# Patient Record
Sex: Female | Born: 1940
Health system: Southern US, Community
[De-identification: ages and names within clinical notes are randomized; demographics above are authoritative.]

## PROBLEM LIST (undated history)

## (undated) DIAGNOSIS — M25471 Effusion, right ankle: Secondary | ICD-10-CM

## (undated) DIAGNOSIS — E785 Hyperlipidemia, unspecified: Secondary | ICD-10-CM

## (undated) DIAGNOSIS — R011 Cardiac murmur, unspecified: Secondary | ICD-10-CM

## (undated) DIAGNOSIS — K219 Gastro-esophageal reflux disease without esophagitis: Secondary | ICD-10-CM

## (undated) DIAGNOSIS — IMO0002 Reserved for concepts with insufficient information to code with codable children: Secondary | ICD-10-CM

## (undated) DIAGNOSIS — IMO0001 Reserved for inherently not codable concepts without codable children: Secondary | ICD-10-CM

## (undated) DIAGNOSIS — R519 Headache, unspecified: Secondary | ICD-10-CM

## (undated) DIAGNOSIS — I679 Cerebrovascular disease, unspecified: Secondary | ICD-10-CM

## (undated) DIAGNOSIS — F32A Depression, unspecified: Secondary | ICD-10-CM

## (undated) DIAGNOSIS — I739 Peripheral vascular disease, unspecified: Secondary | ICD-10-CM

## (undated) DIAGNOSIS — I340 Nonrheumatic mitral (valve) insufficiency: Secondary | ICD-10-CM

## (undated) DIAGNOSIS — J302 Other seasonal allergic rhinitis: Secondary | ICD-10-CM

## (undated) DIAGNOSIS — I10 Essential (primary) hypertension: Secondary | ICD-10-CM

## (undated) DIAGNOSIS — R55 Syncope and collapse: Secondary | ICD-10-CM

## (undated) DIAGNOSIS — M549 Dorsalgia, unspecified: Secondary | ICD-10-CM

## (undated) DIAGNOSIS — Z9889 Other specified postprocedural states: Secondary | ICD-10-CM

## (undated) DIAGNOSIS — D649 Anemia, unspecified: Secondary | ICD-10-CM

## (undated) DIAGNOSIS — I809 Phlebitis and thrombophlebitis of unspecified site: Secondary | ICD-10-CM

## (undated) DIAGNOSIS — R51 Headache: Secondary | ICD-10-CM

## (undated) DIAGNOSIS — R42 Dizziness and giddiness: Secondary | ICD-10-CM

## (undated) DIAGNOSIS — B019 Varicella without complication: Secondary | ICD-10-CM

## (undated) DIAGNOSIS — M79606 Pain in leg, unspecified: Secondary | ICD-10-CM

## (undated) DIAGNOSIS — I82409 Acute embolism and thrombosis of unspecified deep veins of unspecified lower extremity: Secondary | ICD-10-CM

## (undated) DIAGNOSIS — I2510 Atherosclerotic heart disease of native coronary artery without angina pectoris: Secondary | ICD-10-CM

## (undated) DIAGNOSIS — R112 Nausea with vomiting, unspecified: Secondary | ICD-10-CM

## (undated) DIAGNOSIS — F419 Anxiety disorder, unspecified: Secondary | ICD-10-CM

## (undated) DIAGNOSIS — T7840XA Allergy, unspecified, initial encounter: Secondary | ICD-10-CM

## (undated) DIAGNOSIS — I6529 Occlusion and stenosis of unspecified carotid artery: Secondary | ICD-10-CM

## (undated) DIAGNOSIS — F329 Major depressive disorder, single episode, unspecified: Secondary | ICD-10-CM

## (undated) DIAGNOSIS — M25472 Effusion, left ankle: Secondary | ICD-10-CM

## (undated) HISTORY — DX: Peripheral vascular disease, unspecified: I73.9

## (undated) HISTORY — DX: Dorsalgia, unspecified: M54.9

## (undated) HISTORY — DX: Syncope and collapse: R55

## (undated) HISTORY — DX: Major depressive disorder, single episode, unspecified: F32.9

## (undated) HISTORY — DX: Allergy, unspecified, initial encounter: T78.40XA

## (undated) HISTORY — DX: Pain in leg, unspecified: M79.606

## (undated) HISTORY — DX: Atherosclerotic heart disease of native coronary artery without angina pectoris: I25.10

## (undated) HISTORY — DX: Reserved for concepts with insufficient information to code with codable children: IMO0002

## (undated) HISTORY — DX: Phlebitis and thrombophlebitis of unspecified site: I80.9

## (undated) HISTORY — DX: Hyperlipidemia, unspecified: E78.5

## (undated) HISTORY — DX: Acute embolism and thrombosis of unspecified deep veins of unspecified lower extremity: I82.409

## (undated) HISTORY — PX: CHOLECYSTECTOMY: SHX55

## (undated) HISTORY — PX: UPPER GASTROINTESTINAL ENDOSCOPY: SHX188

## (undated) HISTORY — PX: CARDIAC CATHETERIZATION: SHX172

## (undated) HISTORY — PX: UPPER GI ENDOSCOPY: SHX6162

## (undated) HISTORY — DX: Varicella without complication: B01.9

## (undated) HISTORY — DX: Dizziness and giddiness: R42

## (undated) HISTORY — DX: Anemia, unspecified: D64.9

## (undated) HISTORY — PX: BREAST SURGERY: SHX581

## (undated) HISTORY — DX: Occlusion and stenosis of unspecified carotid artery: I65.29

## (undated) HISTORY — DX: Cerebrovascular disease, unspecified: I67.9

## (undated) HISTORY — PX: APPENDECTOMY: SHX54

## (undated) HISTORY — DX: Essential (primary) hypertension: I10

## (undated) HISTORY — PX: TOTAL ABDOMINAL HYSTERECTOMY: SHX209

## (undated) HISTORY — DX: Depression, unspecified: F32.A

## (undated) HISTORY — PX: SEPTOPLASTY: SUR1290

## (undated) HISTORY — DX: Cardiac murmur, unspecified: R01.1

---

## 1990-08-31 HISTORY — PX: CAROTID ENDARTERECTOMY: SUR193

## 2003-06-12 ENCOUNTER — Encounter: Payer: Self-pay | Admitting: Cardiothoracic Surgery

## 2003-06-14 ENCOUNTER — Encounter: Payer: Self-pay | Admitting: Cardiothoracic Surgery

## 2003-06-14 ENCOUNTER — Inpatient Hospital Stay (HOSPITAL_COMMUNITY): Admission: RE | Admit: 2003-06-14 | Discharge: 2003-06-20 | Payer: Self-pay | Admitting: Cardiothoracic Surgery

## 2003-06-15 ENCOUNTER — Encounter: Payer: Self-pay | Admitting: Cardiothoracic Surgery

## 2003-06-16 ENCOUNTER — Encounter: Payer: Self-pay | Admitting: Cardiothoracic Surgery

## 2003-06-17 ENCOUNTER — Encounter: Payer: Self-pay | Admitting: Cardiothoracic Surgery

## 2003-06-18 ENCOUNTER — Encounter: Payer: Self-pay | Admitting: Surgery

## 2003-06-19 ENCOUNTER — Encounter: Payer: Self-pay | Admitting: Cardiothoracic Surgery

## 2003-07-06 ENCOUNTER — Encounter: Admission: RE | Admit: 2003-07-06 | Discharge: 2003-07-06 | Payer: Self-pay | Admitting: Cardiothoracic Surgery

## 2004-08-31 HISTORY — PX: CORONARY ARTERY BYPASS GRAFT: SHX141

## 2005-01-29 ENCOUNTER — Encounter: Admission: RE | Admit: 2005-01-29 | Discharge: 2005-01-29 | Payer: Self-pay | Admitting: Vascular Surgery

## 2005-02-04 ENCOUNTER — Ambulatory Visit (HOSPITAL_COMMUNITY): Admission: RE | Admit: 2005-02-04 | Discharge: 2005-02-04 | Payer: Self-pay | Admitting: Vascular Surgery

## 2005-02-13 ENCOUNTER — Inpatient Hospital Stay (HOSPITAL_COMMUNITY): Admission: RE | Admit: 2005-02-13 | Discharge: 2005-02-15 | Payer: Self-pay | Admitting: Vascular Surgery

## 2005-02-13 ENCOUNTER — Encounter (INDEPENDENT_AMBULATORY_CARE_PROVIDER_SITE_OTHER): Payer: Self-pay | Admitting: Specialist

## 2005-02-13 HISTORY — PX: CAROTID ENDARTERECTOMY: SUR193

## 2006-07-13 ENCOUNTER — Ambulatory Visit: Payer: Self-pay | Admitting: Ophthalmology

## 2007-04-18 ENCOUNTER — Ambulatory Visit: Payer: Self-pay | Admitting: Vascular Surgery

## 2007-09-07 ENCOUNTER — Ambulatory Visit (HOSPITAL_COMMUNITY): Admission: RE | Admit: 2007-09-07 | Discharge: 2007-09-07 | Payer: Self-pay | Admitting: *Deleted

## 2008-04-27 ENCOUNTER — Ambulatory Visit: Payer: Self-pay | Admitting: Vascular Surgery

## 2008-08-10 ENCOUNTER — Ambulatory Visit (HOSPITAL_BASED_OUTPATIENT_CLINIC_OR_DEPARTMENT_OTHER): Admission: RE | Admit: 2008-08-10 | Discharge: 2008-08-10 | Payer: Self-pay | Admitting: Otolaryngology

## 2009-03-18 ENCOUNTER — Ambulatory Visit: Payer: Self-pay

## 2009-05-03 ENCOUNTER — Ambulatory Visit: Payer: Self-pay | Admitting: Vascular Surgery

## 2009-09-16 ENCOUNTER — Emergency Department: Payer: Self-pay | Admitting: Emergency Medicine

## 2009-09-18 ENCOUNTER — Encounter: Payer: Self-pay | Admitting: Cardiovascular Disease

## 2009-10-08 ENCOUNTER — Ambulatory Visit: Payer: Self-pay | Admitting: Cardiovascular Disease

## 2009-10-08 DIAGNOSIS — Z8669 Personal history of other diseases of the nervous system and sense organs: Secondary | ICD-10-CM | POA: Insufficient documentation

## 2009-10-08 DIAGNOSIS — I739 Peripheral vascular disease, unspecified: Secondary | ICD-10-CM | POA: Insufficient documentation

## 2009-10-08 DIAGNOSIS — I2510 Atherosclerotic heart disease of native coronary artery without angina pectoris: Secondary | ICD-10-CM | POA: Insufficient documentation

## 2009-10-08 DIAGNOSIS — E785 Hyperlipidemia, unspecified: Secondary | ICD-10-CM | POA: Insufficient documentation

## 2009-10-08 DIAGNOSIS — I10 Essential (primary) hypertension: Secondary | ICD-10-CM | POA: Insufficient documentation

## 2009-10-08 DIAGNOSIS — I25810 Atherosclerosis of coronary artery bypass graft(s) without angina pectoris: Secondary | ICD-10-CM | POA: Insufficient documentation

## 2009-10-08 DIAGNOSIS — Z8672 Personal history of thrombophlebitis: Secondary | ICD-10-CM | POA: Insufficient documentation

## 2009-10-09 ENCOUNTER — Encounter: Payer: Self-pay | Admitting: Cardiovascular Disease

## 2009-11-05 ENCOUNTER — Ambulatory Visit: Payer: Self-pay | Admitting: Vascular Surgery

## 2010-02-13 ENCOUNTER — Encounter: Payer: Self-pay | Admitting: Cardiovascular Disease

## 2010-03-05 ENCOUNTER — Telehealth: Payer: Self-pay | Admitting: Cardiovascular Disease

## 2010-03-10 LAB — HM MAMMOGRAPHY: HM Mammogram: NORMAL

## 2010-04-21 ENCOUNTER — Ambulatory Visit: Payer: Self-pay | Admitting: Cardiovascular Disease

## 2010-06-03 ENCOUNTER — Ambulatory Visit: Payer: Self-pay | Admitting: Vascular Surgery

## 2010-09-02 ENCOUNTER — Encounter: Payer: Self-pay | Admitting: Cardiovascular Disease

## 2010-09-30 NOTE — Assessment & Plan Note (Signed)
Summary: f85m   Visit Type:  Follow-up Primary Provider:  Ddr. Loma Sender  CC:  F/U.  "Doing well.".  History of Present Illness: [name] is a very pleasant 70 year old woman with history coronary artery disease, bypass surgery, peripheral vascular disease with history of right carotid endarterectomy followed by Dr. Arbie Cookey, who presents for routine followup. Last catheterization in January 2009 showing patent vein grafts, atretic LIMA to the LAD.   Catherine Green reports that she has been doing well. She denies any significant chest pain. She has been exercising with a trainer in an effort to lose weight. She has been cutting back on her simvastatin from 40-20 mg daily to 2 malaise. Otherwise she is feeling well. She does report having some fatigue and wonders if her weight gain may be due to her medications  She states that she sees Dr.Early every 6 months and had a carotid ultrasound at that time.   EKG shows normal sinus rhythm with rate of 54 beats per minute, no significant ST or T wave changes.  She reports that her last cholesterol was 160 on simvastatin 40 mg daily. She has recently decreased her dose to 20 mg daily.   Current Medications (verified): 1)  Hydrochlorothiazide 50 Mg Tabs (Hydrochlorothiazide) .... Take One Tablet By Mouth Daily. 2)  Aspirin 81 Mg Tbec (Aspirin) .... Take 2 Tablets By Mouth Daily 3)  Furosemide 20 Mg Tabs (Furosemide) .... As Needed 4)  Alprazolam 0.5 Mg Tabs (Alprazolam) .Marland Kitchen.. 1 Tab By Mouth Two Times A Day As Needed 5)  Carvedilol 25 Mg Tabs (Carvedilol) .... 1/2  Tablet By Mouth Twice A Day 6)  Simvastatin 40 Mg Tabs (Simvastatin) .... Take One Tablet By Mouth Daily At Bedtime 7)  Lisinopril 5 Mg Tabs (Lisinopril) .Marland Kitchen.. 1 Tab By Mouth Once Daily 8)  Butalbital-Asa-Caffeine 50-325-40 Mg Caps (Butalbital-Aspirin-Caffeine) .... As Needed  Allergies (verified): 1)  ! Lipitor 2)  ! Crestor 3)  ! Oxycodone Hcl 4)  ! Codeine  Past  History:  Past Medical History: Last updated: Nov 02, 2009 hyperlipidemia PVD- endarterectomy by Dr. Arbie Cookey CAD s/p bypass hypertension presyncope hx thrombophlebitis following childbirth  Extracranial cerebrovascular occlusive disease  Past Surgical History: Last updated: 11-02-09 endarterectomy by Dr. Arbie Cookey redo 2006 bypass - Dr. Donata Clay 2006 Septoplasty with bilateral inferior turbinate reductions Total abdominal hysterectomy Appendectomy  Family History: Last updated: 2009-11-02 Father:Deceased at 60 years old of heart disease Mother: Deceased 73 years old aneurysm on the brain  Brother: Deceased at 23 years old heart disease. Family History of Hypertension:   Social History: Last updated: 11/02/09 Retired  Married  Tobacco Use - Former.  Alcohol Use - no Regular Exercise - yes Drug Use - no  Risk Factors: Alcohol Use: 0 (11-02-09) Caffeine Use: 1 cup of coffee a day (2009-11-02) Exercise: yes (11/02/09)  Risk Factors: Smoking Status: quit (11/02/2009)  Review of Systems       The patient complains of weight gain.  The patient denies fever, weight loss, vision loss, decreased hearing, hoarseness, chest pain, syncope, dyspnea on exertion, peripheral edema, prolonged cough, abdominal pain, incontinence, muscle weakness, depression, and enlarged lymph nodes.         Fatigue,   Vital Signs:  Patient profile:   70 year old female Height:      62 inches Weight:      158 pounds BMI:     29.00 Pulse rate:   52 / minute BP sitting:   164 / 76  (left  arm) Cuff size:   regular  Vitals Entered By: Bishop Dublin, CMA (April 21, 2010 11:25 AM)  Physical Exam  General:  well-appearing woman in no apparent distress. HEENT exam is benign, oropharynx is clear. Neck is supple with no JVP. 2+ carotid bruit on the right. Heart sounds are regular with S1-S2 and no murmurs appreciated. lungs are clear to auscultation with no wheezes or rales. Abdominal exam is  benign. No significant lower extremity edema. Pulses are equal and symmetrical in her upper and lower extremities. Skin is warm and dry. Neurologic exam is nonfocal.   Impression & Recommendations:  Problem # 1:  CAD, ARTERY BYPASS GRAFT (ICD-414.04) no symptoms concerning for angina. No testing scheduled at this time. We have asked her to contact us if she has any worsening shortness of breath or chest discomfort. We will continue aggressive medical management.  Her updated medication list for this problem includes:    Aspirin 81 Mg Tbec (Aspirin) .Marland Kitchen... Take 2 tablets by mouth daily    Carvedilol 6.25 Mg Tabs (Carvedilol) .Marland Kitchen... Take 1 tablet by mouth two times a day    Lisinopril 5 Mg Tabs (Lisinopril) .Marland Kitchen... 1 tab by mouth once daily  Problem # 2:  HYPERLIPIDEMIA-MIXED (ICD-272.4) Her cholesterol continues to be elevated above goal. She has even decreased her dose from 40 to 20 mg daily. We have asked her to retry the 40 mg dose.  Her updated medication list for this problem includes:    Simvastatin 40 Mg Tabs (Simvastatin) .Marland Kitchen... Take one tablet by mouth daily at bedtime  Problem # 3:  HYPERTENSION (ICD-401.9) Blood pressure is well controlled on her current medication regimen. she does have fatigue and some bradycardia. We will decrease her Coreg to 6.25 mg b.i.d.  Her updated medication list for this problem includes:    Hydrochlorothiazide 50 Mg Tabs (Hydrochlorothiazide) .Marland Kitchen... Take one tablet by mouth daily.    Aspirin 81 Mg Tbec (Aspirin) .Marland Kitchen... Take 2 tablets by mouth daily    Furosemide 20 Mg Tabs (Furosemide) .Marland Kitchen... As needed    Carvedilol 6.25 Mg Tabs (Carvedilol) .Marland Kitchen... Take 1 tablet by mouth two times a day    Lisinopril 5 Mg Tabs (Lisinopril) .Marland Kitchen... 1 tab by mouth once daily  Problem # 4:  PVD (ICD-443.9) Carotid arterial disease followed by Dr. Arbie Cookey. Bruit audible on the right though this may be postoperative changes.  Patient Instructions: 1)  Your physician has  recommended you make the following change in your medication: START carvedilol 6.25 two times a day  STOP carvedilol 25mg  2)  Your physician wants you to follow-up in:  6 months  You will receive a reminder letter in the mail two months in advance. If you don't receive a letter, please call our office to schedule the follow-up appointment. Prescriptions: CARVEDILOL 6.25 MG TABS (CARVEDILOL) Take 1 tablet by mouth two times a day  #180 x 4   Entered by:   Benedict Needy, RN   Authorized by:   Dossie Arbour MD   Signed by:   Benedict Needy, RN on 04/21/2010   Method used:   Electronically to        [facility] [phone]* (retail)       [address] [place], [place]  [address]       Ph: [phone] or [phone]       Fax: [phone]   RxID:  [id number]  

## 2010-09-30 NOTE — Progress Notes (Signed)
Summary: Dental procedure  Phone Note Call from Patient   Reason for Call: Talk to Doctor Summary of Call: Would like to know if needs pre medication for dental work (crown)? Initial call taken by: Bishop Dublin, CMA,  March 05, 2010 1:46 PM  Follow-up for Phone Call        not needed. Only if she has a metal valve, congenital heart problem. She is ok.     Appended Document: Dental procedure LMOM no need for pre medication prior to dental work.

## 2010-09-30 NOTE — Assessment & Plan Note (Signed)
Summary: NP6/AMD   Visit Type:  new patient Primary Provider:  Ddr. Loma Sender  CC:  SOB, swelling in ankles, low blood pressure, lightheaded, and dizziness.  History of Present Illness: [name] is a very pleasant 70 year old woman with history coronary artery disease, bypass surgery, peripheral vascular disease with history of right carotid endarterectomy followed by Dr. Arbie Cookey, who presents for routine followup. Last catheterization in January 2009 showing patent vein grafts, atretic LIMA to the LAD.   Catherine Green states that she has been doing well. She is tired chronically. She went to the emergency room in January for a severe headache. Her blood pressure was very elevated at that time. They started her on lisinopril 5 mg daily. Since then she has felt more fatigued, poor energy and unable to be as active as she normally is.  She states that she sees Dr.Early every 6 months and had a carotid ultrasound at that time. She states her heart rate has also been low when she checks it at home.  Preventive Screening-Counseling & Management  Alcohol-Tobacco     Alcohol drinks/day: 0     Smoking Status: quit     Year Quit: 2004  Caffeine-Diet-Exercise     Caffeine use/day: 1 cup of coffee a day     Does Patient Exercise: yes     Type of exercise: walking      Drug Use:  no.    Current Problems (verified): 1)  Thrombophlebitis, Hx of  (ICD-V12.52) 2)  Syncope, Hx of  (ICD-V12.49) 3)  Hypertension  (ICD-401.9) 4)  Hyperlipidemia  (ICD-272.4) 5)  Pvd  (ICD-443.9) 6)  Cad, Autologous Bypass Graft  (ICD-414.02) 7)  Cad, Unspecified Site  (ICD-414.00)  Current Medications (verified): 1)  Hydrochlorothiazide 50 Mg Tabs (Hydrochlorothiazide) .... Take One Tablet By Mouth Daily. 2)  Aspirin Ec 325 Mg Tbec (Aspirin) .... Take One Tablet By Mouth Daily 3)  Furosemide 20 Mg Tabs (Furosemide) .... As Needed 4)  Alprazolam 0.5 Mg Tabs (Alprazolam) .Marland Kitchen.. 1 Tab By Mouth Two Times A  Day As Needed 5)  Carvedilol 12.5 Mg Tabs (Carvedilol) .... Take 1/2  Tablet By Mouth Twice A Day 6)  Simvastatin 40 Mg Tabs (Simvastatin) .... Take One Tablet By Mouth Daily At Bedtime 7)  Lisinopril 5 Mg Tabs (Lisinopril) .Marland Kitchen.. 1 Tab By Mouth Once Daily 8)  Butalbital-Asa-Caffeine 50-325-40 Mg Caps (Butalbital-Aspirin-Caffeine) .... As Needed  Allergies (verified): 1)  ! Lipitor 2)  ! Crestor 3)  ! Oxycodone Hcl 4)  ! Codeine  Past History:  Past Medical History: Last updated: 11-02-09 hyperlipidemia PVD- endarterectomy by Dr. Arbie Cookey CAD s/p bypass hypertension presyncope hx thrombophlebitis following childbirth  Extracranial cerebrovascular occlusive disease  Past Surgical History: Last updated: 2009/11/02 endarterectomy by Dr. Arbie Cookey redo 2006 bypass - Dr. Donata Clay 2006 Septoplasty with bilateral inferior turbinate reductions Total abdominal hysterectomy Appendectomy  Family History: Last updated: 11/02/09 Father:Deceased at 44 years old of heart disease Mother: Deceased 41 years old aneurysm on the brain  Brother: Deceased at 70 years old heart disease. Family History of Hypertension:   Social History: Last updated: 2009/11/02 Retired  Married  Tobacco Use - Former.  Alcohol Use - no Regular Exercise - yes Drug Use - no  Risk Factors: Alcohol Use: 0 (11/02/2009) Caffeine Use: 1 cup of coffee a day (Nov 02, 2009) Exercise: yes (Nov 02, 2009)  Risk Factors: Smoking Status: quit (2009/11/02)  Family History: Father:Deceased at 28 years old of heart disease Mother: Deceased 51 years old aneurysm on  the brain  Brother: Deceased at 25 years old heart disease. Family History of Hypertension:   Social History: Retired  Married  Tobacco Use - Former.  Alcohol Use - no Regular Exercise - yes Drug Use - no Alcohol drinks/day:  0 Smoking Status:  quit Caffeine use/day:  1 cup of coffee a day Does Patient Exercise:  yes Drug Use:  no  Review of  Systems  The patient denies anorexia, fever, weight loss, weight gain, vision loss, decreased hearing, hoarseness, chest pain, syncope, dyspnea on exertion, peripheral edema, prolonged cough, headaches, hemoptysis, abdominal pain, melena, hematochezia, severe indigestion/heartburn, hematuria, incontinence, genital sores, muscle weakness, suspicious skin lesions, transient blindness, difficulty walking, depression, unusual weight change, abnormal bleeding, enlarged lymph nodes, angioedema, breast masses, and testicular masses.         Tired  Physical Exam  General:  well-appearing woman in no apparent distress. H. EENT exam is benign, oropharynx is clear. Neck is supple with no JVP. 2+ carotid bruit on the right. Heart sounds are regular with S1-S2 and no murmurs appreciated. lungs are clear to auscultation with no wheezes or rales. Abdominal exam is benign. No significant lower extremity edema. Pulses are equal and symmetrical in her upper and lower extremities. Skin is warm and dry. Neurologic exam is nonfocal.    EKG  Procedure date:  10/08/2009  Findings:      EKG shows normal sinus rhythm with rate of 59 beats per minute, no significant ST or T wave changes. No significant changes from previous EKG.  Impression & Recommendations:  Problem # 1:  CAD, ARTERY BYPASS GRAFT (ICD-414.04) last catheterization in January 2009 that showed patent grafts. She does have an atretic LIMA to the LAD. No symptoms at this time of shortness of breath or chest pain. We'll continue current medications. I've asked that she decrease her aspirin from 325 mg daily 281 mg x2. Her updated medication list for this problem includes:    Aspirin 81 Mg Tbec (Aspirin) .Marland Kitchen... Take 2 tablets by mouth daily    Carvedilol 12.5 Mg Tabs (Carvedilol) .Marland Kitchen... Take 1/2  tablet by mouth twice a day    Lisinopril 5 Mg Tabs (Lisinopril) .Marland Kitchen... 1 tab by mouth once daily  Problem # 2:  HYPERLIPIDEMIA-MIXED (ICD-272.4) we'll try to  obtain her most recent lipid panel from Dr. Loma Sender. She states that she is due to have a repeat check of her cholesterol in several weeks' time. Her updated medication list for this problem includes:    Simvastatin 40 Mg Tabs (Simvastatin) .Marland Kitchen... Take one tablet by mouth daily at bedtime  Problem # 3:  HYPERTENSION (ICD-401.9) she is hypotensive today and feeling very fatigued. We have asked that she stop her lisinopril and monitor her blood pressure at home. She continues to be hypotensive, we could we could cut her HCTZ in half. Her updated medication list for this problem includes:    Hydrochlorothiazide 50 Mg Tabs (Hydrochlorothiazide) .Marland Kitchen... Take one tablet by mouth daily.    Aspirin 81 Mg Tbec (Aspirin) .Marland Kitchen... Take 2 tablets by mouth daily    Furosemide 20 Mg Tabs (Furosemide) .Marland Kitchen... As needed    Carvedilol 12.5 Mg Tabs (Carvedilol) .Marland Kitchen... Take 1/2  tablet by mouth twice a day    Lisinopril 5 Mg Tabs (Lisinopril) .Marland Kitchen... 1 tab by mouth once daily

## 2010-09-30 NOTE — Letter (Signed)
Summary: Internal Correspondence  Internal Correspondence   Imported By: [place] 10/09/2009 12:37:37  _____________________________________________________________________  External Attachment:    Type:   Image     Comment:   External Document

## 2010-10-01 ENCOUNTER — Ambulatory Visit (INDEPENDENT_AMBULATORY_CARE_PROVIDER_SITE_OTHER): Payer: PRIVATE HEALTH INSURANCE | Admitting: Cardiovascular Disease

## 2010-10-01 ENCOUNTER — Ambulatory Visit: Admit: 2010-10-01 | Payer: Self-pay | Admitting: Cardiovascular Disease

## 2010-10-01 ENCOUNTER — Encounter: Payer: Self-pay | Admitting: Cardiovascular Disease

## 2010-10-01 DIAGNOSIS — I10 Essential (primary) hypertension: Secondary | ICD-10-CM

## 2010-10-01 DIAGNOSIS — E785 Hyperlipidemia, unspecified: Secondary | ICD-10-CM

## 2010-10-01 DIAGNOSIS — I739 Peripheral vascular disease, unspecified: Secondary | ICD-10-CM

## 2010-10-01 DIAGNOSIS — I2510 Atherosclerotic heart disease of native coronary artery without angina pectoris: Secondary | ICD-10-CM

## 2010-10-08 NOTE — Assessment & Plan Note (Signed)
Summary: F/U 6 MONTHS/SAB   Vital Signs:  Patient profile:   70 year old female Height:      62 inches Weight:      159 pounds BMI:     29.19 Pulse rate:   56 / minute BP sitting:   151 / 74  (left arm) Cuff size:   regular  Vitals Entered By: Bishop Dublin, CMA (October 01, 2010 4:16 PM)  Visit Type:  Follow-up Primary Provider:  Ddr. Loma Sender  CC:  c/o gums bleeding with some nose bleedes since started on the aspirin 81mg  two daily. Denies chest pain or shortness of breath..  History of Present Illness: [name] is a very pleasant 70 year old woman with history coronary artery disease, bypass surgery, peripheral vascular disease with history of right carotid endarterectomy x2 followed by Dr. Arbie Cookey, who presents for routine followup. Last catheterization in January 2009 showing patent vein grafts, atretic LIMA to the LAD.   she reports that she is tolerating simvastatin 40 mg daily and taking it every day though her total cholesterol is greater than 200. She does have some muscle aches. Previous total cholesterol per her report was 160s. She is uncertain why it has climbed so high.  She states that she sees Dr.Early every 6 months and had a carotid ultrasound at that time.   EKG shows normal sinus rhythm with rate of 57 beats per minute with no significant ST or T wave changes  Current Medications (verified): 1)  Hydrochlorothiazide 50 Mg Tabs (Hydrochlorothiazide) .... Take One Tablet By Mouth Daily. 2)  Aspirin 81 Mg Tbec (Aspirin) .... Take 2 Tablets By Mouth Daily 3)  Furosemide 20 Mg Tabs (Furosemide) .... As Needed 4)  Carvedilol 6.25 Mg Tabs (Carvedilol) .... Take 1 Tablet By Mouth Two Times A Day 5)  Simvastatin 40 Mg Tabs (Simvastatin) .... Take One Tablet By Mouth Daily At Bedtime 6)  Tranxene-T 7.5 Mg Tabs (Clorazepate Dipotassium) .... One Tablet Once Daily  Allergies (verified): 1)  ! Lipitor 2)  ! Crestor 3)  ! Oxycodone Hcl 4)  !  Codeine  Past History:  Past Medical History: Last updated: 10/18/09 hyperlipidemia PVD- endarterectomy by Dr. Arbie Cookey CAD s/p bypass hypertension presyncope hx thrombophlebitis following childbirth  Extracranial cerebrovascular occlusive disease  Past Surgical History: Last updated: October 18, 2009 endarterectomy by Dr. Arbie Cookey redo 2006 bypass - Dr. Donata Clay 2006 Septoplasty with bilateral inferior turbinate reductions Total abdominal hysterectomy Appendectomy  Family History: Last updated: 2009-10-18 Father:Deceased at 72 years old of heart disease Mother: Deceased 61 years old aneurysm on the brain  Brother: Deceased at 63 years old heart disease. Family History of Hypertension:   Social History: Last updated: 10-18-09 Retired  Married  Tobacco Use - Former.  Alcohol Use - no Regular Exercise - yes Drug Use - no  Risk Factors: Alcohol Use: 0 (October 18, 2009) Caffeine Use: 1 cup of coffee a day (10-18-09) Exercise: yes (10/18/2009)  Risk Factors: Smoking Status: quit (10-18-2009)  Review of Systems  The patient denies fever, weight loss, weight gain, vision loss, decreased hearing, hoarseness, chest pain, syncope, dyspnea on exertion, peripheral edema, prolonged cough, abdominal pain, incontinence, muscle weakness, depression, and enlarged lymph nodes.         muscle ache that is mild, some cramping  Physical Exam  General:  well-appearing woman in no apparent distress. HEENT exam is benign, oropharynx is clear. Neck is supple with no JVP. 2+ carotid bruit on the right. Heart sounds are regular with S1-S2  and no murmurs appreciated. lungs are clear to auscultation with no wheezes or rales. Abdominal exam is benign. No significant lower extremity edema. Pulses are equal and symmetrical in her upper and lower extremities. Skin is warm and dry. Neurologic exam is nonfocal. Skin:  Intact without lesions or rashes. Psych:  Normal affect.   Impression &  Recommendations:  Problem # 1:  CAD, ARTERY BYPASS GRAFT (ICD-414.04) history of coronary artery disease, bypass. No symptoms of angina at this time. We will continue medical management.  The following medications were removed from the medication list:    Lisinopril 5 Mg Tabs (Lisinopril) .Marland Kitchen... 1 tab by mouth once daily Her updated medication list for this problem includes:    Aspirin 81 Mg Tbec (Aspirin) .Marland Kitchen... Take 2 tablets by mouth daily    Carvedilol 6.25 Mg Tabs (Carvedilol) .Marland Kitchen... Take 1 tablet by mouth two times a day  Problem # 2:  HYPERLIPIDEMIA-MIXED (ICD-272.4) We had a long discussion with her about her cholesterol. We will try to obtain the most recent lipids from Dr. Vear Clock. She reports total cholesterol greater than 200 on simvastatin 40 mg daily. If this is the case which we will confirm, we will change her to Crestor 20 mg daily titrating to 40 mg daily. She does report having muscle aches on various medications, GI upset on zetia.  Given her bypass and peripheral vascular disease, we will need to continue aggressive medical management.  Her updated medication list for this problem includes:    Crestor 40 Mg Tabs (Rosuvastatin calcium) .Marland Kitchen... Take one tablet by mouth daily.  Problem # 3:  HYPERTENSION (ICD-401.9) She is reporting adequate blood pressures at home with systolics in the 130s. I've asked her to monitor her blood pressure and contact us for systolic pressures in the 140s.  The following medications were removed from the medication list:    Lisinopril 5 Mg Tabs (Lisinopril) .Marland Kitchen... 1 tab by mouth once daily Her updated medication list for this problem includes:    Hydrochlorothiazide 50 Mg Tabs (Hydrochlorothiazide) .Marland Kitchen... Take one tablet by mouth daily.    Aspirin 81 Mg Tbec (Aspirin) .Marland Kitchen... Take 2 tablets by mouth daily    Furosemide 20 Mg Tabs (Furosemide) .Marland Kitchen... As needed    Carvedilol 6.25 Mg Tabs (Carvedilol) .Marland Kitchen... Take 1 tablet by mouth two times a  day  Problem # 4:  PVD (ICD-443.9) Severe carotid arterial disease followed in [place], recent carotid ultrasound done in [place]. Per her report, and no significant restenosis.  Other Orders: EKG w/ Interpretation (93000)  Patient Instructions: 1)  Your physician recommends that you schedule a follow-up appointment in: 6 months 2)  Your physician has recommended you make the following change in your medication: START Crestor 40mg  once daily.   Orders Added: 1)  EKG w/ Interpretation [93000]

## 2010-10-15 ENCOUNTER — Other Ambulatory Visit: Payer: Self-pay | Admitting: Dermatology

## 2010-11-03 ENCOUNTER — Telehealth: Payer: Self-pay | Admitting: Cardiovascular Disease

## 2010-11-08 ENCOUNTER — Encounter: Payer: Self-pay | Admitting: Cardiovascular Disease

## 2010-11-11 NOTE — Progress Notes (Addendum)
Summary: Crestor side effects  Phone Note Call from Patient Call back at Home Phone [phone]   Caller: Self Call For: Catherine Green Summary of Call: Pt started Crestor 3 weeks ago at 40 mg.  Pt c/o upset stomach, weakness, loss of appetite.  Pt stopped the medication on Friday and has shown some improvement. Initial call taken by: Harlon Flor,  November 03, 2010 8:03 AM  Follow-up for Phone Call        Please advise.Lanny Hurst RN  November 03, 2010 11:51 AM   Additional Follow-up for Phone Call Additional follow up Details #1::        Could give it a few days off, retry at 1/2 dose for a while and see if better. Would be a good way to  makw sure it is not secondary to GI bug     Appended Document: Crestor side effects Spoke to pt's husband, gave him Dr. Windell Hummingbird recommendation as above, he states pt is still off Crestor and she has had this same problem with Lipitor in the past and thinks it is the Crestor, due to her having the same symptoms before. Pt is taking her Simvastatin in the meantime.   Appended Document: Crestor side effects LDL in 01/2010 was 87. Ideal is <70 Could add zetia 10 mg daily to get numbers lower  Appended Document: Crestor side effects Spoke to pt, she states she has taken Zetia in the past and it causes same symptoms. Notified pt that is about all we can do as far as medication. Pt states she was not in her "normal routine" when labs were drawn and she thinks she can work on diet/exercise to bring levels down. Scheduled lipid/lft for 12/2010.

## 2010-12-02 ENCOUNTER — Other Ambulatory Visit (INDEPENDENT_AMBULATORY_CARE_PROVIDER_SITE_OTHER): Payer: PRIVATE HEALTH INSURANCE

## 2010-12-02 DIAGNOSIS — I6529 Occlusion and stenosis of unspecified carotid artery: Secondary | ICD-10-CM

## 2010-12-02 DIAGNOSIS — Z48812 Encounter for surgical aftercare following surgery on the circulatory system: Secondary | ICD-10-CM

## 2010-12-04 NOTE — Procedures (Unsigned)
CAROTID DUPLEX EXAM  INDICATION:  Follow up carotid stenosis.  HISTORY: Diabetes:  No. Cardiac:  CABG. Hypertension:  Yes. Smoking:  Previous. Previous Surgery:  Right carotid endarterectomy with Dacron patch on 04/21/1991.  Also, resection and right carotid endarterectomy re-do on 02/13/2005. CV History:  Vision disturbances. Amaurosis Fugax No, Paresthesias No, Hemiparesis No.                                      RIGHT             LEFT Brachial systolic pressure:         152               138 Brachial Doppler waveforms:         WNL               WNL Vertebral direction of flow:        Antegrade         Antegrade DUPLEX VELOCITIES (cm/sec) CCA peak systolic                   60                104 ECA peak systolic                   96, proximal occlusion              126 ICA peak systolic                   46                245 ICA end diastolic                   13                50 PLAQUE MORPHOLOGY:                  Heterogenous      Heterogenous PLAQUE AMOUNT:                      Mild              Moderate PLAQUE LOCATION:                    CCA               CCA, ICA  IMPRESSION: 1. Aneurysmal dilatation of the mid common carotid artery measuring     1.17 cm X 1.27 cm. 2. Diameter measurements proximal and distal to dilatation range from     0.71 cm to 0.78 cm. 3. Right external carotid artery proximally occluded with retrograde     flow noted distal to occluded segment. 4. Patent right internal carotid artery with history of resection and     carotid endarterectomy. 5. Left internal carotid artery stenosis estimated in the 40% to 59%     range.  This is unchanged since the study on 06/03/2010.  ___________________________________________ Larina Earthly, M.D.  SH/MEDQ  D:  12/02/2010  T:  12/02/2010  Job:  [id number]

## 2011-01-06 ENCOUNTER — Other Ambulatory Visit (INDEPENDENT_AMBULATORY_CARE_PROVIDER_SITE_OTHER): Payer: PRIVATE HEALTH INSURANCE | Admitting: *Deleted

## 2011-01-06 DIAGNOSIS — E785 Hyperlipidemia, unspecified: Secondary | ICD-10-CM

## 2011-01-07 LAB — LIPID PANEL
Cholesterol: 285 mg/dL — ABNORMAL HIGH (ref 0–200)
HDL: 51 mg/dL (ref >39)
LDL Cholesterol: 185 mg/dL — ABNORMAL HIGH (ref 0–99)
Total CHOL/HDL Ratio: 5.6 Ratio
Triglycerides: 245 mg/dL — ABNORMAL HIGH (ref <150)
VLDL: 49 mg/dL — ABNORMAL HIGH (ref 0–40)

## 2011-01-07 LAB — HEPATIC FUNCTION PANEL
ALT: 24 U/L (ref 0–35)
AST: 19 U/L (ref 0–37)
Albumin: 4.8 g/dL (ref 3.5–5.2)
Alkaline Phosphatase: 85 U/L (ref 39–117)
Bilirubin, Direct: 0.1 mg/dL (ref 0.0–0.3)
Indirect Bilirubin: 0.4 mg/dL (ref 0.0–0.9)
Total Bilirubin: 0.5 mg/dL (ref 0.3–1.2)
Total Protein: 7.4 g/dL (ref 6.0–8.3)

## 2011-01-13 ENCOUNTER — Encounter: Payer: Self-pay | Admitting: Cardiovascular Disease

## 2011-01-13 NOTE — Procedures (Signed)
CAROTID DUPLEX EXAM   INDICATION:  Follow up carotid artery disease.   HISTORY:  Diabetes:  No.  Cardiac:  CABG.  Hypertension:  Yes.  Smoking:  Previous.  Previous Surgery:  Right CEA with DPA in 1992.  Repair of right carotid  pseudoaneurysm with VPA 02/13/05 by Dr. Arbie Cookey.  CV History:  Asymptomatic.  Amaurosis Fugax No, Paresthesias No, Hemiparesis No.                                       RIGHT             LEFT  Brachial systolic pressure:         166               170  Brachial Doppler waveforms:         WNL               WNL  Vertebral direction of flow:        Antegrade         Antegrade  DUPLEX VELOCITIES (cm/sec)  CCA peak systolic                   111               M = 107, D =154  ECA peak systolic                   75 (proximal occlusion)             122  ICA peak systolic                   107               235  ICA end diastolic                   29                60  PLAQUE MORPHOLOGY:                  Homogenous        Calcific  PLAQUE AMOUNT:                      Mild              Moderate/severe  PLAQUE LOCATION:                    Bifurcation/ICA   Bifurcation/ICA   IMPRESSION:  1. Right internal carotid artery shows evidence of 20-39% stenosis.  2. Left internal carotid artery shows evidence of 60-79% stenosis.  3. Left stenosis appears focal from the bifurcation extending into the      very proximal internal carotid artery.  4. Left external carotid artery proximal occlusion with distal filling      via branches.   ___________________________________________  Larina Earthly, M.D.   AS/MEDQ  D:  11/05/2009  T:  11/05/2009  Job:  [id number]

## 2011-01-13 NOTE — Procedures (Signed)
CAROTID DUPLEX EXAM   INDICATION:  Followup evaluation of known carotid artery disease.   HISTORY:  Diabetes:  No.  Cardiac:  Coronary artery bypass graft in 2004.  Hypertension:  Yes.  Smoking:  Quit over 15 years ago.  Previous Surgery:  Right carotid endarterectomy with Dacron patch  angioplasty in 1992.  Repair of right carotid artery pseudoaneurysm with  vein patch angioplasty on 02/13/2005.  CV History:  Previous duplex on 04/18/2007 revealed a 20-39% right ICA  stenosis status post endarterectomy and a 40-59% left ICA stenosis.  Previous study also revealed an occluded right ECA which filled distally  via collaterals.  Amaurosis Fugax No, Paresthesias No, Hemiparesis No                                       RIGHT             LEFT  Brachial systolic pressure:         176               176  Brachial Doppler waveforms:         Triphasic         Triphasic  Vertebral direction of flow:        Antegrade         Antegrade  DUPLEX VELOCITIES (cm/sec)  CCA peak systolic                   95                85  ECA peak systolic                   *70               90  ICA peak systolic                   70                245  ICA end diastolic                   23                55  PLAQUE MORPHOLOGY:                  Soft              Calcified  PLAQUE AMOUNT:                      Mild to moderate  Moderate  PLAQUE LOCATION:                    Proximal ICA      Proximal ICA     IMPRESSION:  1. 20-39% right ICA stenosis status post endarterectomy.  2. *Right ECA fills distally via collaterals.  3. 40-59% left ICA stenosis (high end of range).  4. No significant change from previous study performed 04/18/2007.   ___________________________________________  Larina Earthly, M.D.   MC/MEDQ  D:  04/27/2008  T:  04/27/2008  Job:  [id number]

## 2011-01-13 NOTE — Cardiovascular Report (Signed)
NAMESORAYAH, SCHRODT NO.:  [id number]   MEDICAL RECORD NO.:  [id number]          PATIENT TYPE:  OIB   LOCATION:  2899                         FACILITY:  [facility]   PHYSICIAN:  Darlin Priestly, MD  DATE OF BIRTH:  1941-03-02   DATE OF PROCEDURE:  09/07/2007  DATE OF DISCHARGE:                            CARDIAC CATHETERIZATION   PROCEDURES:  1. Left heart catheterization.  2. Coronary angiography.  3. Left ventriculogram.  4. Saphenous vein graft angiography.  5. Left internal mammary angiography.  6. Abdominal aorta aortogram.   COMPLICATIONS:  None.   INDICATIONS:  The patient is a 70 year old female patient Dr. Loma Sender in [place] with a history of known CAD status post bypass  surgery in 2004 secondary to significant left main disease.  Bypass  consisted a LIMA to the LAD, vein graft to diagonal, vein graft to OM.  She is also noted to have right carotid aneurysm which underwent right  carotid endarterectomy at that time.  She recently underwent screening  Cardiolite scan as follow-up for her bypass surgery, which revealed  anterior wall ischemia with ST changes.  She is now brought back for  repeat catheterization to reassess her graft status.   PROCEDURE IN DETAIL:  After informed written, consent the patient was a  cardiac cath lab.  Right groin was shaved, prepped and usual sterile  fashion.  She was monitored through procedure.  A modified Seldinger  technique, number 6-French arterial sheath to right femoral artery.  A 6-  [place] diagnostic catheter was used to perform diagnostic angiography.   Left main is calcified with 8% ostial disease.   LAD is a medium-size vessel which courses the apex and gives rise to 1  diagonal branch.  The LAD fills competitively in its mid and distal  segments.  There is a LIMA which inserts into the midportion of the LAD.  The IMA is small but appears patent with competitive flow into the IMA.   The  first diagonal is a medium-size vessel which is filled via a patent  saphenous vein graft.  There is no significant disease in the body of  the graft, distal to graft insertion.   Left coronary artery also shows small ramus intermedius with no disease.   Left circumflex medium-size vessel coursing in the AV groove gives rise  to just 1 obtuse marginal branch.  The AV groove and circumflex is noted  to have a 90% stenosis in the midsegment prior to take-off of the first  OM.   There is a patent vein graft which inserts into the midportion of the  obtuse marginal.  There is no significant disease in the body of the  graft or distal graft insertion.   The right coronary is medium-size vessel which is dominant and gives off  the PDA as well as posterolateral branch.  There is mild 30% proximal  and 20% distal narrowing.  The PDA as posterolateral branch have no  significant disease.   Left ventriculogram reveals a preserved EF of 60%.   Abdominal aortogram reveals 30% left renal artery  stenosis at the  ostium.   HEMODYNAMIC RESULTS:  Systemic arterial pressure were 195/79, LV system  pressure 195/80, LVEDP of 27.   CONCLUSION:  1. Significant left main and one-vessel CAD.  2. Patent but small left internal mammary artery to the left anterior      descending.  3. Patent vein graft to diagonal with no significant disease in the      body of the graft to distal graft insertion.  4. Patent vein graft to obtuse marginal with no significant disease in      the body of the graft or distal to graft insertion.  5. Normal left ventricular systolic function.  6. Mild left renal artery stenosis.  7. Systemic hypertension.  8. Elevated left ventricular end-diastolic pressure.      Darlin Priestly, MD  Electronically Signed     RHM/MEDQ  D:  09/07/2007  T:  09/07/2007  Job:  [id number]   cc:   Loma Sender

## 2011-01-13 NOTE — Procedures (Signed)
CAROTID DUPLEX EXAM   INDICATION:  Followup evaluation of carotid artery disease.   HISTORY:  Diabetes:  No  Cardiac:  Coronary artery bypass graft in 2004  Hypertension:  Yes  Smoking:  Quit 16 years ago  Previous Surgery:  Right carotid endarterectomy with Dacron patch  angioplasty in 1992 and repair of right coronary artery pseudoaneurysm  with vein patch angioplasty of  carotid endarterectomy set on February 13, 2005.  CV History:  Patient reports periodic bilateral arm numbness, which is  attributed to certain positions of the arms.  Amaurosis Fugax No, Paresthesias No, Hemiparesis No.                                       RIGHT             LEFT  Brachial systolic pressure:         156               158  Brachial Doppler waveforms:         Triphasic         Triphasic  Vertebral direction of flow:        Antegrade         Antegrade  DUPLEX VELOCITIES (cm/sec)  CCA peak systolic                   67                97  ECA peak systolic                   *55               122  ICA peak systolic                   57                194  ICA end diastolic                   16                51  PLAQUE MORPHOLOGY:                  Calcified         Calcified  PLAQUE AMOUNT:                      Mild              Moderate  PLAQUE LOCATION:                    Proximal ICA      Proximal ICA   IMPRESSION:  20% to 39% right ICA stenosis status post endarterectomy.   A 40% to 59% left ICA stenosis.   *Right external carotid artery is occluded proximally but fills distally  via collaterals.   ___________________________________________  Larina Earthly, M.D.   MC/MEDQ  D:  04/18/2007  T:  04/19/2007  Job:  [id number]

## 2011-01-13 NOTE — Op Note (Signed)
NAMEALANEA, Catherine Green                ACCOUNT NO.:  [id number]   MEDICAL RECORD NO.:  [id number]          PATIENT TYPE:  AMB   LOCATION:  DSC                          FACILITY:  [facility]   PHYSICIAN:  Christopher E. Ezzard Standing, M.D.DATE OF BIRTH:  08-31-1941   DATE OF PROCEDURE:  08/10/2008  DATE OF DISCHARGE:                               OPERATIVE REPORT   PREOPERATIVE DIAGNOSIS:  Septal deviation to the left with nasal  obstruction.   POSTOPERATIVE DIAGNOSIS:  Septal deviation to the left with nasal  obstruction.   OPERATION PERFORMED:  Septoplasty with bilateral inferior turbinate  reductions.   SURGEON:  Kristine Garbe. Ezzard Standing, MD   ANESTHESIA:  General endotracheal.   COMPLICATIONS:  None.   BRIEF CLINICAL NOTE:  [name] is a 70 year old female who has had  chronic nasal obstruction is worse at night when she lies down.  It  alternates from side-to-side little worse on the left side.  On  examination, she has a septal deviation to the left with large  turbinates.  There are no polyps, no obstructing lesions otherwise in  the nose.  She was taken to the operating room at this time for  septoplasty and bilateral inferior turbinate reductions.   DESCRIPTION OF PROCEDURE:  After adequate endotracheal anesthesia, the  patient received 1 g Ancef IV preoperatively.  Nose was prepped with  Betadine solution.  Nose was then further prepped with cotton pledgets  soaked in Afrin and the septum was injected with Xylocaine with  epinephrine.  A hemitransfixion incision was made along the caudal edge  of the septum on the right side.  Mucoperichondrial and mucoperiosteal  flaps were elevated posteriorly.  The patient had the vomer bony portion  of the septum protruding to the right side.  Mucoperiosteal flaps were  elevated on either side of this and the deviated bony portion was  removed.  Along the floor of the nose, the cartilaginous septum bowed  more into the left airway.  A 2-mm  strip of cartilaginous septum was  removed along the maxillary crest.  This allowed the septum to return  more toward midline.  The hemitransfixion incision was closed with  interrupted 4-0 chromic sutures and the septum was basted with a 4-0  chromic suture.  The inferior turbinate reductions were performed by  elevating the mucosal membrane off the turbinate bone medially and then  the turbinate bone and some lateral turbinate mucosa was amputated.  Suction cautery was used for hemostasis and the remaining posterior  turbinate was outfractured.  Splints were secured on either side of the  septum with a 3-0 nylon suture.  Nose was then packed with Telfa soaked  in bacitracin ointment bilaterally.  This completed the procedure.  [name] was awoke from anesthesia and transferred to the recovery room  postop doing well.   DISPOSITION:  [name] was discharged home later this morning on Keflex 500  mg b.i.d. for 1 week, Tylenol and Vicodin p.r.n. pain.  She will remove  the packs at home in the morning and will follow up in my office in 1  week  for recheck.           ______________________________  Kristine Garbe. Ezzard Standing, M.D.     CEN/MEDQ  D:  08/10/2008  T:  08/10/2008  Job:  [id number]

## 2011-01-13 NOTE — Procedures (Signed)
CAROTID DUPLEX EXAM   INDICATION:  Carotid artery disease.   HISTORY:  Diabetes:  No.  Cardiac:  CABG.  Hypertension:  Yes.  Smoking:  Previous.  Previous Surgery:  Right CEA with DPA in 1992.  Repair of right carotid  pseudoaneurysm with VPA on 02/13/2005 by Dr. Arbie Cookey.  CV History:  Asymptomatic.  Amaurosis Fugax Yes No, Paresthesias Yes No, Hemiparesis Yes No                                       RIGHT             LEFT  Brachial systolic pressure:         160               162  Brachial Doppler waveforms:         Within normal limits                Within normal limits  Vertebral direction of flow:        Antegrade         Antegrade  DUPLEX VELOCITIES (cm/sec)  CCA peak systolic                   73                86  ECA peak systolic                   54-proximal occlusion               124  ICA peak systolic                   91                341  ICA end diastolic                   23                45  PLAQUE MORPHOLOGY:                  Homogeneous       Heterogeneous  PLAQUE AMOUNT:                      Moderate          Moderate  PLAQUE LOCATION:                    CCA, bifurcation  ICA   IMPRESSION:  1. Right internal carotid artery shows evidence of 1%-39% stenosis.  2. Left internal carotid artery shows evidence of 40%-59% stenosis.  3. Unable to rule out multiple channels of left internal carotid      artery flow.   ___________________________________________  Larina Earthly, M.D.   EM/MEDQ  D:  06/03/2010  T:  06/03/2010  Job:  [id number]

## 2011-01-13 NOTE — Assessment & Plan Note (Signed)
OFFICE VISIT   Catherine Green, Catherine Green  DOB:  [birth year]                                       06/03/2010  ZOXWR#:[id number]   Patient presents today for follow-up of her extracranial cerebrovascular  occlusive disease.  She is a very pleasant 70 year old white female well  known to me from prior carotid surgery.  She had initially undergone a  right carotid endarterectomy with Dr. Orland Mustard in 1992.  In 2006 I  saw her with a pseudoaneurysm of her carotid, and she underwent redo  carotid endarterectomy and vein patch angioplasty of her endarterectomy  site.  She has had no symptoms of carotid disease, particularly no  amaurosis fugax, change in ischemic attack or stroke.  We have followed  her with serial ultrasounds to rule out any change in her extracranial  cerebrovascular occlusive disease.   Her past medical history has remained quite stable.  She has no new  medical difficulties.  She does have a history of coronary artery bypass  grafting.  She smoked in the past but does not smoke currently.  She  does not have diabetes.  She is hypertensive.   PHYSICAL EXAMINATION:  A well-developed and well-nourished white  appearing stated age of 42 in no acute distress.  Blood pressure 170/69,  pulse 52, respirations 18.  HEENT is normal.  She does have soft carotid  bruits bilaterally.  Her right carotid incision is well-healed.  Musculoskeletal shows no major deformity or cyanosis.  Neurologic:  No  focal weakness, paresthesias.  Skin without ulcers or rashes.   She underwent a carotid duplex today, which I have ordered and  independently reviewed.  This reveals no evidence of recurrence in her  right carotid system.  She does have a 40% to 60% narrowing in the left  carotid system, which is stable.   I again discussed symptoms of carotid disease with patient.  She will  notify us immediately should this occur, otherwise we will see her again  in 1 year with  a repeat carotid duplex.     Catherine Green, M.D.  Electronically Signed   TFE/MEDQ  D:  06/03/2010  T:  06/04/2010  Job:  [id number]   cc:   Dr. Loma Sender, [facility]

## 2011-01-13 NOTE — Procedures (Signed)
CAROTID DUPLEX EXAM   INDICATION:  Follow-up evaluation of known carotid artery disease.   HISTORY:  Diabetes:  No.  Cardiac:  Coronary artery bypass graft in 2004.  Hypertension:  Yes.  Smoking:  Quit over 15 years.  Previous Surgery:  Right carotid endarterectomy with Dacron patch  angioplasty in 1992.  Repair of right carotid artery pseudoaneurysm with  vein patch angioplasty on 02/13/2005.  CV History:  No.  Amaurosis Fugax No, Paresthesias No, Hemiparesis No                                       RIGHT             LEFT  Brachial systolic pressure:         168               170  Brachial Doppler waveforms:         WNL               WNL  Vertebral direction of flow:        Antegrade         Antegrade  DUPLEX VELOCITIES (cm/sec)  CCA peak systolic                   113               125  ECA peak systolic                   Occluded          147  ICA peak systolic                   70                253  ICA end diastolic                   23                65  PLAQUE MORPHOLOGY:                  Heterogeneous     Calcified  PLAQUE AMOUNT:                      Mild              Moderate  PLAQUE LOCATION:                    BIF/ICA           BIF/ICA   IMPRESSION:  1. A 20% to 39% right internal carotid artery restenosis.  2. A 60% to 79% left internal carotid artery stenosis.   ___________________________________________  Larina Earthly, M.D.   AC/MEDQ  D:  05/03/2009  T:  05/03/2009  Job:  [id number]

## 2011-01-16 ENCOUNTER — Other Ambulatory Visit: Payer: Self-pay

## 2011-01-16 MED ORDER — ROSUVASTATIN CALCIUM 20 MG PO TABS: 20.0000 mg | ORAL_TABLET | Freq: Every day | ORAL | Status: DC

## 2011-01-16 NOTE — Telephone Encounter (Signed)
Catherine Green stopped taking the simvastatin 3 months prior to cholesterol check.  She will start on the crestor 20 mg one tablet everyday at bedtime.  She could not tolerate the crestor 40 mg dose.

## 2011-01-16 NOTE — Op Note (Signed)
NAME:  Catherine Green, Catherine Green NO.:  [id number]   MEDICAL RECORD NO.:  [id number]                   PATIENT TYPE:  INP   LOCATION:  2314                                 FACILITY:  [facility]   PHYSICIAN:  Kerin Perna III, M.D.           DATE OF BIRTH:  1941-08-10   DATE OF PROCEDURE:  06/14/2003  DATE OF DISCHARGE:                                 OPERATIVE REPORT   PREOPERATIVE DIAGNOSIS:  Class III progressive angina with left main  stenosis and two vessel coronary disease.   POSTOPERATIVE DIAGNOSIS:  Class III progressive angina with left main  stenosis and two vessel coronary disease.   OPERATION PERFORMED:  Coronary artery bypass grafting x 3 (left internal  mammary artery to left anterior descending, saphenous vein graft to  diagonal, saphenous vein graft to circumflex marginal).   SURGEON:  Mikey Bussing, M.D.   ASSISTANTS:  1. Salvatore Decent Dorris Fetch, M.D.  2. Rowe Clack, P.A.-C.   ANESTHESIA:  General by Dr. Laverle Hobby.   INDICATIONS:  The patient is a 70 year old female with a history of smoking,  hyperlipidemia and a strong family history of coronary disease who presented  for evaluation of exertional angina.  A cardiac catheterization performed by  Dr. Lenise Herald demonstrated significant 80% left main stenosis.  She is  referred for surgical coronary revascularization.  Prior to the surgery, I  examined the patient in the office and reviewed the results of the cardiac  catheterization with the patient and husband.  I discussed the indications  and expected benefits of coronary bypass surgery for treatment of her  coronary artery disease.  I reviewed the alternatives to surgical therapy  for treatment of her heart disease as well.  I discussed with the patient  and husband the major aspects of the proposed operation including the choice  of conduit for grafting, the use of general anesthesia and cardiopulmonary  bypass and the  expected postoperative hospital recovery.  I reviewed with  the patient the risks to her of coronary bypass surgery including risks of  MI, CVA, bleeding, blood transfusion requirement, infection and death.  She  understood these implications for the surgery and agreed to proceed with the  operation as planned under what I felt was an informed consent.   OPERATIVE FINDINGS:  The saphenous vein was harvested from the right upper  leg using the open technique.  The mammary artery was a good conduit with  excellent flow.  The coronaries were small but adequate target.  The  anterior myocardium was without evidence of scarring or fibrosis.  A unit of  blood was given during the period on bypass due to a hemoglobin of 7 grams.   DESCRIPTION OF PROCEDURE:  The patient was brought to the operating room and  placed supine on the operating table where general anesthesia was induced  under invasive hemodynamic monitoring.  The  chest, abdomen, and legs were  prepped with Betadine and draped as a sterile field.  A sternal incision was  made as the saphenous vein was harvested from the right thigh.  The left  internal mammary artery was harvested as a pedicle graft from its origin in  the subclavian vessels.  Heparin was administered, and the ACT was  documented as being therapeutic.  The sternal retractor was placed.  The  pursestrings were placed in the ascending aorta and right atrium.  The  patient was cannulated and placed on bypass, cooled to 32 degrees.  The  coronaries were identified for grafting and the mammary artery and vein  grafts  were prepared for the distal anastomoses.  Cardioplegic catheters  were placed for both antegrade aortic and retrograde coronary sinus  cardioplegia.  The aortic cross-clamp was applied and a total of 800 cc of  cold blood cardioplegia was delivered between the antegrade aortic and  retrograde coronary sinus catheters.  There was a good cardioplegic arrest   and septal temperature dropped to less than 12 degrees.  Topical iced saline  was used to augment myocardial preservation and a pericardial insulator pad  was used to protect the left phrenic nerve.   The distal coronary anastomoses were then performed.  The first distal  anastomosis was to the diagonal.  This was a 1.5 mm vessel with proximal 70%  stenosis.  A reverse saphenous vein was sewn end-to-side with a running 7-0  Prolene.  There was good flow through the graft.  The second distal  anastomosis was the circumflex marginal..  This is a 1.7 mm vessel with  proximal 80-90% stenosis.  A reverse saphenous vein was sewn end-to-side  with a running 7-0 Prolene with good flow through the graft.  Cardioplegia  was redosed.  The third distal anastomosis was to the mid LAD.  This is a  1.5 mm vessel with proximal 80-90% left main stenosis. . The left internal  mammary artery pedicle was brought through an opening created in the left  lateral pericardium.  It was brought down onto the LAD and sewn end-to-side  with a running 8-0 Prolene.  There was excellent flow through the  anastomosis with immediate rise in septal temperature after release of the  pedicle clamp on the mammary artery.  The mammary pedicle was secured to the  epicardium.  The aortic cross-clamp was removed.   The heart was cardioverted back to a regular rhythm.  A partial occlusion  clamp was placed on the ascending aorta and two proximal vein anastomosis  were performed using a 4.0 mm punch and a running 6-0 Prolene.  Partial  clamp was removed.  The vein grafts were perfused.  All three grafts were  examined and found to be hemostatic with a good flow.  Hemostasis was  generally adequate.  When the patient received 37 degrees and after  temporary pacing wires were applied she was weaned from bypass after the  ventilator was resumed.  The patient remained hemodynamically stable and Protamine was administered without  adverse reaction.  The catheters for  cannulation were removed and the pursestrings tied.  The patient remained  hemodynamically stable.  The leg incisions were irrigated and closed in a  standard fashion.  The pericardial space was irrigated with warm antibiotic  irrigation.  The superior pericardial fat was closed over the aorta and vein  grafts.  Two mediastinal chest tubes were placed and brought out through  separate incisions, and  a left pleural chest tube was placed as well.  The  sternum as closed with interrupted steel wire.  The pectoralis fascia was  closed with a running #1 Vicryl.  The subcutaneous and skin were closed with  a running Vicryl and sterile dressings were applied.  Total bypass time was  100 minutes with aortic cross clamp time of 35 minutes.                                               Mikey Bussing, M.D.    PV/MEDQ  D:  06/14/2003  T:  06/15/2003  Job:  [id number]   cc:   Darlin Priestly, M.D.  [phone] [address]  [place]  [place] [address]  Fax: [phone]   [facility] office

## 2011-01-16 NOTE — Op Note (Signed)
Catherine Green, DOANE NO.:  [id number]   MEDICAL RECORD NO.:  [id number]          PATIENT TYPE:  INP   LOCATION:  3315                         FACILITY:  [facility]   PHYSICIAN:  Larina Earthly, M.D.    DATE OF BIRTH:  10-07-1940   DATE OF PROCEDURE:  02/13/2005  DATE OF DISCHARGE:                                 OPERATIVE REPORT   PREOPERATIVE DIAGNOSIS:  Right carotid false aneurysm.   POSTOPERATIVE DIAGNOSIS:  Right carotid false aneurysm.   PROCEDURE:  Resection of right carotid false aneurysm, redo endarterectomy  and vein patch angioplasty with vein from the left thigh.   SURGEON:  Larina Earthly, M.D.   ASSISTANT:  Pecola Leisure, P.A.-C.   ANESTHESIA:  General endotracheal anesthesia.   COMPLICATIONS:  None.   DISPOSITION:  To the recovery room stable.   DRAINS:  10 flat Jackson-Pratt drain.   INDICATIONS FOR PROCEDURE:  The patient is a 70 year old white female who is  14 years status post right carotid endarterectomy by Dr. Edilia Bo.  The  patient had had progressive enlargement in the false aneurysm in the right  neck and this has now become more symptomatic and has caused difficulty with  swallowing and has become tender.  It is recommended that the patient  undergo elective repair.   PROCEDURE IN DETAIL:  The patient was taken to the operating room and placed  in the supine position where the area of the right neck was prepped and  draped in the usual sterile fashion.  An incision was made through the prior  incision and carried down to isolate the sternocleidomastoid muscle  reflected posteriorly.  The patient had a large false aneurysm in the right  neck.  The common carotid artery was encircled at the base of the neck below  the false aneurysm.  Dissection was carried onto the false aneurysm and up  onto the carotid bifurcation.  There was a dense adherence of scar tissue  around the prior placed patch and false aneurysm diffusely.  The  external  carotid was occluded chronically.  This was encircled with a vessel loop.  The internal carotid was exposed further proximally and the hypoglossal and  vagus nerves were identified and preserved.  The patient then had a separate  incision made in the left groin and the saphenous vein was harvested from  the saphenofemoral junction down to the proximal mid thigh.  The vein was  ligated proximally and distally and divided.  The vein was of excellent  caliber.  The patient was given 7000 units of intravenous heparin.  After  adequate circulation time, the common and internal carotid arteries were  occluded.  The false aneurysm was opened and a large amount of mural  thrombus was removed.  The patch was completely disrupted from the anterior  surface.  A long 10 shunt was passed up the internal carotid, allowed to  back bleed, and down the common carotid where it was secured with Rummel  tourniquets.  The old patch was removed in its entirety and there was plaque  in the  carotid artery and this was extended down proximal to the prior  endarterectomy patch and this was extended also up onto the internal  carotid.  The external carotid was endarterectomized with eversion technique  and this restored to the chronically occluded external carotid artery.  This  was occluded with a clamp. The endarterectomy plane remaining atheromatous  debris was removed.  A decision was made to use the vein as a patch  angioplasty rather than replacement.  The vein was opened longitudinally and  was sewn as a patch angioplasty with a running 6-0 Prolene suture.  Prior to  completion of the closure, the shunt was removed and the usual flush  maneuvers were undertaken.  The anastomosis was then closed.  The external  followed by the common and finally the internal carotid occlusion clamps  were removed.  Excellent flow characteristics were noted with the handheld  Doppler in the internal and external  carotid arteries.  The patient was  given 50 mg of protamine to reverse the heparin.  The wound was irrigated  with saline, hemostasis was obtained with electrocautery.  A 10 flat Al Pimple drain was placed through a separate stab incision into the base of the  neck and was placed in the depths of the wound.  The sternocleidomastoid  muscle was closed with several interrupted 3-0 Vicryl sutures  reapproximating the sternocleidomastoid over the carotid sheath, next the  platysma was closed with running 3-0 Vicryl suture, and finally the skin was  closed with 4-0 subcuticular Vicryl stitch.  The groin was closed with 2-0  Vicryl in the subcutaneous tissue and the skin was closed with 3-0  subcuticular Vicryl stitch.  The patient was awakened in the operating room  neurologically intact and was transferred to the recovery room in stable  condition.       TFE/MEDQ  D:  02/13/2005  T:  02/13/2005  Job:  [id number]   cc:   Loma Sender  [address]  [place]  [place] [address]  Fax: [phone]   Darlin Priestly, MD  [phone] [address]  [place]  [place] [address]  Fax: [phone]

## 2011-01-16 NOTE — H&P (Signed)
[name], Green NO.:  [id number]   MEDICAL RECORD NO.:  [id number]          PATIENT TYPE:  INP   LOCATION:  NA                           FACILITY:  [facility]   PHYSICIAN:  Larina Earthly, M.D.    DATE OF BIRTH:  November 15, 1940   DATE OF ADMISSION:  02/06/2005  DATE OF DISCHARGE:                                HISTORY & PHYSICAL   CHIEF COMPLAINT:  Large lump on neck.   HISTORY OF PRESENT ILLNESS:  The patient is a 70 year old white female with  a history of a previous right carotid endarterectomy done in 1992 by Hendricks Milo, MD.  She has been followed for some time by Dr. Tawanna Cooler Early due to a  pseudoaneurysm of the right carotid repair.  Recently, it has increased in  size and she has developed symptoms of tenderness as well as some hoarseness  and increasing discomfort.  A recent CT angiogram confirmed the finding of  the pseudoaneurysm at greater than 3 cm in diameter.  There was also  evidence of some mural thrombus.  The patient has occasional mild headaches,  occasional nausea, occasional vertigo and dizziness.  No history of fall or  seizures.  He describes muscle weakness in her calves, especially when going  up steps.  She has some dysphagia and occasional visual disturbances to  include a spots and wavy lines type sensation.  No frank syncope but does  have some presyncopal symptoms.  She also states she is having some  difficulty with memory loss but no actual confusion.  Her only focal  neurological signs are occasional episodes of a left arm heaviness type  sensation.  Due to the findings on CT angiogram and ultrasound, it is Dr.  Bosie Helper opinion that she would best be served with repair of this  pseudoaneurysm and she will be admitted this hospitalization for the  procedure.   PAST MEDICAL HISTORY:  1.  Coronary artery disease.  2.  Extracranial cerebrovascular occlusive disease.  3.  History of left lower extremity phlebitis.  4.  History of  hypertension.  5.  History of hyperlipidemia.   PAST SURGICAL HISTORY:  1.  Coronary artery bypass graft x 3 in October of 2004 by Dr. Kerin Perna, M.D.  2.  Previous right carotid endarterectomy.  3.  Total abdominal hysterectomy.  4.  Appendectomy.   ALLERGIES:  She has no frank allergies.  However, she does have  gastrointestinal intolerance to DEMEROL.   CURRENT MEDICATIONS:  1.  Toprol XL 50 mg daily.  2.  Hydrochlorothiazide 50 mg daily.  3.  Aspirin 325 mg daily.  4.  Xanax 0.25 mg daily.  5.  Vytorin 10/20 q.d.  6.  Zantac over-the-counter dose on a p.r.n. basis.   REVIEW OF SYSTEMS:  As per the history of present illness for pertinent  positives and negatives.  Otherwise, she denies contributory full system  review.   SOCIAL HISTORY:  She is married.  She is a former smoker.  Quit  approximately 15 years ago.  Smoked for  at least 30 years but she describe  use as approximately one pack per week.  Alcohol use none.  Occupation:  She  works as a Producer, television/film/video.   FAMILY HISTORY:  Multiple family members with history of coronary disease.   PHYSICAL EXAMINATION:  VITAL SIGNS:  Blood pressure 150/70, heart rate 60,  respirations 12.  GENERAL:  This is a 69 year old white female in no acute distress.  HEENT:  Normocephalic and atraumatic.  Pupils are equal, round and reactive  to light.  Extraocular movements intact.  Pharynx is pink and moist.  No  obvious exudates or lesions.  Sclerae is anicteric.  NECK:  Supple.  No jugular venous distension.  Carotid arteries are  palpable.  The right side shows significant enlargement with a pulsatile  enlarged mass with a loud bruit.  On the left side, there is no bruit  auscultated.  There is no lymphadenopathy.  PULMONARY:  Symmetrical on inspiration.  Unlabored and clear breath sounds.  CARDIAC:  Regular rate and rhythm.  Normal S1, S2, 2/6 systolic murmur best  heard in the aortic region.  ABDOMEN:  Soft, nontender,  nondistended.  Normal active bowel sounds.  GENITOURINARY/RECTAL:  Deferred.  EXTREMITIES:  No clubbing, cyanosis, or edema.  There is well-healed  venectomy site on the right extremity from the knee to thigh.  Temperature  of the skin is warm.  Peripheral pulses are equal and intact bilaterally.  NEUROLOGIC:  Nonfocal.  She is alert and oriented x 4.  Gait is steady.  Muscle strength is 5+ and equal throughout.  There are no asymmetrical  findings.  Her deep tendon reflexes are 2+ and equal bilaterally.   ASSESSMENT:  Symptomatic right carotid pseudoaneurysm for scheduled repair  February 06, 2005 per Larina Earthly, M.D.       Catherine Green  D:  02/04/2005  T:  02/04/2005  Job:  [id number]   cc:   Loma Sender  [address]  [place]  [place] [address]  Fax: [phone]   Darlin Priestly, MD  [phone] [address], [address]  [place]  [place] [address]  Fax: [phone]

## 2011-01-16 NOTE — Discharge Summary (Signed)
NAME:  Catherine Green, ALARID NO.:  [id number]   MEDICAL RECORD NO.:  [id number]                   PATIENT TYPE:  INP   LOCATION:  2003                                 FACILITY:  [facility]   PHYSICIAN:  Kerin Perna, M.D.               DATE OF BIRTH:   DATE OF ADMISSION:  06/14/2003  DATE OF DISCHARGE:  06/20/2003                                 DISCHARGE SUMMARY   HISTORY OF PRESENT ILLNESS:  The patient is a 70 year old female with a  known history of vascular disease. She underwent previous carotid  endarterectomy in 1992 by Dr. Hendricks Milo.  Additionally, she has known  hypertension, hyperlipidemia and a strong positive family history of  coronary artery disease having had two siblings who had bypass surgery.  She  has noted over the past several months while climbing stairs that she would  develop increasing shortness of breath and jaw pain.  There is no history of  diaphoresis and with rest the episodes resolve.  She also has a history of  several near syncope episodes in April 2003.  A Cardiolite stress test done  on April 09, 2003 was a positive EKG portion with no evidence of ischemia on  the perfusion images.  She had a normal ejection fraction.  She has a known  aneurysmal dilatation of the right carotid endarterectomy site.  She has no  previous history of myocardial infarction.  She does have a previous history  of smoking, but quit approximately one year ago.  She underwent a  cardiac  catheterization by Dr. Jenne Campus and this revealed a 30% proximal right  coronary artery narrowing, but otherwise clear vessels on the right side.  There was an ostial left main and described at the time of catheterization  dampening with engagement of the catheter.  Overall, it appeared to be at  least 80% stenosis.  The patient also had a repeat Carotid Doppler which was  reviewed by Dr. Arbie Cookey and she is not felt to require re-do carotid  endarterectomy at this  time, but it will be followed very closely.  Dr.  Tyrone Sage recommended surgical revascularization due to the coronary anatomy  and increased symptoms, but as he was not available to do the surgery the  patient was seen also in consultation by Kathlee Nations Trigt who recommended the  same and she was admitted this hospitalization for the procedure.   PROCEDURE:  On June 14, 2003, the patient was taken to the operating room  where she underwent the following procedure:  Coronary artery bypass  grafting x3.  The following grafts were placed:  1.  Left internal mammary  artery to the LAD.  2.  Saphenous vein graft to the circumflex.  3.  A  saphenous vein graft to the diagonal coronary artery.  The cross clamp time  was 39 minutes.  The pump time was 100 minutes.  The patient tolerated the  procedure well and was taken to the surgical intensive care unit in stable  condition.   POSTOPERATIVE HOSPITAL COURSE:  The patient has done well.  She maintains  stable hemodynamics in the intensive care unit.  Her stay there was  unremarkable.  She is neurologically intact.  All routine lines, monitors  and drainage devices were discontinued in a standard fashion although the  pleural tube did remain a little longer as initially had an air leak that  did resolve.  She does have a mild postoperative anemia, but laboratory  values are felt to be stable.  Most recent hemoglobin and hematocrit dated  June 19, 2003 are 10.4, 30.5 respectively.  The patient's other  laboratory values are stable.  The patient did have an elevated temperature  and urine culture has been obtained, but value is currently pending.  The  patient has been started on a course of oral Ceftin 250 mg b.i.d.  The  patient has been seen by smoking cessation counselor and recommendations  have been made.   Currently, the patient is felt to be quite stable.  Incisions are healing  well without signs of infection.  There is some moderate  drainage from the  lower leg incision, but no erythema or other signs of cellulitis.  White  blood cell count is 7.0 on June 19, 2003.  The patient is afebrile.  She  has tolerated routine advancement in activities commensurate to level of  postoperative convalescence.  She has undergone a general diuresis.  She is  maintaining normal sinus rhythm without significant cardiac dysrhythmias or  ectopy.  The patient is tolerating diet although did have some intermittent  nausea.  This has improved.  Overall, the patient is felt to be quite stable  for tentative discharge on the morning of June 20, 2003 pending morning  round reevaluation.   MEDICATIONS ON DISCHARGE:  1. Crestor 10 mg q.h.s.  2. Ecotrin 325 mg daily.  3. Avalide 150/12.5 mg daily.  4. Toprol XL 15 mg daily.  5. Ceftin 250 mg twice daily for an additional five days.  6. For pain, Ultram 50 mg one or two every six hours as needed.   INSTRUCTIONS:  The patient will receive written instructions regarding  medications, activity, diet, wound care and followup.   FOLLOW UP:  Include Dr. Jenne Campus in two weeks.  Dr. Donata Clay Friday,  July 06, 2003 at 12:45.   FINAL DIAGNOSES:  1. Severe left main coronary artery disease.  2. Mild postoperative anemia.  3. History of previous right carotid endarterectomy with vein patch     angioplasty and a current aneurysmal dilatation being followed by Gretta Began, M.D.  4. History of thrombophlebitis of the left leg following childbirth.  5. History of presyncope.  6. History of hypertension.   ALLERGIES:  DEMEROL.       Rowe Clack, P.A.-C.                    Kerin Perna, M.D.    Sherryll Burger  D:  06/19/2003  T:  06/19/2003  Job:  [id number]   cc:   Darlin Priestly, M.D.  [phone] [address], [address]  [place]  [place] [address]  Fax: [phone]   Loma Sender  [address]  [place]  [place] [address]  Fax: [phone]

## 2011-03-13 ENCOUNTER — Encounter: Payer: Self-pay | Admitting: Cardiovascular Disease

## 2011-03-16 ENCOUNTER — Ambulatory Visit: Payer: PRIVATE HEALTH INSURANCE | Admitting: Cardiovascular Disease

## 2011-03-20 ENCOUNTER — Ambulatory Visit (INDEPENDENT_AMBULATORY_CARE_PROVIDER_SITE_OTHER): Payer: PRIVATE HEALTH INSURANCE | Admitting: Cardiovascular Disease

## 2011-03-20 ENCOUNTER — Encounter: Payer: Self-pay | Admitting: Cardiovascular Disease

## 2011-03-20 DIAGNOSIS — I10 Essential (primary) hypertension: Secondary | ICD-10-CM

## 2011-03-20 DIAGNOSIS — M79602 Pain in left arm: Secondary | ICD-10-CM | POA: Insufficient documentation

## 2011-03-20 DIAGNOSIS — I25810 Atherosclerosis of coronary artery bypass graft(s) without angina pectoris: Secondary | ICD-10-CM

## 2011-03-20 DIAGNOSIS — E785 Hyperlipidemia, unspecified: Secondary | ICD-10-CM

## 2011-03-20 DIAGNOSIS — I739 Peripheral vascular disease, unspecified: Secondary | ICD-10-CM

## 2011-03-20 DIAGNOSIS — I2510 Atherosclerotic heart disease of native coronary artery without angina pectoris: Secondary | ICD-10-CM

## 2011-03-20 DIAGNOSIS — R609 Edema, unspecified: Secondary | ICD-10-CM

## 2011-03-20 DIAGNOSIS — M79609 Pain in unspecified limb: Secondary | ICD-10-CM

## 2011-03-20 MED ORDER — FUROSEMIDE 20 MG PO TABS: 20.0000 mg | ORAL_TABLET | ORAL | Status: DC | PRN

## 2011-03-20 MED ORDER — SIMVASTATIN 40 MG PO TABS: 40.0000 mg | ORAL_TABLET | Freq: Every evening | ORAL | Status: DC

## 2011-03-20 NOTE — Patient Instructions (Signed)
You are doing well. Please restart simvastatin daily Please call us if you have new issues that need to be addressed before your next appt.  We will call you for a follow up Appt. In 6 months

## 2011-03-20 NOTE — Assessment & Plan Note (Signed)
Currently with no symptoms of angina. No further workup at this time. Continue current medication regimen. 

## 2011-03-20 NOTE — Progress Notes (Signed)
Patient ID: Catherine Green, female    DOB: 07/04/1941, 70 y.o.   MRN: [id number]  HPI Comments: Catherine Green is a very pleasant 70 year old woman with history coronary artery disease, bypass surgery, peripheral vascular disease with history of right carotid endarterectomy followed by Dr. Arbie Cookey, who presents for routine followup. Last catheterization in January 2009 showing patent vein grafts, atretic LIMA to the LAD.     Catherine Green reports that she has been doing well. She is no longer taking crestor as this upset her stomach. She also reports that venlafaxine, started by Dr. Hetty Ely, does not agree with her and causes nausea. She did well previously on simvastatin and would like to restart this.  She has had three weeks of left arm, biceps and neck discomfort. She denies nay trauma or heavy lifting. "Maybe it was from doing peoples hair?" She has significant pain diffusely with lifting her arm upwards, and "holding a tea pot".    Old EKG shows normal sinus rhythm with rate of 54 beats per minute, no significant ST or T wave changes.      Outpatient Encounter Prescriptions as of 03/20/2011  Medication Sig Dispense Refill  . aspirin 81 MG tablet Take 81 mg by mouth daily.        . carvedilol (COREG) 6.25 MG tablet Take 6.25 mg by mouth 2 (two) times daily with a meal.        . furosemide (LASIX) 20 MG tablet Take 1 tablet (20 mg total) by mouth as needed.  30 tablet  4  . hydrochlorothiazide 50 MG tablet Take 50 mg by mouth daily.        Marland Kitchen venlafaxine (EFFEXOR) 37.5 MG tablet Take 1 tablet by mouth Daily. NOT TAKING          Review of Systems  Constitutional: Negative.   HENT: Negative.   Eyes: Negative.   Respiratory: Negative.   Cardiovascular: Negative.   Gastrointestinal: Positive for nausea.  Musculoskeletal: Negative.        Left Arm pain radiating to the neck  Skin: Negative.   Neurological: Negative.   Hematological: Negative.   Psychiatric/Behavioral: Negative.     All other systems reviewed and are negative.    BP 155/66  Pulse 50  Ht 5\' 2"  (1.575 m)  Wt 154 lb (69.854 kg)  BMI 28.17 kg/m2  SpO2 98%  Physical Exam  Nursing note and vitals reviewed. Constitutional: She is oriented to person, place, and time. She appears well-developed and well-nourished.  HENT:  Head: Normocephalic.  Nose: Nose normal.  Mouth/Throat: Oropharynx is clear and moist.  Eyes: Conjunctivae are normal. Pupils are equal, round, and reactive to light.  Neck: Normal range of motion. Neck supple. No JVD present.  Cardiovascular: Normal rate, regular rhythm, S1 normal, S2 normal, normal heart sounds and intact distal pulses.  Exam reveals no gallop and no friction rub.   No murmur heard. Pulmonary/Chest: Effort normal and breath sounds normal. No respiratory distress. She has no wheezes. She has no rales. She exhibits no tenderness.  Abdominal: Soft. Bowel sounds are normal. She exhibits no distension. There is no tenderness.  Musculoskeletal: Normal range of motion. She exhibits no edema and no tenderness.  Lymphadenopathy:    She has no cervical adenopathy.  Neurological: She is alert and oriented to person, place, and time. Coordination normal.  Skin: Skin is warm and dry. No rash noted. No erythema.  Psychiatric: She has a normal mood and affect. Her behavior is  normal. Judgment and thought content normal.         Assessment and Plan

## 2011-03-20 NOTE — Assessment & Plan Note (Signed)
We have strongly Encouraged her to restart a statin. She does not want to take Crestor. Will restart simvastatin 40 mg daily

## 2011-03-20 NOTE — Assessment & Plan Note (Signed)
Carotid disease followed by Dr. Arbie Cookey. We will try to obtain her most recent ultrasound for our records.

## 2011-03-20 NOTE — Assessment & Plan Note (Signed)
Left arm pain is clearly musculoskeletal. We have suggested she iced it, try NSAIDs. If no relief in several weeks, she could discuss this with Dr. Vear Clock.

## 2011-03-20 NOTE — Assessment & Plan Note (Signed)
We have asked her to monitor her blood pressure closely and contact our office if her systolic pressure continues to be greater than 140.

## 2011-04-08 ENCOUNTER — Telehealth: Payer: Self-pay

## 2011-04-08 MED ORDER — HYDROCHLOROTHIAZIDE 50 MG PO TABS: 50.0000 mg | ORAL_TABLET | Freq: Every day | ORAL | Status: DC

## 2011-04-08 MED ORDER — ROSUVASTATIN CALCIUM 20 MG PO TABS: 20.0000 mg | ORAL_TABLET | Freq: Every day | ORAL | Status: DC

## 2011-04-08 NOTE — Telephone Encounter (Signed)
Needs a refill on HCTZ.  She needs to go back on the crestor, the simvastatin is causing itching and breaking out with knots. Rx sent into [facility] for crestor 20 mg take one tablet daily.

## 2011-04-09 NOTE — Telephone Encounter (Signed)
Ok to refill both hctz and crestor

## 2011-05-08 ENCOUNTER — Encounter: Payer: Self-pay | Admitting: Vascular Surgery

## 2011-06-05 LAB — BASIC METABOLIC PANEL
BUN: 14 mg/dL (ref 6–23)
CO2: 34 mEq/L — ABNORMAL HIGH (ref 19–32)
Calcium: 9.7 mg/dL (ref 8.4–10.5)
Chloride: 97 mEq/L (ref 96–112)
Creatinine, Ser: 0.79 mg/dL (ref 0.4–1.2)
GFR calc Af Amer: >60 mL/min (ref >60)
GFR calc non Af Amer: >60 mL/min (ref >60)
Glucose, Bld: 122 mg/dL — ABNORMAL HIGH (ref 70–99)
Potassium: 3.7 mEq/L (ref 3.5–5.1)
Sodium: 140 mEq/L (ref 135–145)

## 2011-06-05 LAB — POCT HEMOGLOBIN-HEMACUE: Hemoglobin: 12 g/dL (ref 12.0–15.0)

## 2011-06-09 ENCOUNTER — Other Ambulatory Visit: Payer: PRIVATE HEALTH INSURANCE

## 2011-06-09 ENCOUNTER — Ambulatory Visit: Payer: PRIVATE HEALTH INSURANCE | Admitting: Vascular Surgery

## 2011-06-17 ENCOUNTER — Ambulatory Visit: Payer: PRIVATE HEALTH INSURANCE | Admitting: Vascular Surgery

## 2011-06-17 ENCOUNTER — Other Ambulatory Visit: Payer: PRIVATE HEALTH INSURANCE

## 2011-06-22 ENCOUNTER — Encounter: Payer: Self-pay | Admitting: Vascular Surgery

## 2011-06-23 ENCOUNTER — Ambulatory Visit (INDEPENDENT_AMBULATORY_CARE_PROVIDER_SITE_OTHER): Payer: PRIVATE HEALTH INSURANCE | Admitting: *Deleted

## 2011-06-23 ENCOUNTER — Encounter: Payer: Self-pay | Admitting: Vascular Surgery

## 2011-06-23 ENCOUNTER — Ambulatory Visit (INDEPENDENT_AMBULATORY_CARE_PROVIDER_SITE_OTHER): Payer: PRIVATE HEALTH INSURANCE | Admitting: Vascular Surgery

## 2011-06-23 VITALS — BP 165/74 | HR 57 | Resp 16 | Ht 63.0 in | Wt 156.8 lb

## 2011-06-23 DIAGNOSIS — I6529 Occlusion and stenosis of unspecified carotid artery: Secondary | ICD-10-CM

## 2011-06-23 DIAGNOSIS — Z48812 Encounter for surgical aftercare following surgery on the circulatory system: Secondary | ICD-10-CM

## 2011-06-23 NOTE — Progress Notes (Signed)
The patient presents today for followup of her extracranial cerebrovascular occlusive disease. She is status post right carotid endarterectomy in 1992 and a revision in 2006 due to a pseudoaneurysm in her patch. She does have known moderate left internal carotid artery stenosis. She remains asymptomatic. She's had no new cardiac difficulties. She is status post coronary artery bypass grafting in 2004.  Past Medical History  Diagnosis Date  . Hyperlipidemia   . Coronary artery disease   . Hypertension   . PVD (peripheral vascular disease)     endarterectomy by Dr. Arbie Cookey  . Pre-syncope   . Thrombophlebitis     following childbirth  . Cerebrovascular disease     extracranial; occlusive  . Ulcer   . Heart murmur   . Dizziness     History  Substance Use Topics  . Smoking status: Former Smoker -- 0.3 packs/day for 15 years    Types: Cigarettes    Quit date: 08/31/1996  . Smokeless tobacco: Never Used  . Alcohol Use: No    Family History  Problem Relation Age of Onset  . Aneurysm Mother     brain  . Heart disease Father   . Heart disease Brother   . Hypertension Other     Allergies  Allergen Reactions  . Atorvastatin   . Codeine   . Demerol   . Oxycodone Hcl   . Rosuvastatin     Current outpatient prescriptions:ALPRAZolam (XANAX) 0.25 MG tablet, Take 0.25 mg by mouth at bedtime as needed.  , Disp: , Rfl: ;  aspirin 81 MG tablet, Take 81 mg by mouth daily.  , Disp: , Rfl: ;  carvedilol (COREG) 6.25 MG tablet, Take 6.25 mg by mouth 2 (two) times daily with a meal.  , Disp: , Rfl: ;  furosemide (LASIX) 20 MG tablet, Take 1 tablet (20 mg total) by mouth as needed., Disp: 30 tablet, Rfl: 4 hydrochlorothiazide 50 MG tablet, Take 1 tablet (50 mg total) by mouth daily., Disp: 90 tablet, Rfl: 3;  simvastatin (ZOCOR) 40 MG tablet, Take 40 mg by mouth at bedtime.  , Disp: , Rfl:   BP 165/74  Pulse 57  Resp 16  Ht 5\' 3"  (1.6 m)  Wt 156 lb 12.8 oz (71.124 kg)  BMI 27.78  kg/m2  Body mass index is 27.78 kg/(m^2).       Review of systems: No change  Physical exam: Well-developed well-nourished white female in no acute distress. Neurologic grossly intact. Carotid arteries without bruits bilaterally. Radial pulses 2+ bilaterally. Equal breath sounds bilaterally.  Carotid duplex: Widely patent right carotid endarterectomy with some postop dilatation of her common carotid artery. Moderate 60-79% left internal carotid artery stenosis.  Impression and plan: No symptoms related to carotid disease. The patient will continue 6 month surveillance and will notify us of any neurologic deficits.

## 2011-06-25 NOTE — Procedures (Unsigned)
CAROTID DUPLEX EXAM  INDICATION:  Followup carotid disease.  HISTORY: Diabetes:  No Cardiac:  Yes Hypertension:  Yes Smoking:  Previous Previous Surgery:  Right CEA 04/21/1991; revision 02/13/2005 due to pseudoaneurysm CV History:  Previous study on 12/02/2010 revealed right mid CCA dilation measuring 1.17 cm x 1.27 cm Amaurosis Fugax No, Paresthesias No, Hemiparesis No                                      RIGHT             LEFT Brachial systolic pressure:         138               138 Brachial Doppler waveforms:         WNL               WNL Vertebral direction of flow:        Antegrade         Antegrade DUPLEX VELOCITIES (cm/sec) CCA peak systolic                   62                97 ECA peak systolic                   Occluded          114 ICA peak systolic                   58                280 ICA end diastolic                   16                78 PLAQUE MORPHOLOGY:                  Heterogeneous     Heterogeneous PLAQUE AMOUNT:                      Mild              Moderate to severe PLAQUE LOCATION:                    CCA               ICA  IMPRESSION: 1. Widely patent right carotid endarterectomy with mild hyperplasia at     the revised carotid endarterectomy site.  Previously reported     common carotid artery aneurysm appears to be normal post surgical     changes, no focal dilation was observed. 2. Occluded right external carotid artery with collateralization. 3. 60%-79% left internal carotid artery stenosis which has increased     since previous exam. 4. Bilateral vertebral arteries are within normal limits.  ___________________________________________ Larina Earthly, M.D.  LT/MEDQ  D:  06/23/2011  T:  06/23/2011  Job:  [id number]

## 2011-07-20 ENCOUNTER — Telehealth: Payer: Self-pay

## 2011-07-20 MED ORDER — CARVEDILOL 6.25 MG PO TABS: 6.2500 mg | ORAL_TABLET | Freq: Two times a day (BID) | ORAL | Status: DC

## 2011-07-20 NOTE — Telephone Encounter (Signed)
Refill sent for carvedilol 6.25 mg take one tablet two times a day.

## 2011-09-01 HISTORY — PX: CATARACT EXTRACTION, BILATERAL: SHX1313

## 2011-10-02 HISTORY — PX: EYE SURGERY: SHX253

## 2011-10-22 ENCOUNTER — Telehealth: Payer: Self-pay | Admitting: Cardiovascular Disease

## 2011-10-22 NOTE — Telephone Encounter (Signed)
LOV,12 faxed to [facility] @ [phone] 10/22/11/KM

## 2011-10-28 ENCOUNTER — Encounter: Payer: Self-pay | Admitting: Cardiovascular Disease

## 2011-10-28 ENCOUNTER — Ambulatory Visit (INDEPENDENT_AMBULATORY_CARE_PROVIDER_SITE_OTHER): Payer: PRIVATE HEALTH INSURANCE | Admitting: Cardiovascular Disease

## 2011-10-28 DIAGNOSIS — I25810 Atherosclerosis of coronary artery bypass graft(s) without angina pectoris: Secondary | ICD-10-CM

## 2011-10-28 DIAGNOSIS — E785 Hyperlipidemia, unspecified: Secondary | ICD-10-CM

## 2011-10-28 DIAGNOSIS — I10 Essential (primary) hypertension: Secondary | ICD-10-CM

## 2011-10-28 DIAGNOSIS — I2510 Atherosclerotic heart disease of native coronary artery without angina pectoris: Secondary | ICD-10-CM

## 2011-10-28 DIAGNOSIS — I739 Peripheral vascular disease, unspecified: Secondary | ICD-10-CM

## 2011-10-28 NOTE — Assessment & Plan Note (Signed)
Currently with no symptoms of angina. No further workup at this time. Continue current medication regimen. 

## 2011-10-28 NOTE — Patient Instructions (Addendum)
You are doing well. No medication changes were made.  Please call us if you have new issues that need to be addressed before your next appt.  Your physician wants you to follow-up in: 6 months.  You will receive a reminder letter in the mail two months in advance. If you don't receive a letter, please call our office to schedule the follow-up appointment.  [facility]: [phone]

## 2011-10-28 NOTE — Assessment & Plan Note (Signed)
Goal LDL less than 70. Continue statin. Zetia could be added if she is not at goal .

## 2011-10-28 NOTE — Assessment & Plan Note (Signed)
Blood pressure is well controlled on today's visit. No changes made to the medications. 

## 2011-10-28 NOTE — Assessment & Plan Note (Signed)
Moderate to severe left carotid arterial disease. Continue aggressive cholesterol management She has followup with Dr. Arbie Cookey.

## 2011-10-28 NOTE — Progress Notes (Signed)
Patient ID: DAVONDA AUSLEY, female    DOB: Aug 07, 1941, 71 y.o.   MRN: [id number]  HPI Comments: Mrs Wilba Mutz is a very pleasant 71 year old woman with history coronary artery disease, bypass surgery, peripheral vascular disease with history of right carotid endarterectomy followed by Dr. Arbie Cookey, who presents for routine followup. Last catheterization in January 2009 showing patent vein grafts, atretic LIMA to the LAD.       Ms. Wanek reports that she has been doing well. She reports that she is tolerating simvastatin with only occasional leg discomfort. Carotid ultrasound shows 50-70% disease on the left. She reports she has been working out at [employer] and her weight is slowly trending downward. She had an episode of shingles last year which has now resolved. She denies any significant chest pain or shortness of breath with exertion    Old EKG shows normal sinus rhythm with rate of 54 beats per minute, no significant ST or T wave changes.        Outpatient Encounter Prescriptions as of 10/28/2011  Medication Sig Dispense Refill  . aspirin 81 MG tablet Take 162 mg by mouth daily.       . carvedilol (COREG) 6.25 MG tablet Take 1 tablet (6.25 mg total) by mouth 2 (two) times daily with a meal.  180 tablet  4  . furosemide (LASIX) 20 MG tablet Take 1 tablet (20 mg total) by mouth as needed.  30 tablet  4  . hydrochlorothiazide 50 MG tablet Take 1 tablet (50 mg total) by mouth daily.  90 tablet  3  . simvastatin (ZOCOR) 40 MG tablet Take 40 mg by mouth at bedtime.        Marland Kitchen VIGAMOX 0.5 % ophthalmic solution       . ALPRAZolam (XANAX) 0.25 MG tablet Take 0.25 mg by mouth at bedtime as needed.            Review of Systems  Constitutional: Negative.   HENT: Negative.   Eyes: Negative.   Respiratory: Negative.   Cardiovascular: Negative.   Gastrointestinal: Negative.   Musculoskeletal: Negative.   Skin: Negative.   Neurological: Negative.   Hematological: Negative.     Psychiatric/Behavioral: Negative.   All other systems reviewed and are negative.    BP 130/68  Pulse 55  Ht 5\' 3"  (1.6 m)  Wt 154 lb (69.854 kg)  BMI 27.28 kg/m2  Physical Exam  Nursing note and vitals reviewed. Constitutional: She is oriented to person, place, and time. She appears well-developed and well-nourished.  HENT:  Head: Normocephalic.  Nose: Nose normal.  Mouth/Throat: Oropharynx is clear and moist.  Eyes: Conjunctivae are normal. Pupils are equal, round, and reactive to light.  Neck: Normal range of motion. Neck supple. No JVD present.  Cardiovascular: Normal rate, regular rhythm, S1 normal, S2 normal, normal heart sounds and intact distal pulses.  Exam reveals no gallop and no friction rub.   No murmur heard. Pulmonary/Chest: Effort normal and breath sounds normal. No respiratory distress. She has no wheezes. She has no rales. She exhibits no tenderness.  Abdominal: Soft. Bowel sounds are normal. She exhibits no distension. There is no tenderness.  Musculoskeletal: Normal range of motion. She exhibits no edema and no tenderness.  Lymphadenopathy:    She has no cervical adenopathy.  Neurological: She is alert and oriented to person, place, and time. Coordination normal.  Skin: Skin is warm and dry. No rash noted. No erythema.  Psychiatric: She has a normal mood and affect.  Her behavior is normal. Judgment and thought content normal.         Assessment and Plan

## 2011-12-15 ENCOUNTER — Other Ambulatory Visit: Payer: Self-pay | Admitting: *Deleted

## 2011-12-15 DIAGNOSIS — Z48812 Encounter for surgical aftercare following surgery on the circulatory system: Secondary | ICD-10-CM

## 2011-12-15 DIAGNOSIS — I6529 Occlusion and stenosis of unspecified carotid artery: Secondary | ICD-10-CM

## 2011-12-21 ENCOUNTER — Encounter: Payer: Self-pay | Admitting: Neurosurgery

## 2011-12-22 ENCOUNTER — Encounter: Payer: Self-pay | Admitting: Neurosurgery

## 2011-12-22 ENCOUNTER — Ambulatory Visit (INDEPENDENT_AMBULATORY_CARE_PROVIDER_SITE_OTHER): Payer: PRIVATE HEALTH INSURANCE | Admitting: Neurosurgery

## 2011-12-22 ENCOUNTER — Other Ambulatory Visit (INDEPENDENT_AMBULATORY_CARE_PROVIDER_SITE_OTHER): Payer: PRIVATE HEALTH INSURANCE | Admitting: *Deleted

## 2011-12-22 VITALS — BP 132/59 | HR 62 | Resp 16 | Ht 63.0 in | Wt 151.7 lb

## 2011-12-22 DIAGNOSIS — Z48812 Encounter for surgical aftercare following surgery on the circulatory system: Secondary | ICD-10-CM

## 2011-12-22 DIAGNOSIS — I6529 Occlusion and stenosis of unspecified carotid artery: Secondary | ICD-10-CM

## 2011-12-22 DIAGNOSIS — I779 Disorder of arteries and arterioles, unspecified: Secondary | ICD-10-CM | POA: Insufficient documentation

## 2011-12-22 NOTE — Progress Notes (Signed)
[facility] HISTORY AND PHYSICAL   CC: Six-month carotid duplex followup Referring Physician: Early  History of Present Illness: This is a 71 year old female patient of Dr. Arbie Cookey, seen for six-month carotid duplex. The patient reports no signs or symptoms of CVA, TIA, diplopia, dysphagia, word finding difficulty or amaurosis fugax. Patient does have a remote history of a right CEA in August of 92 with a revision in June of 2006.  Past Medical History  Diagnosis Date  . Hyperlipidemia   . Coronary artery disease   . Hypertension   . PVD (peripheral vascular disease)     endarterectomy by Dr. Arbie Cookey  . Pre-syncope   . Thrombophlebitis     following childbirth  . Cerebrovascular disease     extracranial; occlusive  . Ulcer   . Heart murmur   . Dizziness     ROS: [x]  Positive   [ ]  Denies    General: [ ]  Weight loss, [ ]  Fever, [ ]  chills Neurologic: [ ]  Dizziness, [ ]  Blackouts, [ ]  Seizure [ ]  Stroke, [ ]  "Mini stroke", [ ]  Slurred speech, [ ]  Temporary blindness; [ ]  weakness in arms or legs, [ ]  Hoarseness Cardiac: [ ]  Chest pain/pressure, [ ]  Shortness of breath at rest [ ]  Shortness of breath with exertion, [ ]  Atrial fibrillation or irregular heartbeat Vascular: [ ]  Pain in legs with walking, [ ]  Pain in legs at rest, [ ]  Pain in legs at night,  [ ]  Non-healing ulcer, [ ]  Blood clot in vein/DVT,   Pulmonary: [ ]  Home oxygen, [ ]  Productive cough, [ ]  Coughing up blood, [ ]  Asthma,  [ ]  Wheezing Musculoskeletal:  [ ]  Arthritis, [ ]  Low back pain, [ ]  Joint pain Hematologic: [ ]  Easy Bruising, [ ]  Anemia; [ ]  Hepatitis Gastrointestinal: [ ]  Blood in stool, [ ]  Gastroesophageal Reflux/heartburn, [ ]  Trouble swallowing Urinary: [ ]  chronic Kidney disease, [ ]  on HD - [ ]  MWF or [ ]  TTHS, [ ]  Burning with urination, [ ]  Difficulty urinating Skin: [ ]  Rashes, [ ]  Wounds Psychological: [ ]  Anxiety, [ ]  Depression   Social History History    Substance Use Topics  . Smoking status: Former Smoker -- 0.3 packs/day for 15 years    Types: Cigarettes    Quit date: 08/31/1996  . Smokeless tobacco: Never Used  . Alcohol Use: No    Family History Family History  Problem Relation Age of Onset  . Aneurysm Mother     brain  . Heart disease Father   . Heart disease Brother   . Hypertension Other     Allergies  Allergen Reactions  . Atorvastatin   . Codeine   . Demerol   . Oxycodone Hcl   . Rosuvastatin     Current Outpatient Prescriptions  Medication Sig Dispense Refill  . ALPRAZolam (XANAX) 0.25 MG tablet Take 0.25 mg by mouth at bedtime as needed.        Marland Kitchen aspirin 81 MG tablet Take 162 mg by mouth daily.       . carvedilol (COREG) 6.25 MG tablet Take 1 tablet (6.25 mg total) by mouth 2 (two) times daily with a meal.  180 tablet  4  . furosemide (LASIX) 20 MG tablet Take 1 tablet (20 mg total) by mouth as needed.  30 tablet  4  . hydrochlorothiazide 50 MG tablet Take 1 tablet (50 mg total) by mouth daily.  90 tablet  3  . simvastatin (ZOCOR) 40 MG tablet Take 40 mg by mouth at bedtime.        Marland Kitchen VIGAMOX 0.5 % ophthalmic solution         Physical Examination  Filed Vitals:   12/22/11 1349  BP: 132/59  Pulse: 62  Resp: 16    Body mass index is 26.87 kg/(m^2).  General:  WDWN in NAD Gait: Normal HEENT: WNL Eyes: Pupils equal Pulmonary: normal non-labored breathing , without Rales, rhonchi,  wheezing Cardiac: RRR, without  Murmurs, rubs or gallops; Abdomen: soft, NT, no masses Skin: no rashes, ulcers noted  Vascular Exam Pulses: Patient has 2+ radial pulses bilaterally Carotid bruits: Carotid bruits are noted bilaterally to auscultation Extremities without ischemic changes, no Gangrene , no cellulitis; no open wounds;  Musculoskeletal: no muscle wasting or atrophy   Neurologic: A&O X 3; Appropriate Affect ; SENSATION: normal; MOTOR FUNCTION:  moving all extremities equally. Speech is  fluent/normal  Non-Invasive Vascular Imaging CAROTID DUPLEX 12/22/2011  Right ICA Occluded Left ICA 60 - 79 % stenosis   ASSESSMENT/PLAN: Assessment as above, we discussed signs and symptoms of CVA, the patient noticed good to the emergency room should any this arise. We will see her back in 6 months for repeat carotid duplex and in my clinic for evaluation. Her questions were encouraged and answered.  Lauree Chandler ANP   Clinic MD: Early

## 2011-12-24 NOTE — Progress Notes (Signed)
Addended by: Sharee Pimple on: 12/24/2011 02:48 PM   Modules accepted: Orders

## 2011-12-31 NOTE — Procedures (Unsigned)
CAROTID DUPLEX EXAM  INDICATION:  Followup carotid artery disease.  HISTORY: Diabetes:  No Cardiac:  Yes Hypertension:  yes Smoking:  Previous Previous Surgery:  Right carotid endarterectomy 04/21/1991, revision performed 02/13/2005 due to pseudoaneurysm. CV History:  Currently asymptomatic Amaurosis Fugax No, Paresthesias No, Hemiparesis No                                      RIGHT             LEFT Brachial systolic pressure:         135               137 Brachial Doppler waveforms:         Normal            Normal Vertebral direction of flow:        Antegrade         Antegrade DUPLEX VELOCITIES (cm/sec) CCA peak systolic                   59                84 ECA peak systolic                   Occluded          214 ICA peak systolic                   73                270 ICA end diastolic                   22                65 PLAQUE MORPHOLOGY:                  Heterogenous      Mixed PLAQUE AMOUNT:                      Moderate          Moderate/severe PLAQUE LOCATION:                    CCA               ICA  IMPRESSION:  Widely patent right carotid endarterectomy site with moderate hyperplasia noted at the revised CEA site. Occluded right ECA with collateralization. Left ICA velocity suggests 60% to 79% stenosis. Bilateral vertebrals are antegrade. Stable in comparison to the last exam.  ___________________________________________ Larina Earthly, M.D.  EM/MEDQ  D:  12/23/2011  T:  12/23/2011  Job:  [id number]

## 2012-03-10 ENCOUNTER — Ambulatory Visit (INDEPENDENT_AMBULATORY_CARE_PROVIDER_SITE_OTHER): Payer: PRIVATE HEALTH INSURANCE | Admitting: Internal Medicine

## 2012-03-10 ENCOUNTER — Encounter: Payer: Self-pay | Admitting: Internal Medicine

## 2012-03-10 ENCOUNTER — Telehealth: Payer: Self-pay | Admitting: *Deleted

## 2012-03-10 VITALS — BP 120/70 | HR 61 | Temp 98.7°F | Ht 62.25 in | Wt 155.2 lb

## 2012-03-10 DIAGNOSIS — F419 Anxiety disorder, unspecified: Secondary | ICD-10-CM

## 2012-03-10 DIAGNOSIS — F411 Generalized anxiety disorder: Secondary | ICD-10-CM

## 2012-03-10 DIAGNOSIS — I10 Essential (primary) hypertension: Secondary | ICD-10-CM

## 2012-03-10 DIAGNOSIS — L659 Nonscarring hair loss, unspecified: Secondary | ICD-10-CM

## 2012-03-10 DIAGNOSIS — F32A Depression, unspecified: Secondary | ICD-10-CM | POA: Insufficient documentation

## 2012-03-10 DIAGNOSIS — E876 Hypokalemia: Secondary | ICD-10-CM

## 2012-03-10 LAB — HM COLONOSCOPY

## 2012-03-10 LAB — CBC WITH DIFFERENTIAL/PLATELET
Basophils Absolute: 0 10*3/uL (ref 0.0–0.1)
Basophils Relative: 0.4 % (ref 0.0–3.0)
Eosinophils Absolute: 0.1 10*3/uL (ref 0.0–0.7)
Eosinophils Relative: 0.9 % (ref 0.0–5.0)
HCT: 34.2 % — ABNORMAL LOW (ref 36.0–46.0)
Hemoglobin: 11.5 g/dL — ABNORMAL LOW (ref 12.0–15.0)
Lymphocytes Relative: 25.2 % (ref 12.0–46.0)
Lymphs Abs: 1.8 10*3/uL (ref 0.7–4.0)
MCHC: 33.5 g/dL (ref 30.0–36.0)
MCV: 85.8 fl (ref 78.0–100.0)
Monocytes Absolute: 0.5 10*3/uL (ref 0.1–1.0)
Monocytes Relative: 6.5 % (ref 3.0–12.0)
Neutro Abs: 4.8 10*3/uL (ref 1.4–7.7)
Neutrophils Relative %: 67 % (ref 43.0–77.0)
Platelets: 315 10*3/uL (ref 150.0–400.0)
RBC: 3.98 Mil/uL (ref 3.87–5.11)
RDW: 14.7 % — ABNORMAL HIGH (ref 11.5–14.6)
WBC: 7.1 10*3/uL (ref 4.5–10.5)

## 2012-03-10 LAB — COMPREHENSIVE METABOLIC PANEL
ALT: 21 U/L (ref 0–35)
AST: 25 U/L (ref 0–37)
Albumin: 4.3 g/dL (ref 3.5–5.2)
Alkaline Phosphatase: 71 U/L (ref 39–117)
BUN: 22 mg/dL (ref 6–23)
CO2: 34 mEq/L — ABNORMAL HIGH (ref 19–32)
Calcium: 9.4 mg/dL (ref 8.4–10.5)
Chloride: 92 mEq/L — ABNORMAL LOW (ref 96–112)
Creatinine, Ser: 0.9 mg/dL (ref 0.4–1.2)
GFR: 62.99 mL/min (ref >60.00)
Glucose, Bld: 121 mg/dL — ABNORMAL HIGH (ref 70–99)
Potassium: 2.5 mEq/L — CL (ref 3.5–5.1)
Sodium: 136 mEq/L (ref 135–145)
Total Bilirubin: 0.6 mg/dL (ref 0.3–1.2)
Total Protein: 7.4 g/dL (ref 6.0–8.3)

## 2012-03-10 LAB — TSH: TSH: 0.84 u[IU]/mL (ref 0.35–5.50)

## 2012-03-10 LAB — HM PAP SMEAR

## 2012-03-10 MED ORDER — POTASSIUM CHLORIDE CRYS ER 20 MEQ PO TBCR: 20.0000 meq | EXTENDED_RELEASE_TABLET | Freq: Every day | ORAL | Status: DC

## 2012-03-10 MED ORDER — CITALOPRAM HYDROBROMIDE 10 MG PO TABS: 10.0000 mg | ORAL_TABLET | Freq: Every day | ORAL | Status: DC

## 2012-03-10 NOTE — Telephone Encounter (Signed)
Lab called with critical potassium of 2.5, verbal given to Dr. Dan Humphreys. She instructed that patient take klorcon x 4 days and recheck the potassium on Monday. Dr. Dan Humphreys sent rx to pharmacy. I tried calling patient to notify her, but was unable to reach her. I left a voicemail letting her know and also advised her to call us once she receives the message. If i do not hear from her I will call her again tomorrow.

## 2012-03-10 NOTE — Assessment & Plan Note (Signed)
BP well controlled today. Will continue current medications. Will check renal function with labs today.

## 2012-03-10 NOTE — Assessment & Plan Note (Signed)
Likely related to stress. However, will check CBC, TSH, CMP with labs. Follow up 1 month.

## 2012-03-10 NOTE — Assessment & Plan Note (Signed)
Recent increase in anxiety secondary to caring for ill family member. Offered support today. Will start Celexa 10mg  daily. Follow up 1 month.

## 2012-03-10 NOTE — Progress Notes (Signed)
Subjective:    Patient ID: Catherine Green, female    DOB: 1941/03/15, 71 y.o.   MRN: [id number]  HPI 71 year old female with history of coronary artery disease status post CABG, hyperlipidemia, hypertension presents to establish care. Her primary concern today is increasing anxiety. 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 She reports increased anxiety level and difficulty sleeping. She had tried taking Xanax for this but notes that she becomes agitated and more angry with the use of Xanax. She reports feeling exhausted because of her responsibilities with her family. She notes that she has had some hair loss over the last few months and questions if this may be secondary to stress.  In regards to her history of coronary artery disease and hypertension she reports full compliance with her medications. She denies any chest pain, shortness of breath.  Outpatient Encounter Prescriptions as of 03/10/2012  Medication Sig Dispense Refill  . aspirin 81 MG tablet Take 162 mg by mouth daily.       . carvedilol (COREG) 6.25 MG tablet Take 1 tablet (6.25 mg total) by mouth 2 (two) times daily with a meal.  180 tablet  4  . furosemide (LASIX) 20 MG tablet Take 1 tablet (20 mg total) by mouth as needed.  30 tablet  4  . hydrochlorothiazide 50 MG tablet Take 1 tablet (50 mg total) by mouth daily.  90 tablet  3  . simvastatin (ZOCOR) 40 MG tablet Take 40 mg by mouth at bedtime.        . citalopram (CELEXA) 10 MG tablet Take 1 tablet (10 mg total) by mouth daily.  30 tablet  3  . potassium chloride SA (K-DUR,KLOR-CON) 20 MEQ tablet Take 1 tablet (20 mEq total) by mouth daily.  4 tablet  0  . DISCONTD: ALPRAZolam (XANAX) 0.25 MG tablet Take 0.25 mg by mouth at bedtime as needed.        Marland Kitchen DISCONTD: VIGAMOX 0.5 % ophthalmic solution        BP 120/70  Pulse 61  Temp 98.7 F (37.1 C) (Oral)  Ht 5' 2.25" (1.581 m)  Wt 155 lb 4 oz (70.421 kg)  BMI 28.17  kg/m2  SpO2 97%  Review of Systems  Constitutional: Positive for fatigue. Negative for fever, chills, appetite change and unexpected weight change.  HENT: Negative for ear pain, congestion, sore throat, trouble swallowing, neck pain, voice change and sinus pressure.   Eyes: Negative for visual disturbance.  Respiratory: Negative for cough, shortness of breath, wheezing and stridor.   Cardiovascular: Negative for chest pain, palpitations and leg swelling.  Gastrointestinal: Negative for nausea, vomiting, abdominal pain, diarrhea, constipation, blood in stool, abdominal distention and anal bleeding.  Genitourinary: Negative for dysuria and flank pain.  Musculoskeletal: Negative for myalgias, arthralgias and gait problem.  Skin: Negative for color change and rash.  Neurological: Negative for dizziness and headaches.  Hematological: Negative for adenopathy. Does not bruise/bleed easily.  Psychiatric/Behavioral: Positive for disturbed wake/sleep cycle. Negative for suicidal ideas and dysphoric mood. The patient is nervous/anxious.        Objective:   Physical Exam  Constitutional: She is oriented to person, place, and time. She appears well-developed and well-nourished. No distress.  HENT:  Head: Normocephalic and atraumatic.  Right Ear: External ear normal.  Left Ear: External ear normal.  Nose: Nose normal.  Mouth/Throat: Oropharynx is clear and moist. No oropharyngeal exudate.  Eyes: Conjunctivae are normal. Pupils  are equal, round, and reactive to light. Right eye exhibits no discharge. Left eye exhibits no discharge. No scleral icterus.  Neck: Normal range of motion. Neck supple. No tracheal deviation present. No thyromegaly present.  Cardiovascular: Normal rate, regular rhythm, normal heart sounds and intact distal pulses.  Exam reveals no gallop and no friction rub.   No murmur heard. Pulmonary/Chest: Effort normal and breath sounds normal. No respiratory distress. She has no  wheezes. She has no rales. She exhibits no tenderness.  Abdominal: Soft. Bowel sounds are normal. She exhibits no distension and no mass. There is no tenderness. There is no guarding.  Musculoskeletal: Normal range of motion. She exhibits no edema and no tenderness.  Lymphadenopathy:    She has no cervical adenopathy.  Neurological: She is alert and oriented to person, place, and time. No cranial nerve deficit. She exhibits normal muscle tone. Coordination normal.  Skin: Skin is warm and dry. No rash noted. She is not diaphoretic. No erythema. No pallor.  Psychiatric: She has a normal mood and affect. Her behavior is normal. Judgment and thought content normal.          Assessment & Plan:

## 2012-03-11 LAB — FERRITIN: Ferritin: 80.9 ng/mL (ref 10.0–291.0)

## 2012-03-11 NOTE — Telephone Encounter (Signed)
Tried calling patient again, but still got no answer. Left another message asking for her to call back.

## 2012-03-11 NOTE — Telephone Encounter (Signed)
Patient notified. She will be in on Monday for the stool kit an potassium.

## 2012-03-14 ENCOUNTER — Other Ambulatory Visit (INDEPENDENT_AMBULATORY_CARE_PROVIDER_SITE_OTHER): Payer: Medicare Other | Admitting: *Deleted

## 2012-03-14 DIAGNOSIS — I10 Essential (primary) hypertension: Secondary | ICD-10-CM

## 2012-03-14 LAB — COMPREHENSIVE METABOLIC PANEL
ALT: 19 U/L (ref 0–35)
AST: 21 U/L (ref 0–37)
Albumin: 4.1 g/dL (ref 3.5–5.2)
Alkaline Phosphatase: 70 U/L (ref 39–117)
BUN: 17 mg/dL (ref 6–23)
CO2: 33 mEq/L — ABNORMAL HIGH (ref 19–32)
Calcium: 9.4 mg/dL (ref 8.4–10.5)
Chloride: 93 mEq/L — ABNORMAL LOW (ref 96–112)
Creatinine, Ser: 1 mg/dL (ref 0.4–1.2)
GFR: 55.36 mL/min — ABNORMAL LOW (ref >60.00)
Glucose, Bld: 107 mg/dL — ABNORMAL HIGH (ref 70–99)
Potassium: 3 mEq/L — ABNORMAL LOW (ref 3.5–5.1)
Sodium: 137 mEq/L (ref 135–145)
Total Bilirubin: 0.3 mg/dL (ref 0.3–1.2)
Total Protein: 7.2 g/dL (ref 6.0–8.3)

## 2012-03-15 ENCOUNTER — Other Ambulatory Visit: Payer: PRIVATE HEALTH INSURANCE

## 2012-03-21 ENCOUNTER — Other Ambulatory Visit: Payer: Self-pay | Admitting: Internal Medicine

## 2012-03-21 DIAGNOSIS — Z1211 Encounter for screening for malignant neoplasm of colon: Secondary | ICD-10-CM

## 2012-03-25 ENCOUNTER — Other Ambulatory Visit: Payer: Medicare Other

## 2012-03-25 DIAGNOSIS — Z1211 Encounter for screening for malignant neoplasm of colon: Secondary | ICD-10-CM

## 2012-03-25 LAB — FECAL OCCULT BLOOD, IMMUNOCHEMICAL: Fecal Occult Bld: NEGATIVE

## 2012-03-28 ENCOUNTER — Other Ambulatory Visit: Payer: Self-pay | Admitting: *Deleted

## 2012-03-28 MED ORDER — HYDROCHLOROTHIAZIDE 50 MG PO TABS: 50.0000 mg | ORAL_TABLET | Freq: Every day | ORAL | Status: DC

## 2012-03-28 NOTE — Telephone Encounter (Signed)
Refilled Hydrochlorothiazide. 

## 2012-04-11 ENCOUNTER — Encounter: Payer: Self-pay | Admitting: Internal Medicine

## 2012-04-11 ENCOUNTER — Ambulatory Visit (INDEPENDENT_AMBULATORY_CARE_PROVIDER_SITE_OTHER): Payer: Medicare Other | Admitting: Internal Medicine

## 2012-04-11 VITALS — BP 130/70 | HR 57 | Temp 98.5°F | Ht 62.25 in | Wt 154.0 lb

## 2012-04-11 DIAGNOSIS — E785 Hyperlipidemia, unspecified: Secondary | ICD-10-CM

## 2012-04-11 DIAGNOSIS — F419 Anxiety disorder, unspecified: Secondary | ICD-10-CM

## 2012-04-11 DIAGNOSIS — R42 Dizziness and giddiness: Secondary | ICD-10-CM

## 2012-04-11 DIAGNOSIS — F411 Generalized anxiety disorder: Secondary | ICD-10-CM

## 2012-04-11 DIAGNOSIS — Z79899 Other long term (current) drug therapy: Secondary | ICD-10-CM

## 2012-04-11 LAB — COMPREHENSIVE METABOLIC PANEL
ALT: 22 U/L (ref 0–35)
AST: 23 U/L (ref 0–37)
Albumin: 4.4 g/dL (ref 3.5–5.2)
Alkaline Phosphatase: 73 U/L (ref 39–117)
BUN: 15 mg/dL (ref 6–23)
CO2: 32 mEq/L (ref 19–32)
Calcium: 9.7 mg/dL (ref 8.4–10.5)
Chloride: 92 mEq/L — ABNORMAL LOW (ref 96–112)
Creatinine, Ser: 0.8 mg/dL (ref 0.4–1.2)
GFR: 77.14 mL/min (ref >60.00)
Glucose, Bld: 99 mg/dL (ref 70–99)
Potassium: 3.3 mEq/L — ABNORMAL LOW (ref 3.5–5.1)
Sodium: 135 mEq/L (ref 135–145)
Total Bilirubin: 0.6 mg/dL (ref 0.3–1.2)
Total Protein: 7.6 g/dL (ref 6.0–8.3)

## 2012-04-11 LAB — CBC WITH DIFFERENTIAL/PLATELET
Basophils Absolute: 0 10*3/uL (ref 0.0–0.1)
Basophils Relative: 0.5 % (ref 0.0–3.0)
Eosinophils Absolute: 0 10*3/uL (ref 0.0–0.7)
Eosinophils Relative: 0.7 % (ref 0.0–5.0)
HCT: 37.5 % (ref 36.0–46.0)
Hemoglobin: 12.4 g/dL (ref 12.0–15.0)
Lymphocytes Relative: 26.2 % (ref 12.0–46.0)
Lymphs Abs: 1.6 10*3/uL (ref 0.7–4.0)
MCHC: 33.2 g/dL (ref 30.0–36.0)
MCV: 85.9 fl (ref 78.0–100.0)
Monocytes Absolute: 0.4 10*3/uL (ref 0.1–1.0)
Monocytes Relative: 7.3 % (ref 3.0–12.0)
Neutro Abs: 4 10*3/uL (ref 1.4–7.7)
Neutrophils Relative %: 65.3 % (ref 43.0–77.0)
Platelets: 307 10*3/uL (ref 150.0–400.0)
RBC: 4.36 Mil/uL (ref 3.87–5.11)
RDW: 14.5 % (ref 11.5–14.6)
WBC: 6.2 10*3/uL (ref 4.5–10.5)

## 2012-04-11 LAB — LDL CHOLESTEROL, DIRECT: Direct LDL: 139.1 mg/dL

## 2012-04-11 LAB — LIPID PANEL
Cholesterol: 225 mg/dL — ABNORMAL HIGH (ref 0–200)
HDL: 65.1 mg/dL (ref >39.00)
Total CHOL/HDL Ratio: 3
Triglycerides: 214 mg/dL — ABNORMAL HIGH (ref 0.0–149.0)
VLDL: 42.8 mg/dL — ABNORMAL HIGH (ref 0.0–40.0)

## 2012-04-11 LAB — VITAMIN B12: Vitamin B-12: 658 pg/mL (ref 211–911)

## 2012-04-11 MED ORDER — BUPROPION HCL ER (XL) 150 MG PO TB24: 150.0000 mg | ORAL_TABLET | Freq: Every day | ORAL | Status: DC

## 2012-04-11 MED ORDER — ROSUVASTATIN CALCIUM 10 MG PO TABS: 10.0000 mg | ORAL_TABLET | Freq: Every day | ORAL | Status: DC

## 2012-04-11 NOTE — Assessment & Plan Note (Signed)
Patient stopped her simvastatin. Discussed need for use of statin medication given her history of coronary artery disease. Will try changing to Crestor 10 mg daily. Will recheck lipids today. We'll plan to also recheck lipids and LFTs again in one month. Goal LDL less than 70.

## 2012-04-11 NOTE — Progress Notes (Signed)
Subjective:    Patient ID: Catherine Green, female    DOB: 09-14-1940, 71 y.o.   MRN: [id number]  HPI 71 year old female with history of coronary artery disease, hypertension, hyperlipidemia, and depression presents for followup. At her last visit, we started Celexa to help with depression. She reports feeling fatigued on this medication. She does not feel that has made an improvement in her symptoms of depressed mood or anxiety. She reports continued stressors at home dealing with family issues and caring for family members. She is not interested in counseling. She reports feeling as if she would like to stay in bed most of the time. She denies suicidal ideation.  In regards to history of hyperlipidemia, she reports that she stopped taking her simvastatin a few weeks ago. She felt that this medication was leading to hair loss. She feels that her symptoms of hair loss have improved with stopping the medicine. She reports she's been trying to improve her diet to help control her cholesterol.  She is also concerned today about some intermittent dizziness. She reports that when standing in the morning she sometimes feels lightheaded. She denies any chest pain, diaphoresis, shortness of breath. She denies any focal neurologic symptoms. She questions if this symptom may be related to anxiety.  Outpatient Encounter Prescriptions as of 04/11/2012  Medication Sig Dispense Refill  . aspirin 81 MG tablet Take 162 mg by mouth daily.       . carvedilol (COREG) 6.25 MG tablet Take 1 tablet (6.25 mg total) by mouth 2 (two) times daily with a meal.  180 tablet  4  . hydrochlorothiazide (HYDRODIURIL) 50 MG tablet Take 1 tablet (50 mg total) by mouth daily.  90 tablet  3  . potassium chloride SA (K-DUR,KLOR-CON) 20 MEQ tablet Take 1 tablet (20 mEq total) by mouth daily.  4 tablet  0  . DISCONTD: citalopram (CELEXA) 10 MG tablet Take 1 tablet (10 mg total) by mouth daily.  30 tablet  3  . DISCONTD: furosemide (LASIX)  20 MG tablet Take 1 tablet (20 mg total) by mouth as needed.  30 tablet  4  . DISCONTD: simvastatin (ZOCOR) 40 MG tablet Take 40 mg by mouth at bedtime.        Marland Kitchen buPROPion (WELLBUTRIN XL) 150 MG 24 hr tablet Take 1 tablet (150 mg total) by mouth daily.  30 tablet  3  . rosuvastatin (CRESTOR) 10 MG tablet Take 1 tablet (10 mg total) by mouth daily.  90 tablet  3   Review of Systems  Constitutional: Negative for fever, chills, appetite change, fatigue and unexpected weight change.  HENT: Negative for ear pain, congestion, sore throat, trouble swallowing, neck pain, voice change and sinus pressure.   Eyes: Negative for visual disturbance.  Respiratory: Negative for cough, shortness of breath, wheezing and stridor.   Cardiovascular: Negative for chest pain, palpitations and leg swelling.  Gastrointestinal: Negative for nausea, vomiting, abdominal pain, diarrhea, constipation, blood in stool, abdominal distention and anal bleeding.  Genitourinary: Negative for dysuria and flank pain.  Musculoskeletal: Negative for myalgias, arthralgias and gait problem.  Skin: Negative for color change and rash.  Neurological: Positive for dizziness and light-headedness. Negative for tremors, syncope, speech difficulty, weakness, numbness and headaches.  Hematological: Negative for adenopathy. Does not bruise/bleed easily.  Psychiatric/Behavioral: Positive for dysphoric mood. Negative for suicidal ideas and disturbed wake/sleep cycle. The patient is nervous/anxious.       BP 130/70  Pulse 57  Temp 98.5 F (36.9 C) (Oral)  Ht 5' 2.25" (1.581 m)  Wt 154 lb (69.854 kg)  BMI 27.94 kg/m2  SpO2 97%  Objective:   Physical Exam  Constitutional: She is oriented to person, place, and time. She appears well-developed and well-nourished. No distress.  HENT:  Head: Normocephalic and atraumatic.  Right Ear: External ear normal.  Left Ear: External ear normal.  Nose: Nose normal.  Mouth/Throat: Oropharynx is clear  and moist. No oropharyngeal exudate.  Eyes: Conjunctivae are normal. Pupils are equal, round, and reactive to light. Right eye exhibits no discharge. Left eye exhibits no discharge. No scleral icterus.  Neck: Normal range of motion. Neck supple. No tracheal deviation present. No thyromegaly present.  Cardiovascular: Normal rate, regular rhythm, normal heart sounds and intact distal pulses.  Exam reveals no gallop and no friction rub.   No murmur heard. Pulmonary/Chest: Effort normal and breath sounds normal. No respiratory distress. She has no wheezes. She has no rales. She exhibits no tenderness.  Musculoskeletal: Normal range of motion. She exhibits no edema and no tenderness.  Lymphadenopathy:    She has no cervical adenopathy.  Neurological: She is alert and oriented to person, place, and time. No cranial nerve deficit. She exhibits normal muscle tone. Coordination normal.  Skin: Skin is warm and dry. No rash noted. She is not diaphoretic. No erythema. No pallor.  Psychiatric: Her speech is normal and behavior is normal. Judgment and thought content normal. Her mood appears anxious. Cognition and memory are normal. She exhibits a depressed mood.          Assessment & Plan:

## 2012-04-11 NOTE — Assessment & Plan Note (Signed)
Suspect this may be related to anxiety. However, given that she was hypokalemic on previous labs, will recheck electrolytes today. We'll also check B12. TSH was normal last month.

## 2012-04-11 NOTE — Assessment & Plan Note (Signed)
Symptoms poorly controlled on Celexa. Will try changing to Wellbutrin. Encourage patient to consider counseling, however she declines at this time. Followup one month.

## 2012-04-22 ENCOUNTER — Other Ambulatory Visit: Payer: Medicare Other

## 2012-04-25 ENCOUNTER — Telehealth: Payer: Self-pay | Admitting: Internal Medicine

## 2012-04-25 NOTE — Telephone Encounter (Signed)
Patient calling, started on Crestor and Welbutrin.  After taking for several days she noticed jerking movements 'like my body couldn't function'.  She stopped taking both medications and the symptoms have stopped.  It left her weak all over.  Today is the first day that she feels more normal.   States that she took Crestor previously and couldn't take it then but agreed to try it again.  She has an appointment in am , Tuesday 8/27 for lab work.   Denies any symptoms today but had dizziness while on the medication.  She has lost 4# in the past week.  Has no appetite.  She is drinking fluids and urinating.

## 2012-04-26 ENCOUNTER — Other Ambulatory Visit (INDEPENDENT_AMBULATORY_CARE_PROVIDER_SITE_OTHER): Payer: Medicare Other

## 2012-04-26 DIAGNOSIS — I10 Essential (primary) hypertension: Secondary | ICD-10-CM

## 2012-04-26 LAB — BASIC METABOLIC PANEL
BUN: 14 mg/dL (ref 6–23)
CO2: 30 mEq/L (ref 19–32)
Calcium: 9.9 mg/dL (ref 8.4–10.5)
Chloride: 98 mEq/L (ref 96–112)
Creatinine, Ser: 0.9 mg/dL (ref 0.4–1.2)
GFR: 65.39 mL/min (ref >60.00)
Glucose, Bld: 97 mg/dL (ref 70–99)
Potassium: 4.2 mEq/L (ref 3.5–5.1)
Sodium: 138 mEq/L (ref 135–145)

## 2012-05-04 ENCOUNTER — Encounter: Payer: Self-pay | Admitting: Cardiovascular Disease

## 2012-05-04 ENCOUNTER — Ambulatory Visit (INDEPENDENT_AMBULATORY_CARE_PROVIDER_SITE_OTHER): Payer: Medicare Other | Admitting: Cardiovascular Disease

## 2012-05-04 VITALS — BP 132/68 | HR 55 | Ht 62.0 in | Wt 156.0 lb

## 2012-05-04 DIAGNOSIS — I25810 Atherosclerosis of coronary artery bypass graft(s) without angina pectoris: Secondary | ICD-10-CM

## 2012-05-04 DIAGNOSIS — I6529 Occlusion and stenosis of unspecified carotid artery: Secondary | ICD-10-CM

## 2012-05-04 DIAGNOSIS — E785 Hyperlipidemia, unspecified: Secondary | ICD-10-CM | POA: Insufficient documentation

## 2012-05-04 NOTE — Patient Instructions (Addendum)
You are doing well. Please try crestor 5 mg every other day Wait for several weeks, then try 5 mg every day  Please call us if you have new issues that need to be addressed before your next appt.  Your physician wants you to follow-up in: 6 months.  You will receive a reminder letter in the mail two months in advance. If you don't receive a letter, please call our office to schedule the follow-up appointment.

## 2012-05-04 NOTE — Progress Notes (Signed)
Patient ID: [name], female    DOB: 1941-03-28, 71 y.o.   MRN: [id number]  HPI Comments: [name] is a very pleasant 71 year old woman with history coronary artery disease, bypass surgery, peripheral vascular disease with history of right carotid endarterectomy followed by Dr. Arbie Cookey, who presents for routine followup. Last catheterization in January 2009 showing patent vein grafts, atretic LIMA to the LAD.     Carotid ultrasound shows 50-70% disease on the left. She has been unable to tolerate Crestor because it causes stomach problems. Lipitor causes leg problems. Simvastatin causes her hair to fall out in sores on her head. She has tried Vytorin in the past, and when other statins are mentioned, she has tried these as well. She is not particularly interested in fenofibrate, WelChol. She has had problems with zetia.   She denies any significant shortness of breath or chest pain. No other complaints.    EKG shows normal sinus rhythm with rate of 55 beats per minute, no significant ST or T wave changes.        Outpatient Encounter Prescriptions as of 05/04/2012  Medication Sig Dispense Refill  . aspirin 81 MG tablet Take 162 mg by mouth daily.       . carvedilol (COREG) 6.25 MG tablet Take 1 tablet (6.25 mg total) by mouth 2 (two) times daily with a meal.  180 tablet  4  . hydrochlorothiazide (HYDRODIURIL) 25 MG tablet Take 25 mg by mouth daily.      . Multiple Vitamin (MULTIVITAMIN) tablet Take 1 tablet by mouth daily.      . Omega-3 Fatty Acids (FISH OIL) 1000 MG CAPS Take 1,000 mg by mouth daily.      . Pediatric Multiple Vit-C-FA (FLINSTONES GUMMIES OMEGA-3 DHA) CHEW Chew by mouth daily.      Marland Kitchen thiamine (VITAMIN B-1) 100 MG tablet Take 100 mg by mouth daily.      . rosuvastatin (CRESTOR) 5 MG tablet Take 1 tablet (5 mg total) by mouth every other day.  28 tablet    . DISCONTD: buPROPion (WELLBUTRIN XL) 150 MG 24 hr tablet Take 1 tablet (150 mg total) by mouth daily.  30  tablet  3  . DISCONTD: hydrochlorothiazide (HYDRODIURIL) 50 MG tablet Take 1 tablet (50 mg total) by mouth daily.  90 tablet  3  . DISCONTD: potassium chloride SA (K-DUR,KLOR-CON) 20 MEQ tablet Take 1 tablet (20 mEq total) by mouth daily.  4 tablet  0  . DISCONTD: rosuvastatin (CRESTOR) 10 MG tablet Take 1 tablet (10 mg total) by mouth daily.  90 tablet  3      Review of Systems  Constitutional: Negative.   HENT: Negative.   Eyes: Negative.   Respiratory: Negative.   Cardiovascular: Negative.   Gastrointestinal: Negative.   Musculoskeletal: Negative.   Skin: Negative.   Neurological: Negative.   Hematological: Negative.   Psychiatric/Behavioral: Negative.   All other systems reviewed and are negative.    BP 132/68  Pulse 55  Ht 5\' 2"  (1.575 m)  Wt 156 lb (70.761 kg)  BMI 28.53 kg/m2  Physical Exam  Nursing note and vitals reviewed. Constitutional: She is oriented to person, place, and time. She appears well-developed and well-nourished.  HENT:  Head: Normocephalic.  Nose: Nose normal.  Mouth/Throat: Oropharynx is clear and moist.  Eyes: Conjunctivae are normal. Pupils are equal, round, and reactive to light.  Neck: Normal range of motion. Neck supple. No JVD present.  Cardiovascular: Normal rate, regular rhythm,  S1 normal, S2 normal, normal heart sounds and intact distal pulses.  Exam reveals no gallop and no friction rub.   No murmur heard. Pulmonary/Chest: Effort normal and breath sounds normal. No respiratory distress. She has no wheezes. She has no rales. She exhibits no tenderness.  Abdominal: Soft. Bowel sounds are normal. She exhibits no distension. There is no tenderness.  Musculoskeletal: Normal range of motion. She exhibits no edema and no tenderness.  Lymphadenopathy:    She has no cervical adenopathy.  Neurological: She is alert and oriented to person, place, and time. Coordination normal.  Skin: Skin is warm and dry. No rash noted. No erythema.    Psychiatric: She has a normal mood and affect. Her behavior is normal. Judgment and thought content normal.         Assessment and Plan

## 2012-05-04 NOTE — Assessment & Plan Note (Signed)
She is followed in [place]. We have stressed to her the importance of close followup with ultrasound later this year. We have also stressed the importance of better lipid control.

## 2012-05-04 NOTE — Assessment & Plan Note (Signed)
She is willing to try Crestor 5 mg every other day, possibly titrating to 5 mg daily if tolerated.

## 2012-05-04 NOTE — Assessment & Plan Note (Signed)
Currently with no symptoms of angina. No further workup at this time. Continue current medication regimen. 

## 2012-05-27 ENCOUNTER — Ambulatory Visit (INDEPENDENT_AMBULATORY_CARE_PROVIDER_SITE_OTHER): Payer: Medicare Other | Admitting: Internal Medicine

## 2012-05-27 ENCOUNTER — Ambulatory Visit (INDEPENDENT_AMBULATORY_CARE_PROVIDER_SITE_OTHER)
Admission: RE | Admit: 2012-05-27 | Discharge: 2012-05-27 | Disposition: A | Discharge status: Home / Self Care | Payer: Medicare Other | Source: Ambulatory Visit | Attending: Internal Medicine | Admitting: Internal Medicine

## 2012-05-27 ENCOUNTER — Encounter: Payer: Self-pay | Admitting: Internal Medicine

## 2012-05-27 VITALS — BP 130/60 | HR 60 | Temp 98.6°F | Wt 157.0 lb

## 2012-05-27 DIAGNOSIS — Z23 Encounter for immunization: Secondary | ICD-10-CM

## 2012-05-27 DIAGNOSIS — Z1211 Encounter for screening for malignant neoplasm of colon: Secondary | ICD-10-CM | POA: Insufficient documentation

## 2012-05-27 DIAGNOSIS — M545 Low back pain, unspecified: Secondary | ICD-10-CM

## 2012-05-27 DIAGNOSIS — M79604 Pain in right leg: Secondary | ICD-10-CM

## 2012-05-27 DIAGNOSIS — R42 Dizziness and giddiness: Secondary | ICD-10-CM

## 2012-05-27 NOTE — Assessment & Plan Note (Signed)
Patient is concerned about low back pain radiating to both legs. Symptoms are consistent with sciatic pain. Plain film shows degenerative changes throughout the lumbar spine. Would prefer to get MRI of lumbar spine for further evaluation and set up orthopedic referral. Continue tylenol prn pain.

## 2012-05-27 NOTE — Assessment & Plan Note (Signed)
Will set up screening colonoscopy. 

## 2012-05-27 NOTE — Progress Notes (Signed)
Subjective:    Patient ID: Catherine Green, female    DOB: Apr 23, 1941, 71 y.o.   MRN: [id number]  HPI 71 year old female with history of coronary artery disease, hypertension, hyperlipidemia presents for followup. At her last visit, she was concerned about episodes of dizziness. She reports that symptoms of dizziness have completely resolved. Her primary concern today is lower back pain which radiates down the back of her legs. This pain is described as aching that comes and goes. It is alleviated partially with use of Tylenol. She reports that she was evaluated by a nurse from her insurance company who recommended that she perform exercises and when she lay flat on her abdomen and then lift her legs in the air. She reports that this exercise seems to exacerbate her pain. She denies any injury to her back. She denies any loss of control of bowel or bladder. She denies weakness in her legs.  She also notes that she talked to the nurse from her insurance company about setting up screening colonoscopy. At her last visit, we had discussed this and she has declined. However, she feels that she would like to proceed with screening colonoscopy at this point. She would also like to be evaluated by GI physician for upper endoscopy given episodes of intermittent abdominal distention and gas.  Outpatient Encounter Prescriptions as of 05/27/2012  Medication Sig Dispense Refill  . aspirin 81 MG tablet Take 162 mg by mouth daily.       . carvedilol (COREG) 6.25 MG tablet Take 1 tablet (6.25 mg total) by mouth 2 (two) times daily with a meal.  180 tablet  4  . hydrochlorothiazide (HYDRODIURIL) 25 MG tablet Take 25 mg by mouth daily.      . Multiple Vitamin (MULTIVITAMIN) tablet Take 1 tablet by mouth daily.      . Omega-3 Fatty Acids (FISH OIL) 1000 MG CAPS Take 1,000 mg by mouth daily.      . Pediatric Multiple Vit-C-FA (FLINSTONES GUMMIES OMEGA-3 DHA) Green Green by mouth daily.      . rosuvastatin (CRESTOR) 5 MG  tablet Take 1 tablet (5 mg total) by mouth every other day.  28 tablet    . thiamine (VITAMIN B-1) 100 MG tablet Take 100 mg by mouth daily.       BP 130/60  Pulse 60  Temp 98.6 F (37 C) (Oral)  Wt 157 lb (71.215 kg)  Review of Systems  Constitutional: Negative for fever, chills, appetite change, fatigue and unexpected weight change.  HENT: Negative for ear pain, congestion, sore throat, trouble swallowing, neck pain, voice change and sinus pressure.   Eyes: Negative for visual disturbance.  Respiratory: Negative for cough, shortness of breath, wheezing and stridor.   Cardiovascular: Negative for chest pain, palpitations and leg swelling.  Gastrointestinal: Positive for abdominal distention. Negative for nausea, vomiting, abdominal pain, diarrhea, constipation, blood in stool and anal bleeding.  Genitourinary: Negative for dysuria and flank pain.  Musculoskeletal: Positive for myalgias, back pain and arthralgias. Negative for gait problem.  Skin: Negative for color change and rash.  Neurological: Negative for dizziness and headaches.  Hematological: Negative for adenopathy. Does not bruise/bleed easily.  Psychiatric/Behavioral: Negative for suicidal ideas, disturbed wake/sleep cycle and dysphoric mood. The patient is not nervous/anxious.        Objective:   Physical Exam  Constitutional: She is oriented to person, place, and time. She appears well-developed and well-nourished. No distress.  HENT:  Head: Normocephalic and atraumatic.  Right Ear: External  ear normal.  Left Ear: External ear normal.  Nose: Nose normal.  Mouth/Throat: Oropharynx is clear and moist. No oropharyngeal exudate.  Eyes: Conjunctivae normal are normal. Pupils are equal, round, and reactive to light. Right eye exhibits no discharge. Left eye exhibits no discharge. No scleral icterus.  Neck: Normal range of motion. Neck supple. No tracheal deviation present. No thyromegaly present.  Cardiovascular: Normal  rate, regular rhythm, normal heart sounds and intact distal pulses.  Exam reveals no gallop and no friction rub.   No murmur heard. Pulmonary/Chest: Effort normal and breath sounds normal. No respiratory distress. She has no wheezes. She has no rales. She exhibits no tenderness.  Abdominal: Soft. Bowel sounds are normal. She exhibits no distension. There is no tenderness.  Musculoskeletal: Normal range of motion. She exhibits no edema and no tenderness.       Lumbar back: She exhibits pain and spasm. She exhibits normal range of motion and no tenderness.  Lymphadenopathy:    She has no cervical adenopathy.  Neurological: She is alert and oriented to person, place, and time. No cranial nerve deficit. She exhibits normal muscle tone. Coordination normal.  Skin: Skin is warm and dry. No rash noted. She is not diaphoretic. No erythema. No pallor.  Psychiatric: She has a normal mood and affect. Her behavior is normal. Judgment and thought content normal.          Assessment & Plan:

## 2012-05-27 NOTE — Assessment & Plan Note (Signed)
Dizziness has improved without intervention. Will continue to monitor.

## 2012-05-30 ENCOUNTER — Ambulatory Visit (INDEPENDENT_AMBULATORY_CARE_PROVIDER_SITE_OTHER): Payer: Medicare Other | Admitting: Internal Medicine

## 2012-05-30 DIAGNOSIS — E538 Deficiency of other specified B group vitamins: Secondary | ICD-10-CM

## 2012-05-30 MED ORDER — CYANOCOBALAMIN 1000 MCG/ML IJ SOLN
1000.0000 ug | Freq: Once | INTRAMUSCULAR | Status: AC
  Administered 2012-05-30: 1000 ug via INTRAMUSCULAR

## 2012-05-30 NOTE — Progress Notes (Signed)
  Subjective:    Patient ID: Catherine Green, female    DOB: 1941/01/13, 71 y.o.   MRN: [id number]  HPI Nurse visit only    Review of Systems     Objective:   Physical Exam        Assessment & Plan:

## 2012-06-13 ENCOUNTER — Encounter: Payer: Self-pay | Admitting: Neurosurgery

## 2012-06-14 ENCOUNTER — Ambulatory Visit (INDEPENDENT_AMBULATORY_CARE_PROVIDER_SITE_OTHER): Payer: Medicare Other | Admitting: Neurosurgery

## 2012-06-14 ENCOUNTER — Other Ambulatory Visit (INDEPENDENT_AMBULATORY_CARE_PROVIDER_SITE_OTHER): Payer: Medicare Other | Admitting: *Deleted

## 2012-06-14 ENCOUNTER — Encounter: Payer: Self-pay | Admitting: Neurosurgery

## 2012-06-14 VITALS — BP 132/60 | HR 58 | Resp 16 | Ht 62.25 in | Wt 156.0 lb

## 2012-06-14 DIAGNOSIS — Z48812 Encounter for surgical aftercare following surgery on the circulatory system: Secondary | ICD-10-CM

## 2012-06-14 DIAGNOSIS — I6529 Occlusion and stenosis of unspecified carotid artery: Secondary | ICD-10-CM

## 2012-06-14 NOTE — Addendum Note (Signed)
Addended by: Sharee Pimple on: 06/14/2012 10:55 AM   Modules accepted: Orders

## 2012-06-14 NOTE — Progress Notes (Signed)
[facility] Carotid Office Note  CC: Six-month carotid surveillance Referring Physician: Early  History of Present Illness: 71 year old female patient of Dr. Arbie Cookey status post right CEA in 1992 with a revision in 06. The patient denies signs or symptoms of CVA, TIA, amaurosis fugax or any neural deficit. The patient does state she had a recent cataract surgery and has had some resultant headaches.  Past Medical History  Diagnosis Date  . Hyperlipidemia   . Coronary artery disease   . Hypertension   . PVD (peripheral vascular disease)     endarterectomy by Dr. Arbie Cookey  . Pre-syncope   . Thrombophlebitis     following childbirth  . Cerebrovascular disease     extracranial; occlusive  . Ulcer   . Heart murmur   . Dizziness   . Chicken pox   . Depression   . Fainting spell   . Allergy     hay fever  . Heart murmur     ROS: [x]  Positive   [ ]  Denies    General: [ ]  Weight loss, [ ]  Fever, [ ]  chills Neurologic: [ ]  Dizziness, [ ]  Blackouts, [ ]  Seizure [ ]  Stroke, [ ]  "Mini stroke", [ ]  Slurred speech, [ ]  Temporary blindness; [ ]  weakness in arms or legs, [ ]  Hoarseness Cardiac: [ ]  Chest pain/pressure, [ ]  Shortness of breath at rest [ ]  Shortness of breath with exertion, [ ]  Atrial fibrillation or irregular heartbeat Vascular: [ ]  Pain in legs with walking, [ ]  Pain in legs at rest, [ ]  Pain in legs at night,  [ ]  Non-healing ulcer, [ ]  Blood clot in vein/DVT,   Pulmonary: [ ]  Home oxygen, [ ]  Productive cough, [ ]  Coughing up blood, [ ]  Asthma,  [ ]  Wheezing Musculoskeletal:  [ ]  Arthritis, [ ]  Low back pain, [ ]  Joint pain Hematologic: [ ]  Easy Bruising, [ ]  Anemia; [ ]  Hepatitis Gastrointestinal: [ ]  Blood in stool, [ ]  Gastroesophageal Reflux/heartburn, [ ]  Trouble swallowing Urinary: [ ]  chronic Kidney disease, [ ]  on HD - [ ]  MWF or [ ]  TTHS, [ ]  Burning with urination, [ ]  Difficulty urinating Skin: [ ]  Rashes, [ ]  Wounds Psychological: [  ] Anxiety, [ ]  Depression   Social History History  Substance Use Topics  . Smoking status: Former Smoker -- 0.3 packs/day for 15 years    Types: Cigarettes    Quit date: 08/31/1996  . Smokeless tobacco: Never Used  . Alcohol Use: No    Family History Family History  Problem Relation Age of Onset  . Aneurysm Mother     brain  . Heart disease Father   . Cirrhosis Father   . Heart disease Brother   . Hypertension Other   . Cancer Sister     lung    Allergies  Allergen Reactions  . Atorvastatin   . Codeine   . Demerol   . Oxycodone Hcl   . Rosuvastatin     HIGHER DOSE    Current Outpatient Prescriptions  Medication Sig Dispense Refill  . aspirin 81 MG tablet Take 162 mg by mouth daily.       . Biotin ([facility] BIOTIN HIGH POTENCY) 1000 MCG tablet Take 1,000 mcg by mouth 3 (three) times daily.      . carvedilol (COREG) 6.25 MG tablet Take 1 tablet (6.25 mg total) by mouth 2 (two) times daily with a meal.  180 tablet  4  .  hydrochlorothiazide (HYDRODIURIL) 25 MG tablet Take 25 mg by mouth daily.      . Multiple Vitamin (MULTIVITAMIN) tablet Take 1 tablet by mouth daily.      . Pediatric Multiple Vit-C-FA (FLINSTONES GUMMIES OMEGA-3 DHA) CHEW Chew by mouth daily.      . rosuvastatin (CRESTOR) 5 MG tablet Take 1 tablet (5 mg total) by mouth every other day.  28 tablet    . thiamine (VITAMIN B-1) 100 MG tablet Take 100 mg by mouth daily.      . Omega-3 Fatty Acids (FISH OIL) 1000 MG CAPS Take 1,000 mg by mouth daily.        Physical Examination  Filed Vitals:   06/14/12 1003  BP: 132/60  Pulse: 58  Resp:     Body mass index is 28.30 kg/(m^2).  General:  WDWN in NAD Gait: Normal HEENT: WNL Eyes: Pupils equal Pulmonary: normal non-labored breathing , without Rales, rhonchi,  wheezing Cardiac: RRR, without  Murmurs, rubs or gallops; Abdomen: soft, NT, no masses Skin: no rashes, ulcers noted  Vascular Exam Pulses: 3+ radial pulses bilaterally Carotid bruits:  Carotid pulses to auscultation no bruits detected Extremities without ischemic changes, no Gangrene , no cellulitis; no open wounds;  Musculoskeletal: no muscle wasting or atrophy   Neurologic: A&O X 3; Appropriate Affect ; SENSATION: normal; MOTOR FUNCTION:  moving all extremities equally. Speech is fluent/normal  Non-Invasive Vascular Imaging CAROTID DUPLEX 06/14/2012  Right ICA 20 - 39 % stenosis Left ICA 60 - 79 % stenosis   ASSESSMENT/PLAN: Asymptomatic patient status post right CEA with advanced left ICA stenosis. The patient knows the signs and symptoms of CVA and knows to report to the nearest emergency department should that occur. The patient will followup in 6 months with repeat carotid duplex. The patient's questions were encouraged and answered, she is in agreement with this plan.  Lauree Chandler ANP   Clinic MD: Early

## 2012-06-15 ENCOUNTER — Telehealth: Payer: Self-pay | Admitting: Cardiovascular Disease

## 2012-06-15 MED ORDER — ROSUVASTATIN CALCIUM 10 MG PO TABS: 10.0000 mg | ORAL_TABLET | Freq: Every day | ORAL | Status: DC

## 2012-06-15 NOTE — Telephone Encounter (Signed)
Pt states the samples of Crestor seemed to work well and would like for Dr. Mariah Milling to call in a prescription to [facility] on [address].

## 2012-06-15 NOTE — Telephone Encounter (Signed)
refill 

## 2012-06-15 NOTE — Telephone Encounter (Signed)
The patient states the crestor 5 mg is working well and needs to increase to crestor 10 mg per Dr. Windell Hummingbird instructions at last office visit. A new Rx sent for crestor 10 mg take one tablet daily.

## 2012-08-29 ENCOUNTER — Ambulatory Visit: Payer: Medicare Other | Admitting: Internal Medicine

## 2012-08-30 ENCOUNTER — Encounter: Payer: Self-pay | Admitting: Internal Medicine

## 2012-08-30 ENCOUNTER — Ambulatory Visit (INDEPENDENT_AMBULATORY_CARE_PROVIDER_SITE_OTHER): Payer: Medicare Other | Admitting: Internal Medicine

## 2012-08-30 VITALS — BP 126/82 | HR 54 | Temp 98.0°F | Resp 16 | Wt 158.5 lb

## 2012-08-30 DIAGNOSIS — M545 Low back pain, unspecified: Secondary | ICD-10-CM

## 2012-08-30 DIAGNOSIS — F411 Generalized anxiety disorder: Secondary | ICD-10-CM

## 2012-08-30 DIAGNOSIS — F419 Anxiety disorder, unspecified: Secondary | ICD-10-CM

## 2012-08-30 DIAGNOSIS — M79604 Pain in right leg: Secondary | ICD-10-CM

## 2012-08-30 DIAGNOSIS — R197 Diarrhea, unspecified: Secondary | ICD-10-CM | POA: Insufficient documentation

## 2012-08-30 MED ORDER — ALPRAZOLAM 0.5 MG PO TABS: 0.5000 mg | ORAL_TABLET | Freq: Every evening | ORAL | Status: DC | PRN

## 2012-08-30 NOTE — Assessment & Plan Note (Signed)
Pain has completely resolved without intervention. Plain film showed no acute process. Will continue to monitor.

## 2012-08-30 NOTE — Assessment & Plan Note (Signed)
Symptoms markedly improved with intermittent use of Alprazolam 0.5mg  daily prn. Will continue prn. Follow up 3 months and prn.

## 2012-08-30 NOTE — Assessment & Plan Note (Signed)
Recent symptoms 3 days of watery diarrhea after increased consumption of nuts. Symptoms seem to be resolving. Will continue to monitor. Pt will avoid nuts given h/o divericultitis in the past. She will call if any recurrent diarrhea, blood in stool or abdominal pain.

## 2012-08-30 NOTE — Progress Notes (Signed)
Subjective:    Patient ID: Catherine Green, female    DOB: [birth year], 71 y.o.   MRN: [id number]  HPI 71YO female with h/o of CAD, PVD, HTN, HL presents for follow up. At her last visit, she complained of severe back pain. X-ray of the lumbar spine was normal. She reports that back pain has since completely resolved. She is concerned today because over the last 3 days she has had some watery, nonbloody diarrhea. She also has had some intermittent diffuse abdominal cramping. She attributes this to increased consumption of nuts over the holidays. She typically has diarrhea when she eats nuts. She denies any persistent abdominal pain, blood in her stool, nausea or vomiting. She denies fever or chills.  In regards to anxiety, she reports symptoms have been very well-controlled with use of alprazolam. She denies any side effects from this medication.   Outpatient Encounter Prescriptions as of 08/30/2012  Medication Sig Dispense Refill  . aspirin 81 MG tablet Take 162 mg by mouth daily.       . Biotin ([facility] BIOTIN HIGH POTENCY) 1000 MCG tablet Take 1,000 mcg by mouth 3 (three) times daily.      . carvedilol (COREG) 6.25 MG tablet Take 1 tablet (6.25 mg total) by mouth 2 (two) times daily with a meal.  180 tablet  4  . hydrochlorothiazide (HYDRODIURIL) 25 MG tablet Take 25 mg by mouth daily.      . Multiple Vitamin (MULTIVITAMIN) tablet Take 1 tablet by mouth daily.      . Omega-3 Fatty Acids (FISH OIL) 1000 MG CAPS Take 1,000 mg by mouth daily.      . Pediatric Multiple Vit-C-FA (FLINSTONES GUMMIES OMEGA-3 DHA) Green Green by mouth daily.      . rosuvastatin (CRESTOR) 10 MG tablet Take 1 tablet (10 mg total) by mouth daily.  90 tablet  3  . thiamine (VITAMIN B-1) 100 MG tablet Take 100 mg by mouth daily.      Marland Kitchen ALPRAZolam (XANAX) 0.5 MG tablet Take 1 tablet (0.5 mg total) by mouth at bedtime as needed for sleep or anxiety.  30 tablet  4   BP 126/82  Pulse 54  Temp 98 F (36.7 C) (Oral)  Resp 16  Wt  158 lb 8 oz (71.895 kg)  SpO2 99%  Review of Systems  Constitutional: Negative for fever, chills, appetite change, fatigue and unexpected weight change.  HENT: Negative for ear pain, congestion, sore throat, trouble swallowing, neck pain, voice change and sinus pressure.   Eyes: Negative for visual disturbance.  Respiratory: Negative for cough, shortness of breath, wheezing and stridor.   Cardiovascular: Negative for chest pain, palpitations and leg swelling.  Gastrointestinal: Positive for diarrhea. Negative for nausea, vomiting, abdominal pain, constipation, blood in stool, abdominal distention and anal bleeding.  Genitourinary: Negative for dysuria and flank pain.  Musculoskeletal: Negative for myalgias, arthralgias and gait problem.  Skin: Negative for color change and rash.  Neurological: Negative for dizziness and headaches.  Hematological: Negative for adenopathy. Does not bruise/bleed easily.  Psychiatric/Behavioral: Negative for suicidal ideas, sleep disturbance and dysphoric mood. The patient is not nervous/anxious.        Objective:   Physical Exam  Constitutional: She is oriented to person, place, and time. She appears well-developed and well-nourished. No distress.  HENT:  Head: Normocephalic and atraumatic.  Right Ear: External ear normal.  Left Ear: External ear normal.  Nose: Nose normal.  Mouth/Throat: Oropharynx is clear and moist. No oropharyngeal exudate.  Eyes: Conjunctivae normal are normal. Pupils are equal, round, and reactive to light. Right eye exhibits no discharge. Left eye exhibits no discharge. No scleral icterus.  Neck: Normal range of motion. Neck supple. No tracheal deviation present. No thyromegaly present.  Cardiovascular: Normal rate, regular rhythm, normal heart sounds and intact distal pulses.  Exam reveals no gallop and no friction rub.   No murmur heard. Pulmonary/Chest: Effort normal and breath sounds normal. No respiratory distress. She has no  wheezes. She has no rales. She exhibits no tenderness.  Abdominal: Soft. Bowel sounds are normal. She exhibits no distension and no mass. There is tenderness (slight right mid abdomen). There is no rebound and no guarding.  Musculoskeletal: Normal range of motion. She exhibits no edema and no tenderness.  Lymphadenopathy:    She has no cervical adenopathy.  Neurological: She is alert and oriented to person, place, and time. No cranial nerve deficit. She exhibits normal muscle tone. Coordination normal.  Skin: Skin is warm and dry. No rash noted. She is not diaphoretic. No erythema. No pallor.  Psychiatric: She has a normal mood and affect. Her behavior is normal. Judgment and thought content normal.          Assessment & Plan:

## 2012-10-29 ENCOUNTER — Other Ambulatory Visit: Payer: Self-pay | Admitting: Cardiovascular Disease

## 2012-10-31 ENCOUNTER — Other Ambulatory Visit: Payer: Self-pay | Admitting: *Deleted

## 2012-10-31 MED ORDER — CARVEDILOL 6.25 MG PO TABS: 6.2500 mg | ORAL_TABLET | Freq: Two times a day (BID) | ORAL | Status: DC

## 2012-10-31 NOTE — Telephone Encounter (Signed)
[facility]

## 2012-11-07 ENCOUNTER — Ambulatory Visit (INDEPENDENT_AMBULATORY_CARE_PROVIDER_SITE_OTHER): Payer: Medicare Other | Admitting: Cardiovascular Disease

## 2012-11-07 ENCOUNTER — Encounter: Payer: Self-pay | Admitting: Cardiovascular Disease

## 2012-11-07 VITALS — BP 142/72 | HR 59 | Ht 62.0 in | Wt 155.5 lb

## 2012-11-07 DIAGNOSIS — I739 Peripheral vascular disease, unspecified: Secondary | ICD-10-CM

## 2012-11-07 DIAGNOSIS — R0602 Shortness of breath: Secondary | ICD-10-CM

## 2012-11-07 DIAGNOSIS — I10 Essential (primary) hypertension: Secondary | ICD-10-CM

## 2012-11-07 DIAGNOSIS — I25810 Atherosclerosis of coronary artery bypass graft(s) without angina pectoris: Secondary | ICD-10-CM

## 2012-11-07 DIAGNOSIS — E785 Hyperlipidemia, unspecified: Secondary | ICD-10-CM

## 2012-11-07 NOTE — Assessment & Plan Note (Signed)
She has followup carotid ultrasound in April in [place].

## 2012-11-07 NOTE — Assessment & Plan Note (Signed)
Currently with no symptoms of angina. No further workup at this time. Continue current medication regimen. 

## 2012-11-07 NOTE — Patient Instructions (Addendum)
You are doing well. No medication changes were made.  Keep trying to take crestor --If you have trouble tolerating crestor, call the office  Please call us if you have new issues that need to be addressed before your next appt.  Your physician wants you to follow-up in: 6 months.  You will receive a reminder letter in the mail two months in advance. If you don't receive a letter, please call our office to schedule the follow-up appointment.

## 2012-11-07 NOTE — Progress Notes (Signed)
Patient ID: Catherine Green, female    DOB: 01/05/1941, 72 y.o.   MRN: [id number]  HPI Comments: Catherine Green is a very pleasant 72 year old woman with history coronary artery disease, bypass surgery, peripheral vascular disease with history of right carotid endarterectomy followed by Dr. Arbie Cookey, who presents for routine followup. Last catheterization in January 2009 showing patent vein grafts, atretic LIMA to the LAD.    Carotid ultrasound shows 50-70% disease on the left.  She continues to have a to go time tolerating Crestor 5 mg daily. In the past, it caused stomach problems. She continues to have stomach problems and now with hair loss. She previously had hair loss on simvastatin, as well as sores on her head. Lipitor caused leg problems/myalgias.  She is not particularly interested in fenofibrate, WelChol. Too many pills. He She has had problems with zetia.   She denies any significant shortness of breath or chest pain. No other complaints.    EKG shows normal sinus rhythm with rate of 59 beats per minute, no significant ST or T wave changes.        Outpatient Encounter Prescriptions as of 11/07/2012  Medication Sig Dispense Refill  . ALPRAZolam (XANAX) 0.5 MG tablet Take 1 tablet (0.5 mg total) by mouth at bedtime as needed for sleep or anxiety.  30 tablet  4  . aspirin 81 MG tablet Take 162 mg by mouth daily.       . Biotin ([facility] BIOTIN HIGH POTENCY) 1000 MCG tablet Take 10,000 mcg by mouth daily.       . carvedilol (COREG) 6.25 MG tablet Take 1 tablet (6.25 mg total) by mouth 2 (two) times daily with a meal.  120 tablet  3  . hydrochlorothiazide (HYDRODIURIL) 25 MG tablet Take 25 mg by mouth daily.      . Multiple Vitamin (MULTIVITAMIN) tablet Take 1 tablet by mouth daily.      . Pediatric Multiple Vit-C-FA (FLINSTONES GUMMIES OMEGA-3 DHA) CHEW Chew by mouth daily.      . rosuvastatin (CRESTOR) 10 MG tablet Take 5 mg by mouth daily.      . [DISCONTINUED] rosuvastatin (CRESTOR) 10  MG tablet Take 1 tablet (10 mg total) by mouth daily.  90 tablet  3  . [DISCONTINUED] Omega-3 Fatty Acids (FISH OIL) 1000 MG CAPS Take 1,000 mg by mouth daily.      . [DISCONTINUED] thiamine (VITAMIN B-1) 100 MG tablet Take 100 mg by mouth daily.       No facility-administered encounter medications on file as of 11/07/2012.      Review of Systems  Constitutional: Negative.   HENT: Negative.   Eyes: Negative.   Respiratory: Negative.   Cardiovascular: Negative.   Gastrointestinal: Negative.   Musculoskeletal: Negative.   Skin: Negative.   Neurological: Negative.   Psychiatric/Behavioral: Negative.   All other systems reviewed and are negative.    BP 142/72  Pulse 59  Ht 5\' 2"  (1.575 m)  Wt 155 lb 8 oz (70.534 kg)  BMI 28.43 kg/m2  Physical Exam  Nursing note and vitals reviewed. Constitutional: She is oriented to person, place, and time. She appears well-developed and well-nourished.  HENT:  Head: Normocephalic.  Nose: Nose normal.  Mouth/Throat: Oropharynx is clear and moist.  Eyes: Conjunctivae are normal. Pupils are equal, round, and reactive to light.  Neck: Normal range of motion. Neck supple. No JVD present.  Cardiovascular: Normal rate, regular rhythm, S1 normal, S2 normal, normal heart sounds and intact distal  pulses.  Exam reveals no gallop and no friction rub.   No murmur heard. Pulmonary/Chest: Effort normal and breath sounds normal. No respiratory distress. She has no wheezes. She has no rales. She exhibits no tenderness.  Abdominal: Soft. Bowel sounds are normal. She exhibits no distension. There is no tenderness.  Musculoskeletal: Normal range of motion. She exhibits no edema and no tenderness.  Lymphadenopathy:    She has no cervical adenopathy.  Neurological: She is alert and oriented to person, place, and time. Coordination normal.  Skin: Skin is warm and dry. No rash noted. No erythema.  Psychiatric: She has a normal mood and affect. Her behavior is  normal. Judgment and thought content normal.    Assessment and Plan

## 2012-11-07 NOTE — Assessment & Plan Note (Signed)
Blood pressure is well controlled on today's visit. No changes made to the medications. 

## 2012-11-07 NOTE — Assessment & Plan Note (Signed)
She'll continue to take Crestor 5 mg daily if tolerated. We've asked her to call our office for worsening side effects. Other option includes an alternate statin, livalo, or other options such as WelChol. She's not particularly interested in taking numerous pills at a time.

## 2012-12-13 ENCOUNTER — Other Ambulatory Visit (INDEPENDENT_AMBULATORY_CARE_PROVIDER_SITE_OTHER): Payer: Medicare Other | Admitting: Vascular Surgery

## 2012-12-13 ENCOUNTER — Ambulatory Visit: Payer: Medicare Other | Admitting: Neurosurgery

## 2012-12-13 ENCOUNTER — Other Ambulatory Visit: Payer: Self-pay

## 2012-12-13 DIAGNOSIS — I6529 Occlusion and stenosis of unspecified carotid artery: Secondary | ICD-10-CM

## 2012-12-13 DIAGNOSIS — Z48812 Encounter for surgical aftercare following surgery on the circulatory system: Secondary | ICD-10-CM

## 2012-12-14 ENCOUNTER — Encounter: Payer: Self-pay | Admitting: Vascular Surgery

## 2013-03-13 ENCOUNTER — Other Ambulatory Visit: Payer: Self-pay | Admitting: Adult Health

## 2013-03-13 ENCOUNTER — Ambulatory Visit (INDEPENDENT_AMBULATORY_CARE_PROVIDER_SITE_OTHER): Payer: Medicare Other | Admitting: Adult Health

## 2013-03-13 ENCOUNTER — Encounter: Payer: Self-pay | Admitting: Adult Health

## 2013-03-13 ENCOUNTER — Ambulatory Visit: Payer: Self-pay | Admitting: Adult Health

## 2013-03-13 VITALS — BP 148/70 | HR 63 | Temp 98.1°F | Resp 12 | Wt 156.5 lb

## 2013-03-13 DIAGNOSIS — R1011 Right upper quadrant pain: Secondary | ICD-10-CM

## 2013-03-13 DIAGNOSIS — G8929 Other chronic pain: Secondary | ICD-10-CM

## 2013-03-13 NOTE — Assessment & Plan Note (Signed)
?  Gallbladder. Ordered CT of abdomen however patient needs to be premedicated 24 hours prior. Will have ultrasound of abdomen today.

## 2013-03-13 NOTE — Progress Notes (Signed)
  Subjective:    Patient ID: Catherine Green, female    DOB: 11-17-1940, 72 y.o.   MRN: [id number]  HPI  Patient is a pleasant 72 y/o female who presents to clinic with severe left upper and lower quadrant pain that radiates to her back and some to the epigastric area. She reports the pain began this past Thursday. Pain was so severe that she thought she was going to have to go to the ED except that she did not want to wait there for hours. She slept and felt the pain improve. She has not eaten much since Thursday. This morning she had some dry toast. She is not certain if she had any fever. She reports having bm.   Current Outpatient Prescriptions on File Prior to Visit  Medication Sig Dispense Refill  . aspirin 81 MG tablet Take 162 mg by mouth daily.       . carvedilol (COREG) 6.25 MG tablet Take 1 tablet (6.25 mg total) by mouth 2 (two) times daily with a meal.  120 tablet  3  . hydrochlorothiazide (HYDRODIURIL) 25 MG tablet Take 25 mg by mouth daily.      . Multiple Vitamin (MULTIVITAMIN) tablet Take 1 tablet by mouth daily.      . Pediatric Multiple Vit-C-FA (FLINSTONES GUMMIES OMEGA-3 DHA) Green Green by mouth daily.      . rosuvastatin (CRESTOR) 10 MG tablet Take 5 mg by mouth daily.      Marland Kitchen ALPRAZolam (XANAX) 0.5 MG tablet Take 1 tablet (0.5 mg total) by mouth at bedtime as needed for sleep or anxiety.  30 tablet  4   No current facility-administered medications on file prior to visit.     Review of Systems  Constitutional: Positive for appetite change and fatigue.  Respiratory: Negative.   Cardiovascular: Negative.   Gastrointestinal: Positive for nausea, abdominal pain and diarrhea.   BP 148/70  Pulse 63  Temp(Src) 98.1 F (36.7 C) (Oral)  Resp 12  Wt 156 lb 8 oz (70.988 kg)  BMI 28.62 kg/m2  SpO2 96%    Objective:   Physical Exam  Constitutional: She is oriented to person, place, and time.  Patient appears very uncomfortable. Movements are very guarded.   Cardiovascular: Normal rate, regular rhythm and normal heart sounds.   Pulmonary/Chest: Effort normal and breath sounds normal.  Abdominal: Bowel sounds are normal. There is tenderness.  Abdomen is extremely painful with light palpation. Did not attempt deep palpation.  Neurological: She is alert and oriented to person, place, and time.  Skin: Skin is warm and dry.  Psychiatric: She has a normal mood and affect. Her behavior is normal. Judgment and thought content normal.       Assessment & Plan:

## 2013-03-16 ENCOUNTER — Telehealth: Payer: Self-pay | Admitting: *Deleted

## 2013-03-16 ENCOUNTER — Ambulatory Visit: Payer: Self-pay | Admitting: Adult Health

## 2013-03-16 NOTE — Telephone Encounter (Signed)
CT abdomen/pelvis showed no acute problems. Called and advised patient of results. Pt states she is improving daily, has had no nausea or vomiting today. Pain is improved, not there all the time. Pt is tolerating bland foods. Advised to notify office with persistence of symptoms or worsening of symptoms.

## 2013-03-23 ENCOUNTER — Encounter: Payer: Self-pay | Admitting: Adult Health

## 2013-03-24 ENCOUNTER — Encounter: Payer: Self-pay | Admitting: Adult Health

## 2013-03-30 ENCOUNTER — Telehealth: Payer: Self-pay | Admitting: *Deleted

## 2013-03-30 NOTE — Telephone Encounter (Signed)
Spoke with Catherine Green states after she takes Coreg 6.25mg  she gets really weak and then she gets out of breath with ADL's.  Catherine Green states her BP has been running in the 150/60-70,  And HR in the 70's.  Catherine Green states she just wants Dr Mariah Milling to take her off Coreg and switch her to something else. Please advise.

## 2013-03-30 NOTE — Telephone Encounter (Signed)
When taking bp medicine (Coreg 6.25mg  x2 day)  she feels sob and weak, please advise

## 2013-04-03 MED ORDER — CARVEDILOL 6.25 MG PO TABS: ORAL_TABLET | ORAL | Status: DC

## 2013-04-03 NOTE — Telephone Encounter (Signed)
Called patient with MD recommendations. She will cut back Coreg to one half of a 6.25 mg tab 2 times per day.

## 2013-04-03 NOTE — Telephone Encounter (Signed)
Would not recommend stopping Coreg Heart rate and blood pressure look fine If she needs to, we could cut the dose in half twice a day Data  on a beta blocker is very strong, less heart attack and cardiac complications if she is on a  beta blocker

## 2013-04-05 ENCOUNTER — Encounter: Payer: Self-pay | Admitting: Adult Health

## 2013-04-05 ENCOUNTER — Ambulatory Visit (INDEPENDENT_AMBULATORY_CARE_PROVIDER_SITE_OTHER): Payer: Medicare Other | Admitting: Adult Health

## 2013-04-05 VITALS — BP 138/62 | HR 58 | Temp 97.9°F | Resp 12 | Wt 158.0 lb

## 2013-04-05 DIAGNOSIS — L509 Urticaria, unspecified: Secondary | ICD-10-CM

## 2013-04-05 MED ORDER — CLOBETASOL PROPIONATE 0.05 % EX FOAM: Freq: Two times a day (BID) | CUTANEOUS | Status: DC

## 2013-04-05 NOTE — Patient Instructions (Addendum)
  Start clobetasol foam twice a day to your scalp.  Try Neutrogena Shampoo  Avoid any harsh chemicals or shampoos.

## 2013-04-05 NOTE — Assessment & Plan Note (Addendum)
Extremely itchy scalp. She has not changed any products. Start clobetasol foam. Offered referral to dermatology but patient declined.

## 2013-04-05 NOTE — Progress Notes (Signed)
  Subjective:    Patient ID: Catherine Green, female    DOB: 03/24/1941, 72 y.o.   MRN: [id number]  HPI  Patient is a pleasant 72 year old female who presents to clinic with complaint of having an itchy scalp. She denies using any new products. She has not started any new medication prescribed or over-the-counter. She routinely colors her hair with chemical dyes however she reports not changing anything she has done in the past. She has not been using any new shampoos or conditioners. She has not changed any of her hair products. Her hair is extremely dry as well as her scalp. She denies any flaking of her scalp. She has no other area that is irritated.   Current Outpatient Prescriptions on File Prior to Visit  Medication Sig Dispense Refill  . ALPRAZolam (XANAX) 0.5 MG tablet Take 1 tablet (0.5 mg total) by mouth at bedtime as needed for sleep or anxiety.  30 tablet  4  . aspirin 81 MG tablet Take 162 mg by mouth daily.       . carvedilol (COREG) 6.25 MG tablet One half tab 2 times per day      . cyanocobalamin 500 MCG tablet Take 500 mcg by mouth daily.      . hydrochlorothiazide (HYDRODIURIL) 25 MG tablet Take 25 mg by mouth daily.      . Multiple Vitamin (MULTIVITAMIN) tablet Take 1 tablet by mouth daily.      . Pediatric Multiple Vit-C-FA (FLINSTONES GUMMIES OMEGA-3 DHA) Green Green by mouth daily.      . rosuvastatin (CRESTOR) 10 MG tablet Take 5 mg by mouth daily.       No current facility-administered medications on file prior to visit.     Review of Systems  Skin: Negative for pallor and rash.       Irritated and itchy scalp    BP 138/62  Pulse 58  Temp(Src) 97.9 F (36.6 C) (Oral)  Resp 12  Wt 158 lb (71.668 kg)  BMI 28.89 kg/m2  SpO2 97%    Objective:   Physical Exam  Constitutional: She is oriented to person, place, and time. She appears well-developed and well-nourished.  Cardiovascular: Normal rate and regular rhythm.   Pulmonary/Chest: Effort normal. No respiratory  distress.  Neurological: She is alert and oriented to person, place, and time.  Skin:  Scalp is dry, erythematous without any flaking noted. There are no lesions. No areas of alopecia.  Psychiatric: She has a normal mood and affect. Her behavior is normal. Judgment and thought content normal.          Assessment & Plan:

## 2013-04-07 ENCOUNTER — Telehealth: Payer: Self-pay | Admitting: Internal Medicine

## 2013-04-07 NOTE — Telephone Encounter (Signed)
Have you seen anything about a prior authorization on this patient for the foam Raquel gave her for her scalp?

## 2013-04-07 NOTE — Telephone Encounter (Signed)
I have not seen this? Have you seen this Becky?

## 2013-04-11 ENCOUNTER — Other Ambulatory Visit: Payer: Self-pay | Admitting: Internal Medicine

## 2013-04-11 NOTE — Telephone Encounter (Signed)
I have approved refill, however she needs a follow up visit as well.

## 2013-04-25 ENCOUNTER — Other Ambulatory Visit: Payer: Self-pay | Admitting: Cardiovascular Disease

## 2013-05-08 ENCOUNTER — Encounter: Payer: Self-pay | Admitting: Cardiovascular Disease

## 2013-05-08 ENCOUNTER — Ambulatory Visit (INDEPENDENT_AMBULATORY_CARE_PROVIDER_SITE_OTHER): Payer: Medicare Other | Admitting: Cardiovascular Disease

## 2013-05-08 VITALS — BP 138/70 | HR 62 | Ht 62.0 in | Wt 158.5 lb

## 2013-05-08 DIAGNOSIS — I10 Essential (primary) hypertension: Secondary | ICD-10-CM

## 2013-05-08 DIAGNOSIS — E785 Hyperlipidemia, unspecified: Secondary | ICD-10-CM

## 2013-05-08 DIAGNOSIS — E876 Hypokalemia: Secondary | ICD-10-CM

## 2013-05-08 DIAGNOSIS — I739 Peripheral vascular disease, unspecified: Secondary | ICD-10-CM

## 2013-05-08 DIAGNOSIS — R0789 Other chest pain: Secondary | ICD-10-CM

## 2013-05-08 DIAGNOSIS — I25810 Atherosclerosis of coronary artery bypass graft(s) without angina pectoris: Secondary | ICD-10-CM

## 2013-05-08 NOTE — Assessment & Plan Note (Signed)
No recent lipid panel on her low-dose Crestor. We have placed an order for lipids.

## 2013-05-08 NOTE — Assessment & Plan Note (Signed)
Blood pressure is well controlled on today's visit. No changes made to the medications. 

## 2013-05-08 NOTE — Assessment & Plan Note (Signed)
Currently with no symptoms of angina. No further workup at this time. Continue current medication regimen. 

## 2013-05-08 NOTE — Patient Instructions (Addendum)
You are doing well. No medication changes were made.  We will check a potassium level today  Please call us if you have new issues that need to be addressed before your next appt.  Your physician wants you to follow-up in: 6 months.  You will receive a reminder letter in the mail two months in advance. If you don't receive a letter, please call our office to schedule the follow-up appointment.

## 2013-05-08 NOTE — Assessment & Plan Note (Signed)
At least moderate  carotid arterial disease, encouraged her to take as much Crestor as tolerated.

## 2013-05-08 NOTE — Progress Notes (Signed)
Patient ID: Catherine Green, female    DOB: [birth year], 72 y.o.   MRN: [id number]  HPI Comments: [name] is a very pleasant 72 year old woman with history coronary artery disease, bypass surgery, peripheral vascular disease with history of right carotid endarterectomy followed by Dr. Arbie Cookey, who presents for routine followup. Last catheterization in January 2009 showing patent vein grafts, atretic LIMA to the LAD.    Carotid ultrasound shows 50-70% disease on the left.  Overall she is doing well. She is tolerating Crestor 5 mg daily.   She continues to have stomach problems and hair loss. She previously had "hair loss" on simvastatin, as well as sores on her head. Lipitor caused leg problems/myalgias.  She is not particularly interested in fenofibrate, WelChol. Too many pills. He She has had problems with zetia.  She feels today that the carvedilol is making her hair fall out. She has cut the pill in half. Stressful. Recently, helping to take care of her sister who has medical issues  She denies any significant shortness of breath or chest pain. No other complaints.    EKG shows normal sinus rhythm with rate of 62 beats per minute, nonspecific ST abnormality in leads 2, 3, aVF        Outpatient Encounter Prescriptions as of 05/08/2013  Medication Sig Dispense Refill  . ALPRAZolam (XANAX) 0.5 MG tablet TAKE 1 TABLET BY MOUTH AT BEDTIME AS NEEDED FOR SLEEP OR ANXIETY  30 tablet  1  . aspirin 81 MG tablet Take 162 mg by mouth daily.       . carvedilol (COREG) 6.25 MG tablet One half tab 2 times per day      . cyanocobalamin 500 MCG tablet Take 500 mcg by mouth daily.      . hydrochlorothiazide (HYDRODIURIL) 50 MG tablet TAKE 1 TABLET (50 MG TOTAL) BY MOUTH DAILY.  90 tablet  3  . Multiple Vitamin (MULTIVITAMIN) tablet Take 1 tablet by mouth daily.      . rosuvastatin (CRESTOR) 10 MG tablet Take 5 mg by mouth daily.      . [DISCONTINUED] clobetasol (OLUX) 0.05 % topical foam Apply  topically 2 (two) times daily.  50 g  0  . [DISCONTINUED] hydrochlorothiazide (HYDRODIURIL) 25 MG tablet Take 25 mg by mouth daily.      . [DISCONTINUED] Pediatric Multiple Vit-C-FA (FLINSTONES GUMMIES OMEGA-3 DHA) CHEW Chew by mouth daily.        Review of Systems  Constitutional: Negative.   HENT: Negative.        Hair falling out  Eyes: Negative.   Respiratory: Negative.   Cardiovascular: Negative.   Gastrointestinal: Negative.   Musculoskeletal: Negative.   Skin: Negative.   Neurological: Negative.   Psychiatric/Behavioral: Negative.   All other systems reviewed and are negative.    BP 138/70  Pulse 62  Ht 5\' 2"  (1.575 m)  Wt 158 lb 8 oz (71.895 kg)  BMI 28.98 kg/m2  Physical Exam  Nursing note and vitals reviewed. Constitutional: She is oriented to person, place, and time. She appears well-developed and well-nourished.  HENT:  Head: Normocephalic.  Nose: Nose normal.  Mouth/Throat: Oropharynx is clear and moist.  Eyes: Conjunctivae are normal. Pupils are equal, round, and reactive to light.  Neck: Normal range of motion. Neck supple. No JVD present.  Cardiovascular: Normal rate, regular rhythm, S1 normal, S2 normal, normal heart sounds and intact distal pulses.  Exam reveals no gallop and no friction rub.   No murmur heard.  Pulmonary/Chest: Effort normal and breath sounds normal. No respiratory distress. She has no wheezes. She has no rales. She exhibits no tenderness.  Abdominal: Soft. Bowel sounds are normal. She exhibits no distension. There is no tenderness.  Musculoskeletal: Normal range of motion. She exhibits no edema and no tenderness.  Lymphadenopathy:    She has no cervical adenopathy.  Neurological: She is alert and oriented to person, place, and time. Coordination normal.  Skin: Skin is warm and dry. No rash noted. No erythema.  Psychiatric: She has a normal mood and affect. Her behavior is normal. Judgment and thought content normal.    Assessment and  Plan

## 2013-05-09 LAB — BASIC METABOLIC PANEL
BUN/Creatinine Ratio: 16 (ref 11–26)
BUN: 14 mg/dL (ref 8–27)
CO2: 30 mmol/L — ABNORMAL HIGH (ref 18–29)
Calcium: 10.1 mg/dL (ref 8.6–10.2)
Chloride: 91 mmol/L — ABNORMAL LOW (ref 97–108)
Creatinine, Ser: 0.86 mg/dL (ref 0.57–1.00)
GFR calc Af Amer: 78 mL/min/{1.73_m2} (ref >59)
GFR calc non Af Amer: 67 mL/min/{1.73_m2} (ref >59)
Glucose: 110 mg/dL — ABNORMAL HIGH (ref 65–99)
Potassium: 3.4 mmol/L — ABNORMAL LOW (ref 3.5–5.2)
Sodium: 138 mmol/L (ref 134–144)

## 2013-05-19 ENCOUNTER — Telehealth: Payer: Self-pay

## 2013-05-19 DIAGNOSIS — E876 Hypokalemia: Secondary | ICD-10-CM

## 2013-05-19 MED ORDER — POTASSIUM CHLORIDE ER 10 MEQ PO TBCR: 10.0000 meq | EXTENDED_RELEASE_TABLET | ORAL | Status: DC

## 2013-05-19 NOTE — Telephone Encounter (Signed)
Message copied by Marilynne Halsted on Fri May 19, 2013  4:32 PM ------      Message from: Antonieta Iba      Created: Thu May 18, 2013 10:32 PM       Potassium low, likely from HCTZ      Would start potassium 10 meq every other day, increase potassium intake , bananas daily/citrus ------

## 2013-05-19 NOTE — Telephone Encounter (Signed)
Spoke w/ pt.  She is aware of results. Verified pharmacy as [facility] [address] before sending rx for potassium 10 meq.

## 2013-06-20 ENCOUNTER — Other Ambulatory Visit: Payer: Medicare Other

## 2013-06-20 ENCOUNTER — Ambulatory Visit: Payer: Medicare Other | Admitting: Vascular Surgery

## 2013-06-21 ENCOUNTER — Other Ambulatory Visit: Payer: Self-pay | Admitting: *Deleted

## 2013-06-21 ENCOUNTER — Other Ambulatory Visit: Payer: Self-pay | Admitting: Cardiovascular Disease

## 2013-06-21 MED ORDER — CARVEDILOL 6.25 MG PO TABS: 6.2500 mg | ORAL_TABLET | Freq: Two times a day (BID) | ORAL | Status: DC

## 2013-06-21 NOTE — Telephone Encounter (Signed)
Requested Prescriptions   Signed Prescriptions Disp Refills  . carvedilol (COREG) 6.25 MG tablet 180 tablet 3    Sig: Take 1 tablet (6.25 mg total) by mouth 2 (two) times daily with a meal.    Authorizing Provider: GOLLAN, TIMOTHY J    Ordering User: LOPEZ, MARINA C    

## 2013-06-26 ENCOUNTER — Encounter: Payer: Self-pay | Admitting: Family

## 2013-06-27 ENCOUNTER — Ambulatory Visit (HOSPITAL_COMMUNITY)
Admission: RE | Admit: 2013-06-27 | Discharge: 2013-06-27 | Disposition: A | Discharge status: Home / Self Care | Payer: Medicare Other | Source: Ambulatory Visit | Attending: Family | Admitting: Family

## 2013-06-27 ENCOUNTER — Encounter: Payer: Self-pay | Admitting: Family

## 2013-06-27 ENCOUNTER — Ambulatory Visit (INDEPENDENT_AMBULATORY_CARE_PROVIDER_SITE_OTHER): Payer: Medicare Other | Admitting: Family

## 2013-06-27 DIAGNOSIS — I6529 Occlusion and stenosis of unspecified carotid artery: Secondary | ICD-10-CM | POA: Insufficient documentation

## 2013-06-27 DIAGNOSIS — Z48812 Encounter for surgical aftercare following surgery on the circulatory system: Secondary | ICD-10-CM | POA: Insufficient documentation

## 2013-06-27 NOTE — Progress Notes (Signed)
Established Carotid Patient  History of Present Illness  Catherine Green is a 72 y.o. female patient of Dr. Arbie Cookey status post right CEA in 1992 with a revision in 06. States her cholesterol is high despite taking Crestor, not having diabetes that she is aware, being physically active, not smoking. Exercises 30 minutes daily. Had 3 vessel CABG in 2004.  Patient report history of TIA or stroke symptom in 1991 as manifested by garbled speech and amaurosis fugax or monocular blindness as manifested by pages of print appearing blank.  The patient  denies facial drooping.  Pt. denies hemiplegia.     Patient denies New Medical or Surgical History.  Pt Diabetic: No Pt smoker: former smoker, quit early 1990's  Pt meds include: Statin : Yes Betablocker: Yes ASA: Yes Other anticoagulants/antiplatelets: no   Past Medical History  Diagnosis Date  . Hyperlipidemia   . Coronary artery disease   . Hypertension   . PVD (peripheral vascular disease)     endarterectomy by Dr. Arbie Cookey  . Pre-syncope   . Thrombophlebitis     following childbirth  . Cerebrovascular disease     extracranial; occlusive  . Ulcer   . Heart murmur   . Dizziness   . Chicken pox   . Depression   . Fainting spell   . Allergy     hay fever  . Heart murmur     Social History History  Substance Use Topics  . Smoking status: Former Smoker -- 0.30 packs/day for 15 years    Types: Cigarettes    Quit date: 08/31/1996  . Smokeless tobacco: Never Used  . Alcohol Use: No    Family History Family History  Problem Relation Age of Onset  . Aneurysm Mother     brain  . Heart disease Father   . Cirrhosis Father   . Heart disease Brother   . Hypertension Other   . Cancer Sister     lung    Surgical History Past Surgical History  Procedure Laterality Date  . Endarterectomy  2006    Dr. Arbie Cookey  . Septoplasty      with bilateral inferior turbinate reductions  . Total abdominal hysterectomy    . Appendectomy     . Carotid endarterectomy  1992  . Eye surgery  Feb. 2013    Cataract Left eye  . Coronary artery bypass graft  2006    x3 Dr. Donata Clay  . Breast surgery      saline implants  . Cataract extraction, bilateral  2013    Allergies  Allergen Reactions  . Atorvastatin   . Codeine   . Demerol   . Oxycodone Hcl   . Rosuvastatin     HIGHER DOSE    Current Outpatient Prescriptions  Medication Sig Dispense Refill  . aspirin 81 MG tablet Take 162 mg by mouth daily.       . carvedilol (COREG) 6.25 MG tablet TAKE 1 TABLET BY MOUTH TWICE A DAY WITH FOOD  120 tablet  3  . hydrochlorothiazide (HYDRODIURIL) 50 MG tablet TAKE 1 TABLET (50 MG TOTAL) BY MOUTH DAILY.  90 tablet  3  . Multiple Vitamin (MULTIVITAMIN) tablet Take 1 tablet by mouth daily.      . potassium chloride (K-DUR) 10 MEQ tablet Take 1 tablet (10 mEq total) by mouth every other day.  30 tablet  3  . rosuvastatin (CRESTOR) 10 MG tablet Take 5 mg by mouth daily.      Marland Kitchen ALPRAZolam (  XANAX) 0.5 MG tablet TAKE 1 TABLET BY MOUTH AT BEDTIME AS NEEDED FOR SLEEP OR ANXIETY  30 tablet  1  . cyanocobalamin 500 MCG tablet Take 500 mcg by mouth daily.       No current facility-administered medications for this visit.    Review of Systems : [x]  Positive   [ ]  Denies  General:[ ]  Weight loss,  [ ]  Weight gain, [ ]  Loss of appetite, [ ]  Fever, [ ]  chills  Neurologic: [ ]  Dizziness, [ ]  Blackouts, [ ]  Headaches, [ ]  Seizure [ ]  Stroke, [ ]  "Mini stroke", [ ]  Slurred speech, [ ]  Temporary blindness;  [ ] weakness,  Ear/Nose/Throat: [ ]  Change in hearing, [ ]  Nose bleeds, [ ]  Hoarseness  Vascular:[ ]  Pain in legs with walking, [ ]  Pain in feet while lying flat , [ ]   Non-healing ulcer, [ ]  Blood clot in vein,    Pulmonary: [ ]  Home oxygen, [ ]   Productive cough, [ ]  Bronchitis, [ ]  Coughing up blood,  [ ]  Asthma, [ ]  Wheezing  Musculoskeletal:  [ ]  Arthritis, [ ]  Joint pain, [ ]  low back pain  Cardiac: [ ]  Chest pain, [ ]  Shortness of  breath when lying flat, [ ]  Shortness of breath with exertion, [ ]  Palpitations, [ ]  Heart murmur, [ ]   Atrial fibrillation  Hematologic:[ ]  Easy Bruising, [ ]  Anemia; [ ]  Hepatitis  Psychiatric: [ ]   Depression, [ ]  Anxiety   Gastrointestinal: [ ]  Black stool, [ ]  Blood in stool, [ ]  Peptic ulcer disease,  [ ]  Gastroesophageal Reflux, [ ]  Trouble swallowing, [ ]  Diarrhea, [ ]  Constipation  Urinary: [ ]  chronic Kidney disease, [ ]  on HD, [ ]  Burning with urination, [ ]  Frequent urination, [ ]  Difficulty urinating;   Skin: [ ]  Rashes, [ ]  Wounds    Physical Examination  Filed Vitals:   06/27/13 1441  BP: 150/60  Pulse: 62  Resp:    Filed Weights   06/27/13 1435  Weight: 161 lb (73.029 kg)   Body mass index is 29.44 kg/(m^2).   General: WDWN female in NAD GAIT: normal Eyes: PERRLA Pulmonary:  CTAB, Negative  Rales, Negative rhonchi, & Negative wheezing.  Cardiac: regular Rhythm ,  positive Murmur.  VASCULAR EXAM Carotid Bruits Left Right   Transmitted cardiac mumur Transmitted cardiac murmur    Aorta is not palpable. Radial pulses are 2+ palpable and equal.                                                                                                                            LE Pulses LEFT RIGHT       POPLITEAL  not palpable   not palpable       POSTERIOR TIBIAL   palpable    palpable        DORSALIS PEDIS      ANTERIOR TIBIAL  palpable   palpable  Gastrointestinal: soft, nontender, BS WNL, no r/g,  negative masses.  Musculoskeletal: Negative muscle atrophy/wasting. M/S 5/5 throughout, Extremities without ischemic changes.  Neurologic: A&O X 3; Appropriate Affect ; SENSATION ;normal;  Speech is normal CN 2-12 intact, Pain and light touch intact in extremities, Motor exam as listed above.   Non-Invasive Vascular Imaging CAROTID DUPLEX 06/27/2013   Right ICA: (CEA site) with evidence of moderate hyperplasia in the mid endarterectomy site. Left ICA:  60 - 79 % stenosis.  These findings are Unchanged from previous exam.  Assessment: Catherine Green is a 72 y.o. female, status post right CEA in 1992 with a revision in 2006 who is asymptomatic, has evidence of moderate hyperplasia in the mid endarterectomy site, and 60-79% stenosis in the left ICA. The  ICA stenosis is  Unchanged from previous exam. Her risk factors are under control or being addressed. She states her cholesterol remains elevated despite medication adjustments, regular exercise, not smoking, not diabetic, thinks her blood pressure has been good. She does have a pervasive family history of stroke and CAD.   Plan: Follow-up in 6 months with Carotid Duplex scan.   I discussed in depth with the patient the nature of atherosclerosis, and emphasized the importance of maximal medical management including strict control of blood pressure, blood glucose, and lipid levels, obtaining regular exercise, and continued cessation of smoking.  The patient is aware that without maximal medical management the underlying atherosclerotic disease process will progress, limiting the benefit of any interventions. The patient was given information about stroke prevention and what symptoms should prompt the patient to seek immediate medical care. Thank you for allowing [place] to participate in this patient's care.  Charisse March, RN, MSN, FNP-C [facility] Office: [phone]  Clinic Physician: Early  06/27/2013 3:03 PM

## 2013-06-27 NOTE — Patient Instructions (Addendum)
Stroke Prevention Some medical conditions and behaviors are associated with an increased chance of having a stroke. You may prevent a stroke by making healthy choices and managing medical conditions. Reduce your risk of having a stroke by:  Staying physically active. Get at least 30 minutes of activity on most or all days.  Not smoking. It may also be helpful to avoid exposure to secondhand smoke.  Limiting alcohol use. Moderate alcohol use is considered to be:  No more than 2 drinks per day for men.  No more than 1 drink per day for nonpregnant women.  Eating healthy foods.  Include 5 or more servings of fruits and vegetables a day.  Certain diets may be prescribed to address high blood pressure, high cholesterol, diabetes, or obesity.  Managing your cholesterol levels.  A low-saturated fat, low-trans fat, low-cholesterol, and high-fiber diet may control cholesterol levels.  Take any prescribed medicines to control cholesterol as directed by your caregiver.  Managing your diabetes.  A controlled-carbohydrate, controlled-sugar diet is recommended to manage diabetes.  Take any prescribed medicines to control diabetes as directed by your caregiver.  Controlling your high blood pressure (hypertension).  A low-salt (sodium), low-saturated fat, low-trans fat, and low-cholesterol diet is recommended to manage high blood pressure.  Take any prescribed medicines to control hypertension as directed by your caregiver.  Maintaining a healthy weight.  A reduced-calorie, low-sodium, low-saturated fat, low-trans fat, low-cholesterol diet is recommended to manage weight.  Stopping drug abuse.  Avoiding birth control pills.  Talk to your caregiver about the risks of taking birth control pills if you are over 19 years old, smoke, get migraines, or have ever had a blood clot.  Getting evaluated for sleep disorders (sleep apnea).  Talk to your caregiver about getting a sleep evaluation  if you snore a lot or have excessive sleepiness.  Taking medicines as directed by your caregiver.  For some people, aspirin or blood thinners (anticoagulants) are helpful in reducing the risk of forming abnormal blood clots that can lead to stroke. If you have the irregular heart rhythm of atrial fibrillation, you should be on a blood thinner unless there is a good reason you cannot take them.  Understand all your medicine instructions. SEEK IMMEDIATE MEDICAL CARE IF:   You have sudden weakness or numbness of the face, arm, or leg, especially on one side of the body.  You have sudden confusion.  You have trouble speaking (aphasia) or understanding.  You have sudden trouble seeing in one or both eyes.  You have sudden trouble walking.  You have dizziness.  You have a loss of balance or coordination.  You have a sudden, severe headache with no known cause.  You have new chest pain or an irregular heartbeat. Any of these symptoms may represent a serious problem that is an emergency. Do not wait to see if the symptoms will go away. Get medical help right away. Call your local emergency services (911 in [place] Do not drive yourself to the hospital. Document Released: 09/24/2004 Document Revised: 11/09/2011 Document Reviewed: 04/06/2011 [facility] Patient Information 2014 [place], [place].   Cardiac Diet This diet can help prevent heart disease and stroke. Many factors influence your heart health, including eating and exercise habits. Coronary risk rises a lot with abnormal blood fat (lipid) levels. Cardiac meal planning includes limiting unhealthy fats, increasing healthy fats, and making other small dietary changes. General guidelines are as follows:  Adjust calorie intake to reach and maintain desirable body weight.  Limit  total fat intake to less than 30% of total calories. Saturated fat should be less than 7% of calories.  Saturated fats are found in animal products and in some  vegetable products. Saturated vegetable fats are found in coconut oil, cocoa butter, palm oil, and palm kernel oil. Read labels carefully to avoid these products as much as possible. Use butter in moderation. Choose tub margarines and oils that have 2 grams of fat or less. Good cooking oils are canola and olive oils.  Practice low-fat cooking techniques. Do not fry food. Instead, broil, bake, boil, steam, grill, roast on a rack, stir-fry, or microwave it. Other fat reducing suggestions include:  Remove the skin from poultry.  Remove all visible fat from meats.  Skim the fat off stews, soups, and gravies before serving them.  Steam vegetables in water or broth instead of sauting them in fat.  Avoid foods with trans fat (or hydrogenated oils), such as commercially fried foods and commercially baked goods. Commercial shortening and deep-frying fats will contain trans fat.  Increase intake of fruits, vegetables, whole grains, and legumes to replace foods high in fat.  Increase consumption of nuts, legumes, and seeds to at least 4 servings weekly. One serving of a legume equals  cup, and 1 serving of nuts or seeds equals  cup.  Choose whole grains more often. Have 3 servings per day (a serving is 1 ounce [oz]).  Eat 4 to 5 servings of vegetables per day. A serving of vegetables is 1 cup of raw leafy vegetables;  cup of raw or cooked cut-up vegetables;  cup of vegetable juice.  Eat 4 to 5 servings of fruit per day. A serving of fruit is 1 medium whole fruit;  cup of dried fruit;  cup of fresh, frozen, or canned fruit;  cup of 100% fruit juice.  Increase your intake of dietary fiber to 20 to 30 grams per day. Insoluble fiber may help lower your risk of heart disease and may help curb your appetite.  Soluble fiber binds cholesterol to be removed from the blood. Foods high in soluble fiber are dried beans, citrus fruits, oats, apples, bananas, broccoli, Brussels sprouts, and eggplant.  Try  to include foods fortified with plant sterols or stanols, such as yogurt, breads, juices, or margarines. Choose several fortified foods to achieve a daily intake of 2 to 3 grams of plant sterols or stanols.  Foods with omega-3 fats can help reduce your risk of heart disease. Aim to have a 3.5 oz portion of fatty fish twice per week, such as salmon, mackerel, albacore tuna, sardines, lake trout, or herring. If you wish to take a fish oil supplement, choose one that contains 1 gram of both DHA and EPA.  Limit processed meats to 2 servings (3 oz portion) weekly.  Limit the sodium in your diet to 1500 milligrams (mg) per day. If you have high blood pressure, talk to a registered dietitian about a DASH (Dietary Approaches to Stop Hypertension) eating plan.  Limit sweets and beverages with added sugar, such as soda, to no more than 5 servings per week. One serving is:   1 tablespoon sugar.  1 tablespoon jelly or jam.   cup sorbet.  1 cup lemonade.   cup regular soda. CHOOSING FOODS Starches  Allowed: Breads: All kinds (wheat, rye, raisin, white, oatmeal, [place], [place], and English muffin bread). Low-fat rolls: English muffins, frankfurter and hamburger buns, bagels, pita bread, tortillas (not fried). Pancakes, waffles, biscuits, and muffins made with  recommended oil.  Avoid: Products made with saturated or trans fats, oils, or whole milk products. Butter rolls, cheese breads, croissants. Commercial doughnuts, muffins, sweet rolls, biscuits, waffles, pancakes, store-bought mixes. Crackers  Allowed: Low-fat crackers and snacks: Animal, graham, rye, saltine (with recommended oil, no lard), oyster, and matzo crackers. Bread sticks, melba toast, rusks, flatbread, pretzels, and light popcorn.  Avoid: High-fat crackers: cheese crackers, butter crackers, and those made with coconut, palm oil, or trans fat (hydrogenated oils). Buttered popcorn. Cereals  Allowed: Hot or cold whole-grain  cereals.  Avoid: Cereals containing coconut, hydrogenated vegetable fat, or animal fat. Potatoes / Pasta / Rice  Allowed: All kinds of potatoes, rice, and pasta (such as macaroni, spaghetti, and noodles).  Avoid: Pasta or rice prepared with cream sauce or high-fat cheese. Chow mein noodles, [place] fries. Vegetables  Allowed: All vegetables and vegetable juices.  Avoid: Fried vegetables. Vegetables in cream, butter, or high-fat cheese sauces. Limit coconut. Fruit in cream or custard. Protein  Allowed: Limit your intake of meat, seafood, and poultry to no more than 6 oz (cooked weight) per day. All lean, well-trimmed beef, veal, pork, and lamb. All chicken and [place] without skin. All fish and shellfish. Wild game: wild duck, rabbit, pheasant, and venison. Egg whites or low-cholesterol egg substitutes may be used as desired. Meatless dishes: recipes with dried beans, peas, lentils, and tofu (soybean curd). Seeds and nuts: all seeds and most nuts.  Avoid: Prime grade and other heavily marbled and fatty meats, such as short ribs, spare ribs, rib eye roast or steak, frankfurters, sausage, bacon, and high-fat luncheon meats, mutton. Caviar. Commercially fried fish. Domestic duck, goose, venison sausage. Organ meats: liver, gizzard, heart, chitterlings, brains, kidney, sweetbreads. Dairy  Allowed: Low-fat cheeses: nonfat or low-fat cottage cheese (1% or 2% fat), cheeses made with part skim milk, such as mozzarella, farmers, string, or ricotta. (Cheeses should be labeled no more than 2 to 6 grams fat per oz.). Skim (or 1%) milk: liquid, powdered, or evaporated. Buttermilk made with low-fat milk. Drinks made with skim or low-fat milk or cocoa. Chocolate milk or cocoa made with skim or low-fat (1%) milk. Nonfat or low-fat yogurt.  Avoid: Whole milk cheeses, including colby, cheddar, [place], [address], [place], [place], [place], [address], [place], and blue. Creamed cottage cheese, cream cheese. Whole  milk and whole milk products, including buttermilk or yogurt made from whole milk, drinks made from whole milk. Condensed milk, evaporated whole milk, and 2% milk. Soups and Combination Foods  Allowed: Low-fat low-sodium soups: broth, dehydrated soups, homemade broth, soups with the fat removed, homemade cream soups made with skim or low-fat milk. Low-fat spaghetti, lasagna, chili, and Spanish rice if low-fat ingredients and low-fat cooking techniques are used.  Avoid: Cream soups made with whole milk, cream, or high-fat cheese. All other soups. Desserts and Sweets  Allowed: Sherbet, fruit ices, gelatins, meringues, and angel food cake. Homemade desserts with recommended fats, oils, and milk products. Jam, jelly, honey, marmalade, sugars, and syrups. Pure sugar candy, such as gum drops, hard candy, jelly beans, marshmallows, mints, and small amounts of dark chocolate.  Avoid: Commercially prepared cakes, pies, cookies, frosting, pudding, or mixes for these products. Desserts containing whole milk products, chocolate, coconut, lard, palm oil, or palm kernel oil. Ice cream or ice cream drinks. Candy that contains chocolate, coconut, butter, hydrogenated fat, or unknown ingredients. Buttered syrups. Fats and Oils  Allowed: Vegetable oils: safflower, sunflower, corn, soybean, cottonseed, sesame, canola, olive, or peanut. Non-hydrogenated margarines. Salad dressing or mayonnaise:  homemade or commercial, made with a recommended oil. Low or nonfat salad dressing or mayonnaise.  Limit added fats and oils to 6 to 8 tsp per day (includes fats used in cooking, baking, salads, and spreads on bread). Remember to count the "hidden fats" in foods.  Avoid: Solid fats and shortenings: butter, lard, salt pork, bacon drippings. Gravy containing meat fat, shortening, or suet. Cocoa butter, coconut. Coconut oil, palm oil, palm kernel oil, or hydrogenated oils: these ingredients are often used in bakery products,  nondairy creamers, whipped toppings, candy, and commercially fried foods. Read labels carefully. Salad dressings made of unknown oils, sour cream, or cheese, such as blue cheese and Roquefort. Cream, all kinds: half-and-half, light, heavy, or whipping. Sour cream or cream cheese (even if "light" or low-fat). Nondairy cream substitutes: coffee creamers and sour cream substitutes made with palm, palm kernel, hydrogenated oils, or coconut oil. Beverages  Allowed: Coffee (regular or decaffeinated), tea. Diet carbonated beverages, mineral water. Alcohol: Check with your caregiver. Moderation is recommended.  Avoid: Whole milk, regular sodas, and juice drinks with added sugar. Condiments  Allowed: All seasonings and condiments. Cocoa powder. "Cream" sauces made with recommended ingredients.  Avoid: Carob powder made with hydrogenated fats. SAMPLE MENU Breakfast   cup orange juice   cup oatmeal  1 slice toast  1 tsp margarine  1 cup skim milk Lunch  [place] sandwich with 2 oz [place], 2 slices bread  Lettuce and tomato slices  Fresh fruit  Carrot sticks  Coffee or tea Snack  Fresh fruit or low-fat crackers Dinner  3 oz lean ground beef  1 baked potato  1 tsp margarine   cup asparagus  Lettuce salad  1 tbs non-creamy dressing   cup peach slices  1 cup skim milk Document Released: 05/26/2008 Document Revised: 02/16/2012 Document Reviewed: 11/10/2011 [employer] Patient Information 2014 [place], [place].

## 2013-06-28 NOTE — Addendum Note (Signed)
Addended by: Lorin Mercy K on: 06/28/2013 09:00 AM   Modules accepted: Orders

## 2013-08-30 ENCOUNTER — Telehealth: Payer: Self-pay | Admitting: Internal Medicine

## 2013-08-30 NOTE — Telephone Encounter (Signed)
Pt states she was in office this morning with her sister, Michaelle Copas, for an appt.  States she is now feeling bad with cough and stopped up nose, is getting what her sister has.  Pt is asking for Tamiflu.  States she is going out of town, will be gone for 1.5 months.

## 2013-08-30 NOTE — Telephone Encounter (Signed)
Please advise 

## 2013-08-30 NOTE — Telephone Encounter (Signed)
No. She needs to be seen if she thinks she has the flu.

## 2013-09-01 NOTE — Telephone Encounter (Signed)
Left detailed message on patient voicemail stating she would need to be seen. Office closed at 2:00 on Wednesday, the reason she did not receive a return call until today. If any other questions or concerns then she could call the office back.

## 2013-09-06 NOTE — Telephone Encounter (Signed)
Patient never returned call  

## 2013-10-14 ENCOUNTER — Other Ambulatory Visit: Payer: Self-pay | Admitting: Cardiovascular Disease

## 2013-11-06 ENCOUNTER — Ambulatory Visit (INDEPENDENT_AMBULATORY_CARE_PROVIDER_SITE_OTHER): Payer: Commercial Managed Care - HMO | Admitting: Cardiovascular Disease

## 2013-11-06 ENCOUNTER — Encounter: Payer: Self-pay | Admitting: Cardiovascular Disease

## 2013-11-06 ENCOUNTER — Other Ambulatory Visit: Payer: Self-pay | Admitting: Internal Medicine

## 2013-11-06 VITALS — BP 136/80 | HR 60 | Ht 62.0 in | Wt 161.2 lb

## 2013-11-06 DIAGNOSIS — R42 Dizziness and giddiness: Secondary | ICD-10-CM

## 2013-11-06 DIAGNOSIS — I25810 Atherosclerosis of coronary artery bypass graft(s) without angina pectoris: Secondary | ICD-10-CM

## 2013-11-06 DIAGNOSIS — I739 Peripheral vascular disease, unspecified: Secondary | ICD-10-CM

## 2013-11-06 DIAGNOSIS — E785 Hyperlipidemia, unspecified: Secondary | ICD-10-CM

## 2013-11-06 DIAGNOSIS — I10 Essential (primary) hypertension: Secondary | ICD-10-CM

## 2013-11-06 NOTE — Assessment & Plan Note (Signed)
She has dizziness and fatigue when taking Coreg and HCTZ. We have suggested she take 3.125 mg Coreg at nighttime, HCTZ 25 mg, monitor her blood pressure

## 2013-11-06 NOTE — Telephone Encounter (Signed)
Okay to refill? 

## 2013-11-06 NOTE — Assessment & Plan Note (Signed)
Currently with no symptoms of angina. No further workup at this time. Continue current medication regimen. 

## 2013-11-06 NOTE — Assessment & Plan Note (Signed)
We will check her lipid panel today's visit

## 2013-11-06 NOTE — Assessment & Plan Note (Signed)
Encouraged her to stay on her Crestor 5 mg daily. Carotid disease followed in [facility]

## 2013-11-06 NOTE — Progress Notes (Signed)
Patient ID: Catherine Green, female    DOB: 10-11-1940, 73 y.o.   MRN: [id number]  HPI Comments: [name] is a very pleasant 73 year old woman with history coronary artery disease, bypass surgery, peripheral vascular disease with history of right carotid endarterectomy followed by [name], who presents for routine followup. Last catheterization in January 2009 showing patent vein grafts, atretic LIMA to the LAD.    Carotid ultrasound shows 50-70% disease on the left.  Overall she is doing well. She is tolerating Crestor 5 mg daily.  She previously had "hair loss" on simvastatin, as well as sores on her head. Lipitor caused leg problems/myalgias.  She is not particularly interested in fenofibrate, WelChol. Too many pills. He She has had problems with zetia.   Today she reports that she has stopped her "nerve" pill. Significant stressors as husband is sick. She does report having fatigue, dizziness after she takes carvedilol in the morning. She has been taking one half pill twice a day, one half HCTZ daily. Also reports having some abdominal pressure, difficulty swallowing. Otherwise she is active, uses the treadmill regularly with no symptoms She denies any significant shortness of breath or chest pain. No other complaints. She's not been taking her potassium    EKG shows normal sinus rhythm with rate of 60 beats per minute, nonspecific ST abnormality        Outpatient Encounter Prescriptions as of 11/06/2013  Medication Sig  . ALPRAZolam (XANAX) 0.5 MG tablet TAKE 1 TABLET BY MOUTH AT BEDTIME AS NEEDED FOR SLEEP OR ANXIETY  . aspirin 81 MG tablet Take 162 mg by mouth daily.   . carvedilol (COREG) 6.25 MG tablet TAKE 1 TABLET BY MOUTH TWICE A DAY WITH FOOD  . CRESTOR 10 MG tablet TAKE 1 TABLET BY MOUTH EVERY DAY  . cyanocobalamin 500 MCG tablet Take 500 mcg by mouth daily.  . hydrochlorothiazide (HYDRODIURIL) 50 MG tablet TAKE 1 TABLET (50 MG TOTAL) BY MOUTH DAILY.  . Multiple  Vitamin (MULTIVITAMIN) tablet Take 1 tablet by mouth daily.  . rosuvastatin (CRESTOR) 10 MG tablet Take 5 mg by mouth daily.    Review of Systems  Constitutional: Positive for fatigue.  HENT: Negative.   Eyes: Negative.   Respiratory: Negative.   Cardiovascular: Negative.   Gastrointestinal: Negative.   Endocrine: Negative.   Musculoskeletal: Negative.   Skin: Negative.   Allergic/Immunologic: Negative.   Neurological: Positive for dizziness.  Hematological: Negative.   Psychiatric/Behavioral: Negative.   All other systems reviewed and are negative.    BP 136/80  Pulse 60  Ht 5\' 2"  (1.575 m)  Wt 161 lb 4 oz (73.143 kg)  BMI 29.49 kg/m2  Physical Exam  Nursing note and vitals reviewed. Constitutional: She is oriented to person, place, and time. She appears well-developed and well-nourished.  HENT:  Head: Normocephalic.  Nose: Nose normal.  Mouth/Throat: Oropharynx is clear and moist.  Eyes: Conjunctivae are normal. Pupils are equal, round, and reactive to light.  Neck: Normal range of motion. Neck supple. No JVD present.  Cardiovascular: Normal rate, regular rhythm, S1 normal, S2 normal, normal heart sounds and intact distal pulses.  Exam reveals no gallop and no friction rub.   No murmur heard. Pulmonary/Chest: Effort normal and breath sounds normal. No respiratory distress. She has no wheezes. She has no rales. She exhibits no tenderness.  Abdominal: Soft. Bowel sounds are normal. She exhibits no distension. There is no tenderness.  Musculoskeletal: Normal range of motion. She exhibits no edema  and no tenderness.  Lymphadenopathy:    She has no cervical adenopathy.  Neurological: She is alert and oriented to person, place, and time. Coordination normal.  Skin: Skin is warm and dry. No rash noted. No erythema.  Psychiatric: She has a normal mood and affect. Her behavior is normal. Judgment and thought content normal.    Assessment and Plan       He

## 2013-11-06 NOTE — Patient Instructions (Addendum)
Please take coreg 1/2 pill at night Take HCTZ 1/2 pill daily Take potassium daily  Please call us if you have new issues that need to be addressed before your next appt.  Your physician wants you to follow-up in: 6 months.  You will receive a reminder letter in the mail two months in advance. If you don't receive a letter, please call our office to schedule the follow-up appointment.

## 2013-11-07 ENCOUNTER — Telehealth: Payer: Self-pay | Admitting: Internal Medicine

## 2013-11-07 ENCOUNTER — Ambulatory Visit (INDEPENDENT_AMBULATORY_CARE_PROVIDER_SITE_OTHER): Payer: Commercial Managed Care - HMO | Admitting: *Deleted

## 2013-11-07 DIAGNOSIS — E785 Hyperlipidemia, unspecified: Secondary | ICD-10-CM

## 2013-11-07 DIAGNOSIS — I25810 Atherosclerosis of coronary artery bypass graft(s) without angina pectoris: Secondary | ICD-10-CM

## 2013-11-07 NOTE — Telephone Encounter (Signed)
Relevant patient education assigned to patient using Emmi. ° °

## 2013-11-20 ENCOUNTER — Telehealth: Payer: Self-pay

## 2013-11-20 NOTE — Telephone Encounter (Signed)
Pt called and states that she is swelling significantly. States she has taken 2 fluid pill, and dropped 6 pounds. States "I couldn't half breathe." (this was on Friday.) Pt made an appt for this Thursday morning. Please call.

## 2013-11-20 NOTE — Telephone Encounter (Signed)
Spoke w/ pt.  She states that on Fri, she weighed 156lbs. On Sat, she  felt "fullness" up under her breast, had difficulty breathing and weighed 162. She slept w/ her head elevated, took 3 lasix  (she has leftover lasix 20mg  at home),and her wt went back to 156 on Sun. Reports that her stomach has gone back down, she can breathe better, but continues to feel weak.  Reports that her usual diet consists of egg whites for breakfast, green beans and grilled chicken for lunch, and some type of vegetable for dinner.  States that she avoids salt. Reports that her HCTZ was cut in half at her last ov on 11/06/13 and this does not seem to be working for her.  States that the previous regimen did not work for her either, and she would like to know what other options Dr. Rockey Situ would like to try, as she cannot stand being this weak and having difficulty breathing when her fluid increases.  Please advise.  Thank you.

## 2013-11-21 NOTE — Telephone Encounter (Signed)
Could try lasix prn 20 mg with potassium Need to watch fluids intake if weights are correct  If sx persist, need an echocardiogram. None on file looking at heart function, valves

## 2013-11-22 ENCOUNTER — Other Ambulatory Visit: Payer: Self-pay

## 2013-11-22 MED ORDER — FUROSEMIDE 20 MG PO TABS: 20.0000 mg | ORAL_TABLET | Freq: Every day | ORAL | Status: DC | PRN

## 2013-11-22 NOTE — Telephone Encounter (Signed)
Spoke w/ pt.  She reports that she has been taking lasix 20mg  once daily and being very careful w/ her fluid intake.  Advised her to continue to do this and keep a record to bring w/ her to her appt tomorrow. She is agreeable w/ this and will discuss echo at that time.

## 2013-11-23 ENCOUNTER — Ambulatory Visit (INDEPENDENT_AMBULATORY_CARE_PROVIDER_SITE_OTHER): Payer: Medicare PPO | Admitting: Cardiovascular Disease

## 2013-11-23 ENCOUNTER — Encounter: Payer: Self-pay | Admitting: Cardiovascular Disease

## 2013-11-23 VITALS — BP 142/78 | HR 70 | Ht 62.0 in | Wt 160.0 lb

## 2013-11-23 DIAGNOSIS — R0602 Shortness of breath: Secondary | ICD-10-CM

## 2013-11-23 DIAGNOSIS — I509 Heart failure, unspecified: Secondary | ICD-10-CM

## 2013-11-23 DIAGNOSIS — E785 Hyperlipidemia, unspecified: Secondary | ICD-10-CM

## 2013-11-23 DIAGNOSIS — I10 Essential (primary) hypertension: Secondary | ICD-10-CM

## 2013-11-23 DIAGNOSIS — I5032 Chronic diastolic (congestive) heart failure: Secondary | ICD-10-CM | POA: Insufficient documentation

## 2013-11-23 DIAGNOSIS — R002 Palpitations: Secondary | ICD-10-CM | POA: Insufficient documentation

## 2013-11-23 DIAGNOSIS — R0789 Other chest pain: Secondary | ICD-10-CM

## 2013-11-23 DIAGNOSIS — I5033 Acute on chronic diastolic (congestive) heart failure: Secondary | ICD-10-CM

## 2013-11-23 DIAGNOSIS — I739 Peripheral vascular disease, unspecified: Secondary | ICD-10-CM

## 2013-11-23 DIAGNOSIS — I25810 Atherosclerosis of coronary artery bypass graft(s) without angina pectoris: Secondary | ICD-10-CM

## 2013-11-23 NOTE — Progress Notes (Signed)
Patient ID: Catherine Green, female    DOB: March 09, 1941, 73 y.o.   MRN: [id number]  HPI Comments: [name] is a very pleasant 73 year old woman with history coronary artery disease, bypass surgery, peripheral vascular disease with history of right carotid endarterectomy followed by [name], who presents for routine followup. Last catheterization in January 2009 showing patent vein grafts, atretic LIMA to the LAD.    Carotid ultrasound shows 50-70% disease on the left.  We received a phone call with reports of shortness of breath and weight gain. Prescribed Lasix to take as needed. She has tried this recently with mild improvement of her symptoms and weight loss. She reports drinking significant amount of fluids daily as she was told to drink 6-8 glasses of extra water. She denies significant leg edema but does report having abdominal fullness, chest fullness, shortness of breath, particularly when supine. She does report having significant stressors as her husband is sick. Carvedilol was decreased secondary to fatigue, dizziness after carvedilol in the morning. She was taking one half pill twice a day, now reports she is taking one half pill once a day in the evening. She has been having more palpitations in the evenings.   On previous visits, she had "hair loss" on simvastatin, as well as sores on her head. Lipitor caused leg problems/myalgias.  She is not particularly interested in fenofibrate, WelChol. Too many pills. He She has had problems with zetia.  Tolerating low-dose Crestor  Today she reports that she has stopped her "nerve" pill. Significant stressors as husband is sick. She was previously using the treadmill on a regular basis    EKG shows normal sinus rhythm with rate of 70 beats per minute, nonspecific ST abnormality        Outpatient Encounter Prescriptions as of 11/23/2013  Medication Sig  . ALPRAZolam (XANAX) 0.5 MG tablet TAKE 1 TABLET BY MOUTH AT BEDTIME AS NEEDED  FOR SLEEP OR ANXIETY  . aspirin 81 MG tablet Take 162 mg by mouth daily.   . carvedilol (COREG) 6.25 MG tablet TAKE 1 TABLET BY MOUTH TWICE A DAY WITH FOOD  . cyanocobalamin 500 MCG tablet Take 500 mcg by mouth daily.  . furosemide (LASIX) 20 MG tablet Take 1 tablet (20 mg total) by mouth daily as needed for fluid.  . hydrochlorothiazide (HYDRODIURIL) 50 MG tablet TAKE 1 TABLET (50 MG TOTAL) BY MOUTH DAILY.  . Multiple Vitamin (MULTIVITAMIN) tablet Take 1 tablet by mouth daily.  . rosuvastatin (CRESTOR) 10 MG tablet Take 5 mg by mouth daily.   Review of Systems  Constitutional: Positive for fatigue.  HENT: Negative.   Eyes: Negative.   Respiratory: Positive for shortness of breath.   Cardiovascular: Positive for palpitations.  Gastrointestinal: Negative.   Endocrine: Negative.   Musculoskeletal: Negative.   Skin: Negative.   Allergic/Immunologic: Negative.   Neurological: Negative.   Hematological: Negative.   Psychiatric/Behavioral: Negative.   All other systems reviewed and are negative.    BP 142/78  Pulse 70  Ht 5\' 2"  (1.575 m)  Wt 160 lb (72.576 kg)  BMI 29.26 kg/m2  Physical Exam  Nursing note and vitals reviewed. Constitutional: She is oriented to person, place, and time. She appears well-developed and well-nourished.  HENT:  Head: Normocephalic.  Nose: Nose normal.  Mouth/Throat: Oropharynx is clear and moist.  Eyes: Conjunctivae are normal. Pupils are equal, round, and reactive to light.  Neck: Normal range of motion. Neck supple. No JVD present.  Cardiovascular: Normal rate,  regular rhythm, S1 normal, S2 normal, normal heart sounds and intact distal pulses.  Exam reveals no gallop and no friction rub.   No murmur heard. Pulmonary/Chest: Effort normal and breath sounds normal. No respiratory distress. She has no wheezes. She has no rales. She exhibits no tenderness.  Abdominal: Soft. Bowel sounds are normal. She exhibits no distension. There is no tenderness.   Musculoskeletal: Normal range of motion. She exhibits no edema and no tenderness.  Lymphadenopathy:    She has no cervical adenopathy.  Neurological: She is alert and oriented to person, place, and time. Coordination normal.  Skin: Skin is warm and dry. No rash noted. No erythema.  Psychiatric: She has a normal mood and affect. Her behavior is normal. Judgment and thought content normal.    Assessment and Plan

## 2013-11-23 NOTE — Assessment & Plan Note (Signed)
Currently with no symptoms of angina. No further workup at this time. Continue current medication regimen. If we cannot improve her shortness of breath with aggressive diuresis, we may need to consider ischemia workup.

## 2013-11-23 NOTE — Assessment & Plan Note (Signed)
We have recommended that she take Lasix daily for several days then as needed for weight gain and shortness of breath

## 2013-11-23 NOTE — Assessment & Plan Note (Signed)
Significant peripheral vascular disease. Stressed to her the importance of aggressive cholesterol control

## 2013-11-23 NOTE — Assessment & Plan Note (Signed)
She has palpitations in the evenings. Unable to exclude arrhythmia even atrial fibrillation. We will try higher dose Coreg 6.25 mg in the evening. She has had relative intolerance of beta blockers secondary to fatigue. If she continues to have palpitations, we have suggested she have a 48-hour Holter

## 2013-11-23 NOTE — Assessment & Plan Note (Signed)
Significant statin intolerance. We'll continue as much Crestor as tolerated

## 2013-11-23 NOTE — Assessment & Plan Note (Signed)
Currently with no symptoms of angina. No further workup at this time. Continue current medication regimen. 

## 2013-11-23 NOTE — Patient Instructions (Signed)
You are doing well.  Please continue to take lasix as needed with potassium  Please increase the coreg up to 6 mg at night  Please call us if you have new issues that need to be addressed before your next appt.  Your physician wants you to follow-up in: 3 months.  You will receive a reminder letter in the mail two months in advance. If you don't receive a letter, please call our office to schedule the follow-up appointment.

## 2013-12-05 ENCOUNTER — Telehealth: Payer: Self-pay | Admitting: Internal Medicine

## 2013-12-05 NOTE — Telephone Encounter (Signed)
Pt came in office to give referral information.  Copy of [employer] card scanned.  Referrals to:  Pt brought copy with doctors names and addresses.  Copy made and placed in Amber's box.  Please call pt when this is going through.  She will call and schedule appt herself.

## 2013-12-08 NOTE — Telephone Encounter (Signed)
Pt approved to see Allyson Sabal with 4 visits exp 03/13/14. Auth # [id number] Pt approved to see [place] with 4 visits exp 03/15/14. Auth # [id number] Pt approved to see [facility] with 4 visits exp 05/24/14. Auth # [id number]

## 2013-12-08 NOTE — Telephone Encounter (Signed)
Referral to [facility] for Catherine Green and Catherine Green underway

## 2013-12-19 ENCOUNTER — Telehealth: Payer: Self-pay | Admitting: Internal Medicine

## 2013-12-19 NOTE — Telephone Encounter (Signed)
Pt left vm needing appt.  Returned pt call; states she is having dizziness.  Advised to appt available with Dr. Gilford Rile; offered R. [place].  Pt refused.  Next available with Dr. Gilford Rile 5/11, pt accepts.  States she will take zyrtec.  Pt states when she bends over sometimes she feels like she has to hold on to something.  Pt refused to be transferred to triage.

## 2013-12-19 NOTE — Telephone Encounter (Signed)
Noted  

## 2013-12-19 NOTE — Telephone Encounter (Signed)
Pt aware; appt accepted.

## 2013-12-19 NOTE — Telephone Encounter (Signed)
We can add at 2pm on Monday. 60min slot

## 2013-12-19 NOTE — Telephone Encounter (Signed)
Please read below and advise.

## 2013-12-25 ENCOUNTER — Ambulatory Visit (INDEPENDENT_AMBULATORY_CARE_PROVIDER_SITE_OTHER): Payer: Medicare PPO | Admitting: Internal Medicine

## 2013-12-25 ENCOUNTER — Encounter: Payer: Self-pay | Admitting: Family

## 2013-12-25 ENCOUNTER — Encounter: Payer: Self-pay | Admitting: Internal Medicine

## 2013-12-25 VITALS — BP 128/70 | HR 60 | Temp 98.2°F | Resp 14 | Wt 161.0 lb

## 2013-12-25 DIAGNOSIS — H6690 Otitis media, unspecified, unspecified ear: Secondary | ICD-10-CM

## 2013-12-25 DIAGNOSIS — H6691 Otitis media, unspecified, right ear: Secondary | ICD-10-CM

## 2013-12-25 DIAGNOSIS — Z9289 Personal history of other medical treatment: Secondary | ICD-10-CM

## 2013-12-25 DIAGNOSIS — R1314 Dysphagia, pharyngoesophageal phase: Secondary | ICD-10-CM

## 2013-12-25 DIAGNOSIS — Z9189 Other specified personal risk factors, not elsewhere classified: Secondary | ICD-10-CM

## 2013-12-25 DIAGNOSIS — R42 Dizziness and giddiness: Secondary | ICD-10-CM

## 2013-12-25 DIAGNOSIS — L989 Disorder of the skin and subcutaneous tissue, unspecified: Secondary | ICD-10-CM

## 2013-12-25 DIAGNOSIS — R1319 Other dysphagia: Secondary | ICD-10-CM | POA: Insufficient documentation

## 2013-12-25 LAB — CBC WITH DIFFERENTIAL/PLATELET
Basophils Absolute: 0 10*3/uL (ref 0.0–0.1)
Basophils Relative: 0.3 % (ref 0.0–3.0)
Eosinophils Absolute: 0.1 10*3/uL (ref 0.0–0.7)
Eosinophils Relative: 0.8 % (ref 0.0–5.0)
HCT: 38.4 % (ref 36.0–46.0)
Hemoglobin: 12.7 g/dL (ref 12.0–15.0)
Lymphocytes Relative: 28.6 % (ref 12.0–46.0)
Lymphs Abs: 2.3 10*3/uL (ref 0.7–4.0)
MCHC: 33 g/dL (ref 30.0–36.0)
MCV: 83.9 fl (ref 78.0–100.0)
Monocytes Absolute: 0.7 10*3/uL (ref 0.1–1.0)
Monocytes Relative: 8.4 % (ref 3.0–12.0)
Neutro Abs: 4.9 10*3/uL (ref 1.4–7.7)
Neutrophils Relative %: 61.9 % (ref 43.0–77.0)
Platelets: 365 10*3/uL (ref 150.0–400.0)
RBC: 4.58 Mil/uL (ref 3.87–5.11)
RDW: 14.3 % (ref 11.5–14.6)
WBC: 7.9 10*3/uL (ref 4.5–10.5)

## 2013-12-25 LAB — COMPREHENSIVE METABOLIC PANEL
ALT: 17 U/L (ref 0–35)
AST: 20 U/L (ref 0–37)
Albumin: 4.3 g/dL (ref 3.5–5.2)
Alkaline Phosphatase: 80 U/L (ref 39–117)
BUN: 16 mg/dL (ref 6–23)
CO2: 37 mEq/L — ABNORMAL HIGH (ref 19–32)
Calcium: 9.9 mg/dL (ref 8.4–10.5)
Chloride: 92 mEq/L — ABNORMAL LOW (ref 96–112)
Creatinine, Ser: 0.8 mg/dL (ref 0.4–1.2)
GFR: 79.11 mL/min (ref >60.00)
Glucose, Bld: 86 mg/dL (ref 70–99)
Potassium: 3.6 mEq/L (ref 3.5–5.1)
Sodium: 139 mEq/L (ref 135–145)
Total Bilirubin: 0.5 mg/dL (ref 0.3–1.2)
Total Protein: 6.9 g/dL (ref 6.0–8.3)

## 2013-12-25 LAB — VITAMIN B12: Vitamin B-12: 542 pg/mL (ref 211–911)

## 2013-12-25 LAB — TSH: TSH: 2 u[IU]/mL (ref 0.35–5.50)

## 2013-12-25 MED ORDER — AMOXICILLIN-POT CLAVULANATE 875-125 MG PO TABS: 1.0000 | ORAL_TABLET | Freq: Two times a day (BID) | ORAL | Status: DC

## 2013-12-25 NOTE — Progress Notes (Signed)
Pre visit review using our clinic review tool, if applicable. No additional management support is needed unless otherwise documented below in the visit note. 

## 2013-12-25 NOTE — Assessment & Plan Note (Signed)
Right otitis media noted on exam. Will treat with Augmentin. Plan follow up with her ENT in 3-4 weeks and here as needed. She will call if symptoms are not improving.

## 2013-12-25 NOTE — Assessment & Plan Note (Signed)
Chronic dysphagia with solid foods. Will set up GI evaluation for upper endoscopy.

## 2013-12-25 NOTE — Progress Notes (Signed)
Subjective:    Patient ID: Catherine Green, female    DOB: Feb 17, 1941, 73 y.o.   MRN: [id number]  HPI 73YO female presents for acute visit.  Dizziness - Ongoing for a few weeks. Described as lightheadedness. Was instructed by her cardiologist to try Zyrtec. Tried but only minimal improvement. No fever, headache. Occasionally hears a "whooshing" in her right ear. Mild nasal congestion noted. No ear pain. No change in hearing. No other focal symptoms. No chest pain, palpitations.  Dysphagia - Several months of food becoming "caught" in her mid chest after eating. No nausea, vomiting. Has never been evaluated in the past. No chest pain.   Review of Systems  Constitutional: Negative for fever, chills, appetite change, fatigue and unexpected weight change.  HENT: Positive for congestion, postnasal drip, rhinorrhea, sneezing and trouble swallowing. Negative for ear pain, sinus pressure, sore throat and voice change.   Eyes: Negative for visual disturbance.  Respiratory: Negative for cough, shortness of breath, wheezing and stridor.   Cardiovascular: Negative for chest pain, palpitations and leg swelling.  Gastrointestinal: Negative for nausea, vomiting, abdominal pain, diarrhea, constipation, blood in stool, abdominal distention and anal bleeding.  Genitourinary: Negative for dysuria and flank pain.  Musculoskeletal: Negative for arthralgias, gait problem, myalgias and neck pain.  Skin: Negative for color change and rash.  Neurological: Positive for dizziness and light-headedness. Negative for tremors, syncope, speech difficulty, weakness, numbness and headaches.  Hematological: Negative for adenopathy. Does not bruise/bleed easily.  Psychiatric/Behavioral: Negative for suicidal ideas, sleep disturbance and dysphoric mood. The patient is not nervous/anxious.        Objective:    BP 128/70  Pulse 60  Temp(Src) 98.2 F (36.8 C) (Oral)  Resp 14  Wt 161 lb (73.029 kg)  SpO2 96% Physical  Exam  Constitutional: She is oriented to person, place, and time. She appears well-developed and well-nourished. No distress.  HENT:  Head: Normocephalic and atraumatic.  Right Ear: External ear and ear canal normal. Tympanic membrane is erythematous and bulging. A middle ear effusion is present.  Left Ear: Tympanic membrane, external ear and ear canal normal.  Nose: Nose normal.  Mouth/Throat: Oropharynx is clear and moist. No oropharyngeal exudate.  Eyes: Conjunctivae are normal. Pupils are equal, round, and reactive to light. Right eye exhibits no discharge. Left eye exhibits no discharge. No scleral icterus.  Neck: Normal range of motion. Neck supple. No tracheal deviation present. No thyromegaly present.  Cardiovascular: Normal rate, regular rhythm, normal heart sounds and intact distal pulses.  Exam reveals no gallop and no friction rub.   No murmur heard. Pulmonary/Chest: Effort normal and breath sounds normal. No accessory muscle usage. Not tachypneic. No respiratory distress. She has no decreased breath sounds. She has no wheezes. She has no rhonchi. She has no rales. She exhibits no tenderness.  Musculoskeletal: Normal range of motion. She exhibits no edema and no tenderness.  Lymphadenopathy:    She has no cervical adenopathy.  Neurological: She is alert and oriented to person, place, and time. No cranial nerve deficit. She exhibits normal muscle tone. Coordination normal.  Skin: Skin is warm and dry. No rash noted. She is not diaphoretic. No erythema. No pallor.  Psychiatric: She has a normal mood and affect. Her behavior is normal. Judgment and thought content normal.          Assessment & Plan:   Problem List Items Addressed This Visit   Dizziness - Primary     Recent symptoms of dizziness most likely  related to right otitis media. Labs today including electrolytes, CBC, TSH, B12 normal. Follow up carotid doppler pending for later this month. Will treat otitis with  Augmentin and follow up in 2 weeks or sooner as needed.    Relevant Orders      Comprehensive metabolic panel (Completed)      CBC with Differential (Completed)      TSH (Completed)      B12 (Completed)      Ambulatory referral to ENT   Dysphagia, pharyngoesophageal phase     Chronic dysphagia with solid foods. Will set up GI evaluation for upper endoscopy.    Relevant Orders      Ambulatory referral to Gastroenterology   Right otitis media     Right otitis media noted on exam. Will treat with Augmentin. Plan follow up with her ENT in 3-4 weeks and here as needed. She will call if symptoms are not improving.    Relevant Medications      AMOXICILLIN-POT CLAVULANATE 875-125 MG PO TABS    Other Visit Diagnoses   Skin lesion        Relevant Orders       Ambulatory referral to Dermatology    H/O complete eye exam        Relevant Orders       Ambulatory referral to Ophthalmology        Return in about 3 weeks (around 01/15/2014).

## 2013-12-25 NOTE — Assessment & Plan Note (Signed)
Recent symptoms of dizziness most likely related to right otitis media. Labs today including electrolytes, CBC, TSH, B12 normal. Follow up carotid doppler pending for later this month. Will treat otitis with Augmentin and follow up in 2 weeks or sooner as needed.

## 2013-12-25 NOTE — Patient Instructions (Signed)
Start Augmentin twice daily to help clear the infection in the right ear.  Labs today.  Follow up 2-3 weeks.

## 2013-12-26 ENCOUNTER — Ambulatory Visit (HOSPITAL_COMMUNITY)
Admission: RE | Admit: 2013-12-26 | Discharge: 2013-12-26 | Disposition: A | Discharge status: Home / Self Care | Payer: Medicare HMO | Source: Ambulatory Visit | Attending: Family | Admitting: Family

## 2013-12-26 ENCOUNTER — Ambulatory Visit (INDEPENDENT_AMBULATORY_CARE_PROVIDER_SITE_OTHER): Payer: Commercial Managed Care - HMO | Admitting: Family

## 2013-12-26 ENCOUNTER — Encounter: Payer: Self-pay | Admitting: Family

## 2013-12-26 VITALS — BP 147/64 | HR 54 | Resp 14 | Ht 62.0 in | Wt 161.0 lb

## 2013-12-26 DIAGNOSIS — I6529 Occlusion and stenosis of unspecified carotid artery: Secondary | ICD-10-CM

## 2013-12-26 DIAGNOSIS — Z48812 Encounter for surgical aftercare following surgery on the circulatory system: Secondary | ICD-10-CM

## 2013-12-26 NOTE — Patient Instructions (Signed)

## 2013-12-26 NOTE — Progress Notes (Signed)
Established Carotid Patient   History of Present Illness  Catherine Green is a 73 y.o. female patient of [name] status post right CEA in 1992 with a revision in 06.  States her cholesterol is high despite taking Crestor, not having diabetes that she is aware, being physically active, not smoking.  Exercises 30 minutes daily.  Had 3 vessel CABG in 2004.  Patient reports history of TIA or stroke symptom in 1991 as manifested by garbled speech and amaurosis fugax or monocular blindness as manifested by pages of print appearing blank. The patient denies facial drooping.  Pt. denies hemiplegia.   She is currently under treatment for a right ear infection. She is frustrated that she cannot lose weight.   Pt Diabetic: No  Pt smoker: former smoker, quit early 1990's  Pt meds include:  Statin : Yes  Betablocker: Yes  ASA: Yes  Other anticoagulants/antiplatelets: no  Past Medical History  Diagnosis Date  . Hyperlipidemia   . Coronary artery disease   . Hypertension   . PVD (peripheral vascular disease)     endarterectomy by [name]  . Pre-syncope   . Thrombophlebitis     following childbirth  . Cerebrovascular disease     extracranial; occlusive  . Ulcer   . Heart murmur   . Dizziness   . Chicken pox   . Depression   . Fainting spell   . Allergy     hay fever  . Heart murmur   . Anemia   . DVT (deep venous thrombosis)     Social History History  Substance Use Topics  . Smoking status: Former Smoker -- 0.30 packs/day for 15 years    Types: Cigarettes    Quit date: 08/31/1996  . Smokeless tobacco: Never Used  . Alcohol Use: No    Family History Family History  Problem Relation Age of Onset  . Aneurysm Mother     brain  . Heart disease Mother     Aneyursm   . Hyperlipidemia Mother   . Hypertension Mother   . Heart disease Father   . Cirrhosis Father   . Heart attack Father   . Heart disease Brother     Before age 21  . Aneurysm Brother   . Hypertension  Other   . Cancer Sister     lung  . Heart disease Sister     Aneurysm  . Varicose Veins Sister     Surgical History Past Surgical History  Procedure Laterality Date  . Endarterectomy  2006    [name]  . Septoplasty      with bilateral inferior turbinate reductions  . Total abdominal hysterectomy    . Appendectomy    . Carotid endarterectomy  1992  . Eye surgery  Feb. 2013    Cataract Left eye  . Coronary artery bypass graft  2006    x3 Dr. Prescott Gum  . Breast surgery      saline implants  . Cataract extraction, bilateral  2013    Allergies  Allergen Reactions  . Atorvastatin Nausea And Vomiting  . Codeine Nausea And Vomiting  . Demerol Nausea And Vomiting  . Oxycodone Hcl Other (See Comments)    Hallucinations   . Rosuvastatin Other (See Comments)    HIGHER DOSE-Causes Leg cramps    Current Outpatient Prescriptions  Medication Sig Dispense Refill  . ALPRAZolam (XANAX) 0.5 MG tablet TAKE 1 TABLET BY MOUTH AT BEDTIME AS NEEDED FOR SLEEP OR ANXIETY  30  tablet  1  . amoxicillin-clavulanate (AUGMENTIN) 875-125 MG per tablet Take 1 tablet by mouth 2 (two) times daily.  20 tablet  0  . aspirin 81 MG tablet Take 162 mg by mouth daily.       . carvedilol (COREG) 6.25 MG tablet TAKE 1 TABLET BY MOUTH TWICE A DAY WITH FOOD  120 tablet  3  . cyanocobalamin 500 MCG tablet Take 500 mcg by mouth daily.      . furosemide (LASIX) 20 MG tablet Take 1 tablet (20 mg total) by mouth daily as needed for fluid.  90 tablet  3  . hydrochlorothiazide (HYDRODIURIL) 50 MG tablet TAKE 1 TABLET (50 MG TOTAL) BY MOUTH DAILY.  90 tablet  3  . KLOR-CON M10 10 MEQ tablet daily.      . rosuvastatin (CRESTOR) 10 MG tablet Take 5 mg by mouth daily.       No current facility-administered medications for this visit.    Review of Systems : See HPI for pertinent positives and negatives.  Physical Examination  Filed Vitals:   12/26/13 1121  BP: 147/64  Pulse: 54  Resp: 14   Filed Weights    12/26/13 1121  Weight: 161 lb (73.029 kg)   Body mass index is 29.44 kg/(m^2).   General: WDWN female in NAD  GAIT: normal  Eyes: PERRLA  Pulmonary: CTAB, Negative Rales, Negative rhonchi, & Negative wheezing.  Cardiac: regular Rhythm , no detected Murmur.  VASCULAR EXAM  Carotid Bruits  Left  Right    positive  positive  Aorta is not palpable.  Radial pulses are 2+ palpable and equal.  LE Pulses  LEFT  RIGHT   POPLITEAL  not palpable  not palpable   POSTERIOR TIBIAL  palpable  palpable   DORSALIS PEDIS  ANTERIOR TIBIAL  palpable  palpable   Gastrointestinal: soft, nontender, BS WNL, no r/g, negative masses.  Musculoskeletal: Negative muscle atrophy/wasting. M/S 5/5 throughout, Extremities without ischemic changes.  Neurologic: A&O X 3; Appropriate Affect ; SENSATION ;normal;  Speech is normal  CN 2-12 intact, Pain and light touch intact in extremities, Motor exam as listed above.     Non-Invasive Vascular Imaging CAROTID DUPLEX 12/26/2013   CEREBROVASCULAR DUPLEX EVALUATION    INDICATION: Carotid artery disease     PREVIOUS INTERVENTION(S): Right carotid endarterectomy 04/21/1991, revised on 02/13/2005 due to pseudoaneurysm.    DUPLEX EXAM:     RIGHT  LEFT  Peak Systolic Velocities (cm/s) End Diastolic Velocities (cm/s) Plaque LOCATION Peak Systolic Velocities (cm/s) End Diastolic Velocities (cm/s) Plaque  77 17  CCA PROXIMAL 87 14   65 14  CCA MID 88 16 HT  84 18  CCA DISTAL 80 17   63  Occluded proximally 9  ECA 105 6 HT  58 11  ICA PROXIMAL 364 72 HT  84 18  ICA MID 111 18   89 18  ICA DISTAL 109 24      ICA / CCA Ratio (PSV) 4.13  Antegrade  Vertebral Flow Antegrade   161 Brachial Systolic Pressure (mmHg) 096  Triphasic  Brachial Artery Waveforms Triphasic     Plaque Morphology:  HM = Homogeneous, HT = Heterogeneous, CP = Calcific Plaque, SP = Smooth Plaque, IP = Irregular Plaque     ADDITIONAL FINDINGS:     IMPRESSION: Patent right carotid endarterectomy  site with evidence of moderate hyperplasia present in the mid endarterectomy site. Known right external carotid artery proximal occlusion.  Left internal carotid artery velocities  suggest a 60-79% stenosis (high end of range).    Compared to the previous exam:  No significant change in comparison to the last exam on 06/27/2013.      Assessment: ALYSIAH SUPPA is a 73 y.o. female who presents with asymptomatic patent right carotid endarterectomy site with evidence of moderate hyperplasia present in the mid endarterectomy site. Known right external carotid artery proximal occlusion.  Left internal carotid artery velocities suggest a 60-79% stenosis (high end of range). No significant change in comparison to the last exam on 06/27/2013.  Plan: Follow-up in 6 months with Carotid Duplex scan.   I discussed in depth with the patient the nature of atherosclerosis, and emphasized the importance of maximal medical management including strict control of blood pressure, blood glucose, and lipid levels, obtaining regular exercise, and continued cessation of smoking.  The patient is aware that without maximal medical management the underlying atherosclerotic disease process will progress, limiting the benefit of any interventions. The patient was given information about stroke prevention and what symptoms should prompt the patient to seek immediate medical care. Thank you for allowing [place] to participate in this patient's care.  Clemon Chambers, RN, MSN, FNP-C [facility] Office: [phone]  Clinic Physician: Early  12/26/2013 11:42 AM

## 2014-01-08 ENCOUNTER — Ambulatory Visit: Payer: Medicare PPO | Admitting: Internal Medicine

## 2014-01-16 ENCOUNTER — Ambulatory Visit: Payer: Medicare PPO | Admitting: Internal Medicine

## 2014-01-16 ENCOUNTER — Telehealth: Payer: Self-pay | Admitting: Internal Medicine

## 2014-01-16 NOTE — Telephone Encounter (Signed)
Received fax from [facility] back Auth# [id number] Treating Provider Loistine Simas # of Visits 4 Start Date 01/11/14 End Date 04/11/14 CPT 99499 DX 787.24

## 2014-01-16 NOTE — Telephone Encounter (Signed)
Received fax from [place] back Auth# [id number] Treating Provider Dr. Luberta Mutter  # of Visits 4 Start Date 05/15/14 End Date 08/13/14 CPT 99499 DX V43.1, 367.20

## 2014-01-16 NOTE — Telephone Encounter (Signed)
Received fax from [place] back Auth# [id number] Treating Provider # of Visits 4  Start Date 12/28/13 End Date 03/28/14 CPT 99499 DX 780.4

## 2014-01-24 ENCOUNTER — Other Ambulatory Visit: Payer: Self-pay | Admitting: Internal Medicine

## 2014-01-24 ENCOUNTER — Ambulatory Visit: Payer: Self-pay | Admitting: Gastroenterology

## 2014-01-24 NOTE — Telephone Encounter (Signed)
Ok refill? 

## 2014-01-29 ENCOUNTER — Encounter: Payer: Self-pay | Admitting: Internal Medicine

## 2014-02-05 ENCOUNTER — Encounter: Payer: Self-pay | Admitting: Internal Medicine

## 2014-02-05 ENCOUNTER — Ambulatory Visit (INDEPENDENT_AMBULATORY_CARE_PROVIDER_SITE_OTHER): Payer: Commercial Managed Care - HMO | Admitting: Internal Medicine

## 2014-02-05 VITALS — BP 160/62 | HR 54 | Temp 98.2°F | Ht 62.0 in | Wt 159.2 lb

## 2014-02-05 DIAGNOSIS — R1011 Right upper quadrant pain: Secondary | ICD-10-CM

## 2014-02-05 DIAGNOSIS — R1314 Dysphagia, pharyngoesophageal phase: Secondary | ICD-10-CM

## 2014-02-05 LAB — POCT URINALYSIS DIPSTICK
Bilirubin, UA: NEGATIVE
Glucose, UA: NEGATIVE
Ketones, UA: NEGATIVE
Nitrite, UA: NEGATIVE
Protein, UA: NEGATIVE
Spec Grav, UA: 1.02
Urobilinogen, UA: 0.2
pH, UA: 7

## 2014-02-05 LAB — LIPASE: Lipase: 13 U/L (ref 11.0–59.0)

## 2014-02-05 LAB — COMPREHENSIVE METABOLIC PANEL
ALT: 19 U/L (ref 0–35)
AST: 23 U/L (ref 0–37)
Albumin: 4.2 g/dL (ref 3.5–5.2)
Alkaline Phosphatase: 75 U/L (ref 39–117)
BUN: 18 mg/dL (ref 6–23)
CO2: 33 mEq/L — ABNORMAL HIGH (ref 19–32)
Calcium: 9.9 mg/dL (ref 8.4–10.5)
Chloride: 90 mEq/L — ABNORMAL LOW (ref 96–112)
Creatinine, Ser: 0.9 mg/dL (ref 0.4–1.2)
GFR: 68.58 mL/min (ref >60.00)
Glucose, Bld: 103 mg/dL — ABNORMAL HIGH (ref 70–99)
Potassium: 3.2 mEq/L — ABNORMAL LOW (ref 3.5–5.1)
Sodium: 136 mEq/L (ref 135–145)
Total Bilirubin: 0.5 mg/dL (ref 0.2–1.2)
Total Protein: 6.8 g/dL (ref 6.0–8.3)

## 2014-02-05 LAB — CBC WITH DIFFERENTIAL/PLATELET
Basophils Absolute: 0 10*3/uL (ref 0.0–0.1)
Basophils Relative: 0.5 % (ref 0.0–3.0)
Eosinophils Absolute: 0 10*3/uL (ref 0.0–0.7)
Eosinophils Relative: 0.6 % (ref 0.0–5.0)
HCT: 37.8 % (ref 36.0–46.0)
Hemoglobin: 12.5 g/dL (ref 12.0–15.0)
Lymphocytes Relative: 25.2 % (ref 12.0–46.0)
Lymphs Abs: 1.9 10*3/uL (ref 0.7–4.0)
MCHC: 33 g/dL (ref 30.0–36.0)
MCV: 83.6 fl (ref 78.0–100.0)
Monocytes Absolute: 0.5 10*3/uL (ref 0.1–1.0)
Monocytes Relative: 6.2 % (ref 3.0–12.0)
Neutro Abs: 5 10*3/uL (ref 1.4–7.7)
Neutrophils Relative %: 67.5 % (ref 43.0–77.0)
Platelets: 340 10*3/uL (ref 150.0–400.0)
RBC: 4.53 Mil/uL (ref 3.87–5.11)
RDW: 14.2 % (ref 11.5–15.5)
WBC: 7.5 10*3/uL (ref 4.0–10.5)

## 2014-02-05 NOTE — Patient Instructions (Signed)
We will set up ultrasound of the gallbladder today.  Follow up 1 week.

## 2014-02-05 NOTE — Progress Notes (Signed)
Pre visit review using our clinic review tool, if applicable. No additional management support is needed unless otherwise documented below in the visit note. 

## 2014-02-05 NOTE — Assessment & Plan Note (Signed)
Symptoms most consistent with esophageal narrowing. Encouraged her to follow through with EGD as scheduled.

## 2014-02-05 NOTE — Progress Notes (Signed)
Subjective:    Patient ID: Catherine Green, female    DOB: [birth year], 73 y.o.   MRN: [id number]  HPI 73YO female presents for acute visit.  Dysphagia - Having trouble with food becoming caught in her esophagus/mid chest. Bread is worst. Makes her vomit after eating. Scheduled to have upper endoscopy later this week.  Right upper abdominal pain - Also having intermittent right upper abdominal pain. Ongoing for months, but much worse over the last week. Occurs daily, described as severe squeezing pain. Feels nauseous when pain occurs. Improved with rest, no intervention. Lasts a few moments. Feels weak afterward. No fever, chills. No diarrhea, blood in stool, blank stool.  Review of Systems  Constitutional: Negative for fever, chills, appetite change, fatigue and unexpected weight change.  HENT: Positive for trouble swallowing. Negative for congestion, sore throat and voice change.   Eyes: Negative for visual disturbance.  Respiratory: Negative for cough and shortness of breath.   Cardiovascular: Negative for chest pain, palpitations and leg swelling.  Gastrointestinal: Positive for nausea, vomiting and abdominal pain. Negative for diarrhea, constipation, blood in stool, abdominal distention and anal bleeding.  Genitourinary: Negative for dysuria and flank pain.  Musculoskeletal: Negative for arthralgias, gait problem, myalgias and neck pain.  Skin: Negative for color change and rash.  Neurological: Negative for dizziness and headaches.  Hematological: Negative for adenopathy. Does not bruise/bleed easily.       Objective:    BP 160/62  Pulse 54  Temp(Src) 98.2 F (36.8 C) (Oral)  Ht 5\' 2"  (1.575 m)  Wt 159 lb 4 oz (72.235 kg)  BMI 29.12 kg/m2  SpO2 97% Physical Exam  Constitutional: She is oriented to person, place, and time. She appears well-developed and well-nourished. No distress.  HENT:  Head: Normocephalic and atraumatic.  Right Ear: External ear normal.  Left Ear:  External ear normal.  Nose: Nose normal.  Mouth/Throat: Oropharynx is clear and moist. No oropharyngeal exudate.  Eyes: Conjunctivae are normal. Pupils are equal, round, and reactive to light. Right eye exhibits no discharge. Left eye exhibits no discharge. No scleral icterus.  Neck: Normal range of motion. Neck supple. No tracheal deviation present. No thyromegaly present.  Cardiovascular: Normal rate, regular rhythm, normal heart sounds and intact distal pulses.  Exam reveals no gallop and no friction rub.   No murmur heard. Pulmonary/Chest: Effort normal and breath sounds normal. No accessory muscle usage. Not tachypneic. No respiratory distress. She has no decreased breath sounds. She has no wheezes. She has no rhonchi. She has no rales. She exhibits no tenderness.  Abdominal: Soft. Bowel sounds are normal. She exhibits no distension and no mass. There is tenderness (right upper abdomen). There is no rebound and no guarding.  Musculoskeletal: Normal range of motion. She exhibits no edema and no tenderness.  Lymphadenopathy:    She has no cervical adenopathy.  Neurological: She is alert and oriented to person, place, and time. No cranial nerve deficit. She exhibits normal muscle tone. Coordination normal.  Skin: Skin is warm and dry. No rash noted. She is not diaphoretic. No erythema. No pallor.  Psychiatric: She has a normal mood and affect. Her behavior is normal. Judgment and thought content normal.          Assessment & Plan:   Problem List Items Addressed This Visit     Unprioritized   Abdominal pain, right upper quadrant - Primary     Severe RUQ abdominal pain concerning for acute cholecystitis. Will check CBC and CMP  with labs. Will get stat RUQ [place] for evaluation.    Relevant Orders      US Abdomen Limited RUQ      CBC w/Diff      Comprehensive metabolic panel      Lipase      POCT Urinalysis Dipstick (Completed)      Urine culture   Dysphagia, pharyngoesophageal phase       Symptoms most consistent with esophageal narrowing. Encouraged her to follow through with EGD as scheduled.    Relevant Orders      Urine culture       Return in about 1 week (around 02/12/2014).

## 2014-02-05 NOTE — Assessment & Plan Note (Signed)
Severe RUQ abdominal pain concerning for acute cholecystitis. Will check CBC and CMP with labs. Will get stat RUQ [place] for evaluation.

## 2014-02-06 ENCOUNTER — Ambulatory Visit: Payer: Self-pay | Admitting: Internal Medicine

## 2014-02-06 ENCOUNTER — Telehealth: Payer: Self-pay | Admitting: Internal Medicine

## 2014-02-06 LAB — URINE CULTURE: Colony Count: >=100000

## 2014-02-06 NOTE — Telephone Encounter (Signed)
Ultrasound of the abdomen showed polyps in the gallbladder. No signs of inflammation were seen. I would recommend that we get a HIDA scan for further evaluation of function of the gallbladder. Please let me know if pt wants to proceed with this.

## 2014-02-07 NOTE — Telephone Encounter (Signed)
Pt stated that she is already scheduled for this test

## 2014-02-09 ENCOUNTER — Telehealth: Payer: Self-pay | Admitting: *Deleted

## 2014-02-09 ENCOUNTER — Ambulatory Visit: Payer: Self-pay | Admitting: Gastroenterology

## 2014-02-09 NOTE — Telephone Encounter (Signed)
I only ordered one ultrasound on her?

## 2014-02-09 NOTE — Telephone Encounter (Signed)
Rhonda from [facility] called stating that an ultrasound was completed on the 02/06/14 and pt also has another [place] ordered for 02/13/14, they are requesting for verification of what this 2nd [place] was sent

## 2014-02-13 ENCOUNTER — Ambulatory Visit: Payer: Self-pay | Admitting: Internal Medicine

## 2014-02-14 ENCOUNTER — Ambulatory Visit (INDEPENDENT_AMBULATORY_CARE_PROVIDER_SITE_OTHER): Payer: Commercial Managed Care - HMO | Admitting: Adult Health

## 2014-02-14 ENCOUNTER — Encounter: Payer: Self-pay | Admitting: Adult Health

## 2014-02-14 VITALS — BP 140/78 | HR 53 | Temp 98.0°F | Resp 14 | Ht 62.0 in | Wt 159.0 lb

## 2014-02-14 DIAGNOSIS — R1011 Right upper quadrant pain: Secondary | ICD-10-CM

## 2014-02-14 DIAGNOSIS — K824 Cholesterolosis of gallbladder: Secondary | ICD-10-CM

## 2014-02-14 LAB — PATHOLOGY REPORT

## 2014-02-14 NOTE — Progress Notes (Signed)
Pre visit review using our clinic review tool, if applicable. No additional management support is needed unless otherwise documented below in the visit note. 

## 2014-02-14 NOTE — Progress Notes (Signed)
Patient ID: Catherine Green, female   DOB: 31-Aug-1941, 73 y.o.   MRN: [id number]   Subjective:    Patient ID: Catherine Green, female    DOB: 1941/02/01, 73 y.o.   MRN: [id number]  HPI  Patient is a pleasant 73 year old female who presents to clinic with ongoing right upper quadrant pain. Patient was seen in clinic on 02/05/2014 and is status post ultrasound of right upper quadrant which revealed polyps in the gallbladder. Patient reports that her symptoms are intermittent but severe when they occur. Her symptoms became so severe this past Sunday she felt that she may need to go to the emergency room. Reported pain was 10/10.  She waited and symptoms subsided so she did not go to the emergency room. She reports that her symptoms are worsened with meals. Pain radiates to her back. She is unable to lie on her right side. When she lies on her left side she becomes significantly nauseated. Denies fever, chills, diarrhea or bloody stools.    Past Medical History  Diagnosis Date  . Hyperlipidemia   . Coronary artery disease   . Hypertension   . PVD (peripheral vascular disease)     endarterectomy by [name]  . Pre-syncope   . Thrombophlebitis     following childbirth  . Cerebrovascular disease     extracranial; occlusive  . Ulcer   . Heart murmur   . Dizziness   . Chicken pox   . Depression   . Fainting spell   . Allergy     hay fever  . Heart murmur   . Anemia   . DVT (deep venous thrombosis)     Current Outpatient Prescriptions on File Prior to Visit  Medication Sig Dispense Refill  . ALPRAZolam (XANAX) 0.5 MG tablet TAKE 1 TABLET BY MOUTH AT BEDTIME AS NEEDED FOR SLEEP OR ANXIETY  30 tablet  1  . aspirin 81 MG tablet Take 162 mg by mouth daily.       . carvedilol (COREG) 6.25 MG tablet TAKE 1 TABLET BY MOUTH TWICE A DAY WITH FOOD  120 tablet  3  . furosemide (LASIX) 20 MG tablet Take 1 tablet (20 mg total) by mouth daily as needed for fluid.  90 tablet  3  . hydrochlorothiazide  (HYDRODIURIL) 50 MG tablet TAKE 1 TABLET (50 MG TOTAL) BY MOUTH DAILY.  90 tablet  3  . KLOR-CON M10 10 MEQ tablet daily.      . rosuvastatin (CRESTOR) 10 MG tablet Take 5 mg by mouth daily.       No current facility-administered medications on file prior to visit.     Review of Systems  Constitutional: Positive for appetite change and fatigue.  Respiratory: Negative.   Cardiovascular: Negative.   Gastrointestinal: Positive for nausea and abdominal pain. Negative for diarrhea, constipation and blood in stool.  Musculoskeletal: Negative.   Neurological: Positive for weakness.  Psychiatric/Behavioral: Negative.   All other systems reviewed and are negative.      Objective:  BP 140/78  Pulse 53  Temp(Src) 98 F (36.7 C) (Oral)  Resp 14  Ht 5\' 2"  (1.575 m)  Wt 159 lb (72.122 kg)  BMI 29.07 kg/m2  SpO2 99%   Physical Exam  Constitutional: She is oriented to person, place, and time.  Patient appears extremely uncomfortable during office visit. She is guarding and holding her right side  Cardiovascular: Normal rate and regular rhythm.   Pulmonary/Chest: Effort normal. No respiratory distress.  Abdominal: There  is tenderness. There is guarding.  Musculoskeletal: Normal range of motion. She exhibits no edema.  Neurological: She is alert and oriented to person, place, and time.  Skin: Skin is warm and dry.  Psychiatric: She has a normal mood and affect. Her behavior is normal. Judgment and thought content normal.   CBC Latest Ref Rng 02/05/2014 12/25/2013 04/11/2012  WBC 4.0 - 10.5 K/uL 7.5 7.9 6.2  Hemoglobin 12.0 - 15.0 g/dL 12.5 12.7 12.4  Hematocrit 36.0 - 46.0 % 37.8 38.4 37.5  Platelets 150.0 - 400.0 K/uL 340.0 365.0 307.0     Chemistry      Component Value Date/Time   NA 136 02/05/2014 0951   NA 138 05/08/2013 0936   K 3.2* 02/05/2014 0951   CL 90* 02/05/2014 0951   CO2 33* 02/05/2014 0951   BUN 18 02/05/2014 0951   BUN 14 05/08/2013 0936   CREATININE 0.9 02/05/2014 0951        Component Value Date/Time   CALCIUM 9.9 02/05/2014 0951   ALKPHOS 75 02/05/2014 0951   AST 23 02/05/2014 0951   ALT 19 02/05/2014 0951   BILITOT 0.5 02/05/2014 0951          Assessment & Plan:   1. Polyp of gallbladder Symptoms of RUQ pain have been ongoing for greater than one year. Given recent finding of polyps within the gallbladder and the patient reports that she can no longer continue in this way, I am referring her for evaluation by general surgery. She is requesting that Dr. Tamala Julian in [place]. We will certainly try to get her in to be seen by him.   - Ambulatory referral to General Surgery  2. RUQ abdominal pain Worse with meals. Patient is unable to lie on her right side or her left side. Significant pain related 10/10. Nauseated. Patient appears very uncomfortable during the visit. - Ambulatory referral to General Surgery

## 2014-02-23 ENCOUNTER — Ambulatory Visit: Payer: Medicare PPO | Admitting: Cardiovascular Disease

## 2014-02-23 ENCOUNTER — Encounter: Payer: Self-pay | Admitting: Internal Medicine

## 2014-03-07 ENCOUNTER — Encounter: Payer: Self-pay | Admitting: Cardiovascular Disease

## 2014-03-07 ENCOUNTER — Ambulatory Visit (INDEPENDENT_AMBULATORY_CARE_PROVIDER_SITE_OTHER): Payer: Commercial Managed Care - HMO | Admitting: Cardiovascular Disease

## 2014-03-07 VITALS — BP 140/72 | HR 61 | Ht 62.0 in | Wt 160.0 lb

## 2014-03-07 DIAGNOSIS — Z01810 Encounter for preprocedural cardiovascular examination: Secondary | ICD-10-CM | POA: Insufficient documentation

## 2014-03-07 DIAGNOSIS — I5033 Acute on chronic diastolic (congestive) heart failure: Secondary | ICD-10-CM

## 2014-03-07 DIAGNOSIS — I6529 Occlusion and stenosis of unspecified carotid artery: Secondary | ICD-10-CM

## 2014-03-07 DIAGNOSIS — I10 Essential (primary) hypertension: Secondary | ICD-10-CM

## 2014-03-07 DIAGNOSIS — E785 Hyperlipidemia, unspecified: Secondary | ICD-10-CM

## 2014-03-07 DIAGNOSIS — I509 Heart failure, unspecified: Secondary | ICD-10-CM

## 2014-03-07 DIAGNOSIS — I25810 Atherosclerosis of coronary artery bypass graft(s) without angina pectoris: Secondary | ICD-10-CM

## 2014-03-07 NOTE — Progress Notes (Signed)
Patient ID: CHOLE DRIVER, female    DOB: [birth year], 73 y.o.   MRN: [id number]  HPI Comments: Catherine Green is a very pleasant 73 year old woman with history coronary artery disease, bypass surgery, peripheral vascular disease with history of right carotid endarterectomy followed by [name], who presents for routine followup. Last catheterization in January 2009 showing patent vein grafts, atretic LIMA to the LAD.    Carotid ultrasound shows 50-70% disease on the left.  Previous symptoms of shortness of breath and weight gain. Improved with Lasix to take as needed. She reports drinking significant amount of fluids daily as she was told to drink 6-8 glasses of extra water.   She denies significant leg edema, no significant chest pain or shortness of breath with exertion. Overall she feels well apart from abdominal pain after she eats. She reports having polyps in her gallbladder. She is scheduled to have gallbladder surgery next week with Dr. Tamala Julian. Anesthesia preop tomorrow  In the past, Carvedilol was decreased secondary to fatigue, dizziness.  She was taking one half pill twice a day, now reports she is taking one half pill once a day in the evening.   On previous visits, she had "hair loss" on simvastatin, as well as sores on her head. Lipitor caused leg problems/myalgias.  She is not particularly interested in fenofibrate, WelChol. Too many pills.  She has had problems with zetia.  Tolerating low-dose Crestor  Previously stopped her "nerve" pill. Significant stressors as husband is sick. She was previously using the treadmill on a regular basis    EKG shows normal sinus rhythm with rate of 61 beats per minute, nonspecific ST abnormality, no significant change from prior EKGs        Outpatient Encounter Prescriptions as of 03/07/2014  Medication Sig  . ALPRAZolam (XANAX) 0.5 MG tablet TAKE 1 TABLET BY MOUTH AT BEDTIME AS NEEDED FOR SLEEP OR ANXIETY  . aspirin 81 MG tablet Take  162 mg by mouth daily.   . carvedilol (COREG) 6.25 MG tablet TAKE 1 TABLET BY MOUTH TWICE A DAY WITH FOOD  . furosemide (LASIX) 20 MG tablet Take 1 tablet (20 mg total) by mouth daily as needed for fluid.  . hydrochlorothiazide (HYDRODIURIL) 50 MG tablet TAKE 1 TABLET (50 MG TOTAL) BY MOUTH DAILY.  Marland Kitchen KLOR-CON M10 10 MEQ tablet Take 10 mEq by mouth.   . pantoprazole (PROTONIX) 20 MG tablet Take 20 mg by mouth daily.  . rosuvastatin (CRESTOR) 10 MG tablet Take 10 mg by mouth daily.     Review of Systems  Constitutional: Positive for fatigue.  HENT: Negative.   Eyes: Negative.   Respiratory: Positive for shortness of breath.   Cardiovascular: Positive for palpitations.  Gastrointestinal: Negative.   Endocrine: Negative.   Musculoskeletal: Negative.   Skin: Negative.   Allergic/Immunologic: Negative.   Neurological: Negative.   Hematological: Negative.   Psychiatric/Behavioral: Negative.   All other systems reviewed and are negative.   BP 140/72  Pulse 61  Ht 5\' 2"  (1.575 m)  Wt 160 lb (72.576 kg)  BMI 29.26 kg/m2  Physical Exam  Nursing note and vitals reviewed. Constitutional: She is oriented to person, place, and time. She appears well-developed and well-nourished.  HENT:  Head: Normocephalic.  Nose: Nose normal.  Mouth/Throat: Oropharynx is clear and moist.  Eyes: Conjunctivae are normal. Pupils are equal, round, and reactive to light.  Neck: Normal range of motion. Neck supple. No JVD present.  Cardiovascular: Normal rate, regular rhythm, S1  normal, S2 normal, normal heart sounds and intact distal pulses.  Exam reveals no gallop and no friction rub.   No murmur heard. Pulmonary/Chest: Effort normal and breath sounds normal. No respiratory distress. She has no wheezes. She has no rales. She exhibits no tenderness.  Abdominal: Soft. Bowel sounds are normal. She exhibits no distension. There is no tenderness.  Musculoskeletal: Normal range of motion. She exhibits no edema  and no tenderness.  Lymphadenopathy:    She has no cervical adenopathy.  Neurological: She is alert and oriented to person, place, and time. Coordination normal.  Skin: Skin is warm and dry. No rash noted. No erythema.  Psychiatric: She has a normal mood and affect. Her behavior is normal. Judgment and thought content normal.    Assessment and Plan

## 2014-03-07 NOTE — Assessment & Plan Note (Signed)
Followed in [place]. She has 60-70% left carotid arterial disease in 2014

## 2014-03-07 NOTE — Assessment & Plan Note (Addendum)
She is scheduled for gallbladder surgery next week, anesthesia preop tomorrow. EKG is unchanged, no symptoms concerning for angina. We have recommended she stay on her aspirin 81 mg daily given underlying carotid disease, coronary disease. Would recommend minimal IV fluids during her surgical period and recovery given history of diastolic CHF in the past.

## 2014-03-07 NOTE — Assessment & Plan Note (Signed)
No signs of heart failure. He is relatively euvolemic. She takes HCTZ daily, Lasix only as needed.

## 2014-03-07 NOTE — Patient Instructions (Signed)
You are doing well. No medication changes were made.  Please call us if you have new issues that need to be addressed before your next appt.  Your physician wants you to follow-up in: 6 months.  You will receive a reminder letter in the mail two months in advance. If you don't receive a letter, please call our office to schedule the follow-up appointment.   

## 2014-03-07 NOTE — Assessment & Plan Note (Signed)
Currently with no symptoms of angina. No further workup at this time. Continue current medication regimen. 

## 2014-03-07 NOTE — Assessment & Plan Note (Signed)
Blood pressure is well controlled on today's visit. No changes made to the medications. 

## 2014-03-07 NOTE — Assessment & Plan Note (Signed)
She is tolerating Crestor 10 mg daily. Difficult to advance her statin given previous myalgias

## 2014-03-08 ENCOUNTER — Ambulatory Visit: Payer: Self-pay | Admitting: Surgery

## 2014-03-15 ENCOUNTER — Ambulatory Visit: Payer: Self-pay | Admitting: Surgery

## 2014-03-19 LAB — PATHOLOGY REPORT

## 2014-03-27 ENCOUNTER — Ambulatory Visit: Payer: Self-pay | Admitting: Family Medicine

## 2014-04-02 HISTORY — PX: CHOLECYSTECTOMY, LAPAROSCOPIC: SHX56

## 2014-04-09 ENCOUNTER — Encounter: Payer: Self-pay | Admitting: Adult Health

## 2014-04-19 ENCOUNTER — Other Ambulatory Visit: Payer: Self-pay | Admitting: Cardiovascular Disease

## 2014-04-30 ENCOUNTER — Telehealth: Payer: Self-pay | Admitting: *Deleted

## 2014-04-30 NOTE — Telephone Encounter (Signed)
I don't see what her indication for an abx is.

## 2014-04-30 NOTE — Telephone Encounter (Signed)
Patient called and needs an antibiotic called in for dental work on 05/22/14 @ 9:45. Please call her when it has been called in. Thanks

## 2014-05-01 NOTE — Telephone Encounter (Signed)
Left message for pt to call back  °

## 2014-05-01 NOTE — Telephone Encounter (Signed)
Spoke w/ pt.  Advised her of Chris's recommendation.  She is agreeable and will call back w/ any questions or concerns.

## 2014-05-23 ENCOUNTER — Telehealth: Payer: Self-pay

## 2014-05-23 NOTE — Telephone Encounter (Signed)
Pt states she is having palpitations, states not sure if it is panic attacks or not, states "not much of anything as far as pain" States she had an episode this morning when she went to get her nails done, and has "zigzag things in her eyes", a little SOB

## 2014-05-23 NOTE — Telephone Encounter (Signed)
Spoke w/ pt.  She reports that she was getting her nails done at the salon and when she stood up to pick our her nail color, she experienced "zigzags in my eyes and a bright light" that lasted only a second or two.  She reports that episodes of palpitations are increasing frequency and they are starting to scare her, as they occur mostly at night.  Pt states that she is unsure if sx are panic attacks, as her husband is very sick and she is is sole caregiver.  Pt sched to see Dr. Rockey Situ Friday at 8:15, but she would like to know if there is anything that can be done in the meantime.  Please advise.  Thank you.

## 2014-05-23 NOTE — Telephone Encounter (Signed)
Uncertain what that disease would be May be unrelated to any cardiac issue Certainly a Holter monitor could be done if there is concern of arrhythmia This would not be back until next week even if she wore this starting on Thursday We can probably discuss this week

## 2014-05-24 NOTE — Telephone Encounter (Signed)
Spoke w/ pt.  Advised her of Dr. Donivan Scull recommendation.  Pt reports that she is very weak and has increasing SOB.  She is not happy w/ her current PCP and is sched to see Dr. Damita Dunnings next month to establish care.  Pt will keep her appt w/ Dr. Rockey Situ tomorrow to discuss sx.

## 2014-05-25 ENCOUNTER — Ambulatory Visit (INDEPENDENT_AMBULATORY_CARE_PROVIDER_SITE_OTHER): Payer: Commercial Managed Care - HMO | Admitting: Cardiovascular Disease

## 2014-05-25 ENCOUNTER — Encounter: Payer: Self-pay | Admitting: Cardiovascular Disease

## 2014-05-25 VITALS — BP 154/75 | HR 60 | Ht 62.0 in | Wt 160.0 lb

## 2014-05-25 DIAGNOSIS — I25810 Atherosclerosis of coronary artery bypass graft(s) without angina pectoris: Secondary | ICD-10-CM

## 2014-05-25 DIAGNOSIS — F419 Anxiety disorder, unspecified: Secondary | ICD-10-CM

## 2014-05-25 DIAGNOSIS — I509 Heart failure, unspecified: Secondary | ICD-10-CM

## 2014-05-25 DIAGNOSIS — I6529 Occlusion and stenosis of unspecified carotid artery: Secondary | ICD-10-CM

## 2014-05-25 DIAGNOSIS — E785 Hyperlipidemia, unspecified: Secondary | ICD-10-CM

## 2014-05-25 DIAGNOSIS — I10 Essential (primary) hypertension: Secondary | ICD-10-CM

## 2014-05-25 DIAGNOSIS — I5033 Acute on chronic diastolic (congestive) heart failure: Secondary | ICD-10-CM

## 2014-05-25 DIAGNOSIS — R079 Chest pain, unspecified: Secondary | ICD-10-CM

## 2014-05-25 DIAGNOSIS — F411 Generalized anxiety disorder: Secondary | ICD-10-CM

## 2014-05-25 DIAGNOSIS — R002 Palpitations: Secondary | ICD-10-CM

## 2014-05-25 NOTE — Assessment & Plan Note (Signed)
No further workup at this time. Continue current medication regimen. Recent symptoms of abdominal pain chest pain concerning for recent postsurgical recovery, unable to exclude anxiety. If symptoms persist, we would order a stress test

## 2014-05-25 NOTE — Assessment & Plan Note (Signed)
She appears relatively euvolemic on today's visit. Encouraged her to continue Lasix as needed for leg swelling

## 2014-05-25 NOTE — Assessment & Plan Note (Signed)
Atypical type symptoms today with abdominal pain, chest pain, shortness of breath, anxiety. Recommended she consider taking her Xanax as needed

## 2014-05-25 NOTE — Assessment & Plan Note (Signed)
Blood pressure running high. She has not taken her medications yet. Encouraged her to watch her blood pressure at home

## 2014-05-25 NOTE — Assessment & Plan Note (Signed)
Encouraged her to stay on her Crestor every other day as tolerated

## 2014-05-25 NOTE — Patient Instructions (Signed)
You are doing well. No medication changes were made.  Please call if you have worsening chest pain  Please call us if you have new issues that need to be addressed before your next appt.  Your physician wants you to follow-up in: 6 months.  You will receive a reminder letter in the mail two months in advance. If you don't receive a letter, please call our office to schedule the follow-up appointment.   

## 2014-05-25 NOTE — Progress Notes (Signed)
Patient ID: Catherine Green, female    DOB: 1941-07-19, 73 y.o.   MRN: [id number]  HPI Comments: Catherine Green is a very pleasant 73 year old woman with history coronary artery disease, bypass surgery, peripheral vascular disease with history of right carotid endarterectomy followed by [name], who presents for routine followup. Last catheterization in January 2009 showing patent vein grafts, atretic LIMA to the LAD.    Carotid ultrasound shows 50-70% disease on the left. She has had problems with anxiety in the past She presents for routine followup  Previous symptoms of shortness of breath and weight gain. Improved with Lasix to take as needed. She reports drinking significant amount of fluids daily   In followup today, she reports having palpitations, but sensation in her abdomen radiating up to her chest. She is concerned about a cardiac issue. She had her gallbladder taken out 03/13/2014. Also reports that she's not sleeping well. She feels tired in the daytime. Also has shortness of breath when she walks on the treadmill. Some question of urinary tract infection that she was told was a contaminant  She denies significant leg edema, no significant chest pain or shortness of breath with exertion.  On previous visits, she had "hair loss" on simvastatin, as well as sores on her head. Lipitor caused leg problems/myalgias.  She is not particularly interested in fenofibrate, WelChol. Too many pills.  She has had problems with zetia.  Tolerating low-dose Crestor  Previously stopped her "nerve" pill. Significant stressors as husband is sick. She was previously using the treadmill on a regular basis    EKG shows normal sinus rhythm with rate of 60 beats per minute, nonspecific ST abnormality, no significant change from prior EKGs        Outpatient Encounter Prescriptions as of 05/25/2014  Medication Sig  . ALPRAZolam (XANAX) 0.5 MG tablet TAKE 1 TABLET BY MOUTH AT BEDTIME AS NEEDED FOR  SLEEP OR ANXIETY  . aspirin 81 MG tablet Take 162 mg by mouth daily.   . carvedilol (COREG) 6.25 MG tablet TAKE 1 TABLET BY MOUTH TWICE A DAY WITH FOOD  . furosemide (LASIX) 20 MG tablet Take 1 tablet (20 mg total) by mouth daily as needed for fluid.  . hydrochlorothiazide (HYDRODIURIL) 50 MG tablet TAKE 1 TABLET (50 MG TOTAL) BY MOUTH DAILY.  Marland Kitchen KLOR-CON M10 10 MEQ tablet Take 10 mEq by mouth every other day.   . pantoprazole (PROTONIX) 20 MG tablet Take 20 mg by mouth daily.  . rosuvastatin (CRESTOR) 10 MG tablet Take 10 mg by mouth daily.     Review of Systems  Constitutional: Positive for fatigue.  HENT: Negative.   Eyes: Negative.   Respiratory: Positive for shortness of breath.   Cardiovascular: Positive for palpitations.  Gastrointestinal: Negative.   Endocrine: Negative.   Musculoskeletal: Negative.   Skin: Negative.   Allergic/Immunologic: Negative.   Neurological: Negative.   Hematological: Negative.   Psychiatric/Behavioral: Negative.   All other systems reviewed and are negative.   BP 154/75  Pulse 60  Ht 5\' 2"  (1.575 m)  Wt 160 lb (72.576 kg)  BMI 29.26 kg/m2  Physical Exam  Nursing note and vitals reviewed. Constitutional: She is oriented to person, place, and time. She appears well-developed and well-nourished.  HENT:  Head: Normocephalic.  Nose: Nose normal.  Mouth/Throat: Oropharynx is clear and moist.  Eyes: Conjunctivae are normal. Pupils are equal, round, and reactive to light.  Neck: Normal range of motion. Neck supple. No JVD present.  Cardiovascular: Normal rate, regular rhythm, S1 normal, S2 normal, normal heart sounds and intact distal pulses.  Exam reveals no gallop and no friction rub.   No murmur heard. Pulmonary/Chest: Effort normal and breath sounds normal. No respiratory distress. She has no wheezes. She has no rales. She exhibits no tenderness.  Abdominal: Soft. Bowel sounds are normal. She exhibits no distension. There is no tenderness.   Musculoskeletal: Normal range of motion. She exhibits no edema and no tenderness.  Lymphadenopathy:    She has no cervical adenopathy.  Neurological: She is alert and oriented to person, place, and time. Coordination normal.  Skin: Skin is warm and dry. No rash noted. No erythema.  Psychiatric: She has a normal mood and affect. Her behavior is normal. Judgment and thought content normal.    Assessment and Plan

## 2014-05-25 NOTE — Assessment & Plan Note (Signed)
Long history of palpitations and anxiety. No changes to her medications today

## 2014-05-25 NOTE — Assessment & Plan Note (Signed)
Followed in [place]. She has 60-70% left carotid arterial disease We'll continue aggressive cholesterol management

## 2014-06-14 ENCOUNTER — Ambulatory Visit (INDEPENDENT_AMBULATORY_CARE_PROVIDER_SITE_OTHER): Payer: Commercial Managed Care - HMO | Admitting: Family Medicine

## 2014-06-14 ENCOUNTER — Encounter: Payer: Self-pay | Admitting: Family Medicine

## 2014-06-14 VITALS — BP 124/72 | HR 58 | Temp 97.9°F | Wt 159.5 lb

## 2014-06-14 DIAGNOSIS — F419 Anxiety disorder, unspecified: Secondary | ICD-10-CM

## 2014-06-14 DIAGNOSIS — Z23 Encounter for immunization: Secondary | ICD-10-CM

## 2014-06-14 MED ORDER — TRAZODONE HCL 50 MG PO TABS: 25.0000 mg | ORAL_TABLET | Freq: Every day | ORAL | Status: DC

## 2014-06-14 NOTE — Patient Instructions (Addendum)
Stop the xanax and protonix for now.  Try taking trazodone at night, a half to a whole pill and see if that helps with anxiety and sleep.  Update me in about a week, sooner if needed.  Take care.  Glad to see you.

## 2014-06-14 NOTE — Progress Notes (Signed)
Pre visit review using our clinic review tool, if applicable. No additional management support is needed unless otherwise documented below in the visit note.  Transfer patient. Prev records reviewed. Anxiety and palpitations, insomnia.  Had seen cards, thought sx to be related to anxiety.  Xanax 0.25mg  makes her drowsy the next day.  Stressor with her husband's illness, constant worry about him.  Prev had been on clorazepate per Dr. Hardin Negus, w/o relief.  No SI/HI.    H/o GI upset, she hasn't done well on protonix, she didn't know if taking the protonix was making it worse.  Had seen Dr. Jaquita Folds with GI.  Unclear if anxiety was contributing to GI sx.  No vomiting blood, no blood in stool.    Meds, vitals, and allergies reviewed.   ROS: See HPI.  Otherwise, noncontributory.  GEN: nad, alert and oriented, anxious appearing HEENT: mucous membranes moist NECK: supple w/o LA, bruit noted B CV: rrr.  Murmur noted.  PULM: ctab, no inc wob ABD: soft, +bs EXT: no edema SKIN: no acute rash

## 2014-06-15 NOTE — Addendum Note (Signed)
Addended by: Lurlean Nanny on: 06/15/2014 02:37 PM   Modules accepted: Orders

## 2014-06-15 NOTE — Assessment & Plan Note (Signed)
Didn't tolerate xanax. Allergy list clarified in general, re: IV dye and shellfish.   D/w pt about options.  Would start trazodone for sleep at night, will update me as needed. It may help her anxiety.  Okay for outpatient f/u.  >25 minutes spent in face to face time with patient, >50% spent in counselling or coordination of care.

## 2014-06-22 ENCOUNTER — Other Ambulatory Visit: Payer: Self-pay | Admitting: *Deleted

## 2014-06-22 DIAGNOSIS — Z48812 Encounter for surgical aftercare following surgery on the circulatory system: Secondary | ICD-10-CM

## 2014-06-22 DIAGNOSIS — I6523 Occlusion and stenosis of bilateral carotid arteries: Secondary | ICD-10-CM

## 2014-06-29 ENCOUNTER — Encounter: Payer: Self-pay | Admitting: Family Medicine

## 2014-06-29 ENCOUNTER — Ambulatory Visit (INDEPENDENT_AMBULATORY_CARE_PROVIDER_SITE_OTHER): Payer: Commercial Managed Care - HMO | Admitting: Family Medicine

## 2014-06-29 VITALS — BP 122/62 | HR 67 | Temp 98.0°F | Wt 159.5 lb

## 2014-06-29 DIAGNOSIS — R1013 Epigastric pain: Secondary | ICD-10-CM

## 2014-06-29 MED ORDER — SUCRALFATE 1 GM/10ML PO SUSP: 1.0000 g | Freq: Three times a day (TID) | ORAL | Status: DC

## 2014-06-29 MED ORDER — RANITIDINE HCL 150 MG PO TABS: 150.0000 mg | ORAL_TABLET | Freq: Two times a day (BID) | ORAL | Status: DC | PRN

## 2014-06-29 NOTE — Progress Notes (Signed)
Pre visit review using our clinic review tool, if applicable. No additional management support is needed unless otherwise documented below in the visit note.  Trazodone helps with sleep.  She is less fatigued off the xanax.  She has more energy overall now.  Her anxiety is better now.    GI upset.  GB removed 03/2014.  Still with sig nausea.  She stopped the PPI and didn't improve, so that medicine wasn't causing the issue- it didn't fix it either.  No vomiting.  No blood in stool.  No abd pain usually, mild when it happens, upper abd, near the epigastric area. No ibuprofen.  Only 162mg  aspirin a day.  She prev had some relief in the distant past with zantac.    Meds, vitals, and allergies reviewed.   ROS: See HPI.  Otherwise, noncontributory.  GEN: nad, alert and oriented HEENT: mucous membranes moist NECK: supple w/o LA CV: rrr.   PULM: ctab, no inc wob ABD: soft, +bs, epigastrum mildly ttp but no rebound EXT: no edema SKIN: no acute rash

## 2014-06-29 NOTE — Patient Instructions (Signed)
Try zantac 150mg  1-2 times a day.  If not better, then try carafate.   If still not better, then notify me. Take care. Glad to see you.

## 2014-07-01 DIAGNOSIS — R1013 Epigastric pain: Secondary | ICD-10-CM | POA: Insufficient documentation

## 2014-07-01 NOTE — Assessment & Plan Note (Signed)
This may all be gastric irritation.  She'll tyy zantac 150mg  1-2 times a day. If not better, then try carafate.  If still not better, then she'll notify me.  Okay for outpatient f/u.

## 2014-07-02 ENCOUNTER — Encounter: Payer: Self-pay | Admitting: Family

## 2014-07-03 ENCOUNTER — Other Ambulatory Visit: Payer: Self-pay

## 2014-07-03 ENCOUNTER — Other Ambulatory Visit: Payer: Self-pay | Admitting: Vascular Surgery

## 2014-07-03 ENCOUNTER — Ambulatory Visit (INDEPENDENT_AMBULATORY_CARE_PROVIDER_SITE_OTHER): Payer: Commercial Managed Care - HMO | Admitting: Family

## 2014-07-03 ENCOUNTER — Encounter: Payer: Self-pay | Admitting: Family

## 2014-07-03 ENCOUNTER — Ambulatory Visit (HOSPITAL_COMMUNITY)
Admission: RE | Admit: 2014-07-03 | Discharge: 2014-07-03 | Disposition: A | Discharge status: Home / Self Care | Payer: Commercial Managed Care - HMO | Source: Ambulatory Visit | Attending: Family | Admitting: Family

## 2014-07-03 VITALS — BP 144/61 | HR 50 | Resp 14 | Ht 62.0 in | Wt 159.0 lb

## 2014-07-03 DIAGNOSIS — Z48812 Encounter for surgical aftercare following surgery on the circulatory system: Secondary | ICD-10-CM

## 2014-07-03 DIAGNOSIS — I6523 Occlusion and stenosis of bilateral carotid arteries: Secondary | ICD-10-CM | POA: Diagnosis present

## 2014-07-03 DIAGNOSIS — I6529 Occlusion and stenosis of unspecified carotid artery: Secondary | ICD-10-CM | POA: Insufficient documentation

## 2014-07-03 DIAGNOSIS — I6521 Occlusion and stenosis of right carotid artery: Secondary | ICD-10-CM

## 2014-07-03 NOTE — Progress Notes (Signed)
Established Carotid Patient   History of Present Illness  Catherine Green is a 73 y.o. female patient of [name] who is status post right CEA in 1992 with a revision in 2006.  Her cholesterol is high despite taking Crestor, does not have diabetes that she is aware, is physically active, does not smoke.  Exercises 30 minutes daily.  Had 3 vessel CABG in 2004.  Patient reports history of TIA or stroke symptom in 1991 as manifested by garbled speech and amaurosis fugax or monocular blindness as manifested by pages of print appearing blank. The patient denies facial drooping.  Pt. denies hemiplegia. She denies any further TIA or stroke symptoms since 1991. The patient denies tingling, numbness, or weakness in either hand or arm.  In the last 6 months she had her GB removed and benign polyps removed in her esophagus. She is frustrated that she cannot lose weight.   Pt Diabetic: No  Pt smoker: former smoker, quit early 1990's   Pt meds include:  Statin : Yes  Betablocker: Yes  ASA: Yes  Other anticoagulants/antiplatelets: no   Past Medical History  Diagnosis Date  . Hyperlipidemia   . Coronary artery disease   . Hypertension   . PVD (peripheral vascular disease)     endarterectomy by [name]  . Pre-syncope   . Thrombophlebitis     following childbirth  . Cerebrovascular disease     extracranial; occlusive  . Ulcer   . Dizziness   . Chicken pox   . Depression   . Fainting spell   . Allergy     hay fever  . Anemia   . DVT (deep venous thrombosis)   . Heart murmur     Social History History  Substance Use Topics  . Smoking status: Former Smoker -- 0.30 packs/day for 15 years    Types: Cigarettes    Quit date: 08/31/1996  . Smokeless tobacco: Never Used  . Alcohol Use: No    Family History Family History  Problem Relation Age of Onset  . Aneurysm Mother     brain  . Heart disease Mother     Aneyursm   . Hyperlipidemia Mother   . Hypertension  Mother   . Heart disease Father   . Cirrhosis Father   . Heart attack Father   . Heart disease Brother     Before age 32  . Aneurysm Brother   . Hypertension Other   . Cancer Sister     lung  . Heart disease Sister     Aneurysm  . Varicose Veins Sister   . Breast cancer Neg Hx   . Colon cancer Neg Hx     Surgical History Past Surgical History  Procedure Laterality Date  . Endarterectomy  2006    [name]  . Septoplasty      with bilateral inferior turbinate reductions  . Appendectomy    . Carotid endarterectomy  1992  . Eye surgery  Feb. 2013    Cataract Left eye  . Breast surgery      saline implants  . Cataract extraction, bilateral  2013  . Upper gi endoscopy    . Cholecystectomy, laparoscopic  04/02/14    Dr. Rochel Brome  . Coronary artery bypass graft  2006    x3 Dr. Prescott Gum  . Total abdominal hysterectomy      Allergies  Allergen Reactions  . Atorvastatin Nausea And Vomiting  . Codeine Nausea And Vomiting  . Demerol  Nausea And Vomiting  . Oxycodone Hcl Other (See Comments)    Hallucinations   . Rosuvastatin Other (See Comments)    HIGHER DOSE-Causes Leg cramps  . Ivp Dye [Iodinated Diagnostic Agents]     Hives, itching  . Shellfish Allergy     Hives, itching  . Xanax [Alprazolam]     Sedation, even at low dose    Current Outpatient Prescriptions  Medication Sig Dispense Refill  . aspirin 81 MG tablet Take 162 mg by mouth daily.     . carvedilol (COREG) 6.25 MG tablet TAKE 1 TABLET BY MOUTH TWICE A DAY WITH FOOD 120 tablet 3  . furosemide (LASIX) 20 MG tablet Take 1 tablet (20 mg total) by mouth daily as needed for fluid. 90 tablet 3  . hydrochlorothiazide (HYDRODIURIL) 50 MG tablet TAKE 1 TABLET (50 MG TOTAL) BY MOUTH DAILY. 90 tablet 3  . KLOR-CON M10 10 MEQ tablet Take 10 mEq by mouth every other day.     . ranitidine (ZANTAC) 150 MG tablet Take 1 tablet (150 mg total) by mouth 2 (two) times daily as needed for heartburn (or abdominal pain).     . rosuvastatin (CRESTOR) 10 MG tablet Take 10 mg by mouth daily.     . sucralfate (CARAFATE) 1 GM/10ML suspension Take 10 mLs (1 g total) by mouth 4 (four) times daily -  with meals and at bedtime. 420 mL 1  . traZODone (DESYREL) 50 MG tablet Take 0.5-1 tablets (25-50 mg total) by mouth at bedtime. 30 tablet 1   No current facility-administered medications for this visit.    Review of Systems : See HPI for pertinent positives and negatives.  Physical Examination  Filed Vitals:   07/03/14 1413 07/03/14 1416  BP: 154/61 144/61  Pulse: 49 50  Resp:  14  Height:  5\' 2"  (1.575 m)  Weight:  159 lb (72.122 kg)  SpO2:  99%   Body mass index is 29.07 kg/(m^2).  General: WDWN female in NAD  GAIT: normal  Eyes: PERRLA  Pulmonary: CTAB, Negative Rales, Negative rhonchi, & Negative wheezing.  Cardiac: regular Rhythm , no detected murmur.   VASCULAR EXAM  Carotid Bruits  Left  Right    positive  positive  Aorta is not palpable.  Radial pulses are 2+ palpable and equal.   LE Pulses  LEFT  RIGHT   POPLITEAL  not palpable  not palpable   POSTERIOR TIBIAL  palpable  palpable   DORSALIS PEDIS  ANTERIOR TIBIAL  palpable  palpable    Gastrointestinal: soft, nontender, BS WNL, no r/g, no palpated masses.  Musculoskeletal: Negative muscle atrophy/wasting. M/S 5/5 throughout, Extremities without ischemic changes.  Neurologic: A&O X 3; Appropriate Affect ; SENSATION ;normal;  Speech is normal  CN 2-12 intact, Pain and light touch intact in extremities, Motor exam as listed above.    Non-Invasive Vascular Imaging CAROTID DUPLEX 07/03/2014   CEREBROVASCULAR DUPLEX EVALUATION    INDICATION: Carotid artery stenosis    PREVIOUS INTERVENTION(S): Right carotid endarterectomy 04/21/1991; revised 02/13/2005 due to pseudoaneurysm.    DUPLEX EXAM:     RIGHT  LEFT  Peak Systolic Velocities (cm/s) End Diastolic Velocities (cm/s) Plaque LOCATION Peak Systolic  Velocities (cm/s) End Diastolic Velocities (cm/s) Plaque  118 18  CCA PROXIMAL 105 15   97 21 HM CCA MID 106 19   66 14 HM CCA DISTAL 98 17 HT  NV NV NV ECA 130 11 HT  76 15 HM ICA PROXIMAL 518 122 HT  109 28  ICA MID 132 29   130 31  ICA DISTAL 123 33     Carotid endarterectomy /revision ICA / CCA Ratio (PSV) 4.9  Antegrade Vertebral Flow Antegrade  668 Brachial Systolic Pressure (mmHg) 159  Triphasic Brachial Artery Waveforms Triphasic    Plaque Morphology:  HM = Homogeneous, HT = Heterogeneous, CP = Calcific Plaque, SP = Smooth Plaque, IP = Irregular Plaque     ADDITIONAL FINDINGS:     IMPRESSION: Right internal carotid artery is patent with history of carotid endarterectomy, hyperplasia versus soft plaque present at the proximal and mid/distal endarterectomized segment suggestive of stenosis less than 40%. Left internal carotid artery stenosis present in the 80%-99% range.    Compared to the previous exam:  Stable on the right and increase on the left since previous study on 12/26/2013.      Assessment: Catherine Green is a 73 y.o. female who presents with asymptomatic minimal right ICA stenosis s/p right CEA and 80-99% left ICA stenosis. Stable on the right and increase on the left since previous study on 12/26/2013. She has not had any stroke nor TIA symptoms since 1991.  Plan: [name] spoke with and examined patient. She will be scheduled for left CEA.  I discussed in depth with the patient the nature of atherosclerosis, and emphasized the importance of maximal medical management including strict control of blood pressure, blood glucose, and lipid levels, obtaining regular exercise, and continued cessation of smoking.  The patient is aware that without maximal medical management the underlying atherosclerotic disease process will progress, limiting the benefit of any interventions. The patient was given information about stroke prevention and what symptoms should prompt  the patient to seek immediate medical care. Thank you for allowing [place] to participate in this patient's care.  Clemon Chambers, RN, MSN, FNP-C [facility] Office: [phone]  Clinic Physician: Early  07/03/2014 1:58 PM

## 2014-07-03 NOTE — Patient Instructions (Addendum)
Stroke Prevention Some medical conditions and behaviors are associated with an increased chance of having a stroke. You may prevent a stroke by making healthy choices and managing medical conditions. HOW CAN I REDUCE MY RISK OF HAVING A STROKE?   Stay physically active. Get at least 30 minutes of activity on most or all days.  Do not smoke. It may also be helpful to avoid exposure to secondhand smoke.  Limit alcohol use. Moderate alcohol use is considered to be:  No more than 2 drinks per day for men.  No more than 1 drink per day for nonpregnant women.  Eat healthy foods. This involves:  Eating 5 or more servings of fruits and vegetables a day.  Making dietary changes that address high blood pressure (hypertension), high cholesterol, diabetes, or obesity.  Manage your cholesterol levels.  Making food choices that are high in fiber and low in saturated fat, trans fat, and cholesterol may control cholesterol levels.  Take any prescribed medicines to control cholesterol as directed by your health care provider.  Manage your diabetes.  Controlling your carbohydrate and sugar intake is recommended to manage diabetes.  Take any prescribed medicines to control diabetes as directed by your health care provider.  Control your hypertension.  Making food choices that are low in salt (sodium), saturated fat, trans fat, and cholesterol is recommended to manage hypertension.  Take any prescribed medicines to control hypertension as directed by your health care provider.  Maintain a healthy weight.  Reducing calorie intake and making food choices that are low in sodium, saturated fat, trans fat, and cholesterol are recommended to manage weight.  Stop drug abuse.  Avoid taking birth control pills.  Talk to your health care provider about the risks of taking birth control pills if you are over 35 years old, smoke, get migraines, or have ever had a blood clot.  Get evaluated for sleep  disorders (sleep apnea).  Talk to your health care provider about getting a sleep evaluation if you snore a lot or have excessive sleepiness.  Take medicines only as directed by your health care provider.  For some people, aspirin or blood thinners (anticoagulants) are helpful in reducing the risk of forming abnormal blood clots that can lead to stroke. If you have the irregular heart rhythm of atrial fibrillation, you should be on a blood thinner unless there is a good reason you cannot take them.  Understand all your medicine instructions.  Make sure that other conditions (such as anemia or atherosclerosis) are addressed. SEEK IMMEDIATE MEDICAL CARE IF:   You have sudden weakness or numbness of the face, arm, or leg, especially on one side of the body.  Your face or eyelid droops to one side.  You have sudden confusion.  You have trouble speaking (aphasia) or understanding.  You have sudden trouble seeing in one or both eyes.  You have sudden trouble walking.  You have dizziness.  You have a loss of balance or coordination.  You have a sudden, severe headache with no known cause.  You have new chest pain or an irregular heartbeat. Any of these symptoms may represent a serious problem that is an emergency. Do not wait to see if the symptoms will go away. Get medical help at once. Call your local emergency services (911 in [place] Do not drive yourself to the hospital. Document Released: 09/24/2004 Document Revised: 01/01/2014 Document Reviewed: 02/17/2013 [employer] Patient Information 2015 [employer]. This information is not intended to replace advice given   to you by your health care provider. Make sure you discuss any questions you have with your health care provider.   Carotid Endarterectomy  A carotid endarterectomy is a surgery to remove a blockage in the carotid arteries. The carotid arteries are the large blood vessels on both sides of the neck that supply blood to the  brain. Carotid artery disease, also called carotid artery stenosis, is the narrowing or blockage of one or both carotid arteries. Carotid artery disease is usually caused by atherosclerosis, which is a buildup of fat and plaque in the arteries. Some plaque buildup normally occurs with aging. The plaque may partially or totally block blood flow or cause a clot to form in the carotid arteries.  LET [facility] CARE PROVIDER KNOW ABOUT:  Any allergies you have.   All medicines you are taking, including vitamins, herbs, eye drops, creams, and over-the-counter medicines.   Use of steroids (by mouth or creams).   Previous problems you or members of your family have had with the use of anesthetics.   Any blood disorders you have.   Previous surgeries you have had.   Medical conditions you have, including diabetes and kidney problems.   Possibility of pregnancy, if this applies.  RISKS AND COMPLICATIONS Generally, carotid endarterectomy is a safe procedure. However, problems can occur and include:  Bleeding.   Infection.   Transient ischemic attacks (TIAs). A TIA results from poor blood flow to the brain, but there is no permanent loss of brain function.   Stroke.   Heart attack (myocardial infarction).   High blood pressure (hypertension).   Injury to nerves near the carotid arteries.  BEFORE THE PROCEDURE   Ask your health care provider about changing or stopping your regular medicines. This is especially important if you are taking diabetes medicines or blood thinners.  Do not eat or drink anything after midnight on the night before the procedure or as directed by your health care provider. Ask your health care provider if it is okay to take any needed medicines with a sip of water.   Do not smoke for as long as possible before the surgery. Smoking will increase the chance of a healing problem after surgery.   You may need to have blood tests, a test to check  heart rhythm (electrocardiography), or a test to check blood flow (angiography) done before the surgery.  PROCEDURE   You will be given a medicine that makes you fall asleep (general anesthetic).  After you receive the anesthetic, the surgeon will make a small cut (incision) in your neck to expose the carotid artery.  A tube may be inserted into the carotid artery above and below the blockage. This tube will temporarily allow blood to flow around the blockage during the surgery.  An incision will be made in the carotid artery at the location of the blockage, and the blockage will then be removed. In some cases, a section of the carotid artery may be removed and a graft patch used to repair the artery.  The carotid artery will then be closed with stitches (sutures).  If a tube was inserted into the artery to allow blood flow around the blockage during surgery, the tube will be removed. With the tube removal, blood flow to the brain will be restored through the carotid artery.  The incision in the neck will then be closed with sutures. AFTER THE PROCEDURE  You may have some pain or an ache in your neck for a  couple weeks. This is normal.  Document Released: 04/19/2013 Document Revised: 01/01/2014 Document Reviewed: 04/19/2013 [facility] Patient Information 2015 [place], [place]. This information is not intended to replace advice given to you by your health care provider. Make sure you discuss any questions you have with your health care provider.   Carotid Endarterectomy, Care After Refer to this sheet in the next few weeks. These instructions provide you with information on caring for yourself after your procedure. Your health care provider may also give you more specific instructions. Your treatment has been planned according to current medical practices, but problems sometimes occur. Call your health care provider if you have any problems or questions after your procedure.  WHAT TO EXPECT  AFTER THE PROCEDURE You may have some pain or an ache in your neck for up to 2 weeks. This is normal. Recovery time varies depending on your age, condition, general health, and other factors. You will likely be able to return to a normal lifestyle within a few weeks.  HOME CARE INSTRUCTIONS   Take showers if your health care provider approves. Do not take baths, swim, or use a hot tub until your health care provider approves.   Take medicines only as directed by your health care provider. If a blood thinner (anticoagulant) is prescribed after surgery, take this medicine exactly as directed.  Change bandages (dressings) as directed by your health care provider.   Avoid heavy lifting or strenuous activity until your health care provider says it is okay. Resume your normal activities as directed.   Stop smoking if you smoke. This is a risk factor for poor wound healing.   Stop taking the pill (oral contraceptives) unless your health care provider recommends otherwise.   Maintain good control of your blood pressure.   Exercise regularly or as instructed by your health care provider.   Eat a heart-healthy diet. Talk to your health care provider about how to lower blood lipids (cholesterol and triglycerides).   Keep all follow-up visits as directed by your health care provider. Make an appointment for the removal of stitches (sutures) or staples. SEEK MEDICAL CARE IF:   You have increased bleeding from the incision site.   You notice redness, swelling, or increasing pain at the incision site.   You notice swelling in your neck or have difficulty breathing or talking.   You notice a bad smell or pus coming from the incision site or dressing.   You have a fever.  You develop a rash.   You develop any reaction or side effects to medicine given.  SEEK IMMEDIATE MEDICAL CARE IF:   Your initial symptoms are getting worse rather than better.   You develop any abnormal  bruising or bleeding.   You have difficulty breathing.   You develop chest pain, shortness of breath, or pain or swelling in your legs.   You have a return of symptoms or problems that caused you to have this surgery.   You develop a temporary loss of vision.   You develop temporary numbness on one side.   You develop a temporary inability to speak (aphasia).   You develop temporary weakness.  MAKE SURE YOU:   Understand these instructions.  Will watch your condition.  Will get help right away if you are not doing well or get worse. Document Released: 03/06/2005 Document Revised: 01/01/2014 Document Reviewed: 01/18/2013 [facility] Patient Information 2015 [place], [place]. This information is not intended to replace advice given to you by your health care  provider. Make sure you discuss any questions you have with your health care provider.

## 2014-07-13 ENCOUNTER — Encounter (HOSPITAL_COMMUNITY): Payer: Self-pay

## 2014-07-13 ENCOUNTER — Encounter (HOSPITAL_COMMUNITY)
Admission: RE | Admit: 2014-07-13 | Discharge: 2014-07-13 | Disposition: A | Discharge status: Home / Self Care | Payer: Commercial Managed Care - HMO | Source: Ambulatory Visit | Attending: Vascular Surgery | Admitting: Vascular Surgery

## 2014-07-13 DIAGNOSIS — Z90710 Acquired absence of both cervix and uterus: Secondary | ICD-10-CM | POA: Insufficient documentation

## 2014-07-13 DIAGNOSIS — R55 Syncope and collapse: Secondary | ICD-10-CM | POA: Insufficient documentation

## 2014-07-13 DIAGNOSIS — Z87891 Personal history of nicotine dependence: Secondary | ICD-10-CM | POA: Diagnosis not present

## 2014-07-13 DIAGNOSIS — I6522 Occlusion and stenosis of left carotid artery: Secondary | ICD-10-CM | POA: Insufficient documentation

## 2014-07-13 DIAGNOSIS — F419 Anxiety disorder, unspecified: Secondary | ICD-10-CM | POA: Diagnosis not present

## 2014-07-13 DIAGNOSIS — Z9049 Acquired absence of other specified parts of digestive tract: Secondary | ICD-10-CM | POA: Insufficient documentation

## 2014-07-13 DIAGNOSIS — I2510 Atherosclerotic heart disease of native coronary artery without angina pectoris: Secondary | ICD-10-CM | POA: Diagnosis not present

## 2014-07-13 DIAGNOSIS — R011 Cardiac murmur, unspecified: Secondary | ICD-10-CM | POA: Insufficient documentation

## 2014-07-13 DIAGNOSIS — Z8673 Personal history of transient ischemic attack (TIA), and cerebral infarction without residual deficits: Secondary | ICD-10-CM | POA: Insufficient documentation

## 2014-07-13 DIAGNOSIS — Z86718 Personal history of other venous thrombosis and embolism: Secondary | ICD-10-CM | POA: Insufficient documentation

## 2014-07-13 DIAGNOSIS — I701 Atherosclerosis of renal artery: Secondary | ICD-10-CM | POA: Diagnosis not present

## 2014-07-13 DIAGNOSIS — R002 Palpitations: Secondary | ICD-10-CM | POA: Diagnosis not present

## 2014-07-13 DIAGNOSIS — I10 Essential (primary) hypertension: Secondary | ICD-10-CM | POA: Insufficient documentation

## 2014-07-13 DIAGNOSIS — Z951 Presence of aortocoronary bypass graft: Secondary | ICD-10-CM | POA: Insufficient documentation

## 2014-07-13 DIAGNOSIS — E785 Hyperlipidemia, unspecified: Secondary | ICD-10-CM | POA: Diagnosis not present

## 2014-07-13 DIAGNOSIS — F329 Major depressive disorder, single episode, unspecified: Secondary | ICD-10-CM | POA: Diagnosis not present

## 2014-07-13 DIAGNOSIS — I739 Peripheral vascular disease, unspecified: Secondary | ICD-10-CM | POA: Insufficient documentation

## 2014-07-13 DIAGNOSIS — Z01818 Encounter for other preprocedural examination: Secondary | ICD-10-CM | POA: Diagnosis present

## 2014-07-13 DIAGNOSIS — K219 Gastro-esophageal reflux disease without esophagitis: Secondary | ICD-10-CM | POA: Insufficient documentation

## 2014-07-13 HISTORY — DX: Anxiety disorder, unspecified: F41.9

## 2014-07-13 HISTORY — DX: Other seasonal allergic rhinitis: J30.2

## 2014-07-13 HISTORY — DX: Effusion, right ankle: M25.471

## 2014-07-13 HISTORY — DX: Nausea with vomiting, unspecified: R11.2

## 2014-07-13 HISTORY — DX: Headache, unspecified: R51.9

## 2014-07-13 HISTORY — DX: Other specified postprocedural states: Z98.890

## 2014-07-13 HISTORY — DX: Headache: R51

## 2014-07-13 HISTORY — DX: Effusion, right ankle: M25.472

## 2014-07-13 HISTORY — DX: Gastro-esophageal reflux disease without esophagitis: K21.9

## 2014-07-13 HISTORY — DX: Reserved for inherently not codable concepts without codable children: IMO0001

## 2014-07-13 LAB — TYPE AND SCREEN
ABO/RH(D): A POS
Antibody Screen: NEGATIVE

## 2014-07-13 LAB — CBC
HCT: 39.1 % (ref 36.0–46.0)
Hemoglobin: 13 g/dL (ref 12.0–15.0)
MCH: 28 pg (ref 26.0–34.0)
MCHC: 33.2 g/dL (ref 30.0–36.0)
MCV: 84.3 fL (ref 78.0–100.0)
Platelets: 330 10*3/uL (ref 150–400)
RBC: 4.64 MIL/uL (ref 3.87–5.11)
RDW: 13.5 % (ref 11.5–15.5)
WBC: 6.4 10*3/uL (ref 4.0–10.5)

## 2014-07-13 LAB — URINE MICROSCOPIC-ADD ON

## 2014-07-13 LAB — COMPREHENSIVE METABOLIC PANEL
ALT: 19 U/L (ref 0–35)
AST: 21 U/L (ref 0–37)
Albumin: 4.1 g/dL (ref 3.5–5.2)
Alkaline Phosphatase: 88 U/L (ref 39–117)
Anion gap: 15 (ref 5–15)
BUN: 20 mg/dL (ref 6–23)
CO2: 31 mEq/L (ref 19–32)
Calcium: 10 mg/dL (ref 8.4–10.5)
Chloride: 93 mEq/L — ABNORMAL LOW (ref 96–112)
Creatinine, Ser: 0.83 mg/dL (ref 0.50–1.10)
GFR calc Af Amer: 79 mL/min — ABNORMAL LOW (ref >90)
GFR calc non Af Amer: 68 mL/min — ABNORMAL LOW (ref >90)
Glucose, Bld: 102 mg/dL — ABNORMAL HIGH (ref 70–99)
Potassium: 3.1 mEq/L — ABNORMAL LOW (ref 3.7–5.3)
Sodium: 139 mEq/L (ref 137–147)
Total Bilirubin: 0.4 mg/dL (ref 0.3–1.2)
Total Protein: 7.5 g/dL (ref 6.0–8.3)

## 2014-07-13 LAB — URINALYSIS, ROUTINE W REFLEX MICROSCOPIC
Bilirubin Urine: NEGATIVE
Glucose, UA: NEGATIVE mg/dL
Hgb urine dipstick: NEGATIVE
Ketones, ur: NEGATIVE mg/dL
Nitrite: NEGATIVE
Protein, ur: NEGATIVE mg/dL
Specific Gravity, Urine: 1.02 (ref 1.005–1.030)
Urobilinogen, UA: 1 mg/dL (ref 0.0–1.0)
pH: 7 (ref 5.0–8.0)

## 2014-07-13 LAB — APTT: aPTT: 33 seconds (ref 24–37)

## 2014-07-13 LAB — PROTIME-INR
INR: 1.02 (ref 0.00–1.49)
Prothrombin Time: 13.5 seconds (ref 11.6–15.2)

## 2014-07-13 LAB — SURGICAL PCR SCREEN
MRSA, PCR: NEGATIVE
Staphylococcus aureus: NEGATIVE

## 2014-07-13 LAB — ABO/RH: ABO/RH(D): A POS

## 2014-07-13 NOTE — Progress Notes (Signed)
PCP is Elsie Stain and Cardiologist is Ida Rogue. Patient denied having any chest pain but stated she had been having "palpitations" since July of this year and stated that Dr. Rockey Situ was aware and was not concerned about it. Patient stated "they are no different than what they have been." Patient also informed Nurse that she has dizzy spells at times and stated the last spell she had was yesterday while she was out shopping. Patient stated "I can tell when it happens...all of a sudden I get very dizzy and nauseated and then after I rest it goes away, I don't know where it comes from...Marland KitchenMarland KitchenI haven't been diagnosed yet." Patient denied having any dizziness at this time, and denied having any abdominal pain but stated " I just have some nausea at times." Patient informed Nurse that she has severe N/V after surgery but stated back in 1991 or 1992 at [facility] was the only time she woke up feeling great without any N/V.

## 2014-07-13 NOTE — Pre-Procedure Instructions (Signed)
EVOLETT SOMARRIBA  07/13/2014   Your procedure is scheduled on:  Friday July 20, 2014 at 9:45 AM.  Report to [facility] Admitting at 7:45 AM.  Call this number if you have problems the morning of surgery: [phone]   Call this number if you have any questions prior to surgery: [phone]   Remember:   Do not eat food or drink liquids after midnight.   Take these medicines the morning of surgery with A SIP OF WATER: Carvedilol (Coreg), Cetirizine (Zyrtec) if needed, Ranitidine (Zantac) if needed, and Protonix   Do not wear jewelry, make-up or nail polish.  Do not wear lotions, powders, or perfumes.   Do not shave 48 hours prior to surgery.   Do not bring valuables to the hospital.  [facility] is not responsible for any belongings or valuables.               Contacts, dentures or bridgework may not be worn into surgery.  Leave suitcase in the car. After surgery it may be brought to your room.  For patients admitted to the hospital, discharge time is determined by your treatment team.               Patients discharged the day of surgery will not be allowed to drive home.  Name and phone number of your driver: Family/Friend  Special Instructions: Shower using CHG soap the night before and the morning of your surgery   Please read over the following fact sheets that you were given: Pain Booklet, Coughing and Deep Breathing, Blood Transfusion Information, MRSA Information and Surgical Site Infection Prevention

## 2014-07-13 NOTE — Progress Notes (Signed)
Anesthesia Chart Review:  Patient is a 73 year old female scheduled for left CEA on 07/20/14 by [name].  History includes former smoker, CAD s/p CABG X 3 '06, murmur (not specified), palpitations, HTN, HLD, CVA/TIA '91, GERD, DVT, anxiety, depression, history of presyncope/syncope (no recent), PVD with history of right CEA '92 with revision '06 due to pseudoaneurysm, hysterectomy, septoplasty with turbinate reduction, breast augmentation (saline), cholecystectomy 03/13/2014 [facility]). PCP is listed as Dr. Elsie Stain.    Cardiologist is Dr. Ida Rogue, last visit 05/25/14.  She reported palpitations and abdominal sensation radiating to her chest.  By notes he felt it could be related to postsurgical recovery or anxiety. By notes, he said not further work-up at this time, but could consider stress test if symptoms persisted. Today she told her PAT RN that her pain has resolved. She continues to have intermittent nausea and palpitations that were present even prior to her cholecystectomy.    EKG on 05/25/14: NSR with diffuse non-specific ST abnormality. Dr. Rockey Situ felt it was no significantly changed from prior EKGs.  Cardiac cath on 09/07/07: 1. Significant left main and one-vessel CAD. 2. Patent but small left internal mammary artery to the left anterior descending. 3. Patent vein graft to diagonal with no significant disease in the body of the graft to distal graft insertion. 4. Patent vein graft to obtuse marginal with no significant disease in the body of the graft or distal to graft insertion. 5. Normal left ventricular systolic function. EF 60%. 6. Mild left renal artery stenosis. 7. Systemic hypertension. 8. Elevated left ventricular end-diastolic pressure.  Carotid duplex on 07/03/14: 80-99% left ICA stenosis.  Right ICA hyperplasia versus soft plaque at the proximal and mid-distal endarterectomized segment suggestive of < 40% stenosis.  Preoperative labs noted.  K 3.1.  Cr 0.83. CBC,  PT/PTT WNL.    Above discussed with anesthesiologist Dr. Marcie Bal.  Patient reported pain had improved at her PAT visit and had no new CV symptoms since her last visit with Dr. Rockey Situ in 05/2014. She tolerated cholecystectomy 4 months ago.  If no new or progressive CV/CHF symptoms then it is anticipated that she can proceed as planned.  [facility]/Anesthesiology Phone [phone] 07/13/2014 3:43 PM

## 2014-07-19 MED ORDER — DEXTROSE 5 % IV SOLN
1.5000 g | INTRAVENOUS | Status: AC
  Administered 2014-07-20: 1.5 g via INTRAVENOUS
  Filled 2014-07-19: qty 1.5

## 2014-07-19 MED ORDER — SODIUM CHLORIDE 0.9 % IV SOLN: INTRAVENOUS | Status: DC

## 2014-07-20 ENCOUNTER — Telehealth: Payer: Self-pay | Admitting: Vascular Surgery

## 2014-07-20 ENCOUNTER — Inpatient Hospital Stay (HOSPITAL_COMMUNITY): Payer: Commercial Managed Care - HMO | Admitting: Certified Registered Nurse Anesthetist

## 2014-07-20 ENCOUNTER — Encounter (HOSPITAL_COMMUNITY): Payer: Self-pay | Admitting: *Deleted

## 2014-07-20 ENCOUNTER — Encounter (HOSPITAL_COMMUNITY)
Admission: RE | Disposition: A | Discharge status: Home / Self Care | Payer: Self-pay | Source: Ambulatory Visit | Attending: Vascular Surgery

## 2014-07-20 ENCOUNTER — Inpatient Hospital Stay (HOSPITAL_COMMUNITY)
Admission: RE | Admit: 2014-07-20 | Discharge: 2014-07-21 | DRG: 039 | Disposition: A | Discharge status: Home / Self Care | Payer: Commercial Managed Care - HMO | Source: Ambulatory Visit | Attending: Vascular Surgery | Admitting: Vascular Surgery

## 2014-07-20 ENCOUNTER — Inpatient Hospital Stay (HOSPITAL_COMMUNITY): Payer: Commercial Managed Care - HMO | Admitting: Vascular Surgery

## 2014-07-20 DIAGNOSIS — I10 Essential (primary) hypertension: Secondary | ICD-10-CM | POA: Diagnosis present

## 2014-07-20 DIAGNOSIS — Z9049 Acquired absence of other specified parts of digestive tract: Secondary | ICD-10-CM | POA: Diagnosis present

## 2014-07-20 DIAGNOSIS — D649 Anemia, unspecified: Secondary | ICD-10-CM | POA: Diagnosis present

## 2014-07-20 DIAGNOSIS — Z87891 Personal history of nicotine dependence: Secondary | ICD-10-CM

## 2014-07-20 DIAGNOSIS — Z79899 Other long term (current) drug therapy: Secondary | ICD-10-CM

## 2014-07-20 DIAGNOSIS — Z8249 Family history of ischemic heart disease and other diseases of the circulatory system: Secondary | ICD-10-CM

## 2014-07-20 DIAGNOSIS — I6523 Occlusion and stenosis of bilateral carotid arteries: Principal | ICD-10-CM | POA: Diagnosis present

## 2014-07-20 DIAGNOSIS — E785 Hyperlipidemia, unspecified: Secondary | ICD-10-CM | POA: Diagnosis present

## 2014-07-20 DIAGNOSIS — Z8673 Personal history of transient ischemic attack (TIA), and cerebral infarction without residual deficits: Secondary | ICD-10-CM

## 2014-07-20 DIAGNOSIS — I2510 Atherosclerotic heart disease of native coronary artery without angina pectoris: Secondary | ICD-10-CM | POA: Diagnosis present

## 2014-07-20 DIAGNOSIS — Z885 Allergy status to narcotic agent status: Secondary | ICD-10-CM | POA: Diagnosis not present

## 2014-07-20 DIAGNOSIS — Z888 Allergy status to other drugs, medicaments and biological substances status: Secondary | ICD-10-CM

## 2014-07-20 DIAGNOSIS — F329 Major depressive disorder, single episode, unspecified: Secondary | ICD-10-CM | POA: Diagnosis present

## 2014-07-20 DIAGNOSIS — Z90710 Acquired absence of both cervix and uterus: Secondary | ICD-10-CM | POA: Diagnosis not present

## 2014-07-20 DIAGNOSIS — Z91013 Allergy to seafood: Secondary | ICD-10-CM

## 2014-07-20 DIAGNOSIS — Z86718 Personal history of other venous thrombosis and embolism: Secondary | ICD-10-CM | POA: Diagnosis not present

## 2014-07-20 DIAGNOSIS — Z9889 Other specified postprocedural states: Secondary | ICD-10-CM

## 2014-07-20 DIAGNOSIS — Z86010 Personal history of colonic polyps: Secondary | ICD-10-CM

## 2014-07-20 DIAGNOSIS — I6529 Occlusion and stenosis of unspecified carotid artery: Secondary | ICD-10-CM | POA: Diagnosis present

## 2014-07-20 DIAGNOSIS — I739 Peripheral vascular disease, unspecified: Secondary | ICD-10-CM | POA: Diagnosis present

## 2014-07-20 DIAGNOSIS — Z951 Presence of aortocoronary bypass graft: Secondary | ICD-10-CM

## 2014-07-20 DIAGNOSIS — Z91041 Radiographic dye allergy status: Secondary | ICD-10-CM | POA: Diagnosis not present

## 2014-07-20 DIAGNOSIS — Z7982 Long term (current) use of aspirin: Secondary | ICD-10-CM | POA: Diagnosis not present

## 2014-07-20 DIAGNOSIS — I6522 Occlusion and stenosis of left carotid artery: Secondary | ICD-10-CM | POA: Diagnosis present

## 2014-07-20 HISTORY — PX: ENDARTERECTOMY: SHX5162

## 2014-07-20 LAB — CBC
HCT: 32.2 % — ABNORMAL LOW (ref 36.0–46.0)
Hemoglobin: 10.5 g/dL — ABNORMAL LOW (ref 12.0–15.0)
MCH: 27.3 pg (ref 26.0–34.0)
MCHC: 32.6 g/dL (ref 30.0–36.0)
MCV: 83.9 fL (ref 78.0–100.0)
Platelets: 254 10*3/uL (ref 150–400)
RBC: 3.84 MIL/uL — ABNORMAL LOW (ref 3.87–5.11)
RDW: 13.6 % (ref 11.5–15.5)
WBC: 7 10*3/uL (ref 4.0–10.5)

## 2014-07-20 LAB — CREATININE, SERUM
Creatinine, Ser: 0.76 mg/dL (ref 0.50–1.10)
GFR calc Af Amer: >90 mL/min (ref >90)
GFR calc non Af Amer: 81 mL/min — ABNORMAL LOW (ref >90)

## 2014-07-20 SURGERY — ENDARTERECTOMY, CAROTID
Anesthesia: General | Site: Neck | Laterality: Left

## 2014-07-20 MED ORDER — DEXTROSE 5 % IV SOLN
1.5000 g | Freq: Two times a day (BID) | INTRAVENOUS | Status: DC
  Administered 2014-07-20: 1.5 g via INTRAVENOUS
  Filled 2014-07-20 (×2): qty 1.5

## 2014-07-20 MED ORDER — GLYCOPYRROLATE 0.2 MG/ML IJ SOLN
INTRAMUSCULAR | Status: AC
  Filled 2014-07-20: qty 1

## 2014-07-20 MED ORDER — FAMOTIDINE 20 MG PO TABS
20.0000 mg | ORAL_TABLET | Freq: Every day | ORAL | Status: DC
Start: 2014-07-20 — End: 2014-07-21
  Administered 2014-07-20: 20 mg via ORAL
  Filled 2014-07-20 (×2): qty 1

## 2014-07-20 MED ORDER — MAGNESIUM SULFATE 2 GM/50ML IV SOLN: 2.0000 g | Freq: Every day | INTRAVENOUS | Status: DC | PRN

## 2014-07-20 MED ORDER — FUROSEMIDE 20 MG PO TABS
20.0000 mg | ORAL_TABLET | Freq: Every day | ORAL | Status: DC | PRN
  Filled 2014-07-20: qty 1

## 2014-07-20 MED ORDER — PHENYLEPHRINE HCL 10 MG/ML IJ SOLN
INTRAMUSCULAR | Status: DC | PRN
  Administered 2014-07-20: 40 ug via INTRAVENOUS

## 2014-07-20 MED ORDER — DOCUSATE SODIUM 100 MG PO CAPS
100.0000 mg | ORAL_CAPSULE | Freq: Every day | ORAL | Status: DC
  Administered 2014-07-21: 100 mg via ORAL
  Filled 2014-07-20: qty 1

## 2014-07-20 MED ORDER — ASPIRIN 81 MG PO TABS: 162.0000 mg | ORAL_TABLET | Freq: Every day | ORAL | Status: DC

## 2014-07-20 MED ORDER — PROMETHAZINE HCL 25 MG/ML IJ SOLN
INTRAMUSCULAR | Status: AC
  Administered 2014-07-20: 6.25 mg
  Filled 2014-07-20: qty 1

## 2014-07-20 MED ORDER — PROTAMINE SULFATE 10 MG/ML IV SOLN
INTRAVENOUS | Status: AC
  Filled 2014-07-20: qty 5

## 2014-07-20 MED ORDER — SODIUM CHLORIDE 0.9 % IV SOLN
INTRAVENOUS | Status: DC
  Administered 2014-07-20: 20:00:00 via INTRAVENOUS

## 2014-07-20 MED ORDER — HYDRALAZINE HCL 20 MG/ML IJ SOLN: 5.0000 mg | INTRAMUSCULAR | Status: DC | PRN

## 2014-07-20 MED ORDER — HEPARIN SODIUM (PORCINE) 1000 UNIT/ML IJ SOLN
INTRAMUSCULAR | Status: DC | PRN
  Administered 2014-07-20: 7000 [IU] via INTRAVENOUS

## 2014-07-20 MED ORDER — ARTIFICIAL TEARS OP OINT
TOPICAL_OINTMENT | OPHTHALMIC | Status: DC | PRN
  Administered 2014-07-20: 1 via OPHTHALMIC

## 2014-07-20 MED ORDER — ROCURONIUM BROMIDE 100 MG/10ML IV SOLN
INTRAVENOUS | Status: DC | PRN
  Administered 2014-07-20: 30 mg via INTRAVENOUS
  Administered 2014-07-20: 10 mg via INTRAVENOUS

## 2014-07-20 MED ORDER — FENTANYL CITRATE 0.05 MG/ML IJ SOLN
INTRAMUSCULAR | Status: AC
  Filled 2014-07-20: qty 2

## 2014-07-20 MED ORDER — POTASSIUM CHLORIDE CRYS ER 20 MEQ PO TBCR
20.0000 meq | EXTENDED_RELEASE_TABLET | Freq: Every day | ORAL | Status: AC | PRN
  Administered 2014-07-21: 40 meq via ORAL
  Filled 2014-07-20: qty 2

## 2014-07-20 MED ORDER — PROPOFOL INFUSION 10 MG/ML OPTIME
INTRAVENOUS | Status: DC | PRN
  Administered 2014-07-20: 10 ug/kg/min via INTRAVENOUS

## 2014-07-20 MED ORDER — METOCLOPRAMIDE HCL 5 MG/ML IJ SOLN
INTRAMUSCULAR | Status: DC | PRN
  Administered 2014-07-20: 10 mg via INTRAVENOUS

## 2014-07-20 MED ORDER — PHENOL 1.4 % MT LIQD: 1.0000 | OROMUCOSAL | Status: DC | PRN

## 2014-07-20 MED ORDER — SENNOSIDES-DOCUSATE SODIUM 8.6-50 MG PO TABS
1.0000 | ORAL_TABLET | Freq: Every evening | ORAL | Status: DC | PRN
  Filled 2014-07-20: qty 1

## 2014-07-20 MED ORDER — MORPHINE SULFATE 2 MG/ML IJ SOLN
2.0000 mg | INTRAMUSCULAR | Status: DC | PRN
  Administered 2014-07-20 (×2): 2 mg via INTRAVENOUS
  Filled 2014-07-20 (×2): qty 1

## 2014-07-20 MED ORDER — PROMETHAZINE HCL 25 MG/ML IJ SOLN: 6.2500 mg | INTRAMUSCULAR | Status: DC | PRN

## 2014-07-20 MED ORDER — CHLORHEXIDINE GLUCONATE 4 % EX LIQD
60.0000 mL | Freq: Once | CUTANEOUS | Status: DC
  Filled 2014-07-20: qty 60

## 2014-07-20 MED ORDER — LACTATED RINGERS IV SOLN
INTRAVENOUS | Status: DC
  Administered 2014-07-20: 08:00:00 via INTRAVENOUS

## 2014-07-20 MED ORDER — SODIUM CHLORIDE 0.9 % IV SOLN: 500.0000 mL | Freq: Once | INTRAVENOUS | Status: AC | PRN

## 2014-07-20 MED ORDER — ARTIFICIAL TEARS OP OINT
TOPICAL_OINTMENT | OPHTHALMIC | Status: AC
  Filled 2014-07-20: qty 3.5

## 2014-07-20 MED ORDER — PHENYLEPHRINE 40 MCG/ML (10ML) SYRINGE FOR IV PUSH (FOR BLOOD PRESSURE SUPPORT)
PREFILLED_SYRINGE | INTRAVENOUS | Status: AC
  Filled 2014-07-20: qty 10

## 2014-07-20 MED ORDER — NITROGLYCERIN 0.2 MG/ML ON CALL CATH LAB
INTRAVENOUS | Status: DC | PRN
  Administered 2014-07-20: 20 ug via INTRAVENOUS
  Administered 2014-07-20: 80 ug via INTRAVENOUS
  Administered 2014-07-20: 20 ug via INTRAVENOUS

## 2014-07-20 MED ORDER — PROTAMINE SULFATE 10 MG/ML IV SOLN
INTRAVENOUS | Status: DC | PRN
  Administered 2014-07-20: 50 mg via INTRAVENOUS

## 2014-07-20 MED ORDER — TRAZODONE 25 MG HALF TABLET
25.0000 mg | ORAL_TABLET | Freq: Every day | ORAL | Status: DC
  Administered 2014-07-20: 25 mg via ORAL
  Filled 2014-07-20 (×2): qty 1

## 2014-07-20 MED ORDER — FENTANYL CITRATE 0.05 MG/ML IJ SOLN
INTRAMUSCULAR | Status: DC | PRN
  Administered 2014-07-20: 25 ug via INTRAVENOUS
  Administered 2014-07-20 (×2): 50 ug via INTRAVENOUS
  Administered 2014-07-20 (×3): 25 ug via INTRAVENOUS
  Administered 2014-07-20: 100 ug via INTRAVENOUS

## 2014-07-20 MED ORDER — CARVEDILOL 6.25 MG PO TABS
6.2500 mg | ORAL_TABLET | Freq: Two times a day (BID) | ORAL | Status: DC
  Administered 2014-07-21: 6.25 mg via ORAL
  Filled 2014-07-20 (×3): qty 1

## 2014-07-20 MED ORDER — POTASSIUM CHLORIDE CRYS ER 10 MEQ PO TBCR
10.0000 meq | EXTENDED_RELEASE_TABLET | ORAL | Status: DC
  Filled 2014-07-20: qty 1

## 2014-07-20 MED ORDER — 0.9 % SODIUM CHLORIDE (POUR BTL) OPTIME
TOPICAL | Status: DC | PRN
  Administered 2014-07-20: 1000 mL

## 2014-07-20 MED ORDER — FENTANYL CITRATE 0.05 MG/ML IJ SOLN
INTRAMUSCULAR | Status: AC
  Filled 2014-07-20: qty 5

## 2014-07-20 MED ORDER — SODIUM CHLORIDE 0.9 % IJ SOLN
INTRAMUSCULAR | Status: AC
  Filled 2014-07-20: qty 10

## 2014-07-20 MED ORDER — GLYCOPYRROLATE 0.2 MG/ML IJ SOLN
INTRAMUSCULAR | Status: DC | PRN
  Administered 2014-07-20: 0.2 mg via INTRAVENOUS
  Administered 2014-07-20: 0.6 mg via INTRAVENOUS

## 2014-07-20 MED ORDER — HEPARIN SODIUM (PORCINE) 5000 UNIT/ML IJ SOLN
INTRAMUSCULAR | Status: DC | PRN
  Administered 2014-07-20: 11:00:00

## 2014-07-20 MED ORDER — TRAZODONE 25 MG HALF TABLET
25.0000 mg | ORAL_TABLET | Freq: Every evening | ORAL | Status: DC | PRN
Start: 2014-07-20 — End: 2014-07-21
  Filled 2014-07-20: qty 1

## 2014-07-20 MED ORDER — PROPOFOL 10 MG/ML IV BOLUS
INTRAVENOUS | Status: DC | PRN
  Administered 2014-07-20: 120 mg via INTRAVENOUS

## 2014-07-20 MED ORDER — LACTATED RINGERS IV SOLN
INTRAVENOUS | Status: DC | PRN
  Administered 2014-07-20 (×2): via INTRAVENOUS

## 2014-07-20 MED ORDER — SODIUM CHLORIDE 0.9 % IV SOLN: INTRAVENOUS | Status: DC | PRN

## 2014-07-20 MED ORDER — EPHEDRINE SULFATE 50 MG/ML IJ SOLN
INTRAMUSCULAR | Status: DC | PRN
  Administered 2014-07-20: 2.5 mg via INTRAVENOUS

## 2014-07-20 MED ORDER — ACETAMINOPHEN 325 MG PO TABS: 325.0000 mg | ORAL_TABLET | ORAL | Status: DC | PRN

## 2014-07-20 MED ORDER — HYDROCHLOROTHIAZIDE 50 MG PO TABS
50.0000 mg | ORAL_TABLET | Freq: Every day | ORAL | Status: DC
  Administered 2014-07-20: 50 mg via ORAL
  Filled 2014-07-20 (×2): qty 1

## 2014-07-20 MED ORDER — GUAIFENESIN-DM 100-10 MG/5ML PO SYRP: 15.0000 mL | ORAL_SOLUTION | ORAL | Status: DC | PRN

## 2014-07-20 MED ORDER — SODIUM CHLORIDE 0.9 % IV SOLN
10.0000 mg | INTRAVENOUS | Status: DC | PRN
  Administered 2014-07-20: 10 ug/min via INTRAVENOUS

## 2014-07-20 MED ORDER — LABETALOL HCL 5 MG/ML IV SOLN: 10.0000 mg | INTRAVENOUS | Status: DC | PRN

## 2014-07-20 MED ORDER — METOPROLOL TARTRATE 1 MG/ML IV SOLN: 2.0000 mg | INTRAVENOUS | Status: DC | PRN

## 2014-07-20 MED ORDER — EPHEDRINE SULFATE 50 MG/ML IJ SOLN
INTRAMUSCULAR | Status: AC
  Filled 2014-07-20: qty 1

## 2014-07-20 MED ORDER — ONDANSETRON HCL 4 MG/2ML IJ SOLN
INTRAMUSCULAR | Status: DC | PRN
  Administered 2014-07-20: 4 mg via INTRAVENOUS

## 2014-07-20 MED ORDER — METOCLOPRAMIDE HCL 5 MG/ML IJ SOLN
INTRAMUSCULAR | Status: AC
  Filled 2014-07-20: qty 2

## 2014-07-20 MED ORDER — TRAZODONE 25 MG HALF TABLET
25.0000 mg | ORAL_TABLET | Freq: Every day | ORAL | Status: DC
  Filled 2014-07-20: qty 2

## 2014-07-20 MED ORDER — LIDOCAINE HCL (CARDIAC) 20 MG/ML IV SOLN
INTRAVENOUS | Status: DC | PRN
  Administered 2014-07-20: 60 mg via INTRAVENOUS

## 2014-07-20 MED ORDER — TRAZODONE 25 MG HALF TABLET
25.0000 mg | ORAL_TABLET | Freq: Every day | ORAL | Status: DC
  Filled 2014-07-20: qty 1

## 2014-07-20 MED ORDER — ONDANSETRON HCL 4 MG/2ML IJ SOLN
4.0000 mg | Freq: Four times a day (QID) | INTRAMUSCULAR | Status: DC | PRN
  Administered 2014-07-20 – 2014-07-21 (×2): 4 mg via INTRAVENOUS
  Filled 2014-07-20 (×2): qty 2

## 2014-07-20 MED ORDER — GLYCOPYRROLATE 0.2 MG/ML IJ SOLN
INTRAMUSCULAR | Status: AC
  Filled 2014-07-20: qty 3

## 2014-07-20 MED ORDER — OXYCODONE-ACETAMINOPHEN 5-325 MG PO TABS
1.0000 | ORAL_TABLET | ORAL | Status: DC | PRN
  Administered 2014-07-20: 1 via ORAL
  Filled 2014-07-20: qty 1

## 2014-07-20 MED ORDER — LORATADINE 10 MG PO TABS
10.0000 mg | ORAL_TABLET | Freq: Every day | ORAL | Status: DC
  Administered 2014-07-20: 10 mg via ORAL
  Filled 2014-07-20 (×2): qty 1

## 2014-07-20 MED ORDER — PROPOFOL 10 MG/ML IV BOLUS
INTRAVENOUS | Status: AC
  Filled 2014-07-20: qty 20

## 2014-07-20 MED ORDER — ENOXAPARIN SODIUM 30 MG/0.3ML ~~LOC~~ SOLN
30.0000 mg | SUBCUTANEOUS | Status: DC
  Administered 2014-07-21: 30 mg via SUBCUTANEOUS
  Filled 2014-07-20 (×2): qty 0.3

## 2014-07-20 MED ORDER — NEOSTIGMINE METHYLSULFATE 10 MG/10ML IV SOLN
INTRAVENOUS | Status: DC | PRN
  Administered 2014-07-20: 4 mg via INTRAVENOUS

## 2014-07-20 MED ORDER — ACETAMINOPHEN 650 MG RE SUPP: 325.0000 mg | RECTAL | Status: DC | PRN

## 2014-07-20 MED ORDER — ASPIRIN 81 MG PO CHEW: 162.0000 mg | CHEWABLE_TABLET | Freq: Every day | ORAL | Status: DC

## 2014-07-20 MED ORDER — FENTANYL CITRATE 0.05 MG/ML IJ SOLN
25.0000 ug | INTRAMUSCULAR | Status: DC | PRN
  Administered 2014-07-20: 25 ug via INTRAVENOUS
  Administered 2014-07-20: 50 ug via INTRAVENOUS
  Administered 2014-07-20: 25 ug via INTRAVENOUS

## 2014-07-20 MED ORDER — ALUM & MAG HYDROXIDE-SIMETH 200-200-20 MG/5ML PO SUSP: 15.0000 mL | ORAL | Status: DC | PRN

## 2014-07-20 MED ORDER — ROSUVASTATIN CALCIUM 10 MG PO TABS
10.0000 mg | ORAL_TABLET | Freq: Every day | ORAL | Status: DC
  Administered 2014-07-20: 10 mg via ORAL
  Filled 2014-07-20 (×2): qty 1

## 2014-07-20 MED ORDER — PANTOPRAZOLE SODIUM 40 MG PO TBEC
40.0000 mg | DELAYED_RELEASE_TABLET | Freq: Every day | ORAL | Status: DC
  Administered 2014-07-21: 40 mg via ORAL
  Filled 2014-07-20: qty 1

## 2014-07-20 SURGICAL SUPPLY — 52 items
BENZOIN TINCTURE PRP APPL 2/3 (GAUZE/BANDAGES/DRESSINGS) ×2 IMPLANT
BLADE SURG 10 STRL SS (BLADE) ×2 IMPLANT
CANISTER SUCTION 2500CC (MISCELLANEOUS) ×2 IMPLANT
CANNULA VESSEL W/WING WO/VALVE (CANNULA) ×2 IMPLANT
CATH ROBINSON RED A/P 18FR (CATHETERS) ×2 IMPLANT
CLIP LIGATING EXTRA MED SLVR (CLIP) ×2 IMPLANT
CLIP LIGATING EXTRA SM BLUE (MISCELLANEOUS) ×2 IMPLANT
CLOSURE STERI-STRIP 1/4X4 (GAUZE/BANDAGES/DRESSINGS) ×2 IMPLANT
CRADLE DONUT ADULT HEAD (MISCELLANEOUS) ×2 IMPLANT
DECANTER SPIKE VIAL GLASS SM (MISCELLANEOUS) IMPLANT
DRAIN HEMOVAC 1/8 X 5 (WOUND CARE) IMPLANT
DRSG COVADERM 4X6 (GAUZE/BANDAGES/DRESSINGS) ×2 IMPLANT
DRSG COVADERM 4X8 (GAUZE/BANDAGES/DRESSINGS) IMPLANT
ELECT REM PT RETURN 9FT ADLT (ELECTROSURGICAL) ×2
ELECTRODE REM PT RTRN 9FT ADLT (ELECTROSURGICAL) ×1 IMPLANT
EVACUATOR SILICONE 100CC (DRAIN) IMPLANT
GAUZE SPONGE 4X4 12PLY STRL (GAUZE/BANDAGES/DRESSINGS) ×2 IMPLANT
GEL ULTRASOUND 20GR AQUASONIC (MISCELLANEOUS) IMPLANT
GLOVE BIO SURGEON STRL SZ 6.5 (GLOVE) ×2 IMPLANT
GLOVE BIOGEL PI IND STRL 6.5 (GLOVE) ×2 IMPLANT
GLOVE BIOGEL PI IND STRL 7.5 (GLOVE) ×1 IMPLANT
GLOVE BIOGEL PI INDICATOR 6.5 (GLOVE) ×2
GLOVE BIOGEL PI INDICATOR 7.5 (GLOVE) ×1
GLOVE SS BIOGEL STRL SZ 7 (GLOVE) ×1 IMPLANT
GLOVE SS BIOGEL STRL SZ 7.5 (GLOVE) ×1 IMPLANT
GLOVE SUPERSENSE BIOGEL SZ 7 (GLOVE) ×1
GLOVE SUPERSENSE BIOGEL SZ 7.5 (GLOVE) ×1
GOWN STRL REUS W/ TWL LRG LVL3 (GOWN DISPOSABLE) ×3 IMPLANT
GOWN STRL REUS W/TWL LRG LVL3 (GOWN DISPOSABLE) ×3
HEMASHIELD FINESSE CARDIO (Vascular Products) IMPLANT
KIT BASIN OR (CUSTOM PROCEDURE TRAY) ×2 IMPLANT
KIT ROOM TURNOVER OR (KITS) ×2 IMPLANT
NEEDLE 22X1 1/2 (OR ONLY) (NEEDLE) IMPLANT
NS IRRIG 1000ML POUR BTL (IV SOLUTION) ×4 IMPLANT
PACK CAROTID (CUSTOM PROCEDURE TRAY) ×2 IMPLANT
PAD ARMBOARD 7.5X6 YLW CONV (MISCELLANEOUS) ×4 IMPLANT
PATCH HEMASHIELD 8X75 (Vascular Products) ×2 IMPLANT
PATCH HEMASHIELD FINESS CARDIO (Vascular Products) IMPLANT
PROBE PENCIL 8 MHZ STRL DISP (MISCELLANEOUS) ×2 IMPLANT
SHUNT CAROTID BYPASS 10 (VASCULAR PRODUCTS) ×2 IMPLANT
SHUNT CAROTID BYPASS 12FRX15.5 (VASCULAR PRODUCTS) IMPLANT
SPONGE INTESTINAL PEANUT (DISPOSABLE) ×2 IMPLANT
STRIP CLOSURE SKIN 1/2X4 (GAUZE/BANDAGES/DRESSINGS) ×2 IMPLANT
SUT ETHILON 3 0 PS 1 (SUTURE) IMPLANT
SUT PROLENE 6 0 CC (SUTURE) ×2 IMPLANT
SUT SILK 3 0 (SUTURE)
SUT SILK 3-0 18XBRD TIE 12 (SUTURE) IMPLANT
SUT VIC AB 3-0 SH 27 (SUTURE) ×2
SUT VIC AB 3-0 SH 27X BRD (SUTURE) ×2 IMPLANT
SUT VICRYL 4-0 PS2 18IN ABS (SUTURE) ×2 IMPLANT
SYR CONTROL 10ML LL (SYRINGE) IMPLANT
WATER STERILE IRR 1000ML POUR (IV SOLUTION) ×2 IMPLANT

## 2014-07-20 NOTE — H&P (View-Only) (Signed)
Established Carotid Patient   History of Present Illness  Catherine Green is a 73 y.o. female patient of [name] who is status post right CEA in 1992 with a revision in 2006.  Her cholesterol is high despite taking Crestor, does not have diabetes that she is aware, is physically active, does not smoke.  Exercises 30 minutes daily.  Had 3 vessel CABG in 2004.  Patient reports history of TIA or stroke symptom in 1991 as manifested by garbled speech and amaurosis fugax or monocular blindness as manifested by pages of print appearing blank. The patient denies facial drooping.  Pt. denies hemiplegia. She denies any further TIA or stroke symptoms since 1991. The patient denies tingling, numbness, or weakness in either hand or arm.  In the last 6 months she had her GB removed and benign polyps removed in her esophagus. She is frustrated that she cannot lose weight.   Pt Diabetic: No  Pt smoker: former smoker, quit early 1990's   Pt meds include:  Statin : Yes  Betablocker: Yes  ASA: Yes  Other anticoagulants/antiplatelets: no   Past Medical History  Diagnosis Date  . Hyperlipidemia   . Coronary artery disease   . Hypertension   . PVD (peripheral vascular disease)     endarterectomy by [name]  . Pre-syncope   . Thrombophlebitis     following childbirth  . Cerebrovascular disease     extracranial; occlusive  . Ulcer   . Dizziness   . Chicken pox   . Depression   . Fainting spell   . Allergy     hay fever  . Anemia   . DVT (deep venous thrombosis)   . Heart murmur     Social History History  Substance Use Topics  . Smoking status: Former Smoker -- 0.30 packs/day for 15 years    Types: Cigarettes    Quit date: 08/31/1996  . Smokeless tobacco: Never Used  . Alcohol Use: No    Family History Family History  Problem Relation Age of Onset  . Aneurysm Mother     brain  . Heart disease Mother     Aneyursm   . Hyperlipidemia Mother   . Hypertension  Mother   . Heart disease Father   . Cirrhosis Father   . Heart attack Father   . Heart disease Brother     Before age 69  . Aneurysm Brother   . Hypertension Other   . Cancer Sister     lung  . Heart disease Sister     Aneurysm  . Varicose Veins Sister   . Breast cancer Neg Hx   . Colon cancer Neg Hx     Surgical History Past Surgical History  Procedure Laterality Date  . Endarterectomy  2006    [name]  . Septoplasty      with bilateral inferior turbinate reductions  . Appendectomy    . Carotid endarterectomy  1992  . Eye surgery  Feb. 2013    Cataract Left eye  . Breast surgery      saline implants  . Cataract extraction, bilateral  2013  . Upper gi endoscopy    . Cholecystectomy, laparoscopic  04/02/14    Dr. Rochel Brome  . Coronary artery bypass graft  2006    x3 Dr. Prescott Gum  . Total abdominal hysterectomy      Allergies  Allergen Reactions  . Atorvastatin Nausea And Vomiting  . Codeine Nausea And Vomiting  . Demerol  Nausea And Vomiting  . Oxycodone Hcl Other (See Comments)    Hallucinations   . Rosuvastatin Other (See Comments)    HIGHER DOSE-Causes Leg cramps  . Ivp Dye [Iodinated Diagnostic Agents]     Hives, itching  . Shellfish Allergy     Hives, itching  . Xanax [Alprazolam]     Sedation, even at low dose    Current Outpatient Prescriptions  Medication Sig Dispense Refill  . aspirin 81 MG tablet Take 162 mg by mouth daily.     . carvedilol (COREG) 6.25 MG tablet TAKE 1 TABLET BY MOUTH TWICE A DAY WITH FOOD 120 tablet 3  . furosemide (LASIX) 20 MG tablet Take 1 tablet (20 mg total) by mouth daily as needed for fluid. 90 tablet 3  . hydrochlorothiazide (HYDRODIURIL) 50 MG tablet TAKE 1 TABLET (50 MG TOTAL) BY MOUTH DAILY. 90 tablet 3  . KLOR-CON M10 10 MEQ tablet Take 10 mEq by mouth every other day.     . ranitidine (ZANTAC) 150 MG tablet Take 1 tablet (150 mg total) by mouth 2 (two) times daily as needed for heartburn (or abdominal pain).     . rosuvastatin (CRESTOR) 10 MG tablet Take 10 mg by mouth daily.     . sucralfate (CARAFATE) 1 GM/10ML suspension Take 10 mLs (1 g total) by mouth 4 (four) times daily -  with meals and at bedtime. 420 mL 1  . traZODone (DESYREL) 50 MG tablet Take 0.5-1 tablets (25-50 mg total) by mouth at bedtime. 30 tablet 1   No current facility-administered medications for this visit.    Review of Systems : See HPI for pertinent positives and negatives.  Physical Examination  Filed Vitals:   07/03/14 1413 07/03/14 1416  BP: 154/61 144/61  Pulse: 49 50  Resp:  14  Height:  5\' 2"  (1.575 m)  Weight:  159 lb (72.122 kg)  SpO2:  99%   Body mass index is 29.07 kg/(m^2).  General: WDWN female in NAD  GAIT: normal  Eyes: PERRLA  Pulmonary: CTAB, Negative Rales, Negative rhonchi, & Negative wheezing.  Cardiac: regular Rhythm , no detected murmur.   VASCULAR EXAM  Carotid Bruits  Left  Right    positive  positive  Aorta is not palpable.  Radial pulses are 2+ palpable and equal.   LE Pulses  LEFT  RIGHT   POPLITEAL  not palpable  not palpable   POSTERIOR TIBIAL  palpable  palpable   DORSALIS PEDIS  ANTERIOR TIBIAL  palpable  palpable    Gastrointestinal: soft, nontender, BS WNL, no r/g, no palpated masses.  Musculoskeletal: Negative muscle atrophy/wasting. M/S 5/5 throughout, Extremities without ischemic changes.  Neurologic: A&O X 3; Appropriate Affect ; SENSATION ;normal;  Speech is normal  CN 2-12 intact, Pain and light touch intact in extremities, Motor exam as listed above.    Non-Invasive Vascular Imaging CAROTID DUPLEX 07/03/2014   CEREBROVASCULAR DUPLEX EVALUATION    INDICATION: Carotid artery stenosis    PREVIOUS INTERVENTION(S): Right carotid endarterectomy 04/21/1991; revised 02/13/2005 due to pseudoaneurysm.    DUPLEX EXAM:     RIGHT  LEFT  Peak Systolic Velocities (cm/s) End Diastolic Velocities (cm/s) Plaque LOCATION Peak Systolic  Velocities (cm/s) End Diastolic Velocities (cm/s) Plaque  118 18  CCA PROXIMAL 105 15   97 21 HM CCA MID 106 19   66 14 HM CCA DISTAL 98 17 HT  NV NV NV ECA 130 11 HT  76 15 HM ICA PROXIMAL 518 122 HT  109 28  ICA MID 132 29   130 31  ICA DISTAL 123 33     Carotid endarterectomy /revision ICA / CCA Ratio (PSV) 4.9  Antegrade Vertebral Flow Antegrade  381 Brachial Systolic Pressure (mmHg) 017  Triphasic Brachial Artery Waveforms Triphasic    Plaque Morphology:  HM = Homogeneous, HT = Heterogeneous, CP = Calcific Plaque, SP = Smooth Plaque, IP = Irregular Plaque     ADDITIONAL FINDINGS:     IMPRESSION: Right internal carotid artery is patent with history of carotid endarterectomy, hyperplasia versus soft plaque present at the proximal and mid/distal endarterectomized segment suggestive of stenosis less than 40%. Left internal carotid artery stenosis present in the 80%-99% range.    Compared to the previous exam:  Stable on the right and increase on the left since previous study on 12/26/2013.      Assessment: Catherine Green is a 73 y.o. female who presents with asymptomatic minimal right ICA stenosis s/p right CEA and 80-99% left ICA stenosis. Stable on the right and increase on the left since previous study on 12/26/2013. She has not had any stroke nor TIA symptoms since 1991.  Plan: [name] spoke with and examined patient. She will be scheduled for left CEA.  I discussed in depth with the patient the nature of atherosclerosis, and emphasized the importance of maximal medical management including strict control of blood pressure, blood glucose, and lipid levels, obtaining regular exercise, and continued cessation of smoking.  The patient is aware that without maximal medical management the underlying atherosclerotic disease process will progress, limiting the benefit of any interventions. The patient was given information about stroke prevention and what symptoms should prompt  the patient to seek immediate medical care. Thank you for allowing [place] to participate in this patient's care.  Clemon Chambers, RN, MSN, FNP-C [facility] Office: [phone]  Clinic Physician: Early  07/03/2014 1:58 PM

## 2014-07-20 NOTE — Interval H&P Note (Signed)
History and Physical Interval Note:  07/20/2014 9:09 AM  Catherine Green  has presented today for surgery, with the diagnosis of Left Internal Carotid Artery Stenosis 165.22  The various methods of treatment have been discussed with the patient and family. After consideration of risks, benefits and other options for treatment, the patient has consented to  Procedure(s): ENDARTERECTOMY CAROTID-LEFT (Left) as a surgical intervention .  The patient's history has been reviewed, patient examined, no change in status, stable for surgery.  I have reviewed the patient's chart and labs.  Questions were answered to the patient's satisfaction.     [name]

## 2014-07-20 NOTE — Anesthesia Preprocedure Evaluation (Addendum)
Anesthesia Evaluation  Patient identified by MRN, date of birth, ID band Patient awake    Reviewed: Allergy & Precautions, H&P , NPO status , Patient's Chart, lab work & pertinent test results  History of Anesthesia Complications (+) PONV  Airway Mallampati: III  TM Distance: >3 FB Neck ROM: Full    Dental  (+) Teeth Intact, Dental Advisory Given   Pulmonary former smoker,          Cardiovascular hypertension, + CAD, + CABG and + Peripheral Vascular Disease     Neuro/Psych Anxiety Depression negative neurological ROS     GI/Hepatic Neg liver ROS, GERD-  ,  Endo/Other  negative endocrine ROS  Renal/GU negative Renal ROS     Musculoskeletal negative musculoskeletal ROS (+)   Abdominal   Peds  Hematology negative hematology ROS (+)   Anesthesia Other Findings   Reproductive/Obstetrics                            Anesthesia Physical Anesthesia Plan  ASA: III  Anesthesia Plan: General   Post-op Pain Management:    Induction: Intravenous  Airway Management Planned: Oral ETT  Additional Equipment: Arterial line  Intra-op Plan:   Post-operative Plan: Extubation in OR  Informed Consent: I have reviewed the patients History and Physical, chart, labs and discussed the procedure including the risks, benefits and alternatives for the proposed anesthesia with the patient or authorized representative who has indicated his/her understanding and acceptance.   Dental advisory given  Plan Discussed with: CRNA and Surgeon  Anesthesia Plan Comments:         Anesthesia Quick Evaluation

## 2014-07-20 NOTE — Op Note (Signed)
     Patient name: Catherine Green MRN: [id number] DOB: 01-20-1941 Sex: female  07/20/2014 Pre-operative Diagnosis: Asymptomatic left carotid stenosis Post-operative diagnosis:  Same Surgeon:  Rosetta Posner, M.D. Assistants:Collins Procedure:    left carotid Endarterectomy with Dacron patch angioplasty Anesthesia:  General Blood Loss:  See anesthesia record Specimens:  Carotid Plaque to pathology  Indications for surgery:  asymptomatic  Procedure in detail:  The patient was taken to the operating and placed in the supine position. The neck was prepped and draped in the usual sterile fashion. An incision was made anterior to the sternocleidomastoid muscle and continued with electrocautery through the platysma muscle. The muscle was retracted posteriorly and the carotid sheath was opened. The facial vein was ligated with 2-0 silk ties and divided. The common carotid artery was encircled with an umbilical tape and Rummel tourniquet. Dissection was continued onto the carotid bifurcation. The superior thyroid artery was controlled with a 2-0 silk Potts tie. The external carotid organ was encircled with a vessel loop and the internal carotid was encircled with umbilical tape and Rummel tourniquet. The hypoglossal and vagus nerves were identified and preserved.  The patient was given systemic heparinization. After adequate circulation time, the internal,external and common carotid arteries were occluded. The common carotid was opened with an 11 blade and the arteriotomy was continued with Potts scissors onto the internal carotid artery. A 10 shunt was passed up the internal carotid artery, allowed to back bleed, and then passed down the common carotid artery. The shunt was secured with Rummel tourniquet. The endarterectomy was begun on the common carotid artery  plaque was divided proximally with Potts scissors. The endarterectomy was continued onto the carotid bifurcation. The external carotid was  endarterectomized by eversion technique and the internal carotid artery was endarterectomized in an open fashion. Remaining debris was removed from the endarterectomy plane. A Dacron patch was brought to the field and sewn as a patch angioplasty. Prior to completing the anastomosis, the shunt was removed and the usual flushing maneuvers were undertaken. The anastomosis was then completed and flow was restored first to the external and then the internal carotid artery. Excellent flow characteristics were noted with hand-held Doppler in the internal and external carotid arteries.  The patient was given protamine to reverse the heparin. Hemostasis was obtained with electrocautery. The wounds were irrigated with saline. The wound was closed by first reapproximating the sternocleidomastoid muscle over the carotid artery with interrupted 3-0 Vicryl sutures. Next, the platysma was closed with a running 3-0 Vicryl suture. The skin was closed with a 4-0 subcuticular Vicryl suture. Benzoin and Steri-Strips were applied to the incision. A sterile dressing was placed over the incision. All sponge and needle counts were correct. The patient was awakened in the operating room, neurologically intact. They were transferred to the PACU in stable condition.  Carotid stenosis at surgery:>80%  Disposition:  To PACU in stable condition,neurologically intact  Relevant Operative Details:  Focal stenosis  Rosetta Posner, M.D. [facility] [place] Office: [phone] Pager:  [phone]

## 2014-07-20 NOTE — Progress Notes (Signed)
       Patient is comfortable no tongue deviation, smile is symmetrical.  S/P left Carotid endarterectomy

## 2014-07-20 NOTE — Progress Notes (Signed)
Report  To Irish Elders RN as primary caregiver.

## 2014-07-20 NOTE — Telephone Encounter (Signed)
-----   Message from Mena Goes, RN sent at 07/20/2014  3:32 PM EST ----- Regarding: Schedule   ----- Message -----    From: Ulyses Amor, PA-C    Sent: 07/20/2014   2:13 PM      To: [facility] Charge Pool  Carotid endart. [name] f/u in 2 weeks. thx

## 2014-07-20 NOTE — Transfer of Care (Signed)
Immediate Anesthesia Transfer of Care Note  Patient: Catherine Green  Procedure(s) Performed: Procedure(s): ENDARTERECTOMY CAROTID-LEFT (Left)  Patient Location: PACU  Anesthesia Type:General  Level of Consciousness: awake, alert , oriented and patient cooperative  Airway & Oxygen Therapy: Patient Spontanous Breathing and Patient connected to face mask oxygen  Post-op Assessment: Report given to PACU RN, Post -op Vital signs reviewed and stable, Patient moving all extremities X 4 and Patient able to stick tongue midline  Post vital signs: Reviewed and stable  Complications: No apparent anesthesia complications

## 2014-07-20 NOTE — Anesthesia Postprocedure Evaluation (Signed)
  Anesthesia Post-op Note  Patient: Catherine Green  Procedure(s) Performed: Procedure(s): ENDARTERECTOMY CAROTID-LEFT (Left)  Patient Location: PACU  Anesthesia Type:General  Level of Consciousness: awake, alert  and oriented  Airway and Oxygen Therapy: Patient Spontanous Breathing  Post-op Pain: none  Post-op Assessment: Post-op Vital signs reviewed  Post-op Vital Signs: Reviewed  Last Vitals:  Filed Vitals:   07/20/14 1530  BP: 126/38  Pulse:   Temp:   Resp: 15    Complications: No apparent anesthesia complications

## 2014-07-21 LAB — CBC
HCT: 33 % — ABNORMAL LOW (ref 36.0–46.0)
Hemoglobin: 10.6 g/dL — ABNORMAL LOW (ref 12.0–15.0)
MCH: 26.9 pg (ref 26.0–34.0)
MCHC: 32.1 g/dL (ref 30.0–36.0)
MCV: 83.8 fL (ref 78.0–100.0)
Platelets: 270 10*3/uL (ref 150–400)
RBC: 3.94 MIL/uL (ref 3.87–5.11)
RDW: 13.7 % (ref 11.5–15.5)
WBC: 6.4 10*3/uL (ref 4.0–10.5)

## 2014-07-21 LAB — BASIC METABOLIC PANEL
Anion gap: 14 (ref 5–15)
BUN: 9 mg/dL (ref 6–23)
CO2: 29 mEq/L (ref 19–32)
Calcium: 8.5 mg/dL (ref 8.4–10.5)
Chloride: 98 mEq/L (ref 96–112)
Creatinine, Ser: 0.8 mg/dL (ref 0.50–1.10)
GFR calc Af Amer: 82 mL/min — ABNORMAL LOW (ref >90)
GFR calc non Af Amer: 71 mL/min — ABNORMAL LOW (ref >90)
Glucose, Bld: 107 mg/dL — ABNORMAL HIGH (ref 70–99)
Potassium: 3.3 mEq/L — ABNORMAL LOW (ref 3.7–5.3)
Sodium: 141 mEq/L (ref 137–147)

## 2014-07-21 MED ORDER — OXYCODONE-ACETAMINOPHEN 5-325 MG PO TABS: 1.0000 | ORAL_TABLET | ORAL | Status: DC | PRN

## 2014-07-21 NOTE — Discharge Summary (Signed)
[facility] Discharge Summary  Catherine Green [birth year] 73 y.o. female  [id number]  Admission Date: 07/20/2014  Discharge Date: 07/21/2014  Physician: Curt Jews, MD  Admission Diagnosis: Left Internal Carotid Artery Stenosis 165.22   HPI:   This is a 73 y.o. female of [name] who is status post right CEA in 1992 with a revision in 2006. Her cholesterol is high despite taking Crestor, does not have diabetes that she is aware, is physically active, does not smoke. Exercises 30 minutes daily. Had 3 vessel CABG in 2004.   Patient reports history of TIA or stroke symptom in 1991 as manifested by garbled speech and amaurosis fugax or monocular blindness as manifested by pages of print appearing blank. The patient denies facial drooping. Pt. denies hemiplegia. She denies any further TIA or stroke symptoms since 1991.The patient denies tingling, numbness, or weakness in either hand or arm.  In the last 6 months she had her GB removed and benign polyps removed in her esophagus. She is frustrated that she cannot lose weight.    Hospital Course:  The patient was admitted to the hospital and taken to the operating room on 07/20/2014 and underwent left carotid endarterectomy.  The patient tolerated the procedure well and was transported to the PACU in stable condition.  By POD 1, the patient's neuro status was intact. Her incision was healing well without hematoma. She was ambulating and voiding with difficulty. She was able to increase her intake of solids without difficulty. She was discharged home on POD 1 in good condition.     Recent Labs  07/21/14 0450  NA 141  K 3.3*  CL 98  CO2 29  GLUCOSE 107*  BUN 9  CALCIUM 8.5    Recent Labs  07/20/14 2145 07/21/14 0450  WBC 7.0 6.4  HGB 10.5* 10.6*  HCT 32.2* 33.0*  PLT 254 270   No results for input(s): INR in the last 72 hours.  Discharge Instructions:   The patient is discharged to home with  extensive instructions on wound care and progressive ambulation.  They are instructed not to drive or perform any heavy lifting until returning to see the physician in his office.  Discharge Instructions    Call MD for:  redness, tenderness, or signs of infection (pain, swelling, bleeding, redness, odor or green/yellow discharge around incision site)    Complete by:  As directed      Call MD for:  severe or increased pain, loss or decreased feeling  in affected limb(s)    Complete by:  As directed      Call MD for:  temperature >100.5    Complete by:  As directed      Discharge instructions    Complete by:  As directed   You may shower in 24 hours from surgery.     Driving Restrictions    Complete by:  As directed   No driving for 2 weeks     Increase activity slowly    Complete by:  As directed   Walk with assistance use walker or cane as needed     Lifting restrictions    Complete by:  As directed   No lifting for 6 weeks     Resume previous diet    Complete by:  As directed            Discharge Diagnosis:  Left Internal Carotid Artery Stenosis 165.22  Secondary Diagnosis: Patient Active Problem List   Diagnosis  Date Noted  . Carotid stenosis 07/03/2014  . Aftercare following surgery of the circulatory system 07/03/2014  . Abdominal pain, epigastric 07/01/2014  . Preoperative cardiovascular examination 03/07/2014  . Abdominal pain, right upper quadrant 02/05/2014  . Aftercare following surgery of the circulatory system, [place] 12/26/2013  . Dysphagia, pharyngoesophageal phase 12/25/2013  . Acute on chronic diastolic CHF (congestive heart failure) 11/23/2013  . Palpitations 11/23/2013  . Urticaria 04/05/2013  . Screening for colon cancer 05/27/2012  . Hyperlipidemia 05/04/2012  . Anxiety 03/10/2012  . Occlusion and stenosis of carotid artery without mention of cerebral infarction 12/22/2011  . Other and unspecified hyperlipidemia 10/08/2009  . HYPERTENSION 10/08/2009  .  CAD, ARTERY BYPASS GRAFT 10/08/2009  . PVD 10/08/2009  . SYNCOPE, HX OF 10/08/2009  . THROMBOPHLEBITIS, HX OF 10/08/2009   Past Medical History  Diagnosis Date  . Hyperlipidemia   . Coronary artery disease   . Hypertension   . PVD (peripheral vascular disease)     endarterectomy by [name]  . Pre-syncope   . Thrombophlebitis     following childbirth  . Cerebrovascular disease     extracranial; occlusive  . Ulcer   . Dizziness   . Chicken pox   . Depression   . Fainting spell   . Allergy     hay fever  . Anemia   . DVT (deep venous thrombosis)   . Heart murmur   . PONV (postoperative nausea and vomiting)     severe nausea and vomiting  . Shortness of breath dyspnea     wth ambulation at times  . Dizziness   . Anxiety   . GERD (gastroesophageal reflux disease)   . Swelling of both ankles     and abdomen; takes Lasix when needed  . Seasonal allergies   . Headache       Medication List    TAKE these medications        aspirin 81 MG tablet  Take 162 mg by mouth daily.     carvedilol 6.25 MG tablet  Commonly known as:  COREG  TAKE 1 TABLET BY MOUTH TWICE A DAY WITH FOOD     cetirizine 10 MG tablet  Commonly known as:  ZYRTEC  Take 10 mg by mouth daily as needed for allergies.     furosemide 20 MG tablet  Commonly known as:  LASIX  Take 1 tablet (20 mg total) by mouth daily as needed for fluid.     hydrochlorothiazide 50 MG tablet  Commonly known as:  HYDRODIURIL  TAKE 1 TABLET (50 MG TOTAL) BY MOUTH DAILY.     KLOR-CON M10 10 MEQ tablet  Generic drug:  potassium chloride  Take 10 mEq by mouth every other day.     oxyCODONE-acetaminophen 5-325 MG per tablet  Commonly known as:  PERCOCET/ROXICET  Take 1-2 tablets by mouth every 4 (four) hours as needed for moderate pain.     pantoprazole 20 MG tablet  Commonly known as:  PROTONIX  Take 20 mg by mouth daily as needed.     ranitidine 150 MG tablet  Commonly known as:  ZANTAC  Take 1 tablet (150 mg  total) by mouth 2 (two) times daily as needed for heartburn (or abdominal pain).     rosuvastatin 10 MG tablet  Commonly known as:  CRESTOR  Take 10 mg by mouth daily.     sucralfate 1 GM/10ML suspension  Commonly known as:  CARAFATE  Take 10 mLs (1 g total) by  mouth 4 (four) times daily -  with meals and at bedtime.     traZODone 50 MG tablet  Commonly known as:  DESYREL  Take 0.5-1 tablets (25-50 mg total) by mouth at bedtime.       Percocet #15 No Refill  Disposition: Home  Patient's condition: is Good  Follow up: 1. Dr.  Donnetta Hutching in 2 weeks.   Virgina Jock, PA-C [facility] [phone]  --- For [facility] use --- Instructions: Press F2 to tab through selections.  Delete question if not applicable.   Modified Rankin score at D/C (0-6): 0  IV medication needed for:  1. Hypertension: No 2. Hypotension: No  Post-op Complications: No  1. Post-op CVA or TIA: No  2. CN injury: No  3. Myocardial infarction: No  4.  CHF: No  5.  Dysrhythmia (new): No  6. Wound infection: No  7. Reperfusion symptoms: No  8. Return to OR: No   Discharge medications: Statin use:  Yes If No: [ ]  For Medical reasons, [ ]  Non-compliant, [ ]  Not-indicated ASA use:  Yes  If No: [ ]  For Medical reasons, [ ]  Non-compliant, [ ]  Not-indicated Beta blocker use:  Yes If No: [ ]  For Medical reasons, [ ]  Non-compliant, [ ]  Not-indicated ACE-Inhibitor use:  No If No: [ ]  For Medical reasons, [ ]  Non-compliant, [x ] Not-indicated P2Y12 Antagonist use: No, [ ]  Plavix, [ ]  Plasugrel, [ ]  Ticlopinine, [ ]  Ticagrelor, [ ]  Other, [ ]  No for medical reason, [ ]  Non-compliant, [x ] Not-indicated Anti-coagulant use:  No, [ ]  Warfarin, [ ]  Rivaroxaban, [ ]  Dabigatran, [ ]  Other, [ ]  No for medical reason, [ ]  Non-compliant, [x]  Not-indicated

## 2014-07-21 NOTE — Plan of Care (Signed)
Problem: Phase I Progression Outcomes Goal: Vascular site scale level 0 - I Vascular Site Scale Level 0: No bruising/bleeding/hematoma Level I (Mild): Bruising/Ecchymosis, minimal bleeding/ooozing, palpable hematoma < 3 cm Level II (Moderate): Bleeding not affecting hemodynamic parameters, pseudoaneurysm, palpable hematoma > 3 cm Level III (Severe) Bleeding which affects hemodynamic parameters or retroperitoneal hemorrhage  Outcome: Progressing

## 2014-07-21 NOTE — Progress Notes (Signed)
Discharge instructions given. No questions or concerns at this time. IV d/c'd. Tip intact. Pt tolerated well.  

## 2014-07-21 NOTE — Progress Notes (Addendum)
  [facility] Progress Note  07/21/2014 7:49 AM 1 Day Post-Op  Subjective:  Doing well this morning. Swallowing fine. Ready to go home.     Filed Vitals:   07/21/14 0429  BP: 133/36  Pulse: 57  Temp: 98.1 F (36.7 C)  Resp: 10     Physical Exam: Neuro:  No smile asymmetry, no tongue deviation. 5/5 grip strength bilaterally. 5/5 lower extremities b/l Incision:  Left neck incision clean dry and intact. No hematoma.   CBC    Component Value Date/Time   WBC 6.4 07/21/2014 0450   RBC 3.94 07/21/2014 0450   HGB 10.6* 07/21/2014 0450   HCT 33.0* 07/21/2014 0450   PLT 270 07/21/2014 0450   MCV 83.8 07/21/2014 0450   MCH 26.9 07/21/2014 0450   MCHC 32.1 07/21/2014 0450   RDW 13.7 07/21/2014 0450   LYMPHSABS 1.9 02/05/2014 0951   MONOABS 0.5 02/05/2014 0951   EOSABS 0.0 02/05/2014 0951   BASOSABS 0.0 02/05/2014 0951    BMET    Component Value Date/Time   NA 141 07/21/2014 0450   NA 138 05/08/2013 0936   K 3.3* 07/21/2014 0450   CL 98 07/21/2014 0450   CO2 29 07/21/2014 0450   GLUCOSE 107* 07/21/2014 0450   GLUCOSE 110* 05/08/2013 0936   BUN 9 07/21/2014 0450   BUN 14 05/08/2013 0936   CREATININE 0.80 07/21/2014 0450   CALCIUM 8.5 07/21/2014 0450   GFRNONAA 71* 07/21/2014 0450   GFRAA 82* 07/21/2014 0450     Intake/Output Summary (Last 24 hours) at 07/21/14 0749 Last data filed at 07/21/14 0500  Gross per 24 hour  Intake   2920 ml  Output   1025 ml  Net   1895 ml      Assessment/Plan:  This is a 73 y.o. female who is s/p left CEA 1 Day Post-Op  -Patient is doing well this am. -Neuro exam is intact -Has ambulated -Has voided -Is on ASA and statin.  -Will discharge home. F/u in two weeks with [name].    Virgina Jock, PA-C [facility] Office: [phone] Pager: [phone] 07/21/2014 7:49 AM  Agree with above.  Neuro intact. D/C today.  Deitra Mayo, MD, [place] [phone] 07/21/2014

## 2014-07-24 ENCOUNTER — Encounter (HOSPITAL_COMMUNITY): Payer: Self-pay | Admitting: Vascular Surgery

## 2014-08-01 ENCOUNTER — Encounter (HOSPITAL_COMMUNITY): Payer: Self-pay | Admitting: Cardiology

## 2014-08-06 ENCOUNTER — Encounter: Payer: Self-pay | Admitting: Vascular Surgery

## 2014-08-07 ENCOUNTER — Ambulatory Visit (INDEPENDENT_AMBULATORY_CARE_PROVIDER_SITE_OTHER): Payer: Commercial Managed Care - HMO | Admitting: Vascular Surgery

## 2014-08-07 ENCOUNTER — Encounter: Payer: Self-pay | Admitting: Vascular Surgery

## 2014-08-07 VITALS — BP 110/82 | HR 64 | Resp 16 | Ht 62.0 in | Wt 153.0 lb

## 2014-08-07 DIAGNOSIS — I6523 Occlusion and stenosis of bilateral carotid arteries: Secondary | ICD-10-CM

## 2014-08-07 NOTE — Addendum Note (Signed)
Addended by: Mena Goes on: 08/07/2014 02:38 PM   Modules accepted: Orders

## 2014-08-07 NOTE — Progress Notes (Signed)
Here today for follow-up of her left carotid endarterectomy for severe asymptomatic disease on 07/20/2014. Her wound is healing nicely and she has had no focal neurologic deficits. Since surgery she has had persistent nausea and vomiting and dizziness as well. She does have a history of persistent nausea for several weeks following anesthesia and this appears to be the case currently.  On physical exam her blood pressure stable 155/78 heart rate is 55-60 She has no focal neurologic deficits. Left neck incision is well-healed and she has no bruits bilaterally  Impression and plan stable following surgery. She does have dizziness and some postoperative nausea and vomiting. I do not feel this is related to her endarterectomy specifically. Her vertebral artery flow was normal antegrade bilaterally with normal triphasic brachial flow preop. She was reassured with this. If she has persistent difficulty she will let us know. I suspect that this will resolve as it has in the past spontaneously. This with P carotid duplex

## 2014-08-08 ENCOUNTER — Other Ambulatory Visit: Payer: Self-pay | Admitting: Family Medicine

## 2014-08-08 NOTE — Telephone Encounter (Signed)
Received refill request electronically from pharmacy. Last refill 06/14/14 #30/1. Last office visit 06/29/14. Is it okay to refill medication?

## 2014-08-09 NOTE — Telephone Encounter (Signed)
Sent. Thanks.   

## 2014-09-03 ENCOUNTER — Ambulatory Visit (INDEPENDENT_AMBULATORY_CARE_PROVIDER_SITE_OTHER): Payer: Commercial Managed Care - HMO | Admitting: Family Medicine

## 2014-09-03 ENCOUNTER — Encounter: Payer: Self-pay | Admitting: Family Medicine

## 2014-09-03 VITALS — BP 146/64 | HR 61 | Temp 98.3°F | Wt 159.5 lb

## 2014-09-03 DIAGNOSIS — R42 Dizziness and giddiness: Secondary | ICD-10-CM

## 2014-09-03 DIAGNOSIS — H8110 Benign paroxysmal vertigo, unspecified ear: Secondary | ICD-10-CM

## 2014-09-03 LAB — BASIC METABOLIC PANEL WITH GFR
BUN: 15 mg/dL (ref 6–23)
CO2: 33 meq/L — ABNORMAL HIGH (ref 19–32)
Calcium: 9.4 mg/dL (ref 8.4–10.5)
Chloride: 95 meq/L — ABNORMAL LOW (ref 96–112)
Creatinine, Ser: 0.7 mg/dL (ref 0.4–1.2)
GFR: 84.05 mL/min
Glucose, Bld: 95 mg/dL (ref 70–99)
Potassium: 3.1 meq/L — ABNORMAL LOW (ref 3.5–5.1)
Sodium: 137 meq/L (ref 135–145)

## 2014-09-03 LAB — CBC WITH DIFFERENTIAL/PLATELET
Basophils Absolute: 0 10*3/uL (ref 0.0–0.1)
Basophils Relative: 0.5 % (ref 0.0–3.0)
Eosinophils Absolute: 0 10*3/uL (ref 0.0–0.7)
Eosinophils Relative: 0.7 % (ref 0.0–5.0)
HCT: 36.8 % (ref 36.0–46.0)
Hemoglobin: 11.8 g/dL — ABNORMAL LOW (ref 12.0–15.0)
Lymphocytes Relative: 32.9 % (ref 12.0–46.0)
Lymphs Abs: 2 10*3/uL (ref 0.7–4.0)
MCHC: 32 g/dL (ref 30.0–36.0)
MCV: 83.9 fl (ref 78.0–100.0)
Monocytes Absolute: 0.6 10*3/uL (ref 0.1–1.0)
Monocytes Relative: 9.1 % (ref 3.0–12.0)
Neutro Abs: 3.5 10*3/uL (ref 1.4–7.7)
Neutrophils Relative %: 56.8 % (ref 43.0–77.0)
Platelets: 338 10*3/uL (ref 150.0–400.0)
RBC: 4.38 Mil/uL (ref 3.87–5.11)
RDW: 15.3 % (ref 11.5–15.5)
WBC: 6.2 10*3/uL (ref 4.0–10.5)

## 2014-09-03 MED ORDER — MECLIZINE HCL 25 MG PO TABS: 12.5000 mg | ORAL_TABLET | Freq: Three times a day (TID) | ORAL | Status: DC | PRN

## 2014-09-03 MED ORDER — HYDROCHLOROTHIAZIDE 50 MG PO TABS: 25.0000 mg | ORAL_TABLET | Freq: Every day | ORAL | Status: DC

## 2014-09-03 NOTE — Progress Notes (Signed)
Pre visit review using our clinic review tool, if applicable. No additional management support is needed unless otherwise documented below in the visit note.  Sx got worse after her surgery.  Still with nausea and vertigo with position change, worse after surgery.  Sx worse with head turning.  She can get lightheaded with standing, but that is a different sensation.    She had similar sx after her last surgery, but she didn't recall the timeline.  "I'm not me now, everything bothers me."  No SI/HI.  No vomiting usually, occ some vomiting when the vertigo sx are worst.   She is fatigued.   No focal neuro changes.  Her neck is healed but still numb from her L CEA.   Meds, vitals, and allergies reviewed.   ROS: See HPI.  Otherwise, noncontributory.  GEN: nad, alert and oriented HEENT: mucous membranes moist, tm wnl, nasal and OP exam wnl NECK: supple w/o LA, healed scar noted.  CV: rrr. PULM: ctab, no inc wob ABD: soft, +bs, not ttp EXT: no edema SKIN: no acute rash Vertigo sx with head turning (worse with quick movements) CN 2-12 wnl S/S wnl for the ext 4

## 2014-09-03 NOTE — Patient Instructions (Signed)
Go to the lab on the way out.  We'll contact you with your lab report.  Cut the HCTZ back to 25mg  a day (1/2 tab).  If still lightheaded on standing, then cut it out totally for a few days. Use meclizine for the vertigo if needed along with the bedside exercise.  Update me in a few days.   Take care.  Glad to see you.

## 2014-09-04 DIAGNOSIS — R42 Dizziness and giddiness: Secondary | ICD-10-CM | POA: Insufficient documentation

## 2014-09-04 DIAGNOSIS — H8110 Benign paroxysmal vertigo, unspecified ear: Secondary | ICD-10-CM | POA: Insufficient documentation

## 2014-09-04 NOTE — Assessment & Plan Note (Addendum)
Likely with sx reproduced with head position changes.  D/w pt.  Use home exercises and prn meclizine, should resolve.   She'll update me as needed.  The abd sx are like from the BPV, not from a true abdominal primary source.  >25 minutes spent in face to face time with patient, >50% spent in counselling or coordination of care.

## 2014-09-04 NOTE — Assessment & Plan Note (Signed)
Likely a separate issue, will cut her HCTZ back in the meantime and she'll update me as needed.

## 2014-09-10 ENCOUNTER — Telehealth: Payer: Self-pay

## 2014-09-10 MED ORDER — CARVEDILOL 6.25 MG PO TABS: 3.1250 mg | ORAL_TABLET | Freq: Two times a day (BID) | ORAL | Status: DC

## 2014-09-10 NOTE — Telephone Encounter (Signed)
Patient advised.

## 2014-09-10 NOTE — Telephone Encounter (Signed)
Pt was seen 09/03/2014; pt still having vertigo and nausea which is worse upon movement. If pt is completely still she does not have dizziness. Pt has stopped HCTZ and is taking meclizine which seems to help slightly.  BP averaging 126/50. Today BP is 123/49. [facility] request cb.

## 2014-09-10 NOTE — Telephone Encounter (Signed)
Her BP is still low, so I would cut the carvedilol back to 3.125mg  BID.  That would be a half tab twice a day, since she has 6.25mg  tabs.   See if that helps.  Update [place] as needed. Thanks.

## 2014-10-27 ENCOUNTER — Other Ambulatory Visit: Payer: Self-pay | Admitting: Cardiovascular Disease

## 2014-11-05 ENCOUNTER — Telehealth: Payer: Self-pay | Admitting: Family Medicine

## 2014-11-05 ENCOUNTER — Ambulatory Visit (INDEPENDENT_AMBULATORY_CARE_PROVIDER_SITE_OTHER): Payer: Commercial Managed Care - HMO | Admitting: Family Medicine

## 2014-11-05 ENCOUNTER — Encounter: Payer: Self-pay | Admitting: Family Medicine

## 2014-11-05 VITALS — BP 178/72 | HR 60 | Temp 98.4°F | Wt 157.5 lb

## 2014-11-05 DIAGNOSIS — M545 Low back pain, unspecified: Secondary | ICD-10-CM

## 2014-11-05 MED ORDER — CYCLOBENZAPRINE HCL 10 MG PO TABS: 5.0000 mg | ORAL_TABLET | Freq: Three times a day (TID) | ORAL | Status: DC | PRN

## 2014-11-05 NOTE — Progress Notes (Signed)
Pre visit review using our clinic review tool, if applicable. No additional management support is needed unless otherwise documented below in the visit note.  Back pain.  Started in the R hip area, now across the lower back.  "Electrical shock" in the back with occ tingling below the knees, on each side.  No thigh pain.  Started about 2-3 weeks ago, no trauma, getting gradually worse in the meantime.  She has more pain if she moves quickly.  She can still walk.  No B/B sx.  No FCNAVD.  No rash.  No sx like this prev.  Taking tylenol for pain, w/o much relief.  She doesn't have typical radicular sx.  Lower back tender in a band B just able the waist line.  No dysuria.    Meds, vitals, and allergies reviewed.   ROS: See HPI.  Otherwise, noncontributory.  nad ncat Uncomfortable Neck supple, no LA rrr ctab abd soft, not ttp Back with B lower L spine paraspinal muscle tenderness w/o rash or bruising, no midline back pain SLR neg, no pain with back ext but sig pain with flexion at the waist.  S/S wnl BLE ext

## 2014-11-05 NOTE — Assessment & Plan Note (Signed)
She doesn't have true radicular sx.  No B/B sx.  No weakness.   Likely with diffuse muscle spasm in the lower back, reasonable to try flexeril prn and heat and gently stretchy.  D/w pt.  She agrees.  She'll update me in a few days.   She can recheck BP out of clinic, up today likely from pain.   Routine cautions on meds.  She agrees.

## 2014-11-05 NOTE — Telephone Encounter (Signed)
Please call pt.  I didn't mention this specifically at the appointment but I want her to check her BP out of clinic and notify me if it stays up.  I think it was up from pain today, and she agreed with that.  Thanks.

## 2014-11-05 NOTE — Telephone Encounter (Signed)
Patient advised and states that if it stays up, she will call.

## 2014-11-05 NOTE — Patient Instructions (Signed)
Use flexeril for your back and keep taking tylenol as needed.  Use a heating pad intermittently, but don't go to sleep on it.  Gently stretch your back over the next few days. Update me in a few days.  Take care.  Glad to see you.

## 2014-11-06 ENCOUNTER — Encounter: Payer: Self-pay | Admitting: Cardiovascular Disease

## 2014-11-06 ENCOUNTER — Ambulatory Visit (INDEPENDENT_AMBULATORY_CARE_PROVIDER_SITE_OTHER): Payer: Commercial Managed Care - HMO | Admitting: Cardiovascular Disease

## 2014-11-06 VITALS — BP 158/72 | HR 62 | Ht 62.0 in | Wt 156.5 lb

## 2014-11-06 DIAGNOSIS — I10 Essential (primary) hypertension: Secondary | ICD-10-CM

## 2014-11-06 DIAGNOSIS — I25810 Atherosclerosis of coronary artery bypass graft(s) without angina pectoris: Secondary | ICD-10-CM

## 2014-11-06 DIAGNOSIS — I6523 Occlusion and stenosis of bilateral carotid arteries: Secondary | ICD-10-CM

## 2014-11-06 DIAGNOSIS — E785 Hyperlipidemia, unspecified: Secondary | ICD-10-CM

## 2014-11-06 NOTE — Assessment & Plan Note (Signed)
Currently with no symptoms of angina. No further workup at this time. Continue current medication regimen. Stress to her the importance of better cholesterol control. Labs drawn today

## 2014-11-06 NOTE — Progress Notes (Signed)
Patient ID: Catherine Green, female    DOB: [birth year], 74 y.o.   MRN: [id number]  HPI Comments: [name] is a very pleasant 74 year old woman with history coronary artery disease, bypass surgery, peripheral vascular disease with history of right carotid endarterectomy, as well as recent left CEA followed by [name], who presents for routine followup of her coronary artery disease. Last catheterization in January 2009 showing patent vein grafts, atretic LIMA to the LAD.     problems with anxiety in the past  In follow-up today, CEA on the left has healed well. She is tolerating Crestor 10 mg daily. No recent lipid panel available She denies any significant shortness of breath or chest pain. She does not take her blood pressure medications in the morning, typically has elevated blood pressure in the office. Reports is well controlled at home systolic pressures in the 130 range. Otherwise has no complaints. Denies any significant leg edema, takes Lasix when necessary  EKG on today's visit shows normal sinus rhythm with rate 62 bpm with no significant ST or T-wave changes  Other past medical history Previous symptoms of shortness of breath and weight gain. Improved with Lasix to take as needed.  She had her gallbladder taken out 03/13/2014.  she had "hair loss" on simvastatin, as well as sores on her head. Lipitor caused leg problems/myalgias.  She is not particularly interested in fenofibrate, WelChol. Too many pills.  She has had problems with zetia.   Previously stopped her "nerve" pill. Significant stressors as husband is sick. She was previously using the treadmill on a regular basis       Allergies  Allergen Reactions  . Atorvastatin Nausea And Vomiting  . Codeine Nausea And Vomiting  . Demerol Nausea And Vomiting  . Oxycodone Hcl Other (See Comments)    Hallucinations   . Rosuvastatin Nausea And Vomiting and Other (See Comments)    HIGHER DOSE-Causes Leg cramps  .  Protonix [Pantoprazole Sodium] Nausea Only  . Ivp Dye [Iodinated Diagnostic Agents]     Hives, itching  . Shellfish Allergy     Hives, itching  . Trazodone And Nefazodone Other (See Comments)    Sedation   . Xanax [Alprazolam]     Sedation, even at low dose    Outpatient Encounter Prescriptions as of 11/06/2014  Medication Sig  . aspirin 81 MG tablet Take 162 mg by mouth daily.   . carvedilol (COREG) 6.25 MG tablet TAKE 1 TABLET BY MOUTH TWICE A DAY WITH FOOD  . CRESTOR 10 MG tablet TAKE 1 TABLET EVERY DAY  . cyclobenzaprine (FLEXERIL) 10 MG tablet Take 0.5-1 tablets (5-10 mg total) by mouth 3 (three) times daily as needed for muscle spasms (sedation caution).  . furosemide (LASIX) 20 MG tablet Take 1 tablet (20 mg total) by mouth daily as needed for fluid.  . hydrochlorothiazide (HYDRODIURIL) 50 MG tablet Take 0.5 tablets (25 mg total) by mouth daily.  Marland Kitchen KLOR-CON M10 10 MEQ tablet TAKE 1 TABLET BY MOUTH EVERY OTHER DAY.  . meclizine (ANTIVERT) 25 MG tablet Take 0.5-1 tablets (12.5-25 mg total) by mouth 3 (three) times daily as needed for dizziness.  . ranitidine (ZANTAC) 150 MG tablet Take 1 tablet (150 mg total) by mouth 2 (two) times daily as needed for heartburn (or abdominal pain).    Past Medical History  Diagnosis Date  . Hyperlipidemia   . Coronary artery disease   . Hypertension   . PVD (peripheral vascular disease)  endarterectomy by [name]  . Pre-syncope   . Thrombophlebitis     following childbirth  . Cerebrovascular disease     extracranial; occlusive  . Ulcer   . Dizziness   . Chicken pox   . Depression   . Fainting spell   . Allergy     hay fever  . Anemia   . DVT (deep venous thrombosis)   . Heart murmur   . PONV (postoperative nausea and vomiting)     severe nausea and vomiting  . Shortness of breath dyspnea     wth ambulation at times  . Dizziness   . Anxiety   . GERD (gastroesophageal reflux disease)   . Swelling of both ankles     and  abdomen; takes Lasix when needed  . Seasonal allergies   . Headache   . Carotid artery occlusion     Past Surgical History  Procedure Laterality Date  . Endarterectomy  2006    [name]  . Septoplasty      with bilateral inferior turbinate reductions  . Appendectomy    . Breast surgery      saline implants  . Cataract extraction, bilateral  2013  . Upper gi endoscopy    . Cholecystectomy, laparoscopic  04/02/14    Dr. Rochel Brome  . Coronary artery bypass graft  2006    x3 Dr. Prescott Gum  . Total abdominal hysterectomy    . Cholecystectomy  Oct. 15, 2015    Gall Bladder  . Carotid endarterectomy  1992  . Carotid endarterectomy Right February 13, 2005    Re-do Right CE  . Eye surgery Bilateral Feb. 2013    Cataract Left eye  . Cardiac catheterization    . Upper gastrointestinal endoscopy      had polyps removed from esophagus  . Endarterectomy Left 07/20/2014    Procedure: ENDARTERECTOMY CAROTID-LEFT;  Surgeon: Rosetta Posner, MD;  Location: [facility];  Service: Vascular;  Laterality: Left;    Social History  reports that she quit smoking about 18 years ago. Her smoking use included Cigarettes. She has a 4.5 pack-year smoking history. She has never used smokeless tobacco. She reports that she does not drink alcohol or use illicit drugs.  Family History family history includes Aneurysm in her brother and mother; Birth defects in her brother; Cancer in her sister; Cirrhosis in her father; Deep vein thrombosis in her brother; Heart attack in her father; Heart disease in her brother, father, mother, and sister; Hyperlipidemia in her brother, father, mother, and sister; Hypertension in her brother, father, mother, other, and sister; Peripheral vascular disease in her daughter; Varicose Veins in her sister. There is no history of Breast cancer or Colon cancer.   Review of Systems  Constitutional: Positive for fatigue.  Respiratory: Negative.   Cardiovascular: Negative.    Gastrointestinal: Negative.   Musculoskeletal: Negative.   Skin: Negative.   Neurological: Negative.   Hematological: Negative.   Psychiatric/Behavioral: Negative.   All other systems reviewed and are negative.   BP 158/72 mmHg  Pulse 62  Ht 5\' 2"  (1.575 m)  Wt 156 lb 8 oz (70.988 kg)  BMI 28.62 kg/m2  Physical Exam  Constitutional: She is oriented to person, place, and time. She appears well-developed and well-nourished.  HENT:  Head: Normocephalic.  Nose: Nose normal.  Mouth/Throat: Oropharynx is clear and moist.  Eyes: Conjunctivae are normal. Pupils are equal, round, and reactive to light.  Neck: Normal range of motion. Neck supple.  No JVD present.  Cardiovascular: Normal rate, regular rhythm, S1 normal, S2 normal and intact distal pulses.  Exam reveals no gallop and no friction rub.   Murmur heard.  Systolic murmur is present with a grade of 2/6  Pulmonary/Chest: Effort normal and breath sounds normal. No respiratory distress. She has no wheezes. She has no rales. She exhibits no tenderness.  Abdominal: Soft. Bowel sounds are normal. She exhibits no distension. There is no tenderness.  Musculoskeletal: Normal range of motion. She exhibits no edema or tenderness.  Lymphadenopathy:    She has no cervical adenopathy.  Neurological: She is alert and oriented to person, place, and time. Coordination normal.  Skin: Skin is warm and dry. No rash noted. No erythema.  Psychiatric: She has a normal mood and affect. Her behavior is normal. Judgment and thought content normal.    Assessment and Plan  Nursing note and vitals reviewed.

## 2014-11-06 NOTE — Assessment & Plan Note (Signed)
We will draw her cholesterol today. Cholesterol goals discussed with her. Likely need additional medications. She has been reluctant in the past to take more Crestor or zetia.

## 2014-11-06 NOTE — Patient Instructions (Signed)
You are doing well. No medication changes were made.  We will check lipids today, fasting If it runs high, we may need to increase the crestor and maybe add zetia   Please call us if you have new issues that need to be addressed before your next appt.  Your physician wants you to follow-up in: 6 months.  You will receive a reminder letter in the mail two months in advance. If you don't receive a letter, please call our office to schedule the follow-up appointment.

## 2014-11-06 NOTE — Assessment & Plan Note (Signed)
She reports blood pressure well controlled at home. Typically always elevated in the office. She does not take her blood pressure medications in the morning, typically later in the day. Based on her report, and her insistence that blood pressure is well controlled at home, we have not made any changes. Recommended she closely monitor her blood pressure and call us for systolic pressures running more than 140

## 2014-11-06 NOTE — Assessment & Plan Note (Addendum)
Recent carotid endarterectomy on the left. Poor controlled cholesterol secondary to medication intolerance Followed in [facility]

## 2014-11-07 LAB — HEPATIC FUNCTION PANEL
ALT: 19 IU/L (ref 0–32)
AST: 23 IU/L (ref 0–40)
Albumin: 4.6 g/dL (ref 3.5–4.8)
Alkaline Phosphatase: 87 IU/L (ref 39–117)
Bilirubin Total: 0.5 mg/dL (ref 0.0–1.2)
Bilirubin, Direct: 0.15 mg/dL (ref 0.00–0.40)
Total Protein: 6.9 g/dL (ref 6.0–8.5)

## 2014-11-07 LAB — LIPID PANEL
Chol/HDL Ratio: 3.4 ratio units (ref 0.0–4.4)
Cholesterol, Total: 212 mg/dL — ABNORMAL HIGH (ref 100–199)
HDL: 63 mg/dL (ref >39)
LDL Calculated: 115 mg/dL — ABNORMAL HIGH (ref 0–99)
Triglycerides: 170 mg/dL — ABNORMAL HIGH (ref 0–149)
VLDL Cholesterol Cal: 34 mg/dL (ref 5–40)

## 2014-11-12 ENCOUNTER — Other Ambulatory Visit: Payer: Self-pay | Admitting: Cardiovascular Disease

## 2014-12-01 ENCOUNTER — Other Ambulatory Visit: Payer: Self-pay | Admitting: Family Medicine

## 2014-12-03 NOTE — Telephone Encounter (Signed)
Please verify this with patient, ie if she can tolerate and if she needed a refill.  Thanks.

## 2014-12-03 NOTE — Telephone Encounter (Signed)
Electronic refill request.  Medication not currently on meds list, added for refill purposes from previous history.  See contraindication notice.  Please advise.

## 2014-12-04 NOTE — Telephone Encounter (Signed)
Left message on patient's voicemail to return call

## 2014-12-06 NOTE — Telephone Encounter (Signed)
Left detailed message on voicemail.  

## 2014-12-10 ENCOUNTER — Ambulatory Visit (INDEPENDENT_AMBULATORY_CARE_PROVIDER_SITE_OTHER): Payer: Commercial Managed Care - HMO | Admitting: Family Medicine

## 2014-12-10 ENCOUNTER — Encounter: Payer: Self-pay | Admitting: Family Medicine

## 2014-12-10 VITALS — BP 142/60 | HR 60 | Temp 98.3°F | Wt 161.5 lb

## 2014-12-10 DIAGNOSIS — J069 Acute upper respiratory infection, unspecified: Secondary | ICD-10-CM

## 2014-12-10 MED ORDER — BENZONATATE 200 MG PO CAPS: 200.0000 mg | ORAL_CAPSULE | Freq: Three times a day (TID) | ORAL | Status: DC | PRN

## 2014-12-10 NOTE — Telephone Encounter (Signed)
Patient does not want this refill.

## 2014-12-10 NOTE — Patient Instructions (Signed)
Since your lungs are clear, stop the antibiotics for now, take tessalon for the cough.  Try to get some rest and drink plenty of fluids.  Update me in a few days if needed.

## 2014-12-10 NOTE — Progress Notes (Signed)
Pre visit review using our clinic review tool, if applicable. No additional management support is needed unless otherwise documented below in the visit note.  Sick for about 10 days.  Burning in her throat.  Then cough with sputum.  Cough kept her from sleeping.  Got sick when on a cruise to [place].  No fevers known but had sweats and chills.  Muscles aching, chest sore from coughing.  Had some leftover augmentin, started it about 3 days ago.  She is some better in the meantime, unclear if from medicine or if she was going to improve anyway.  No vomiting, no diarrhea.  Feels somewhat weak.  Some wheeze noted.  She can't tolerate SABA.    Meds, vitals, and allergies reviewed.   ROS: See HPI.  Otherwise, noncontributory.  GEN: nad, alert and oriented HEENT: mucous membranes moist, tm w/o erythema, nasal exam w/o erythema, clear discharge noted,  OP with cobblestoning, sinuses not ttp NECK: supple w/o LA CV: rrr.   PULM: ctab, no inc wob EXT: no edema SKIN: no acute rash

## 2014-12-11 ENCOUNTER — Telehealth: Payer: Self-pay | Admitting: *Deleted

## 2014-12-11 DIAGNOSIS — J069 Acute upper respiratory infection, unspecified: Secondary | ICD-10-CM | POA: Insufficient documentation

## 2014-12-11 MED ORDER — AMOXICILLIN-POT CLAVULANATE 875-125 MG PO TABS: 1.0000 | ORAL_TABLET | Freq: Two times a day (BID) | ORAL | Status: DC

## 2014-12-11 NOTE — Assessment & Plan Note (Signed)
Likely viral, husband sick with similar.  ctab and sinuses not ttp.  Nontoxic.  Use tessalon for cough, update me in a few days.  Stop abx, since likely viral. She agrees.  F/u prn.

## 2014-12-11 NOTE — Addendum Note (Signed)
Addended by: Tonia Ghent on: 12/11/2014 02:01 PM   Modules accepted: Orders

## 2014-12-11 NOTE — Telephone Encounter (Signed)
Patient says she thinks she is going to need the antibiotic.  She feels she is doing worse today than yesterday.

## 2014-12-11 NOTE — Telephone Encounter (Signed)
Tessalon not helping.  Stopped ABX yesterday but went back on it this morning.  Throat is getting sore again and having sweats and chills.

## 2014-12-11 NOTE — Telephone Encounter (Signed)
Patient advised.

## 2014-12-11 NOTE — Telephone Encounter (Signed)
Sent augmentin, the only other think I can think of to try for the cough is OTC robitussin DM.   Thanks.

## 2014-12-11 NOTE — Telephone Encounter (Signed)
Please get any details here- is the cough worse, did the tessalon help, more sputum?  Let me know and I'll work on it.  Thanks.

## 2014-12-21 ENCOUNTER — Ambulatory Visit (INDEPENDENT_AMBULATORY_CARE_PROVIDER_SITE_OTHER): Payer: Commercial Managed Care - HMO | Admitting: Internal Medicine

## 2014-12-21 ENCOUNTER — Encounter: Payer: Self-pay | Admitting: Internal Medicine

## 2014-12-21 VITALS — BP 144/82 | HR 67 | Temp 98.3°F | Resp 20 | Wt 161.0 lb

## 2014-12-21 DIAGNOSIS — J019 Acute sinusitis, unspecified: Secondary | ICD-10-CM | POA: Insufficient documentation

## 2014-12-21 DIAGNOSIS — J0100 Acute maxillary sinusitis, unspecified: Secondary | ICD-10-CM | POA: Diagnosis not present

## 2014-12-21 MED ORDER — CEFUROXIME AXETIL 250 MG PO TABS: 250.0000 mg | ORAL_TABLET | Freq: Two times a day (BID) | ORAL | Status: DC

## 2014-12-21 MED ORDER — BENZONATATE 200 MG PO CAPS: 200.0000 mg | ORAL_CAPSULE | Freq: Three times a day (TID) | ORAL | Status: DC | PRN

## 2014-12-21 NOTE — Progress Notes (Signed)
Pre visit review using our clinic review tool, if applicable. No additional management support is needed unless otherwise documented below in the visit note. 

## 2014-12-21 NOTE — Assessment & Plan Note (Signed)
Sick for a month now No clear lower respiratory symptoms ---other than SOB Will treat with ceftin which she tolerated Benzonatate also helped

## 2014-12-21 NOTE — Progress Notes (Signed)
Subjective:    Patient ID: Catherine Green, female    DOB: 02-Jun-1941, 74 y.o.   MRN: [id number]  HPI Here for ongoing respiratory symptoms  Has gotten worse from this illness Started about 4 weeks ago Got sick on cruise Nephew had antibiotic that seemed to help--but she only had a few to take (ceftin)  Then worsened again Has to sit in chair to sleep  Got diarrhea and pain in stomach from the augmentin Benzonatate helped but she ran out-- sick with robitussin also Taking mucinex--doesn't seem to be helping  Bad nasal drainage-- pale green/yellow Cough day and night--- now clear but was green  Has some chills --hasn't taken temp Some night sweats also DOE--like just taking shower  Current Outpatient Prescriptions on File Prior to Visit  Medication Sig Dispense Refill  . aspirin 81 MG tablet Take 162 mg by mouth daily.     . benzonatate (TESSALON) 200 MG capsule Take 1 capsule (200 mg total) by mouth 3 (three) times daily as needed.    . carvedilol (COREG) 6.25 MG tablet TAKE 1 TABLET BY MOUTH TWICE A DAY WITH FOOD 180 tablet 3  . CRESTOR 10 MG tablet TAKE 1 TABLET EVERY DAY 90 tablet 3  . cyclobenzaprine (FLEXERIL) 10 MG tablet Take 0.5-1 tablets (5-10 mg total) by mouth 3 (three) times daily as needed for muscle spasms (sedation caution). 30 tablet 1  . furosemide (LASIX) 20 MG tablet TAKE 1 TABLET (20 MG TOTAL) BY MOUTH DAILY AS NEEDED FOR FLUID. 90 tablet 3  . hydrochlorothiazide (HYDRODIURIL) 50 MG tablet Take 0.5 tablets (25 mg total) by mouth daily.    Marland Kitchen KLOR-CON M10 10 MEQ tablet TAKE 1 TABLET BY MOUTH EVERY OTHER DAY. 90 tablet 3  . meclizine (ANTIVERT) 25 MG tablet Take 0.5-1 tablets (12.5-25 mg total) by mouth 3 (three) times daily as needed for dizziness.    . ranitidine (ZANTAC) 150 MG tablet Take 1 tablet (150 mg total) by mouth 2 (two) times daily as needed for heartburn (or abdominal pain).     No current facility-administered medications on file prior to visit.     Allergies  Allergen Reactions  . Atorvastatin Nausea And Vomiting  . Codeine Nausea And Vomiting  . Demerol Nausea And Vomiting  . Oxycodone Hcl Other (See Comments)    Hallucinations   . Rosuvastatin Nausea And Vomiting and Other (See Comments)    HIGHER DOSE-Causes Leg cramps  . Protonix [Pantoprazole Sodium] Nausea Only  . Augmentin [Amoxicillin-Pot Clavulanate] Nausea And Vomiting  . Ivp Dye [Iodinated Diagnostic Agents]     Hives, itching  . Shellfish Allergy     Hives, itching  . Trazodone And Nefazodone Other (See Comments)    Sedation   . Xanax [Alprazolam]     Sedation, even at low dose    Past Medical History  Diagnosis Date  . Hyperlipidemia   . Coronary artery disease   . Hypertension   . PVD (peripheral vascular disease)     endarterectomy by [name]  . Pre-syncope   . Thrombophlebitis     following childbirth  . Cerebrovascular disease     extracranial; occlusive  . Ulcer   . Dizziness   . Chicken pox   . Depression   . Fainting spell   . Allergy     hay fever  . Anemia   . DVT (deep venous thrombosis)   . Heart murmur   . PONV (postoperative nausea and vomiting)  severe nausea and vomiting  . Shortness of breath dyspnea     wth ambulation at times  . Dizziness   . Anxiety   . GERD (gastroesophageal reflux disease)   . Swelling of both ankles     and abdomen; takes Lasix when needed  . Seasonal allergies   . Headache   . Carotid artery occlusion     Past Surgical History  Procedure Laterality Date  . Endarterectomy  2006    [name]  . Septoplasty      with bilateral inferior turbinate reductions  . Appendectomy    . Breast surgery      saline implants  . Cataract extraction, bilateral  2013  . Upper gi endoscopy    . Cholecystectomy, laparoscopic  04/02/14    Dr. Rochel Brome  . Coronary artery bypass graft  2006    x3 Dr. Prescott Gum  . Total abdominal hysterectomy    . Cholecystectomy  Oct. 15, 2015    Gall Bladder  .  Carotid endarterectomy  1992  . Carotid endarterectomy Right February 13, 2005    Re-do Right CE  . Eye surgery Bilateral Feb. 2013    Cataract Left eye  . Cardiac catheterization    . Upper gastrointestinal endoscopy      had polyps removed from esophagus  . Endarterectomy Left 07/20/2014    Procedure: ENDARTERECTOMY CAROTID-LEFT;  Surgeon: Rosetta Posner, MD;  Location: [facility] OR;  Service: Vascular;  Laterality: Left;    Family History  Problem Relation Age of Onset  . Aneurysm Mother     brain  . Heart disease Mother     Aneyursm   . Hyperlipidemia Mother   . Hypertension Mother   . Heart disease Father   . Cirrhosis Father   . Heart attack Father   . Hyperlipidemia Father   . Hypertension Father   . Heart disease Brother     Before age 14  . Aneurysm Brother   . Deep vein thrombosis Brother   . Birth defects Brother   . Hyperlipidemia Brother   . Hypertension Brother   . Hypertension Other   . Cancer Sister     lung  . Heart disease Sister     Aneurysm  . Hyperlipidemia Sister   . Hypertension Sister   . Varicose Veins Sister   . Breast cancer Neg Hx   . Colon cancer Neg Hx   . Peripheral vascular disease Daughter     History   Social History  . Marital Status: Married    Spouse Name: N/A  . Number of Children: N/A  . Years of Education: N/A   Occupational History  . Not on file.   Social History Main Topics  . Smoking status: Former Smoker -- 0.30 packs/day for 15 years    Types: Cigarettes    Quit date: 08/31/1996  . Smokeless tobacco: Never Used  . Alcohol Use: No  . Drug Use: No  . Sexual Activity: Not on file   Other Topics Concern  . Not on file   Social History Narrative   Lives with husband, has 1 daughter. No pets.   Work - retired Emergency planning/management officer   Diet - healthy   Exercise - treadmill         Review of Systems No rash Vomiting and diarrhea better since stopping augmentin Appetite is not good    Objective:   Physical Exam    Constitutional: No distress.  Looks mildly uncomfortable  HENT:  Mouth/Throat: Oropharynx is clear and moist. No oropharyngeal exudate.  No sinus tenderness Left TM normal, cerumen on right Moderate nasal inflammation  Neck:  Tender from past surgery  Pulmonary/Chest: Effort normal and breath sounds normal. No respiratory distress. She has no wheezes. She has no rales.  Lymphadenopathy:    She has no cervical adenopathy.          Assessment & Plan:

## 2014-12-22 NOTE — Op Note (Signed)
PATIENT NAME:  Catherine Green, Catherine Green MR#:  [id number] DATE OF BIRTH:  07-Jul-1941  DATE OF PROCEDURE:  03/15/2014  PREOPERATIVE DIAGNOSIS: Chronic cholecystitis.   POSTOPERATIVE DIAGNOSIS:  Chronic cholecystitis.  PROCEDURE: Laparoscopic cholecystectomy, cholangiogram.   SURGEON: Rochel Brome, M.D.   ANESTHESIA: General.   INDICATIONS: This 74 year old female has a history of right upper quadrant pains, fatty food intolerance, ultrasound findings of gallbladder polyps. A diagnosis of chronic cholecystitis was made and laparoscopic cholecystectomy was recommended for definitive treatment.   DESCRIPTION OF PROCEDURE: The patient was placed on the operating table in the supine position under general anesthesia. The abdomen was prepared with ChloraPrep and draped in a sterile manner.   A short incision was made in the inferior aspect of the umbilicus and carried down to the deep fascia which was grasped with laryngeal hook and elevated. A Veress needle was inserted, aspirated and irrigated with a saline solution. Next, the peritoneal cavity was inflated with carbon dioxide. The Veress needle was removed. The 10 mm cannula was inserted. The 10 mm, 0 degrees laparoscope was inserted to view the peritoneal cavity. There was an appearance of fatty liver. There was an adhesion adjacent to the cecum which appeared to be a band type adhesion, which appeared that it could potentially lead to a small bowel obstruction. Another incision was made in the epigastrium, slightly to the right of the midline to introduce an 11 mm cannula.   Next, the scissors and cautery were used to divide the band adjacent to the cecum. There was no bleeding. Next, another incision was made in the lateral aspect of the right upper quadrant to insert a 5 mm cannula and another slightly more inferior and lateral for a total of 4 ports sites.   The gallbladder was retracted towards the right shoulder. There were a number of adhesions, which  were taken down with hook and cautery and blunt dissection and sharp dissection. The gallbladder neck was retracted inferiorly and laterally. The dissection was carried out to expose the cystic duct and also isolated the cystic duct and cystic artery free from surrounding structures. The gallbladder neck was mobilized. A critical view of safety was demonstrated. An Endo Clip was placed across the cystic artery proximally and distally and divided for better exposure of the cystic duct. An Endo Clip was placed across the cystic duct adjacent to the neck of the gallbladder.   An incision was made in the cystic duct to introduce a Reddick catheter. Half-strength Conray-60 dye was injected as the cholangiogram was done with fluoroscopy demonstrating the biliary tree and flow of dye into the duodenum. No retained stones were seen. The Reddick catheter was removed. The cystic duct was doubly ligated with Endo clips and divided. The gallbladder was dissected free from the liver with hook and cautery and blunt dissection and following this hemostasis was intact.   The gallbladder was delivered up through the infraumbilical incision, opened and suctioned, removed and did have some palpable thickening at the end of the gallbladder fundus. This was submitted in formalin for routine pathology. The right upper quadrant was further inspected. Hemostasis appeared to be intact. The cannulas were removed. There was minimal bleeding from the port sites. A number of subcutaneous bleeding points were cauterized. The wounds were closed with interrupted 5-0 chromic subcuticular suture, benzoin, and Steri-Strips. Dressings were applied with paper tape. The patient tolerated surgery satisfactorily and was then prepared for transfer to the recovery room.    ____________________________ Lenna Sciara. Rochel Brome,  MD jws:ds D: 03/15/2014 15:06:30 ET T: 03/15/2014 15:26:15 ET JOB#: [id number]  cc: Loreli Dollar, MD, <Dictator> Loreli Dollar  MD ELECTRONICALLY SIGNED 03/18/2014 20:57

## 2015-01-01 ENCOUNTER — Ambulatory Visit: Payer: Commercial Managed Care - HMO | Admitting: Family

## 2015-01-01 ENCOUNTER — Other Ambulatory Visit (HOSPITAL_COMMUNITY): Payer: Commercial Managed Care - HMO

## 2015-01-16 ENCOUNTER — Encounter: Payer: Self-pay | Admitting: Family Medicine

## 2015-01-16 ENCOUNTER — Ambulatory Visit (INDEPENDENT_AMBULATORY_CARE_PROVIDER_SITE_OTHER): Payer: Commercial Managed Care - HMO | Admitting: Family Medicine

## 2015-01-16 ENCOUNTER — Ambulatory Visit (INDEPENDENT_AMBULATORY_CARE_PROVIDER_SITE_OTHER)
Admission: RE | Admit: 2015-01-16 | Discharge: 2015-01-16 | Disposition: A | Discharge status: Home / Self Care | Payer: Commercial Managed Care - HMO | Source: Ambulatory Visit | Attending: Family Medicine | Admitting: Family Medicine

## 2015-01-16 VITALS — BP 150/86 | HR 68 | Temp 97.9°F | Wt 159.0 lb

## 2015-01-16 DIAGNOSIS — R0602 Shortness of breath: Secondary | ICD-10-CM

## 2015-01-16 DIAGNOSIS — R059 Cough, unspecified: Secondary | ICD-10-CM

## 2015-01-16 DIAGNOSIS — R05 Cough: Secondary | ICD-10-CM

## 2015-01-16 LAB — COMPREHENSIVE METABOLIC PANEL
ALT: 19 U/L (ref 0–35)
AST: 25 U/L (ref 0–37)
Albumin: 4.7 g/dL (ref 3.5–5.2)
Alkaline Phosphatase: 81 U/L (ref 39–117)
BUN: 19 mg/dL (ref 6–23)
CO2: 30 mEq/L (ref 19–32)
Calcium: 10.3 mg/dL (ref 8.4–10.5)
Chloride: 101 mEq/L (ref 96–112)
Creatinine, Ser: 0.82 mg/dL (ref 0.40–1.20)
GFR: 72.26 mL/min (ref >60.00)
Glucose, Bld: 100 mg/dL — ABNORMAL HIGH (ref 70–99)
Potassium: 4.4 mEq/L (ref 3.5–5.1)
Sodium: 137 mEq/L (ref 135–145)
Total Bilirubin: 0.5 mg/dL (ref 0.2–1.2)
Total Protein: 7.6 g/dL (ref 6.0–8.3)

## 2015-01-16 LAB — CBC WITH DIFFERENTIAL/PLATELET
Basophils Absolute: 0 10*3/uL (ref 0.0–0.1)
Basophils Relative: 0.3 % (ref 0.0–3.0)
Eosinophils Absolute: 0.1 10*3/uL (ref 0.0–0.7)
Eosinophils Relative: 1.3 % (ref 0.0–5.0)
HCT: 37.9 % (ref 36.0–46.0)
Hemoglobin: 12.5 g/dL (ref 12.0–15.0)
Lymphocytes Relative: 26 % (ref 12.0–46.0)
Lymphs Abs: 2 10*3/uL (ref 0.7–4.0)
MCHC: 33 g/dL (ref 30.0–36.0)
MCV: 81.1 fl (ref 78.0–100.0)
Monocytes Absolute: 0.5 10*3/uL (ref 0.1–1.0)
Monocytes Relative: 6.8 % (ref 3.0–12.0)
Neutro Abs: 5 10*3/uL (ref 1.4–7.7)
Neutrophils Relative %: 65.6 % (ref 43.0–77.0)
Platelets: 351 10*3/uL (ref 150.0–400.0)
RBC: 4.67 Mil/uL (ref 3.87–5.11)
RDW: 15.2 % (ref 11.5–15.5)
WBC: 7.7 10*3/uL (ref 4.0–10.5)

## 2015-01-16 LAB — BRAIN NATRIURETIC PEPTIDE: Pro B Natriuretic peptide (BNP): 143 pg/mL — ABNORMAL HIGH (ref 0.0–100.0)

## 2015-01-16 MED ORDER — DOXYCYCLINE HYCLATE 100 MG PO TABS: 100.0000 mg | ORAL_TABLET | Freq: Two times a day (BID) | ORAL | Status: DC

## 2015-01-16 NOTE — Patient Instructions (Addendum)
Take an extra dose of lasix when you get home.   Start taking doxycyline today.   Take care.  Update me tomorrow.  We'll contact you with your lab report.

## 2015-01-16 NOTE — Progress Notes (Signed)
Pre visit review using our clinic review tool, if applicable. No additional management support is needed unless otherwise documented below in the visit note.  She has been repeatedly sick for the last few weeks.  She was seen by Dr. Silvio Pate in 11/2014.  She last felt well before her cruise in 10/2014.  She has been sick daily since getting back from the cruise.  Most recently- in the last week- with more stomach bloating, more SOB at night if laying flat.  She can't lay flat.  No fevers.  No sputum.  Dry cough.  Voice is altered.  No ST.  Some maxillary pain recently but not today.  No ear pain.  She has had some chills.  She had more ankle swelling in the last few weeks, still on lasix prn, with HCTZ taken daily.    She has had trouble laying flat for the lat 3-4 days.  No CP.  When she takes lasix, her breathing improves.  She last took lasix on Monday and slept better Monday night.  Still sleeping on 2 pillows the last few nights.  She has been eating more salt recently.   Meds, vitals, and allergies reviewed.   ROS: See HPI.  Otherwise, noncontributory.  GEN: nad, alert and oriented HEENT: mucous membranes moist, tm w/o erythema, nasal exam w/o erythema, clear discharge noted,  OP with cobblestoning NECK: supple w/o LA CV: rrr.   PULM: ctab except for scant rhonchi B, no inc wob, no wheeze, no focal dec in BS EXT: no edema SKIN: no acute rash

## 2015-01-17 ENCOUNTER — Telehealth: Payer: Self-pay

## 2015-01-17 DIAGNOSIS — R05 Cough: Secondary | ICD-10-CM | POA: Insufficient documentation

## 2015-01-17 DIAGNOSIS — R059 Cough, unspecified: Secondary | ICD-10-CM | POA: Insufficient documentation

## 2015-01-17 NOTE — Telephone Encounter (Signed)
Patient advised. Appointment scheduled.  

## 2015-01-17 NOTE — Telephone Encounter (Signed)
Pt is feeling better, SOB is not as bad,only coughed x 1 today and left ankle is not swollen; slight swelling in rt ankle. Pt notified about lab results as instructed and pt voiced understanding.pt has appt on 01/21/15 at 1pm at Dr Donivan Scull office for echo.

## 2015-01-17 NOTE — Telephone Encounter (Signed)
See result note.  Same message.   Great. I would continue with lasix once a day for now. If she is lightheaded, then skip a day. I want to see her after the echo is resulted, a few days after 01/21/15. I would still finish the abx. Thanks.

## 2015-01-17 NOTE — Assessment & Plan Note (Signed)
Likely multifactorial.  BNP slightly elevated.  Will continue lasix for now.  Check echo, given pulmonary effusions.   Given the scattered rhonchi (faint) I would still use the abx even in the absence of PNA (I questioned early CAP on the RLL, but overread is noted and appreciated). >25 minutes spent in face to face time with patient, >50% spent in counselling or coordination of care.  Still okay for outpatient f/u.

## 2015-01-18 ENCOUNTER — Telehealth (HOSPITAL_COMMUNITY): Payer: Self-pay | Admitting: Cardiovascular Disease

## 2015-01-21 ENCOUNTER — Other Ambulatory Visit: Payer: Self-pay

## 2015-01-21 ENCOUNTER — Other Ambulatory Visit (HOSPITAL_COMMUNITY): Payer: Commercial Managed Care - HMO

## 2015-01-21 ENCOUNTER — Ambulatory Visit (INDEPENDENT_AMBULATORY_CARE_PROVIDER_SITE_OTHER): Payer: Commercial Managed Care - HMO

## 2015-01-21 DIAGNOSIS — R0602 Shortness of breath: Secondary | ICD-10-CM | POA: Diagnosis not present

## 2015-01-24 ENCOUNTER — Ambulatory Visit (INDEPENDENT_AMBULATORY_CARE_PROVIDER_SITE_OTHER): Payer: Commercial Managed Care - HMO | Admitting: Family Medicine

## 2015-01-24 ENCOUNTER — Encounter: Payer: Self-pay | Admitting: Family Medicine

## 2015-01-24 ENCOUNTER — Ambulatory Visit (INDEPENDENT_AMBULATORY_CARE_PROVIDER_SITE_OTHER)
Admission: RE | Admit: 2015-01-24 | Discharge: 2015-01-24 | Disposition: A | Discharge status: Home / Self Care | Payer: Commercial Managed Care - HMO | Source: Ambulatory Visit | Attending: Family Medicine | Admitting: Family Medicine

## 2015-01-24 VITALS — BP 140/70 | HR 67 | Temp 98.3°F | Wt 151.0 lb

## 2015-01-24 DIAGNOSIS — J90 Pleural effusion, not elsewhere classified: Secondary | ICD-10-CM | POA: Diagnosis not present

## 2015-01-24 DIAGNOSIS — R0602 Shortness of breath: Secondary | ICD-10-CM | POA: Diagnosis not present

## 2015-01-24 DIAGNOSIS — R05 Cough: Secondary | ICD-10-CM | POA: Diagnosis not present

## 2015-01-24 DIAGNOSIS — R059 Cough, unspecified: Secondary | ICD-10-CM

## 2015-01-24 NOTE — Assessment & Plan Note (Signed)
Ctab.  Pleural effusion resolved on cxr, reviewed images.  Echo reviewed with patient at [place],  normal.  Await BNP and BMET.  Hold hctz and lasix for now, will address when labs resulted.   She may just have had effusion from the illness, now resolved, and only needed transient lasix use.  May only need to restart HCTZ at 1/2 tab a day in the future, can address with labs that are pending.  >25 minutes spent in face to face time with patient, >50% spent in counselling or coordination of care.

## 2015-01-24 NOTE — Progress Notes (Signed)
Pre visit review using our clinic review tool, if applicable. No additional management support is needed unless otherwise documented below in the visit note.  F/u for cough/pleural effusions.  Weight is down.  No orthopnea now, can lay flat.  Prev on lasix.  She isn't lightheaded on standing, but can get some sx after walking a distance.  No HCTZ today; took 1 lasix today.   No CP.  Her breathing is better now.  No BLE edema now; resolved.  About to finish abx, so removed from med list.  Overall she feels better.     Meds, vitals, and allergies reviewed.   ROS: See HPI.  Otherwise, noncontributory.  GEN: nad, alert and oriented HEENT: mucous membranes moist NECK: supple w/o LA CV: rrr.  no murmur PULM: ctab, no inc wob ABD: soft, +bs EXT: no edema SKIN: no acute rash

## 2015-01-24 NOTE — Patient Instructions (Signed)
Skip the HCTZ and lasix for now.  Go to the lab on the way out.  We'll contact you with your lab report. We'll be in touch about what to do with your meds.

## 2015-01-25 ENCOUNTER — Other Ambulatory Visit: Payer: Self-pay | Admitting: Family Medicine

## 2015-01-25 LAB — BASIC METABOLIC PANEL
BUN: 31 mg/dL — ABNORMAL HIGH (ref 6–23)
CO2: 37 mEq/L — ABNORMAL HIGH (ref 19–32)
Calcium: 9.9 mg/dL (ref 8.4–10.5)
Chloride: 87 mEq/L — ABNORMAL LOW (ref 96–112)
Creatinine, Ser: 1.07 mg/dL (ref 0.40–1.20)
GFR: 53.15 mL/min — ABNORMAL LOW (ref >60.00)
Glucose, Bld: 115 mg/dL — ABNORMAL HIGH (ref 70–99)
Potassium: 3.1 mEq/L — ABNORMAL LOW (ref 3.5–5.1)
Sodium: 135 mEq/L (ref 135–145)

## 2015-01-25 LAB — BRAIN NATRIURETIC PEPTIDE: Brain Natriuretic Peptide: 21.2 pg/mL (ref 0.0–100.0)

## 2015-01-29 ENCOUNTER — Ambulatory Visit: Payer: Commercial Managed Care - HMO | Admitting: Family Medicine

## 2015-02-04 ENCOUNTER — Telehealth: Payer: Self-pay

## 2015-02-04 DIAGNOSIS — I10 Essential (primary) hypertension: Secondary | ICD-10-CM

## 2015-02-04 MED ORDER — FUROSEMIDE 20 MG PO TABS: ORAL_TABLET | ORAL | Status: DC

## 2015-02-04 MED ORDER — POTASSIUM CHLORIDE CRYS ER 20 MEQ PO TBCR: 20.0000 meq | EXTENDED_RELEASE_TABLET | Freq: Every day | ORAL | Status: DC

## 2015-02-04 NOTE — Telephone Encounter (Signed)
Patient advised.  Lab appt scheduled.  

## 2015-02-04 NOTE — Telephone Encounter (Signed)
Pt's feet and ankles started to swell 01/30/15, also feeling swelling under breast in upper abd;slight SOB; no BP or wheezing. Pt cannot lay down; has been sleeping in recliner. Dr Damita Dunnings stopped HCTZ and Lasix on 01/25/15 per lab result note. In last week pt has gained from 151 lbs to 158 lbs. Over weekend pt restarted taking HCTZ 50 mg one tab daily without any relief from the swelling. Pt has not taken BP today but on 02/03/15 BP was 150/60-70.[facility]. Pt has HCTZ and lasix at home just wants instruction of how to take med.Please advise.

## 2015-02-04 NOTE — Telephone Encounter (Signed)
Let's stop hydrochlorothiazide and start lasix 20mg  daily along with potassium 50mEq daily (both sent to pharmacy). Why can't she lay down - does it worsen dyspnea? Call us with update 3 days after taking lasix 20mg  daily, come in for f/u lab work on [employer]

## 2015-02-06 ENCOUNTER — Other Ambulatory Visit (INDEPENDENT_AMBULATORY_CARE_PROVIDER_SITE_OTHER): Payer: Commercial Managed Care - HMO

## 2015-02-06 DIAGNOSIS — I10 Essential (primary) hypertension: Secondary | ICD-10-CM | POA: Diagnosis not present

## 2015-02-06 LAB — RENAL FUNCTION PANEL
Albumin: 4 g/dL (ref 3.5–5.2)
BUN: 22 mg/dL (ref 6–23)
CO2: 29 mEq/L (ref 19–32)
Calcium: 9.2 mg/dL (ref 8.4–10.5)
Chloride: 100 mEq/L (ref 96–112)
Creatinine, Ser: 0.76 mg/dL (ref 0.40–1.20)
GFR: 78.87 mL/min (ref >60.00)
Glucose, Bld: 88 mg/dL (ref 70–99)
Phosphorus: 3.3 mg/dL (ref 2.3–4.6)
Potassium: 3.9 mEq/L (ref 3.5–5.1)
Sodium: 136 mEq/L (ref 135–145)

## 2015-02-06 NOTE — Telephone Encounter (Addendum)
Still awaiting lab results from today - we will call her when they arrive. I recommend office evaluation either this week with myself or next week with PCP. In interim, continue lasix 20mg  daily.

## 2015-02-06 NOTE — Telephone Encounter (Signed)
Note.  Prev echo okay, had responded to lasix prev.  Thanks.

## 2015-02-06 NOTE — Telephone Encounter (Signed)
Patient notified. Appt scheduled with you on Friday so she wouldn't have to go through the weekend.

## 2015-02-06 NOTE — Telephone Encounter (Signed)
Pt came to office for labs and reported pt started Lasix 02/04/15 and has taken for 3 days;pt is voiding a lot; todays weight is 156.SOB is slightly better. Pt said she is not sleeping; pt goes to bed at 10 PM and then wakes up every 2 -3 hours for 30 - 60 mins each time; then pt is sleepy all day. The pt sleeps in recliner because she sleeps better if head at 90 degree angle; if tries to sleep on 2 pillows in bed still has increased dyspnea. Pt request cb.

## 2015-02-08 ENCOUNTER — Ambulatory Visit (INDEPENDENT_AMBULATORY_CARE_PROVIDER_SITE_OTHER): Payer: Commercial Managed Care - HMO | Admitting: Family Medicine

## 2015-02-08 ENCOUNTER — Encounter: Payer: Self-pay | Admitting: Vascular Surgery

## 2015-02-08 ENCOUNTER — Encounter: Payer: Self-pay | Admitting: Family Medicine

## 2015-02-08 VITALS — BP 144/82 | HR 58 | Temp 97.9°F | Wt 158.0 lb

## 2015-02-08 DIAGNOSIS — R0600 Dyspnea, unspecified: Secondary | ICD-10-CM | POA: Diagnosis not present

## 2015-02-08 DIAGNOSIS — M79605 Pain in left leg: Secondary | ICD-10-CM | POA: Diagnosis not present

## 2015-02-08 DIAGNOSIS — M79604 Pain in right leg: Secondary | ICD-10-CM

## 2015-02-08 DIAGNOSIS — R0602 Shortness of breath: Secondary | ICD-10-CM | POA: Insufficient documentation

## 2015-02-08 NOTE — Assessment & Plan Note (Signed)
Not consistent with DVT. CVI discomfort vs more likely statin induced myalgias given temporal relation of pain.   Check CPK today if we cannot add to labs from Wednesday rec decrease crestor to 5mg  daily and add CoQ10 60-100mg  daily OTC. Suggested she may discuss Praluent novel injectable antihyperlipidemic with Dr Rockey Situ. Pt agrees with plan.

## 2015-02-08 NOTE — Progress Notes (Signed)
BP 144/82 mmHg  Pulse 58  Temp(Src) 97.9 F (36.6 C) (Oral)  Wt 158 lb (71.668 kg)  SpO2 96%   CC: dyspnea with leg soreness Subjective:    Patient ID: Catherine Green, female    DOB: 1941-04-14, 74 y.o.   MRN: [id number]  HPI: Catherine Green is a 74 y.o. female presenting on 02/08/2015 for Follow-up   See recent OV and phone notes for details. Pt is 74 yo patient of Dr Josefine Class with CAD s/p CABG, PVD s/p bilateral CEAs who has had increased dyspnea with chest congestion over last 1.5 months. Evaluated by PCP twice with echocardiogram showing mild diastolic dysfunction and normal valve function. Xray showed L pleural effusion, rpt showed resolution.  BNP normal.   This started after cruise to [place] (3/24-4/8). Cruise left from [place] - they flew there. She did have URI during cruise.   Notes persistent leg swelling and ache from knees down. No redness of warmth of legs. Also with persistent dyspnea   Has been compliant with lasix 20mg  daily but notes insignificant UOP. Sleeps on 2 pillows at night time or in recliner. Endorse orthopnea. Endorses occasional night time palpitations. Occasional nausea.   Denies chest pain or tightness, vomiting, fevers, or coughing/congestion.   Denies h/o asthma, COPD. No current allergy sxs.  Minimal snoring. No daytime sleepiness. No PNDyspnea. No witnessed apneic episodes. She is on crestor for prolonged time - recently increased crestor from 5->10mg  in the last month.   Relevant past medical, surgical, family and social history reviewed and updated as indicated. Interim medical history since our last visit reviewed. Allergies and medications reviewed and updated. Current Outpatient Prescriptions on File Prior to Visit  Medication Sig  . aspirin 81 MG tablet Take 162 mg by mouth daily.   . carvedilol (COREG) 6.25 MG tablet TAKE 1 TABLET BY MOUTH TWICE A DAY WITH FOOD  . furosemide (LASIX) 20 MG tablet TAKE 1 TABLET (20 MG TOTAL) BY MOUTH DAILY  .  meclizine (ANTIVERT) 25 MG tablet Take 0.5-1 tablets (12.5-25 mg total) by mouth 3 (three) times daily as needed for dizziness.  . potassium chloride SA (K-DUR,KLOR-CON) 20 MEQ tablet Take 1 tablet (20 mEq total) by mouth daily. With lasix  . ranitidine (ZANTAC) 150 MG tablet Take 1 tablet (150 mg total) by mouth 2 (two) times daily as needed for heartburn (or abdominal pain).   No current facility-administered medications on file prior to visit.    Review of Systems Per HPI unless specifically indicated above     Objective:    BP 144/82 mmHg  Pulse 58  Temp(Src) 97.9 F (36.6 C) (Oral)  Wt 158 lb (71.668 kg)  SpO2 96%  Wt Readings from Last 3 Encounters:  02/08/15 158 lb (71.668 kg)  01/24/15 151 lb (68.493 kg)  01/16/15 159 lb (72.122 kg)    Physical Exam  Constitutional: She appears well-developed and well-nourished. No distress.  HENT:  Mouth/Throat: Oropharynx is clear and moist. No oropharyngeal exudate.  Cardiovascular: Normal rate and intact distal pulses.   Murmur (3/6 SEM best at LUSB - in setting of normal echo) heard. Pulmonary/Chest: Effort normal and breath sounds normal. No respiratory distress. She has no wheezes. She has no rales.  Musculoskeletal: She exhibits edema (mild nonpitting edema bilateral LE).  L calf circ 38cm R calf circ 38cm 1+ DP bilaterally  Skin: Skin is warm and dry. No rash noted.  Psychiatric: She has a normal mood and affect.  Nursing note  and vitals reviewed.  Results for orders placed or performed in visit on 02/06/15  Renal function panel  Result Value Ref Range   Sodium 136 135 - 145 mEq/L   Potassium 3.9 3.5 - 5.1 mEq/L   Chloride 100 96 - 112 mEq/L   CO2 29 19 - 32 mEq/L   Calcium 9.2 8.4 - 10.5 mg/dL   Albumin 4.0 3.5 - 5.2 g/dL   BUN 22 6 - 23 mg/dL   Creatinine, Ser 0.76 0.40 - 1.20 mg/dL   Glucose, Bld 88 70 - 99 mg/dL   Phosphorus 3.3 2.3 - 4.6 mg/dL   GFR 78.87 >60.00 mL/min   Lab Results  Component Value Date    CHOL 212* 11/06/2014   HDL 63 11/06/2014   LDLCALC 115* 11/06/2014   LDLDIRECT 139.1 04/11/2012   TRIG 170* 11/06/2014   CHOLHDL 3.4 11/06/2014      Assessment & Plan:   Problem List Items Addressed This Visit    Dyspnea    Good O2 sats today, no respiratory distress on exam. Anticipate residual dyspnea from recent URI.  Continues improving daily. Continue lasix 20mg  daily.      Leg pain, bilateral - Primary    Not consistent with DVT. CVI discomfort vs more likely statin induced myalgias given temporal relation of pain.   Check CPK today if we cannot add to labs from Wednesday rec decrease crestor to 5mg  daily and add CoQ10 60-100mg  daily OTC. Suggested she may discuss Praluent novel injectable antihyperlipidemic with Dr Rockey Situ. Pt agrees with plan.      Relevant Orders   CK       Follow up plan: Return if symptoms worsen or fail to improve.

## 2015-02-08 NOTE — Patient Instructions (Addendum)
Leg pain may be from crestor given recent increase in dose. Start co Q 10 60 to 100mg  (whichever you find) over the counter daily to see if we get relief from leg pain and decrease crestor back to 5mg .  Let's keep an eye on shortness of breath but should continue to improve. Continue lasix 20mg  daily. Let's check CK today.  May need to consider with Dr. Rockey Situ new injectable medicine for cholesterol praluent.  Give [place] an update next week with how you're feeling.

## 2015-02-08 NOTE — Assessment & Plan Note (Signed)
Good O2 sats today, no respiratory distress on exam. Anticipate residual dyspnea from recent URI.  Continues improving daily. Continue lasix 20mg  daily.

## 2015-02-08 NOTE — Progress Notes (Signed)
Pre visit review using our clinic review tool, if applicable. No additional management support is needed unless otherwise documented below in the visit note. 

## 2015-02-09 LAB — CK: Total CK: 357 U/L — ABNORMAL HIGH (ref 7–177)

## 2015-02-12 ENCOUNTER — Ambulatory Visit (INDEPENDENT_AMBULATORY_CARE_PROVIDER_SITE_OTHER): Payer: Commercial Managed Care - HMO | Admitting: Vascular Surgery

## 2015-02-12 ENCOUNTER — Ambulatory Visit (HOSPITAL_COMMUNITY)
Admission: RE | Admit: 2015-02-12 | Discharge: 2015-02-12 | Disposition: A | Discharge status: Home / Self Care | Payer: Commercial Managed Care - HMO | Source: Ambulatory Visit | Attending: Vascular Surgery | Admitting: Vascular Surgery

## 2015-02-12 ENCOUNTER — Encounter: Payer: Self-pay | Admitting: Vascular Surgery

## 2015-02-12 VITALS — BP 185/56 | HR 56 | Ht 62.0 in | Wt 157.3 lb

## 2015-02-12 DIAGNOSIS — I6523 Occlusion and stenosis of bilateral carotid arteries: Secondary | ICD-10-CM | POA: Diagnosis not present

## 2015-02-12 DIAGNOSIS — Z48812 Encounter for surgical aftercare following surgery on the circulatory system: Secondary | ICD-10-CM

## 2015-02-12 NOTE — Progress Notes (Signed)
HISTORY AND PHYSICAL     CC:  F/u carotid ultrasound Referring Provider:  Tonia Ghent, MD  HPI: This is a 74 y.o. female who is s/p right CEA in 1992 with revision in 2006 due to pseudoaneurysm and left CEA on 07/20/14.  She states that she has been doing well and denies any amaurosis fugax, hemiparesis or difficulty with speech.  She does states that she has had trouble with her legs cramping after walking short distances.  This improves with rest.  She states they are weaning her off the crestor as it could be related to this.     She is on a beta blocker for HTN.  She is on a daily aspirin.  Past Medical History  Diagnosis Date  . Hyperlipidemia   . Coronary artery disease   . Hypertension   . PVD (peripheral vascular disease)     endarterectomy by [name]  . Pre-syncope   . Thrombophlebitis     following childbirth  . Cerebrovascular disease     extracranial; occlusive  . Ulcer   . Dizziness   . Chicken pox   . Depression   . Fainting spell   . Allergy     hay fever  . Anemia   . DVT (deep venous thrombosis)   . Heart murmur   . PONV (postoperative nausea and vomiting)     severe nausea and vomiting  . Shortness of breath dyspnea     wth ambulation at times  . Dizziness   . Anxiety   . GERD (gastroesophageal reflux disease)   . Swelling of both ankles     and abdomen; takes Lasix when needed  . Seasonal allergies   . Headache   . Carotid artery occlusion     Past Surgical History  Procedure Laterality Date  . Endarterectomy  2006    [name]  . Septoplasty      with bilateral inferior turbinate reductions  . Appendectomy    . Breast surgery      saline implants  . Cataract extraction, bilateral  2013  . Upper gi endoscopy    . Cholecystectomy, laparoscopic  04/02/14    Dr. Rochel Brome  . Coronary artery bypass graft  2006    x3 Dr. Prescott Gum  . Total abdominal hysterectomy    . Cholecystectomy  Oct. 15, 2015    Gall Bladder  . Carotid  endarterectomy  1992  . Carotid endarterectomy Right February 13, 2005    Re-do Right CE  . Eye surgery Bilateral Feb. 2013    Cataract Left eye  . Cardiac catheterization    . Upper gastrointestinal endoscopy      had polyps removed from esophagus  . Endarterectomy Left 07/20/2014    Procedure: ENDARTERECTOMY CAROTID-LEFT;  Surgeon: Rosetta Posner, MD;  Location: [facility];  Service: Vascular;  Laterality: Left;    Allergies  Allergen Reactions  . Atorvastatin Nausea And Vomiting  . Codeine Nausea And Vomiting  . Demerol Nausea And Vomiting  . Oxycodone Hcl Other (See Comments)    Hallucinations   . Rosuvastatin Nausea And Vomiting and Other (See Comments)    HIGHER DOSE-Causes Leg cramps  . Protonix [Pantoprazole Sodium] Nausea Only  . Augmentin [Amoxicillin-Pot Clavulanate] Nausea And Vomiting  . Ivp Dye [Iodinated Diagnostic Agents]     Hives, itching  . Shellfish Allergy     Hives, itching  . Trazodone And Nefazodone Other (See Comments)    Sedation   .  Xanax [Alprazolam]     Sedation, even at low dose    Current Outpatient Prescriptions  Medication Sig Dispense Refill  . aspirin 81 MG tablet Take 162 mg by mouth daily.     . carvedilol (COREG) 6.25 MG tablet TAKE 1 TABLET BY MOUTH TWICE A DAY WITH FOOD 180 tablet 3  . furosemide (LASIX) 20 MG tablet TAKE 1 TABLET (20 MG TOTAL) BY MOUTH DAILY 30 tablet 3  . meclizine (ANTIVERT) 25 MG tablet Take 0.5-1 tablets (12.5-25 mg total) by mouth 3 (three) times daily as needed for dizziness.    . potassium chloride SA (K-DUR,KLOR-CON) 20 MEQ tablet Take 1 tablet (20 mEq total) by mouth daily. With lasix 30 tablet 3  . ranitidine (ZANTAC) 150 MG tablet Take 1 tablet (150 mg total) by mouth 2 (two) times daily as needed for heartburn (or abdominal pain).    . rosuvastatin (CRESTOR) 10 MG tablet Take 0.5 tablets (5 mg total) by mouth daily.     No current facility-administered medications for this visit.    Family History  Problem  Relation Age of Onset  . Aneurysm Mother     brain  . Heart disease Mother     Aneyursm   . Hyperlipidemia Mother   . Hypertension Mother   . Varicose Veins Mother   . Bleeding Disorder Mother   . Heart disease Father   . Cirrhosis Father   . Heart attack Father   . Hyperlipidemia Father   . Hypertension Father   . Heart disease Brother     Before age 74  . Aneurysm Brother   . Deep vein thrombosis Brother   . Birth defects Brother   . Hyperlipidemia Brother   . Hypertension Brother   . Hypertension Other   . Cancer Sister     lung  . Heart disease Sister     Aneurysm  . Hyperlipidemia Sister   . Hypertension Sister   . Varicose Veins Sister   . Breast cancer Neg Hx   . Colon cancer Neg Hx   . Peripheral vascular disease Daughter   . Hyperlipidemia Daughter   . Hypertension Daughter   . Bleeding Disorder Sister     History   Social History  . Marital Status: Married    Spouse Name: N/A  . Number of Children: N/A  . Years of Education: N/A   Occupational History  . Not on file.   Social History Main Topics  . Smoking status: Former Smoker -- 0.30 packs/day for 15 years    Types: Cigarettes    Quit date: 08/31/1996  . Smokeless tobacco: Never Used  . Alcohol Use: No  . Drug Use: No  . Sexual Activity: Not on file   Other Topics Concern  . Not on file   Social History Narrative   Lives with husband, has 1 daughter. No pets.   Work - retired Emergency planning/management officer   Diet - healthy   Exercise - treadmill           ROS: [x]  Positive   [ ]  Negative   [ ]  All sytems reviewed and are negative  Cardiovascular: []  chest pain/pressure []  palpitations []  SOB lying flat []  DOE [x]  pain in legs while walking []  pain in feet when lying flat [x]  hx of DVT [x]  hx of phlebitis [x]  swelling in legs []  varicose veins  Pulmonary: []  productive cough []  asthma []  wheezing  Neurologic: [x]  weakness in []  arms [x]  legs []  numbness  in []  arms []   legs [] difficulty speaking or slurred speech []  temporary loss of vision in one eye []  dizziness  Hematologic: []  bleeding problems []  problems with blood clotting easily  GI []  vomiting blood []  blood in stool  GU: []  burning with urination []  blood in urine  Psychiatric: []  hx of major depression  Integumentary: []  rashes []  ulcers  Constitutional: []  fever []  chills   PHYSICAL EXAMINATION:  Filed Vitals:   02/12/15 1553  BP: 185/56  Pulse:    Body mass index is 28.76 kg/(m^2).  General:  WDWN in NAD Gait: Normal HENT: WNL, normocephalic Pulmonary: normal non-labored breathing , without Rales, rhonchi,  wheezing Cardiac: RRR, without  Murmurs, rubs or gallops; with right carotid bruit Skin: without rashes, without ulcers  Vascular Exam/Pulses: 1-2+ palpable DP pulses bilaterally  Extremities: without ischemic changes, without Gangrene , without cellulitis; without open wounds;  Musculoskeletal: no muscle wasting or atrophy  Neurologic: A&O X 3; Appropriate Affect ; SENSATION: normal; MOTOR FUNCTION:  moving all extremities equally. Speech is fluent/normal   Non-Invasive Vascular Imaging:   Carotid duplex 02/12/15: -patent bilateral carotid endarterectomy sites with doppler velocities suggestive of 1-49% -right proximal ICA stenosis 1-49% -left ICA stenosis 50-69% -greater than 50% left ECA stenosis  Pt meds includes: Statin:  Yes.   Beta Blocker:  Yes.   Aspirin:  Yes.   ACEI:  No. ARB:  No. Other Antiplatelet/Anticoagulant:  No.    ASSESSMENT/PLAN:: 74 y.o. female s/p right CEA in '92 with revision on 02/13/05 due to pseudoaneurysm and left CEA on 07/20/14.   -pt is doing well without any neurological events -she does have 50-69% stenosis of the left ICA 7 months after surgery, which could be due to hypertrophic scaring  -will repeat carotid duplex in 6 months -regarding her legs, she is having symptoms of claudication bilaterally.  She does  have 1-2+ palpable DP pulses bilaterally.  We will obtain ABI's in 6 months when she returns for her carotid duplex.  If her legs worsen, we will get the ABI's sooner.  PCP is weaning her Crestor as her leg pain may be due to that.   -she will contact us sooner if she has any problems   Leontine Locket, PA-C [facility] [phone]  Clinic MD:  Pt seen and examined in conjunction with [name]  I have examined the patient, reviewed and agree with above. Patient has no neurologic deficits. Bilateral neck incisions are well-healed. Does have some elevated velocities in the left carotid most likely related to intimal hyperplasia. Will recheck this in 6 months. Does have some lower from claudication type symptoms. She does have palpable dorsalis pedis pulses so this is unlikely related. She will see if this improves off statin. Will check a ankle arm index in 6 months time of her carotid follow-up. Will see this and check sooner if she has persistent symptoms  Curt Jews, MD 02/12/2015 4:51 PM

## 2015-02-13 NOTE — Addendum Note (Signed)
Addended by: Dorthula Rue L on: 02/13/2015 11:40 AM   Modules accepted: Orders

## 2015-03-06 ENCOUNTER — Telehealth: Payer: Self-pay | Admitting: *Deleted

## 2015-03-06 NOTE — Telephone Encounter (Signed)
Lm on pts vm regarding scheduling mammogram

## 2015-03-27 ENCOUNTER — Ambulatory Visit (INDEPENDENT_AMBULATORY_CARE_PROVIDER_SITE_OTHER): Payer: Commercial Managed Care - HMO | Admitting: Family Medicine

## 2015-03-27 ENCOUNTER — Encounter: Payer: Self-pay | Admitting: Family Medicine

## 2015-03-27 VITALS — BP 160/74 | HR 60 | Temp 97.9°F | Wt 159.2 lb

## 2015-03-27 DIAGNOSIS — R252 Cramp and spasm: Secondary | ICD-10-CM | POA: Diagnosis not present

## 2015-03-27 DIAGNOSIS — M79605 Pain in left leg: Secondary | ICD-10-CM | POA: Diagnosis not present

## 2015-03-27 DIAGNOSIS — M79604 Pain in right leg: Secondary | ICD-10-CM

## 2015-03-27 LAB — BASIC METABOLIC PANEL
BUN: 17 mg/dL (ref 6–23)
CO2: 35 mEq/L — ABNORMAL HIGH (ref 19–32)
Calcium: 9.8 mg/dL (ref 8.4–10.5)
Chloride: 97 mEq/L (ref 96–112)
Creatinine, Ser: 0.84 mg/dL (ref 0.40–1.20)
GFR: 70.25 mL/min (ref >60.00)
Glucose, Bld: 107 mg/dL — ABNORMAL HIGH (ref 70–99)
Potassium: 3.7 mEq/L (ref 3.5–5.1)
Sodium: 139 mEq/L (ref 135–145)

## 2015-03-27 NOTE — Patient Instructions (Addendum)
Stay off the statin for now and update me in about 1 week.   Go to the lab on the way out.  We'll contact you with your lab report. If you still have troubles, we'll likely need to get you over to [name] for a recheck of your legs vessels.   We may need to get you to ask Dr. Rockey Situ about praluent.  That may be an alternative to statins.

## 2015-03-27 NOTE — Progress Notes (Signed)
Pre visit review using our clinic review tool, if applicable. No additional management support is needed unless otherwise documented below in the visit note.  Leg pain.  Going on for extended period.  Bilateral from the knees distally, anterior and posterior portions of the legs.  It can come on quickly.  Off statin for about 10 days.  Not tingling or burning, but "it felt likely something clawing the inside."  She feels awful globally when it happens.  No CP, not SOB.  The episodes tend to happen when up walking around.  Doesn't happen at rest.  She had some BLE edema yesterday, resolved now (she had her feet up this AM).    She didn't have this trouble with leg pain until she was on statins, per her report.   She has fewer episodes since she stopped her statin.   She is on co-q 10, 100mg  a day.    Compression stockings didn't help prev.    Meds, vitals, and allergies reviewed.   ROS: See HPI.  Otherwise, noncontributory.  nad ncat Mmm rrr ctab abd soft Trace to 1+ BLE edema 1+ B DP pulses.   Calf not ttp B

## 2015-03-28 NOTE — Assessment & Plan Note (Signed)
This may all be statin induced.  She is some better recently off statin.  Will stay off the statin for now and update me in about 1 week.  If still with troubles, we'll likely need to get her over to [name] for a recheck of her BLE vessels.  We may need to get input from Dr. Rockey Situ about praluent. That may be an alternative to statins.  D/w pt.  She agrees.  Recheck BMET to recheck cr/K.  She agrees.

## 2015-04-01 ENCOUNTER — Telehealth: Payer: Self-pay

## 2015-04-01 NOTE — Telephone Encounter (Signed)
Yes, thanks.  I would cut the coreg in half and take 1/2 tab twice a day. Please update [place] in a few days.  Thanks.

## 2015-04-01 NOTE — Telephone Encounter (Signed)
If she hasn't historically had trouble with the coreg, then that is likely incidental.  She can skip her next dose of coreg if needed.  If she feels likely she has a GI illness causing the vomiting, then zofran may help.  Let me know if she wants it sent in.  I don't think that stopping statin would have caused these sx.  If she wants eval, then please add her on at 2:30.  Thanks.

## 2015-04-01 NOTE — Telephone Encounter (Signed)
Patient advised.

## 2015-04-01 NOTE — Telephone Encounter (Signed)
Patient has historically had problems with the Coreg and over the weekend, she actually became very sick and vomited.  Patient actually has started cutting her Coreg in half (last night and this morning) and she seems to be tolerating that well.  Her BP has been in the 92'F diastolic on a whole tablet.  The 2:30 slot was taken simultaneously as I tried to block it but patient says she would like to try this regimen of one half tablet for a few days and monitor her BP.  Is that okay?

## 2015-04-01 NOTE — Telephone Encounter (Signed)
Pt has been off statin; pt continues with leg cramps until 03/31/15. No leg cramps today but if walks has dull ache in lower legs. Pt gets very nauseated and vomits after taking Coreg. Pt not feeling well at all today. Pt request cb. [facility].

## 2015-04-08 MED ORDER — CARVEDILOL 3.125 MG PO TABS: ORAL_TABLET | ORAL | Status: DC

## 2015-04-08 NOTE — Telephone Encounter (Signed)
I would try cutting the coreg back more.  She'll need to get the new rx for 3.125mg  tabs and cut those in half.  Please update me in a few days.  Thanks.  rx sent.

## 2015-04-08 NOTE — Telephone Encounter (Signed)
-----   Message from Josetta Huddle, [place] sent at 04/08/2015  1:29 PM EDT ----- Pain is still there a little but not much.  Feet are still swelling.  BP:  125/47, P 66; 137/56 P 67.  Tolerating half dose but BP's seem to be running low diastolic. ----- Message -----    From: Josetta Huddle, CMA    Sent: 04/04/2015      To: Josetta Huddle, CMA  Check on patient from previous phone note.

## 2015-04-08 NOTE — Telephone Encounter (Signed)
Patient advised.

## 2015-04-09 ENCOUNTER — Telehealth: Payer: Self-pay | Admitting: Family Medicine

## 2015-04-09 ENCOUNTER — Encounter: Payer: Self-pay | Admitting: Family Medicine

## 2015-04-09 NOTE — Telephone Encounter (Signed)
Mammogram f/u call: pt refused but said she may think about it in the future, chart updated

## 2015-04-09 NOTE — Telephone Encounter (Signed)
Left 2nd voicemail requesting pt to call back regarding scheduling mammogram

## 2015-04-09 NOTE — Telephone Encounter (Signed)
Patient said she received a message this morning asking her to call Dr.[name]'s office.

## 2015-04-10 ENCOUNTER — Telehealth: Payer: Self-pay | Admitting: Family Medicine

## 2015-04-10 NOTE — Telephone Encounter (Signed)
Patient Name: Catherine Green DOB: 12-24-1940 Initial Comment Caller states her grandmother, has a big round red place- line coming from it. Her foot is swelled. Leg pain. Nurse Assessment Nurse: Ronnald Ramp, RN, Miranda Date/Time (Eastern Time): 04/10/2015 1:43:28 PM Confirm and document reason for call. If symptomatic, describe symptoms. ---Spoke with pt, states she has been having swelling in her left lower leg and foot. Today has redness in her left calf. Has the patient traveled out of the country within the last 30 days? ---Not Applicable Does the patient require triage? ---Yes Related visit to physician within the last 2 weeks? ---No Does the PT have any chronic conditions? (i.e. diabetes, asthma, etc.) ---Yes List chronic conditions. ---HTN, Edema, GERD Guidelines Guideline Title Affirmed Question Affirmed Notes Leg Swelling and Edema Entire foot is cool or blue in comparison to other side Final Disposition User Go to ED Now Ronnald Ramp, RN, Miranda Comments Pt repeatedly ask for an appt with Dr. Damita Dunnings. Explained that he does not have any appts for this afternoon. She was unsure of the symptoms and asked that I speak to her granddaughter. Spoke to the pt's granddaughter, she states the pt's left leg is very red, her foot is very swollen, there is a red lining running up her leg, and her foot looks slightly blue. She does not feel the pt will go to the ED. Spoke to the pt again and told her about the concern that her symptoms are concerning for a blood clot and she needs to be seen in the ER now. I told her that if this is a blood clot, she is putting her life at risk by not getting immediate treatment. Told pt that if she started having any trouble breathing or chest pain, she needs to call 911. Referrals GO TO FACILITY UNDECIDED Disagree/Comply: Disagree Disagree/Comply Reason: Disagree with instructions

## 2015-04-10 NOTE — Telephone Encounter (Signed)
Patient notified and verbalized understanding. She said she didn't want to go and sit in the ER all night. I advised that if she had a clot, then she needed to be in the ER and that she may not have to wait long. She said she would go.

## 2015-04-10 NOTE — Telephone Encounter (Signed)
To ER ASAP.

## 2015-04-10 NOTE — Telephone Encounter (Signed)
[facility] called due to a refusal to go to the ED,  Pt is having swollen legs, red streaks running through them and her feet are looking blue.  Pt requested a callback if possible.  [facility] has sent over a report, but wanted to let us know also.

## 2015-04-10 NOTE — Telephone Encounter (Signed)
Spoke with patient and she agreed to go to ER.

## 2015-04-11 ENCOUNTER — Emergency Department: Payer: Commercial Managed Care - HMO

## 2015-04-11 ENCOUNTER — Telehealth: Payer: Self-pay

## 2015-04-11 ENCOUNTER — Encounter: Payer: Self-pay | Admitting: Emergency Medicine

## 2015-04-11 ENCOUNTER — Emergency Department
Admission: EM | Admit: 2015-04-11 | Discharge: 2015-04-11 | Disposition: A | Discharge status: Home / Self Care | Payer: Commercial Managed Care - HMO | Attending: Emergency Medicine | Admitting: Emergency Medicine

## 2015-04-11 DIAGNOSIS — Z87891 Personal history of nicotine dependence: Secondary | ICD-10-CM | POA: Diagnosis not present

## 2015-04-11 DIAGNOSIS — I739 Peripheral vascular disease, unspecified: Secondary | ICD-10-CM | POA: Diagnosis not present

## 2015-04-11 DIAGNOSIS — Z7982 Long term (current) use of aspirin: Secondary | ICD-10-CM | POA: Insufficient documentation

## 2015-04-11 DIAGNOSIS — M79605 Pain in left leg: Secondary | ICD-10-CM | POA: Diagnosis present

## 2015-04-11 DIAGNOSIS — Z79899 Other long term (current) drug therapy: Secondary | ICD-10-CM | POA: Diagnosis not present

## 2015-04-11 DIAGNOSIS — I10 Essential (primary) hypertension: Secondary | ICD-10-CM | POA: Diagnosis not present

## 2015-04-11 DIAGNOSIS — I70219 Atherosclerosis of native arteries of extremities with intermittent claudication, unspecified extremity: Secondary | ICD-10-CM

## 2015-04-11 LAB — BASIC METABOLIC PANEL
Anion gap: 8 (ref 5–15)
BUN: 15 mg/dL (ref 6–20)
CO2: 27 mmol/L (ref 22–32)
Calcium: 9 mg/dL (ref 8.9–10.3)
Chloride: 104 mmol/L (ref 101–111)
Creatinine, Ser: 0.91 mg/dL (ref 0.44–1.00)
GFR calc Af Amer: >60 mL/min (ref >60)
GFR calc non Af Amer: >60 mL/min (ref >60)
Glucose, Bld: 115 mg/dL — ABNORMAL HIGH (ref 65–99)
Potassium: 3.8 mmol/L (ref 3.5–5.1)
Sodium: 139 mmol/L (ref 135–145)

## 2015-04-11 LAB — CBC
HCT: 36.2 % (ref 35.0–47.0)
Hemoglobin: 11.8 g/dL — ABNORMAL LOW (ref 12.0–16.0)
MCH: 27.3 pg (ref 26.0–34.0)
MCHC: 32.6 g/dL (ref 32.0–36.0)
MCV: 83.9 fL (ref 80.0–100.0)
Platelets: 322 10*3/uL (ref 150–440)
RBC: 4.31 MIL/uL (ref 3.80–5.20)
RDW: 15.5 % — ABNORMAL HIGH (ref 11.5–14.5)
WBC: 5.5 10*3/uL (ref 3.6–11.0)

## 2015-04-11 LAB — FIBRIN DERIVATIVES D-DIMER (ARMC ONLY): Fibrin derivatives D-dimer (ARMC): 1296 — ABNORMAL HIGH (ref 0–499)

## 2015-04-11 MED ORDER — TRAMADOL HCL 50 MG PO TABS: 50.0000 mg | ORAL_TABLET | Freq: Four times a day (QID) | ORAL | Status: DC | PRN

## 2015-04-11 NOTE — ED Provider Notes (Signed)
[facility] Emergency Department Provider Note  Time seen: 2:28 PM  I have reviewed the triage vital signs and the nursing notes.   HISTORY  Chief Complaint Leg Pain    HPI Catherine Green is a 74 y.o. female with a past medical history of hyperlipidemia, coronary artery disease, hypertension, thrombophlebitis, depression, DVT, anxiety, carotid endarterectomies, presents the emergency department left lower leg pain. According to the patient for the past 1-2 months she has had left leg pain, worse with ambulation, improves with rest. She noted some swelling over the last few weeks, she called her primary care doctor yesterday who recommended she come to the emergency department for evaluation. Patient states her leg pain is moderate when walking, gone at rest. Denies any chest pain or shortness of breath. Describes the pain as a pulling sensation in her calf and behind her knee.     Past Medical History  Diagnosis Date  . Hyperlipidemia   . Coronary artery disease   . Hypertension   . PVD (peripheral vascular disease)     endarterectomy by [name]  . Pre-syncope   . Thrombophlebitis     following childbirth  . Cerebrovascular disease     extracranial; occlusive  . Ulcer   . Dizziness   . Chicken pox   . Depression   . Fainting spell   . Allergy     hay fever  . Anemia   . DVT (deep venous thrombosis)   . Heart murmur   . PONV (postoperative nausea and vomiting)     severe nausea and vomiting  . Shortness of breath dyspnea     wth ambulation at times  . Dizziness   . Anxiety   . GERD (gastroesophageal reflux disease)   . Swelling of both ankles     and abdomen; takes Lasix when needed  . Seasonal allergies   . Headache   . Carotid artery occlusion     Patient Active Problem List   Diagnosis Date Noted  . Leg pain, bilateral 02/08/2015  . Dyspnea 02/08/2015  . Low back pain 11/05/2014  . Benign paroxysmal positional vertigo 09/04/2014   . Lightheaded 09/04/2014  . Carotid stenosis 07/03/2014  . Preoperative cardiovascular examination 03/07/2014  . Dysphagia, pharyngoesophageal phase 12/25/2013  . Acute on chronic diastolic CHF (congestive heart failure) 11/23/2013  . Palpitations 11/23/2013  . Urticaria 04/05/2013  . Screening for colon cancer 05/27/2012  . Hyperlipidemia 05/04/2012  . Anxiety 03/10/2012  . Occlusion and stenosis of carotid artery without mention of cerebral infarction 12/22/2011  . Other and unspecified hyperlipidemia 10/08/2009  . Essential hypertension 10/08/2009  . CAD, ARTERY BYPASS GRAFT 10/08/2009  . PVD 10/08/2009  . SYNCOPE, HX OF 10/08/2009  . THROMBOPHLEBITIS, HX OF 10/08/2009    Past Surgical History  Procedure Laterality Date  . Endarterectomy  2006    [name]  . Septoplasty      with bilateral inferior turbinate reductions  . Appendectomy    . Breast surgery      saline implants  . Cataract extraction, bilateral  2013  . Upper gi endoscopy    . Cholecystectomy, laparoscopic  04/02/14    Dr. Rochel Brome  . Coronary artery bypass graft  2006    x3 Dr. Prescott Gum  . Total abdominal hysterectomy    . Cholecystectomy  Oct. 15, 2015    Gall Bladder  . Carotid endarterectomy  1992  . Carotid endarterectomy Right February 13, 2005  Re-do Right CE  . Eye surgery Bilateral Feb. 2013    Cataract Left eye  . Cardiac catheterization    . Upper gastrointestinal endoscopy      had polyps removed from esophagus  . Endarterectomy Left 07/20/2014    Procedure: ENDARTERECTOMY CAROTID-LEFT;  Surgeon: Rosetta Posner, MD;  Location: [facility];  Service: Vascular;  Laterality: Left;    Current Outpatient Rx  Name  Route  Sig  Dispense  Refill  . aspirin 81 MG tablet   Oral   Take 162 mg by mouth daily.          . carvedilol (COREG) 3.125 MG tablet      Take 1/2 tab by mouth twice daily   90 tablet   3   . Coenzyme Q10 (CO Q 10) 100 MG CAPS   Oral   Take by mouth.         .  furosemide (LASIX) 20 MG tablet      TAKE 1 TABLET (20 MG TOTAL) BY MOUTH DAILY   30 tablet   3   . potassium chloride SA (K-DUR,KLOR-CON) 20 MEQ tablet   Oral   Take 1 tablet (20 mEq total) by mouth daily. With lasix Patient taking differently: Take 20 mEq by mouth daily. With lasix   30 tablet   3   . ranitidine (ZANTAC) 150 MG tablet   Oral   Take 1 tablet (150 mg total) by mouth 2 (two) times daily as needed for heartburn (or abdominal pain).           Allergies Atorvastatin; Codeine; Demerol; Oxycodone hcl; Rosuvastatin; Protonix; Augmentin; Ivp dye; Shellfish allergy; Trazodone and nefazodone; and Xanax  Family History  Problem Relation Age of Onset  . Aneurysm Mother     brain  . Heart disease Mother     Aneyursm   . Hyperlipidemia Mother   . Hypertension Mother   . Varicose Veins Mother   . Bleeding Disorder Mother   . Heart disease Father   . Cirrhosis Father   . Heart attack Father   . Hyperlipidemia Father   . Hypertension Father   . Heart disease Brother     Before age 15  . Aneurysm Brother   . Deep vein thrombosis Brother   . Birth defects Brother   . Hyperlipidemia Brother   . Hypertension Brother   . Hypertension Other   . Cancer Sister     lung  . Heart disease Sister     Aneurysm  . Hyperlipidemia Sister   . Hypertension Sister   . Varicose Veins Sister   . Breast cancer Neg Hx   . Colon cancer Neg Hx   . Peripheral vascular disease Daughter   . Hyperlipidemia Daughter   . Hypertension Daughter   . Bleeding Disorder Sister     Social History Social History  Substance Use Topics  . Smoking status: Former Smoker -- 0.30 packs/day for 15 years    Types: Cigarettes    Quit date: 08/31/1996  . Smokeless tobacco: Never Used  . Alcohol Use: No    Review of Systems Constitutional: Negative for fever Cardiovascular: Negative for chest pain. Respiratory: Negative for shortness of breath. Gastrointestinal: Negative for abdominal  pain Neurological: Negative for headache 10-point ROS otherwise negative.  ____________________________________________   PHYSICAL EXAM:  VITAL SIGNS: ED Triage Vitals  Enc Vitals Group     BP 04/11/15 1042 185/68 mmHg     Pulse Rate 04/11/15 1042 70  Resp 04/11/15 1042 20     Temp 04/11/15 1042 98.2 F (36.8 C)     Temp Source 04/11/15 1042 Oral     SpO2 04/11/15 1042 97 %     Weight 04/11/15 1042 159 lb (72.122 kg)     Height 04/11/15 1042 5\' 2"  (1.575 m)     Head Cir --      Peak Flow --      Pain Score 04/11/15 1034 6     Pain Loc --      Pain Edu? --      Excl. in [place]? --     Constitutional: Alert and oriented. Well appearing and in no distress. Eyes: Normal exam ENT   Head: Normocephalic and atraumatic Cardiovascular: Normal rate, regular rhythm.  Respiratory: Normal respiratory effort without tachypnea nor retractions. Breath sounds are clear  Gastrointestinal: Soft and nontender. No distention.   Musculoskeletal: Nontender with normal range of motion in all extremities. No lower extremity tenderness on exam. No edema noted bilaterally. Neurologic:  Normal speech and language. No gross focal neurologic deficits  Skin:  Skin is warm, dry and intact.  Psychiatric: Mood and affect are normal. Speech and behavior are normal.  ____________________________________________   RADIOLOGY  Ultrasound of the left leg shows no DVT.  ____________________________________________   INITIAL IMPRESSION / ASSESSMENT AND PLAN / ED COURSE  Pertinent labs & imaging results that were available during my care of the patient were reviewed by me and considered in my medical decision making (see chart for details).  Patient presents with left leg pain, worse with ambulation, improves with rest. Symptoms are most suggestive of claudication. Patient's ultrasound is normal, no DVT. Patient's labs are within normal limits besides an elevated d-dimer. I discussed with the patient  likely claudication. She sees Dr. early, a vascular surgeon who is our he performed her endarterectomies, she states she will call him today for follow-up this coming week. We will discharge the patient home with Ultram as needed. I discussed with the patient to limit exertional activity, but to remain mildly active.  ____________________________________________   FINAL CLINICAL IMPRESSION(S) / ED DIAGNOSES  Claudication Left leg pain   Harvest Dark, MD 04/11/15 1432

## 2015-04-11 NOTE — ED Notes (Signed)
Patient to ED with c/o left leg pain and swelling although reports swelling is not as significant today. Patient reports symptoms for about a month, saw PMD yesterday and was told to come here.

## 2015-04-11 NOTE — Telephone Encounter (Signed)
Phone call from pt.  Reported she was seen in the ER today for pain in her left leg.  Reported she has had swelling in her bilat. feet, up to her calves.  Stated today, she noticed redness in the left calf area, and had "toothache-type pain" from her left knee down to her foot.  Reported she has had pain in both legs in the past, but since she stopped the Crestor, the right leg has gotten better.  Also reported the swelling in the right LE is improved.  Stated she went to the ER today due to the swelling, pain, and redness in the left leg.  Denied any open sores.  Stated she sometimes has pain in her left leg at rest;  reported the pain is approx. "one hand-width above the knee, on the inside of the left leg, and up into the thigh."  Also reported she has left leg pain with walking from her car to the building elevator, and if she pauses to rest, it eases-up. Stated the ER told her she didn't have a blood clot, but to make an appt. with [name] ASAP.  Appt. Offered for 04/30/15 @ 9:30 AM for ABI's, and @ 11:45 AM with [name]; and advised will place on a cancellation list.  Pt. Agreed with plan.

## 2015-04-11 NOTE — Discharge Instructions (Signed)
Intermittent Claudication Blockage of leg arteries results from poor circulation of blood in the leg arteries. This produces an aching, tired, and sometimes burning pain in the legs that is brought on by exercise and made better by rest. Claudication refers to the limping that happens from leg cramps. It is also referred to as Vaso-occlusive disease of the legs, arterial insufficiency of the legs, recurrent leg pain, recurrent leg cramping and calf pain with exercise.  CAUSES  This condition is due to narrowing or blockage of the arteries (muscular vessels which carry blood away from the heart and around the body). Blockage of arteries can occur anywhere in the body. If they occur in the heart, a person may experience angina (chest pain) or even a heart attack. If they occur in the neck or the brain, a person may have a stroke. Intermittent claudication is when the blockage occurs in the legs, most commonly in the calf or the foot.  Atherosclerosis, or blockage of arteries, can occur for many reasons. Some of these are smoking, diabetes, and high cholesterol. SYMPTOMS  Intermittent claudication may occur in both legs, and it often continues to get worse over time. However, some people complain only of weakness in the legs when walking, or a feeling of "tiredness" in the buttocks. Impotence (not able to have an erection) is an occasional complaint in men. Pain while resting is uncommon.  WHAT TO EXPECT AT YOUR HEALTH CARE PROVIDER'S OFFICE: Your medical history will be asked for and a physical examination will be performed. Medical history questions documenting claudication in detail may include:   Time pattern  Do you have leg cramps at night (nocturnal cramps)?  How often does leg pain with cramping occur?  Is it getting worse?  What is the quality of the pain?  Is the pain sharp?  Is there an aching pain with the cramps?  Aggravating factors  Is it worse after you exercise?  Is it  worse after you are standing for a while?  Do you smoke? How much?  Do you drink alcohol? How much?  Are you diabetic? How well is your blood sugar controlled?  Other  What other symptoms are also present?  Has there been impotence (men)?  Is there pain in the back?  Is there a darkening of the skin of the legs, feet or toes?  Is there weakness or paralysis of the legs? The physical examination may include evaluation of the femoral pulse (in the groin) and the other areas where the pulse can be felt in the legs. DIAGNOSIS  Diagnostic tests that may be performed include:  Blood pressure measured in arms and legs for comparison.  Doppler ultrasonography on the legs and the heart.  Duplex Doppler/ultrasound exam of extremity to visualize arterial blood flow.  ECG- to evaluate the activity of your heart.  Aortography- to visualize blockages in your arteries. TREATMENT Surgical treatment may be suggested if claudication interferes with the patient's activities or work, and if the diseased arteries do not seem to be improving after treatment. Be aware that this condition can worsen over time and you should carefully monitor your condition. HOME CARE INSTRUCTIONS  Talk to your caregiver about the cause of your leg cramping and about what to do at home to relieve it.  A healthy diet is important to lessen the likeliness of atherosclerosis.  A program of daily walking for short periods, and stopping for pain or cramping, may help improve function.  It is important to   stop smoking.  Avoid putting hot or cold items on legs.  Avoid tight shoes. SEEK MEDICAL CARE IF: There are many other causes of leg pain such as arthritis or low blood potassium. However, some causes of leg pain may be life threatening such as a blood clot in the legs. Seek medical attention if you have:  Leg pain that does not go away.  Legs that may be red, hot or swollen.  Ulcers or sores appear on your  ankle or foot.  Any chest pain or shortness of breath accompanying leg pain.  Diabetes.  You are pregnant. SEEK IMMEDIATE MEDICAL CARE IF:   Your leg pain becomes severe or will not go away.  Your foot turns blue or a dark color.  Your leg becomes red, hot or swollen or you develop a fever over 102F.  Any chest pain or shortness of breath accompanying leg pain. MAKE SURE YOU:   Understand these instructions.  Will watch your condition.  Will get help right away if you are not doing well or get worse. Document Released: 06/19/2004 Document Revised: 11/09/2011 Document Reviewed: 11/23/2013 ExitCare Patient Information 2015 [employer]. This information is not intended to replace advice given to you by your health care provider. Make sure you discuss any questions you have with your health care provider.  

## 2015-04-12 ENCOUNTER — Other Ambulatory Visit: Payer: Self-pay | Admitting: Cardiovascular Disease

## 2015-04-29 ENCOUNTER — Encounter: Payer: Self-pay | Admitting: Vascular Surgery

## 2015-04-30 ENCOUNTER — Encounter: Payer: Self-pay | Admitting: Vascular Surgery

## 2015-04-30 ENCOUNTER — Ambulatory Visit (INDEPENDENT_AMBULATORY_CARE_PROVIDER_SITE_OTHER): Payer: Commercial Managed Care - HMO | Admitting: Vascular Surgery

## 2015-04-30 ENCOUNTER — Ambulatory Visit (HOSPITAL_COMMUNITY)
Admission: RE | Admit: 2015-04-30 | Discharge: 2015-04-30 | Disposition: A | Discharge status: Home / Self Care | Payer: Commercial Managed Care - HMO | Source: Ambulatory Visit | Attending: Vascular Surgery | Admitting: Vascular Surgery

## 2015-04-30 VITALS — BP 185/78 | HR 63 | Temp 97.3°F | Resp 18 | Ht 63.5 in | Wt 155.2 lb

## 2015-04-30 DIAGNOSIS — I70219 Atherosclerosis of native arteries of extremities with intermittent claudication, unspecified extremity: Secondary | ICD-10-CM | POA: Diagnosis not present

## 2015-04-30 DIAGNOSIS — M25569 Pain in unspecified knee: Secondary | ICD-10-CM | POA: Diagnosis not present

## 2015-04-30 NOTE — Progress Notes (Signed)
Patient resents today for discussion of her lower extremity symptoms. She is well-known to me from prior carotid surgery. Most recently she underwent left carotid endarterectomy November 2015. On my last visit with her in June of this year. Her endarterectomy sites look quite good. At that time she did report some discomfort in her legs with walking. On physical exam she had normal pedal pulses. I have recommended that we would check her ankle arm indices with her next carotid duplex evaluation in December. At that time it was not limiting to her. She is now had the significant worsening in her leg symptoms. She reports that this can occur with minimal walking. She reports having a difficult time walking from her car into a shopping center. This does described as diffuse aching throughout both legs. She also reports significant tenderness over medial thighs with the palpation. No tissue loss. She does report occasional difficulty with low back pain and on one occasion reports that this made it difficult for her to walk and feel her legs.  Past Medical History  Diagnosis Date  . Hyperlipidemia   . Coronary artery disease   . Hypertension   . PVD (peripheral vascular disease)     endarterectomy by [name]  . Pre-syncope   . Thrombophlebitis     following childbirth  . Cerebrovascular disease     extracranial; occlusive  . Ulcer   . Dizziness   . Chicken pox   . Depression   . Fainting spell   . Allergy     hay fever  . Anemia   . DVT (deep venous thrombosis)   . Heart murmur   . PONV (postoperative nausea and vomiting)     severe nausea and vomiting  . Shortness of breath dyspnea     wth ambulation at times  . Dizziness   . Anxiety   . GERD (gastroesophageal reflux disease)   . Swelling of both ankles     and abdomen; takes Lasix when needed  . Seasonal allergies   . Headache   . Carotid artery occlusion     Social History  Substance Use Topics  . Smoking status: Former Smoker  -- 0.30 packs/day for 15 years    Types: Cigarettes    Quit date: 08/31/1996  . Smokeless tobacco: Never Used  . Alcohol Use: No    Family History  Problem Relation Age of Onset  . Aneurysm Mother     brain  . Heart disease Mother     Aneyursm   . Hyperlipidemia Mother   . Hypertension Mother   . Varicose Veins Mother   . Bleeding Disorder Mother   . Heart disease Father   . Cirrhosis Father   . Heart attack Father   . Hyperlipidemia Father   . Hypertension Father   . Heart disease Brother     Before age 9  . Aneurysm Brother   . Deep vein thrombosis Brother   . Birth defects Brother   . Hyperlipidemia Brother   . Hypertension Brother   . Hypertension Other   . Cancer Sister     lung  . Heart disease Sister     Aneurysm  . Hyperlipidemia Sister   . Hypertension Sister   . Varicose Veins Sister   . Breast cancer Neg Hx   . Colon cancer Neg Hx   . Peripheral vascular disease Daughter   . Hyperlipidemia Daughter   . Hypertension Daughter   . Bleeding Disorder Sister  Allergies  Allergen Reactions  . Atorvastatin Nausea And Vomiting  . Codeine Nausea And Vomiting  . Demerol Nausea And Vomiting  . Oxycodone Hcl Other (See Comments)    Hallucinations   . Rosuvastatin Nausea And Vomiting and Other (See Comments)    HIGHER DOSE-Causes Leg cramps  . Protonix [Pantoprazole Sodium] Nausea Only  . Augmentin [Amoxicillin-Pot Clavulanate] Nausea And Vomiting  . Ivp Dye [Iodinated Diagnostic Agents]     Hives, itching  . Shellfish Allergy     Hives, itching  . Trazodone And Nefazodone Other (See Comments)    Sedation   . Xanax [Alprazolam]     Sedation, even at low dose     Current outpatient prescriptions:  .  aspirin 81 MG tablet, Take 162 mg by mouth daily. , Disp: , Rfl:  .  carvedilol (COREG) 3.125 MG tablet, Take 1/2 tab by mouth twice daily, Disp: 90 tablet, Rfl: 3 .  Coenzyme Q10 (CO Q 10) 100 MG CAPS, Take by mouth., Disp: , Rfl:  .  furosemide  (LASIX) 20 MG tablet, TAKE 1 TABLET (20 MG TOTAL) BY MOUTH DAILY, Disp: 30 tablet, Rfl: 3 .  potassium chloride SA (K-DUR,KLOR-CON) 20 MEQ tablet, Take 1 tablet (20 mEq total) by mouth daily. With lasix (Patient taking differently: Take 20 mEq by mouth daily. With lasix), Disp: 30 tablet, Rfl: 3 .  ranitidine (ZANTAC) 150 MG tablet, Take 1 tablet (150 mg total) by mouth 2 (two) times daily as needed for heartburn (or abdominal pain)., Disp: , Rfl:  .  traMADol (ULTRAM) 50 MG tablet, Take 1 tablet (50 mg total) by mouth every 6 (six) hours as needed., Disp: 20 tablet, Rfl: 0 .  hydrochlorothiazide (HYDRODIURIL) 50 MG tablet, TAKE 1 TABLET (50 MG TOTAL) BY MOUTH DAILY. (Patient not taking: Reported on 04/30/2015), Disp: 90 tablet, Rfl: 3  Filed Vitals:   04/30/15 1003 04/30/15 1006  BP: 178/77 185/78  Pulse: 63   Temp: 97.3 F (36.3 C)   TempSrc: Oral   Resp: 18   Height: 5' 3.5" (1.613 m)   Weight: 155 lb 3.2 oz (70.398 kg)   SpO2: 98%     Body mass index is 27.06 kg/(m^2).       On physical exam she is a well-developed well-nourished female no acute distress Grossly intact neurologically Pulse status 2+ radial and 2+ dorsalis pedis pulses bilaterally  She underwent noninvasive studies in our office today. This reveals normal ankle arm index bilaterally at rest and triphasic and biphasic waveforms bilaterally.  Impression and plan: Bilateral lower extremity leg discomfort of unclear etiology. I discussed this at length with the patient. Explained that the her symptoms do not correspond with arterial insufficiency. She has a normal physical exam and normal noninvasive studies. I explained that this could miss very mild atherosclerotic disease but her symptoms certainly do not correspond with this. I explained that this could be related to possible degenerative disc disease or other neurologic issue. She will follow-up with Dr. Damita Dunnings for further evaluation. We will see her as scheduled  in December 2016 for repeat carotid duplex.

## 2015-05-02 ENCOUNTER — Ambulatory Visit (INDEPENDENT_AMBULATORY_CARE_PROVIDER_SITE_OTHER): Payer: Commercial Managed Care - HMO | Admitting: Family Medicine

## 2015-05-02 ENCOUNTER — Encounter: Payer: Self-pay | Admitting: Family Medicine

## 2015-05-02 ENCOUNTER — Ambulatory Visit (INDEPENDENT_AMBULATORY_CARE_PROVIDER_SITE_OTHER)
Admission: RE | Admit: 2015-05-02 | Discharge: 2015-05-02 | Disposition: A | Discharge status: Home / Self Care | Payer: Commercial Managed Care - HMO | Source: Ambulatory Visit | Attending: Family Medicine | Admitting: Family Medicine

## 2015-05-02 VITALS — BP 106/58 | HR 64 | Wt 155.4 lb

## 2015-05-02 DIAGNOSIS — M545 Low back pain: Secondary | ICD-10-CM

## 2015-05-02 NOTE — Assessment & Plan Note (Signed)
I question if she has spinal stenosis.  Check plain films and then will likely need CT vs MRI L spine.  This is likely not vascular or from statin.  Ddx dw pt, esp spinal stenosis. She agrees with plan. >25 minutes spent in face to face time with patient, >50% spent in counselling or coordination of care.

## 2015-05-02 NOTE — Patient Instructions (Signed)
Go to the lab on the way out.  We'll contact you with your xray report. We may need to some extra pictures set up.  I need to see your regular xray first.

## 2015-05-02 NOTE — Progress Notes (Signed)
Pre visit review using our clinic review tool, if applicable. No additional management support is needed unless otherwise documented below in the visit note.  B leg aches.  Off statin, still with worsening leg aches.  Now with calf and medial thigh aches B.  Also with lower midline back pain.  Vascular studies w/o cause seen, ie likely not claudication.   Back pain is better when she sits down.  Worse standing, when she gets up.   No trauma.  Tramadol didn't help, she couldn't tolerate it.   D/w pt about ddx.  Vascular notes d/w pt at [facility], esp re: lower leg vasc testing w/o sig stenosis.   Meds, vitals, and allergies reviewed.   ROS: See HPI.  Otherwise, noncontributory.  nad ncat rrr ctab abd soft, not ttp Back with lower midline tenderness w/o redness or bruising BLE without edema.  S/S wnl BLE Pain with ROM, ie B hip flexion, but no weakness.

## 2015-05-03 ENCOUNTER — Other Ambulatory Visit: Payer: Self-pay | Admitting: Family Medicine

## 2015-05-03 ENCOUNTER — Encounter: Payer: Self-pay | Admitting: Family Medicine

## 2015-05-03 DIAGNOSIS — M545 Low back pain: Secondary | ICD-10-CM

## 2015-05-07 ENCOUNTER — Ambulatory Visit: Payer: Commercial Managed Care - HMO | Admitting: Vascular Surgery

## 2015-05-07 ENCOUNTER — Encounter (HOSPITAL_COMMUNITY): Payer: Commercial Managed Care - HMO

## 2015-05-10 ENCOUNTER — Other Ambulatory Visit: Payer: Self-pay | Admitting: Family Medicine

## 2015-05-10 DIAGNOSIS — F419 Anxiety disorder, unspecified: Secondary | ICD-10-CM

## 2015-05-10 MED ORDER — SERTRALINE HCL 25 MG PO TABS: 25.0000 mg | ORAL_TABLET | Freq: Every day | ORAL | Status: DC

## 2015-05-10 NOTE — Assessment & Plan Note (Addendum)
Called and talked to patient.  "My nerves are shot."  No SI/HI but anxious/frustrated/tearful with ongoing back pain.  She has mult family members that had improved on zoloft.  She wanted to try.  This is reasonable.  Routine cautions given and she'll start 25mg  a day, can update me as needed.  She agrees.  She thanked me for the call.

## 2015-05-15 ENCOUNTER — Ambulatory Visit: Payer: Commercial Managed Care - HMO

## 2015-05-17 ENCOUNTER — Ambulatory Visit (INDEPENDENT_AMBULATORY_CARE_PROVIDER_SITE_OTHER): Payer: Commercial Managed Care - HMO | Admitting: Cardiovascular Disease

## 2015-05-17 ENCOUNTER — Encounter: Payer: Self-pay | Admitting: Cardiovascular Disease

## 2015-05-17 VITALS — BP 150/60 | HR 64 | Ht 62.0 in | Wt 155.0 lb

## 2015-05-17 DIAGNOSIS — I25810 Atherosclerosis of coronary artery bypass graft(s) without angina pectoris: Secondary | ICD-10-CM | POA: Diagnosis not present

## 2015-05-17 DIAGNOSIS — I6523 Occlusion and stenosis of bilateral carotid arteries: Secondary | ICD-10-CM

## 2015-05-17 DIAGNOSIS — R030 Elevated blood-pressure reading, without diagnosis of hypertension: Secondary | ICD-10-CM

## 2015-05-17 DIAGNOSIS — E785 Hyperlipidemia, unspecified: Secondary | ICD-10-CM

## 2015-05-17 DIAGNOSIS — R0602 Shortness of breath: Secondary | ICD-10-CM | POA: Diagnosis not present

## 2015-05-17 DIAGNOSIS — I10 Essential (primary) hypertension: Secondary | ICD-10-CM

## 2015-05-17 DIAGNOSIS — R0989 Other specified symptoms and signs involving the circulatory and respiratory systems: Secondary | ICD-10-CM | POA: Insufficient documentation

## 2015-05-17 DIAGNOSIS — E7849 Other hyperlipidemia: Secondary | ICD-10-CM | POA: Insufficient documentation

## 2015-05-17 NOTE — Assessment & Plan Note (Signed)
She reports labile blood pressure. Very elevated on today's visit. She is reluctant to start additional medications as she reports it will be very low at home. Recommended she check her blood pressure daily, right is numbers down and call our office in the next few days. Last office note with primary care, blood pressure was very low. Wonder if she stopped the HCTZ. She felt this was making her dehydrated

## 2015-05-17 NOTE — Assessment & Plan Note (Signed)
Currently unable to tolerate Crestor or other statins. Significant leg discomfort. Given her PAD, CAD, she is high risk of recurrent disease. We will check lipids with her not on Crestor next week. Based on these numbers, she may be a candidate for praluent or repatha

## 2015-05-17 NOTE — Assessment & Plan Note (Signed)
Covered by vascular surgery in [facility]

## 2015-05-17 NOTE — Patient Instructions (Signed)
Blood pressure is elevated No medication changes were made.  Please monitor BP closely  Call the office with numbers  Come in next week for liver and lipids  Please call us if you have new issues that need to be addressed before your next appt.  Your physician wants you to follow-up in: 6 months.  You will receive a reminder letter in the mail two months in advance. If you don't receive a letter, please call our office to schedule the follow-up appointment.

## 2015-05-17 NOTE — Progress Notes (Signed)
Patient ID: Catherine Green, female    DOB: May 03, 1941, 74 y.o.   MRN: [id number]  HPI Comments: [name] is a very pleasant 74 year-old woman with history coronary artery disease, bypass surgery, peripheral vascular disease with history of right carotid endarterectomy, as well as  left CEA followed by [name], who presents for routine followup of her coronary artery disease. Last catheterization in January 2009 showing patent vein grafts, atretic LIMA to the LAD.     problems with anxiety in the past  In follow-up today, she is no longer taking Crestor as it made her legs weak, leg pain She has significant back discomfort, scheduled for MRI to rule out stenosis  After her last clinic visit, went on a cruise and developed pneumonia, required ABX Reports having labile blood pressure. One minute is low, next minute high. Elevated on today's visit even on recheck in bilateral was systolic pressure 952W Note from primary care with low blood pressure. She reports this was confirmed She is not checking it on a regular basis at home, forgot her numbers  EKG on today's visit shows normal sinus rhythm with rate 64 bpm, nonspecific ST abnormality anterolateral leads  Other past medical history Previous symptoms of shortness of breath and weight gain. Improved with Lasix to take as needed.  She had her gallbladder taken out 03/13/2014.  she had "hair loss" on simvastatin, as well as sores on her head. Lipitor caused leg problems/myalgias.  She is not particularly interested in fenofibrate, WelChol. Too many pills.  She has had problems with zetia.   Previously stopped her "nerve" pill. Significant stressors as husband is sick. She was previously using the treadmill on a regular basis       Allergies  Allergen Reactions  . Atorvastatin Nausea And Vomiting  . Codeine Nausea And Vomiting  . Demerol Nausea And Vomiting  . Oxycodone Hcl Other (See Comments)    Hallucinations   . Protonix  [Pantoprazole Sodium] Nausea Only  . Augmentin [Amoxicillin-Pot Clavulanate] Nausea And Vomiting  . Ivp Dye [Iodinated Diagnostic Agents]     Hives, itching  . Shellfish Allergy     Hives, itching  . Tramadol Other (See Comments)    Sedation   . Trazodone And Nefazodone Other (See Comments)    Sedation   . Xanax [Alprazolam]     Sedation, even at low dose    Outpatient Encounter Prescriptions as of 05/17/2015  Medication Sig  . aspirin 81 MG tablet Take 162 mg by mouth daily.   . carvedilol (COREG) 3.125 MG tablet Take 1/2 tab by mouth twice daily  . Coenzyme Q10 (CO Q 10) 100 MG CAPS Take by mouth.  . furosemide (LASIX) 20 MG tablet TAKE 1 TABLET (20 MG TOTAL) BY MOUTH DAILY  . hydrochlorothiazide (HYDRODIURIL) 50 MG tablet TAKE 1 TABLET (50 MG TOTAL) BY MOUTH DAILY.  Marland Kitchen potassium chloride (KLOR-CON) 20 MEQ packet Take 20 mEq by mouth daily.  . ranitidine (ZANTAC) 150 MG tablet Take 1 tablet (150 mg total) by mouth 2 (two) times daily as needed for heartburn (or abdominal pain).  Marland Kitchen sertraline (ZOLOFT) 25 MG tablet Take 1 tablet (25 mg total) by mouth daily.  . [DISCONTINUED] potassium chloride SA (K-DUR,KLOR-CON) 20 MEQ tablet Take 1 tablet (20 mEq total) by mouth daily. With lasix (Patient not taking: Reported on 05/17/2015)   No facility-administered encounter medications on file as of 05/17/2015.    Past Medical History  Diagnosis Date  .  Hyperlipidemia   . Coronary artery disease   . Hypertension   . PVD (peripheral vascular disease)     endarterectomy by [name]  . Pre-syncope   . Thrombophlebitis     following childbirth  . Cerebrovascular disease     extracranial; occlusive  . Ulcer   . Dizziness   . Chicken pox   . Depression   . Fainting spell   . Allergy     hay fever  . Anemia   . DVT (deep venous thrombosis)   . Heart murmur   . PONV (postoperative nausea and vomiting)     severe nausea and vomiting  . Shortness of breath dyspnea     wth ambulation at  times  . Dizziness   . Anxiety   . GERD (gastroesophageal reflux disease)   . Swelling of both ankles     and abdomen; takes Lasix when needed  . Seasonal allergies   . Headache   . Carotid artery occlusion     Past Surgical History  Procedure Laterality Date  . Septoplasty      with bilateral inferior turbinate reductions  . Appendectomy    . Breast surgery      saline implants  . Cataract extraction, bilateral  2013  . Upper gi endoscopy    . Cholecystectomy, laparoscopic  04/02/14    Dr. Rochel Brome  . Coronary artery bypass graft  2006    x3 Dr. Prescott Gum  . Total abdominal hysterectomy    . Carotid endarterectomy  1992  . Carotid endarterectomy Right February 13, 2005    Re-do Right CE  . Eye surgery Bilateral Feb. 2013    Cataract Left eye  . Cardiac catheterization    . Upper gastrointestinal endoscopy      had polyps removed from esophagus  . Endarterectomy Left 07/20/2014    Procedure: ENDARTERECTOMY CAROTID-LEFT;  Surgeon: Rosetta Posner, MD;  Location: [facility];  Service: Vascular;  Laterality: Left;    Social History  reports that she quit smoking about 18 years ago. Her smoking use included Cigarettes. She has a 4.5 pack-year smoking history. She has never used smokeless tobacco. She reports that she does not drink alcohol or use illicit drugs.  Family History family history includes Aneurysm in her brother and mother; Birth defects in her brother; Bleeding Disorder in her mother and sister; Cancer in her sister; Cirrhosis in her father; Deep vein thrombosis in her brother; Heart attack in her father; Heart disease in her brother, father, mother, and sister; Hyperlipidemia in her brother, daughter, father, mother, and sister; Hypertension in her brother, daughter, father, mother, other, and sister; Peripheral vascular disease in her daughter; Varicose Veins in her mother and sister. There is no history of Breast cancer or Colon cancer.   Review of Systems   Constitutional: Negative.   Respiratory: Negative.   Cardiovascular: Negative.   Gastrointestinal: Negative.   Musculoskeletal: Negative.   Skin: Negative.   Neurological: Negative.   Hematological: Negative.   Psychiatric/Behavioral: Negative.   All other systems reviewed and are negative.   BP 150/60 mmHg  Pulse 64  Ht 5\' 2"  (1.575 m)  Wt 155 lb (70.308 kg)  BMI 28.34 kg/m2  Physical Exam  Constitutional: She is oriented to person, place, and time. She appears well-developed and well-nourished.  HENT:  Head: Normocephalic.  Nose: Nose normal.  Mouth/Throat: Oropharynx is clear and moist.  Eyes: Conjunctivae are normal. Pupils are equal, round, and reactive to light.  Neck: Normal range of motion. Neck supple. No JVD present. Carotid bruit is present.  Cardiovascular: Normal rate, regular rhythm, S1 normal, S2 normal and intact distal pulses.  Exam reveals no gallop and no friction rub.   Murmur heard.  Systolic murmur is present with a grade of 2/6  Pulmonary/Chest: Effort normal and breath sounds normal. No respiratory distress. She has no wheezes. She has no rales. She exhibits no tenderness.  Abdominal: Soft. Bowel sounds are normal. She exhibits no distension. There is no tenderness.  Musculoskeletal: Normal range of motion. She exhibits no edema or tenderness.  Lymphadenopathy:    She has no cervical adenopathy.  Neurological: She is alert and oriented to person, place, and time. Coordination normal.  Skin: Skin is warm and dry. No rash noted. No erythema.  Psychiatric: She has a normal mood and affect. Her behavior is normal. Judgment and thought content normal.    Assessment and Plan  Nursing note and vitals reviewed.

## 2015-05-17 NOTE — Assessment & Plan Note (Signed)
Currently with no symptoms of angina. No further workup at this time. Continue current medication regimen. 

## 2015-05-22 ENCOUNTER — Other Ambulatory Visit (INDEPENDENT_AMBULATORY_CARE_PROVIDER_SITE_OTHER): Payer: Commercial Managed Care - HMO

## 2015-05-22 DIAGNOSIS — I10 Essential (primary) hypertension: Secondary | ICD-10-CM

## 2015-05-22 DIAGNOSIS — R0602 Shortness of breath: Secondary | ICD-10-CM

## 2015-05-22 DIAGNOSIS — I25810 Atherosclerosis of coronary artery bypass graft(s) without angina pectoris: Secondary | ICD-10-CM | POA: Diagnosis not present

## 2015-05-23 ENCOUNTER — Telehealth: Payer: Self-pay | Admitting: Family Medicine

## 2015-05-23 DIAGNOSIS — M545 Low back pain: Secondary | ICD-10-CM

## 2015-05-23 LAB — HEPATIC FUNCTION PANEL
ALT: 17 IU/L (ref 0–32)
AST: 19 IU/L (ref 0–40)
Albumin: 4.4 g/dL (ref 3.5–4.8)
Alkaline Phosphatase: 97 IU/L (ref 39–117)
Bilirubin Total: 0.2 mg/dL (ref 0.0–1.2)
Bilirubin, Direct: 0.09 mg/dL (ref 0.00–0.40)
Total Protein: 6.7 g/dL (ref 6.0–8.5)

## 2015-05-23 LAB — LIPID PANEL
Chol/HDL Ratio: 5.7 ratio units — ABNORMAL HIGH (ref 0.0–4.4)
Cholesterol, Total: 261 mg/dL — ABNORMAL HIGH (ref 100–199)
HDL: 46 mg/dL (ref >39)
Triglycerides: 426 mg/dL — ABNORMAL HIGH (ref 0–149)

## 2015-05-23 NOTE — Telephone Encounter (Signed)
She has degenerative changes in the lower back, in the discs and some of the joints.  Some of these changes could be affecting her and causing her symptoms.  Needs to see neurosurgery.  I put in the referral.  We were going to call her about this today anyway.  She doesn't need to come in, unless she wants to talk about it in specific.   I'm glad to see her, but the main issue is getting her set up with neurosurgery.  They may be able to help her (potentially w/o surgery).  Thanks.

## 2015-05-23 NOTE — Telephone Encounter (Signed)
Pt called wanting to see Dr. Damita Dunnings today to go over results on her MRI she had the day before. She has the disk and was told that the radiology office would be sending over a note to Dr. Damita Dunnings. There are no available appointments today for Dr. Damita Dunnings, so we got her on the soonest day which is Tuesday 05/28/15. She asked for me to send a message to Dr. Wynelle Beckmann and his medical assistant to see if there she needs to come in sooner. The best number to reach her is [phone].

## 2015-05-23 NOTE — Telephone Encounter (Signed)
Patient notified

## 2015-05-28 ENCOUNTER — Ambulatory Visit: Payer: Commercial Managed Care - HMO | Admitting: Family Medicine

## 2015-05-31 ENCOUNTER — Other Ambulatory Visit: Payer: Self-pay

## 2015-05-31 MED ORDER — ALIROCUMAB 75 MG/ML ~~LOC~~ SOPN: 1.0000 "pen " | PEN_INJECTOR | SUBCUTANEOUS | Status: DC

## 2015-06-03 ENCOUNTER — Encounter: Payer: Self-pay | Admitting: Family Medicine

## 2015-06-07 ENCOUNTER — Ambulatory Visit (INDEPENDENT_AMBULATORY_CARE_PROVIDER_SITE_OTHER): Payer: Commercial Managed Care - HMO

## 2015-06-07 DIAGNOSIS — Z23 Encounter for immunization: Secondary | ICD-10-CM

## 2015-06-11 ENCOUNTER — Telehealth: Payer: Self-pay | Admitting: *Deleted

## 2015-06-11 MED ORDER — ALIROCUMAB 75 MG/ML ~~LOC~~ SOPN: 1.0000 "pen " | PEN_INJECTOR | SUBCUTANEOUS | Status: DC

## 2015-06-11 NOTE — Telephone Encounter (Signed)
I've sent her rx to a specialty pharmacy that helps w/ getting ins approval.  Advised pt of this and that I'm happy to provide her w/ samples in the meantime.  She reports that her niece is in nursing school and she will have her call to set up a time to come in for training so that she may administer shots to pt.

## 2015-06-11 NOTE — Telephone Encounter (Signed)
Pt c/o medication issue:  1. Name of Medication: shot for Praluent.  2. How are you currently taking this medication (dosage and times per day)?   3. Are you having a reaction (difficulty breathing--STAT)?   4. What is your medication issue?  Insurance is they won't pay for it. And Dr Rockey Situ told her it could cost 600 a shot.  She is worried about not getting it then.

## 2015-06-13 ENCOUNTER — Telehealth: Payer: Self-pay | Admitting: *Deleted

## 2015-06-13 NOTE — Telephone Encounter (Signed)
Pt has been approved for Praluent from 06/12/15-06/11/17.

## 2015-06-20 ENCOUNTER — Telehealth: Payer: Self-pay

## 2015-06-20 NOTE — Telephone Encounter (Signed)
Pt states the cholesterol shot made her fell like she had the flu, states she was vomiting and diarrhea. Please call.

## 2015-06-20 NOTE — Telephone Encounter (Signed)
S/w pt who states she took praluent Saturday. Reports vomiting and diarrhea Sunday. States she has not been out of her house therefore, she is sure she has not picked up a stomach bug. Reports symptoms have improved but diarrhea is not totally resolved. Reports decreased appetite; drinking lots of Gatorade. States she will not take praluent again. Will take crestor and deal w/leg cramps Would like MD approval. Forward to Carl.

## 2015-06-22 NOTE — Telephone Encounter (Signed)
I believe she was on crestor 10 mg daily in the past

## 2015-06-24 ENCOUNTER — Telehealth: Payer: Self-pay | Admitting: *Deleted

## 2015-06-24 MED ORDER — ROSUVASTATIN CALCIUM 10 MG PO TABS: 10.0000 mg | ORAL_TABLET | Freq: Every day | ORAL | Status: DC

## 2015-06-24 NOTE — Addendum Note (Signed)
Addended by: Dede Query R on: 06/24/2015 09:17 AM   Modules accepted: Orders

## 2015-06-24 NOTE — Telephone Encounter (Signed)
Spoke w/ [facility].  Advised that pt requests to d/c Praluent, as she reports that it caused nausea & vomiting.

## 2015-06-24 NOTE — Telephone Encounter (Signed)
[place] with [facility] [phone]) called regarding rx Praluent 75mg  tabs. Patient complained to pharmacist that it makes her really sick. Please call Pharmacist and advise if they should cancel order.

## 2015-06-24 NOTE — Telephone Encounter (Signed)
Spoke w/ pt.  She reports that the pharmacy already gave her a refill, she would like more sent it.  She is going today to speak to neurosurgeon about her back.  She will call back w/ any further questions or concerns.

## 2015-06-29 ENCOUNTER — Other Ambulatory Visit: Payer: Self-pay | Admitting: Family Medicine

## 2015-06-29 NOTE — Telephone Encounter (Signed)
Sent. Thanks.   

## 2015-06-29 NOTE — Telephone Encounter (Signed)
To PCP

## 2015-08-14 ENCOUNTER — Other Ambulatory Visit: Payer: Self-pay | Admitting: *Deleted

## 2015-08-14 ENCOUNTER — Encounter: Payer: Self-pay | Admitting: Vascular Surgery

## 2015-08-14 DIAGNOSIS — I70213 Atherosclerosis of native arteries of extremities with intermittent claudication, bilateral legs: Secondary | ICD-10-CM

## 2015-08-20 ENCOUNTER — Ambulatory Visit (INDEPENDENT_AMBULATORY_CARE_PROVIDER_SITE_OTHER): Payer: Commercial Managed Care - HMO | Admitting: Family Medicine

## 2015-08-20 ENCOUNTER — Encounter: Payer: Self-pay | Admitting: Family Medicine

## 2015-08-20 ENCOUNTER — Ambulatory Visit (HOSPITAL_COMMUNITY)
Admission: RE | Admit: 2015-08-20 | Discharge: 2015-08-20 | Disposition: A | Discharge status: Home / Self Care | Payer: Commercial Managed Care - HMO | Source: Ambulatory Visit | Attending: Vascular Surgery | Admitting: Vascular Surgery

## 2015-08-20 ENCOUNTER — Ambulatory Visit (INDEPENDENT_AMBULATORY_CARE_PROVIDER_SITE_OTHER): Payer: Commercial Managed Care - HMO | Admitting: Vascular Surgery

## 2015-08-20 ENCOUNTER — Encounter: Payer: Self-pay | Admitting: Vascular Surgery

## 2015-08-20 VITALS — BP 162/52 | HR 75 | Temp 98.3°F | Wt 157.0 lb

## 2015-08-20 VITALS — BP 172/47 | HR 56 | Temp 98.0°F | Resp 14 | Ht 62.0 in | Wt 156.0 lb

## 2015-08-20 DIAGNOSIS — I10 Essential (primary) hypertension: Secondary | ICD-10-CM | POA: Insufficient documentation

## 2015-08-20 DIAGNOSIS — Z48812 Encounter for surgical aftercare following surgery on the circulatory system: Secondary | ICD-10-CM | POA: Insufficient documentation

## 2015-08-20 DIAGNOSIS — I6523 Occlusion and stenosis of bilateral carotid arteries: Secondary | ICD-10-CM | POA: Diagnosis not present

## 2015-08-20 DIAGNOSIS — I6502 Occlusion and stenosis of left vertebral artery: Secondary | ICD-10-CM | POA: Insufficient documentation

## 2015-08-20 DIAGNOSIS — E785 Hyperlipidemia, unspecified: Secondary | ICD-10-CM | POA: Insufficient documentation

## 2015-08-20 DIAGNOSIS — M4806 Spinal stenosis, lumbar region: Secondary | ICD-10-CM

## 2015-08-20 DIAGNOSIS — I708 Atherosclerosis of other arteries: Secondary | ICD-10-CM | POA: Insufficient documentation

## 2015-08-20 DIAGNOSIS — M48061 Spinal stenosis, lumbar region without neurogenic claudication: Secondary | ICD-10-CM

## 2015-08-20 DIAGNOSIS — I70213 Atherosclerosis of native arteries of extremities with intermittent claudication, bilateral legs: Secondary | ICD-10-CM

## 2015-08-20 MED ORDER — CARVEDILOL 3.125 MG PO TABS: ORAL_TABLET | ORAL | Status: DC

## 2015-08-20 MED ORDER — GABAPENTIN 100 MG PO CAPS: ORAL_CAPSULE | ORAL | Status: DC

## 2015-08-20 MED ORDER — ONDANSETRON HCL 4 MG PO TABS: 4.0000 mg | ORAL_TABLET | Freq: Three times a day (TID) | ORAL | Status: DC | PRN

## 2015-08-20 NOTE — Progress Notes (Signed)
Filed Vitals:   08/20/15 1010 08/20/15 1014 08/20/15 1020 08/20/15 1021  BP: 185/60 196/59 176/55 172/47  Pulse: 58 56 56 56  Temp:  98 F (36.7 C)    TempSrc:  Oral    Resp:  14    Height:  5\' 2"  (1.575 m)    Weight:  156 lb (70.761 kg)    SpO2:  100%

## 2015-08-20 NOTE — Progress Notes (Signed)
Pre visit review using our clinic review tool, if applicable. No additional management support is needed unless otherwise documented below in the visit note.  Pain from likely spinal stenosis.  She has no pain sitting but has pain with even a few steps.  From her back down to the legs B.  She has been injected B with some relief but she still has sig pain.  She was asking about gabapentin use.  D/w pt about options.   Meds, vitals, and allergies reviewed.   ROS: See HPI.  Otherwise, noncontributory.  nad ncat Mmm rrr ctab abd soft, not ttp Normal ROM at the hips when sitting down Back not ttp in the midline Sensation wnl BLE Pain in lower back and legs with standing and taking a few steps.

## 2015-08-20 NOTE — Progress Notes (Signed)
[facility] [place]  Patient name: Catherine Green MRN: [id number] DOB: March 04, 1941 Sex: female  REASON FOR VISIT:  Follow-up of diffuse peripheral vascular occlusive disease.  HPI: Catherine Green is a 74 y.o. female  Here today for follow-up of her carotid endarterectomies. She initially underwent a right carotid endarterectomy in 1992 and had a redo in 2006. She underwent left carotid endarterectomy for severe disease on November 2015.  He denies any neurologic deficits. She has been diagnosed with severe spinal stenosis. On last visit with her she was having leg pain that did not appear to be classic for lower from claudication and abnormal lower extremity studies.  Past Medical History  Diagnosis Date  . Hyperlipidemia   . Coronary artery disease   . Hypertension   . PVD (peripheral vascular disease) ([place])     endarterectomy by [name]  . Pre-syncope   . Thrombophlebitis     following childbirth  . Cerebrovascular disease     extracranial; occlusive  . Ulcer   . Dizziness   . Chicken pox   . Depression   . Fainting spell   . Allergy     hay fever  . Anemia   . DVT (deep venous thrombosis) ([place])   . Heart murmur   . PONV (postoperative nausea and vomiting)     severe nausea and vomiting  . Shortness of breath dyspnea     wth ambulation at times  . Dizziness   . Anxiety   . GERD (gastroesophageal reflux disease)   . Swelling of both ankles     and abdomen; takes Lasix when needed  . Seasonal allergies   . Headache   . Carotid artery occlusion     Family History  Problem Relation Age of Onset  . Aneurysm Mother     brain  . Heart disease Mother     Aneyursm   . Hyperlipidemia Mother   . Hypertension Mother   . Varicose Veins Mother   . Bleeding Disorder Mother   . Heart disease Father   . Cirrhosis Father   . Heart attack Father   . Hyperlipidemia Father   . Hypertension Father   . Heart disease Brother     Before age 35  .  Aneurysm Brother   . Deep vein thrombosis Brother   . Birth defects Brother   . Hyperlipidemia Brother   . Hypertension Brother   . Hypertension Other   . Cancer Sister     lung  . Heart disease Sister     Aneurysm  . Hyperlipidemia Sister   . Hypertension Sister   . Varicose Veins Sister   . Breast cancer Neg Hx   . Colon cancer Neg Hx   . Peripheral vascular disease Daughter   . Hyperlipidemia Daughter   . Hypertension Daughter   . Bleeding Disorder Sister     SOCIAL HISTORY: Social History  Substance Use Topics  . Smoking status: Former Smoker -- 0.30 packs/day for 15 years    Types: Cigarettes    Quit date: 08/31/1996  . Smokeless tobacco: Never Used  . Alcohol Use: No    Allergies  Allergen Reactions  . Atorvastatin Nausea And Vomiting  . Codeine Nausea And Vomiting  . Demerol Nausea And Vomiting  . Oxycodone Hcl Other (See Comments)    Hallucinations   . Protonix [Pantoprazole Sodium] Nausea Only  . Augmentin [Amoxicillin-Pot Clavulanate] Nausea And Vomiting  . Ivp Dye [Iodinated Diagnostic Agents]  Hives, itching  . Shellfish Allergy     Hives, itching  . Tramadol Other (See Comments)    Sedation   . Trazodone And Nefazodone Other (See Comments)    Sedation   . Xanax [Alprazolam]     Sedation, even at low dose    Current Outpatient Prescriptions  Medication Sig Dispense Refill  . aspirin 81 MG tablet Take 162 mg by mouth daily.     . carvedilol (COREG) 3.125 MG tablet Take 1/2 tab by mouth twice daily 90 tablet 3  . Coenzyme Q10 (CO Q 10) 100 MG CAPS Take by mouth.    . furosemide (LASIX) 20 MG tablet TAKE 1 TABLET (20 MG TOTAL) BY MOUTH DAILY 30 tablet 3  . hydrochlorothiazide (HYDRODIURIL) 50 MG tablet TAKE 1 TABLET (50 MG TOTAL) BY MOUTH DAILY. 90 tablet 3  . KLOR-CON M20 20 MEQ tablet TAKE 1 TABLET BY MOUTH EVERY DAY WITH LASIX 30 tablet 3  . potassium chloride (KLOR-CON) 20 MEQ packet Take 20 mEq by mouth daily.    . ranitidine (ZANTAC) 150  MG tablet Take 1 tablet (150 mg total) by mouth 2 (two) times daily as needed for heartburn (or abdominal pain).    . rosuvastatin (CRESTOR) 10 MG tablet Take 1 tablet (10 mg total) by mouth daily. 90 tablet 3  . sertraline (ZOLOFT) 25 MG tablet Take 1 tablet (25 mg total) by mouth daily. 30 tablet 5   No current facility-administered medications for this visit.    REVIEW OF SYSTEMS:  [X]  denotes positive finding, [ ]  denotes negative finding Cardiac  Comments:  Chest pain or chest pressure:    Shortness of breath upon exertion:    Short of breath when lying flat:    Irregular heart rhythm:        Vascular    Pain in calf, thigh, or hip brought on by ambulation: x   Pain in feet at night that wakes you up from your sleep:     Blood clot in your veins:    Leg swelling:         Pulmonary    Oxygen at home:    Productive cough:     Wheezing:         Neurologic    Sudden weakness in arms or legs:  x   Sudden numbness in arms or legs:     Sudden onset of difficulty speaking or slurred speech:    Temporary loss of vision in one eye:     Problems with dizziness:         Gastrointestinal    Blood in stool:     Vomited blood:         Genitourinary    Burning when urinating:     Blood in urine:        Psychiatric    Major depression:         Hematologic    Bleeding problems:    Problems with blood clotting too easily:        Skin    Rashes or ulcers:        Constitutional    Fever or chills:      PHYSICAL EXAM: Filed Vitals:   08/20/15 1010 08/20/15 1014 08/20/15 1020 08/20/15 1021  BP: 185/60 196/59 176/55 172/47  Pulse: 58 56 56 56  Temp:  98 F (36.7 C)    TempSrc:  Oral    Resp:  14    Height:  5\' 2"  (  1.575 m)    Weight:  156 lb (70.761 kg)    SpO2:  100%      GENERAL: The patient is a well-nourished female, in no acute distress. The vital signs are documented above. CARDIAC: There is a regular rate and rhythm.  VASCULAR:  Carotid incision is  well-healed bilaterally. She does have bilateral carotid bruits PULMONARY: There is good air exchange bilaterally without wheezing or rales. ABDOMEN: Soft and non-tender with normal pitched bowel sounds.  MUSCULOSKELETAL: There are no major deformities or cyanosis. NEUROLOGIC: No focal weakness or paresthesias are detected. SKIN: There are no ulcers or rashes noted. PSYCHIATRIC: The patient has a normal affect.  DATA:   carotid duplex today reveals widely patent endarterectomy site on the right. She does have moderate stenosis in the mid left internal carotid artery with systolic velocities at [id number] cm/s and end-diastolic elevated at 52 cm/s putting her in the 60-79% stenosis range  MEDICAL ISSUES:  stable overall. Discussed this finding with patient. I explained this is up ordered any appreciable increased risk for stroke. Recommend continued observation currently. We will see her again in 6 months for continued follow-up. Again reviewed symptoms of carotid disease with her and she'll notify us should this occur. If she has a significant deficit she will call 911 and present immediately to [facility] hospital  No Follow-up on file.   Curt Jews [facility] [place]: [phone]

## 2015-08-20 NOTE — Patient Instructions (Signed)
Start gabapentin.  Start with 1 tab at night. Gradually increase to 1 tab 3 times a day if needed.  Update me next week.  Take zofran as needed for nausea.  Take care.  Glad to see you.

## 2015-08-21 DIAGNOSIS — M48061 Spinal stenosis, lumbar region without neurogenic claudication: Secondary | ICD-10-CM | POA: Insufficient documentation

## 2015-08-21 NOTE — Addendum Note (Signed)
Addended by: Dorthula Rue L on: 08/21/2015 09:48 AM   Modules accepted: Orders

## 2015-08-21 NOTE — Assessment & Plan Note (Signed)
Reasonable to try gabapentin 100mg  qhs initially, slowly titrate up to 100mg  tid as tolerated.  She has had some nausea, likely from the pain.  Okay to use zofran prn.  She agrees with plan.  She'll update me.  Continue with f/u for possible injections and we may need to up her gabapentin dose more as tolerated.  Still okay for outpatient f/u.

## 2015-09-03 DIAGNOSIS — H26493 Other secondary cataract, bilateral: Secondary | ICD-10-CM | POA: Diagnosis not present

## 2015-09-04 ENCOUNTER — Telehealth: Payer: Self-pay

## 2015-09-04 MED ORDER — GABAPENTIN 300 MG PO CAPS: 300.0000 mg | ORAL_CAPSULE | Freq: Three times a day (TID) | ORAL | Status: DC | PRN

## 2015-09-04 NOTE — Telephone Encounter (Signed)
Patient notified as instructed by telephone and verbalized understanding. Patient stated that she is having so much problems with her legs that she is going to call and schedule a follow-up with the doctor that [facility] about getting injections. Patient stated that she would like a new script sent in for the 300 mg tablets so that she will not have to take so many pills a day. Pharmacy [facility].

## 2015-09-04 NOTE — Telephone Encounter (Signed)
Pt returned your call. Please call back. Thank you!

## 2015-09-04 NOTE — Telephone Encounter (Signed)
Keep going up on the medicine.  She is still on a really low dose.   Would gradually work up to 300mg  TID (9 pills in a day).  The effect may last longer with the higher dose.  We can send in new rx with the 300mg  tabs if needed, instead of the 100mg  tabs she has now. Thanks.

## 2015-09-04 NOTE — Telephone Encounter (Signed)
Pt was seen 08/20/15; pt was started to gabapentin 100 mg taking one daily and now pt taking gabapentin 100 mg 5 x a day; pt has relief after taking med for 30 mins to one hour only. Pt has back pain that goes down both legs; pain level is 9. Pt having problems walking as well due to pain and weakness. Pt wants to know what to try next.[place] request cb.

## 2015-09-04 NOTE — Telephone Encounter (Signed)
Sent.  Change to 300mg  tabs, take 1 twice a day.  After a few days, can increase up to 1 tab 3 times a day.   I agree with her calling about the injections.   Thanks.

## 2015-09-04 NOTE — Telephone Encounter (Signed)
Patient notified as instructed by telephone and verbalized understanding. 

## 2015-09-04 NOTE — Telephone Encounter (Signed)
Left message on answering machine at home number and cell to call back. 

## 2015-09-05 ENCOUNTER — Other Ambulatory Visit: Payer: Self-pay

## 2015-09-05 MED ORDER — CARVEDILOL 3.125 MG PO TABS: ORAL_TABLET | ORAL | Status: DC

## 2015-09-05 NOTE — Telephone Encounter (Signed)
90 day supply

## 2015-09-26 ENCOUNTER — Telehealth: Payer: Self-pay

## 2015-09-26 NOTE — Telephone Encounter (Signed)
Pt does not need refills now but wanted to verify that [facility] [address] is the local pharmacy on file. Verified; that is all that is needed now.

## 2015-10-01 ENCOUNTER — Encounter: Payer: Self-pay | Admitting: Family Medicine

## 2015-10-01 ENCOUNTER — Ambulatory Visit (INDEPENDENT_AMBULATORY_CARE_PROVIDER_SITE_OTHER): Payer: Medicare HMO | Admitting: Family Medicine

## 2015-10-01 VITALS — BP 132/62 | HR 68 | Temp 98.5°F | Wt 161.5 lb

## 2015-10-01 DIAGNOSIS — M4806 Spinal stenosis, lumbar region: Secondary | ICD-10-CM | POA: Diagnosis not present

## 2015-10-01 DIAGNOSIS — M48061 Spinal stenosis, lumbar region without neurogenic claudication: Secondary | ICD-10-CM

## 2015-10-01 MED ORDER — ONDANSETRON HCL 4 MG PO TABS: 4.0000 mg | ORAL_TABLET | Freq: Three times a day (TID) | ORAL | Status: DC | PRN

## 2015-10-01 MED ORDER — GABAPENTIN 300 MG PO CAPS: 300.0000 mg | ORAL_CAPSULE | Freq: Three times a day (TID) | ORAL | Status: DC | PRN

## 2015-10-01 NOTE — Progress Notes (Signed)
Pre visit review using our clinic review tool, if applicable. No additional management support is needed unless otherwise documented below in the visit note.  Back pain.  Lumbar stenosis.  Pain will radiate down the legs and "my legs go weak."  She has been injected x3 prev.  The injections helped some, more with the last one than the first two shots.  She isn't able to get another shot for a few months.  More pain standing.  She does better sitting.  She has to sleep in the recliner on bad nights.  She can't go to the grocery store w/o troubles.  She can't ride distance in a car comfortably. Gabapentin has helped some.  She isn't drowsy or having vertigo with the med  Meds, vitals, and allergies reviewed.   ROS: See HPI.  Otherwise, noncontributory.  nad while sitting in chair.  Pain on standing/walking.  rrr ctab abd soft Back w/o midline pain S/S wnl BLE- she doesn't have true weakness but I think the pain (when present) can make her wince and have the effect of relative weakness, but not truly flaccid muscles in her legs.

## 2015-10-01 NOTE — Patient Instructions (Signed)
Gabapentin dose change- 300mg  per tab.   Gradually work up to 6 tabs a day if tolerated and if needed for pain.  2+1+1 for a few days.   Then 2+2+1 for a few days.  Then 2+2+2 for a few days.  Update me in about a week, sooner if needed.   Take care.  Glad to see you.

## 2015-10-02 NOTE — Assessment & Plan Note (Signed)
Gradually up her gabapentin, up to 600mg  tid if needed and tolerated. She'll get her next injection when possible.  Still okay for outpatient f/u.

## 2015-10-11 ENCOUNTER — Telehealth: Payer: Self-pay | Admitting: Family Medicine

## 2015-10-11 DIAGNOSIS — M4800 Spinal stenosis, site unspecified: Secondary | ICD-10-CM

## 2015-10-11 NOTE — Telephone Encounter (Signed)
If not sedated, she can try to go up on the gabapentin slowly, gradually working up to 3 tabs TID.  Thanks.

## 2015-10-11 NOTE — Telephone Encounter (Signed)
Spoke with patient.  She is having one of her "worse days ever" with the back pain.  She is nauseous from the pain.  She has fallen 3 different times in the past two weeks due to the weakness in her legs.  Pt declined appointment today, says she cannot get here due to pain.  She would like to be referred to a different back specialist .  Pt states that the Injections Dr. Orpah Melter was giving her were not really helping.    Best number to call is [phone]

## 2015-10-11 NOTE — Telephone Encounter (Signed)
Patient's husband (DPR) notified as instructed by telephone and verbalized understanding. Was advised that patient is sleeping and he was told to take the message.

## 2015-10-11 NOTE — Telephone Encounter (Signed)
Referral done.  How much gabapentin is she taking? Let me know.  Thanks.

## 2015-10-11 NOTE — Telephone Encounter (Signed)
Advised patient by telephone that referral has been done; Patient stated that she is taking 2 Gabapentin by mouth 3 times a day.

## 2015-10-21 ENCOUNTER — Telehealth: Payer: Self-pay | Admitting: Family Medicine

## 2015-10-21 ENCOUNTER — Ambulatory Visit: Payer: Medicare HMO | Admitting: Family Medicine

## 2015-10-21 MED ORDER — GABAPENTIN 300 MG PO CAPS: 300.0000 mg | ORAL_CAPSULE | Freq: Three times a day (TID) | ORAL | Status: DC | PRN

## 2015-10-21 NOTE — Telephone Encounter (Signed)
Pt called pharmacy to get her Gabapentin.  The pharmacy said they will not refill her medicine and she needs a new rx for this.  Pt would like a call back.

## 2015-10-21 NOTE — Telephone Encounter (Signed)
Pt was to cb with update;pt is taking gabapentin 300 mg taking 3 tabs tid and back pain is much better; pt has appt to see Dr Trenton Gammon on 10/30/15. Pt wanted to verify that pt should continue the Gabapentin until see Dr Trenton Gammon. The shots Dr Brien Few gave pt did not help at all. Pt request cb.

## 2015-10-21 NOTE — Telephone Encounter (Signed)
Would continue TID dosing as is.  Thanks.  Let me know if she needs a refill.

## 2015-10-21 NOTE — Telephone Encounter (Signed)
Sent. Thanks.   

## 2015-10-21 NOTE — Telephone Encounter (Signed)
Patient advised. Patient states she needs a refill of Gabapentin.

## 2015-10-22 ENCOUNTER — Encounter: Payer: Self-pay | Admitting: Family Medicine

## 2015-10-30 DIAGNOSIS — M4806 Spinal stenosis, lumbar region: Secondary | ICD-10-CM | POA: Diagnosis not present

## 2015-10-31 ENCOUNTER — Telehealth: Payer: Self-pay

## 2015-10-31 NOTE — Telephone Encounter (Signed)
Catherine Green left v/m (DPR signed) pt received handicap placard last fall and placard has expired; pt still receiving shots in her back and pt is still on a walker; should pt contact office doing shots in her back for handicap placard or will Dr Damita Dunnings fill out form for handicap placard.Please advise.

## 2015-10-31 NOTE — Telephone Encounter (Signed)
Form signed.  She'll need to fill out the rest.   I signed it as permanent, since I don't know how long it will take her to get better.   Hope she feels better soon.  Thanks.

## 2015-10-31 NOTE — Telephone Encounter (Signed)
Husband advised.  Form left at front desk for pickup.

## 2015-11-12 DIAGNOSIS — M47816 Spondylosis without myelopathy or radiculopathy, lumbar region: Secondary | ICD-10-CM | POA: Diagnosis not present

## 2015-11-27 ENCOUNTER — Other Ambulatory Visit: Payer: Self-pay | Admitting: Family Medicine

## 2015-11-28 ENCOUNTER — Encounter: Payer: Self-pay | Admitting: Family Medicine

## 2015-11-28 ENCOUNTER — Ambulatory Visit (INDEPENDENT_AMBULATORY_CARE_PROVIDER_SITE_OTHER): Payer: Medicare HMO | Admitting: Family Medicine

## 2015-11-28 ENCOUNTER — Telehealth: Payer: Self-pay | Admitting: Family Medicine

## 2015-11-28 VITALS — BP 126/80 | HR 73 | Temp 98.4°F | Wt 169.0 lb

## 2015-11-28 DIAGNOSIS — M4806 Spinal stenosis, lumbar region: Secondary | ICD-10-CM

## 2015-11-28 DIAGNOSIS — M48061 Spinal stenosis, lumbar region without neurogenic claudication: Secondary | ICD-10-CM

## 2015-11-28 MED ORDER — ALPRAZOLAM 0.25 MG PO TABS: 0.2500 mg | ORAL_TABLET | Freq: Every day | ORAL | Status: DC | PRN

## 2015-11-28 MED ORDER — GABAPENTIN 300 MG PO CAPS: 300.0000 mg | ORAL_CAPSULE | Freq: Every evening | ORAL | Status: DC | PRN

## 2015-11-28 NOTE — Progress Notes (Signed)
Pre visit review using our clinic review tool, if applicable. No additional management support is needed unless otherwise documented below in the visit note.  She couldn't tolerate zoloft, has started back on xanax in the meantime with some relief.  No ADE on med.    Back pain.  She was taking up to 900mg  of gabapentin tid for pain, but w/o relief from pain.  She weaned off in the meantime except for taking it at night, unclear if it helps some at night.    She has seen Dr. Annette Stable in the meantime.  Prev MRI done in 05/2015.  She has had mult injections in the meantime.    The question is if she would benefit from another MRI, ie if that would change her course of treatment.    She admits to being frustrated about the pain and lifestyle limitations.  Pain is worse than in the fall.  No pain sitting but worse as soon as she stands.    Meds, vitals, and allergies reviewed.   ROS: See HPI.  Otherwise, noncontributory.  nad ncat rrr ctab abd soft Sensation intact in the legs Back not ttp, no pain sitting but pain as soon as she stands.

## 2015-11-28 NOTE — Patient Instructions (Signed)
Let me talk to Dr. Annette Stable in the meantime and we'll try to make plans.  I'll see if another MRI would be useful.  Take care.  Glad to see you.

## 2015-11-28 NOTE — Telephone Encounter (Signed)
Medication phoned to pharmacy.  

## 2015-11-28 NOTE — Telephone Encounter (Signed)
Please call in the xanax.  Thanks.  

## 2015-11-29 ENCOUNTER — Telehealth: Payer: Self-pay | Admitting: Family Medicine

## 2015-11-29 NOTE — Telephone Encounter (Signed)
Dr. Annette Stable will not be back in the office until Tuesday.  Dr. Marchelle Folks CMA says that Ms. Kivett does not have a FU appt scheduled.  She was referred to Dr. Brien Few for pain management and injections.  Dr. Marchelle Folks CMA says she will ask him on Tuesday and get back with [place] with the answer.

## 2015-11-29 NOTE — Assessment & Plan Note (Addendum)
Her back pain is worse overall, in spite of injections.  I'll check with Dr. Annette Stable to see if anything other than repeat MRI would be useful and we'll go from there.  Patient agrees.

## 2015-11-29 NOTE — Telephone Encounter (Signed)
Noted. Thanks.

## 2015-11-29 NOTE — Telephone Encounter (Signed)
Will you please call Dr. Marchelle Folks office to pass this message along? Her back pain is worse.  Is there anything other than repeat L spine MRI would be useful to him? I can order the MRI.   Thanks.

## 2015-12-04 ENCOUNTER — Telehealth: Payer: Self-pay | Admitting: Family Medicine

## 2015-12-04 DIAGNOSIS — M48061 Spinal stenosis, lumbar region without neurogenic claudication: Secondary | ICD-10-CM

## 2015-12-04 NOTE — Telephone Encounter (Signed)
I talked to Dr. Annette Stable, reasonable to repeat MRI L spine.  I put in the order.  Thanks.

## 2015-12-05 NOTE — Telephone Encounter (Signed)
Husband advised. 

## 2015-12-06 DIAGNOSIS — M47816 Spondylosis without myelopathy or radiculopathy, lumbar region: Secondary | ICD-10-CM | POA: Diagnosis not present

## 2015-12-06 DIAGNOSIS — M4806 Spinal stenosis, lumbar region: Secondary | ICD-10-CM | POA: Diagnosis not present

## 2015-12-16 ENCOUNTER — Telehealth: Payer: Self-pay | Admitting: Family Medicine

## 2015-12-16 NOTE — Telephone Encounter (Signed)
Patient advised.  Copy faxed to Dr. Annette Stable with Neurosurgery.

## 2015-12-16 NOTE — Telephone Encounter (Signed)
Spouse called to check on mri results Please advise

## 2015-12-20 DIAGNOSIS — I10 Essential (primary) hypertension: Secondary | ICD-10-CM | POA: Diagnosis not present

## 2015-12-20 DIAGNOSIS — Z6831 Body mass index (BMI) 31.0-31.9, adult: Secondary | ICD-10-CM | POA: Diagnosis not present

## 2015-12-20 DIAGNOSIS — M47816 Spondylosis without myelopathy or radiculopathy, lumbar region: Secondary | ICD-10-CM | POA: Diagnosis not present

## 2015-12-23 ENCOUNTER — Encounter: Payer: Self-pay | Admitting: Family Medicine

## 2015-12-24 ENCOUNTER — Other Ambulatory Visit: Payer: Self-pay | Admitting: Neurosurgery

## 2015-12-24 DIAGNOSIS — G8929 Other chronic pain: Secondary | ICD-10-CM

## 2015-12-24 DIAGNOSIS — M545 Low back pain, unspecified: Secondary | ICD-10-CM

## 2015-12-25 ENCOUNTER — Ambulatory Visit
Admission: RE | Admit: 2015-12-25 | Discharge: 2015-12-25 | Disposition: A | Discharge status: Home / Self Care | Payer: Medicare HMO | Source: Ambulatory Visit | Attending: Neurosurgery | Admitting: Neurosurgery

## 2015-12-25 ENCOUNTER — Other Ambulatory Visit: Payer: Self-pay | Admitting: Family Medicine

## 2015-12-25 DIAGNOSIS — G8929 Other chronic pain: Secondary | ICD-10-CM

## 2015-12-25 DIAGNOSIS — M545 Low back pain, unspecified: Secondary | ICD-10-CM

## 2015-12-25 DIAGNOSIS — M5126 Other intervertebral disc displacement, lumbar region: Secondary | ICD-10-CM | POA: Diagnosis not present

## 2015-12-25 MED ORDER — IOPAMIDOL (ISOVUE-M 200) INJECTION 41%
15.0000 mL | Freq: Once | INTRAMUSCULAR | Status: AC
  Administered 2015-12-25: 15 mL via INTRATHECAL

## 2015-12-25 NOTE — Progress Notes (Addendum)
Pt states she took 2 benadryl this A.M.to prevent itching with contrast.  No valium given, pt sleepy from the benadryl

## 2015-12-25 NOTE — Telephone Encounter (Signed)
Received refill electronically Medication is no longer on medication list. Is it okay to refill?

## 2015-12-25 NOTE — Discharge Instructions (Signed)

## 2015-12-25 NOTE — Telephone Encounter (Signed)
Patient's husband (DPR) notified as instructed by telephone and verbalized understanding. Mr. Boze stated that the pharmacy called about the refill and he told them to go ahead and refill it because he was not aware that it was stopped. Mr. Abdulrahman stated that he or his wife will contact the pharmacy and let them know that she is no longer on this medication.

## 2015-12-25 NOTE — Telephone Encounter (Signed)
Agreed, this had been stopped.  Thanks.  I denied it.  Notify pt as FYI.  I think this was an auto refill request.

## 2015-12-30 ENCOUNTER — Encounter: Payer: Self-pay | Admitting: Cardiovascular Disease

## 2015-12-30 ENCOUNTER — Ambulatory Visit (INDEPENDENT_AMBULATORY_CARE_PROVIDER_SITE_OTHER): Payer: Medicare HMO | Admitting: Cardiovascular Disease

## 2015-12-30 VITALS — BP 140/64 | HR 65 | Ht 62.0 in | Wt 172.0 lb

## 2015-12-30 DIAGNOSIS — E785 Hyperlipidemia, unspecified: Secondary | ICD-10-CM

## 2015-12-30 DIAGNOSIS — M4806 Spinal stenosis, lumbar region: Secondary | ICD-10-CM

## 2015-12-30 DIAGNOSIS — I25810 Atherosclerosis of coronary artery bypass graft(s) without angina pectoris: Secondary | ICD-10-CM | POA: Diagnosis not present

## 2015-12-30 DIAGNOSIS — I10 Essential (primary) hypertension: Secondary | ICD-10-CM

## 2015-12-30 DIAGNOSIS — I6523 Occlusion and stenosis of bilateral carotid arteries: Secondary | ICD-10-CM | POA: Diagnosis not present

## 2015-12-30 DIAGNOSIS — R030 Elevated blood-pressure reading, without diagnosis of hypertension: Secondary | ICD-10-CM

## 2015-12-30 DIAGNOSIS — M48061 Spinal stenosis, lumbar region without neurogenic claudication: Secondary | ICD-10-CM

## 2015-12-30 DIAGNOSIS — R0989 Other specified symptoms and signs involving the circulatory and respiratory systems: Secondary | ICD-10-CM

## 2015-12-30 DIAGNOSIS — I5033 Acute on chronic diastolic (congestive) heart failure: Secondary | ICD-10-CM

## 2015-12-30 HISTORY — PX: SPINE SURGERY: SHX786

## 2015-12-30 MED ORDER — ROSUVASTATIN CALCIUM 20 MG PO TABS: 20.0000 mg | ORAL_TABLET | Freq: Every day | ORAL | Status: DC

## 2015-12-30 MED ORDER — EZETIMIBE 10 MG PO TABS: 10.0000 mg | ORAL_TABLET | Freq: Every day | ORAL | Status: DC

## 2015-12-30 NOTE — Assessment & Plan Note (Addendum)
60-79% stenosis on recent ultrasound, bruit on exam.  We have pulled up the ultrasound for her review today. She is Followed in [place] We have stressed the importance of aggressive cholesterol control

## 2015-12-30 NOTE — Assessment & Plan Note (Signed)
Currently with no symptoms of angina. No further workup at this time. Continue current medication regimen. 

## 2015-12-30 NOTE — Assessment & Plan Note (Signed)
Appears relatively euvolemic on today's visit. No changes to her medications. She is on HCTZ and Lasix

## 2015-12-30 NOTE — Progress Notes (Signed)
Patient ID: Catherine Green, female    DOB: 10-01-1940, 75 y.o.   MRN: [id number]  HPI Comments: Catherine Green is a very pleasant 75 year-old woman with history coronary artery disease, bypass surgery, peripheral vascular disease with history of right carotid endarterectomy, as well as  left CEA followed by [name], who presents for routine followup of her coronary artery disease. Last catheterization in January 2009 showing patent vein grafts, atretic LIMA to the LAD.     problems with anxiety in the past  In follow-up today, she reports that she is tolerating Crestor 10 mg daily (previously stopped the Crestor as she reported a previously made her legs weak). After stopping the medication, cholesterol went up to 260 She is interested in increasing the dose up to 20 mg daily No recent lipid panel available on the medication  She is concerned about recent weight gain, had cortisone shots which she attributes to the weight gain. On last clinic visit was 155 pounds, now 170 pounds. She has been less active secondary to her back pain, arthritis  Recent MRI of her back reviewed with her showing DJD, foraminal narrowing She has close follow-up with neurosurgery, [name].  Currently has severe symptoms, unable to stand for very long, relatively inactive secondary to chronic pain She is interested in surgery if it makes her feel better  Reports blood pressure has been relatively stable  EKG on today's visit shows normal sinus rhythm with rate 65 bpm, nonspecific ST abnormality  Other past medical history Previously went on a cruise and developed pneumonia, required ABX Reports having labile blood pressure.   Previous symptoms of shortness of breath and weight gain. Improved with Lasix to take as needed.  She had her gallbladder taken out 03/13/2014.  she had "hair loss" on simvastatin, as well as sores on her head. Lipitor caused leg problems/myalgias.  She is not particularly interested  in fenofibrate, WelChol. Too many pills.  She has had problems with zetia.   Previously stopped her "nerve" pill. Significant stressors as husband is sick. She was previously using the treadmill on a regular basis       Allergies  Allergen Reactions  . Lipitor [Atorvastatin] Nausea And Vomiting  . Oxycodone Hcl Other (See Comments)    Hallucinations   . Protonix [Pantoprazole Sodium] Nausea Only  . Tramadol Other (See Comments)    Sedation   . Trazodone And Nefazodone Other (See Comments)    Sedation   . Zoloft [Sertraline] Other (See Comments)    Excessive sweating  . Augmentin [Amoxicillin-Pot Clavulanate] Nausea And Vomiting  . Codeine Nausea And Vomiting  . Demerol Nausea And Vomiting  . Ivp Dye [Iodinated Diagnostic Agents] Hives and Itching       . Shellfish Allergy Hives and Itching    Outpatient Encounter Prescriptions as of 12/30/2015  Medication Sig  . ALPRAZolam (XANAX) 0.25 MG tablet Take 1 tablet (0.25 mg total) by mouth daily as needed for anxiety.  Marland Kitchen aspirin 81 MG tablet Take 162 mg by mouth daily.   . carvedilol (COREG) 3.125 MG tablet Take 1 tab by mouth twice daily  . Coenzyme Q10 (CO Q 10) 100 MG CAPS Take by mouth.  . furosemide (LASIX) 20 MG tablet TAKE 1 TABLET (20 MG TOTAL) BY MOUTH DAILY  . gabapentin (NEURONTIN) 300 MG capsule Take 1-2 capsules (300-600 mg total) by mouth at bedtime as needed.  . hydrochlorothiazide (HYDRODIURIL) 50 MG tablet TAKE 1 TABLET (50 MG TOTAL)  BY MOUTH DAILY.  Marland Kitchen KLOR-CON M20 20 MEQ tablet TAKE 1 TABLET BY MOUTH EVERY DAY WITH LASIX  . ondansetron (ZOFRAN) 4 MG tablet Take 1 tablet (4 mg total) by mouth every 8 (eight) hours as needed for nausea or vomiting.  . ranitidine (ZANTAC) 150 MG tablet Take 1 tablet (150 mg total) by mouth 2 (two) times daily as needed for heartburn (or abdominal pain).  . rosuvastatin (CRESTOR) 10 MG tablet Take 1 tablet (10 mg total) by mouth daily.   No facility-administered encounter medications  on file as of 12/30/2015.    Past Medical History  Diagnosis Date  . Hyperlipidemia   . Coronary artery disease   . Hypertension   . PVD (peripheral vascular disease) ([place])     endarterectomy by [name]  . Pre-syncope   . Thrombophlebitis     following childbirth  . Cerebrovascular disease     extracranial; occlusive  . Ulcer   . Dizziness   . Chicken pox   . Depression   . Fainting spell   . Allergy     hay fever  . Anemia   . DVT (deep venous thrombosis) ([place])   . Heart murmur   . PONV (postoperative nausea and vomiting)     severe nausea and vomiting  . Shortness of breath dyspnea     wth ambulation at times  . Dizziness   . Anxiety   . GERD (gastroesophageal reflux disease)   . Swelling of both ankles     and abdomen; takes Lasix when needed  . Seasonal allergies   . Headache   . Carotid artery occlusion     Past Surgical History  Procedure Laterality Date  . Septoplasty      with bilateral inferior turbinate reductions  . Appendectomy    . Breast surgery      saline implants  . Cataract extraction, bilateral  2013  . Upper gi endoscopy    . Cholecystectomy, laparoscopic  04/02/14    Dr. Rochel Brome  . Coronary artery bypass graft  2006    x3 Dr. Prescott Gum  . Total abdominal hysterectomy    . Carotid endarterectomy  1992  . Carotid endarterectomy Right February 13, 2005    Re-do Right CE  . Eye surgery Bilateral Feb. 2013    Cataract Left eye  . Cardiac catheterization    . Upper gastrointestinal endoscopy      had polyps removed from esophagus  . Endarterectomy Left 07/20/2014    Procedure: ENDARTERECTOMY CAROTID-LEFT;  Surgeon: Rosetta Posner, MD;  Location: [facility];  Service: Vascular;  Laterality: Left;    Social History  reports that she quit smoking about 19 years ago. Her smoking use included Cigarettes. She has a 4.5 pack-year smoking history. She has never used smokeless tobacco. She reports that she does not drink alcohol or use illicit  drugs.  Family History family history includes Aneurysm in her brother and mother; Birth defects in her brother; Bleeding Disorder in her mother and sister; Cancer in her sister; Cirrhosis in her father; Deep vein thrombosis in her brother; Heart attack in her father; Heart disease in her brother, father, mother, and sister; Hyperlipidemia in her brother, daughter, father, mother, and sister; Hypertension in her brother, daughter, father, mother, other, and sister; Peripheral vascular disease in her daughter; Varicose Veins in her mother and sister. There is no history of Breast cancer or Colon cancer.   Review of Systems  Constitutional: Negative.  Respiratory: Negative.   Cardiovascular: Negative.   Gastrointestinal: Negative.   Musculoskeletal: Positive for back pain and arthralgias.  Neurological: Negative.   Hematological: Negative.   Psychiatric/Behavioral: Negative.   All other systems reviewed and are negative.   BP 140/64 mmHg  Ht 5\' 2"  (1.575 m)  Wt 172 lb (78.019 kg)  BMI 31.45 kg/m2  Physical Exam  Constitutional: She is oriented to person, place, and time. She appears well-developed and well-nourished.  HENT:  Head: Normocephalic.  Nose: Nose normal.  Mouth/Throat: Oropharynx is clear and moist.  Eyes: Conjunctivae are normal. Pupils are equal, round, and reactive to light.  Neck: Normal range of motion. Neck supple. No JVD present. Carotid bruit is present.  Cardiovascular: Normal rate, regular rhythm, S1 normal, S2 normal and intact distal pulses.  Exam reveals no gallop and no friction rub.   Murmur heard.  Systolic murmur is present with a grade of 2/6  Pulmonary/Chest: Effort normal and breath sounds normal. No respiratory distress. She has no wheezes. She has no rales. She exhibits no tenderness.  Abdominal: Soft. Bowel sounds are normal. She exhibits no distension. There is no tenderness.  Musculoskeletal: Normal range of motion. She exhibits no edema or  tenderness.  Lymphadenopathy:    She has no cervical adenopathy.  Neurological: She is alert and oriented to person, place, and time. Coordination normal.  Skin: Skin is warm and dry. No rash noted. No erythema.  Psychiatric: She has a normal mood and affect. Her behavior is normal. Judgment and thought content normal.    Assessment and Plan  Nursing note and vitals reviewed.

## 2015-12-30 NOTE — Assessment & Plan Note (Signed)
Long discussion concerning her cholesterol. Recommend she increase Crestor up to 20 mrem daily, also would start zetia 10 mg daily Currently is unable to afford praluent or repatha Goal LDL less than 70

## 2015-12-30 NOTE — Assessment & Plan Note (Signed)
Severe back pain, recent documentation of foraminal narrowing on MRI She has follow-up with neurosurgery, limiting her lifestyle currently

## 2015-12-30 NOTE — Patient Instructions (Signed)
You are doing well.  Please increase the crestor up to 20 mg daily After 2 to 3 weeks Start zetia one a day  If crestor is expensive, Take coupon and script to [facility] in [place], do not run through insurance   Please call us if you have new issues that need to be addressed before your next appt.  Your physician wants you to follow-up in: 6 months.  You will receive a reminder letter in the mail two months in advance. If you don't receive a letter, please call our office to schedule the follow-up appointment.

## 2015-12-30 NOTE — Assessment & Plan Note (Signed)
Reports blood pressure has been less labile. No changes made to her medications   Total encounter time more than 25 minutes  Greater than 50% was spent in counseling and coordination of care with the patient

## 2016-01-02 DIAGNOSIS — M47816 Spondylosis without myelopathy or radiculopathy, lumbar region: Secondary | ICD-10-CM | POA: Diagnosis not present

## 2016-01-07 DIAGNOSIS — Z01812 Encounter for preprocedural laboratory examination: Secondary | ICD-10-CM | POA: Diagnosis not present

## 2016-01-14 DIAGNOSIS — M5126 Other intervertebral disc displacement, lumbar region: Secondary | ICD-10-CM | POA: Diagnosis not present

## 2016-01-14 DIAGNOSIS — M4806 Spinal stenosis, lumbar region: Secondary | ICD-10-CM | POA: Diagnosis not present

## 2016-01-14 HISTORY — PX: BACK SURGERY: SHX140

## 2016-02-08 ENCOUNTER — Other Ambulatory Visit: Payer: Self-pay | Admitting: Family Medicine

## 2016-02-10 ENCOUNTER — Encounter: Payer: Self-pay | Admitting: Vascular Surgery

## 2016-02-10 NOTE — Telephone Encounter (Signed)
Electronic refill request.  Patient requests 90 day supply.  Last Filled:   11/28/15, ? Quantity.  Please advise.   Last office visit:   11/28/15.  Please advise.

## 2016-02-10 NOTE — Telephone Encounter (Signed)
Sent. Thanks.   

## 2016-02-18 ENCOUNTER — Ambulatory Visit (INDEPENDENT_AMBULATORY_CARE_PROVIDER_SITE_OTHER): Payer: Medicare HMO | Admitting: Vascular Surgery

## 2016-02-18 ENCOUNTER — Encounter: Payer: Self-pay | Admitting: Vascular Surgery

## 2016-02-18 ENCOUNTER — Ambulatory Visit (HOSPITAL_COMMUNITY)
Admission: RE | Admit: 2016-02-18 | Discharge: 2016-02-18 | Disposition: A | Discharge status: Home / Self Care | Payer: Medicare HMO | Source: Ambulatory Visit | Attending: Vascular Surgery | Admitting: Vascular Surgery

## 2016-02-18 VITALS — BP 154/62 | HR 78 | Temp 97.7°F | Resp 20 | Ht 63.0 in | Wt 165.3 lb

## 2016-02-18 DIAGNOSIS — K219 Gastro-esophageal reflux disease without esophagitis: Secondary | ICD-10-CM | POA: Diagnosis not present

## 2016-02-18 DIAGNOSIS — I6523 Occlusion and stenosis of bilateral carotid arteries: Secondary | ICD-10-CM

## 2016-02-18 DIAGNOSIS — F329 Major depressive disorder, single episode, unspecified: Secondary | ICD-10-CM | POA: Insufficient documentation

## 2016-02-18 DIAGNOSIS — I10 Essential (primary) hypertension: Secondary | ICD-10-CM | POA: Diagnosis not present

## 2016-02-18 DIAGNOSIS — R69 Illness, unspecified: Secondary | ICD-10-CM | POA: Diagnosis not present

## 2016-02-18 DIAGNOSIS — F419 Anxiety disorder, unspecified: Secondary | ICD-10-CM | POA: Diagnosis not present

## 2016-02-18 DIAGNOSIS — I2510 Atherosclerotic heart disease of native coronary artery without angina pectoris: Secondary | ICD-10-CM | POA: Insufficient documentation

## 2016-02-18 DIAGNOSIS — E785 Hyperlipidemia, unspecified: Secondary | ICD-10-CM | POA: Insufficient documentation

## 2016-02-18 DIAGNOSIS — Z48812 Encounter for surgical aftercare following surgery on the circulatory system: Secondary | ICD-10-CM

## 2016-02-18 NOTE — Progress Notes (Signed)
Filed Vitals:   02/18/16 1238 02/18/16 1246 02/18/16 1253  BP: 154/71 167/67 154/62  Pulse: 78    Temp: 97.7 F (36.5 C)    TempSrc: Oral    Resp: 20    Height: 5\' 3"  (1.6 m)    Weight: 165 lb 4.8 oz (74.98 kg)

## 2016-02-18 NOTE — Progress Notes (Signed)
[facility] [place]  Patient name: Catherine Green MRN: [id number] DOB: [birth year] Sex: female  REASON FOR VISIT: Follow-up carotid disease   HPI: Catherine Green is a 75 y.o. female here today for follow-up of asymptomatic carotid disease. She is well-known to me from prior right endarterectomy in 1992 and a redo in 2006. She also underwent left endarterectomy in 2015. She is upset today. Apparently her brother is doing poorly and has been placed in comfort care at [facility]. She has undergone recent back surgery for degenerative disc disease and was recovering from this. She has had no carotid symptoms.  Past Medical History  Diagnosis Date  . Hyperlipidemia   . Coronary artery disease   . Hypertension   . PVD (peripheral vascular disease) ([place])     endarterectomy by [name]  . Pre-syncope   . Thrombophlebitis     following childbirth  . Cerebrovascular disease     extracranial; occlusive  . Ulcer   . Dizziness   . Chicken pox   . Depression   . Fainting spell   . Allergy     hay fever  . Anemia   . DVT (deep venous thrombosis) ([place])   . Heart murmur   . PONV (postoperative nausea and vomiting)     severe nausea and vomiting  . Shortness of breath dyspnea     wth ambulation at times  . Dizziness   . Anxiety   . GERD (gastroesophageal reflux disease)   . Swelling of both ankles     and abdomen; takes Lasix when needed  . Seasonal allergies   . Headache   . Carotid artery occlusion     Family History  Problem Relation Age of Onset  . Aneurysm Mother     brain  . Heart disease Mother     Aneyursm   . Hyperlipidemia Mother   . Hypertension Mother   . Varicose Veins Mother   . Bleeding Disorder Mother   . Heart disease Father   . Cirrhosis Father   . Heart attack Father   . Hyperlipidemia Father   . Hypertension Father   . Heart disease Brother     Before age 20  . Aneurysm Brother   . Deep  vein thrombosis Brother   . Birth defects Brother   . Hyperlipidemia Brother   . Hypertension Brother   . Hypertension Other   . Cancer Sister     lung  . Heart disease Sister     Aneurysm  . Hyperlipidemia Sister   . Hypertension Sister   . Varicose Veins Sister   . Breast cancer Neg Hx   . Colon cancer Neg Hx   . Peripheral vascular disease Daughter   . Hyperlipidemia Daughter   . Hypertension Daughter   . Bleeding Disorder Sister     SOCIAL HISTORY: Social History  Substance Use Topics  . Smoking status: Former Smoker -- 0.30 packs/day for 15 years    Types: Cigarettes    Quit date: 08/31/1996  . Smokeless tobacco: Never Used  . Alcohol Use: No    Allergies  Allergen Reactions  . Lipitor [Atorvastatin] Nausea And Vomiting  . Oxycodone Hcl Other (See Comments)    Hallucinations   . Protonix [Pantoprazole Sodium] Nausea Only  . Tramadol Other (See Comments)    Sedation   . Trazodone And Nefazodone Other (See Comments)    Sedation   . Zoloft [Sertraline] Other (See Comments)  Excessive sweating  . Augmentin [Amoxicillin-Pot Clavulanate] Nausea And Vomiting  . Codeine Nausea And Vomiting  . Demerol Nausea And Vomiting  . Ivp Dye [Iodinated Diagnostic Agents] Hives and Itching       . Shellfish Allergy Hives and Itching    Current Outpatient Prescriptions  Medication Sig Dispense Refill  . ALPRAZolam (XANAX) 0.25 MG tablet Take 1 tablet (0.25 mg total) by mouth daily as needed for anxiety. 30 tablet 2  . aspirin 81 MG tablet Take 162 mg by mouth daily.     . carvedilol (COREG) 3.125 MG tablet Take 1 tab by mouth twice daily 180 tablet 3  . Coenzyme Q10 (CO Q 10) 100 MG CAPS Take by mouth.    . ezetimibe (ZETIA) 10 MG tablet Take 1 tablet (10 mg total) by mouth daily. 30 tablet 11  . furosemide (LASIX) 20 MG tablet TAKE 1 TABLET (20 MG TOTAL) BY MOUTH DAILY (Patient taking differently: Take 20 mg by mouth daily as needed. TAKE 1 TABLET (20 MG TOTAL) BY MOUTH  DAILY) 30 tablet 3  . hydrochlorothiazide (HYDRODIURIL) 50 MG tablet TAKE 1 TABLET (50 MG TOTAL) BY MOUTH DAILY. (Patient taking differently: TAKE 1/2 TABLET (50 MG TOTAL) BY MOUTH DAILY.) 90 tablet 3  . KLOR-CON M20 20 MEQ tablet TAKE 1 TABLET BY MOUTH EVERY DAY WITH LASIX 30 tablet 3  . ondansetron (ZOFRAN) 4 MG tablet Take 1 tablet (4 mg total) by mouth every 8 (eight) hours as needed for nausea or vomiting. 20 tablet 1  . ranitidine (ZANTAC) 150 MG tablet Take 1 tablet (150 mg total) by mouth 2 (two) times daily as needed for heartburn (or abdominal pain).    . rosuvastatin (CRESTOR) 20 MG tablet Take 1 tablet (20 mg total) by mouth daily. 90 tablet 11  . gabapentin (NEURONTIN) 300 MG capsule Take 1-2 capsules (300-600 mg total) by mouth at bedtime as needed. (Patient not taking: Reported on 02/18/2016)    . gabapentin (NEURONTIN) 300 MG capsule TAKE 1 TO 2 CAPSULES(300 TO 600 MG) BY MOUTH THREE TIMES DAILY AS NEEDED (Patient not taking: Reported on 02/18/2016) 540 capsule 1   No current facility-administered medications for this visit.    REVIEW OF SYSTEMS:  [X]  denotes positive finding, [ ]  denotes negative finding Cardiac  Comments:  Chest pain or chest pressure:    Shortness of breath upon exertion:    Short of breath when lying flat:    Irregular heart rhythm:        Vascular    Pain in calf, thigh, or hip brought on by ambulation:    Pain in feet at night that wakes you up from your sleep:     Blood clot in your veins:    Leg swelling:         Pulmonary    Oxygen at home:    Productive cough:     Wheezing:         Neurologic    Sudden weakness in arms or legs:     Sudden numbness in arms or legs:     Sudden onset of difficulty speaking or slurred speech:    Temporary loss of vision in one eye:     Problems with dizziness:         Gastrointestinal    Blood in stool:     Vomited blood:         Genitourinary    Burning when urinating:     Blood in urine:  Psychiatric    Major depression:         Hematologic    Bleeding problems:    Problems with blood clotting too easily:        Skin    Rashes or ulcers:        Constitutional    Fever or chills:      PHYSICAL EXAM: Filed Vitals:   02/18/16 1238 02/18/16 1246 02/18/16 1253  BP: 154/71 167/67 154/62  Pulse: 78    Temp: 97.7 F (36.5 C)    TempSrc: Oral    Resp: 20    Height: 5\' 3"  (1.6 m)    Weight: 165 lb 4.8 oz (74.98 kg)      GENERAL: The patient is a well-nourished female, in no acute distress. The vital signs are documented above. CARDIAC: There is a regular rate and rhythm.  VASCULAR: 2+ radial pulses bilaterally. Soft left carotid bruit and no bruit on the right PULMONARY: There is good air exchange bilaterally without wheezing or rales. MUSCULOSKELETAL: There are no major deformities or cyanosis. NEUROLOGIC: No focal weakness or paresthesias are detected. SKIN: There are no ulcers or rashes noted. PSYCHIATRIC: The patient has a normal affect.  DATA:  Carotid duplex today reveals a lower degree of predicted stenosis and was last seen. Her left internal carotid artery stenosis is predicted in the 40-59% whereas her last data interpretation was in the 60-79% range. Right endarterectomy site shows no evidence of stenosis.  MEDICAL ISSUES: Stable overall. She will continue her usual activities and we will see her again in 6 months for repeat carotid duplex. She'll present to the emergency should she develop any neurologic deficits.    Rosetta Posner, MD FACS [facility] Tel [phone] Pager [phone]

## 2016-02-19 DIAGNOSIS — M545 Low back pain: Secondary | ICD-10-CM | POA: Diagnosis not present

## 2016-02-25 DIAGNOSIS — M545 Low back pain: Secondary | ICD-10-CM | POA: Diagnosis not present

## 2016-02-28 ENCOUNTER — Encounter: Payer: Self-pay | Admitting: Cardiology

## 2016-02-28 DIAGNOSIS — M545 Low back pain: Secondary | ICD-10-CM | POA: Diagnosis not present

## 2016-03-02 DIAGNOSIS — M545 Low back pain: Secondary | ICD-10-CM | POA: Diagnosis not present

## 2016-03-06 DIAGNOSIS — M545 Low back pain: Secondary | ICD-10-CM | POA: Diagnosis not present

## 2016-03-14 ENCOUNTER — Telehealth: Payer: Self-pay

## 2016-03-14 NOTE — Telephone Encounter (Signed)
Patient is on the list for [employer] 2017 and may be a good candidate for an AWV in 2017. Please let me know if/when appt is scheduled.   

## 2016-03-16 ENCOUNTER — Encounter: Payer: Self-pay | Admitting: Family Medicine

## 2016-03-16 ENCOUNTER — Ambulatory Visit (INDEPENDENT_AMBULATORY_CARE_PROVIDER_SITE_OTHER): Payer: Medicare HMO | Admitting: Family Medicine

## 2016-03-16 VITALS — BP 164/72 | HR 70 | Temp 98.4°F | Wt 165.5 lb

## 2016-03-16 DIAGNOSIS — L989 Disorder of the skin and subcutaneous tissue, unspecified: Secondary | ICD-10-CM

## 2016-03-16 DIAGNOSIS — M545 Low back pain: Secondary | ICD-10-CM | POA: Diagnosis not present

## 2016-03-16 MED ORDER — HYDROCHLOROTHIAZIDE 50 MG PO TABS: ORAL_TABLET | ORAL | Status: DC

## 2016-03-16 NOTE — Patient Instructions (Signed)
Use a bandaid when it blisters and it should heal over.  Take care.  Glad to see you.

## 2016-03-16 NOTE — Assessment & Plan Note (Signed)
Likely AK and BCC, d/w pt.  Reasonable to treat both with liq N2.  Will not have path review, but should treat the lesions.  We can readdress if not fully healed over later.  Routine cautions given, she agrees to proceed.  Each frozen/thawed x3 at OV with N2 w/o complication.  See AVS

## 2016-03-16 NOTE — Progress Notes (Signed)
Pre visit review using our clinic review tool, if applicable. No additional management support is needed unless otherwise documented below in the visit note.  She still has some pain after her surgery, similar to prev but not as severe.    Skin lesions.  61mm and 35mm lesion on L shin.  Itchy.  Irritated.  Won't heal.  Present for months.  Asking about options.   Meds, vitals, and allergies reviewed.   ROS: Per HPI unless specifically indicated in ROS section   nad Symmetric gait L shin with 19mm (looks to have rolled border as with a small BCC) and 60mm (irritaed, flaky, looks typical for small AK) lesion.

## 2016-03-18 DIAGNOSIS — M545 Low back pain: Secondary | ICD-10-CM | POA: Diagnosis not present

## 2016-03-23 DIAGNOSIS — M545 Low back pain: Secondary | ICD-10-CM | POA: Diagnosis not present

## 2016-03-25 DIAGNOSIS — M545 Low back pain: Secondary | ICD-10-CM | POA: Diagnosis not present

## 2016-03-31 ENCOUNTER — Other Ambulatory Visit: Payer: Self-pay | Admitting: *Deleted

## 2016-03-31 DIAGNOSIS — I6523 Occlusion and stenosis of bilateral carotid arteries: Secondary | ICD-10-CM

## 2016-04-01 ENCOUNTER — Other Ambulatory Visit: Payer: Self-pay | Admitting: Family Medicine

## 2016-04-01 NOTE — Telephone Encounter (Signed)
Please call in.  Thanks.   

## 2016-04-01 NOTE — Telephone Encounter (Signed)
Rx called to pharmacy as instructed. 

## 2016-05-05 ENCOUNTER — Ambulatory Visit (INDEPENDENT_AMBULATORY_CARE_PROVIDER_SITE_OTHER): Payer: Medicare HMO | Admitting: Family Medicine

## 2016-05-05 ENCOUNTER — Encounter: Payer: Self-pay | Admitting: Family Medicine

## 2016-05-05 VITALS — BP 144/70 | HR 63 | Temp 97.8°F | Wt 166.5 lb

## 2016-05-05 DIAGNOSIS — R300 Dysuria: Secondary | ICD-10-CM | POA: Diagnosis not present

## 2016-05-05 LAB — POC URINALSYSI DIPSTICK (AUTOMATED)
Bilirubin, UA: NEGATIVE
Glucose, UA: NEGATIVE
Ketones, UA: NEGATIVE
Leukocytes, UA: NEGATIVE
Nitrite, UA: NEGATIVE
Protein, UA: NEGATIVE
Spec Grav, UA: >=1.03
Urobilinogen, UA: 0.2
pH, UA: 6

## 2016-05-05 MED ORDER — SULFAMETHOXAZOLE-TRIMETHOPRIM 800-160 MG PO TABS: 1.0000 | ORAL_TABLET | Freq: Two times a day (BID) | ORAL | 0 refills | Status: DC

## 2016-05-05 NOTE — Progress Notes (Signed)
L shin lesions resolved w/o residual issues.  D/w pt.    Still with back pain.  Worse with getting up and walking.  Better with sitting down, worst with stepping up a step.  L ear pain.  Started about 1 week ago.  No fevers but had some chills.    Urinary complaint.  Pain with urination.  Started about 1 week ago, not since that initial day.  Now with frequency with less UOP per void.  Urgency.  Some lower abd pain.   Meds, vitals, and allergies reviewed.   ROS: Per HPI unless specifically indicated in ROS section   GEN: nad, alert and oriented HEENT: mucous membranes moist, left ear canal with wax present. Removed with irrigation and curette. Recheck normal. No complications. Her ear felt back to baseline after that. NECK: supple CV: rrr.  PULM: ctab, no inc wob ABD: soft, +bs, suprapubic area tender EXT: no edema SKIN: no acute rash BACK: no CVA pain

## 2016-05-05 NOTE — Progress Notes (Signed)
Pre visit review using our clinic review tool, if applicable. No additional management support is needed unless otherwise documented below in the visit note. 

## 2016-05-05 NOTE — Patient Instructions (Signed)
Drink plenty of water and start the antibiotics today.  We'll contact you with your lab report.  Take care.   

## 2016-05-06 ENCOUNTER — Encounter: Payer: Self-pay | Admitting: Family Medicine

## 2016-05-06 DIAGNOSIS — R300 Dysuria: Secondary | ICD-10-CM | POA: Insufficient documentation

## 2016-05-06 LAB — URINE CULTURE: Organism ID, Bacteria: 10000

## 2016-05-06 NOTE — Assessment & Plan Note (Signed)
Check urine culture, start Septra, drink plenty of fluids. Likely cystitis. Nontoxic. Discussed with patient. Follow-up when necessary. Left earwax removed incidentally.

## 2016-05-11 DIAGNOSIS — Z6829 Body mass index (BMI) 29.0-29.9, adult: Secondary | ICD-10-CM | POA: Diagnosis not present

## 2016-05-11 DIAGNOSIS — M47816 Spondylosis without myelopathy or radiculopathy, lumbar region: Secondary | ICD-10-CM | POA: Diagnosis not present

## 2016-05-19 ENCOUNTER — Other Ambulatory Visit: Payer: Self-pay | Admitting: Neurosurgery

## 2016-05-19 DIAGNOSIS — M47896 Other spondylosis, lumbar region: Secondary | ICD-10-CM

## 2016-05-24 ENCOUNTER — Ambulatory Visit
Admission: RE | Admit: 2016-05-24 | Discharge: 2016-05-24 | Disposition: A | Discharge status: Home / Self Care | Payer: Medicare HMO | Source: Ambulatory Visit | Attending: Neurosurgery | Admitting: Neurosurgery

## 2016-05-24 DIAGNOSIS — M5126 Other intervertebral disc displacement, lumbar region: Secondary | ICD-10-CM | POA: Diagnosis not present

## 2016-05-24 DIAGNOSIS — M47896 Other spondylosis, lumbar region: Secondary | ICD-10-CM

## 2016-05-24 MED ORDER — GADOBENATE DIMEGLUMINE 529 MG/ML IV SOLN
15.0000 mL | Freq: Once | INTRAVENOUS | Status: AC | PRN
  Administered 2016-05-24: 15 mL via INTRAVENOUS

## 2016-06-03 DIAGNOSIS — M47816 Spondylosis without myelopathy or radiculopathy, lumbar region: Secondary | ICD-10-CM | POA: Diagnosis not present

## 2016-06-03 DIAGNOSIS — Z6830 Body mass index (BMI) 30.0-30.9, adult: Secondary | ICD-10-CM | POA: Diagnosis not present

## 2016-06-05 ENCOUNTER — Encounter: Payer: Self-pay | Admitting: Family Medicine

## 2016-06-05 ENCOUNTER — Ambulatory Visit (INDEPENDENT_AMBULATORY_CARE_PROVIDER_SITE_OTHER): Payer: Medicare HMO | Admitting: Family Medicine

## 2016-06-05 DIAGNOSIS — J0100 Acute maxillary sinusitis, unspecified: Secondary | ICD-10-CM | POA: Diagnosis not present

## 2016-06-05 MED ORDER — DOXYCYCLINE HYCLATE 100 MG PO TABS: 100.0000 mg | ORAL_TABLET | Freq: Two times a day (BID) | ORAL | 0 refills | Status: DC

## 2016-06-05 NOTE — Progress Notes (Signed)
Pre visit review using our clinic review tool, if applicable. No additional management support is needed unless otherwise documented below in the visit note. 

## 2016-06-05 NOTE — Progress Notes (Signed)
Sx started about 1 week ago.  Initially with sneezing, rhinorrhea, coughing all night, initially with fever and chills and then sweats.  Coughing is still annoying for patient.  No sputum.  Facial pain, ear pain.  ST.  No vomiting, no diarrhea.  No sick contacts.    Her brother in law recently died.  I offered condolences.  Her back pain is clearly better after surgery in May for spinal stenosis.  Meds, vitals, and allergies reviewed.   ROS: Per HPI unless specifically indicated in ROS section   GEN: nad, alert and oriented HEENT: mucous membranes moist, tm w/o erythema, nasal exam w/o erythema, clear discharge noted,  OP with cobblestoning, max sinuses ttp B NECK: supple w/o LA CV: rrr.   PULM: ctab, no inc wob EXT: no edema

## 2016-06-05 NOTE — Patient Instructions (Addendum)
Start doxycycline and update me as needed.  Rest and fluids.  Take care.  Glad to see you.

## 2016-06-06 NOTE — Assessment & Plan Note (Signed)
Start doxycycline and update me as needed.  Rest and fluids.  Nontoxic.  She agrees.

## 2016-06-11 DIAGNOSIS — H6123 Impacted cerumen, bilateral: Secondary | ICD-10-CM | POA: Diagnosis not present

## 2016-06-11 DIAGNOSIS — H6993 Unspecified Eustachian tube disorder, bilateral: Secondary | ICD-10-CM | POA: Diagnosis not present

## 2016-06-11 DIAGNOSIS — J3089 Other allergic rhinitis: Secondary | ICD-10-CM | POA: Diagnosis not present

## 2016-06-23 DIAGNOSIS — R69 Illness, unspecified: Secondary | ICD-10-CM | POA: Diagnosis not present

## 2016-06-24 DIAGNOSIS — R69 Illness, unspecified: Secondary | ICD-10-CM | POA: Diagnosis not present

## 2016-06-24 DIAGNOSIS — J310 Chronic rhinitis: Secondary | ICD-10-CM | POA: Diagnosis not present

## 2016-06-29 ENCOUNTER — Ambulatory Visit (INDEPENDENT_AMBULATORY_CARE_PROVIDER_SITE_OTHER): Payer: Medicare HMO | Admitting: Cardiovascular Disease

## 2016-06-29 ENCOUNTER — Encounter: Payer: Self-pay | Admitting: Cardiovascular Disease

## 2016-06-29 VITALS — BP 150/70 | HR 70 | Ht 63.0 in | Wt 161.4 lb

## 2016-06-29 DIAGNOSIS — I6523 Occlusion and stenosis of bilateral carotid arteries: Secondary | ICD-10-CM | POA: Diagnosis not present

## 2016-06-29 DIAGNOSIS — E7849 Other hyperlipidemia: Secondary | ICD-10-CM

## 2016-06-29 DIAGNOSIS — I10 Essential (primary) hypertension: Secondary | ICD-10-CM

## 2016-06-29 DIAGNOSIS — E785 Hyperlipidemia, unspecified: Secondary | ICD-10-CM | POA: Diagnosis not present

## 2016-06-29 DIAGNOSIS — E784 Other hyperlipidemia: Secondary | ICD-10-CM | POA: Diagnosis not present

## 2016-06-29 DIAGNOSIS — I25810 Atherosclerosis of coronary artery bypass graft(s) without angina pectoris: Secondary | ICD-10-CM

## 2016-06-29 MED ORDER — EZETIMIBE 10 MG PO TABS: 10.0000 mg | ORAL_TABLET | Freq: Every day | ORAL | 3 refills | Status: DC

## 2016-06-29 MED ORDER — ROSUVASTATIN CALCIUM 20 MG PO TABS: 20.0000 mg | ORAL_TABLET | Freq: Every day | ORAL | 3 refills | Status: DC

## 2016-06-29 NOTE — Progress Notes (Signed)
Cardiology Office Note  Date:  06/29/2016   ID:  Green, Catherine [birth year], MRN [id number]  PCP:  Elsie Stain, MD   Chief Complaint  Patient presents with  . Carotid stenosis, bilateral  . Hypertension    HPI:  Catherine Green is a very pleasant 75 year-old woman with history coronary artery disease, bypass surgery, peripheral vascular disease with history of right carotid endarterectomy, as well as  left CEA followed by [name], who presents for routine followup of her coronary artery disease.  Last catheterization in January 2009 showing patent vein grafts, atretic LIMA to the LAD.    problems with anxiety in the past  In follow-up, she reports having Back surgery in May 2017, Taking a while to get over it Did PT No regular exercise program  She is tolerating crestor 20 daily with zetia Reports blood pressure stable but mildly elevated on today's visit. She did not take her medications this morning  Scheduled Dec 2017 u/s cariotid in [place]  Previous MRI of her back reviewed with her showing DJD, foraminal narrowing Seen by [name].   EKG on today's visit shows normal sinus rhythm with rate 69 bpm, nonspecific ST abnormality, rare APC  Other past medical history Previously went on a cruise and developed pneumonia, required ABX Reports having labile blood pressure.   Previous symptoms of shortness of breath and weight gain. Improved with Lasix to take as needed.  She had her gallbladder taken out 03/13/2014.  she had "hair loss" on simvastatin, as well as sores on her head. Lipitor caused leg problems/myalgias.   Previously stopped her "nerve" pill. Significant stressors as husband is sick. She was previously using the treadmill on a regular basis       PMH:   has a past medical history of Allergy; Anemia; Anxiety; Carotid artery occlusion; Cerebrovascular disease; Chicken pox; Coronary artery disease; Depression; Dizziness; Dizziness; DVT (deep venous  thrombosis) ([place]); Fainting spell; GERD (gastroesophageal reflux disease); Headache; Heart murmur; Hyperlipidemia; Hypertension; PONV (postoperative nausea and vomiting); Pre-syncope; PVD (peripheral vascular disease) ([place]); Seasonal allergies; Shortness of breath dyspnea; Swelling of both ankles; Thrombophlebitis; and Ulcer ([place]).  PSH:    Past Surgical History:  Procedure Laterality Date  . APPENDECTOMY    . BACK SURGERY  01/14/16  . BREAST SURGERY     saline implants  . CARDIAC CATHETERIZATION    . CAROTID ENDARTERECTOMY  1992  . CAROTID ENDARTERECTOMY Right February 13, 2005   Re-do Right CE  . CATARACT EXTRACTION, BILATERAL  2013  . CHOLECYSTECTOMY, LAPAROSCOPIC  04/02/14   Dr. Rochel Brome  . CORONARY ARTERY BYPASS GRAFT  2006   x3 Dr. Prescott Gum  . ENDARTERECTOMY Left 07/20/2014   Procedure: ENDARTERECTOMY CAROTID-LEFT;  Surgeon: Rosetta Posner, MD;  Location: [facility];  Service: Vascular;  Laterality: Left;  . EYE SURGERY Bilateral Feb. 2013   Cataract Left eye  . SEPTOPLASTY     with bilateral inferior turbinate reductions  . SPINE SURGERY  12/2015  . TOTAL ABDOMINAL HYSTERECTOMY    . UPPER GASTROINTESTINAL ENDOSCOPY     had polyps removed from esophagus  . UPPER GI ENDOSCOPY      Current Outpatient Prescriptions  Medication Sig Dispense Refill  . ALPRAZolam (XANAX) 0.25 MG tablet TAKE 1 TABLET BY MOUTH EVERY DAY AS NEEDED FOR ANXIETY 30 tablet 2  . aspirin 81 MG tablet Take 162 mg by mouth daily.     . carvedilol (COREG) 3.125 MG tablet Take 1  tab by mouth twice daily 180 tablet 3  . ezetimibe (ZETIA) 10 MG tablet Take 1 tablet (10 mg total) by mouth daily. 90 tablet 3  . furosemide (LASIX) 20 MG tablet TAKE 1 TABLET (20 MG TOTAL) BY MOUTH DAILY (Patient taking differently: Take 20 mg by mouth daily as needed. TAKE 1 TABLET (20 MG TOTAL) BY MOUTH DAILY) 30 tablet 3  . hydrochlorothiazide (HYDRODIURIL) 50 MG tablet TAKE 1/2 TABLET (50 MG TOTAL) BY MOUTH DAILY.    Marland Kitchen KLOR-CON M20 20  MEQ tablet TAKE 1 TABLET BY MOUTH EVERY DAY WITH LASIX 30 tablet 3  . ranitidine (ZANTAC) 150 MG tablet Take 1 tablet (150 mg total) by mouth 2 (two) times daily as needed for heartburn (or abdominal pain).    . rosuvastatin (CRESTOR) 20 MG tablet Take 1 tablet (20 mg total) by mouth daily. 90 tablet 3   No current facility-administered medications for this visit.      Allergies:   Lipitor [atorvastatin]; Oxycodone hcl; Protonix [pantoprazole sodium]; Hydrocodone; Metoprolol; Penicillin g; Tramadol; Trazodone and nefazodone; Zoloft [sertraline]; Augmentin [amoxicillin-pot clavulanate]; Codeine; Demerol; Ivp dye [iodinated diagnostic agents]; and Shellfish allergy   Social History:  The patient  reports that she quit smoking about 19 years ago. Her smoking use included Cigarettes. She has a 4.50 pack-year smoking history. She has never used smokeless tobacco. She reports that she does not drink alcohol or use drugs.   Family History:   family history includes Aneurysm in her brother and mother; Birth defects in her brother; Bleeding Disorder in her mother and sister; Cancer in her sister; Cirrhosis in her father; Deep vein thrombosis in her brother; Heart attack in her father; Heart disease in her brother, father, mother, and sister; Hyperlipidemia in her brother, daughter, father, mother, and sister; Hypertension in her brother, daughter, father, mother, other, and sister; Peripheral vascular disease in her daughter; Varicose Veins in her mother and sister.    Review of Systems: Review of Systems  Constitutional: Negative.   Respiratory: Negative.   Cardiovascular: Negative.   Gastrointestinal: Negative.   Musculoskeletal: Positive for back pain.  Neurological: Negative.   Psychiatric/Behavioral: Negative.   All other systems reviewed and are negative.    PHYSICAL EXAM: VS:  BP (!) 150/70   Pulse 70   Ht 5\' 3"  (1.6 m)   Wt 161 lb 6.4 oz (73.2 kg)   SpO2 98%   BMI 28.59 kg/m  , BMI  Body mass index is 28.59 kg/m. GEN: Well nourished, well developed, in no acute distress  HEENT: normal  Neck: no JVD, carotid bruits, or masses Cardiac: RRR; no murmurs, rubs, or gallops,no edema  Respiratory:  clear to auscultation bilaterally, normal work of breathing GI: soft, nontender, nondistended, + BS MS: no deformity or atrophy  Skin: warm and dry, no rash Neuro:  Strength and sensation are intact Psych: euthymic mood, full affect    Recent Labs: No results found for requested labs within last 8760 hours.    Lipid Panel Lab Results  Component Value Date   CHOL 261 (H) 05/22/2015   HDL 46 05/22/2015   LDLCALC Comment 05/22/2015   TRIG 426 (H) 05/22/2015      Wt Readings from Last 3 Encounters:  06/29/16 161 lb 6.4 oz (73.2 kg)  06/05/16 162 lb 12 oz (73.8 kg)  05/05/16 166 lb 8 oz (75.5 kg)       ASSESSMENT AND PLAN:  Carotid stenosis, bilateral - Plan: EKG 12-Lead, Hepatic function panel, Lipid  Profile She has follow-up in [place], ultrasound in December We have stressed the importance of aggressive cholesterol management  Essential hypertension - Plan: EKG 12-Lead, Hepatic function panel, Lipid Profile Recommended she monitor blood pressure at home. She did not take her medications today  Hyperlipidemia, unspecified hyperlipidemia type - Plan: Hepatic function panel, Lipid Profile We have ordered a lipid panel, LFTs Long discussion concerning other pharmacies that might provide cheaper medications Websites reviewed for assistance. Both her Crestor and Zetia are very expensive despite being generic.  Familial hyperlipidemia  Bilateral carotid artery stenosis  Atherosclerosis of coronary artery bypass graft of native heart without angina pectoris Currently with no anginal symptoms No further testing at this time   Total encounter time more than 25 minutes  Greater than 50% was spent in counseling and coordination of care with the  patient   Disposition:   F/U  6 months   Orders Placed This Encounter  Procedures  . Hepatic function panel  . Lipid Profile  . EKG 12-Lead     Signed, Esmond Plants, M.D., Ph.D. 06/29/2016  [place], [place]

## 2016-06-29 NOTE — Patient Instructions (Signed)
Medication Instructions:   No medication changes made  Check www.goodrx.com for medication proces  Labwork:  We will check Lipids and Liver today  Testing/Procedures:  No further testing at this time   Follow-Up: It was a pleasure seeing you in the office today. Please call us if you have new issues that need to be addressed before your next appt.  [phone]  Your physician wants you to follow-up in: 6 months.  You will receive a reminder letter in the mail two months in advance. If you don't receive a letter, please call our office to schedule the follow-up appointment.  If you need a refill on your cardiac medications before your next appointment, please call your pharmacy.

## 2016-06-30 LAB — LIPID PANEL
Chol/HDL Ratio: 2.4 ratio units (ref 0.0–4.4)
Cholesterol, Total: 136 mg/dL (ref 100–199)
HDL: 57 mg/dL (ref >39)
LDL Calculated: 55 mg/dL (ref 0–99)
Triglycerides: 120 mg/dL (ref 0–149)
VLDL Cholesterol Cal: 24 mg/dL (ref 5–40)

## 2016-06-30 LAB — HEPATIC FUNCTION PANEL
ALT: 23 IU/L (ref 0–32)
AST: 26 IU/L (ref 0–40)
Albumin: 4.6 g/dL (ref 3.5–4.8)
Alkaline Phosphatase: 83 IU/L (ref 39–117)
Bilirubin Total: 0.5 mg/dL (ref 0.0–1.2)
Bilirubin, Direct: 0.17 mg/dL (ref 0.00–0.40)
Total Protein: 7 g/dL (ref 6.0–8.5)

## 2016-07-03 ENCOUNTER — Telehealth: Payer: Self-pay | Admitting: Cardiovascular Disease

## 2016-07-03 NOTE — Telephone Encounter (Signed)
Patient wants lab results

## 2016-07-03 NOTE — Telephone Encounter (Signed)
Please see result note 

## 2016-07-08 DIAGNOSIS — H26493 Other secondary cataract, bilateral: Secondary | ICD-10-CM | POA: Diagnosis not present

## 2016-07-16 ENCOUNTER — Other Ambulatory Visit: Payer: Self-pay

## 2016-07-16 MED ORDER — ALPRAZOLAM 0.25 MG PO TABS: ORAL_TABLET | ORAL | 2 refills | Status: DC

## 2016-07-16 NOTE — Telephone Encounter (Signed)
Please call in.  Thanks.   

## 2016-07-16 NOTE — Telephone Encounter (Signed)
Last filled 04-01-16 #30 Last OV Acute 06-05-16 No Future OV

## 2016-07-17 NOTE — Telephone Encounter (Signed)
Medication phoned to pharmacy.  

## 2016-07-30 DIAGNOSIS — H26491 Other secondary cataract, right eye: Secondary | ICD-10-CM | POA: Diagnosis not present

## 2016-08-06 DIAGNOSIS — H26492 Other secondary cataract, left eye: Secondary | ICD-10-CM | POA: Diagnosis not present

## 2016-08-10 ENCOUNTER — Other Ambulatory Visit: Payer: Self-pay | Admitting: Cardiovascular Disease

## 2016-08-11 ENCOUNTER — Encounter: Payer: Self-pay | Admitting: Vascular Surgery

## 2016-08-18 ENCOUNTER — Ambulatory Visit (HOSPITAL_COMMUNITY)
Admission: RE | Admit: 2016-08-18 | Discharge: 2016-08-18 | Disposition: A | Discharge status: Home / Self Care | Payer: Medicare HMO | Source: Ambulatory Visit | Attending: Vascular Surgery | Admitting: Vascular Surgery

## 2016-08-18 ENCOUNTER — Ambulatory Visit (INDEPENDENT_AMBULATORY_CARE_PROVIDER_SITE_OTHER): Payer: Medicare HMO | Admitting: Vascular Surgery

## 2016-08-18 VITALS — BP 189/81 | HR 66 | Temp 97.8°F | Resp 18 | Ht 63.0 in | Wt 160.0 lb

## 2016-08-18 DIAGNOSIS — I6523 Occlusion and stenosis of bilateral carotid arteries: Secondary | ICD-10-CM | POA: Diagnosis not present

## 2016-08-18 LAB — VAS US CAROTID
LEFT ECA DIAS: -15 cm/s
Left CCA dist dias: 26 cm/s
Left CCA dist sys: 129 cm/s
Left CCA prox dias: 21 cm/s
Left CCA prox sys: 122 cm/s
Left ICA dist dias: -27 cm/s
Left ICA dist sys: -115 cm/s
Left ICA prox dias: -41 cm/s
Left ICA prox sys: -221 cm/s
RIGHT CCA MID DIAS: 24 cm/s
Right CCA prox dias: 18 cm/s
Right CCA prox sys: 84 cm/s
Right cca dist sys: -100 cm/s

## 2016-08-18 NOTE — Addendum Note (Signed)
Addended by: Lianne Cure A on: 08/18/2016 04:15 PM   Modules accepted: Orders

## 2016-08-18 NOTE — Progress Notes (Signed)
[facility]  Patient name: Catherine Green MRN: [id number] DOB: 1941/01/20 Sex: female  REASON FOR VISIT: Follow-up carotid disease  HPI: TIAJAH Green is a 75 y.o. female who presents for continued follow-up of asymptomatic carotid artery disease. She previously underwent right carotid endarterectomy in 1992 with redo in 2006 by [name]. She also previously underwent left carotid endarterectomy in 2015. She denies any episode of amaurosis fugax, sudden onset weakness or numbness of the extremities and expressive aphasia.  She recently underwent back surgery in May and laser eye surgery 2 weeks ago. She takes aspirin daily and a statin. Nonsmoker.  She is extremely nervous at today's office visit and states that her blood pressure secondary to this.  Past Medical History:  Diagnosis Date  . Allergy    hay fever  . Anemia   . Anxiety   . Carotid artery occlusion   . Cerebrovascular disease    extracranial; occlusive  . Chicken pox   . Coronary artery disease   . Depression   . Dizziness   . Dizziness   . DVT (deep venous thrombosis) ([place])   . Fainting spell   . GERD (gastroesophageal reflux disease)   . Headache   . Heart murmur   . Hyperlipidemia   . Hypertension   . PONV (postoperative nausea and vomiting)    severe nausea and vomiting  . Pre-syncope   . PVD (peripheral vascular disease) ([place])    endarterectomy by [name]  . Seasonal allergies   . Shortness of breath dyspnea    wth ambulation at times  . Swelling of both ankles    and abdomen; takes Lasix when needed  . Thrombophlebitis    following childbirth  . Ulcer ([place])     Family History  Problem Relation Age of Onset  . Aneurysm Mother     brain  . Heart disease Mother     Aneyursm   . Hyperlipidemia Mother   . Hypertension Mother   . Varicose Veins Mother   . Bleeding Disorder Mother   . Heart disease Father   . Cirrhosis Father   . Heart attack Father   .  Hyperlipidemia Father   . Hypertension Father   . Heart disease Brother     Before age 84  . Aneurysm Brother   . Deep vein thrombosis Brother   . Birth defects Brother   . Hyperlipidemia Brother   . Hypertension Brother   . Hypertension Other   . Cancer Sister     lung  . Heart disease Sister     Aneurysm  . Hyperlipidemia Sister   . Hypertension Sister   . Varicose Veins Sister   . Peripheral vascular disease Daughter   . Hyperlipidemia Daughter   . Hypertension Daughter   . Bleeding Disorder Sister   . Breast cancer Neg Hx   . Colon cancer Neg Hx     SOCIAL HISTORY: Social History  Substance Use Topics  . Smoking status: Former Smoker    Packs/day: 0.30    Years: 15.00    Types: Cigarettes    Quit date: 08/31/1996  . Smokeless tobacco: Never Used  . Alcohol use No    Allergies  Allergen Reactions  . Lipitor [Atorvastatin] Nausea And Vomiting  . Oxycodone Hcl Other (See Comments)    Hallucinations   . Protonix [Pantoprazole Sodium] Nausea Only  . Hydrocodone Other (See Comments)    hallucinate  . Metoprolol Other (See  Comments)    Slowed body down, per pt   . Penicillin G Nausea Only and Other (See Comments)    N&V  . Tramadol Other (See Comments)    Sedation   . Trazodone And Nefazodone Other (See Comments)    Sedation   . Zoloft [Sertraline] Other (See Comments)    Excessive sweating  . Augmentin [Amoxicillin-Pot Clavulanate] Nausea And Vomiting  . Codeine Nausea And Vomiting and Other (See Comments)  . Demerol Nausea And Vomiting  . Ivp Dye [Iodinated Diagnostic Agents] Hives and Itching       . Shellfish Allergy Hives and Itching    Current Outpatient Prescriptions  Medication Sig Dispense Refill  . ALPRAZolam (XANAX) 0.25 MG tablet TAKE 1 TABLET BY MOUTH EVERY DAY AS NEEDED FOR ANXIETY 30 tablet 2  . aspirin 81 MG tablet Take 162 mg by mouth daily.     . carvedilol (COREG) 3.125 MG tablet TAKE 1 TABLET BY MOUTH TWICE DAILY 180 tablet 1  .  ezetimibe (ZETIA) 10 MG tablet Take 1 tablet (10 mg total) by mouth daily. 90 tablet 3  . furosemide (LASIX) 20 MG tablet TAKE 1 TABLET (20 MG TOTAL) BY MOUTH DAILY (Patient taking differently: Take 20 mg by mouth daily as needed. TAKE 1 TABLET (20 MG TOTAL) BY MOUTH DAILY) 30 tablet 3  . hydrochlorothiazide (HYDRODIURIL) 50 MG tablet TAKE 1/2 TABLET (50 MG TOTAL) BY MOUTH DAILY.    Marland Kitchen KLOR-CON M20 20 MEQ tablet TAKE 1 TABLET BY MOUTH EVERY DAY WITH LASIX 30 tablet 3  . ranitidine (ZANTAC) 150 MG tablet Take 1 tablet (150 mg total) by mouth 2 (two) times daily as needed for heartburn (or abdominal pain).    . rosuvastatin (CRESTOR) 20 MG tablet Take 1 tablet (20 mg total) by mouth daily. 90 tablet 3   No current facility-administered medications for this visit.     REVIEW OF SYSTEMS:  [X]  denotes positive finding, [ ]  denotes negative finding Cardiac  Comments:  Chest pain or chest pressure:    Shortness of breath upon exertion:    Short of breath when lying flat:    Irregular heart rhythm:        Vascular    Pain in calf, thigh, or hip brought on by ambulation:    Pain in feet at night that wakes you up from your sleep:     Blood clot in your veins:    Leg swelling:         Pulmonary    Oxygen at home:    Productive cough:     Wheezing:         Neurologic    Sudden weakness in arms or legs:     Sudden numbness in arms or legs:     Sudden onset of difficulty speaking or slurred speech:    Temporary loss of vision in one eye:     Problems with dizziness:         Gastrointestinal    Blood in stool:     Vomited blood:         Genitourinary    Burning when urinating:     Blood in urine:        Psychiatric    Major depression:         Hematologic    Bleeding problems:    Problems with blood clotting too easily:        Skin    Rashes or ulcers:  Constitutional    Fever or chills:      PHYSICAL EXAM: Vitals:   08/18/16 1442 08/18/16 1443  BP: (!) 178/76 (!)  189/81  Pulse: 66   Resp: 18   Temp: 97.8 F (36.6 C)   TempSrc: Oral   SpO2: 95%   Weight: 160 lb (72.6 kg)   Height: 5\' 3"  (1.6 m)     GENERAL: The patient is a well-nourished female, in no acute distress. The vital signs are documented above. CARDIAC: There is a regular rate and rhythm. Bilateral carotid bruits. VASCULAR: 2+ right radial pulse. Nonpalpable left radial pulse. PULMONARY: There is good air exchange bilaterally without wheezing or rales. MUSCULOSKELETAL: There are no major deformities or cyanosis. NEUROLOGIC: No focal weakness or paresthesias are detected. SKIN: There are no ulcers or rashes noted. PSYCHIATRIC: The patient has a normal affect.  DATA:  Carotid duplex 08/18/2016  Right carotid endarterectomy site is patent. There is 40-59% left proximal internal carotid artery stenosis. Vertebral arteries are patent with antegrade flow bilaterally.  MEDICAL ISSUES: Asymptomatic bilateral carotid artery stenosis Status post right carotid endarterectomy 2015, status post left carotid endarterectomy 1922 with revision in 2006  The patient denies any TIA or stroke symptoms. Her carotid duplex results have been stable since her last exam 6 months ago. She is extremely nervous in the office today and states that her blood pressure is elevated secondary to this. She states she is nervous because of having had redo carotid surgery in the past. She is on maximal medical management with aspirin and a statin. She is a former smoker. She'll follow up in 6 months with repeat carotid duplex. She knows to call and/or seek emergency assistance if she develops any new neurologic symptoms.   Virgina Jock, PA-C [facility] [place]

## 2016-08-25 ENCOUNTER — Ambulatory Visit: Payer: Medicare HMO | Admitting: Vascular Surgery

## 2016-08-25 ENCOUNTER — Encounter (HOSPITAL_COMMUNITY): Payer: Medicare HMO

## 2016-09-02 DIAGNOSIS — Z6829 Body mass index (BMI) 29.0-29.9, adult: Secondary | ICD-10-CM | POA: Diagnosis not present

## 2016-09-02 DIAGNOSIS — M47816 Spondylosis without myelopathy or radiculopathy, lumbar region: Secondary | ICD-10-CM | POA: Diagnosis not present

## 2016-09-21 ENCOUNTER — Encounter: Payer: Self-pay | Admitting: Family Medicine

## 2016-09-21 ENCOUNTER — Ambulatory Visit (INDEPENDENT_AMBULATORY_CARE_PROVIDER_SITE_OTHER): Payer: Medicare HMO | Admitting: Family Medicine

## 2016-09-21 DIAGNOSIS — R05 Cough: Secondary | ICD-10-CM | POA: Diagnosis not present

## 2016-09-21 DIAGNOSIS — R059 Cough, unspecified: Secondary | ICD-10-CM

## 2016-09-21 MED ORDER — ALBUTEROL SULFATE HFA 108 (90 BASE) MCG/ACT IN AERS: 2.0000 | INHALATION_SPRAY | Freq: Four times a day (QID) | RESPIRATORY_TRACT | 0 refills | Status: DC | PRN

## 2016-09-21 MED ORDER — PREDNISONE 20 MG PO TABS: 20.0000 mg | ORAL_TABLET | Freq: Every day | ORAL | 0 refills | Status: DC

## 2016-09-21 MED ORDER — CLARITHROMYCIN 500 MG PO TABS: 500.0000 mg | ORAL_TABLET | Freq: Two times a day (BID) | ORAL | 0 refills | Status: DC

## 2016-09-21 MED ORDER — ONDANSETRON HCL 4 MG PO TABS: 4.0000 mg | ORAL_TABLET | Freq: Three times a day (TID) | ORAL | 0 refills | Status: DC | PRN

## 2016-09-21 MED ORDER — ALPRAZOLAM 0.25 MG PO TABS
ORAL_TABLET | ORAL | 2 refills | Status: DC
Start: 2016-09-21 — End: 2017-05-24

## 2016-09-21 NOTE — Progress Notes (Signed)
Cough.  Started about 1.5 weeks ago.  Still coughing.  No sputum usually, but when she gets some up it is thick.  Fevers noted a few days ago, not since.  Dry heaving.  No diarrhea.  Rhinorrhea.  Some ear pain.  No facial pain.  Most of her sx are in her chest.  Cough continues to get worse today.  She took zofran prev for nausea.    She is off all her pain meds.  Needed refill on xanax.    Husband was recently sick.    Meds, vitals, and allergies reviewed.   ROS: Per HPI unless specifically indicated in ROS section   GEN: nad, alert and oriented HEENT: mucous membranes moist, tm w/o erythema, nasal exam w/o erythema, clear discharge noted,  OP with cobblestoning NECK: supple w/o LA CV: rrr.   PULM: ctab on inhalation but B diffuse coarse B on exhalation, no inc wob EXT: no edema SKIN: no acute rash

## 2016-09-21 NOTE — Patient Instructions (Signed)
Start prednisone, use the inhaler, start biaxin and use zofran if needed for nausea.   Rest and fluids.  Update me as needed.  Take care.  Glad to see you.

## 2016-09-21 NOTE — Progress Notes (Signed)
Pre visit review using our clinic review tool, if applicable. No additional management support is needed unless otherwise documented below in the visit note. 

## 2016-09-21 NOTE — Assessment & Plan Note (Signed)
Likely bronchitis, no focal dec in BS. D/w pt   Okay for outpatient f/u.  Start prednisone, use SABA prn, start biaxin and use zofran if needed for nausea.   Rest and fluids.  Update me as needed.  She agrees.

## 2016-10-23 DIAGNOSIS — J310 Chronic rhinitis: Secondary | ICD-10-CM | POA: Diagnosis not present

## 2016-10-23 DIAGNOSIS — H903 Sensorineural hearing loss, bilateral: Secondary | ICD-10-CM | POA: Diagnosis not present

## 2016-12-23 DIAGNOSIS — R69 Illness, unspecified: Secondary | ICD-10-CM | POA: Diagnosis not present

## 2016-12-26 NOTE — Progress Notes (Signed)
Cardiology Office Note  Date:  12/28/2016   ID:  Catherine Green, DOB Jan 17, 1941, MRN [id number]  PCP:  Elsie Stain, MD   Chief Complaint  Patient presents with  . other    77mo f/u. Pt  Reviewed meds with pt verbally.    HPI:  Catherine Green is a very pleasant 76 year-old woman with history  coronary artery disease,  bypass surgery,  peripheral vascular disease   right CEA, left CEA followed by [name], catheterization January 2009 showing patent vein grafts, atretic LIMA to the LAD. Hx of anxiety Back surgery in May 2017,  who presents for routine followup of her coronary artery disease.    Back surgery in May 2017, No regular exercise program  tolerating crestor 20 daily with zetia Lipid panel at goal November 2017 Medication compliant   Dec 2017 u/s cariotid in [facility] Stable disease  Previous MRI of her back reviewed with her showing DJD, foraminal narrowing Seen by [name].   Lab work reviewed with her in detail On today's visit Total cholesterol 136, LDL 55  EKG personally reviewed by myself on todays visit shows normal sinus rhythm with rate 56 bpm, nonspecific T abnormality  Other past medical history Previously went on a cruise and developed pneumonia, required ABX Reports having labile blood pressure.   Previous symptoms of shortness of breath and weight gain. Improved with Lasix to take as needed.  She had her gallbladder taken out 03/13/2014.  she had "hair loss" on simvastatin, as well as sores on her head. Lipitor caused leg problems/myalgias.   Previously stopped her "nerve" pill. Significant stressors as husband is sick. She was previously using the treadmill on a regular basis       PMH:   has a past medical history of Allergy; Anemia; Anxiety; Carotid artery occlusion; Cerebrovascular disease; Chicken pox; Coronary artery disease; Depression; Dizziness; Dizziness; DVT (deep venous thrombosis) ([place]); Fainting spell; GERD  (gastroesophageal reflux disease); Headache; Heart murmur; Hyperlipidemia; Hypertension; PONV (postoperative nausea and vomiting); Pre-syncope; PVD (peripheral vascular disease) ([place]); Seasonal allergies; Shortness of breath dyspnea; Swelling of both ankles; Thrombophlebitis; and Ulcer.  PSH:    Past Surgical History:  Procedure Laterality Date  . APPENDECTOMY    . BACK SURGERY  01/14/16  . BREAST SURGERY     saline implants  . CARDIAC CATHETERIZATION    . CAROTID ENDARTERECTOMY  1992  . CAROTID ENDARTERECTOMY Right February 13, 2005   Re-do Right CE  . CATARACT EXTRACTION, BILATERAL  2013  . CHOLECYSTECTOMY, LAPAROSCOPIC  04/02/14   Dr. Rochel Brome  . CORONARY ARTERY BYPASS GRAFT  2006   x3 Dr. Prescott Gum  . ENDARTERECTOMY Left 07/20/2014   Procedure: ENDARTERECTOMY CAROTID-LEFT;  Surgeon: Rosetta Posner, MD;  Location: [facility];  Service: Vascular;  Laterality: Left;  . EYE SURGERY Bilateral Feb. 2013   Cataract Left eye  . SEPTOPLASTY     with bilateral inferior turbinate reductions  . SPINE SURGERY  12/2015  . TOTAL ABDOMINAL HYSTERECTOMY    . UPPER GASTROINTESTINAL ENDOSCOPY     had polyps removed from esophagus  . UPPER GI ENDOSCOPY      Current Outpatient Prescriptions  Medication Sig Dispense Refill  . ALPRAZolam (XANAX) 0.25 MG tablet TAKE 1 TABLET BY MOUTH EVERY DAY AS NEEDED FOR ANXIETY 30 tablet 2  . aspirin 81 MG tablet Take 162 mg by mouth daily.     . carvedilol (COREG) 3.125 MG tablet TAKE 1 TABLET BY MOUTH TWICE  DAILY 180 tablet 1  . clarithromycin (BIAXIN) 500 MG tablet Take 1 tablet (500 mg total) by mouth 2 (two) times daily. 14 tablet 0  . Coenzyme Q10 (COQ10 PO) Take by mouth daily.    . cyanocobalamin (V-R VITAMIN B-12) 500 MCG tablet Take 500 mcg by mouth as needed.    . ezetimibe (ZETIA) 10 MG tablet Take 1 tablet (10 mg total) by mouth daily. 90 tablet 3  . guaiFENesin (MUCINEX) 600 MG 12 hr tablet Take 600 mg by mouth daily.    . hydrochlorothiazide  (HYDRODIURIL) 50 MG tablet TAKE 1/2 TABLET (50 MG TOTAL) BY MOUTH DAILY.    Marland Kitchen KLOR-CON M20 20 MEQ tablet TAKE 1 TABLET BY MOUTH EVERY DAY WITH LASIX 30 tablet 3  . ondansetron (ZOFRAN) 4 MG tablet Take 1 tablet (4 mg total) by mouth every 8 (eight) hours as needed for nausea or vomiting. 20 tablet 0  . predniSONE (DELTASONE) 20 MG tablet Take 1 tablet (20 mg total) by mouth daily with breakfast. 5 tablet 0  . ranitidine (ZANTAC) 150 MG tablet Take 1 tablet (150 mg total) by mouth 2 (two) times daily as needed for heartburn (or abdominal pain).    . rosuvastatin (CRESTOR) 20 MG tablet Take 1 tablet (20 mg total) by mouth daily. 90 tablet 3   No current facility-administered medications for this visit.      Allergies:   Lipitor [atorvastatin]; Oxycodone hcl; Protonix [pantoprazole sodium]; Doxycycline; Hydrocodone; Metoprolol; Penicillin g; Tramadol; Trazodone and nefazodone; Zoloft [sertraline]; Augmentin [amoxicillin-pot clavulanate]; Codeine; Demerol; Ivp dye [iodinated diagnostic agents]; and Shellfish allergy   Social History:  The patient  reports that she quit smoking about 20 years ago. Her smoking use included Cigarettes. She has a 4.50 pack-year smoking history. She has never used smokeless tobacco. She reports that she does not drink alcohol or use drugs.   Family History:   family history includes Aneurysm in her brother and mother; Birth defects in her brother; Bleeding Disorder in her mother and sister; Cancer in her sister; Cirrhosis in her father; Deep vein thrombosis in her brother; Heart attack in her father; Heart disease in her brother, father, mother, and sister; Hyperlipidemia in her brother, daughter, father, mother, and sister; Hypertension in her brother, daughter, father, mother, other, and sister; Peripheral vascular disease in her daughter; Varicose Veins in her mother and sister.    Review of Systems: Review of Systems  Constitutional: Negative.   Respiratory:  Negative.   Cardiovascular: Negative.   Gastrointestinal: Negative.   Musculoskeletal: Positive for back pain.  Neurological: Negative.   Psychiatric/Behavioral: Negative.   All other systems reviewed and are negative.    PHYSICAL EXAM: VS:  BP 138/60 (BP Location: Left Arm, Patient Position: Sitting, Cuff Size: Normal)   Pulse (!) 56   Ht 5\' 2"  (1.575 m)   Wt 154 lb 8 oz (70.1 kg)   BMI 28.26 kg/m  , BMI Body mass index is 28.26 kg/m. GEN: Well nourished, well developed, in no acute distress  HEENT: normal  Neck: no JVD, carotid bruits, or masses Cardiac: RRR; no murmurs, rubs, or gallops,no edema  Respiratory:  clear to auscultation bilaterally, normal work of breathing GI: soft, nontender, nondistended, + BS MS: no deformity or atrophy  Skin: warm and dry, no rash Neuro:  Strength and sensation are intact Psych: euthymic mood, full affect    Recent Labs: 06/29/2016: ALT 23    Lipid Panel Lab Results  Component Value Date   CHOL  136 06/29/2016   HDL 57 06/29/2016   LDLCALC 55 06/29/2016   TRIG 120 06/29/2016      Wt Readings from Last 3 Encounters:  12/28/16 154 lb 8 oz (70.1 kg)  09/21/16 158 lb 8 oz (71.9 kg)  08/18/16 160 lb (72.6 kg)       ASSESSMENT AND PLAN:   Carotid stenosis, bilateral -  Stable disease bilaterally , results discussed with her in detail continue aggressive cholesterol management  Essential hypertension - Plan: EKG 12-Lead, Hepatic function panel, Lipid Profile Blood pressure is well controlled on today's visit. No changes made to the medications.  Hyperlipidemia, unspecified hyperlipidemia type -  Discussed the benefits of her low numbers Cholesterol is at goal on the current lipid regimen. No changes to the medications were made.  Atherosclerosis of coronary artery bypass graft of native heart without angina pectoris Currently with no anginal symptoms. Active with no complaints No further testing at this time   Total  encounter time more than 25 minutes  Greater than 50% was spent in counseling and coordination of care with the patient   Disposition:   F/U  6 months   Orders Placed This Encounter  Procedures  . EKG 12-Lead     Signed, Esmond Plants, M.D., Ph.D. 12/28/2016  [place], [place]

## 2016-12-28 ENCOUNTER — Ambulatory Visit (INDEPENDENT_AMBULATORY_CARE_PROVIDER_SITE_OTHER): Payer: Medicare HMO | Admitting: Cardiovascular Disease

## 2016-12-28 ENCOUNTER — Encounter: Payer: Self-pay | Admitting: Cardiovascular Disease

## 2016-12-28 VITALS — BP 138/60 | HR 56 | Ht 62.0 in | Wt 154.5 lb

## 2016-12-28 DIAGNOSIS — I25708 Atherosclerosis of coronary artery bypass graft(s), unspecified, with other forms of angina pectoris: Secondary | ICD-10-CM | POA: Diagnosis not present

## 2016-12-28 DIAGNOSIS — E782 Mixed hyperlipidemia: Secondary | ICD-10-CM

## 2016-12-28 DIAGNOSIS — I6523 Occlusion and stenosis of bilateral carotid arteries: Secondary | ICD-10-CM

## 2016-12-28 DIAGNOSIS — I10 Essential (primary) hypertension: Secondary | ICD-10-CM | POA: Diagnosis not present

## 2016-12-28 DIAGNOSIS — R0989 Other specified symptoms and signs involving the circulatory and respiratory systems: Secondary | ICD-10-CM | POA: Diagnosis not present

## 2016-12-28 NOTE — Patient Instructions (Addendum)
Medication Instructions:   No medication changes  Cetirazine/zyrtec for allergy  Labwork:  No new labs needed  Testing/Procedures:  No further testing at this time   I recommend watching educational videos on topics of interest to you at:       www.goemmi.com  Enter code: HEARTCARE    Follow-Up: It was a pleasure seeing you in the office today. Please call us if you have new issues that need to be addressed before your next appt.  [phone]  Your physician wants you to follow-up in: 6 months.  You will receive a reminder letter in the mail two months in advance. If you don't receive a letter, please call our office to schedule the follow-up appointment.  If you need a refill on your cardiac medications before your next appointment, please call your pharmacy.

## 2017-01-07 ENCOUNTER — Telehealth: Payer: Self-pay | Admitting: Radiology

## 2017-01-07 ENCOUNTER — Other Ambulatory Visit: Payer: Self-pay | Admitting: Family Medicine

## 2017-01-07 ENCOUNTER — Ambulatory Visit (INDEPENDENT_AMBULATORY_CARE_PROVIDER_SITE_OTHER): Payer: Medicare HMO

## 2017-01-07 VITALS — BP 132/70 | HR 62 | Temp 97.9°F | Ht 62.0 in | Wt 153.5 lb

## 2017-01-07 DIAGNOSIS — Z0000 Encounter for general adult medical examination without abnormal findings: Secondary | ICD-10-CM | POA: Diagnosis not present

## 2017-01-07 DIAGNOSIS — I10 Essential (primary) hypertension: Secondary | ICD-10-CM

## 2017-01-07 DIAGNOSIS — Z23 Encounter for immunization: Secondary | ICD-10-CM | POA: Diagnosis not present

## 2017-01-07 LAB — CBC WITH DIFFERENTIAL/PLATELET
Basophils Absolute: 0 10*3/uL (ref 0.0–0.1)
Basophils Relative: 0.4 % (ref 0.0–3.0)
Eosinophils Absolute: 0.1 10*3/uL (ref 0.0–0.7)
Eosinophils Relative: 0.8 % (ref 0.0–5.0)
HCT: 36.2 % (ref 36.0–46.0)
Hemoglobin: 12.4 g/dL (ref 12.0–15.0)
Lymphocytes Relative: 29.4 % (ref 12.0–46.0)
Lymphs Abs: 1.8 10*3/uL (ref 0.7–4.0)
MCHC: 34.2 g/dL (ref 30.0–36.0)
MCV: 80.3 fl (ref 78.0–100.0)
Monocytes Absolute: 0.4 10*3/uL (ref 0.1–1.0)
Monocytes Relative: 6.8 % (ref 3.0–12.0)
Neutro Abs: 3.9 10*3/uL (ref 1.4–7.7)
Neutrophils Relative %: 62.6 % (ref 43.0–77.0)
Platelets: 324 10*3/uL (ref 150.0–400.0)
RBC: 4.5 Mil/uL (ref 3.87–5.11)
RDW: 14.6 % (ref 11.5–15.5)
WBC: 6.2 10*3/uL (ref 4.0–10.5)

## 2017-01-07 LAB — COMPREHENSIVE METABOLIC PANEL
ALT: 16 U/L (ref 0–35)
AST: 20 U/L (ref 0–37)
Albumin: 4.3 g/dL (ref 3.5–5.2)
Alkaline Phosphatase: 78 U/L (ref 39–117)
BUN: 17 mg/dL (ref 6–23)
CO2: 37 mEq/L — ABNORMAL HIGH (ref 19–32)
Calcium: 9.8 mg/dL (ref 8.4–10.5)
Chloride: 92 mEq/L — ABNORMAL LOW (ref 96–112)
Creatinine, Ser: 0.9 mg/dL (ref 0.40–1.20)
GFR: 64.56 mL/min (ref >60.00)
Glucose, Bld: 112 mg/dL — ABNORMAL HIGH (ref 70–99)
Potassium: 2.8 mEq/L — CL (ref 3.5–5.1)
Sodium: 137 mEq/L (ref 135–145)
Total Bilirubin: 0.5 mg/dL (ref 0.2–1.2)
Total Protein: 6.8 g/dL (ref 6.0–8.3)

## 2017-01-07 LAB — LIPID PANEL
Cholesterol: 127 mg/dL (ref 0–200)
HDL: 54.7 mg/dL (ref >39.00)
LDL Cholesterol: 48 mg/dL (ref 0–99)
NonHDL: 72.22
Total CHOL/HDL Ratio: 2
Triglycerides: 119 mg/dL (ref 0.0–149.0)
VLDL: 23.8 mg/dL (ref 0.0–40.0)

## 2017-01-07 NOTE — Telephone Encounter (Signed)
Patient notified

## 2017-01-07 NOTE — Telephone Encounter (Signed)
Agreed, thanks

## 2017-01-07 NOTE — Progress Notes (Signed)
PCP notes:   Health maintenance:  PCV13 - administered  Abnormal screenings:   Hearing - failed Depression score: 15  Patient concerns:   Pt reports feeling sleepy all the time.   Nurse concerns:  None  Next PCP appt:   01/14/17 @ 1415

## 2017-01-07 NOTE — Progress Notes (Signed)
Subjective:   Catherine Green is a 76 y.o. female who presents for an Initial Medicare Annual Wellness Visit.  Review of Systems    N/A  Cardiac Risk Factors include: advanced age (>42men, >60 women);dyslipidemia;hypertension     Objective:    Today's Vitals   01/07/17 1050  BP: 132/70  Pulse: 62  Temp: 97.9 F (36.6 C)  TempSrc: Oral  SpO2: 94%  Weight: 153 lb 8 oz (69.6 kg)  Height: 5\' 2"  (1.575 m)  PainSc: 0-No pain   Body mass index is 28.08 kg/m.   Current Medications (verified) Outpatient Encounter Prescriptions as of 01/07/2017  Medication Sig  . ALPRAZolam (XANAX) 0.25 MG tablet TAKE 1 TABLET BY MOUTH EVERY DAY AS NEEDED FOR ANXIETY  . aspirin 81 MG tablet Take 162 mg by mouth daily.   . carvedilol (COREG) 3.125 MG tablet TAKE 1 TABLET BY MOUTH TWICE DAILY  . Coenzyme Q10 (COQ10 PO) Take by mouth daily.  . cyanocobalamin (V-R VITAMIN B-12) 500 MCG tablet Take 500 mcg by mouth as needed.  . ezetimibe (ZETIA) 10 MG tablet Take 1 tablet (10 mg total) by mouth daily.  . hydrochlorothiazide (HYDRODIURIL) 50 MG tablet TAKE 1/2 TABLET (50 MG TOTAL) BY MOUTH DAILY.  Marland Kitchen KLOR-CON M20 20 MEQ tablet TAKE 1 TABLET BY MOUTH EVERY DAY WITH LASIX  . ranitidine (ZANTAC) 150 MG tablet Take 1 tablet (150 mg total) by mouth 2 (two) times daily as needed for heartburn (or abdominal pain).  . rosuvastatin (CRESTOR) 20 MG tablet Take 1 tablet (20 mg total) by mouth daily.  Marland Kitchen guaiFENesin (MUCINEX) 600 MG 12 hr tablet Take 600 mg by mouth daily.  . [DISCONTINUED] clarithromycin (BIAXIN) 500 MG tablet Take 1 tablet (500 mg total) by mouth 2 (two) times daily.  . [DISCONTINUED] ondansetron (ZOFRAN) 4 MG tablet Take 1 tablet (4 mg total) by mouth every 8 (eight) hours as needed for nausea or vomiting.  . [DISCONTINUED] predniSONE (DELTASONE) 20 MG tablet Take 1 tablet (20 mg total) by mouth daily with breakfast.   No facility-administered encounter medications on file as of 01/07/2017.      Allergies (verified) Lipitor [atorvastatin]; Oxycodone hcl; Protonix [pantoprazole sodium]; Doxycycline; Hydrocodone; Metoprolol; Penicillin g; Tramadol; Trazodone and nefazodone; Zoloft [sertraline]; Augmentin [amoxicillin-pot clavulanate]; Codeine; Demerol; Ivp dye [iodinated diagnostic agents]; and Shellfish allergy   History: Past Medical History:  Diagnosis Date  . Allergy    hay fever  . Anemia   . Anxiety   . Carotid artery occlusion   . Cerebrovascular disease    extracranial; occlusive  . Chicken pox   . Coronary artery disease   . Depression   . Dizziness   . Dizziness   . DVT (deep venous thrombosis) ([place])   . Fainting spell   . GERD (gastroesophageal reflux disease)   . Headache   . Heart murmur   . Hyperlipidemia   . Hypertension   . PONV (postoperative nausea and vomiting)    severe nausea and vomiting  . Pre-syncope   . PVD (peripheral vascular disease) ([place])    endarterectomy by [name]  . Seasonal allergies   . Shortness of breath dyspnea    wth ambulation at times  . Swelling of both ankles    and abdomen; takes Lasix when needed  . Thrombophlebitis    following childbirth  . Ulcer    Past Surgical History:  Procedure Laterality Date  . APPENDECTOMY    . BACK SURGERY  01/14/16  .  BREAST SURGERY     saline implants  . CARDIAC CATHETERIZATION    . CAROTID ENDARTERECTOMY  1992  . CAROTID ENDARTERECTOMY Right February 13, 2005   Re-do Right CE  . CATARACT EXTRACTION, BILATERAL  2013  . CHOLECYSTECTOMY, LAPAROSCOPIC  04/02/14   Dr. Rochel Brome  . CORONARY ARTERY BYPASS GRAFT  2006   x3 Dr. Prescott Gum  . ENDARTERECTOMY Left 07/20/2014   Procedure: ENDARTERECTOMY CAROTID-LEFT;  Surgeon: Rosetta Posner, MD;  Location: [place];  Service: Vascular;  Laterality: Left;  . EYE SURGERY Bilateral Feb. 2013   Cataract Left eye  . SEPTOPLASTY     with bilateral inferior turbinate reductions  . SPINE SURGERY  12/2015  . TOTAL ABDOMINAL HYSTERECTOMY    .  UPPER GASTROINTESTINAL ENDOSCOPY     had polyps removed from esophagus  . UPPER GI ENDOSCOPY     Family History  Problem Relation Age of Onset  . Aneurysm Mother        brain  . Heart disease Mother        Aneyursm   . Hyperlipidemia Mother   . Hypertension Mother   . Varicose Veins Mother   . Bleeding Disorder Mother   . Heart disease Father   . Cirrhosis Father   . Heart attack Father   . Hyperlipidemia Father   . Hypertension Father   . Heart disease Brother        Before age 1  . Aneurysm Brother   . Deep vein thrombosis Brother   . Birth defects Brother   . Hyperlipidemia Brother   . Hypertension Brother   . Hypertension Other   . Cancer Sister        lung  . Heart disease Sister        Aneurysm  . Hyperlipidemia Sister   . Hypertension Sister   . Varicose Veins Sister   . Peripheral vascular disease Daughter   . Hyperlipidemia Daughter   . Hypertension Daughter   . Bleeding Disorder Sister   . Breast cancer Neg Hx   . Colon cancer Neg Hx    Social History   Occupational History  . Not on file.   Social History Main Topics  . Smoking status: Former Smoker    Packs/day: 0.30    Years: 15.00    Types: Cigarettes    Quit date: 08/31/1996  . Smokeless tobacco: Never Used  . Alcohol use No  . Drug use: No  . Sexual activity: Not on file    Tobacco Counseling Counseling given: No   Activities of Daily Living In your present state of health, do you have any difficulty performing the following activities: 01/07/2017  Hearing? Y  Vision? N  Difficulty concentrating or making decisions? N  Walking or climbing stairs? N  Dressing or bathing? N  Doing errands, shopping? N  Preparing Food and eating ? N  Using the Toilet? N  In the past six months, have you accidently leaked urine? N  Do you have problems with loss of bowel control? N  Managing your Medications? N  Managing your Finances? N  Housekeeping or managing your Housekeeping? N  Some recent  data might be hidden    Immunizations and Health Maintenance Immunization History  Administered Date(s) Administered  . Influenza Split 05/27/2012  . Influenza Whole 06/20/2013  . Influenza, Seasonal, Injecte, Preservative Fre 06/24/2016  . Influenza,inj,Quad PF,36+ Mos 06/14/2014, 06/07/2015  . Pneumococcal Conjugate-13 01/07/2017  . Pneumococcal Polysaccharide-23 03/11/2011  . Tdap  05/27/2012  . Zoster 03/11/2011   Health Maintenance Due  Topic Date Due  . DEXA SCAN  03/12/2005  . MAMMOGRAM  03/10/2012    Patient Care Team: Tonia Ghent, MD as PCP - General (Family Medicine) Minna Merritts, MD (Cardiology) Earnie Larsson, MD as Consulting Physician (Neurosurgery)     Assessment:   This is a routine wellness visit.   Hearing/Vision screen  Hearing Screening   125Hz  250Hz  500Hz  1000Hz  2000Hz  3000Hz  4000Hz  6000Hz  8000Hz   Right ear:   0 0 0  0    Left ear:   0 0 0  0    Vision Screening Comments: Last vision exam in Dec 2017  Dietary issues and exercise activities discussed: Current Exercise Habits: The patient does not participate in regular exercise at present, Exercise limited by: orthopedic condition(s)  Goals    . Increase water intake          Starting 01/07/2017, I will continue to drink at least 6-8 glasses of water daily.       Depression Screen PHQ 2/9 Scores 01/07/2017  PHQ - 2 Score 6  PHQ- 9 Score 15    Fall Risk Fall Risk  01/07/2017  Falls in the past year? No    Cognitive Function: MMSE - Mini Mental State Exam 01/07/2017  Orientation to time 5  Orientation to Place 5  Registration 3  Attention/ Calculation 0  Recall 3  Language- name 2 objects 0  Language- repeat 1  Language- follow 3 step command 3  Language- read & follow direction 0  Write a sentence 0  Copy design 0  Total score 20     PLEASE NOTE: A Mini-Cog screen was completed. Maximum score is 20. A value of 0 denotes this part of Folstein MMSE was not completed or the  patient failed this part of the Mini-Cog screening.   Mini-Cog Screening Orientation to Time - Max 5 pts Orientation to Place - Max 5 pts Registration - Max 3 pts Recall - Max 3 pts Language Repeat - Max 1 pts Language Follow 3 Step Command - Max 3 pts'    Screening Tests Health Maintenance  Topic Date Due  . DEXA SCAN  03/12/2005  . MAMMOGRAM  03/10/2012  . INFLUENZA VACCINE  03/31/2017  . TETANUS/TDAP  05/27/2022  . PNA vac Low Risk Adult  Completed      Plan:     I have personally reviewed and addressed the Medicare Annual Wellness questionnaire and have noted the following in the patient's chart:  A. Medical and social history B. Use of alcohol, tobacco or illicit drugs  C. Current medications and supplements D. Functional ability and status E.  Nutritional status F.  Physical activity G. Advance directives H. List of other physicians I.  Hospitalizations, surgeries, and ER visits in previous 12 months J.  [place] to include hearing, vision, cognitive, depression L. Referrals and appointments - none  In addition, I have reviewed and discussed with patient certain preventive protocols, quality metrics, and best practice recommendations. A written personalized care plan for preventive services as well as general preventive health recommendations were provided to patient.  See attached scanned questionnaire for additional information.   Signed,   Lindell Noe, MHA, BS, LPN Health Coach

## 2017-01-07 NOTE — Patient Instructions (Signed)
Catherine Green , Thank you for taking time to come for your Medicare Wellness Visit. I appreciate your ongoing commitment to your health goals. Please review the following plan we discussed and let me know if I can assist you in the future.   These are the goals we discussed: Goals    . Increase water intake          Starting 01/07/2017, I will continue to drink at least 6-8 glasses of water daily.        This is a list of the screening recommended for you and due dates:  Health Maintenance  Topic Date Due  . Mammogram  10/02/2020*  . DEXA scan (bone density measurement)  01/08/2027*  . Flu Shot  03/31/2017  . Tetanus Vaccine  05/27/2022  . Pneumonia vaccines  Completed  *Topic was postponed. The date shown is not the original due date.   Preventive Care for Adults  A healthy lifestyle and preventive care can promote health and wellness. Preventive health guidelines for adults include the following key practices.  . A routine yearly physical is a good way to check with your health care provider about your health and preventive screening. It is a chance to share any concerns and updates on your health and to receive a thorough exam.  . Visit your dentist for a routine exam and preventive care every 6 months. Brush your teeth twice a day and floss once a day. Good oral hygiene prevents tooth decay and gum disease.  . The frequency of eye exams is based on your age, health, family medical history, use  of contact lenses, and other factors. Follow your health care provider's ecommendations for frequency of eye exams.  . Eat a healthy diet. Foods like vegetables, fruits, whole grains, low-fat dairy products, and lean protein foods contain the nutrients you need without too many calories. Decrease your intake of foods high in solid fats, added sugars, and salt. Eat the right amount of calories for you. Get information about a proper diet from your health care provider, if necessary.  .  Regular physical exercise is one of the most important things you can do for your health. Most adults should get at least 150 minutes of moderate-intensity exercise (any activity that increases your heart rate and causes you to sweat) each week. In addition, most adults need muscle-strengthening exercises on 2 or more days a week.  Silver Sneakers may be a benefit available to you. To determine eligibility, you may visit the website: www.silversneakers.com or contact program at (603)474-9183 Mon-Fri between 8AM-8PM.   . Maintain a healthy weight. The body mass index (BMI) is a screening tool to identify possible weight problems. It provides an estimate of body fat based on height and weight. Your health care provider can find your BMI and can help you achieve or maintain a healthy weight.   For adults 20 years and older: ? A BMI below 18.5 is considered underweight. ? A BMI of 18.5 to 24.9 is normal. ? A BMI of 25 to 29.9 is considered overweight. ? A BMI of 30 and above is considered obese.   . Maintain normal blood lipids and cholesterol levels by exercising and minimizing your intake of saturated fat. Eat a balanced diet with plenty of fruit and vegetables. Blood tests for lipids and cholesterol should begin at age 9 and be repeated every 5 years. If your lipid or cholesterol levels are high, you are over 50, or you are at high  risk for heart disease, you may need your cholesterol levels checked more frequently. Ongoing high lipid and cholesterol levels should be treated with medicines if diet and exercise are not working.  . If you smoke, find out from your health care provider how to quit. If you do not use tobacco, please do not start.  . If you choose to drink alcohol, please do not consume more than 2 drinks per day. One drink is considered to be 12 ounces (355 mL) of beer, 5 ounces (148 mL) of wine, or 1.5 ounces (44 mL) of liquor.  . If you are 7-63 years old, ask your health care  provider if you should take aspirin to prevent strokes.  . Use sunscreen. Apply sunscreen liberally and repeatedly throughout the day. You should seek shade when your shadow is shorter than you. Protect yourself by wearing long sleeves, pants, a wide-brimmed hat, and sunglasses year round, whenever you are outdoors.  . Once a month, do a whole body skin exam, using a mirror to look at the skin on your back. Tell your health care provider of new moles, moles that have irregular borders, moles that are larger than a pencil eraser, or moles that have changed in shape or color.

## 2017-01-07 NOTE — Telephone Encounter (Signed)
[facility] called critical results, K+ 2.8. Results given to Dr Danise Mina

## 2017-01-07 NOTE — Telephone Encounter (Signed)
Dr Damita Dunnings pt Plz call patient - potassium returned very low at 2.8 (goal is >3.5) Recommend increase Kdur to 35mEq twice daily until she sees Dr Damita Dunnings next week, I have sent higher dose to pharmacy. She should also try to increase fruits and vegetables in diet over the next week.

## 2017-01-07 NOTE — Progress Notes (Signed)
Pre visit review using our clinic review tool, if applicable. No additional management support is needed unless otherwise documented below in the visit note. 

## 2017-01-08 MED ORDER — POTASSIUM CHLORIDE CRYS ER 20 MEQ PO TBCR: EXTENDED_RELEASE_TABLET | ORAL | 0 refills | Status: DC

## 2017-01-08 NOTE — Addendum Note (Signed)
Addended by: Helene Shoe on: 01/08/2017 11:25 AM   Modules accepted: Orders

## 2017-01-08 NOTE — Telephone Encounter (Addendum)
kdur did not go to pharmacy per pts husband; called [facility] [address] spoke with Annie Main and gave KDur 20 meq taking one tab bid until sees Dr Damita Dunnings on 01/14/17; qty # 30 x 0. Mr Kersting will pick up at pharmacy.

## 2017-01-14 ENCOUNTER — Ambulatory Visit (INDEPENDENT_AMBULATORY_CARE_PROVIDER_SITE_OTHER): Payer: Medicare HMO | Admitting: Family Medicine

## 2017-01-14 ENCOUNTER — Encounter: Payer: Self-pay | Admitting: Family Medicine

## 2017-01-14 VITALS — BP 146/60 | HR 57 | Temp 97.6°F | Ht 62.0 in | Wt 154.0 lb

## 2017-01-14 DIAGNOSIS — M79605 Pain in left leg: Secondary | ICD-10-CM

## 2017-01-14 DIAGNOSIS — I10 Essential (primary) hypertension: Secondary | ICD-10-CM

## 2017-01-14 DIAGNOSIS — M48061 Spinal stenosis, lumbar region without neurogenic claudication: Secondary | ICD-10-CM | POA: Diagnosis not present

## 2017-01-14 DIAGNOSIS — L989 Disorder of the skin and subcutaneous tissue, unspecified: Secondary | ICD-10-CM | POA: Diagnosis not present

## 2017-01-14 DIAGNOSIS — E876 Hypokalemia: Secondary | ICD-10-CM

## 2017-01-14 DIAGNOSIS — E782 Mixed hyperlipidemia: Secondary | ICD-10-CM | POA: Diagnosis not present

## 2017-01-14 DIAGNOSIS — I6523 Occlusion and stenosis of bilateral carotid arteries: Secondary | ICD-10-CM | POA: Diagnosis not present

## 2017-01-14 DIAGNOSIS — M79604 Pain in right leg: Secondary | ICD-10-CM | POA: Diagnosis not present

## 2017-01-14 DIAGNOSIS — R5383 Other fatigue: Secondary | ICD-10-CM

## 2017-01-14 LAB — BASIC METABOLIC PANEL
BUN: 18 mg/dL (ref 6–23)
CO2: 33 mEq/L — ABNORMAL HIGH (ref 19–32)
Calcium: 9.7 mg/dL (ref 8.4–10.5)
Chloride: 98 mEq/L (ref 96–112)
Creatinine, Ser: 0.81 mg/dL (ref 0.40–1.20)
GFR: 72.9 mL/min (ref >60.00)
Glucose, Bld: 98 mg/dL (ref 70–99)
Potassium: 3.9 mEq/L (ref 3.5–5.1)
Sodium: 137 mEq/L (ref 135–145)

## 2017-01-14 LAB — TSH: TSH: 1.11 u[IU]/mL (ref 0.35–4.50)

## 2017-01-14 NOTE — Patient Instructions (Signed)
Go to the lab on the way out.  We'll contact you with your lab report. Stop HCTZ for now.  Update me if you have more swelling.   We'll update you about your potassium dose.  I wouldn't change your other meds for now.  Take care.  Glad to see you.

## 2017-01-14 NOTE — Progress Notes (Signed)
Hearing - failed, declined hearing aids at this point.   Depression score: 15. Mood d/w pt.  "I really am down a lot."  Likely exacerbated by pain- she thinks this is likely contributing.  She is more withdrawn.  Less talkative.  Pt reports feeling sleepy frequently.  No SI/HI.  She is having more  Trouble concentrating in general.    Her back pain continues in spite of surgery last year.  No pain sitting but pain with getting up and moving.  She has pain within a few steps after getting up and weight bearing.    Elevated Cholesterol: Using medications without problems: yes Muscle aches: not from statin.   Diet compliance: encouraged.   Exercise:limited   Hypertension:    Using medication without problems or lightheadedness: occ lightheaded Chest pain with exertion: no Edema: minimal at the end of the day, resolved by next AM.   Short of breath:no Other issues: Low K, due for repeat labs.  She feels some better in general with the higher dose of K.    PMH and SH reviewed  ROS: Per HPI unless specifically indicated in ROS section   Meds, vitals, and allergies reviewed.   GEN: nad, alert and oriented HEENT: mucous membranes moist NECK: supple w/o LA, R carotid bruit noted.  CV: rrr.  no murmur PULM: ctab, no inc wob ABD: soft, +bs EXT: no edema SKIN: no acute rash Mult lipomas noted on the arms.   She has some scaly lesions noted on the R leg, small (<1cm each) but tx/derm referral was deferred until the K issues were resolved, d/w pt.  She agrees.

## 2017-01-15 DIAGNOSIS — R5383 Other fatigue: Secondary | ICD-10-CM | POA: Insufficient documentation

## 2017-01-15 MED ORDER — POTASSIUM CHLORIDE CRYS ER 20 MEQ PO TBCR: 20.0000 meq | EXTENDED_RELEASE_TABLET | Freq: Every day | ORAL | Status: DC

## 2017-01-15 NOTE — Assessment & Plan Note (Signed)
Her diastolic is not high at all and she has had significant hypokalemia recently. Both could be related to hydrochlorothiazide. Stop chlorothiazide for now and she will update me if her swelling is worse. Recheck potassium today. See notes on labs.

## 2017-01-15 NOTE — Assessment & Plan Note (Signed)
Lipids are reasonable. Continue statin for now.

## 2017-01-15 NOTE — Addendum Note (Signed)
Addended by: Ellamae Sia on: 01/15/2017 09:14 AM   Modules accepted: Orders

## 2017-01-15 NOTE — Assessment & Plan Note (Signed)
Unclear how much this is exacerbated by pain. Unclear if she has true depression or depression-like symptoms to to sleep disruption, fatigue, pain, etc. differential discussed with patient. She understood. Check TSH today. At this point still okay for outpatient follow-up. >25 minutes spent in face to face time with patient, >50% spent in counselling or coordination of care.

## 2017-01-15 NOTE — Assessment & Plan Note (Signed)
See above

## 2017-01-15 NOTE — Assessment & Plan Note (Signed)
This appears to be coming from her back, similar to previous symptoms. She has symptoms typical for spinal stenosis. We may end up needing to get input from Dr. Annette Stable again but in the meantime we need to address her potassium and blood pressure. She agrees.

## 2017-01-15 NOTE — Assessment & Plan Note (Signed)
No ulcerated lesions concerning for skin cancer. Defer dermatology eval for now, until other issues that are more pressing are address. She agrees.

## 2017-01-15 NOTE — Assessment & Plan Note (Signed)
Continue statin. She has had regular carotid follow-up imaging.

## 2017-01-18 ENCOUNTER — Telehealth: Payer: Self-pay

## 2017-01-18 MED ORDER — POTASSIUM CHLORIDE CRYS ER 20 MEQ PO TBCR: 40.0000 meq | EXTENDED_RELEASE_TABLET | Freq: Every day | ORAL | Status: DC

## 2017-01-18 MED ORDER — HYDROCHLOROTHIAZIDE 50 MG PO TABS: ORAL_TABLET | ORAL | Status: DC

## 2017-01-18 NOTE — Telephone Encounter (Signed)
Patient advised.

## 2017-01-18 NOTE — Telephone Encounter (Signed)
If she has to take HCTZ, then take 2 of the potassium pills daily also.  Med list update.   Would still get f/u labs when possible.  Thanks.

## 2017-01-18 NOTE — Telephone Encounter (Signed)
Pt left v/m; swelling restarted after stopping fluid pill; pt restarted fluid pill. Pt request cb with further instructions.

## 2017-01-19 ENCOUNTER — Other Ambulatory Visit: Payer: Self-pay | Admitting: Family Medicine

## 2017-01-19 NOTE — Progress Notes (Signed)
I reviewed health advisor's note, was available for consultation, and agree with documentation and plan.   Signed,  Luverta Korte T. Kyoko Elsea, MD  

## 2017-01-20 NOTE — Telephone Encounter (Signed)
Sent.  Thanks.   For charting purposes- if she has to take HCTZ, then take 2 of the potassium pills daily also.

## 2017-01-20 NOTE — Telephone Encounter (Signed)
Left message on answering machine at home number to call back. 

## 2017-01-20 NOTE — Telephone Encounter (Signed)
Left detailed message (DPR)  on home answering machine regarding medication and how to take it per Dr. Damita Dunnings.

## 2017-01-26 ENCOUNTER — Other Ambulatory Visit (INDEPENDENT_AMBULATORY_CARE_PROVIDER_SITE_OTHER): Payer: Medicare HMO

## 2017-01-26 DIAGNOSIS — E876 Hypokalemia: Secondary | ICD-10-CM | POA: Diagnosis not present

## 2017-01-26 LAB — BASIC METABOLIC PANEL
BUN: 13 mg/dL (ref 6–23)
CO2: 32 mEq/L (ref 19–32)
Calcium: 10 mg/dL (ref 8.4–10.5)
Chloride: 99 mEq/L (ref 96–112)
Creatinine, Ser: 0.81 mg/dL (ref 0.40–1.20)
GFR: 72.9 mL/min (ref >60.00)
Glucose, Bld: 118 mg/dL — ABNORMAL HIGH (ref 70–99)
Potassium: 4.4 mEq/L (ref 3.5–5.1)
Sodium: 139 mEq/L (ref 135–145)

## 2017-01-29 DIAGNOSIS — M47816 Spondylosis without myelopathy or radiculopathy, lumbar region: Secondary | ICD-10-CM | POA: Diagnosis not present

## 2017-02-02 ENCOUNTER — Other Ambulatory Visit: Payer: Self-pay | Admitting: Neurosurgery

## 2017-02-02 DIAGNOSIS — M47816 Spondylosis without myelopathy or radiculopathy, lumbar region: Secondary | ICD-10-CM

## 2017-02-08 ENCOUNTER — Encounter: Payer: Self-pay | Admitting: Family

## 2017-02-11 ENCOUNTER — Other Ambulatory Visit: Payer: Self-pay | Admitting: Cardiovascular Disease

## 2017-02-12 ENCOUNTER — Ambulatory Visit
Admission: RE | Admit: 2017-02-12 | Discharge: 2017-02-12 | Disposition: A | Discharge status: Home / Self Care | Payer: Medicare HMO | Source: Ambulatory Visit | Attending: Neurosurgery | Admitting: Neurosurgery

## 2017-02-12 DIAGNOSIS — M48061 Spinal stenosis, lumbar region without neurogenic claudication: Secondary | ICD-10-CM | POA: Diagnosis not present

## 2017-02-12 DIAGNOSIS — M47816 Spondylosis without myelopathy or radiculopathy, lumbar region: Secondary | ICD-10-CM

## 2017-02-12 MED ORDER — GADOBENATE DIMEGLUMINE 529 MG/ML IV SOLN
14.0000 mL | Freq: Once | INTRAVENOUS | Status: AC | PRN
  Administered 2017-02-12: 14 mL via INTRAVENOUS

## 2017-02-17 ENCOUNTER — Ambulatory Visit (INDEPENDENT_AMBULATORY_CARE_PROVIDER_SITE_OTHER): Payer: Medicare HMO | Admitting: Family

## 2017-02-17 ENCOUNTER — Encounter: Payer: Self-pay | Admitting: Family

## 2017-02-17 ENCOUNTER — Ambulatory Visit (HOSPITAL_COMMUNITY)
Admission: RE | Admit: 2017-02-17 | Discharge: 2017-02-17 | Disposition: A | Discharge status: Home / Self Care | Payer: Medicare HMO | Source: Ambulatory Visit | Attending: Vascular Surgery | Admitting: Vascular Surgery

## 2017-02-17 VITALS — BP 164/76 | HR 60 | Temp 97.7°F | Resp 16 | Ht 62.0 in | Wt 157.6 lb

## 2017-02-17 DIAGNOSIS — Z9889 Other specified postprocedural states: Secondary | ICD-10-CM | POA: Diagnosis not present

## 2017-02-17 DIAGNOSIS — I6523 Occlusion and stenosis of bilateral carotid arteries: Secondary | ICD-10-CM | POA: Diagnosis not present

## 2017-02-17 LAB — VAS US CAROTID
LEFT ECA DIAS: -4 cm/s
LEFT VERTEBRAL DIAS: -11 cm/s
Left CCA dist dias: 19 cm/s
Left CCA dist sys: 126 cm/s
Left CCA prox dias: 19 cm/s
Left CCA prox sys: 109 cm/s
Left ICA dist dias: -31 cm/s
Left ICA dist sys: -125 cm/s
Left ICA prox dias: -43 cm/s
Left ICA prox sys: -217 cm/s
RIGHT CCA MID DIAS: 21 cm/s
RIGHT VERTEBRAL DIAS: 12 cm/s
Right CCA prox dias: 18 cm/s
Right CCA prox sys: 99 cm/s
Right cca dist sys: -105 cm/s

## 2017-02-17 NOTE — Progress Notes (Signed)
Chief Complaint: Follow up Extracranial Carotid Artery Stenosis   History of Present Illness  Catherine Green is a 76 y.o. female who is s/p right endarterectomy in 1992 and a redo in 2006. She also underwent left endarterectomy in 2015 by [name].   She has undergone back surgery for degenerative disc disease; states she has more back problems, states she will be seeing [name].   She denies any known history of stroke or TIA. Specifically she denies a history of amaurosis fugax or monocular blindness, unilateral facial drooping, hemiplegia, or receptive or expressive aphasia.    She reports significant recent fatigue, states she has discussed this with her PCP.   [name] last evaluated pt on 02-18-16. At that time carotid duplex demonstrated left internal carotid artery stenosis in the 40-59% whereas her last data interpretation was in the 60-79% range. Right endarterectomy site showed no evidence of stenosis. [name] advised her to follow up in 6 months for repeat carotid duplex.  Pt states he blood pressure at home "stays low, but shoots up in a doctor's office".   Pt Diabetic: no Pt smoker: former smoker, quit about 1998, started in her 20's  Pt meds include: Statin : yes ASA: yes Other anticoagulants/antiplatelets: no   Past Medical History:  Diagnosis Date  . Allergy    hay fever  . Anemia   . Anxiety   . Carotid artery occlusion   . Cerebrovascular disease    extracranial; occlusive  . Chicken pox   . Coronary artery disease   . Depression   . Dizziness   . Dizziness   . DVT (deep venous thrombosis) ([place])   . Fainting spell   . GERD (gastroesophageal reflux disease)   . Headache   . Heart murmur   . Hyperlipidemia   . Hypertension   . PONV (postoperative nausea and vomiting)    severe nausea and vomiting  . Pre-syncope   . PVD (peripheral vascular disease) ([place])    endarterectomy by [name]  . Seasonal allergies   . Shortness of breath dyspnea     wth ambulation at times  . Swelling of both ankles    and abdomen; takes Lasix when needed  . Thrombophlebitis    following childbirth  . Ulcer     Social History Social History  Substance Use Topics  . Smoking status: Former Smoker    Packs/day: 0.30    Years: 15.00    Types: Cigarettes    Quit date: 08/31/1996  . Smokeless tobacco: Never Used  . Alcohol use No    Family History Family History  Problem Relation Age of Onset  . Aneurysm Mother        brain  . Heart disease Mother        Aneyursm   . Hyperlipidemia Mother   . Hypertension Mother   . Varicose Veins Mother   . Bleeding Disorder Mother   . Heart disease Father   . Cirrhosis Father   . Heart attack Father   . Hyperlipidemia Father   . Hypertension Father   . Heart disease Brother        Before age 17  . Aneurysm Brother   . Deep vein thrombosis Brother   . Birth defects Brother   . Hyperlipidemia Brother   . Hypertension Brother   . Hypertension Other   . Cancer Sister        lung  . Heart disease Sister  Aneurysm  . Hyperlipidemia Sister   . Hypertension Sister   . Varicose Veins Sister   . Peripheral vascular disease Daughter   . Hyperlipidemia Daughter   . Hypertension Daughter   . Bleeding Disorder Sister   . Breast cancer Neg Hx   . Colon cancer Neg Hx     Surgical History Past Surgical History:  Procedure Laterality Date  . APPENDECTOMY    . BACK SURGERY  01/14/16  . BREAST SURGERY     saline implants  . CARDIAC CATHETERIZATION    . CAROTID ENDARTERECTOMY  1992  . CAROTID ENDARTERECTOMY Right February 13, 2005   Re-do Right CE  . CATARACT EXTRACTION, BILATERAL  2013  . CHOLECYSTECTOMY, LAPAROSCOPIC  04/02/14   Dr. Rochel Brome  . CORONARY ARTERY BYPASS GRAFT  2006   x3 Dr. Prescott Gum  . ENDARTERECTOMY Left 07/20/2014   Procedure: ENDARTERECTOMY CAROTID-LEFT;  Surgeon: Rosetta Posner, MD;  Location: [facility];  Service: Vascular;  Laterality: Left;  . EYE SURGERY Bilateral Feb.  2013   Cataract Left eye  . SEPTOPLASTY     with bilateral inferior turbinate reductions  . SPINE SURGERY  12/2015  . TOTAL ABDOMINAL HYSTERECTOMY    . UPPER GASTROINTESTINAL ENDOSCOPY     had polyps removed from esophagus  . UPPER GI ENDOSCOPY      Allergies  Allergen Reactions  . Lipitor [Atorvastatin] Nausea And Vomiting  . Oxycodone Hcl Other (See Comments)    Hallucinations   . Protonix [Pantoprazole Sodium] Nausea Only  . Doxycycline Other (See Comments)    weakness  . Hydrocodone Other (See Comments)    hallucinate  . Metoprolol Other (See Comments)    Slowed body down, per pt   . Penicillin G Nausea Only and Other (See Comments)    N&V  . Tramadol Other (See Comments)    Sedation   . Trazodone And Nefazodone Other (See Comments)    Sedation   . Zoloft [Sertraline] Other (See Comments)    Excessive sweating  . Augmentin [Amoxicillin-Pot Clavulanate] Nausea And Vomiting  . Codeine Nausea And Vomiting and Other (See Comments)  . Demerol Nausea And Vomiting  . Ivp Dye [Iodinated Diagnostic Agents] Hives and Itching       . Shellfish Allergy Hives and Itching    Current Outpatient Prescriptions  Medication Sig Dispense Refill  . ALPRAZolam (XANAX) 0.25 MG tablet TAKE 1 TABLET BY MOUTH EVERY DAY AS NEEDED FOR ANXIETY 30 tablet 2  . aspirin 81 MG tablet Take 162 mg by mouth daily.     . carvedilol (COREG) 3.125 MG tablet TAKE 1 TABLET BY MOUTH TWICE A DAY 180 tablet 3  . Coenzyme Q10 (COQ10 PO) Take by mouth daily.    . cyanocobalamin (V-R VITAMIN B-12) 500 MCG tablet Take 500 mcg by mouth as needed.    . ezetimibe (ZETIA) 10 MG tablet Take 1 tablet (10 mg total) by mouth daily. 90 tablet 3  . guaiFENesin (MUCINEX) 600 MG 12 hr tablet Take 600 mg by mouth daily.    . hydrochlorothiazide (HYDRODIURIL) 50 MG tablet TAKE 1/2 TABLET (50 MG TOTAL) BY MOUTH DAILY.    Marland Kitchen potassium chloride SA (KLOR-CON M20) 20 MEQ tablet Take 2 tablets (40 mEq total) by mouth daily. When  taking hydrochlorothiazide 60 tablet 5  . ranitidine (ZANTAC) 150 MG tablet Take 1 tablet (150 mg total) by mouth 2 (two) times daily as needed for heartburn (or abdominal pain).    . rosuvastatin (CRESTOR)  20 MG tablet Take 1 tablet (20 mg total) by mouth daily. 90 tablet 3   No current facility-administered medications for this visit.     Review of Systems : See HPI for pertinent positives and negatives.  Physical Examination  Vitals:   02/17/17 1406 02/17/17 1410  BP: (!) 162/70 (!) 164/76  Pulse: 60   Resp: 16   Temp: 97.7 F (36.5 C)   TempSrc: Oral   SpO2: 98%   Weight: 157 lb 9.6 oz (71.5 kg)   Height: 5\' 2"  (1.575 m)    Body mass index is 28.83 kg/m.  General: WDWN female in NAD GAIT: normal Eyes: PERRLA Pulmonary:  Respirations are non-labored, good air movement, CTAB,  No rales,  honchi, or wheezing.  Cardiac: regular rhythm, +murmur.  VASCULAR EXAM Carotid Bruits Right Left   Positive Positive     Abdominal aortic pulse is not palpable. Radial pulses are 2+ palpable and equal.                                                                                                                            LE Pulses Right Left       POPLITEAL  not palpable   not palpable       POSTERIOR TIBIAL  1+ palpable   2+ palpable        DORSALIS PEDIS      ANTERIOR TIBIAL 2+ palpable  2+ palpable     Gastrointestinal: soft, nontender, BS WNL, no r/g, no palpable masses.  Musculoskeletal: No muscle atrophy/wasting. M/S 5/5 throughout, extremities without ischemic changes.  Neurologic:  A&O X 3; appropriate affect, sensation is normal; speech is normal, CN 2-12 intact, pain and light touch intact in extremities, motor exam as listed above.    Assessment: HAIVYN ORAVEC is a 76 y.o. female who is s/p right endarterectomy in 1992 and a redo in 2006. She also underwent left endarterectomy in 2015.  She has no history of stroke or TIA.   Her atherosclerotic risk  factors include 27 years smoking history, quit in 1998. Fortunately she does not have DM. Her walking is limited by exacerbation of lumbar spine issues and pain.   DATA Carotid Duplex (02/17/17): Right ICA: 1-39% stenosis (CEA site) Left ICA: 40-59% stenosis (CEA site) No significant stenosis of the left ECA or bilateral CCA. Known occlusion of the right ECA origin with reconstitution via collaterals.  Bilateral vertebral artery flow is antegrade.  Bilateral subclavian artery waveforms are normal.  No significant change compared to the last exam on 08-18-16.   She had normal ABI's in 2016 with bi and triphasic waveforms.   Plan: Follow-up in 1 year with Carotid Duplex scan.   I discussed in depth with the patient the nature of atherosclerosis, and emphasized the importance of maximal medical management including strict control of blood pressure, blood glucose, and lipid levels, obtaining regular exercise, and continued cessation of smoking.  The patient is aware  that without maximal medical management the underlying atherosclerotic disease process will progress, limiting the benefit of any interventions. The patient was given information about stroke prevention and what symptoms should prompt the patient to seek immediate medical care. Thank you for allowing [place] to participate in this patient's care.  Clemon Chambers, RN, MSN, FNP-C [facility] Office: [phone]  Clinic Physician: Donzetta Matters  02/17/17 2:19 PM

## 2017-02-17 NOTE — Patient Instructions (Signed)
Stroke Prevention Some medical conditions and behaviors are associated with an increased chance of having a stroke. You may prevent a stroke by making healthy choices and managing medical conditions. How can I reduce my risk of having a stroke?  Stay physically active. Get at least 30 minutes of activity on most or all days.  Do not smoke. It may also be helpful to avoid exposure to secondhand smoke.  Limit alcohol use. Moderate alcohol use is considered to be: ? No more than 2 drinks per day for men. ? No more than 1 drink per day for nonpregnant women.  Eat healthy foods. This involves: ? Eating 5 or more servings of fruits and vegetables a day. ? Making dietary changes that address high blood pressure (hypertension), high cholesterol, diabetes, or obesity.  Manage your cholesterol levels. ? Making food choices that are high in fiber and low in saturated fat, trans fat, and cholesterol may control cholesterol levels. ? Take any prescribed medicines to control cholesterol as directed by your health care provider.  Manage your diabetes. ? Controlling your carbohydrate and sugar intake is recommended to manage diabetes. ? Take any prescribed medicines to control diabetes as directed by your health care provider.  Control your hypertension. ? Making food choices that are low in salt (sodium), saturated fat, trans fat, and cholesterol is recommended to manage hypertension. ? Ask your health care provider if you need treatment to lower your blood pressure. Take any prescribed medicines to control hypertension as directed by your health care provider. ? If you are 18-39 years of age, have your blood pressure checked every 3-5 years. If you are 40 years of age or older, have your blood pressure checked every year.  Maintain a healthy weight. ? Reducing calorie intake and making food choices that are low in sodium, saturated fat, trans fat, and cholesterol are recommended to manage  weight.  Stop drug abuse.  Avoid taking birth control pills. ? Talk to your health care provider about the risks of taking birth control pills if you are over 35 years old, smoke, get migraines, or have ever had a blood clot.  Get evaluated for sleep disorders (sleep apnea). ? Talk to your health care provider about getting a sleep evaluation if you snore a lot or have excessive sleepiness.  Take medicines only as directed by your health care provider. ? For some people, aspirin or blood thinners (anticoagulants) are helpful in reducing the risk of forming abnormal blood clots that can lead to stroke. If you have the irregular heart rhythm of atrial fibrillation, you should be on a blood thinner unless there is a good reason you cannot take them. ? Understand all your medicine instructions.  Make sure that other conditions (such as anemia or atherosclerosis) are addressed. Get help right away if:  You have sudden weakness or numbness of the face, arm, or leg, especially on one side of the body.  Your face or eyelid droops to one side.  You have sudden confusion.  You have trouble speaking (aphasia) or understanding.  You have sudden trouble seeing in one or both eyes.  You have sudden trouble walking.  You have dizziness.  You have a loss of balance or coordination.  You have a sudden, severe headache with no known cause.  You have new chest pain or an irregular heartbeat. Any of these symptoms may represent a serious problem that is an emergency. Do not wait to see if the symptoms will go away.   Get medical help at once. Call your local emergency services (911 in [place] Do not drive yourself to the hospital. This information is not intended to replace advice given to you by your health care provider. Make sure you discuss any questions you have with your health care provider. Document Released: 09/24/2004 Document Revised: 01/23/2016 Document Reviewed: 02/17/2013 Elsevier  Interactive Patient Education  2017 Elsevier Inc.     Preventing Cerebrovascular Disease Arteries are blood vessels that carry blood that contains oxygen from the heart to all parts of the body. Cerebrovascular disease affects arteries that supply the brain. Any condition that blocks or disrupts blood flow to the brain can cause cerebrovascular disease. Brain cells that lose blood supply start to die within minutes (stroke). Stroke is the main danger of cerebrovascular disease. Atherosclerosis and high blood pressure are common causes of cerebrovascular disease. Atherosclerosis is narrowing and hardening of an artery that results when fat, cholesterol, calcium, or other substances (plaque) build up inside an artery. Plaque reduces blood flow through the artery. High blood pressure increases the risk of bleeding inside the brain. Making diet and lifestyle changes to prevent atherosclerosis and high blood pressure lowers your risk of cerebrovascular disease. What nutrition changes can be made?  Eat more fruits, vegetables, and whole grains.  Reduce how much saturated fat you eat. To do this, eat less red meat and fewer full-fat dairy products.  Eat healthy proteins instead of red meat. Healthy proteins include: ? Fish. Eat fish that contains heart-healthy omega-3 fatty acids, twice a week. Examples include salmon, albacore tuna, mackerel, and herring. ? Chicken. ? Nuts. ? Low-fat or nonfat yogurt.  Avoid processed meats, like bacon and lunchmeat.  Avoid foods that contain: ? A lot of sugar, such as sweets and drinks with added sugar. ? A lot of salt (sodium). Avoid adding extra salt to your food, as told by your health care provider. ? Trans fats, such as margarine and baked goods. Trans fats may be listed as "partially hydrogenated oils" on food labels.  Check food labels to see how much sodium, sugar, and trans fats are in foods.  Use vegetable oils that contain low amounts of  saturated fat, such as olive oil or canola oil. What lifestyle changes can be made?  Drink alcohol in moderation. This means no more than 1 drink a day for nonpregnant women and 2 drinks a day for men. One drink equals 12 oz of beer, 5 oz of wine, or 1 oz of hard liquor.  If you are overweight, ask your health care provider to recommend a weight-loss plan for you. Losing 5-10 lb (2.2-4.5 kg) can reduce your risk of diabetes, atherosclerosis, and high blood pressure.  Exercise for 30?60 minutes on most days, or as much as told by your health care provider. ? Do moderate-intensity exercise, such as brisk walking, bicycling, and water aerobics. Ask your health care provider which activities are safe for you.  Do not use any products that contain nicotine or tobacco, such as cigarettes and e-cigarettes. If you need help quitting, ask your health care provider. Why are these changes important? Making these changes lowers your risk of many diseases that can cause cerebrovascular disease and stroke. Stroke is a leading cause of death and disability. Making these changes also improves your overall health and quality of life. What can I do to lower my risk? The following factors make you more likely to develop cerebrovascular disease:  Being overweight.  Smoking.  Being physically inactive.    Eating a high-fat diet.  Having certain health conditions, such as: ? Diabetes. ? High blood pressure. ? Heart disease. ? Atherosclerosis. ? High cholesterol. ? Sickle cell disease.  Talk with your health care provider about your risk for cerebrovascular disease. Work with your health care provider to control diseases that you have that may contribute to cerebrovascular disease. Your health care provider may prescribe medicines to help prevent major causes of cerebrovascular disease. Where to find more information: Learn more about preventing cerebrovascular disease from:  National Heart, Lung, and  Blood Institute: www.nhlbi.nih.gov/health/health-topics/topics/stroke  Centers for Disease Control and Prevention: cdc.gov/stroke/about.htm  Summary  Cerebrovascular disease can lead to a stroke.  Atherosclerosis and high blood pressure are major causes of cerebrovascular disease.  Making diet and lifestyle changes can reduce your risk of cerebrovascular disease.  Work with your health care provider to get your risk factors under control to reduce your risk of cerebrovascular disease. This information is not intended to replace advice given to you by your health care provider. Make sure you discuss any questions you have with your health care provider. Document Released: 09/01/2015 Document Revised: 03/06/2016 Document Reviewed: 09/01/2015 Elsevier Interactive Patient Education  2018 Elsevier Inc.  

## 2017-02-18 DIAGNOSIS — Z6828 Body mass index (BMI) 28.0-28.9, adult: Secondary | ICD-10-CM | POA: Diagnosis not present

## 2017-02-18 DIAGNOSIS — M47816 Spondylosis without myelopathy or radiculopathy, lumbar region: Secondary | ICD-10-CM | POA: Diagnosis not present

## 2017-02-18 DIAGNOSIS — I10 Essential (primary) hypertension: Secondary | ICD-10-CM | POA: Diagnosis not present

## 2017-02-18 NOTE — Addendum Note (Signed)
Addended by: Lianne Cure A on: 02/18/2017 12:25 PM   Modules accepted: Orders

## 2017-04-12 DIAGNOSIS — M47816 Spondylosis without myelopathy or radiculopathy, lumbar region: Secondary | ICD-10-CM | POA: Diagnosis not present

## 2017-04-24 ENCOUNTER — Other Ambulatory Visit: Payer: Self-pay | Admitting: Cardiovascular Disease

## 2017-04-29 DIAGNOSIS — M47816 Spondylosis without myelopathy or radiculopathy, lumbar region: Secondary | ICD-10-CM | POA: Diagnosis not present

## 2017-05-13 DIAGNOSIS — M47816 Spondylosis without myelopathy or radiculopathy, lumbar region: Secondary | ICD-10-CM | POA: Diagnosis not present

## 2017-05-24 ENCOUNTER — Ambulatory Visit (INDEPENDENT_AMBULATORY_CARE_PROVIDER_SITE_OTHER): Payer: Medicare HMO | Admitting: Family Medicine

## 2017-05-24 ENCOUNTER — Encounter: Payer: Self-pay | Admitting: Family Medicine

## 2017-05-24 VITALS — BP 142/68 | HR 57 | Temp 98.2°F | Wt 155.0 lb

## 2017-05-24 DIAGNOSIS — L749 Eccrine sweat disorder, unspecified: Secondary | ICD-10-CM

## 2017-05-24 DIAGNOSIS — Z23 Encounter for immunization: Secondary | ICD-10-CM | POA: Diagnosis not present

## 2017-05-24 DIAGNOSIS — G4700 Insomnia, unspecified: Secondary | ICD-10-CM | POA: Diagnosis not present

## 2017-05-24 LAB — COMPREHENSIVE METABOLIC PANEL
ALT: 114 U/L — ABNORMAL HIGH (ref 0–35)
AST: 40 U/L — ABNORMAL HIGH (ref 0–37)
Albumin: 3.7 g/dL (ref 3.5–5.2)
Alkaline Phosphatase: 48 U/L (ref 39–117)
BUN: 24 mg/dL — ABNORMAL HIGH (ref 6–23)
CO2: 30 mEq/L (ref 19–32)
Calcium: 9.3 mg/dL (ref 8.4–10.5)
Chloride: 107 mEq/L (ref 96–112)
Creatinine, Ser: 0.82 mg/dL (ref 0.40–1.20)
GFR: 71.81 mL/min (ref >60.00)
Glucose, Bld: 96 mg/dL (ref 70–99)
Potassium: 5.1 mEq/L (ref 3.5–5.1)
Sodium: 143 mEq/L (ref 135–145)
Total Bilirubin: 0.6 mg/dL (ref 0.2–1.2)
Total Protein: 5.8 g/dL — ABNORMAL LOW (ref 6.0–8.3)

## 2017-05-24 LAB — CBC WITH DIFFERENTIAL/PLATELET
Basophils Absolute: 0 10*3/uL (ref 0.0–0.1)
Basophils Relative: 0.1 % (ref 0.0–3.0)
Eosinophils Absolute: 0 10*3/uL (ref 0.0–0.7)
Eosinophils Relative: 0.2 % (ref 0.0–5.0)
HCT: 38.1 % (ref 36.0–46.0)
Hemoglobin: 12.3 g/dL (ref 12.0–15.0)
Lymphocytes Relative: 25.5 % (ref 12.0–46.0)
Lymphs Abs: 2.2 10*3/uL (ref 0.7–4.0)
MCHC: 32.3 g/dL (ref 30.0–36.0)
MCV: 84.8 fl (ref 78.0–100.0)
Monocytes Absolute: 0.7 10*3/uL (ref 0.1–1.0)
Monocytes Relative: 8.5 % (ref 3.0–12.0)
Neutro Abs: 5.7 10*3/uL (ref 1.4–7.7)
Neutrophils Relative %: 65.7 % (ref 43.0–77.0)
Platelets: 285 10*3/uL (ref 150.0–400.0)
RBC: 4.49 Mil/uL (ref 3.87–5.11)
RDW: 15.6 % — ABNORMAL HIGH (ref 11.5–15.5)
WBC: 8.7 10*3/uL (ref 4.0–10.5)

## 2017-05-24 LAB — TSH: TSH: 0.5 u[IU]/mL (ref 0.35–4.50)

## 2017-05-24 MED ORDER — ALPRAZOLAM 0.25 MG PO TABS: ORAL_TABLET | ORAL | Status: DC

## 2017-05-24 NOTE — Progress Notes (Signed)
Her back/leg pain is clearly better after prev ablation.  She has f/u pending.    She had urinary frequency on HCTZ and had to stop the medicine.  Urinary sx better off med, per patient report.   Frequent sweats.  Since the prev ablation in ~mid August for her back (and around the time she stopped HCTZ with Kdur) she has been having sweats during the day, "all over and my clothes get wet."  Not limited to the scalp.  She doesn't have night sweats.  Sweats can happen at rest.    No fevers, no chills.  She has some nausea and heartburn and is needing to take protonix BID along with zantac in the meantime, with relief and resolution of sx.  No vomiting.    No swelling.  No CP.  Not SOB but deconditioned from prev back pain and lack of activity.    She has insomnia.  Failed tx with mult meds.  D/w pt about trying 1/2 dose of xanax 0.25mg  tabs,ie 0.125mg  qhs.   Meds, vitals, and allergies reviewed.   ROS: Per HPI unless specifically indicated in ROS section   GEN: nad, alert and oriented HEENT: mucous membranes moist NECK: supple w/o LA, no tmg CV: rrr.  PULM: ctab, no inc wob ABD: soft, +bs EXT: no edema SKIN: no acute rash

## 2017-05-24 NOTE — Patient Instructions (Signed)
Try trying 1/2 dose of xanax at night.  See if you can tolerate that and still sleep well.  Go to the lab on the way out.  We'll contact you with your lab report. Take care.  Glad to see you.

## 2017-05-25 ENCOUNTER — Other Ambulatory Visit: Payer: Self-pay | Admitting: Family Medicine

## 2017-05-25 NOTE — Telephone Encounter (Signed)
Last ov 05/24/2017. Change made to dosing. pls advise

## 2017-05-26 NOTE — Telephone Encounter (Signed)
Please call in.  Thanks.   

## 2017-05-26 NOTE — Telephone Encounter (Signed)
Rx called in to requested pharmacy 

## 2017-05-28 ENCOUNTER — Other Ambulatory Visit: Payer: Self-pay | Admitting: Family Medicine

## 2017-05-28 DIAGNOSIS — L749 Eccrine sweat disorder, unspecified: Secondary | ICD-10-CM | POA: Insufficient documentation

## 2017-05-28 DIAGNOSIS — R7989 Other specified abnormal findings of blood chemistry: Secondary | ICD-10-CM

## 2017-05-28 DIAGNOSIS — G4700 Insomnia, unspecified: Secondary | ICD-10-CM | POA: Insufficient documentation

## 2017-05-28 DIAGNOSIS — R945 Abnormal results of liver function studies: Principal | ICD-10-CM

## 2017-05-28 NOTE — Assessment & Plan Note (Signed)
Failed tx with mult meds.  D/w pt about trying 1/2 dose of xanax 0.25mg  tabs,ie 0.125mg  qhs. She can update [place] as needed.

## 2017-05-28 NOTE — Assessment & Plan Note (Signed)
Since the prev ablation in ~mid August for her back (and around the time she stopped HCTZ with Kdur) she has been having sweats during the day.  No night sweats. I don't suspect this is related to the ablation or any ominous process but could be related to stopping HCTZ in the mist of summertime heat.  No CP.  Okay for outpatient f/u.  Would check routine labs today.  See notes on labs.  >25 minutes spent in face to face time with patient, >50% spent in counselling or coordination of care.

## 2017-05-31 ENCOUNTER — Telehealth: Payer: Self-pay | Admitting: *Deleted

## 2017-05-31 ENCOUNTER — Other Ambulatory Visit (INDEPENDENT_AMBULATORY_CARE_PROVIDER_SITE_OTHER): Payer: Medicare HMO

## 2017-05-31 DIAGNOSIS — R945 Abnormal results of liver function studies: Secondary | ICD-10-CM | POA: Diagnosis not present

## 2017-05-31 DIAGNOSIS — R7989 Other specified abnormal findings of blood chemistry: Secondary | ICD-10-CM

## 2017-05-31 LAB — HEPATIC FUNCTION PANEL
ALT: 79 U/L — ABNORMAL HIGH (ref 0–35)
AST: 29 U/L (ref 0–37)
Albumin: 3.7 g/dL (ref 3.5–5.2)
Alkaline Phosphatase: 46 U/L (ref 39–117)
Bilirubin, Direct: 0.1 mg/dL (ref 0.0–0.3)
Total Bilirubin: 0.6 mg/dL (ref 0.2–1.2)
Total Protein: 5.9 g/dL — ABNORMAL LOW (ref 6.0–8.3)

## 2017-05-31 NOTE — Telephone Encounter (Signed)
Patient advised.

## 2017-05-31 NOTE — Telephone Encounter (Signed)
Await the labs, would stop crestor in the meantime.  Thanks.

## 2017-05-31 NOTE — Telephone Encounter (Signed)
Patient was instructed to stop Zetia on the last lab report and has been off of it for a few days now and really doesn't feel much different.  Patient says she wonders if the problem is the Crestor and the Zetia and is thinking about going off the Crestor too.  Patient says Crestor made her sick when she first started it but it was at a higher dose.  So they stopped it, then restarted at a lower dose and then added the Zetia so she doesn't know what is causing the problem.  Please advise.

## 2017-06-01 ENCOUNTER — Other Ambulatory Visit: Payer: Self-pay | Admitting: Family Medicine

## 2017-06-01 DIAGNOSIS — R7989 Other specified abnormal findings of blood chemistry: Secondary | ICD-10-CM

## 2017-06-01 DIAGNOSIS — R945 Abnormal results of liver function studies: Principal | ICD-10-CM

## 2017-06-02 ENCOUNTER — Telehealth: Payer: Self-pay | Admitting: *Deleted

## 2017-06-02 NOTE — Telephone Encounter (Signed)
Praluent prior authorization received through [email or web address] for this patient. Patient does not currently have this prescribed and last prescribed was in 2016. Archived prior authorization request at this time. Patient does have upcoming appointment with Dr. Rockey Situ the end of the month.

## 2017-06-02 NOTE — Telephone Encounter (Signed)
-----   Message from Leeroy Bock, [facility] sent at 06/02/2017  7:30 AM EDT ----- Regarding: RE: Covermymeds I don't know, it doesn't look like we have ever been involved in treating pt over here and I don't even see Praluent on her med list. Doesn't look as though she started Praluent. Would f/u with Dr Rockey Situ about this.  Thanks, Megan  ----- Message ----- From: Valora Corporal, RN Sent: 06/01/2017   5:23 PM To: Erskine Emery, RPH, Leeroy Bock, RPH Subject: Covermymeds                                    I was reviewing covermymeds and they have a request for Catherine Green for [employer]. Do I need to complete this? Key is [employer], Lesleigh Noe.

## 2017-06-08 ENCOUNTER — Other Ambulatory Visit (INDEPENDENT_AMBULATORY_CARE_PROVIDER_SITE_OTHER): Payer: Medicare HMO

## 2017-06-08 DIAGNOSIS — R945 Abnormal results of liver function studies: Secondary | ICD-10-CM | POA: Diagnosis not present

## 2017-06-08 DIAGNOSIS — R7989 Other specified abnormal findings of blood chemistry: Secondary | ICD-10-CM

## 2017-06-08 LAB — HEPATIC FUNCTION PANEL
ALT: 30 U/L (ref 0–35)
AST: 17 U/L (ref 0–37)
Albumin: 3.7 g/dL (ref 3.5–5.2)
Alkaline Phosphatase: 48 U/L (ref 39–117)
Bilirubin, Direct: 0.1 mg/dL (ref 0.0–0.3)
Total Bilirubin: 0.5 mg/dL (ref 0.2–1.2)
Total Protein: 5.9 g/dL — ABNORMAL LOW (ref 6.0–8.3)

## 2017-06-11 ENCOUNTER — Other Ambulatory Visit: Payer: Self-pay | Admitting: Family Medicine

## 2017-06-11 DIAGNOSIS — R7989 Other specified abnormal findings of blood chemistry: Secondary | ICD-10-CM

## 2017-06-11 DIAGNOSIS — R945 Abnormal results of liver function studies: Principal | ICD-10-CM

## 2017-06-23 ENCOUNTER — Telehealth: Payer: Self-pay | Admitting: Family Medicine

## 2017-06-23 NOTE — Telephone Encounter (Signed)
Glad she is doing better.  At this point I would try restarting the crestor and recheck LFTs in 2 weeks. Labs ordered.  If she starts to feel bad at all then stop the medicine and let us know.  I would only do one thing at a time, meaning restart the Crestor, so we will no exactly what caused this. Thanks.

## 2017-06-23 NOTE — Telephone Encounter (Signed)
Patient notified as instructed by telephone and verbalized understanding. Lab appointment scheduled.

## 2017-06-23 NOTE — Telephone Encounter (Signed)
Copied from [facility][id number]. Topic: General - Other >> Jun 22, 2017  9:20 AM Catherine Green wrote: Reason for CRM:  Patient is calling to tell DR. Godric Lavell or his nurse know that she is doing ok since her blood work. The dr wanted to know if she was doing better. She is doing great!

## 2017-06-27 NOTE — Progress Notes (Signed)
Cardiology Office Note  Date:  06/28/2017   ID:  Catherine Green, DOB August 08, 1941, MRN [id number]  PCP:  Tonia Ghent, MD   Chief Complaint  Patient presents with  . other    6 month follow up. Meds reviewed by the pt. verbally. "doing well."     HPI:  Catherine Green is a very pleasant 76 year-old woman with history  coronary artery disease,  bypass surgery,  peripheral vascular disease   right CEA, left CEA followed by [name], catheterization January 2009 showing patent vein grafts, atretic LIMA to the LAD. Hx of anxiety Back surgery in May 2017,  who presents for routine followup of her coronary artery disease.   Off many meds for elevated LFTS,  Numbers are now trending down and follow-up lab work restarting meds Restarted crestor 20 mg,  having cramps in her legs Has not restarted zetia Reports that previously she had to work up to [employer] 20 slowly to avoid cramping  Off HCTZ for polyuria BP low today Stopped her potassium  Chronic issue "all day" with nausea/diarrhea,  After eating, anorexia, abdominal pain   Back surgery in May 2017, No regular exercise program  40 to 50% on the left <39% on the right Followed in [facility]  EKG personally reviewed by myself on todays visit shows normal sinus rhythm with rate 60 bpm, no significant ST or T wave changes  past medical history reviewed Previous MRI of her back reviewed with her showing DJD, foraminal narrowing Seen by [name].   Previously went on a cruise and developed pneumonia, required ABX Reports having labile blood pressure.   Previous symptoms of shortness of breath and weight gain. Improved with Lasix to take as needed.  She had her gallbladder taken out 03/13/2014.  she had "hair loss" on simvastatin, as well as sores on her head. Lipitor caused leg problems/myalgias.   Previously stopped her "nerve" pill. Significant stressors as husband is sick. She was previously using the  treadmill on a regular basis   PMH:   has a past medical history of Allergy; Anemia; Anxiety; Carotid artery occlusion; Cerebrovascular disease; Chicken pox; Coronary artery disease; Depression; Dizziness; Dizziness; DVT (deep venous thrombosis) ([place]); Fainting spell; GERD (gastroesophageal reflux disease); Headache; Heart murmur; Hyperlipidemia; Hypertension; PONV (postoperative nausea and vomiting); Pre-syncope; PVD (peripheral vascular disease) ([place]); Seasonal allergies; Shortness of breath dyspnea; Swelling of both ankles; Thrombophlebitis; and Ulcer.  PSH:    Past Surgical History:  Procedure Laterality Date  . APPENDECTOMY    . BACK SURGERY  01/14/16  . BREAST SURGERY     saline implants  . CARDIAC CATHETERIZATION    . CAROTID ENDARTERECTOMY  1992  . CAROTID ENDARTERECTOMY Right February 13, 2005   Re-do Right CE  . CATARACT EXTRACTION, BILATERAL  2013  . CHOLECYSTECTOMY, LAPAROSCOPIC  04/02/14   Dr. Rochel Brome  . CORONARY ARTERY BYPASS GRAFT  2006   x3 Dr. Prescott Gum  . ENDARTERECTOMY Left 07/20/2014   Procedure: ENDARTERECTOMY CAROTID-LEFT;  Surgeon: Rosetta Posner, MD;  Location: [place];  Service: Vascular;  Laterality: Left;  . EYE SURGERY Bilateral Feb. 2013   Cataract Left eye  . SEPTOPLASTY     with bilateral inferior turbinate reductions  . SPINE SURGERY  12/2015  . TOTAL ABDOMINAL HYSTERECTOMY    . UPPER GASTROINTESTINAL ENDOSCOPY     had polyps removed from esophagus  . UPPER GI ENDOSCOPY      Current Outpatient Prescriptions  Medication Sig Dispense  Refill  . ALPRAZolam (XANAX) 0.25 MG tablet Take 0.5 tablets (0.125 mg total) by mouth at bedtime as needed for anxiety. 15 tablet 2  . aspirin 81 MG tablet Take 162 mg by mouth daily.     . carvedilol (COREG) 3.125 MG tablet TAKE 1 TABLET BY MOUTH TWICE A DAY 180 tablet 3  . Coenzyme Q10 (COQ10 PO) Take by mouth daily.    Marland Kitchen ezetimibe (ZETIA) 10 MG tablet TAKE ONE TABLET BY MOUTH EVERY DAY 90 tablet 3  . guaiFENesin  (MUCINEX) 600 MG 12 hr tablet Take 600 mg by mouth daily.    Marland Kitchen loratadine (CLARITIN) 10 MG tablet Take 10 mg by mouth daily as needed for allergies.    . pantoprazole (PROTONIX) 20 MG tablet Take 20 mg by mouth 2 (two) times daily.    . ranitidine (ZANTAC) 150 MG tablet Take 1 tablet (150 mg total) by mouth 2 (two) times daily as needed for heartburn (or abdominal pain).    . rosuvastatin (CRESTOR) 20 MG tablet Take 1 tablet (20 mg total) by mouth daily. 90 tablet 3   No current facility-administered medications for this visit.      Allergies:   Lipitor [atorvastatin]; Oxycodone hcl; Protonix [pantoprazole sodium]; Doxycycline; Hydrochlorothiazide; Hydrocodone; Metoprolol; Penicillin g; Tramadol; Trazodone and nefazodone; Zoloft [sertraline]; Augmentin [amoxicillin-pot clavulanate]; Codeine; Demerol; Ivp dye [iodinated diagnostic agents]; and Shellfish allergy   Social History:  The patient  reports that she quit smoking about 20 years ago. Her smoking use included Cigarettes. She has a 4.50 pack-year smoking history. She has never used smokeless tobacco. She reports that she does not drink alcohol or use drugs.   Family History:   family history includes Aneurysm in her brother and mother; Birth defects in her brother; Bleeding Disorder in her mother and sister; Cancer in her sister; Cirrhosis in her father; Deep vein thrombosis in her brother; Heart attack in her father; Heart disease in her brother, father, mother, and sister; Hyperlipidemia in her brother, daughter, father, mother, and sister; Hypertension in her brother, daughter, father, mother, other, and sister; Peripheral vascular disease in her daughter; Varicose Veins in her mother and sister.    Review of Systems: Review of Systems  Constitutional: Negative.   Respiratory: Negative.   Cardiovascular: Negative.   Gastrointestinal: Positive for abdominal pain, constipation, diarrhea and nausea.  Musculoskeletal: Positive for back  pain.  Neurological: Negative.   Psychiatric/Behavioral: Negative.   All other systems reviewed and are negative.    PHYSICAL EXAM: VS:  BP (!) 110/50 (BP Location: Left Arm, Patient Position: Sitting, Cuff Size: Normal)   Pulse (!) 59   Ht 5' 2.5" (1.588 m)   Wt 156 lb 4 oz (70.9 kg)   BMI 28.12 kg/m  , BMI Body mass index is 28.12 kg/m. GEN: Well nourished, well developed, in no acute distress  HEENT: normal  Neck: no JVD, + 2 on the right carotid bruits, or masses Cardiac: RRR; no murmurs, rubs, or gallops,no edema  Respiratory:  clear to auscultation bilaterally, normal work of breathing GI: soft, nontender, nondistended, + BS MS: no deformity or atrophy  Skin: warm and dry, no rash Neuro:  Strength and sensation are intact Psych: euthymic mood, full affect    Recent Labs: 05/24/2017: BUN 24; Creatinine, Ser 0.82; Hemoglobin 12.3; Platelets 285.0; Potassium 5.1; Sodium 143; TSH 0.50 06/08/2017: ALT 30    Lipid Panel Lab Results  Component Value Date   CHOL 127 01/07/2017   HDL 54.70 01/07/2017  LDLCALC 48 01/07/2017   TRIG 119.0 01/07/2017      Wt Readings from Last 3 Encounters:  06/28/17 156 lb 4 oz (70.9 kg)  05/24/17 155 lb (70.3 kg)  02/17/17 157 lb 9.6 oz (71.5 kg)       ASSESSMENT AND PLAN:   Carotid stenosis, bilateral -  Stable disease bilaterally , results discussed with her in detail continue aggressive cholesterol management Managed in [place]  Essential hypertension - Plan: EKG 12-Lead, Hepatic function panel, Lipid Profile No changes made to the medications.  Blood pressure borderline low She is off her HCTZ  Hyperlipidemia, unspecified hyperlipidemia type -  Cramping on Crestor 20, she will try 10 mg for 1 month before increasing back to 20 mg Suggested she restart the Zetia  Atherosclerosis of coronary artery bypass graft of native heart without angina pectoris Currently with no anginal symptoms. Active with no complaints No  further testing at this time. Stable  Abdominal pain Concerning for mesenteric ischemia She has severe PAD, high risk Significant GI symptoms We have ordered mesenteric duplex   Total encounter time more than 25 minutes  Greater than 50% was spent in counseling and coordination of care with the patient   Disposition:   F/U  12 months   No orders of the defined types were placed in this encounter.    Signed, Esmond Plants, M.D., Ph.D. 06/28/2017  [place], [place]

## 2017-06-28 ENCOUNTER — Ambulatory Visit: Payer: Medicare HMO | Admitting: Cardiovascular Disease

## 2017-06-28 ENCOUNTER — Encounter: Payer: Self-pay | Admitting: Cardiovascular Disease

## 2017-06-28 VITALS — BP 110/50 | HR 59 | Ht 62.5 in | Wt 156.2 lb

## 2017-06-28 DIAGNOSIS — R630 Anorexia: Secondary | ICD-10-CM

## 2017-06-28 DIAGNOSIS — I6523 Occlusion and stenosis of bilateral carotid arteries: Secondary | ICD-10-CM | POA: Diagnosis not present

## 2017-06-28 DIAGNOSIS — R109 Unspecified abdominal pain: Secondary | ICD-10-CM

## 2017-06-28 DIAGNOSIS — I739 Peripheral vascular disease, unspecified: Secondary | ICD-10-CM

## 2017-06-28 DIAGNOSIS — I10 Essential (primary) hypertension: Secondary | ICD-10-CM | POA: Diagnosis not present

## 2017-06-28 DIAGNOSIS — I25708 Atherosclerosis of coronary artery bypass graft(s), unspecified, with other forms of angina pectoris: Secondary | ICD-10-CM

## 2017-06-28 DIAGNOSIS — I5033 Acute on chronic diastolic (congestive) heart failure: Secondary | ICD-10-CM

## 2017-06-28 NOTE — Patient Instructions (Addendum)
Medication Instructions:   No medication changes made  Restart zetia Try 1/2 dose crestor for one month before full dose  Labwork:  No new labs needed  Testing/Procedures:  We will order an arterial u/s of the ABD aorta, SMA, celiac artery   Follow-Up: It was a pleasure seeing you in the office today. Please call us if you have new issues that need to be addressed before your next appt.  [phone]  Your physician wants you to follow-up in: 12 months.  You will receive a reminder letter in the mail two months in advance. If you don't receive a letter, please call our office to schedule the follow-up appointment.  If you need a refill on your cardiac medications before your next appointment, please call your pharmacy.

## 2017-06-29 ENCOUNTER — Other Ambulatory Visit: Payer: Self-pay | Admitting: Cardiovascular Disease

## 2017-06-29 DIAGNOSIS — I739 Peripheral vascular disease, unspecified: Secondary | ICD-10-CM

## 2017-06-29 DIAGNOSIS — R630 Anorexia: Secondary | ICD-10-CM

## 2017-06-29 DIAGNOSIS — R109 Unspecified abdominal pain: Secondary | ICD-10-CM

## 2017-07-02 ENCOUNTER — Other Ambulatory Visit: Payer: Self-pay | Admitting: Cardiovascular Disease

## 2017-07-02 ENCOUNTER — Ambulatory Visit (INDEPENDENT_AMBULATORY_CARE_PROVIDER_SITE_OTHER): Payer: Medicare HMO

## 2017-07-02 DIAGNOSIS — R1084 Generalized abdominal pain: Secondary | ICD-10-CM | POA: Diagnosis not present

## 2017-07-02 DIAGNOSIS — R630 Anorexia: Secondary | ICD-10-CM | POA: Diagnosis not present

## 2017-07-02 DIAGNOSIS — R109 Unspecified abdominal pain: Secondary | ICD-10-CM

## 2017-07-09 ENCOUNTER — Other Ambulatory Visit (INDEPENDENT_AMBULATORY_CARE_PROVIDER_SITE_OTHER): Payer: Medicare HMO

## 2017-07-09 DIAGNOSIS — R945 Abnormal results of liver function studies: Secondary | ICD-10-CM

## 2017-07-09 DIAGNOSIS — R7989 Other specified abnormal findings of blood chemistry: Secondary | ICD-10-CM

## 2017-07-09 LAB — HEPATIC FUNCTION PANEL
ALT: 15 U/L (ref 0–35)
AST: 12 U/L (ref 0–37)
Albumin: 4.1 g/dL (ref 3.5–5.2)
Alkaline Phosphatase: 60 U/L (ref 39–117)
Bilirubin, Direct: 0.2 mg/dL (ref 0.0–0.3)
Total Bilirubin: 0.7 mg/dL (ref 0.2–1.2)
Total Protein: 6.6 g/dL (ref 6.0–8.3)

## 2017-07-19 DIAGNOSIS — I10 Essential (primary) hypertension: Secondary | ICD-10-CM | POA: Diagnosis not present

## 2017-07-19 DIAGNOSIS — Z6829 Body mass index (BMI) 29.0-29.9, adult: Secondary | ICD-10-CM | POA: Diagnosis not present

## 2017-07-19 DIAGNOSIS — M47816 Spondylosis without myelopathy or radiculopathy, lumbar region: Secondary | ICD-10-CM | POA: Diagnosis not present

## 2017-07-28 DIAGNOSIS — R69 Illness, unspecified: Secondary | ICD-10-CM | POA: Diagnosis not present

## 2017-07-30 ENCOUNTER — Other Ambulatory Visit: Payer: Self-pay | Admitting: Cardiovascular Disease

## 2017-08-04 ENCOUNTER — Ambulatory Visit (INDEPENDENT_AMBULATORY_CARE_PROVIDER_SITE_OTHER): Payer: Medicare HMO | Admitting: Family Medicine

## 2017-08-04 ENCOUNTER — Encounter: Payer: Self-pay | Admitting: Family Medicine

## 2017-08-04 VITALS — BP 140/70 | HR 68 | Temp 98.3°F | Wt 161.8 lb

## 2017-08-04 DIAGNOSIS — R0600 Dyspnea, unspecified: Secondary | ICD-10-CM

## 2017-08-04 DIAGNOSIS — R0789 Other chest pain: Secondary | ICD-10-CM

## 2017-08-04 DIAGNOSIS — R300 Dysuria: Secondary | ICD-10-CM

## 2017-08-04 DIAGNOSIS — R0602 Shortness of breath: Secondary | ICD-10-CM

## 2017-08-04 DIAGNOSIS — R350 Frequency of micturition: Secondary | ICD-10-CM | POA: Diagnosis not present

## 2017-08-04 LAB — COMPREHENSIVE METABOLIC PANEL
AG Ratio: 2 (calc) (ref 1.0–2.5)
ALT: 13 U/L (ref 6–29)
AST: 18 U/L (ref 10–35)
Albumin: 4.1 g/dL (ref 3.6–5.1)
Alkaline phosphatase (APISO): 72 U/L (ref 33–130)
BUN: 21 mg/dL (ref 7–25)
CO2: 28 mmol/L (ref 20–32)
Calcium: 9.5 mg/dL (ref 8.6–10.4)
Chloride: 105 mmol/L (ref 98–110)
Creat: 0.81 mg/dL (ref 0.60–0.93)
Globulin: 2.1 g/dL (calc) (ref 1.9–3.7)
Glucose, Bld: 98 mg/dL (ref 65–99)
Potassium: 5.3 mmol/L (ref 3.5–5.3)
Sodium: 142 mmol/L (ref 135–146)
Total Bilirubin: 0.5 mg/dL (ref 0.2–1.2)
Total Protein: 6.2 g/dL (ref 6.1–8.1)

## 2017-08-04 LAB — POCT URINALYSIS DIPSTICK
Bilirubin, UA: NEGATIVE
Glucose, UA: NEGATIVE
Ketones, UA: NEGATIVE
Nitrite, UA: NEGATIVE
Protein, UA: POSITIVE
Spec Grav, UA: 1.025 (ref 1.010–1.025)
Urobilinogen, UA: 0.2 E.U./dL
pH, UA: 6 (ref 5.0–8.0)

## 2017-08-04 LAB — CBC WITH DIFFERENTIAL/PLATELET
Basophils Absolute: 30 cells/uL (ref 0–200)
Basophils Relative: 0.4 %
Eosinophils Absolute: 30 cells/uL (ref 15–500)
Eosinophils Relative: 0.4 %
HCT: 33.8 % — ABNORMAL LOW (ref 35.0–45.0)
Hemoglobin: 11.1 g/dL — ABNORMAL LOW (ref 11.7–15.5)
Lymphs Abs: 1930 cells/uL (ref 850–3900)
MCH: 27.8 pg (ref 27.0–33.0)
MCHC: 32.8 g/dL (ref 32.0–36.0)
MCV: 84.5 fL (ref 80.0–100.0)
MPV: 9.8 fL (ref 7.5–12.5)
Monocytes Relative: 6.3 %
Neutro Abs: 5130 cells/uL (ref 1500–7800)
Neutrophils Relative %: 67.5 %
Platelets: 302 10*3/uL (ref 140–400)
RBC: 4 10*6/uL (ref 3.80–5.10)
RDW: 13.4 % (ref 11.0–15.0)
Total Lymphocyte: 25.4 %
WBC mixed population: 479 cells/uL (ref 200–950)
WBC: 7.6 10*3/uL (ref 3.8–10.8)

## 2017-08-04 LAB — BRAIN NATRIURETIC PEPTIDE: Brain Natriuretic Peptide: 172 pg/mL — ABNORMAL HIGH (ref <100)

## 2017-08-04 LAB — TROPONIN I: Troponin I: 0.01 ng/mL (ref <=0.0)

## 2017-08-04 MED ORDER — SULFAMETHOXAZOLE-TRIMETHOPRIM 800-160 MG PO TABS: 1.0000 | ORAL_TABLET | Freq: Two times a day (BID) | ORAL | 0 refills | Status: DC

## 2017-08-04 NOTE — Patient Instructions (Addendum)
Go to the lab on the way out.  We'll contact you with your lab report. Drink enough of water to keep your urine clear or light colored and start the antibiotics today.  We'll contact you with your lab report.   If you get more chest pain then go to the ER.  Take care.  Glad to see you.

## 2017-08-04 NOTE — Progress Notes (Signed)
Dysuria: yes duration of symptoms: 2-3 days.   abdominal pain: pain and pressure with urination, episodically.   fevers:no back pain:no  Vomiting: no  ucx pending.    She has more BLE edema recently.  She is off HCTZ.  She has not been more SOB (still with some episodic SOB at baseline) but had some chest tightness recently.  No CP now.  She has been eating more salty foods recently.    She had sweats return when back on zetia and crestor, but she wasn't sure which one was causing.  D/w pt about options.    Meds, vitals, and allergies reviewed.   Per HPI unless specifically indicated in ROS section   GEN: nad, alert and oriented HEENT: mucous membranes moist NECK: supple CV: rrr.  PULM: ctab, no inc wob ABD: soft, +bs, suprapubic area not tender EXT: 1+ BLE edema BACK: no CVA pain  EKG w/o acute change, no sig change from prev.  D/w pt.

## 2017-08-05 ENCOUNTER — Telehealth: Payer: Self-pay

## 2017-08-05 ENCOUNTER — Other Ambulatory Visit: Payer: Self-pay | Admitting: Family Medicine

## 2017-08-05 ENCOUNTER — Other Ambulatory Visit: Payer: Self-pay | Admitting: *Deleted

## 2017-08-05 LAB — URINE CULTURE
MICRO NUMBER:: 81368004
Result:: NO GROWTH
SPECIMEN QUALITY:: ADEQUATE

## 2017-08-05 MED ORDER — HYDROCHLOROTHIAZIDE 12.5 MG PO TABS: ORAL_TABLET | ORAL | 0 refills | Status: DC

## 2017-08-05 MED ORDER — HYDROCHLOROTHIAZIDE 12.5 MG PO TABS: 12.5000 mg | ORAL_TABLET | Freq: Every day | ORAL | 0 refills | Status: DC | PRN

## 2017-08-05 NOTE — Telephone Encounter (Signed)
PLEASE NOTE: All timestamps contained within this report are represented as [place] Standard Time. CONFIDENTIALTY NOTICE: This fax transmission is intended only for the addressee. It contains information that is legally privileged, confidential or otherwise protected from use or disclosure. If you are not the intended recipient, you are strictly prohibited from reviewing, disclosing, copying using or disseminating any of this information or taking any action in reliance on or regarding this information. If you have received this fax in error, please notify us immediately by telephone so that we can arrange for its return to [place]. Phone: [phone], Toll-Free: [phone], Fax: [phone] Page: 1 of 1 Call Id: [id number] [place] Night - Client Nonclinical Telephone Record [place] Night - Client Client Site [place] Physician Renford Dills - MD Contact Type Call Who Is Calling Lab Lab Name [facility] Phone Number [phone] Lab Tech Name [facility] Reference Number [id number] d Patient Name Catherine Green Patient DOB June 08, 1941 Call Type Lab Message Only Reason for Call Report lab results Initial Comment Caller [facility] will be faxing non urgent test results Additional Comment Call Closed By: Jeanette Caprice Transaction Date/Time: 08/04/2017 9:25:31 PM (ET)

## 2017-08-05 NOTE — Assessment & Plan Note (Signed)
With some BLE edema with inc in salt recently.  D/w pt about options.  EKG w/o sig changes, no CP now.  Check troponin, see notes on labs.  If more sx, then to ER. >25 minutes spent in face to face time with patient, >50% spent in counselling or coordination of care. Continue statin/zetia for now.

## 2017-08-05 NOTE — Assessment & Plan Note (Signed)
Presumed UTI, start septra, check ucx.  She agrees.

## 2017-08-12 ENCOUNTER — Telehealth: Payer: Self-pay | Admitting: *Deleted

## 2017-08-12 NOTE — Telephone Encounter (Signed)
-----   Message from Minna Merritts, MD sent at 08/08/2017 10:24 AM EST ----- Not convinced all of her leg swelling is cardiac related would have her Wear compression hose, start the HCTZ  watch the salt Through the holidays call us back After the holidays and let us know how her edema is going  TG  ----- Message ----- From: Tonia Ghent, MD Sent: 08/05/2017   8:51 AM To: Josetta Huddle, CMA, Minna Merritts, MD  Call pt.  Labs are okay except for minimal inc in BNP, that goes along with the inc in leg edema.  Would try HCTZ.  rx sent.  She has baseline minimal dec in HGB, stable.   Still avoid salty foods.   I want her to get back to cardiology- routed to Dr. Donivan Scull clinic to schedule, with appreciation.   Continue with the abx re: UTI.  ucx pending.  Thanks.

## 2017-08-12 NOTE — Telephone Encounter (Signed)
Spoke with patient and reviewed Dr. Donivan Scull recommendations. She reports that after restarting her HCTZ her swelling is gone and she has been doing fine. Instructed her to please give [place] a call back if her swelling returns or continues to be a problem. She verbalized understanding with no further questions at this time.

## 2017-09-22 DIAGNOSIS — M47816 Spondylosis without myelopathy or radiculopathy, lumbar region: Secondary | ICD-10-CM | POA: Diagnosis not present

## 2017-09-22 DIAGNOSIS — Z6829 Body mass index (BMI) 29.0-29.9, adult: Secondary | ICD-10-CM | POA: Diagnosis not present

## 2017-09-22 DIAGNOSIS — I10 Essential (primary) hypertension: Secondary | ICD-10-CM | POA: Diagnosis not present

## 2017-09-23 DIAGNOSIS — H60543 Acute eczematoid otitis externa, bilateral: Secondary | ICD-10-CM | POA: Diagnosis not present

## 2017-09-23 DIAGNOSIS — H6123 Impacted cerumen, bilateral: Secondary | ICD-10-CM | POA: Diagnosis not present

## 2017-09-26 NOTE — Progress Notes (Signed)
Cardiology Office Note  Date:  09/28/2017   ID:  Catherine Green, DOB December 10, 1940, MRN [id number]  PCP:  Tonia Ghent, MD   Chief Complaint  Patient presents with  . Other    per Orlinda Blalock - feet swelling badly, chest pains, SOB. Meds reviewed verbally with patient.     HPI:  Catherine Green is a very pleasant 77 year-old woman with history  coronary artery disease,  bypass surgery,  peripheral vascular disease   right CEA, left CEA followed by [name], catheterization January 2009 showing patent vein grafts, atretic LIMA to the LAD. Hx of anxiety Back surgery in May 2017,  who presents for routine followup of her coronary artery disease.   Recent stressors, loss of several family members over the holidays Took Xanax low dose but still unable to sleep Goes to bed at 10:00pm  rolls around for a few hours wakes up at 4 AM Feels like a walking zombie, tearful in the exam room today tried sleeping in a chair Tried sleeping in a chair  Having palpitations, flipping flopping in her chest into her stomach Can stop thinking about recent stressors  Leg swelling, Eating out some Sitting around quite a bit, does not like wearing her compression hose  Tolerating Crestor and Zetia Total cholesterol down to 120s  Taking minimal dose HCTZ She does have Lasix at home but is not taking this Blood pressure stable No regular exercise program chronic constipation    EKG personally reviewed by myself on todays visit shows normal sinus rhythm with rate 58 bpm, no significant ST or T wave changes  Other past medical history reviewed Chronic issue "all day" with nausea/diarrhea,  After eating, anorexia, abdominal pain   Back surgery in May 2017, No regular exercise program  40 to 50% on the left <39% on the right Followed in [place]  past medical history reviewed Previous MRI of her back reviewed with her showing DJD, foraminal narrowing Seen by [name].   Previously  went on a cruise and developed pneumonia, required ABX Reports having labile blood pressure.   Previous symptoms of shortness of breath and weight gain. Improved with Lasix to take as needed.  She had her gallbladder taken out 03/13/2014.  she had "hair loss" on simvastatin, as well as sores on her head. Lipitor caused leg problems/myalgias.   Previously stopped her "nerve" pill. Significant stressors as husband is sick. She was previously using the treadmill on a regular basis   PMH:   has a past medical history of Allergy, Anemia, Anxiety, Carotid artery occlusion, Cerebrovascular disease, Chicken pox, Coronary artery disease, Depression, Dizziness, Dizziness, DVT (deep venous thrombosis) ([place]), Fainting spell, GERD (gastroesophageal reflux disease), Headache, Heart murmur, Hyperlipidemia, Hypertension, PONV (postoperative nausea and vomiting), Pre-syncope, PVD (peripheral vascular disease) ([place]), Seasonal allergies, Shortness of breath dyspnea, Swelling of both ankles, Thrombophlebitis, and Ulcer.  PSH:    Past Surgical History:  Procedure Laterality Date  . APPENDECTOMY    . BACK SURGERY  01/14/16  . BREAST SURGERY     saline implants  . CARDIAC CATHETERIZATION    . CAROTID ENDARTERECTOMY  1992  . CAROTID ENDARTERECTOMY Right February 13, 2005   Re-do Right CE  . CATARACT EXTRACTION, BILATERAL  2013  . CHOLECYSTECTOMY, LAPAROSCOPIC  04/02/14   Dr. Rochel Brome  . CORONARY ARTERY BYPASS GRAFT  2006   x3 Dr. Prescott Gum  . ENDARTERECTOMY Left 07/20/2014   Procedure: ENDARTERECTOMY CAROTID-LEFT;  Surgeon: Rosetta Posner,  MD;  Location: [place];  Service: Vascular;  Laterality: Left;  . EYE SURGERY Bilateral Feb. 2013   Cataract Left eye  . SEPTOPLASTY     with bilateral inferior turbinate reductions  . SPINE SURGERY  12/2015  . TOTAL ABDOMINAL HYSTERECTOMY    . UPPER GASTROINTESTINAL ENDOSCOPY     had polyps removed from esophagus  . UPPER GI ENDOSCOPY      Current Outpatient  Medications  Medication Sig Dispense Refill  . aspirin 81 MG tablet Take 162 mg by mouth daily.     . carvedilol (COREG) 3.125 MG tablet TAKE 1 TABLET BY MOUTH TWICE A DAY 180 tablet 3  . Coenzyme Q10 (CO Q10) 100 MG CAPS Take 100 mg by mouth daily.    Marland Kitchen ezetimibe (ZETIA) 10 MG tablet TAKE ONE TABLET BY MOUTH EVERY DAY 90 tablet 3  . hydrochlorothiazide (HYDRODIURIL) 12.5 MG tablet TAKE 1 TABLET(12.5 MG) BY MOUTH DAILY AS NEEDED FOR SWELLING 90 tablet 0  . MAGNESIUM PO Take by mouth.    . meloxicam (MOBIC) 7.5 MG tablet Take 7.5 mg by mouth 2 (two) times daily.  0  . rosuvastatin (CRESTOR) 20 MG tablet TAKE ONE TABLET BY MOUTH EVERY EVENING 90 tablet 3   No current facility-administered medications for this visit.      Allergies:   Lipitor [atorvastatin]; Oxycodone hcl; Protonix [pantoprazole sodium]; Doxycycline; Hydrochlorothiazide; Hydrocodone; Metoprolol; Penicillin g; Tramadol; Trazodone and nefazodone; Zoloft [sertraline]; Augmentin [amoxicillin-pot clavulanate]; Codeine; Demerol; Ivp dye [iodinated diagnostic agents]; and Shellfish allergy   Social History:  The patient  reports that she quit smoking about 21 years ago. Her smoking use included cigarettes. She has a 4.50 pack-year smoking history. she has never used smokeless tobacco. She reports that she does not drink alcohol or use drugs.   Family History:   family history includes Aneurysm in her brother and mother; Birth defects in her brother; Bleeding Disorder in her mother and sister; Cancer in her sister; Cirrhosis in her father; Deep vein thrombosis in her brother; Heart attack in her father; Heart disease in her brother, father, mother, and sister; Hyperlipidemia in her brother, daughter, father, mother, and sister; Hypertension in her brother, daughter, father, mother, other, and sister; Peripheral vascular disease in her daughter; Varicose Veins in her mother and sister.    Review of Systems: Review of Systems   Constitutional: Negative.   Respiratory: Negative.   Cardiovascular: Negative.   Gastrointestinal: Negative.   Musculoskeletal: Positive for back pain.  Neurological: Negative.   Psychiatric/Behavioral: The patient is nervous/anxious and has insomnia.   All other systems reviewed and are negative.    PHYSICAL EXAM: VS:  BP 120/62 (BP Location: Left Arm, Patient Position: Sitting, Cuff Size: Normal)   Pulse (!) 58   Ht 5\' 3"  (1.6 m)   Wt 164 lb 8 oz (74.6 kg)   BMI 29.14 kg/m  , BMI Body mass index is 29.14 kg/m. GEN: Well nourished, well developed, in no acute distress  HEENT: normal  Neck: no JVD, + 2 on the right carotid bruits, or masses Cardiac: RRR; no murmurs, rubs, or gallops,no edema  Respiratory:  clear to auscultation bilaterally, normal work of breathing GI: soft, nontender, nondistended, + BS MS: no deformity or atrophy  Skin: warm and dry, no rash Neuro:  Strength and sensation are intact Psych: euthymic mood, full affect    Recent Labs: 05/24/2017: TSH 0.50 08/04/2017: ALT 13; Brain Natriuretic Peptide 172; BUN 21; Creat 0.81; Hemoglobin 11.1; Platelets 302;  Potassium 5.3; Sodium 142    Lipid Panel Lab Results  Component Value Date   CHOL 127 01/07/2017   HDL 54.70 01/07/2017   LDLCALC 48 01/07/2017   TRIG 119.0 01/07/2017      Wt Readings from Last 3 Encounters:  09/28/17 164 lb 8 oz (74.6 kg)  08/04/17 161 lb 12 oz (73.4 kg)  06/28/17 156 lb 4 oz (70.9 kg)       ASSESSMENT AND PLAN:  Adjustment disorder Loss of several family members, insomnia, trying to sleep in a chair but still not sleeping well. Likely contributing to cardiac arrhythmia, ectopy She does not want extra Coreg or propranolol recommend she needs to work on her sleep. May need additional sleep agents through this.  Carotid stenosis, bilateral -  Stable disease bilaterally , continue aggressive cholesterol management Managed in [place]  Essential hypertension -  No  changes made to the medications.  Blood pressure  stable  Hyperlipidemia, unspecified hyperlipidemia type -  On Crestor and Zetia Cholesterol at goal  Atherosclerosis of coronary artery bypass graft of native heart without angina pectoris Currently with no anginal symptoms. Active with no complaints No further testing at this time.  Stable  Abdominal pain Previous mesenteric ultrasound, flow stable  Leg swelling Most likely venous insufficiency Recommended compression hose.  She is not a fan She can try Lasix as needed but likely will be of little clinical benefit   Total encounter time more than 25 minutes  Greater than 50% was spent in counseling and coordination of care with the patient   Disposition:   F/U  6 months   Orders Placed This Encounter  Procedures  . EKG 12-Lead     Signed, Esmond Plants, M.D., Ph.D. 09/28/2017  [place], [place]

## 2017-09-28 ENCOUNTER — Ambulatory Visit: Payer: Medicare HMO | Admitting: Cardiovascular Disease

## 2017-09-28 ENCOUNTER — Encounter: Payer: Self-pay | Admitting: Cardiovascular Disease

## 2017-09-28 VITALS — BP 120/62 | HR 58 | Ht 63.0 in | Wt 164.5 lb

## 2017-09-28 DIAGNOSIS — I25708 Atherosclerosis of coronary artery bypass graft(s), unspecified, with other forms of angina pectoris: Secondary | ICD-10-CM | POA: Diagnosis not present

## 2017-09-28 DIAGNOSIS — I5033 Acute on chronic diastolic (congestive) heart failure: Secondary | ICD-10-CM | POA: Diagnosis not present

## 2017-09-28 DIAGNOSIS — R0989 Other specified symptoms and signs involving the circulatory and respiratory systems: Secondary | ICD-10-CM

## 2017-09-28 DIAGNOSIS — I6523 Occlusion and stenosis of bilateral carotid arteries: Secondary | ICD-10-CM

## 2017-09-28 DIAGNOSIS — I10 Essential (primary) hypertension: Secondary | ICD-10-CM | POA: Diagnosis not present

## 2017-09-28 DIAGNOSIS — E782 Mixed hyperlipidemia: Secondary | ICD-10-CM

## 2017-09-28 NOTE — Patient Instructions (Signed)
Medication Instructions:   Lasix as needed of leg swelling Compression hose  Labwork:  No new labs needed  Testing/Procedures:  No further testing at this time   Follow-Up: It was a pleasure seeing you in the office today. Please call us if you have new issues that need to be addressed before your next appt.  [phone]  Your physician wants you to follow-up in: 6 months.  You will receive a reminder letter in the mail two months in advance. If you don't receive a letter, please call our office to schedule the follow-up appointment.  If you need a refill on your cardiac medications before your next appointment, please call your pharmacy.

## 2017-10-05 ENCOUNTER — Ambulatory Visit (INDEPENDENT_AMBULATORY_CARE_PROVIDER_SITE_OTHER): Payer: Medicare HMO | Admitting: Family Medicine

## 2017-10-05 ENCOUNTER — Encounter: Payer: Self-pay | Admitting: Family Medicine

## 2017-10-05 DIAGNOSIS — F4321 Adjustment disorder with depressed mood: Secondary | ICD-10-CM

## 2017-10-05 DIAGNOSIS — R69 Illness, unspecified: Secondary | ICD-10-CM | POA: Diagnosis not present

## 2017-10-05 MED ORDER — HYDROXYZINE HCL 10 MG PO TABS: 5.0000 mg | ORAL_TABLET | Freq: Three times a day (TID) | ORAL | 1 refills | Status: DC | PRN

## 2017-10-05 NOTE — Patient Instructions (Signed)
If you need a prescription for compression stockings, then let me know.   Try hydroxyzine as needed for anxiety or sleep let me know if that doesn't help.  Take care.  Glad to see you.

## 2017-10-05 NOTE — Progress Notes (Signed)
She had seen cards recently. Per cards note:   Adjustment disorder Loss of several family members, insomnia, trying to sleep in a chair but still not sleeping well. Likely contributing to cardiac arrhythmia, ectopy She does not want extra Coreg or propranolol recommend she needs to work on her sleep. May need additional sleep agents through this.  =============================================  Tearful, not sleeping.  Her sister died unexpectedly.  Patient found out about it in the middle of the night via Facebook.  This is on top of her brother having died about a year prior.  She couldn't get to the funeral due to her back pain.  She has an ablation schedule for her back pain.  "All of this is going to be better but right not it is not good."  No SI/HI.    She can't get her back procedure done until mid March.    She had used compression stockings prev.  See AVS.   Meds, vitals, and allergies reviewed.   ROS: Per HPI unless specifically indicated in ROS section   GEN: nad, alert and oriented, tearful.   HEENT: mucous membranes moist NECK: supple w/o LA CV: rrr.   PULM: ctab, no inc wob ABD: soft, +bs EXT: trace BLE edema

## 2017-10-07 ENCOUNTER — Telehealth: Payer: Self-pay | Admitting: Family Medicine

## 2017-10-07 DIAGNOSIS — F4321 Adjustment disorder with depressed mood: Secondary | ICD-10-CM | POA: Insufficient documentation

## 2017-10-07 NOTE — Telephone Encounter (Signed)
PA completed thru [facility] for Hydroxyzine, awaiting response.

## 2017-10-07 NOTE — Telephone Encounter (Signed)
Please notify pharmacy, let me know if you need me to do anything.  Thanks.

## 2017-10-07 NOTE — Telephone Encounter (Signed)
Copied from [place]. Topic: Quick Communication - See Telephone Encounter >> Oct 07, 2017 11:40 AM Bennye Alm wrote: CRM for notification. See Telephone encounter for: Patient's husband called and stated that they were waiting for a prior auth to be completed regarding the hydrOXYzine (ATARAX/VISTARIL) 10 MG tablet. [facility] [address] [facility] [place].   10/07/17.

## 2017-10-07 NOTE — Telephone Encounter (Signed)
I have not received an approval notice as of yet but will fax to pharmacy when it is received.  Patient's husband was advised that if he has been told that it is approved, the pharmacy should be able to run it through now even prior to my notification.

## 2017-10-07 NOTE — Assessment & Plan Note (Addendum)
D/w pt about options.  Okay for outpatient f/u.  Supportive husband.  No SI/HI.  Try hydroxyzine as needed for anxiety or sleep let me know if that doesn't help.  She agrees.  Routine cautions given on med.   >25 minutes spent in face to face time with patient, >50% spent in counselling or coordination of care.

## 2017-10-07 NOTE — Telephone Encounter (Signed)
Pt husband Jeneen Rinks states he spoke to Scott and they adv him the pt medication for PA has been approved and would like to know when the [facility] would be [facility].

## 2017-10-18 DIAGNOSIS — J301 Allergic rhinitis due to pollen: Secondary | ICD-10-CM | POA: Diagnosis not present

## 2017-10-18 DIAGNOSIS — J309 Allergic rhinitis, unspecified: Secondary | ICD-10-CM | POA: Diagnosis not present

## 2017-10-18 DIAGNOSIS — H60549 Acute eczematoid otitis externa, unspecified ear: Secondary | ICD-10-CM | POA: Diagnosis not present

## 2017-10-27 ENCOUNTER — Other Ambulatory Visit: Payer: Self-pay | Admitting: Family Medicine

## 2017-10-27 NOTE — Telephone Encounter (Signed)
Electronic refill request Last refill 08/05/17 #90 Last office visit 10/06/17 See allergy/contrainidcation

## 2017-10-28 NOTE — Telephone Encounter (Signed)
Sent. Thanks.   

## 2017-11-09 ENCOUNTER — Other Ambulatory Visit: Payer: Self-pay | Admitting: *Deleted

## 2017-11-09 NOTE — Telephone Encounter (Signed)
Faxed refill request. Hydroxyzine  Requests 90 day supply. Last office visit:   10/05/17 Last Filled:    30 tablet 1 10/05/2017  90 day supply please.

## 2017-11-10 MED ORDER — HYDROXYZINE HCL 10 MG PO TABS: 5.0000 mg | ORAL_TABLET | Freq: Three times a day (TID) | ORAL | 1 refills | Status: DC | PRN

## 2017-11-10 NOTE — Telephone Encounter (Signed)
Sent. Thanks.   

## 2017-11-11 DIAGNOSIS — M47816 Spondylosis without myelopathy or radiculopathy, lumbar region: Secondary | ICD-10-CM | POA: Diagnosis not present

## 2017-11-18 DIAGNOSIS — M47816 Spondylosis without myelopathy or radiculopathy, lumbar region: Secondary | ICD-10-CM | POA: Diagnosis not present

## 2017-11-29 DIAGNOSIS — Z961 Presence of intraocular lens: Secondary | ICD-10-CM | POA: Diagnosis not present

## 2017-11-29 DIAGNOSIS — H52202 Unspecified astigmatism, left eye: Secondary | ICD-10-CM | POA: Diagnosis not present

## 2017-12-02 ENCOUNTER — Other Ambulatory Visit: Payer: Self-pay | Admitting: Family Medicine

## 2017-12-02 NOTE — Telephone Encounter (Signed)
Electronic refill request. Hydroxyzine Last office visit:   10/05/17 Last Filled:    90 tablet 1 11/10/2017  Please advise.

## 2017-12-03 NOTE — Telephone Encounter (Signed)
Sent. Thanks.   

## 2017-12-31 DIAGNOSIS — Z6829 Body mass index (BMI) 29.0-29.9, adult: Secondary | ICD-10-CM | POA: Diagnosis not present

## 2017-12-31 DIAGNOSIS — M47816 Spondylosis without myelopathy or radiculopathy, lumbar region: Secondary | ICD-10-CM | POA: Diagnosis not present

## 2017-12-31 DIAGNOSIS — I10 Essential (primary) hypertension: Secondary | ICD-10-CM | POA: Diagnosis not present

## 2018-01-10 ENCOUNTER — Other Ambulatory Visit: Payer: Self-pay | Admitting: Family Medicine

## 2018-01-10 DIAGNOSIS — E782 Mixed hyperlipidemia: Secondary | ICD-10-CM

## 2018-01-11 ENCOUNTER — Ambulatory Visit (INDEPENDENT_AMBULATORY_CARE_PROVIDER_SITE_OTHER): Payer: Medicare HMO

## 2018-01-11 VITALS — BP 140/82 | HR 69 | Temp 98.4°F | Ht 62.0 in | Wt 158.8 lb

## 2018-01-11 DIAGNOSIS — Z0000 Encounter for general adult medical examination without abnormal findings: Secondary | ICD-10-CM | POA: Diagnosis not present

## 2018-01-11 DIAGNOSIS — E782 Mixed hyperlipidemia: Secondary | ICD-10-CM

## 2018-01-11 LAB — COMPREHENSIVE METABOLIC PANEL
ALT: 20 U/L (ref 0–35)
AST: 18 U/L (ref 0–37)
Albumin: 3.9 g/dL (ref 3.5–5.2)
Alkaline Phosphatase: 68 U/L (ref 39–117)
BUN: 14 mg/dL (ref 6–23)
CO2: 34 mEq/L — ABNORMAL HIGH (ref 19–32)
Calcium: 9.4 mg/dL (ref 8.4–10.5)
Chloride: 99 mEq/L (ref 96–112)
Creatinine, Ser: 0.78 mg/dL (ref 0.40–1.20)
GFR: 75.95 mL/min (ref >60.00)
Glucose, Bld: 121 mg/dL — ABNORMAL HIGH (ref 70–99)
Potassium: 4.6 mEq/L (ref 3.5–5.1)
Sodium: 140 mEq/L (ref 135–145)
Total Bilirubin: 0.6 mg/dL (ref 0.2–1.2)
Total Protein: 6.5 g/dL (ref 6.0–8.3)

## 2018-01-11 LAB — LIPID PANEL
Cholesterol: 161 mg/dL (ref 0–200)
HDL: 62.3 mg/dL (ref >39.00)
LDL Cholesterol: 71 mg/dL (ref 0–99)
NonHDL: 98.56
Total CHOL/HDL Ratio: 3
Triglycerides: 137 mg/dL (ref 0.0–149.0)
VLDL: 27.4 mg/dL (ref 0.0–40.0)

## 2018-01-11 NOTE — Progress Notes (Signed)
Subjective:   Catherine Green is a 77 y.o. female who presents for Medicare Annual (Subsequent) preventive examination.  Review of Systems:  N/A Cardiac Risk Factors include: advanced age (>85men, >64 women);hypertension     Objective:     Vitals: BP 140/82 (BP Location: Right Arm, Patient Position: Sitting, Cuff Size: Normal)   Pulse 69   Temp 98.4 F (36.9 C) (Oral)   Ht 5\' 2"  (1.575 m) Comment: no shoes  Wt 158 lb 12 oz (72 kg)   SpO2 99%   BMI 29.04 kg/m   Body mass index is 29.04 kg/m.  Advanced Directives 01/11/2018 02/17/2017 01/07/2017 02/18/2016 08/20/2015 04/11/2015 07/20/2014  Does Patient Have a Medical Advance Directive? Yes Yes Yes Yes Yes No Yes  Type of Paramedic of [place];Living will [place];Living will [place];Living will Living will;Healthcare Power of [place];Living will - [place];Living will  Does patient want to make changes to medical advance directive? - - - - - - No - Patient declined  Copy of [place] in Chart? No - copy requested - No - copy requested No - copy requested - - No - copy requested  Would patient like information on creating a medical advance directive? - - - - - Yes - Educational materials given -    Tobacco Social History   Tobacco Use  Smoking Status Former Smoker  . Packs/day: 0.30  . Years: 15.00  . Pack years: 4.50  . Types: Cigarettes  . Last attempt to quit: 08/31/1996  . Years since quitting: 21.3  Smokeless Tobacco Never Used     Counseling given: No   Clinical Intake:  Pre-visit preparation completed: Yes  Pain : No/denies pain Pain Score: 0-No pain     Nutritional Status: BMI 25 -29 Overweight Nutritional Risks: None Diabetes: No  How often do you need to have someone help you when you read instructions, pamphlets, or other written materials from your doctor or pharmacy?: 1  - Never What is the last grade level you completed in school?: 11th grade  Interpreter Needed?: No  Comments: pt lives with spouse Information entered by :: LPinson, LPN  Past Medical History:  Diagnosis Date  . Allergy    hay fever  . Anemia   . Anxiety   . Carotid artery occlusion   . Cerebrovascular disease    extracranial; occlusive  . Chicken pox   . Coronary artery disease   . Depression   . Dizziness   . Dizziness   . DVT (deep venous thrombosis) ([place])   . Fainting spell   . GERD (gastroesophageal reflux disease)   . Headache   . Heart murmur   . Hyperlipidemia   . Hypertension   . PONV (postoperative nausea and vomiting)    severe nausea and vomiting  . Pre-syncope   . PVD (peripheral vascular disease) ([place])    endarterectomy by [name]  . Seasonal allergies   . Shortness of breath dyspnea    wth ambulation at times  . Swelling of both ankles    and abdomen; takes Lasix when needed  . Thrombophlebitis    following childbirth  . Ulcer    Past Surgical History:  Procedure Laterality Date  . APPENDECTOMY    . BACK SURGERY  01/14/16  . BREAST SURGERY     saline implants  . CARDIAC CATHETERIZATION    . CAROTID ENDARTERECTOMY  1992  .  CAROTID ENDARTERECTOMY Right February 13, 2005   Re-do Right CE  . CATARACT EXTRACTION, BILATERAL  2013  . CHOLECYSTECTOMY, LAPAROSCOPIC  04/02/14   Dr. Rochel Brome  . CORONARY ARTERY BYPASS GRAFT  2006   x3 Dr. Prescott Gum  . ENDARTERECTOMY Left 07/20/2014   Procedure: ENDARTERECTOMY CAROTID-LEFT;  Surgeon: Rosetta Posner, MD;  Location: [facility];  Service: Vascular;  Laterality: Left;  . EYE SURGERY Bilateral Feb. 2013   Cataract Left eye  . SEPTOPLASTY     with bilateral inferior turbinate reductions  . SPINE SURGERY  12/2015  . TOTAL ABDOMINAL HYSTERECTOMY    . UPPER GASTROINTESTINAL ENDOSCOPY     had polyps removed from esophagus  . UPPER GI ENDOSCOPY     Family History  Problem Relation Age of Onset  . Aneurysm Mother         brain  . Heart disease Mother        Aneyursm   . Hyperlipidemia Mother   . Hypertension Mother   . Varicose Veins Mother   . Bleeding Disorder Mother   . Heart disease Father   . Cirrhosis Father   . Heart attack Father   . Hyperlipidemia Father   . Hypertension Father   . Heart disease Brother        Before age 87  . Aneurysm Brother   . Deep vein thrombosis Brother   . Birth defects Brother   . Hyperlipidemia Brother   . Hypertension Brother   . Hypertension Other   . Cancer Sister        lung  . Heart disease Sister        Aneurysm  . Hyperlipidemia Sister   . Hypertension Sister   . Varicose Veins Sister   . Peripheral vascular disease Daughter   . Hyperlipidemia Daughter   . Hypertension Daughter   . Bleeding Disorder Sister   . Breast cancer Neg Hx   . Colon cancer Neg Hx    Social History   Socioeconomic History  . Marital status: Married    Spouse name: Not on file  . Number of children: Not on file  . Years of education: Not on file  . Highest education level: Not on file  Occupational History  . Not on file  Social Needs  . Financial resource strain: Not on file  . Food insecurity:    Worry: Not on file    Inability: Not on file  . Transportation needs:    Medical: Not on file    Non-medical: Not on file  Tobacco Use  . Smoking status: Former Smoker    Packs/day: 0.30    Years: 15.00    Pack years: 4.50    Types: Cigarettes    Last attempt to quit: 08/31/1996    Years since quitting: 21.3  . Smokeless tobacco: Never Used  Substance and Sexual Activity  . Alcohol use: No    Alcohol/week: 0.0 oz  . Drug use: No  . Sexual activity: Not Currently  Lifestyle  . Physical activity:    Days per week: Not on file    Minutes per session: Not on file  . Stress: Not on file  Relationships  . Social connections:    Talks on phone: Not on file    Gets together: Not on file    Attends religious service: Not on file    Active member of club  or organization: Not on file    Attends meetings of clubs or  organizations: Not on file    Relationship status: Not on file  Other Topics Concern  . Not on file  Social History Narrative   Lives with husband, has 1 daughter. No pets.   Work - retired Emergency planning/management officer   Diet - healthy    Outpatient Encounter Medications as of 01/11/2018  Medication Sig  . aspirin 81 MG tablet Take 162 mg by mouth daily.   . carvedilol (COREG) 3.125 MG tablet TAKE 1 TABLET BY MOUTH TWICE A DAY  . Coenzyme Q10 (CO Q10) 100 MG CAPS Take 100 mg by mouth daily.  Marland Kitchen ezetimibe (ZETIA) 10 MG tablet TAKE ONE TABLET BY MOUTH EVERY DAY  . gabapentin (NEURONTIN) 100 MG capsule TAKE AS PRESCRIBED,TITRATION SCHEDULE PROVIDED IN OFFICE, INCREASE TO MAX OF 3 CAPS 3 TIMES A DAY  . hydrochlorothiazide (HYDRODIURIL) 12.5 MG tablet TAKE 1 TABLET(12.5 MG) BY MOUTH DAILY AS NEEDED FOR SWELLING  . MAGNESIUM PO Take by mouth.  . rosuvastatin (CRESTOR) 20 MG tablet TAKE ONE TABLET BY MOUTH EVERY EVENING  . [DISCONTINUED] hydrOXYzine (ATARAX/VISTARIL) 10 MG tablet TAKE 0.5-1 TABLETS BY MOUTH 3 TIMES DAILY AS NEEDED FOR ANXIETY (OR FOR SLEEP).  . [DISCONTINUED] meloxicam (MOBIC) 7.5 MG tablet Take 7.5 mg by mouth 2 (two) times daily.  . hydrOXYzine (ATARAX/VISTARIL) 10 MG tablet Take 0.5-1 tablets (5-10 mg total) by mouth 3 (three) times daily as needed for anxiety (or for sleep). (Patient not taking: Reported on 01/11/2018)   No facility-administered encounter medications on file as of 01/11/2018.     Activities of Daily Living In your present state of health, do you have any difficulty performing the following activities: 01/11/2018  Hearing? N  Vision? N  Difficulty concentrating or making decisions? N  Walking or climbing stairs? N  Dressing or bathing? N  Doing errands, shopping? N  Preparing Food and eating ? N  Using the Toilet? N  In the past six months, have you accidently leaked urine? N  Do you have problems with loss  of bowel control? N  Managing your Medications? N  Managing your Finances? N  Housekeeping or managing your Housekeeping? N  Some recent data might be hidden    Patient Care Team: Tonia Ghent, MD as PCP - General (Family Medicine) Minna Merritts, MD (Cardiology) Earnie Larsson, MD as Consulting Physician (Neurosurgery)    Assessment:   This is a routine wellness examination for [place].  Exercise Activities and Dietary recommendations Current Exercise Habits: The patient does not participate in regular exercise at present, Exercise limited by: orthopedic condition(s)  Goals    . Patient Stated     Starting 01/11/2018, I will continue to take medication as prescribed.        Fall Risk Fall Risk  01/11/2018 01/07/2017  Falls in the past year? No No    Depression Screen PHQ 2/9 Scores 01/11/2018 01/07/2017  PHQ - 2 Score 0 6  PHQ- 9 Score 0 15     Cognitive Function MMSE - Mini Mental State Exam 01/11/2018 01/07/2017  Orientation to time 5 5  Orientation to Place 5 5  Registration 3 3  Attention/ Calculation 0 0  Recall 3 3  Language- name 2 objects 0 0  Language- repeat 1 1  Language- follow 3 step command 3 3  Language- read & follow direction 0 0  Write a sentence 0 0  Copy design 0 0  Total score 20 20     PLEASE NOTE: A Mini-Cog screen  was completed. Maximum score is 20. A value of 0 denotes this part of [place] MMSE was not completed or the patient failed this part of the Mini-Cog screening.   Mini-Cog Screening Orientation to Time - Max 5 pts Orientation to Place - Max 5 pts Registration - Max 3 pts Recall - Max 3 pts Language Repeat - Max 1 pts Language Follow 3 Step Command - Max 3 pts     Immunization History  Administered Date(s) Administered  . Influenza Split 05/27/2012  . Influenza Whole 06/20/2013  . Influenza, Seasonal, Injecte, Preservative Fre 06/24/2016  . Influenza,inj,Quad PF,6+ Mos 06/14/2014, 06/07/2015, 05/24/2017  . Pneumococcal  Conjugate-13 01/07/2017  . Pneumococcal Polysaccharide-23 03/11/2011  . Tdap 05/27/2012  . Zoster 03/11/2011    Screening Tests Health Maintenance  Topic Date Due  . MAMMOGRAM  01/07/2026 (Originally 03/10/2012)  . DEXA SCAN  01/07/2026 (Originally 03/12/2005)  . INFLUENZA VACCINE  03/31/2018  . TETANUS/TDAP  05/27/2022  . PNA vac Low Risk Adult  Completed       Plan:      I have personally reviewed, addressed, and noted the following in the patient's chart:  A. Medical and social history B. Use of alcohol, tobacco or illicit drugs  C. Current medications and supplements D. Functional ability and status E.  Nutritional status F.  Physical activity G. Advance directives H. List of other physicians I.  Hospitalizations, surgeries, and ER visits in previous 12 months J.  [place] to include hearing, vision, cognitive, depression L. Referrals and appointments - none  In addition, I have reviewed and discussed with patient certain preventive protocols, quality metrics, and best practice recommendations. A written personalized care plan for preventive services as well as general preventive health recommendations were provided to patient.  See attached scanned questionnaire for additional information.   Signed,   Lindell Noe, MHA, BS, LPN Health Coach

## 2018-01-11 NOTE — Progress Notes (Signed)
PCP notes:   Health maintenance:  No gaps identified.  Abnormal screenings:   Hearing - failed  Hearing Screening   125Hz  250Hz  500Hz  1000Hz  2000Hz  3000Hz  4000Hz  6000Hz  8000Hz   Right ear:   0 0 0  0    Left ear:   0 0 0  0     Patient concerns:   None  Nurse concerns:  None  Next PCP appt:   01/18/18 @ 0845  I reviewed health advisor's note, was available for consultation on the day of service listed in this note, and agree with documentation and plan. Elsie Stain, MD.

## 2018-01-11 NOTE — Patient Instructions (Signed)
Catherine Green , Thank you for taking time to come for your Medicare Wellness Visit. I appreciate your ongoing commitment to your health goals. Please review the following plan we discussed and let me know if I can assist you in the future.   These are the goals we discussed: Goals    . Patient Stated     Starting 01/11/2018, I will continue to take medication as prescribed.        This is a list of the screening recommended for you and due dates:  Health Maintenance  Topic Date Due  . Mammogram  01/07/2026*  . DEXA scan (bone density measurement)  01/07/2026*  . Flu Shot  03/31/2018  . Tetanus Vaccine  05/27/2022  . Pneumonia vaccines  Completed  *Topic was postponed. The date shown is not the original due date.   Preventive Care for Adults  A healthy lifestyle and preventive care can promote health and wellness. Preventive health guidelines for adults include the following key practices.  . A routine yearly physical is a good way to check with your health care provider about your health and preventive screening. It is a chance to share any concerns and updates on your health and to receive a thorough exam.  . Visit your dentist for a routine exam and preventive care every 6 months. Brush your teeth twice a day and floss once a day. Good oral hygiene prevents tooth decay and gum disease.  . The frequency of eye exams is based on your age, health, family medical history, use  of contact lenses, and other factors. Follow your health care provider's recommendations for frequency of eye exams.  . Eat a healthy diet. Foods like vegetables, fruits, whole grains, low-fat dairy products, and lean protein foods contain the nutrients you need without too many calories. Decrease your intake of foods high in solid fats, added sugars, and salt. Eat the right amount of calories for you. Get information about a proper diet from your health care provider, if necessary.  . Regular physical exercise is  one of the most important things you can do for your health. Most adults should get at least 150 minutes of moderate-intensity exercise (any activity that increases your heart rate and causes you to sweat) each week. In addition, most adults need muscle-strengthening exercises on 2 or more days a week.  Silver Sneakers may be a benefit available to you. To determine eligibility, you may visit the website: www.silversneakers.com or contact program at 9044631976 Mon-Fri between 8AM-8PM.   . Maintain a healthy weight. The body mass index (BMI) is a screening tool to identify possible weight problems. It provides an estimate of body fat based on height and weight. Your health care provider can find your BMI and can help you achieve or maintain a healthy weight.   For adults 20 years and older: ? A BMI below 18.5 is considered underweight. ? A BMI of 18.5 to 24.9 is normal. ? A BMI of 25 to 29.9 is considered overweight. ? A BMI of 30 and above is considered obese.   . Maintain normal blood lipids and cholesterol levels by exercising and minimizing your intake of saturated fat. Eat a balanced diet with plenty of fruit and vegetables. Blood tests for lipids and cholesterol should begin at age 67 and be repeated every 5 years. If your lipid or cholesterol levels are high, you are over 50, or you are at high risk for heart disease, you may need your cholesterol levels  checked more frequently. Ongoing high lipid and cholesterol levels should be treated with medicines if diet and exercise are not working.  . If you smoke, find out from your health care provider how to quit. If you do not use tobacco, please do not start.  . If you choose to drink alcohol, please do not consume more than 2 drinks per day. One drink is considered to be 12 ounces (355 mL) of beer, 5 ounces (148 mL) of wine, or 1.5 ounces (44 mL) of liquor.  . If you are 64-59 years old, ask your health care provider if you should take  aspirin to prevent strokes.  . Use sunscreen. Apply sunscreen liberally and repeatedly throughout the day. You should seek shade when your shadow is shorter than you. Protect yourself by wearing long sleeves, pants, a wide-brimmed hat, and sunglasses year round, whenever you are outdoors.  . Once a month, do a whole body skin exam, using a mirror to look at the skin on your back. Tell your health care provider of new moles, moles that have irregular borders, moles that are larger than a pencil eraser, or moles that have changed in shape or color.

## 2018-01-18 ENCOUNTER — Ambulatory Visit (INDEPENDENT_AMBULATORY_CARE_PROVIDER_SITE_OTHER): Payer: Medicare HMO | Admitting: Family Medicine

## 2018-01-18 ENCOUNTER — Encounter: Payer: Self-pay | Admitting: Family Medicine

## 2018-01-18 VITALS — BP 122/78 | HR 58 | Temp 98.5°F | Ht 62.0 in | Wt 161.2 lb

## 2018-01-18 DIAGNOSIS — F419 Anxiety disorder, unspecified: Secondary | ICD-10-CM | POA: Diagnosis not present

## 2018-01-18 DIAGNOSIS — M545 Low back pain, unspecified: Secondary | ICD-10-CM

## 2018-01-18 DIAGNOSIS — I739 Peripheral vascular disease, unspecified: Secondary | ICD-10-CM

## 2018-01-18 DIAGNOSIS — L989 Disorder of the skin and subcutaneous tissue, unspecified: Secondary | ICD-10-CM

## 2018-01-18 DIAGNOSIS — E782 Mixed hyperlipidemia: Secondary | ICD-10-CM | POA: Diagnosis not present

## 2018-01-18 DIAGNOSIS — Z0000 Encounter for general adult medical examination without abnormal findings: Secondary | ICD-10-CM

## 2018-01-18 DIAGNOSIS — R739 Hyperglycemia, unspecified: Secondary | ICD-10-CM | POA: Diagnosis not present

## 2018-01-18 DIAGNOSIS — I779 Disorder of arteries and arterioles, unspecified: Secondary | ICD-10-CM | POA: Diagnosis not present

## 2018-01-18 DIAGNOSIS — Z7189 Other specified counseling: Secondary | ICD-10-CM

## 2018-01-18 DIAGNOSIS — R69 Illness, unspecified: Secondary | ICD-10-CM | POA: Diagnosis not present

## 2018-01-18 DIAGNOSIS — I10 Essential (primary) hypertension: Secondary | ICD-10-CM | POA: Diagnosis not present

## 2018-01-18 MED ORDER — HYDROCHLOROTHIAZIDE 12.5 MG PO TABS: 12.5000 mg | ORAL_TABLET | Freq: Every day | ORAL | 3 refills | Status: DC

## 2018-01-18 MED ORDER — DIAZEPAM 2 MG PO TABS: 1.0000 mg | ORAL_TABLET | Freq: Every day | ORAL | 0 refills | Status: DC | PRN

## 2018-01-18 NOTE — Patient Instructions (Signed)
[facility] [place] [facility] and [facility] get some [facility].  Cut back on sugars and sweets in the meantime.  Recheck in about 3 months.  We can do labs at the visit- you don't have to fast.  Use valium if needed if you have a bad episode with anxiety.  Use the least amount possible and only when needed.  Take care.  Glad to see you.

## 2018-01-18 NOTE — Progress Notes (Signed)
Hearing screen failed.  D/w pt.  Declined hearing aids.   Declined mammogram.  She wouldn't want intervention if abnormal at this point.  DXA declined.   Husband is designated if patient were incapacitated.   Colonoscopy 2013  Her pain is better and she is off gabapentin.  The ablation helped and she was relieved about that.    Hyperglycemia d/w pt.  See plan.   Elevated Cholesterol: Using medications without problems:yes Muscle aches:  Some occ cramping but tolerable.   Diet compliance: usually good when not on vacation.   Exercise: encouraged as tolerated.   She is back on zetia and crestor but had been off diet while travelling.    Hypertension:    Using medication without problems or lightheadedness: yes Chest pain with exertion:no Edema:no Short of breath: attributed to relative deconditioning.   Labs d/w pt.    She has vascular f/u pending.  No vision loss.  No motor changes.  No change in sensation.  No new focal neuro changes.    Her sister had died and she didn't want to talk a lot about it at this point.  She didn't tolerate hydroxyzine.  She attributed some nausea and bloating to "nerves".  No blood in stool. She has sig anxiety and was asking about options.  See plan.  No SI/HI.    PMH and SH reviewed  ROS: Per HPI unless specifically indicated in ROS section   Meds, vitals, and allergies reviewed.   GEN: nad, alert and oriented HEENT: mucous membranes moist NECK: supple w/o LA, B carotid bruit noted.  CV: rrr PULM: ctab, no inc wob ABD: soft, +bs EXT: no edema SKIN: no acute rash but she has actinic changes and d/w pt about f/u with derm.

## 2018-01-19 DIAGNOSIS — Z7189 Other specified counseling: Secondary | ICD-10-CM | POA: Insufficient documentation

## 2018-01-19 DIAGNOSIS — Z0000 Encounter for general adult medical examination without abnormal findings: Secondary | ICD-10-CM | POA: Insufficient documentation

## 2018-01-19 DIAGNOSIS — R739 Hyperglycemia, unspecified: Secondary | ICD-10-CM | POA: Insufficient documentation

## 2018-01-19 NOTE — Assessment & Plan Note (Signed)
Refer to derm  ?

## 2018-01-19 NOTE — Assessment & Plan Note (Signed)
She basically needs an as needed medication to use when she has a really bad episode of anxiety.  Hydroxyzine did not help.  She can use a low-dose of Valium as needed.  She had tolerated the medication before per her report.  Routine benzodiazepine cautions given to patient.  Update me as needed.  No suicidal or homicidal intent.

## 2018-01-19 NOTE — Assessment & Plan Note (Signed)
Labs discussed with patient.  Advised to cut back on high carbohydrate foods.  Recheck A1c in about 3 months.  All discussed with patient.

## 2018-01-19 NOTE — Assessment & Plan Note (Signed)
Husband is designated if patient were incapacitated.    

## 2018-01-19 NOTE — Assessment & Plan Note (Signed)
Significantly improved after ablation and she is relieved about that.

## 2018-01-19 NOTE — Assessment & Plan Note (Signed)
Labs discussed with patient.  She is back on Zetia and Crestor but had been off of her diet recently.  She will restart her diet.  We can recheck periodically.

## 2018-01-19 NOTE — Assessment & Plan Note (Signed)
She has vascular follow-up pending.  I will defer.  No focal changes.

## 2018-01-19 NOTE — Assessment & Plan Note (Signed)
Hearing screen failed.  D/w pt.  Declined hearing aids.   Declined mammogram.  She wouldn't want intervention if abnormal at this point.  DXA declined.   Husband is designated if patient were incapacitated.   Colonoscopy 2013

## 2018-01-19 NOTE — Assessment & Plan Note (Addendum)
No change in meds.  Continue as is.  Labs discussed with patient.  She agrees. >25 minutes spent in face to face time with patient, >50% spent in counselling or coordination of care, discussing high blood pressure, hyperlipidemia, elevated blood sugar, anxiety, etc.

## 2018-02-01 DIAGNOSIS — R69 Illness, unspecified: Secondary | ICD-10-CM | POA: Diagnosis not present

## 2018-02-04 DIAGNOSIS — D485 Neoplasm of uncertain behavior of skin: Secondary | ICD-10-CM | POA: Diagnosis not present

## 2018-02-04 DIAGNOSIS — D1722 Benign lipomatous neoplasm of skin and subcutaneous tissue of left arm: Secondary | ICD-10-CM | POA: Diagnosis not present

## 2018-02-04 DIAGNOSIS — L818 Other specified disorders of pigmentation: Secondary | ICD-10-CM | POA: Diagnosis not present

## 2018-02-04 DIAGNOSIS — D2262 Melanocytic nevi of left upper limb, including shoulder: Secondary | ICD-10-CM | POA: Diagnosis not present

## 2018-02-04 DIAGNOSIS — L570 Actinic keratosis: Secondary | ICD-10-CM | POA: Diagnosis not present

## 2018-02-04 DIAGNOSIS — X32XXXA Exposure to sunlight, initial encounter: Secondary | ICD-10-CM | POA: Diagnosis not present

## 2018-02-04 DIAGNOSIS — L821 Other seborrheic keratosis: Secondary | ICD-10-CM | POA: Diagnosis not present

## 2018-02-14 ENCOUNTER — Other Ambulatory Visit: Payer: Self-pay | Admitting: Cardiovascular Disease

## 2018-02-18 DIAGNOSIS — I10 Essential (primary) hypertension: Secondary | ICD-10-CM | POA: Diagnosis not present

## 2018-02-18 DIAGNOSIS — M47816 Spondylosis without myelopathy or radiculopathy, lumbar region: Secondary | ICD-10-CM | POA: Diagnosis not present

## 2018-02-18 DIAGNOSIS — Z6829 Body mass index (BMI) 29.0-29.9, adult: Secondary | ICD-10-CM | POA: Diagnosis not present

## 2018-02-22 ENCOUNTER — Ambulatory Visit: Payer: Medicare HMO | Admitting: Family

## 2018-02-22 ENCOUNTER — Encounter: Payer: Self-pay | Admitting: Family

## 2018-02-22 ENCOUNTER — Ambulatory Visit (HOSPITAL_COMMUNITY)
Admission: RE | Admit: 2018-02-22 | Discharge: 2018-02-22 | Disposition: A | Discharge status: Home / Self Care | Payer: Medicare HMO | Source: Ambulatory Visit | Attending: Family | Admitting: Family

## 2018-02-22 VITALS — BP 145/54 | HR 62 | Temp 97.7°F | Resp 18 | Ht 62.0 in | Wt 160.0 lb

## 2018-02-22 DIAGNOSIS — I6523 Occlusion and stenosis of bilateral carotid arteries: Secondary | ICD-10-CM

## 2018-02-22 DIAGNOSIS — Z9889 Other specified postprocedural states: Secondary | ICD-10-CM

## 2018-02-22 NOTE — Progress Notes (Signed)
Chief Complaint: Follow up Extracranial Carotid Artery Stenosis   History of Present Illness  Catherine Green is a 77 y.o. female who is s/p right endarterectomy in 1992 and a redo in 2006. She also underwent left endarterectomy in 2015 by [name].   She has undergone back surgery for degenerative disc disease; states she has more back problems, states she will be seeing [name].   She denies any known history of stroke or TIA. Specifically she deniesa history of amaurosis fugax or monocular blindness, unilateral facial drooping, hemiplegia, orreceptive or expressive aphasia.    Her walking is limited by back pain.   [name] last evaluated pt on 02-18-16. At that time carotid duplex demonstrated left internal carotid artery stenosis in the 40-59% whereas her last data interpretation was in the 60-79% range. Right endarterectomy site showed no evidence of stenosis. [name] advised her to follow up in 6 months for repeat carotid duplex.  Pt states he blood pressure at home "stays low, but shoots up in a doctor's office".   Pt Diabetic: no Pt smoker: former smoker, quit about 1998, started in her 20's  Pt meds include: Statin : yes ASA: yes Other anticoagulants/antiplatelets: no   Past Medical History:  Diagnosis Date  . Allergy    hay fever  . Anemia   . Anxiety   . Carotid artery occlusion   . Cerebrovascular disease    extracranial; occlusive  . Chicken pox   . Coronary artery disease   . Depression   . Dizziness   . Dizziness   . DVT (deep venous thrombosis) ([place])   . Fainting spell   . GERD (gastroesophageal reflux disease)   . Headache   . Heart murmur   . Hyperlipidemia   . Hypertension   . PONV (postoperative nausea and vomiting)    severe nausea and vomiting  . Pre-syncope   . PVD (peripheral vascular disease) ([place])    endarterectomy by [name]  . Seasonal allergies   . Shortness of breath dyspnea    wth ambulation at times  . Swelling  of both ankles    and abdomen; takes Lasix when needed  . Thrombophlebitis    following childbirth  . Ulcer     Social History Social History   Tobacco Use  . Smoking status: Former Smoker    Packs/day: 0.30    Years: 15.00    Pack years: 4.50    Types: Cigarettes    Last attempt to quit: 08/31/1996    Years since quitting: 21.4  . Smokeless tobacco: Never Used  Substance Use Topics  . Alcohol use: No    Alcohol/week: 0.0 oz  . Drug use: No    Family History Family History  Problem Relation Age of Onset  . Aneurysm Mother        brain  . Heart disease Mother        Aneyursm   . Hyperlipidemia Mother   . Hypertension Mother   . Varicose Veins Mother   . Bleeding Disorder Mother   . Heart disease Father   . Cirrhosis Father   . Heart attack Father   . Hyperlipidemia Father   . Hypertension Father   . Heart disease Brother        Before age 42  . Aneurysm Brother   . Deep vein thrombosis Brother   . Birth defects Brother   . Hyperlipidemia Brother   . Hypertension Brother   . Hypertension Other   .  Cancer Sister        lung  . Heart disease Sister        Aneurysm  . Hyperlipidemia Sister   . Hypertension Sister   . Varicose Veins Sister   . Peripheral vascular disease Daughter   . Hyperlipidemia Daughter   . Hypertension Daughter   . Bleeding Disorder Sister   . Breast cancer Neg Hx   . Colon cancer Neg Hx     Surgical History Past Surgical History:  Procedure Laterality Date  . APPENDECTOMY    . BACK SURGERY  01/14/16  . BREAST SURGERY     saline implants  . CARDIAC CATHETERIZATION    . CAROTID ENDARTERECTOMY  1992  . CAROTID ENDARTERECTOMY Right February 13, 2005   Re-do Right CE  . CATARACT EXTRACTION, BILATERAL  2013  . CHOLECYSTECTOMY, LAPAROSCOPIC  04/02/14   Dr. Rochel Brome  . CORONARY ARTERY BYPASS GRAFT  2006   x3 Dr. Prescott Gum  . ENDARTERECTOMY Left 07/20/2014   Procedure: ENDARTERECTOMY CAROTID-LEFT;  Surgeon: Rosetta Posner, MD;   Location: [facility];  Service: Vascular;  Laterality: Left;  . EYE SURGERY Bilateral Feb. 2013   Cataract Left eye  . SEPTOPLASTY     with bilateral inferior turbinate reductions  . SPINE SURGERY  12/2015  . TOTAL ABDOMINAL HYSTERECTOMY    . UPPER GASTROINTESTINAL ENDOSCOPY     had polyps removed from esophagus  . UPPER GI ENDOSCOPY      Allergies  Allergen Reactions  . Lipitor [Atorvastatin] Nausea And Vomiting  . Oxycodone Hcl Other (See Comments)    Hallucinations   . Protonix [Pantoprazole Sodium] Nausea Only  . Doxycycline Other (See Comments)    weakness  . Hydrochlorothiazide Other (See Comments)    Not an allergy but urinary frequency.    . Hydrocodone Other (See Comments)    hallucinate  . Hydroxyzine Other (See Comments)    Excessive sweating.   . Metoprolol Other (See Comments)    Slowed body down, per pt   . Penicillin G Nausea Only and Other (See Comments)    N&V  . Tramadol Other (See Comments)    Sedation   . Trazodone And Nefazodone Other (See Comments)    Sedation   . Zoloft [Sertraline] Other (See Comments)    Excessive sweating  . Augmentin [Amoxicillin-Pot Clavulanate] Nausea And Vomiting  . Codeine Nausea And Vomiting and Other (See Comments)  . Demerol Nausea And Vomiting  . Ivp Dye [Iodinated Diagnostic Agents] Hives and Itching       . Shellfish Allergy Hives and Itching    Current Outpatient Medications  Medication Sig Dispense Refill  . aspirin 81 MG tablet Take 162 mg by mouth daily.     . carvedilol (COREG) 3.125 MG tablet TAKE 1 TABLET BY MOUTH TWICE A DAY 180 tablet 0  . Coenzyme Q10 (CO Q10) 100 MG CAPS Take 100 mg by mouth daily.    . diazepam (VALIUM) 2 MG tablet Take 0.5-1 tablets (1-2 mg total) by mouth daily as needed for anxiety (sedation caution). 20 tablet 0  . ezetimibe (ZETIA) 10 MG tablet TAKE ONE TABLET BY MOUTH EVERY DAY 90 tablet 3  . guaiFENesin (MUCINEX) 600 MG 12 hr tablet Take 600 mg by mouth 2 (two) times daily as  needed.    . hydrochlorothiazide (HYDRODIURIL) 12.5 MG tablet Take 1 tablet (12.5 mg total) by mouth daily. 90 tablet 3  . MAGNESIUM PO Take by mouth.    Marland Kitchen  meclizine (ANTIVERT) 25 MG tablet Take 25 mg by mouth daily as needed for dizziness.    . ranitidine (ZANTAC) 150 MG tablet Take 150 mg by mouth 2 (two) times daily.    . rosuvastatin (CRESTOR) 20 MG tablet TAKE ONE TABLET BY MOUTH EVERY EVENING 90 tablet 3   No current facility-administered medications for this visit.     Review of Systems : See HPI for pertinent positives and negatives.  Physical Examination  Vitals:   02/22/18 1156 02/22/18 1158  BP: (!) 145/64 (!) 145/54  Pulse: 61 62  Resp: 18   Temp: 97.7 F (36.5 C)   TempSrc: Oral   SpO2: 99%   Weight: 160 lb (72.6 kg)   Height: 5\' 2"  (1.575 m)    Body mass index is 29.26 kg/m.  General: WDWN female in NAD GAIT: normal Eyes: PERRLA Pulmonary:  Respirations are non-labored, good air movement in all fields, CTAB, no rales, rhonchi, or wheezing. Cardiac: regular rhythm and rate, +murmur.  VASCULAR EXAM Carotid Bruits Right Left   Positive Positive     Abdominal aortic pulse is not palpable. Radial pulses are 2+ palpable and equal.                                                                                                                                          LE Pulses Right Left       POPLITEAL  not palpable   not palpable       POSTERIOR TIBIAL  not palpable   not palpable        DORSALIS PEDIS      ANTERIOR TIBIAL 2+ palpable  2+ palpable     Gastrointestinal: soft, nontender, BS WNL, no r/g, no palpable masses. Musculoskeletal: No muscle atrophy/wasting. M/S 5/5 throughout, extremities without ischemic changes. Neurologic:  A&O X 3; appropriate affect, sensation is normal; speech is normal, CN 2-12 intact, pain and light touch intact in extremities, motor exam as listed above. Skin: No rashes, no ulcers, no cellulitis.    Psychiatric: Normal thought content, mood appropriate to clinical situation.    Assessment: Catherine Green is a 77 y.o. female who is s/p right endarterectomy in 1992 and a redo in 2006. She also underwent left endarterectomy in 2015.  She has no history of stroke or TIA.   Her atherosclerotic risk factors include 27 years smoking history, quit in 1998. Fortunately she does not have DM. Her walking is limited by exacerbation of lumbar spine issues and pain.   DATA Carotid Duplex (02-22-18): Right ICA: 1-39% stenosis (CEA site) Left ICA: 40-59% stenosis (CEA site) No significant stenosis of the left ECA or bilateral CCA. Known occlusion of the right ECA origin with reconstitution via collaterals.  Bilateral vertebral artery flow is antegrade.  Bilateral subclavian artery waveforms are normal.  No significant change compared to the exams on 08-18-16 and 02-17-17.  She had normal ABI's in 2016 with bi and triphasic waveforms.    Plan: Follow-up in 1 year with Carotid Duplex scan.    I discussed in depth with the patient the nature of atherosclerosis, and emphasized the importance of maximal medical management including strict control of blood pressure, blood glucose, and lipid levels, obtaining regular exercise, and continued cessation of smoking.  The patient is aware that without maximal medical management the underlying atherosclerotic disease process will progress, limiting the benefit of any interventions. The patient was given information about stroke prevention and what symptoms should prompt the patient to seek immediate medical care. Thank you for allowing [place] to participate in this patient's care.  Clemon Chambers, RN, MSN, FNP-C [facility] Office: [phone]  Clinic Physician: Early  02/22/18 12:15 PM

## 2018-02-22 NOTE — Patient Instructions (Signed)

## 2018-03-08 ENCOUNTER — Encounter: Payer: Self-pay | Admitting: Family Medicine

## 2018-03-08 ENCOUNTER — Ambulatory Visit (INDEPENDENT_AMBULATORY_CARE_PROVIDER_SITE_OTHER): Payer: Medicare HMO | Admitting: Family Medicine

## 2018-03-08 DIAGNOSIS — M48061 Spinal stenosis, lumbar region without neurogenic claudication: Secondary | ICD-10-CM

## 2018-03-08 MED ORDER — PREGABALIN 25 MG PO CAPS: ORAL_CAPSULE | ORAL | 1 refills | Status: DC

## 2018-03-08 NOTE — Patient Instructions (Addendum)
Take 1 gabapentin tonight and then stop the medicine.   Start lyrica tomorrow.  Start with 1 tab a day for 3 days then take 1 tab twice a day. You may need to increase the dose higher but start with that.  Please update the spine clinic in the meantime.  Update me as needed.  Take care.  Glad to see you.

## 2018-03-08 NOTE — Progress Notes (Signed)
S/p ablation w/o sig relief of back pain  B lower back pain, can be L vs right, at waistline.  She has more leg pain.  She failed tx with gabapentin due to sedation.  She had the sensation of irritation w/o rash with lidocaine patch use, it only helped the pain minimally.  Aspercreme application caused more irritation locally.  Episodic electrical shock in the legs, can be in either leg, can be in individual leg.  When she has severe pain, she can't walk due to the pain.   When the pain is worst, she has sweats on the L side of her body.   Pain got worse since last spine clinic OV, after a gradual return to exercise.  No trauma.   Meds, vitals, and allergies reviewed.   ROS: Per HPI unless specifically indicated in ROS section   nad ncat rrr ctab abd soft, not ttp Back w/o midline pain but B lower back slightly ttp w/o rash.  Can still bear weight.  S/S wnl grossly on BLE  More pain and pressure in lower back with standing.  Better sitting.   B SLR pos but more pain with L SLR (tested while sitting).  Normal foot dorsiflexion and plantarflexion B.

## 2018-03-08 NOTE — Assessment & Plan Note (Signed)
>  25 minutes spent in face to face time with patient, >50% spent in counselling or coordination of care.  Still okay for outpatient f/u.  She has gait changes related to pain with limp but no true new weakness.   Take 1 gabapentin tonight and then stop.  Start lyrica tomorrow.  Start with 1 tab a day for 3 days then take 1 tab twice a day. She may need to increase the dose higher but will start with that.  I asked her to update the spine clinic in the meantime.  Update me as needed.  She agrees.

## 2018-03-14 ENCOUNTER — Telehealth: Payer: Self-pay | Admitting: Family Medicine

## 2018-03-14 MED ORDER — PREGABALIN 25 MG PO CAPS: ORAL_CAPSULE | ORAL | Status: DC

## 2018-03-14 NOTE — Telephone Encounter (Signed)
Patient advised.

## 2018-03-14 NOTE — Telephone Encounter (Signed)
Copied from [place] [id number]. Topic: Quick Communication - See Telephone Encounter >> Mar 14, 2018  1:31 PM Synthia Innocent wrote: CRM for notification. See Telephone encounter for: 03/14/18. Patient calling wanted to let Dr Damita Dunnings know that pregabalin (LYRICA) 25 MG capsule is somewhat working, no back but it still having upper leg pain

## 2018-03-14 NOTE — Telephone Encounter (Signed)
Pt was seen 03/08/18 and pt is presently taking Lyrica 25 mg bid. Back pain is better but cannot tell a lot of improvement in both upper legs. When pain starts in upper legs there is numbness in upper legs to knees; pain is not sharp or dull but feels like and electrical shock to pt. If pt rides in car from [place] to [place] when she gets out of car she has to stand for several mins before she can walk due to upper legs being numb. Pt has called the spine clinic but has not received a cb yet. [facility]. Pt request cb to see what Dr Damita Dunnings suggest.

## 2018-03-14 NOTE — Telephone Encounter (Signed)
Would try small inc in lyrica dose, 2 tabs in the AM and 1 tab in the PM.  This is assuming the spine clinic doesn't offer other advice.  If they do, then I'll defer to them.  I need her to get input from spine clinic o/w.  Thanks.

## 2018-03-18 ENCOUNTER — Telehealth: Payer: Self-pay | Admitting: Cardiovascular Disease

## 2018-03-18 ENCOUNTER — Ambulatory Visit: Payer: Self-pay | Admitting: Family Medicine

## 2018-03-18 NOTE — Telephone Encounter (Signed)
Patient's husband advised (DPR) in the patient's absence.

## 2018-03-18 NOTE — Telephone Encounter (Signed)
Spoke to pt who states she is currently on the phone with Dr Donivan Scull office for advice

## 2018-03-18 NOTE — Telephone Encounter (Signed)
Spoke with patient and she reports swelling to bilateral feet, shortness of breath, some chest tightness, and just not feeling good. She reports that last week she was changed from Neurontin to Lyrica and since that change was made she has just not felt well at all. These side effects are listed. Instructed her to hold her Lyrica to see if her symptoms resolve and to contact PCP office as well. Instructed her to wear compression hose/socks as well. Advised that I would send this over to her provider for review. She verbalized understanding of our conversation, agreement with plan, and had no further questions at this time. Will route to her provider as well for review.

## 2018-03-18 NOTE — Telephone Encounter (Signed)
This could be from the lyrica.  Stop it.   If she continues to have CP then go to ER.  If sx resolve off lyrica, have her f/u with the spine clinic about pain med/intervention options.   Thanks.

## 2018-03-18 NOTE — Telephone Encounter (Signed)
Pt called with new onset of bilateral lower leg and ankle edema. Pt swelling is moderate. Denies redness, chest pain or difficulty breathing. Pt stated that it does hurt when she presses on the swelling. Pt stated that she thinks the Lyrica is causing the edema. Pt has stopped the Lyrica.  Pt stated that yesterday she felt like her chest was full of fluid. Pt stated that she has anxiety attacks and she didn't take her nerve pill yesterday.  Pt has a h/o blood clot in there leg after the birth of her child. Care advice given and pt verbalized understanding. Informed pt that she needs to be seen by a provider asap but pt refused to be seen. Offered to check with other [place] practices but pt refused. Pt stated that she only wanted to see Dr Damita Dunnings.  Pt stated that she had a cardiologist and she was just seen and pt stated that the doctor said everything looked good. Pt saw Esmond Plants MD (cardiologist ) 09/28/17. Asked pt to call cardiologist for advice as well. Advised pt to call back if worsens, if she has difficulty breathing or has chest pain.    Reason for Disposition . [1] MODERATE leg swelling (e.g., swelling extends up to knees) AND [2] new onset or worsening  Answer Assessment - Initial Assessment Questions 1. ONSET: "When did the swelling start?" (e.g., minutes, hours, days)     Tuesday or Wednesday 2. LOCATION: "What part of the leg is swollen?"  "Are both legs swollen or just one leg?"     Above knee down to ankles 3. SEVERITY: "How bad is the swelling?" (e.g., localized; mild, moderate, severe)  - Localized - small area of swelling localized to one leg  - MILD pedal edema - swelling limited to foot and ankle, pitting edema < 1/4 inch (6 mm) deep, rest and elevation eliminate most or all swelling  - MODERATE edema - swelling of lower leg to knee, pitting edema > 1/4 inch (6 mm) deep, rest and elevation only partially reduce swelling  - SEVERE edema - swelling extends above knee, facial or  hand swelling present      moderate 4. REDNESS: "Does the swelling look red or infected?"     no 5. PAIN: "Is the swelling painful to touch?" If so, ask: "How painful is it?"   (Scale 1-10; mild, moderate or severe)     *No Answer* 6. FEVER: "Do you have a fever?" If so, ask: "What is it, how was it measured, and when did it start?"      no 7. CAUSE: "What do you think is causing the leg swelling?"     Lyrica states did not have swelling until starting Lyrica 8. MEDICAL HISTORY: "Do you have a history of heart failure, kidney disease, liver failure, or cancer?"     no 9. RECURRENT SYMPTOM: "Have you had leg swelling before?" If so, ask: "When was the last time?" "What happened that time?"     Blood clot in leg in left leg when pt was 77 years old-put in hospital on blood thinner 10. OTHER SYMPTOMS: "Do you have any other symptoms?" (e.g., chest pain, difficulty breathing)       Full of fluid in chest 11. PREGNANCY: "Is there any chance you are pregnant?" "When was your last menstrual period?"       n/a  Protocols used: LEG SWELLING AND EDEMA-A-AH

## 2018-03-18 NOTE — Telephone Encounter (Signed)
Spoke with patient to see how she was feeling. She states that she did not take this morning dose of Lyrica and that she no longer has any chest pain and that her shortness of breath has improved as well. Instructed her to please continue monitoring and if symptoms should persist or worsen to please go to ED for further evaluation. She verbalized understanding of our question with no further questions at this time.

## 2018-03-18 NOTE — Telephone Encounter (Signed)
Pt c/o swelling: STAT is pt has developed SOB within 24 hours  1) How much weight have you gained and in what time span? Yes. 3 pounds   2) If swelling, where is the swelling located? Bilateral feet  3) Are you currently taking a fluid pill? Yes took one this am.  4) Are you currently SOB? sometimes  Do you have a log of your daily weights (if so, list)? Weight on Tues was 155, this morning was 160.6  5) Have you gained 3 pounds in a day or 5 pounds in a week? yes  6) Have you traveled recently? no  Pt states she has some tightness in her chest

## 2018-03-27 NOTE — Progress Notes (Signed)
Cardiology Office Note  Date:  03/29/2018   ID:  Catherine Green, DOB 01-16-1941, MRN [id number]  PCP:  Tonia Ghent, MD   Chief Complaint  Patient presents with  . other    6 month follow up. Pt. c/o dizziness, leg weakness chest twinges and shortness of breath. Meds reviewed by the pt. verbally.     HPI:  Catherine Green is a very pleasant 77 year-old woman with history  coronary artery disease,  bypass surgery,  peripheral vascular disease   right CEA, left CEA followed by [name], 01/2018: left ICA are consistent with a 40-59% stenosis. catheterization January 2009 showing patent vein grafts, atretic LIMA to the LAD. Hx of anxiety Back surgery in May 2017,  who presents for routine followup of her coronary artery disease.   Was on gabapentin, stopped and started on lyrica Had side effects on lyrica? Leg swelling, dizzy, could not breath, face swelling Back to normal, still dizzy Back on gabapentin Legs weak She has a treadmill but does not use it Difficulty getting up and down from the exam table today without assistance Somewhat anxious Continued sleep disorder  Sedentary, limited by back "did ablation"  Tolerating Crestor and Zetia  Orthostatics done in the office today for dizziness These were negative Initial blood pressures running   high Improved on my recheck  EKG personally reviewed by myself on todays visit shows normal sinus rhythm with rate 64 bpm, no significant ST or T wave changes  Other past medical history reviewed Chronic issue "all day" with nausea/diarrhea,  After eating, anorexia, abdominal pain   Back surgery in May 2017, No regular exercise program  40 to 50% on the left <39% on the right Followed in [place]  past medical history reviewed Previous MRI of her back reviewed with her showing DJD, foraminal narrowing Seen by [name].   Previously went on a cruise and developed pneumonia, required ABX Reports having  labile blood pressure.   Previous symptoms of shortness of breath and weight gain. Improved with Lasix to take as needed.  She had her gallbladder taken out 03/13/2014.  she had "hair loss" on simvastatin, as well as sores on her head. Lipitor caused leg problems/myalgias.   Previously stopped her "nerve" pill. Significant stressors as husband is sick. She was previously using the treadmill on a regular basis   PMH:   has a past medical history of Allergy, Anemia, Anxiety, Carotid artery occlusion, Cerebrovascular disease, Chicken pox, Coronary artery disease, Depression, Dizziness, Dizziness, DVT (deep venous thrombosis) ([place]), Fainting spell, GERD (gastroesophageal reflux disease), Headache, Heart murmur, Hyperlipidemia, Hypertension, PONV (postoperative nausea and vomiting), Pre-syncope, PVD (peripheral vascular disease) ([place]), Seasonal allergies, Shortness of breath dyspnea, Swelling of both ankles, Thrombophlebitis, and Ulcer.  PSH:    Past Surgical History:  Procedure Laterality Date  . APPENDECTOMY    . BACK SURGERY  01/14/16  . BREAST SURGERY     saline implants  . CARDIAC CATHETERIZATION    . CAROTID ENDARTERECTOMY  1992  . CAROTID ENDARTERECTOMY Right February 13, 2005   Re-do Right CE  . CATARACT EXTRACTION, BILATERAL  2013  . CHOLECYSTECTOMY, LAPAROSCOPIC  04/02/14   Dr. Rochel Brome  . CORONARY ARTERY BYPASS GRAFT  2006   x3 Dr. Prescott Gum  . ENDARTERECTOMY Left 07/20/2014   Procedure: ENDARTERECTOMY CAROTID-LEFT;  Surgeon: Rosetta Posner, MD;  Location: [facility];  Service: Vascular;  Laterality: Left;  . EYE SURGERY Bilateral Feb. 2013   Cataract Left  eye  . SEPTOPLASTY     with bilateral inferior turbinate reductions  . SPINE SURGERY  12/2015  . TOTAL ABDOMINAL HYSTERECTOMY    . UPPER GASTROINTESTINAL ENDOSCOPY     had polyps removed from esophagus  . UPPER GI ENDOSCOPY      Current Outpatient Medications  Medication Sig Dispense Refill  . aspirin 81 MG tablet Take 1  tablet (81 mg total) by mouth daily. 30 tablet   . carvedilol (COREG) 3.125 MG tablet TAKE 1 TABLET BY MOUTH TWICE A DAY 180 tablet 0  . Coenzyme Q10 (CO Q10) 100 MG CAPS Take 100 mg by mouth daily.    Marland Kitchen ezetimibe (ZETIA) 10 MG tablet TAKE ONE TABLET BY MOUTH EVERY DAY 90 tablet 3  . guaiFENesin (MUCINEX) 600 MG 12 hr tablet Take 600 mg by mouth 2 (two) times daily as needed.    . hydrochlorothiazide (HYDRODIURIL) 12.5 MG tablet Take 1 tablet (12.5 mg total) by mouth daily. 90 tablet 3  . MAGNESIUM PO Take by mouth.    . meclizine (ANTIVERT) 25 MG tablet Take 25 mg by mouth daily as needed for dizziness.    . ranitidine (ZANTAC) 150 MG tablet Take 150 mg by mouth 2 (two) times daily.    . rosuvastatin (CRESTOR) 20 MG tablet TAKE ONE TABLET BY MOUTH EVERY EVENING 90 tablet 3   No current facility-administered medications for this visit.      Allergies:   Lipitor [atorvastatin]; Oxycodone hcl; Protonix [pantoprazole sodium]; Doxycycline; Hydrochlorothiazide; Hydrocodone; Hydroxyzine; Lyrica [pregabalin]; Metoprolol; Penicillin g; Tramadol; Trazodone and nefazodone; Zoloft [sertraline]; Augmentin [amoxicillin-pot clavulanate]; Codeine; Demerol; Ivp dye [iodinated diagnostic agents]; and Shellfish allergy   Social History:  The patient  reports that she quit smoking about 21 years ago. Her smoking use included cigarettes. She has a 4.50 pack-year smoking history. She has never used smokeless tobacco. She reports that she does not drink alcohol or use drugs.   Family History:   family history includes Aneurysm in her brother and mother; Birth defects in her brother; Bleeding Disorder in her mother and sister; Cancer in her sister; Cirrhosis in her father; Deep vein thrombosis in her brother; Heart attack in her father; Heart disease in her brother, father, mother, and sister; Hyperlipidemia in her brother, daughter, father, mother, and sister; Hypertension in her brother, daughter, father, mother,  other, and sister; Peripheral vascular disease in her daughter; Varicose Veins in her mother and sister.    Review of Systems: Review of Systems  Constitutional: Negative.   Respiratory: Negative.   Cardiovascular: Negative.   Gastrointestinal: Negative.   Musculoskeletal: Positive for back pain.  Neurological: Negative.   Psychiatric/Behavioral: The patient is nervous/anxious and has insomnia.   All other systems reviewed and are negative.    PHYSICAL EXAM: VS:  BP 140/62 (BP Location: Left Arm, Patient Position: Sitting, Cuff Size: Normal)   Pulse 64   Ht 5\' 2"  (1.575 m)   Wt 159 lb 12 oz (72.5 kg)   SpO2 98%   BMI 29.22 kg/m  , BMI Body mass index is 29.22 kg/m. Constitutional:  oriented to person, place, and time. No distress. weak, difficulty ambulating HENT:  Head: Normocephalic and atraumatic.  Eyes:  no discharge. No scleral icterus.  Neck: Normal range of motion. Neck supple. No JVD present.  2+ carotid bruit on the right Cardiovascular: Normal rate, regular rhythm, normal heart sounds and intact distal pulses. Exam reveals no gallop and no friction rub. No edema 2/6 systolic ejection murmur  heard left sternal border Pulmonary/Chest: Effort normal and breath sounds normal. No stridor. No respiratory distress.  no wheezes.  no rales.  no tenderness.  Abdominal: Soft.  no distension.  no tenderness.  Musculoskeletal: Normal range of motion.  no  tenderness or deformity.  Neurological:  normal muscle tone. Coordination normal. No atrophy Skin: Skin is warm and dry. No rash noted. not diaphoretic.  Psychiatric:  normal mood and affect. behavior is normal. Thought content normal.    Recent Labs: 05/24/2017: TSH 0.50 08/04/2017: Brain Natriuretic Peptide 172; Hemoglobin 11.1; Platelets 302 01/11/2018: ALT 20; BUN 14; Creatinine, Ser 0.78; Potassium 4.6; Sodium 140    Lipid Panel Lab Results  Component Value Date   CHOL 161 01/11/2018   HDL 62.30 01/11/2018    LDLCALC 71 01/11/2018   TRIG 137.0 01/11/2018      Wt Readings from Last 3 Encounters:  03/29/18 159 lb 12 oz (72.5 kg)  03/08/18 158 lb 12 oz (72 kg)  02/22/18 160 lb (72.6 kg)       ASSESSMENT AND PLAN:  Adjustment disorder Previous discussion with her Loss of several family members, insomnia,  Carotid stenosis, bilateral -  Stable disease bilaterally , continue aggressive cholesterol management Managed in [place] 40-59% disease on the right on recent ultrasound, results discussed with her  Essential hypertension -  Initial blood pressure was elevated but this improved on my recheck No significant orthostasis to explain dizziness  Hyperlipidemia, unspecified hyperlipidemia type -  On Crestor and Zetia Cholesterol at goal. Numbers discussed with her, stable  Atherosclerosis of coronary artery bypass graft of native heart without angina pectoris Currently with no anginal symptoms. No further testing at this time.  Stable. Recommended she get more active  Abdominal pain Previous mesenteric ultrasound, flow stable  Leg swelling Most likely venous insufficiency Table symptoms  Anxiety/neuropathic pain Reports she did not feel well on Lyrica stop the medication now back on gabapentin Continues to have dizziness Some component of disability with chronic leg weakness Needs to start a regular exercise program Recommended water walking in her sister's pool    Total encounter time more than 25 minutes  Greater than 50% was spent in counseling and coordination of care with the patient   Disposition:   F/U  12 months   Orders Placed This Encounter  Procedures  . EKG 12-Lead     Signed, Esmond Plants, M.D., Ph.D. 03/29/2018  [place], [place]

## 2018-03-29 ENCOUNTER — Encounter: Payer: Self-pay | Admitting: Cardiovascular Disease

## 2018-03-29 ENCOUNTER — Ambulatory Visit: Payer: Medicare HMO | Admitting: Cardiovascular Disease

## 2018-03-29 VITALS — BP 140/62 | HR 64 | Ht 62.0 in | Wt 159.8 lb

## 2018-03-29 DIAGNOSIS — I5033 Acute on chronic diastolic (congestive) heart failure: Secondary | ICD-10-CM

## 2018-03-29 DIAGNOSIS — I25118 Atherosclerotic heart disease of native coronary artery with other forms of angina pectoris: Secondary | ICD-10-CM | POA: Diagnosis not present

## 2018-03-29 DIAGNOSIS — E782 Mixed hyperlipidemia: Secondary | ICD-10-CM | POA: Diagnosis not present

## 2018-03-29 DIAGNOSIS — R0989 Other specified symptoms and signs involving the circulatory and respiratory systems: Secondary | ICD-10-CM

## 2018-03-29 DIAGNOSIS — I10 Essential (primary) hypertension: Secondary | ICD-10-CM

## 2018-03-29 DIAGNOSIS — I6523 Occlusion and stenosis of bilateral carotid arteries: Secondary | ICD-10-CM

## 2018-03-29 NOTE — Patient Instructions (Signed)

## 2018-04-13 DIAGNOSIS — Z6829 Body mass index (BMI) 29.0-29.9, adult: Secondary | ICD-10-CM | POA: Diagnosis not present

## 2018-04-13 DIAGNOSIS — I10 Essential (primary) hypertension: Secondary | ICD-10-CM | POA: Diagnosis not present

## 2018-04-18 ENCOUNTER — Ambulatory Visit (HOSPITAL_COMMUNITY)
Admission: RE | Admit: 2018-04-18 | Discharge: 2018-04-18 | Disposition: A | Discharge status: Home / Self Care | Payer: Medicare HMO | Source: Ambulatory Visit | Attending: Surgery | Admitting: Surgery

## 2018-04-18 ENCOUNTER — Other Ambulatory Visit (HOSPITAL_COMMUNITY): Payer: Self-pay | Admitting: Neurosurgery

## 2018-04-18 DIAGNOSIS — M545 Low back pain: Secondary | ICD-10-CM | POA: Diagnosis not present

## 2018-04-18 DIAGNOSIS — I739 Peripheral vascular disease, unspecified: Secondary | ICD-10-CM | POA: Insufficient documentation

## 2018-04-22 ENCOUNTER — Ambulatory Visit (INDEPENDENT_AMBULATORY_CARE_PROVIDER_SITE_OTHER): Payer: Medicare HMO | Admitting: Family Medicine

## 2018-04-22 ENCOUNTER — Encounter: Payer: Self-pay | Admitting: Family Medicine

## 2018-04-22 VITALS — BP 142/80 | HR 60 | Temp 98.7°F | Ht 62.0 in | Wt 160.8 lb

## 2018-04-22 DIAGNOSIS — G629 Polyneuropathy, unspecified: Secondary | ICD-10-CM

## 2018-04-22 DIAGNOSIS — R739 Hyperglycemia, unspecified: Secondary | ICD-10-CM | POA: Diagnosis not present

## 2018-04-22 LAB — CBC WITH DIFFERENTIAL/PLATELET
Basophils Absolute: 0 10*3/uL (ref 0.0–0.1)
Basophils Relative: 0.4 % (ref 0.0–3.0)
Eosinophils Absolute: 0.1 10*3/uL (ref 0.0–0.7)
Eosinophils Relative: 1.7 % (ref 0.0–5.0)
HCT: 37.8 % (ref 36.0–46.0)
Hemoglobin: 12.3 g/dL (ref 12.0–15.0)
Lymphocytes Relative: 29.2 % (ref 12.0–46.0)
Lymphs Abs: 1.7 10*3/uL (ref 0.7–4.0)
MCHC: 32.6 g/dL (ref 30.0–36.0)
MCV: 80.9 fl (ref 78.0–100.0)
Monocytes Absolute: 0.4 10*3/uL (ref 0.1–1.0)
Monocytes Relative: 7.8 % (ref 3.0–12.0)
Neutro Abs: 3.5 10*3/uL (ref 1.4–7.7)
Neutrophils Relative %: 60.9 % (ref 43.0–77.0)
Platelets: 320 10*3/uL (ref 150.0–400.0)
RBC: 4.67 Mil/uL (ref 3.87–5.11)
RDW: 14.6 % (ref 11.5–15.5)
WBC: 5.8 10*3/uL (ref 4.0–10.5)

## 2018-04-22 LAB — COMPREHENSIVE METABOLIC PANEL
ALT: 13 U/L (ref 0–35)
AST: 16 U/L (ref 0–37)
Albumin: 4.2 g/dL (ref 3.5–5.2)
Alkaline Phosphatase: 72 U/L (ref 39–117)
BUN: 12 mg/dL (ref 6–23)
CO2: 39 mEq/L — ABNORMAL HIGH (ref 19–32)
Calcium: 9.7 mg/dL (ref 8.4–10.5)
Chloride: 94 mEq/L — ABNORMAL LOW (ref 96–112)
Creatinine, Ser: 0.85 mg/dL (ref 0.40–1.20)
GFR: 68.73 mL/min (ref >60.00)
Glucose, Bld: 118 mg/dL — ABNORMAL HIGH (ref 70–99)
Potassium: 3.2 mEq/L — ABNORMAL LOW (ref 3.5–5.1)
Sodium: 138 mEq/L (ref 135–145)
Total Bilirubin: 0.7 mg/dL (ref 0.2–1.2)
Total Protein: 6.7 g/dL (ref 6.0–8.3)

## 2018-04-22 LAB — HEMOGLOBIN A1C: Hgb A1c MFr Bld: 7 % — ABNORMAL HIGH (ref 4.6–6.5)

## 2018-04-22 LAB — TSH: TSH: 1.06 u[IU]/mL (ref 0.35–4.50)

## 2018-04-22 LAB — VITAMIN B12: Vitamin B-12: 215 pg/mL (ref 211–911)

## 2018-04-22 NOTE — Patient Instructions (Signed)
We will call about your referral.  Rosaria Ferries or Azalee Course will call you if you don't see one of them on the way out.  Go to the lab on the way out.  We'll contact you with your lab report. Take care.  Glad to see you.  Update me as needed.

## 2018-04-22 NOTE — Progress Notes (Signed)
Final Interpretation: Right: Resting right ankle-brachial index is within normal range. No evidence of significant right lower extremity arterial disease. The right toe-brachial index is normal. RT great toe pressure = 168 mmHg.  Left: Resting left ankle-brachial index is within normal range. No evidence of significant left lower extremity arterial disease. The left toe-brachial index is abnormal. LT Great toe pressure = 143 mmHg. ===================================================== She still has B leg pain with burning and shocking pain.  Not in the arms.  Episodic sx.  She'll feel focally weak from the pain.  Brought to tears by pain, when it happens.  Thought not to have mechanical source by Dr. Annette Stable.    She is back on gabapentin and it helps a little, except when the pain hits.  Laying down helps some but she can't sleep long w/o pain starting.    PMH and SH reviewed  ROS: Per HPI unless specifically indicated in ROS section   Meds, vitals, and allergies reviewed.   nad ncat Mmm Neck supple, no LA rrr ctab S/S wnl BLE  Able to bear weight.  S/S grossly intact in the bilateral lower extremities.

## 2018-04-24 ENCOUNTER — Encounter: Payer: Self-pay | Admitting: Family Medicine

## 2018-04-24 ENCOUNTER — Other Ambulatory Visit: Payer: Self-pay | Admitting: Family Medicine

## 2018-04-24 DIAGNOSIS — R809 Proteinuria, unspecified: Secondary | ICD-10-CM

## 2018-04-24 DIAGNOSIS — E1129 Type 2 diabetes mellitus with other diabetic kidney complication: Secondary | ICD-10-CM | POA: Insufficient documentation

## 2018-04-24 DIAGNOSIS — G629 Polyneuropathy, unspecified: Secondary | ICD-10-CM

## 2018-04-24 DIAGNOSIS — R739 Hyperglycemia, unspecified: Secondary | ICD-10-CM

## 2018-04-24 MED ORDER — VITAMIN B-12 1000 MCG PO TABS: 1000.0000 ug | ORAL_TABLET | Freq: Every day | ORAL | Status: DC

## 2018-04-24 NOTE — Assessment & Plan Note (Signed)
Discussed with patient about options.  She has seen [name] and she was thought not to have mechanical symptoms.  She did not have significant vascular disease seen on recent imaging that would explain her bilateral symptoms.  Discussed with patient about options.  She has neuropathic type pain.  Reasonable to work her up for neuropathy.  Check routine labs today and refer to neurology.  She may end up needing nerve conduction studies.  I greatly appreciate the help of all involved.  All questions answered to the best my ability. >25 minutes spent in face to face time with patient, >50% spent in counselling or coordination of care

## 2018-04-25 ENCOUNTER — Other Ambulatory Visit: Payer: Self-pay | Admitting: Cardiovascular Disease

## 2018-04-29 ENCOUNTER — Encounter: Payer: Self-pay | Admitting: Diagnostic Neuroimaging

## 2018-04-29 ENCOUNTER — Ambulatory Visit: Payer: Medicare HMO | Admitting: Diagnostic Neuroimaging

## 2018-04-29 VITALS — BP 150/63 | HR 61 | Ht 62.0 in | Wt 158.0 lb

## 2018-04-29 DIAGNOSIS — G629 Polyneuropathy, unspecified: Secondary | ICD-10-CM

## 2018-04-29 MED ORDER — GABAPENTIN 300 MG PO CAPS: 300.0000 mg | ORAL_CAPSULE | Freq: Three times a day (TID) | ORAL | 11 refills | Status: DC

## 2018-04-29 MED ORDER — DULOXETINE HCL 30 MG PO CPEP: 30.0000 mg | ORAL_CAPSULE | Freq: Every day | ORAL | 12 refills | Status: DC

## 2018-04-29 NOTE — Patient Instructions (Signed)
NEUROPATHY - check EMG/NCS (nerve testing)  - check labs  - start duloxetine 30mg  daily  - restart gabapentin 300mg  three times a day; then increase up to 600mg  three times a day

## 2018-04-29 NOTE — Progress Notes (Signed)
[facility]  PATIENT: Catherine Green DOB: [birth year]  REFERRING CLINICIAN: Orlinda Blalock HISTORY FROM: patient and husband  REASON FOR VISIT: new consult    HISTORICAL  CHIEF COMPLAINT:  Chief Complaint  Patient presents with  . New Patient (Initial Visit)    Room 6 with Husband, Catherine Green   . Pain    Since 2016 she reports chronic back and leg pain. Patient reports she had back surgery in 2017.     HISTORY OF PRESENT ILLNESS:   77 year old female here for evaluation of back pain and lower extremity pain.  2016 patient was having pain in her knees down to her legs.  She was having difficulty walking.  Patient was diagnosed with lumbar spinal stenosis.  In May 2017 she underwent surgery by Dr. Annette Stable.  Symptoms continued.  Patient underwent several low back injections and then radiofrequency ablation without relief.  Patient continues to have pain in her legs with burning, stabbing sensations.  Patient referred here for evaluation of possible neuropathy.   REVIEW OF SYSTEMS: Full 14 system review of systems performed and negative with exception of: Headache dizziness anxiety passing out snoring constipation fatigue swelling in legs.  ALLERGIES: Allergies  Allergen Reactions  . Lipitor [Atorvastatin] Nausea And Vomiting  . Oxycodone Hcl Other (See Comments)    Hallucinations   . Protonix [Pantoprazole Sodium] Nausea Only  . Doxycycline Other (See Comments)    weakness  . Hydrochlorothiazide Other (See Comments)    Not an allergy but urinary frequency.    . Hydrocodone Other (See Comments)    hallucinate  . Hydroxyzine Other (See Comments)    Excessive sweating.   Recardo Evangelist [Pregabalin] Other (See Comments)    Edema, SOB, chest tight  . Metoprolol Other (See Comments)    Slowed body down, per pt   . Penicillin G Nausea Only and Other (See Comments)    N&V  . Tramadol Other (See Comments)    Sedation   . Trazodone And Nefazodone Other (See Comments)   Sedation   . Zoloft [Sertraline] Other (See Comments)    Excessive sweating  . Augmentin [Amoxicillin-Pot Clavulanate] Nausea And Vomiting  . Codeine Nausea And Vomiting and Other (See Comments)  . Demerol Nausea And Vomiting  . Ivp Dye [Iodinated Diagnostic Agents] Hives and Itching       . Shellfish Allergy Hives and Itching    HOME MEDICATIONS: Outpatient Medications Prior to Visit  Medication Sig Dispense Refill  . aspirin 81 MG tablet Take 1 tablet (81 mg total) by mouth daily. 30 tablet   . carvedilol (COREG) 3.125 MG tablet TAKE 1 TABLET BY MOUTH TWICE A DAY 180 tablet 0  . Coenzyme Q10 (CO Q10) 100 MG CAPS Take 100 mg by mouth daily.    Marland Kitchen ezetimibe (ZETIA) 10 MG tablet TAKE ONE TABLET BY MOUTH EVERY DAY 90 tablet 3  . guaiFENesin (MUCINEX) 600 MG 12 hr tablet Take 600 mg by mouth 2 (two) times daily as needed.    . hydrochlorothiazide (HYDRODIURIL) 12.5 MG tablet Take 1 tablet (12.5 mg total) by mouth daily. 90 tablet 3  . MAGNESIUM PO Take by mouth.    . meclizine (ANTIVERT) 25 MG tablet Take 25 mg by mouth daily as needed for dizziness.    . ranitidine (ZANTAC) 150 MG tablet Take 150 mg by mouth 2 (two) times daily.    . rosuvastatin (CRESTOR) 20 MG tablet TAKE 1 TABLET BY MOUTH EVERY EVENING 90 tablet 3  .  vitamin B-12 (CYANOCOBALAMIN) 1000 MCG tablet Take 1 tablet (1,000 mcg total) by mouth daily.    Marland Kitchen gabapentin (NEURONTIN) 300 MG capsule Take 1 or 2 capsules by mouth 3 times daily as needed.     No facility-administered medications prior to visit.     PAST MEDICAL HISTORY: Past Medical History:  Diagnosis Date  . Allergy    hay fever  . Anemia   . Anxiety   . Back pain   . Carotid artery occlusion   . Cerebrovascular disease    extracranial; occlusive  . Chicken pox   . Coronary artery disease   . Depression   . Dizziness   . Dizziness   . DVT (deep venous thrombosis) ([place])   . Fainting spell   . GERD (gastroesophageal reflux disease)   . Headache   .  Heart murmur   . Hyperlipidemia   . Hypertension   . Leg pain   . PONV (postoperative nausea and vomiting)    severe nausea and vomiting  . Pre-syncope   . PVD (peripheral vascular disease) ([place])    endarterectomy by [name]  . Seasonal allergies   . Shortness of breath dyspnea    wth ambulation at times  . Swelling of both ankles    and abdomen; takes Lasix when needed  . Thrombophlebitis    following childbirth  . Ulcer     PAST SURGICAL HISTORY: Past Surgical History:  Procedure Laterality Date  . APPENDECTOMY    . BACK SURGERY  01/14/16  . BREAST SURGERY     saline implants  . CARDIAC CATHETERIZATION    . CAROTID ENDARTERECTOMY  1992  . CAROTID ENDARTERECTOMY Right February 13, 2005   Re-do Right CE  . CATARACT EXTRACTION, BILATERAL  2013  . CHOLECYSTECTOMY, LAPAROSCOPIC  04/02/14   Dr. Rochel Brome  . CORONARY ARTERY BYPASS GRAFT  2006   x3 Dr. Prescott Gum  . ENDARTERECTOMY Left 07/20/2014   Procedure: ENDARTERECTOMY CAROTID-LEFT;  Surgeon: Rosetta Posner, MD;  Location: [facility];  Service: Vascular;  Laterality: Left;  . EYE SURGERY Bilateral Feb. 2013   Cataract Left eye  . SEPTOPLASTY     with bilateral inferior turbinate reductions  . SPINE SURGERY  12/2015  . TOTAL ABDOMINAL HYSTERECTOMY    . UPPER GASTROINTESTINAL ENDOSCOPY     had polyps removed from esophagus  . UPPER GI ENDOSCOPY      FAMILY HISTORY: Family History  Problem Relation Age of Onset  . Aneurysm Mother        brain  . Heart disease Mother        Aneyursm   . Hyperlipidemia Mother   . Hypertension Mother   . Varicose Veins Mother   . Bleeding Disorder Mother   . Heart disease Father   . Cirrhosis Father   . Heart attack Father   . Hyperlipidemia Father   . Hypertension Father   . Heart disease Brother        Before age 67  . Aneurysm Brother   . Deep vein thrombosis Brother   . Birth defects Brother   . Hyperlipidemia Brother   . Hypertension Brother   . Hypertension Other   .  Cancer Sister        lung  . Heart disease Sister        Aneurysm  . Hyperlipidemia Sister   . Hypertension Sister   . Varicose Veins Sister   . Peripheral vascular disease Daughter   .  Hyperlipidemia Daughter   . Hypertension Daughter   . Bleeding Disorder Sister   . Breast cancer Neg Hx   . Colon cancer Neg Hx     SOCIAL HISTORY: Social History   Socioeconomic History  . Marital status: Married    Spouse name: Catherine Green   . Number of children: 1  . Years of education: Not on file  . Highest education level: 11th grade  Occupational History    Comment: Retired   Scientific laboratory technician  . Financial resource strain: Not on file  . Food insecurity:    Worry: Not on file    Inability: Not on file  . Transportation needs:    Medical: Not on file    Non-medical: Not on file  Tobacco Use  . Smoking status: Former Smoker    Packs/day: 0.30    Years: 15.00    Pack years: 4.50    Types: Cigarettes    Last attempt to quit: 08/31/1996    Years since quitting: 21.6  . Smokeless tobacco: Never Used  Substance and Sexual Activity  . Alcohol use: No    Alcohol/week: 0.0 standard drinks  . Drug use: No  . Sexual activity: Not Currently  Lifestyle  . Physical activity:    Days per week: Not on file    Minutes per session: Not on file  . Stress: Not on file  Relationships  . Social connections:    Talks on phone: Not on file    Gets together: Not on file    Attends religious service: Not on file    Active member of club or organization: Not on file    Attends meetings of clubs or organizations: Not on file    Relationship status: Not on file  . Intimate partner violence:    Fear of current or ex partner: Not on file    Emotionally abused: Not on file    Physically abused: Not on file    Forced sexual activity: Not on file  Other Topics Concern  . Not on file  Social History Narrative   Lives with husband, has 1 daughter. No pets.   Work - retired Emergency planning/management officer   Diet - healthy    Right handed    Caffeine- 1 cup per day.     PHYSICAL EXAM  GENERAL EXAM/CONSTITUTIONAL: Vitals:  Vitals:   04/29/18 0811  BP: (!) 150/63  Pulse: 61  Weight: 158 lb (71.7 kg)  Height: 5' 2"  (1.575 m)     Body mass index is 28.9 kg/m. Wt Readings from Last 3 Encounters:  04/29/18 158 lb (71.7 kg)  04/22/18 160 lb 12 oz (72.9 kg)  03/29/18 159 lb 12 oz (72.5 kg)     Patient is in no distress; well developed, nourished and groomed; neck is supple  CARDIOVASCULAR:  Examination of carotid arteries --> BLOWING RIGHT CAROTID BRUIT  Regular rate and rhythm; SYSTOLIC MURMUR  Examination of peripheral vascular system by observation and palpation is normal  EYES:  Ophthalmoscopic exam of optic discs and posterior segments is normal; no papilledema or hemorrhages  Visual Acuity Screening   Right eye Left eye Both eyes  Without correction: 20/100 20/70   With correction:        MUSCULOSKELETAL:  Gait, strength, tone, movements noted in Neurologic exam below  NEUROLOGIC: MENTAL STATUS:  MMSE - Mini Mental State Exam 01/11/2018 01/07/2017  Orientation to time 5 5  Orientation to Place 5 5  Registration 3 3  Attention/ Calculation  0 0  Recall 3 3  Language- name 2 objects 0 0  Language- repeat 1 1  Language- follow 3 step command 3 3  Language- read & follow direction 0 0  Write a sentence 0 0  Copy design 0 0  Total score 20 20    awake, alert, oriented to person, place and time  recent and remote memory intact  normal attention and concentration  language fluent, comprehension intact, naming intact  fund of knowledge appropriate  CRANIAL NERVE:   2nd - no papilledema on fundoscopic exam  2nd, 3rd, 4th, 6th - pupils equal and reactive to light, visual fields full to confrontation, extraocular muscles intact, no nystagmus  5th - facial sensation symmetric  7th - facial strength symmetric  8th - hearing intact  9th - palate elevates  symmetrically, uvula midline  11th - shoulder shrug symmetric  12th - tongue protrusion midline  MOTOR:   normal bulk and tone, BILATERAL DELTOID, BICEP, TRICEP LIMTIED BY PAIN (4); GRIP 5; BLE HIP FLEX, KNEE EXT/FLEX 4; DF 5  POSTURAL AND ACTION TREMOR IN BUE  MILD VOICE TREMOR  SENSORY:   DECR PP IN HANDS AND FEET/LEGS; DECR TEMP AND VIB AT TOES  COORDINATION:   finger-nose-finger, fine finger movements SLOW  REFLEXES:   deep tendon reflexes --> BUE TRACE; ABSENT IN BLE  GAIT/STATION:   ANTALGIC, UNSTEADY GAIT     DIAGNOSTIC DATA (LABS, IMAGING, TESTING) - I reviewed patient records, labs, notes, testing and imaging myself where available.  Lab Results  Component Value Date   WBC 5.8 04/22/2018   HGB 12.3 04/22/2018   HCT 37.8 04/22/2018   MCV 80.9 04/22/2018   PLT 320.0 04/22/2018      Component Value Date/Time   NA 138 04/22/2018 0923   NA 138 05/08/2013 0936   K 3.2 (L) 04/22/2018 0923   CL 94 (L) 04/22/2018 0923   CO2 39 (H) 04/22/2018 0923   GLUCOSE 118 (H) 04/22/2018 0923   BUN 12 04/22/2018 0923   BUN 14 05/08/2013 0936   CREATININE 0.85 04/22/2018 0923   CREATININE 0.81 08/04/2017 1649   CALCIUM 9.7 04/22/2018 0923   PROT 6.7 04/22/2018 0923   PROT 7.0 06/29/2016 0945   ALBUMIN 4.2 04/22/2018 0923   ALBUMIN 4.6 06/29/2016 0945   AST 16 04/22/2018 0923   ALT 13 04/22/2018 0923   ALKPHOS 72 04/22/2018 0923   BILITOT 0.7 04/22/2018 0923   BILITOT 0.5 06/29/2016 0945   GFRNONAA >60 04/11/2015 1037   GFRAA >60 04/11/2015 1037   Lab Results  Component Value Date   CHOL 161 01/11/2018   HDL 62.30 01/11/2018   LDLCALC 71 01/11/2018   LDLDIRECT 139.1 04/11/2012   TRIG 137.0 01/11/2018   CHOLHDL 3 01/11/2018   Lab Results  Component Value Date   HGBA1C 7.0 (H) 04/22/2018   Lab Results  Component Value Date   VITAMINB12 215 04/22/2018   Lab Results  Component Value Date   TSH 1.06 04/22/2018    12/25/15 CT lumbar [I reviewed  images myself and agree with interpretation. -VRP]  1. Mild disc bulge L2-3 with foraminal encroachment left greater than right. 2. Mild disc bulge L3-4 with mild bilateral foraminal encroachment. 3. Right posterolateral protrusion L4-5 with subarticular recess narrowing, foraminal encroachment right greater than left.  05/24/16 MRI lumbar spine [I reviewed images myself and agree with interpretation. -VRP]  1. Postoperative changes at L4-L5 with bilateral laminectomy. Bilateral epidural granulation tissue with improved lateral recess patency.  Small broad-based disc protrusion with annular fissure here appears stable to the prior outside MRI. Mild to moderate bilateral L4 foraminal stenosis does appear mildly progressed since the preoperative MRI. 2. Other lumbar levels are stable, including borderline to mild spinal stenosis at L2-L3 and L3-L4. 3. Transitional lumbosacral anatomy, same numbering system as on the CT myelogram 12/25/2015 designating a partially sacralized L5 level and small ribs at T12. Note that there is chronic osseous spurring at the right L5 neural foramen with stable associated foraminal Stenosis.  02/12/17 MRI lumbar spine [I reviewed images myself and agree with interpretation. -VRP]  - Stable postoperative and degenerative changes in the lumbar spine with up to mild neural foraminal and lateral recess stenosis as above.    ASSESSMENT AND PLAN  77 y.o. year old female here with chronic neuropathic pain in lower extreme is, likely related to prior lumbar spinal stenosis, status post surgery and interventional pain management.  Also with upper and lower extremity proximal muscle weakness, decreased sensation in lower extremities, and persistent pain.  We will proceed with further work-up.  Dx:  1. Neuropathy       PLAN:  NEUROPATHIC PAIN + muscle weakness (with signs of neuropathy affecting upper and lower ext; ? Diabetic, low B12, or other cause; rule out  CIDP) - check EMG/NCS - check labs - start duloxetine 5m daily - restart gabapentin 3059mthree times a day; titration up to 60044mhree times a day and then 900m22mree times a day   Orders Placed This Encounter  Procedures  . CK  . Aldolase  . Multiple Myeloma Panel (SPEP&IFE w/QIG)  . Angiotensin converting enzyme  . ANA,IFA RA Diag Pnl w/rflx Tit/Patn  . Pan-ANCA  . NCV with EMG(electromyography)   Meds ordered this encounter  Medications  . DULoxetine (CYMBALTA) 30 MG capsule    Sig: Take 1 capsule (30 mg total) by mouth daily.    Dispense:  30 capsule    Refill:  12  . gabapentin (NEURONTIN) 300 MG capsule    Sig: Take 1-2 capsules (300-600 mg total) by mouth 3 (three) times daily.    Dispense:  90 capsule    Refill:  11   Return for for NCV/EMG.    VIKRPenni Bombard 8/302/77/[id number] in Neurology, Neurophysiology and Neuroimaging  [facility] [address]itWallowaeOr[place] Park [phone]

## 2018-05-05 LAB — PAN-ANCA
ANCA Proteinase 3: <3.5 U/mL (ref 0.0–3.5)
Atypical pANCA: <1:20 {titer}
C-ANCA: <1:20 {titer}
Myeloperoxidase Ab: <9 U/mL (ref 0.0–9.0)
P-ANCA: <1:20 {titer}

## 2018-05-05 LAB — MULTIPLE MYELOMA PANEL, SERUM
Albumin SerPl Elph-Mcnc: 3.7 g/dL (ref 2.9–4.4)
Albumin/Glob SerPl: 1.3 (ref 0.7–1.7)
Alpha 1: 0.3 g/dL (ref 0.0–0.4)
Alpha2 Glob SerPl Elph-Mcnc: 1 g/dL (ref 0.4–1.0)
B-Globulin SerPl Elph-Mcnc: 1.1 g/dL (ref 0.7–1.3)
Gamma Glob SerPl Elph-Mcnc: 0.5 g/dL (ref 0.4–1.8)
Globulin, Total: 2.9 g/dL (ref 2.2–3.9)
IgA/Immunoglobulin A, Serum: 169 mg/dL (ref 64–422)
IgG (Immunoglobin G), Serum: 473 mg/dL — ABNORMAL LOW (ref 700–1600)
IgM (Immunoglobulin M), Srm: 53 mg/dL (ref 26–217)
Total Protein: 6.6 g/dL (ref 6.0–8.5)

## 2018-05-05 LAB — ANA,IFA RA DIAG PNL W/RFLX TIT/PATN
ANA Titer 1: NEGATIVE
Cyclic Citrullin Peptide Ab: 3 units (ref 0–19)
Rhuematoid fact SerPl-aCnc: <10 IU/mL (ref 0.0–13.9)

## 2018-05-05 LAB — ANGIOTENSIN CONVERTING ENZYME: Angio Convert Enzyme: 42 U/L (ref 14–82)

## 2018-05-05 LAB — CK: Total CK: 409 U/L — ABNORMAL HIGH (ref 24–173)

## 2018-05-05 LAB — ALDOLASE: Aldolase: 6.2 U/L (ref 3.3–10.3)

## 2018-05-10 ENCOUNTER — Telehealth: Payer: Self-pay | Admitting: *Deleted

## 2018-05-10 NOTE — Telephone Encounter (Signed)
Spoke to pt and relayed that lab results were unremarkable except for elevated muscle enzymes.  She is scheduled for [facility]G on 05-26-18 at 1414m arrive 1330.  She verbalized understanding.

## 2018-05-10 NOTE — Telephone Encounter (Signed)
-----   Message from Penni Bombard, MD sent at 05/05/2018  6:57 PM EDT ----- Unremarkable labs except elevated muscle enzymes. Follow up EMG study. Please call patient. -VRP

## 2018-05-18 ENCOUNTER — Other Ambulatory Visit: Payer: Self-pay

## 2018-05-18 ENCOUNTER — Telehealth: Payer: Self-pay | Admitting: Cardiovascular Disease

## 2018-05-18 MED ORDER — CARVEDILOL 3.125 MG PO TABS: 3.1250 mg | ORAL_TABLET | Freq: Two times a day (BID) | ORAL | 0 refills | Status: DC

## 2018-05-18 NOTE — Telephone Encounter (Signed)
°*  STAT* If patient is at the pharmacy, call can be transferred to refill team.   1. Which medications need to be refilled? (please list name of each medication and dose if known)  Carvedilol (COREG) 3.125 MG - 1 tablet twice daily  2. Which pharmacy/location (including street and city if local pharmacy) is medication to be sent to? [facility] [address]  3. Do they need a 30 day or 90 day supply? 90 day

## 2018-05-18 NOTE — Telephone Encounter (Signed)
carvedilol (COREG) 3.125 MG tablet 180 tablet 0 05/18/2018    Sig - Route: Take 1 tablet (3.125 mg total) by mouth 2 (two) times daily. - Oral   Sent to pharmacy as: carvedilol (COREG) 3.125 MG tablet   E-Prescribing Status: Receipt confirmed by pharmacy (05/18/2018 9:42 AM EDT)   Pharmacy   [id number] [address], [place]

## 2018-05-21 ENCOUNTER — Other Ambulatory Visit: Payer: Self-pay | Admitting: Diagnostic Neuroimaging

## 2018-05-26 ENCOUNTER — Encounter: Payer: Medicare HMO | Admitting: Diagnostic Neuroimaging

## 2018-05-26 ENCOUNTER — Ambulatory Visit (INDEPENDENT_AMBULATORY_CARE_PROVIDER_SITE_OTHER): Payer: Medicare HMO | Admitting: Diagnostic Neuroimaging

## 2018-05-26 DIAGNOSIS — G629 Polyneuropathy, unspecified: Secondary | ICD-10-CM | POA: Diagnosis not present

## 2018-05-26 DIAGNOSIS — Z0289 Encounter for other administrative examinations: Secondary | ICD-10-CM

## 2018-05-26 MED ORDER — DULOXETINE HCL 60 MG PO CPEP: 60.0000 mg | ORAL_CAPSULE | Freq: Every day | ORAL | 12 refills | Status: DC

## 2018-05-26 NOTE — Progress Notes (Signed)
EMG/NCS is normal. Duloxetine is helping. Will increase to 60mg  daily. Repeat CK in 1 month (unclear etiology). Follow up with Dr. Maryjean Ka for pain mgmt.    Penni Bombard, MD 5/62/1308, 6:57 PM Certified in Neurology, Neurophysiology and Neuroimaging  [facility] [address] [place],  [address] [phone]

## 2018-05-27 DIAGNOSIS — R69 Illness, unspecified: Secondary | ICD-10-CM | POA: Diagnosis not present

## 2018-05-30 NOTE — Procedures (Signed)
[facility]  NCS (NERVE CONDUCTION STUDY) WITH EMG (ELECTROMYOGRAPHY) REPORT   STUDY DATE: 9/26//19 PATIENT NAME: Catherine Green DOB: [birth year] MRN: [id number]  ORDERING CLINICIAN: Andrey Spearman, MD   TECHNOLOGIST: Oneita Jolly ELECTROMYOGRAPHER: Earlean Polka. Penumalli, MD  CLINICAL INFORMATION: 77 year old female with pain and elevated CK.   FINDINGS: NERVE CONDUCTION STUDY: Bilateral tibial and peroneal motor responses are normal.  Bilateral sural and superficial peroneal sensory responses are normal.  Bilateral tibial F wave latencies are normal.   NEEDLE ELECTROMYOGRAPHY:  Needle examination of left upper extremity and left lower extremity is unremarkable.   IMPRESSION:   This is a normal study. No electrodiagnostic evidence of large fiber neuropathy or myopathy at this time.    INTERPRETING PHYSICIAN:  Penni Bombard, MD Certified in Neurology, Neurophysiology and Neuroimaging  [facility] [address] [place], [place] [address] [phone]  [facility]    Nerve / Sites Muscle Latency Ref. Amplitude Ref. Rel Amp Segments Distance Velocity Ref. Area    ms ms mV mV %  cm m/s m/s mVms  L Peroneal - EDB     Ankle EDB 5.2 ?6.5 4.1 ?2.0 100 Ankle - EDB 9   15.4     Fib head EDB 10.9  3.5  84.2 Fib head - Ankle 27 47 ?44 14.9     Pop fossa EDB 13.0  3.2  93.9 Pop fossa - Fib head 10 49 ?44 13.8         Pop fossa - Ankle      R Peroneal - EDB     Ankle EDB 4.9 ?6.5 3.6 ?2.0 100 Ankle - EDB 9   16.0     Fib head EDB 11.0  3.2  88.1 Fib head - Ankle 27 44 ?44 15.4     Pop fossa EDB 13.2  2.9  90.1 Pop fossa - Fib head 10 47 ?44 13.6         Pop fossa - Ankle      L Tibial - AH     Ankle AH 4.8 ?5.8 15.8 ?4.0 100 Ankle - AH 9   42.7     Pop fossa AH 12.8  11.8  74.9 Pop fossa - Ankle 37 46 ?41 37.5  R Tibial - AH     Ankle AH 4.9 ?5.8 14.4 ?4.0 100 Ankle - AH 9   40.9     Pop fossa AH 13.4  12.6  87.6 Pop fossa  - Ankle 37 44 ?41 37.2             SNC    Nerve / Sites Rec. Site Peak Lat Ref.  Amp Ref. Segments Distance    ms ms V V  cm  L Sural - Ankle (Calf)     Calf Ankle 4.1 ?4.4 10 ?6 Calf - Ankle 14  R Sural - Ankle (Calf)     Calf Ankle 4.0 ?4.4 12 ?6 Calf - Ankle 14  L Superficial peroneal - Ankle     Lat leg Ankle 3.6 ?4.4 9 ?6 Lat leg - Ankle 14  R Superficial peroneal - Ankle     Lat leg Ankle 4.2 ?4.4 8 ?6 Lat leg - Ankle 14              F  Wave    Nerve F Lat Ref.   ms ms  L Tibial - AH 48.1 ?56.0  R Tibial - AH 48.1 ?56.0  EMG full       EMG Summary Table    Spontaneous MUAP Recruitment  Muscle IA Fib PSW Fasc Other Amp Dur. Poly Pattern  L. Deltoid Normal None None None _______ Normal Normal Normal Normal  L. Biceps brachii Normal None None None _______ Normal Normal Normal Normal  L. Triceps brachii Normal None None None _______ Normal Normal Normal Normal  L. Flexor carpi radialis Normal None None None _______ Normal Normal Normal Normal  L. First dorsal interosseous Normal None None None _______ Normal Normal Normal Normal  L. Iliopsoas Normal None None None _______ Normal Normal Normal Normal  L. Vastus medialis Normal None None None _______ Normal Normal Normal Normal  L. Tibialis anterior Normal None None None _______ Normal Normal Normal Normal  L. Gastrocnemius (Medial head) Normal None None None _______ Normal Normal Normal Normal  L. Peroneus longus Normal None None None _______ Normal Normal Normal Normal

## 2018-06-01 ENCOUNTER — Other Ambulatory Visit: Payer: Self-pay | Admitting: Family Medicine

## 2018-06-01 ENCOUNTER — Telehealth: Payer: Self-pay

## 2018-06-01 DIAGNOSIS — G629 Polyneuropathy, unspecified: Secondary | ICD-10-CM

## 2018-06-01 NOTE — Telephone Encounter (Signed)
Copied from [place] [id number]. Topic: Quick Communication - See Telephone Encounter >> Jun 01, 2018  1:07 PM Antonieta Iba C wrote: CRM for notification. See Telephone encounter for: 06/01/18.  Pt called in to be advised. Pt says that she seen neurology on 05/26/18 (referred by PCP) pt says that neuro is requesting that she have labs for her muscles in 3-5 weeks in PCP office. Pt says that she already have a lab apt in the office scheduled for 07/22/18. Pt would like to know how should she go about things?   CB: [phone]

## 2018-06-01 NOTE — Telephone Encounter (Signed)
Pt has lab appt scheduled on 07/22/18 and on 07/26/18 a 3 mth FU appt with Dr Damita Dunnings is scheduled. Please advise.

## 2018-06-01 NOTE — Telephone Encounter (Signed)
It is okay with me to get it all done as scheduled.  I put in a follow-up CK level, which was the lab that neurology wanted to repeat (according to the notes in the EMR).  The other orders are already in.  Thanks.

## 2018-06-02 NOTE — Telephone Encounter (Signed)
Left detailed message on voicemail.  

## 2018-06-18 ENCOUNTER — Other Ambulatory Visit: Payer: Self-pay | Admitting: Diagnostic Neuroimaging

## 2018-06-20 MED ORDER — DULOXETINE HCL 30 MG PO CPEP: 60.0000 mg | ORAL_CAPSULE | Freq: Every day | ORAL | 0 refills | Status: DC

## 2018-06-20 MED ORDER — GABAPENTIN 300 MG PO CAPS: 600.0000 mg | ORAL_CAPSULE | Freq: Three times a day (TID) | ORAL | 0 refills | Status: DC

## 2018-06-20 NOTE — Telephone Encounter (Signed)
Per Dr Gladstone Lighter verbal orders, gabapentin 300 mg refilled x 1 month; Cymbalta changed to 30 mg daily to [facility]. Called patient and advised her and advised she see her PCP and Dr Maryjean Ka for future refills.  She verbalized understanding , appreciation.

## 2018-06-20 NOTE — Telephone Encounter (Signed)
Pt has called RN Stanton Kidney C back, she is asking for a call back [id number] refill

## 2018-06-20 NOTE — Telephone Encounter (Signed)
Spoke with patient to discuss gabapentin refill; she doesn't have a FU scheduled, and her NCS of left upper/lower extremities was normal. She stated she is still having back pain, and when it hits it is severe. She is taking Gabapentin 300 mg, tid but on days when her pain is worse she takes 600 mg tid. She also stated the Cymbalta 60 mg tab is too much for her, causes too much drowsiness. She would like to go back to 30 mg daily. She is scheduled with Dr Maryjean Ka for injections in her back in Nov. She stated he is using a different medicine. Her PCP has ordered a CK lab on her, and her FU with him is 07/22/18.  This RN advised will discuss with Dr Leta Baptist the refills requested. Advised her if he continues to refill, she must be seen for follow up at least yearly. Advised her he may refer her to Dr Maryjean Ka and PCP for refills, and FU. She verbalized understanding, appreciation.

## 2018-06-20 NOTE — Telephone Encounter (Signed)
LVM requesting call back to discuss Gabapentin refill.

## 2018-06-20 NOTE — Addendum Note (Signed)
Addended by: Minna Antis on: 06/20/2018 03:42 PM   Modules accepted: Orders

## 2018-06-20 NOTE — Telephone Encounter (Signed)
Ok to fill x 1 month; then transition to PCP or pain mgmt. -VRP

## 2018-07-08 DIAGNOSIS — H9193 Unspecified hearing loss, bilateral: Secondary | ICD-10-CM | POA: Diagnosis not present

## 2018-07-08 DIAGNOSIS — H6123 Impacted cerumen, bilateral: Secondary | ICD-10-CM | POA: Diagnosis not present

## 2018-07-13 DIAGNOSIS — H903 Sensorineural hearing loss, bilateral: Secondary | ICD-10-CM | POA: Diagnosis not present

## 2018-07-20 ENCOUNTER — Other Ambulatory Visit: Payer: Self-pay | Admitting: Otolaryngology

## 2018-07-20 DIAGNOSIS — H905 Unspecified sensorineural hearing loss: Secondary | ICD-10-CM

## 2018-07-21 DIAGNOSIS — M545 Low back pain: Secondary | ICD-10-CM | POA: Diagnosis not present

## 2018-07-22 ENCOUNTER — Other Ambulatory Visit (INDEPENDENT_AMBULATORY_CARE_PROVIDER_SITE_OTHER): Payer: Medicare HMO

## 2018-07-22 DIAGNOSIS — R739 Hyperglycemia, unspecified: Secondary | ICD-10-CM

## 2018-07-22 DIAGNOSIS — G629 Polyneuropathy, unspecified: Secondary | ICD-10-CM

## 2018-07-22 LAB — HEMOGLOBIN A1C: Hgb A1c MFr Bld: 6.6 % — ABNORMAL HIGH (ref 4.6–6.5)

## 2018-07-22 LAB — VITAMIN B12: Vitamin B-12: 884 pg/mL (ref 211–911)

## 2018-07-22 LAB — CK: Total CK: 249 U/L — ABNORMAL HIGH (ref 7–177)

## 2018-07-26 ENCOUNTER — Encounter: Payer: Self-pay | Admitting: Family Medicine

## 2018-07-26 ENCOUNTER — Ambulatory Visit (INDEPENDENT_AMBULATORY_CARE_PROVIDER_SITE_OTHER): Payer: Medicare HMO | Admitting: Family Medicine

## 2018-07-26 VITALS — BP 150/80 | HR 57 | Temp 98.6°F | Ht 62.0 in | Wt 155.2 lb

## 2018-07-26 DIAGNOSIS — R739 Hyperglycemia, unspecified: Secondary | ICD-10-CM

## 2018-07-26 DIAGNOSIS — R748 Abnormal levels of other serum enzymes: Secondary | ICD-10-CM

## 2018-07-26 DIAGNOSIS — M545 Low back pain, unspecified: Secondary | ICD-10-CM

## 2018-07-26 DIAGNOSIS — E119 Type 2 diabetes mellitus without complications: Secondary | ICD-10-CM | POA: Diagnosis not present

## 2018-07-26 MED ORDER — DULOXETINE HCL 30 MG PO CPEP: 30.0000 mg | ORAL_CAPSULE | Freq: Every day | ORAL | 3 refills | Status: DC

## 2018-07-26 NOTE — Progress Notes (Signed)
She is still having B upper thigh pain.  She recently had back injection done, last week, with some relief.  She feels better now than she has in a long time.  She has pain on standing and walking, better sitting.    She didn't tolerate 60mg  of cymbalta, with upset stomach.  30mg  helped some.  She is off med currently.  Discussed with patient about restarting 30 mg.  Diabetes:  No meds.   Hypoglycemic episodes: no Hyperglycemic episodes: no Feet problems: neuropathy at baseline.   Blood Sugars averaging: usually ~100.  Labs d/w pt.   She had eye exam done in 11/2017- Dr. Celene Squibb.  No retinopathy per patient report.    CK elevation previously noted, unclear source.  Some improvement with minimal residual elevation.    PMH and SH reviewed  Meds, vitals, and allergies reviewed.   ROS: Per HPI unless specifically indicated in ROS section   GEN: nad, alert and oriented HEENT: mucous membranes moist NECK: supple w/o LA CV: rrr. PULM: ctab, no inc wob ABD: soft, +bs EXT: no edema SKIN: no acute rash  Diabetic foot exam: Normal inspection No skin breakdown No calluses  Normal DP pulses Normal sensation to light touch and monofilament Nails normal

## 2018-07-26 NOTE — Patient Instructions (Signed)
Don't change your meds except for restarting the cymbalta.   Recheck labs in about 3 months prior to a visit.  Take care.  Glad to see you.

## 2018-07-30 DIAGNOSIS — R748 Abnormal levels of other serum enzymes: Secondary | ICD-10-CM | POA: Insufficient documentation

## 2018-07-30 NOTE — Assessment & Plan Note (Signed)
Unclear source.  This is improved compared to previous.  She has had elevated CK readings over the years.  It is possible for patient to have a mildly elevated CK that is a chronic lab finding but not an ominous concern.  Discussed with patient.  Since it is some better I would recheck in a few months.  She agrees.

## 2018-07-30 NOTE — Assessment & Plan Note (Signed)
She still having some bilateral upper thigh pain.  She recently had back injection done, with some relief.  We talked about restarting Cymbalta.  She did not tolerate 60 mg dose.  Likely reasonable to restart 30 mg dose.  Prescription sent.  Update me as needed.  She agrees. >25 minutes spent in face to face time with patient, >50% spent in counselling or coordination of care.

## 2018-07-30 NOTE — Assessment & Plan Note (Signed)
A1c controlled.  No change in meds.  Would recheck in 3 months.  She agrees.  Continue work on diet.

## 2018-08-03 ENCOUNTER — Ambulatory Visit
Admission: RE | Admit: 2018-08-03 | Discharge: 2018-08-03 | Disposition: A | Discharge status: Home / Self Care | Payer: Medicare HMO | Source: Ambulatory Visit | Attending: Otolaryngology | Admitting: Otolaryngology

## 2018-08-03 DIAGNOSIS — H905 Unspecified sensorineural hearing loss: Secondary | ICD-10-CM

## 2018-08-03 DIAGNOSIS — H903 Sensorineural hearing loss, bilateral: Secondary | ICD-10-CM | POA: Diagnosis not present

## 2018-08-03 MED ORDER — GADOBENATE DIMEGLUMINE 529 MG/ML IV SOLN
14.0000 mL | Freq: Once | INTRAVENOUS | Status: AC | PRN
  Administered 2018-08-03: 14 mL via INTRAVENOUS

## 2018-08-12 ENCOUNTER — Other Ambulatory Visit: Payer: Self-pay | Admitting: Cardiovascular Disease

## 2018-08-12 ENCOUNTER — Other Ambulatory Visit: Payer: Self-pay | Admitting: Family Medicine

## 2018-08-12 NOTE — Telephone Encounter (Signed)
Electronic refill request. Diazepam Last office visit:   07/26/18 Last Filled:   02/18/18        #20     0 RF Please advise.

## 2018-08-14 NOTE — Telephone Encounter (Signed)
Sent. Thanks.   

## 2018-08-22 ENCOUNTER — Other Ambulatory Visit: Payer: Self-pay | Admitting: Diagnostic Neuroimaging

## 2018-09-01 ENCOUNTER — Ambulatory Visit (INDEPENDENT_AMBULATORY_CARE_PROVIDER_SITE_OTHER): Payer: Medicare HMO | Admitting: Family Medicine

## 2018-09-01 ENCOUNTER — Encounter: Payer: Self-pay | Admitting: Family Medicine

## 2018-09-01 DIAGNOSIS — R21 Rash and other nonspecific skin eruption: Secondary | ICD-10-CM | POA: Diagnosis not present

## 2018-09-01 DIAGNOSIS — K219 Gastro-esophageal reflux disease without esophagitis: Secondary | ICD-10-CM | POA: Diagnosis not present

## 2018-09-01 MED ORDER — OMEPRAZOLE 20 MG PO CPDR: 20.0000 mg | DELAYED_RELEASE_CAPSULE | Freq: Every day | ORAL | 3 refills | Status: DC

## 2018-09-01 MED ORDER — CLOTRIMAZOLE-BETAMETHASONE 1-0.05 % EX CREA: 1.0000 "application " | TOPICAL_CREAM | Freq: Two times a day (BID) | CUTANEOUS | 0 refills | Status: DC

## 2018-09-01 NOTE — Patient Instructions (Signed)
Try using lotrisone on your scalp and back.  Ask dermatology for their input too.  Stop zantac and change to omeprazole.  See if that helps and if you can tolerate it.  Take care.  Glad to see you.

## 2018-09-01 NOTE — Progress Notes (Signed)
Itching. Predates stopping gabapentin.  She didn't know if the sx were related to prev injection per Dr. Maryjean Ka' clinic but that would be unlikely, discussed with patient.  R side of scalp > right side of back affected.  Flaky scalp.  Triamcinolone helps minimally on the scalp irritation.    She has been out of gabapentin for about 1 week.  She is putting up with minimal aches.    She is off ranitidine in the meantime due to recall, d/w pt about possible PPI restart.    She has a derm appointment tomorrow.    Meds, vitals, and allergies reviewed.   ROS: Per HPI unless specifically indicated in ROS section   nad ncat Well demarcated irritation on the R scalp.  No ulceration.  Flaky skin noted. Minimally irritation on the R side of the back, not dermatomal.

## 2018-09-02 DIAGNOSIS — L820 Inflamed seborrheic keratosis: Secondary | ICD-10-CM | POA: Diagnosis not present

## 2018-09-02 DIAGNOSIS — L308 Other specified dermatitis: Secondary | ICD-10-CM | POA: Diagnosis not present

## 2018-09-04 DIAGNOSIS — K219 Gastro-esophageal reflux disease without esophagitis: Secondary | ICD-10-CM | POA: Insufficient documentation

## 2018-09-04 DIAGNOSIS — R21 Rash and other nonspecific skin eruption: Secondary | ICD-10-CM | POA: Insufficient documentation

## 2018-09-04 NOTE — Assessment & Plan Note (Signed)
She can try changing from ranitidine to omeprazole given the recent recall.  Discussed.  Update me as needed.  She agrees.

## 2018-09-04 NOTE — Assessment & Plan Note (Signed)
Triamcinolone did not help the scalp changes at all.  I question if she has a fungal component.  Reasonable to try Lotrisone just to see if that helps.  She has follow-up with dermatology pending and I think is reasonable to get their input also.  Discussed with patient.  She agrees.  She can also try Lotrisone on the rash on her lower back.  That does not appear typical for shingles.

## 2018-09-12 ENCOUNTER — Other Ambulatory Visit: Payer: Self-pay | Admitting: *Deleted

## 2018-09-12 NOTE — Telephone Encounter (Signed)
Last OV 09/01/2018, Last Rx 08/14/2018

## 2018-09-13 ENCOUNTER — Other Ambulatory Visit: Payer: Self-pay | Admitting: Family Medicine

## 2018-09-13 MED ORDER — DIAZEPAM 2 MG PO TABS: ORAL_TABLET | ORAL | 0 refills | Status: DC

## 2018-09-13 NOTE — Telephone Encounter (Signed)
Sent. Thanks.   

## 2018-09-13 NOTE — Telephone Encounter (Signed)
Electronic refill request. Diazepam Last office visit:   09/01/2018 Last Filled:

## 2018-09-22 ENCOUNTER — Ambulatory Visit (INDEPENDENT_AMBULATORY_CARE_PROVIDER_SITE_OTHER): Payer: Medicare HMO | Admitting: Family Medicine

## 2018-09-22 ENCOUNTER — Encounter: Payer: Self-pay | Admitting: Family Medicine

## 2018-09-22 DIAGNOSIS — M48061 Spinal stenosis, lumbar region without neurogenic claudication: Secondary | ICD-10-CM | POA: Diagnosis not present

## 2018-09-22 DIAGNOSIS — F4321 Adjustment disorder with depressed mood: Secondary | ICD-10-CM

## 2018-09-22 DIAGNOSIS — R69 Illness, unspecified: Secondary | ICD-10-CM | POA: Diagnosis not present

## 2018-09-22 MED ORDER — GABAPENTIN 300 MG PO CAPS: 300.0000 mg | ORAL_CAPSULE | Freq: Two times a day (BID) | ORAL | 1 refills | Status: DC

## 2018-09-22 NOTE — Patient Instructions (Addendum)
Take gabapentin 1 pill once a day for about 3 days, then increase to 1 pill twice a day.   See if that helps the back pain.   I am profoundly sorry and I will be thinking about your and your family.   Take care.  Glad to see you.

## 2018-09-22 NOTE — Progress Notes (Signed)
She had been out of gabapentin for a few weeks.  More back pain in the meantime, can radiate into both legs.  She is going to see Dr. Maryjean Ka at the back clinic next month.  No trauma.  No new symptoms otherwise.  No leg weakness.  Her prev rash is resolved.    Her daughter died on the 11/20/2022.  She is dealing with a lot of grief but does not have any suicidal homicidal intent.  She had noticed that she has had some hair loss in the interval, that she attributed to the stress.  She wasn't using valium recently, discussed with patient.  She is tearful, having trouble concentrating.  She has decreased motivation.  She is sad.  She has family support.  Meds, vitals, and allergies reviewed.   ROS: Per HPI unless specifically indicated in ROS section   GEN: nad, alert and oriented but tearful, regains composure. HEENT: mucous membranes moist NECK: supple w/o LA CV: rrr. PULM: ctab, no inc wob ABD: soft, +bs EXT: no edema SKIN: Well-perfused

## 2018-09-25 NOTE — Assessment & Plan Note (Signed)
Restart gabapentin with routine cautions, see after visit summary, and she will follow-up with Dr. Maryjean Ka.  She agrees with plan.

## 2018-09-25 NOTE — Assessment & Plan Note (Signed)
Her daughter recently died.  Discussed recent events.  She does not have to use any medication at this point unless she is having refractory symptoms that would dictate otherwise.  Discussed.  She can update me as needed.  Offered my support.  She has family support.  She thanked me for all that.  She still okay for outpatient follow-up and she will update me as needed.  We talked about routine cautions for diazepam and if she ends up needing extra medicine for anxiety in the future we can help her with restarting this or with another medication.

## 2018-10-04 ENCOUNTER — Ambulatory Visit (INDEPENDENT_AMBULATORY_CARE_PROVIDER_SITE_OTHER): Payer: Medicare HMO | Admitting: Family Medicine

## 2018-10-04 ENCOUNTER — Encounter: Payer: Self-pay | Admitting: Family Medicine

## 2018-10-04 DIAGNOSIS — F4321 Adjustment disorder with depressed mood: Secondary | ICD-10-CM

## 2018-10-04 DIAGNOSIS — R69 Illness, unspecified: Secondary | ICD-10-CM | POA: Diagnosis not present

## 2018-10-04 MED ORDER — ONDANSETRON HCL 4 MG PO TABS: 4.0000 mg | ORAL_TABLET | Freq: Three times a day (TID) | ORAL | 1 refills | Status: DC | PRN

## 2018-10-04 MED ORDER — VENLAFAXINE HCL 37.5 MG PO TABS: 37.5000 mg | ORAL_TABLET | Freq: Two times a day (BID) | ORAL | 1 refills | Status: DC

## 2018-10-04 NOTE — Progress Notes (Signed)
"  I need some help.  I don't feel like doing anything."  No SI/HI.  Lack of appetite.  Nausea.  Tearful.  Trouble concentrating.  Not sleeping well recently, but valium helped some with sleep.  Husband supportive.  Noted recent history about the death of her daughter.  Discussed.  She did want to go through counseling at hospice, since that would be a reminder of other losses.  We talked about her talking to her pastor.  She had been avoiding going to church because she did not want to be around a crowd of people.  We talked about ways to manage this.    Meds, vitals, and allergies reviewed.   ROS: Per HPI unless specifically indicated in ROS section   GEN: nad, alert and oriented but tearful.  Flat affect.  Her speech is coherent and she is not sedated.  She can regain her composure. HEENT: mucous membranes moist NECK: supple w/o LA CV: rrr. PULM: ctab, no inc wob ABD: soft, +bs EXT: no edema SKIN: Well-perfused

## 2018-10-04 NOTE — Patient Instructions (Signed)
Please call your pastor and see about seeing up an appointment to talk on a day other than Sunday, to avoid the crowds at church.   Use zofran as needed for nausea.   Start taking venlafaxine once daily for 4 days. Then try to increase to twice a day thereafter.  Update me as needed in the meantime.  I'd like to hear from you next week, once you have been on the medication.   Take care.  Glad to see you.

## 2018-10-05 NOTE — Assessment & Plan Note (Signed)
At this point still okay for outpatient follow-up.    She will call her pastor and see about seeing up an appointment to talk on a day other than Sunday, to avoid the crowds at church.   Use zofran as needed for nausea.   Start taking venlafaxine once daily for 4 days. Then try to increase to twice a day thereafter.  Update me as needed in the meantime.  I'd like to hear from her next week, once she has been on the medication.  She agrees.  >25 minutes spent in face to face time with patient, >50% spent in counselling or coordination of care  .

## 2018-10-13 ENCOUNTER — Telehealth: Payer: Self-pay | Admitting: Family Medicine

## 2018-10-13 NOTE — Telephone Encounter (Addendum)
Patient says she thinks she is doing better.  She has felt better today than she has in a long time.  She is able to eat some now whereas before, she would take a few bites and couldn't eat any more.  Patient advised to continue as is unless otherwise advised.

## 2018-10-13 NOTE — Telephone Encounter (Signed)
Please get an update on patient after starting venlafaxine.  Let me know how she is doing.  Thanks.

## 2018-10-13 NOTE — Telephone Encounter (Signed)
Yes, agreed.  Continue as is.  Update [place] if she doesn't continue to improve (or if her mood worsens).  Thanks.

## 2018-10-18 DIAGNOSIS — Z6828 Body mass index (BMI) 28.0-28.9, adult: Secondary | ICD-10-CM | POA: Diagnosis not present

## 2018-10-18 DIAGNOSIS — I10 Essential (primary) hypertension: Secondary | ICD-10-CM | POA: Diagnosis not present

## 2018-10-25 ENCOUNTER — Other Ambulatory Visit: Payer: Medicare HMO

## 2018-10-27 ENCOUNTER — Encounter: Payer: Self-pay | Admitting: Family Medicine

## 2018-10-27 NOTE — Telephone Encounter (Signed)
Please advise. Any recommendations or should patient be seen?

## 2018-10-28 ENCOUNTER — Ambulatory Visit: Payer: Medicare HMO | Admitting: Family Medicine

## 2018-10-28 ENCOUNTER — Telehealth: Payer: Self-pay | Admitting: Family Medicine

## 2018-10-28 NOTE — Telephone Encounter (Signed)
Skip her HCTZ until she is feeling better.  Can try pedialyte. We don't have IV fluids to give patient here.  If persistently lightheaded, then needs ER vs UC eval for consideration of IV fluids.   I hopes she feels better soon.   Thanks.

## 2018-10-28 NOTE — Telephone Encounter (Signed)
I saw other note but Larene Beach CMA has already spoken with pt.

## 2018-10-28 NOTE — Telephone Encounter (Signed)
Spoke to pt. She said she will go somewhere and be seen if she is not feeling any better later this afternoon.

## 2018-10-28 NOTE — Telephone Encounter (Signed)
Pt calling after eating meatloaf at a restaurant 10/24/18; having N&V& diarrhea since ate meatloaf; pt said diarrhea and vomiting stopped on 10/27/18 at 5:30 pm; pt is so weak pt can hardly stand up; pt tried to drink gatorade and coke but felt lost liquids thru vomiting or diarrhea. No fever. Mouth is dry. Pt said she cannot go to ED because her daughter recently passed away in an ED. Offered pt an appt at [facility] [place] and she said she is so weak she cannot go anywhere. Pt wants to know if Dr Damita Dunnings can suggest something to drink other than Gatorade in case pt is dehydrated. Pt request cb. [facility].

## 2018-10-28 NOTE — Telephone Encounter (Signed)
Please triage patient.  See my chart message.  Thanks. 

## 2018-10-28 NOTE — Telephone Encounter (Signed)
Correction, see other note.  Thanks.

## 2018-11-03 ENCOUNTER — Other Ambulatory Visit (INDEPENDENT_AMBULATORY_CARE_PROVIDER_SITE_OTHER): Payer: Medicare HMO

## 2018-11-03 DIAGNOSIS — R748 Abnormal levels of other serum enzymes: Secondary | ICD-10-CM | POA: Diagnosis not present

## 2018-11-03 DIAGNOSIS — E119 Type 2 diabetes mellitus without complications: Secondary | ICD-10-CM | POA: Diagnosis not present

## 2018-11-03 LAB — MICROALBUMIN / CREATININE URINE RATIO
Creatinine,U: 244.7 mg/dL
Microalb Creat Ratio: 4.8 mg/g (ref 0.0–30.0)
Microalb, Ur: 11.7 mg/dL — ABNORMAL HIGH (ref 0.0–1.9)

## 2018-11-03 LAB — HEMOGLOBIN A1C: Hgb A1c MFr Bld: 6.6 % — ABNORMAL HIGH (ref 4.6–6.5)

## 2018-11-03 LAB — CK: Total CK: 166 U/L (ref 7–177)

## 2018-11-04 ENCOUNTER — Other Ambulatory Visit: Payer: Medicare HMO

## 2018-11-07 ENCOUNTER — Encounter: Payer: Self-pay | Admitting: Family Medicine

## 2018-11-07 ENCOUNTER — Ambulatory Visit (INDEPENDENT_AMBULATORY_CARE_PROVIDER_SITE_OTHER): Payer: Medicare HMO | Admitting: Family Medicine

## 2018-11-07 DIAGNOSIS — R809 Proteinuria, unspecified: Secondary | ICD-10-CM | POA: Diagnosis not present

## 2018-11-07 DIAGNOSIS — F4321 Adjustment disorder with depressed mood: Secondary | ICD-10-CM

## 2018-11-07 DIAGNOSIS — R69 Illness, unspecified: Secondary | ICD-10-CM | POA: Diagnosis not present

## 2018-11-07 DIAGNOSIS — M545 Low back pain, unspecified: Secondary | ICD-10-CM

## 2018-11-07 DIAGNOSIS — E1129 Type 2 diabetes mellitus with other diabetic kidney complication: Secondary | ICD-10-CM | POA: Diagnosis not present

## 2018-11-07 DIAGNOSIS — R748 Abnormal levels of other serum enzymes: Secondary | ICD-10-CM

## 2018-11-07 MED ORDER — LISINOPRIL 2.5 MG PO TABS: 2.5000 mg | ORAL_TABLET | Freq: Every day | ORAL | 3 refills | Status: DC

## 2018-11-07 MED ORDER — GABAPENTIN 300 MG PO CAPS: 300.0000 mg | ORAL_CAPSULE | Freq: Two times a day (BID) | ORAL | Status: DC | PRN

## 2018-11-07 NOTE — Progress Notes (Signed)
Diabetes:  No meds.   Hypoglycemic episodes: no Hyperglycemic episodes:no Feet problems: no Blood Sugars averaging: not checked.   eye exam within last year: due, d/w pt.  She is going in April.   A1c controlled, MALB positive.   ACE use and cautions d/w pt.    CK normalized.  D/w pt.    Her mood is some better, she looks brighter today, d/w pt about her situation with the recent death of her daughter.  She was nauseated either from the grief or from effexor or from both.  She is off effexor now.  D/w pt, she felt better.  Nausea is resolved.  Intolerance list updated.  Husband supportive at home.  Pain is better, only using gabapentin as needed.    PMH and SH reviewed  Meds, vitals, and allergies reviewed.   ROS: Per HPI unless specifically indicated in ROS section   GEN: nad, alert and oriented HEENT: mucous membranes moist NECK: supple w/o LA CV: rrr. PULM: ctab, no inc wob ABD: soft, +bs EXT: no edema SKIN: well perfused.

## 2018-11-07 NOTE — Patient Instructions (Addendum)
Recheck labs at a visit in about 3 months.  Keep taking your regular meds but add on 2.5mg  of lisinopril, 1 pill a day.  Take care.  Glad to see you.  Update me as needed.

## 2018-11-09 ENCOUNTER — Encounter: Payer: Self-pay | Admitting: Family Medicine

## 2018-11-09 NOTE — Assessment & Plan Note (Signed)
A1c controlled, MALB positive.   ACE use and cautions d/w pt.   Start lisinopril.  Update me as needed.  She agrees.

## 2018-11-09 NOTE — Assessment & Plan Note (Signed)
CK normalized.  D/w pt. no further work-up at this point.

## 2018-11-09 NOTE — Assessment & Plan Note (Signed)
Pain is better, only using gabapentin as needed.  Continue as is.  She agrees.

## 2018-11-09 NOTE — Assessment & Plan Note (Signed)
Her mood is some better, she looks brighter today, d/w pt about her situation with the recent death of her daughter.  She was nauseated either from the grief or from effexor or from both.  She is off effexor now.  D/w pt, she felt better.  Nausea is resolved.  Intolerance list updated.  Husband supportive at home. >25 minutes spent in face to face time with patient, >50% spent in counselling or coordination of care.

## 2018-11-10 ENCOUNTER — Telehealth: Payer: Self-pay

## 2018-11-10 NOTE — Telephone Encounter (Signed)
Pt was seen by Dr Damita Dunnings first of wk and pt started was Lisinopril 2.5 mg one daily; pt takes Lisinopril at night. Pt also takes HCTZ once daily in AM and Carvedilol bid. Pt  Has taken Carvedilol this morning but has not taken HCTZ or Lisinopril. Pt to hold HCTZ and Lisinopril until receives cb. BP today 97/47; pt is slightly dizzy when sitting and more dizziness when up moving around. No H/A,CP or SOB. Advised pt when getting up out of chair or bed to rise slowly and to be careful in walking if dizzy. Dr Damita Dunnings advised pt to stop HCTZ and Lisinopril for now. Increase PO fluids,if worse go to ED and update [place] 11/11/18. Pt voiced understanding. Copy of Dr Josefine Class written instructions sent for scanning.

## 2018-11-11 MED ORDER — HYDROCHLOROTHIAZIDE 12.5 MG PO TABS: ORAL_TABLET | ORAL | Status: DC

## 2018-11-11 MED ORDER — LISINOPRIL 2.5 MG PO TABS: ORAL_TABLET | ORAL | Status: DC

## 2018-11-11 NOTE — Telephone Encounter (Signed)
Pt said since stopping med pt is feeling better; pt feels weak but no dizziness. Pt said last night BP was 105/44; pt did not take Lisinopril or Carvedilol last night. Pt has not taken Lisinopril, HCTZ or Carvedilol this morning. BP this morning is 135/58 P 63. Pt request cb after Dr Damita Dunnings reviews. [facility].

## 2018-11-11 NOTE — Telephone Encounter (Signed)
I would hold lisinopril and HCTZ over the weekend.   If possible, take carvedilol BID.  If lightheaded, then skip carvedilol.  Update me about her BP and amount of carvedilol use early next week.  Thanks.

## 2018-11-11 NOTE — Telephone Encounter (Signed)
Patient advised.

## 2018-11-11 NOTE — Addendum Note (Signed)
Addended by: Tonia Ghent on: 11/11/2018 02:09 PM   Modules accepted: Orders

## 2018-12-23 ENCOUNTER — Telehealth: Payer: Self-pay | Admitting: *Deleted

## 2018-12-23 MED ORDER — FLUOCINOLONE ACETONIDE SCALP 0.01 % EX OIL: 1.0000 "application " | TOPICAL_OIL | Freq: Every day | CUTANEOUS | 0 refills | Status: DC

## 2018-12-23 NOTE — Telephone Encounter (Signed)
What is she currently doing for it? How much area is affected? Let me know and I'll see about options.  Thanks.

## 2018-12-23 NOTE — Telephone Encounter (Signed)
Left message for patient to call back to discuss.

## 2018-12-23 NOTE — Telephone Encounter (Signed)
Patient left a voicemail stating that she has psorisasis in her head. Patient stated that she has seen Dr. Damita Dunnings for this in the past and wants to know what he recommends that she do for this?

## 2018-12-23 NOTE — Telephone Encounter (Signed)
Spoke with patient. Area of break out is all on the right side of the head (ear to the center of the back of her head, some near her forehead and seems to spread. She has used cortisone cream (it seems to make it worse and makes it spread and itch), used it for about 2 days. She saw Dr. [place] Crane for this as you suggested previously and he gave her tar shampoo and Tropical liquid to use but it made the area burn and symptoms get worse. Right now the area is really flacky, red, raw and itches so bad. She does have a little bit on her back also. Patient states she knows it is psoriasis. Her daughter died in Sep 25, 2022 and her nerves have been bad, anxiety and since then the break out began. Please review and let patient know before leaving today. Thank you

## 2018-12-23 NOTE — Telephone Encounter (Signed)
rx sent with steroid cautions and she'll update me if not better.   She can stop use when better.   She agrees with plan.  She thanked me for the call.

## 2018-12-30 ENCOUNTER — Encounter: Payer: Self-pay | Admitting: Family Medicine

## 2018-12-30 ENCOUNTER — Ambulatory Visit (INDEPENDENT_AMBULATORY_CARE_PROVIDER_SITE_OTHER): Payer: Medicare HMO | Admitting: Family Medicine

## 2018-12-30 DIAGNOSIS — R21 Rash and other nonspecific skin eruption: Secondary | ICD-10-CM | POA: Diagnosis not present

## 2018-12-30 MED ORDER — PREDNISONE 10 MG PO TABS: ORAL_TABLET | ORAL | 0 refills | Status: DC

## 2018-12-30 NOTE — Progress Notes (Signed)
Interactive audio and video telecommunications were attempted between this provider and patient, however failed, due to patient having technical difficulties OR patient did not have access to video capability.  We continued and completed visit with audio only.   .Virtual Visit via Telephone Note  I connected with patient on 12/30/18 at 2:55 by telephone and verified that I am speaking with the correct person using two identifiers.  Location of patient: home  Location of MD: [place] Name of referring provider (if blank then none associated): Names per persons and role in encounter:  MD: Earlyne Iba, Patient: name listed above.    I discussed the limitations, risks, security and privacy concerns of performing an evaluation and management service by telephone and the availability of in person appointments. I also discussed with the patient that there may be a patient responsible charge related to this service. The patient expressed understanding and agreed to proceed.  History of Present Illness: rash. On scalp and back.  Started fluocinolone topical oil, that helped with itching temporarily.  But then the symptoms would return, with return of itching.  She also has some "pimples"/lesions scattered across her scalp.  Her back has a few small lesions noted.    Observations/Objective:nad Speech wnl.   Assessment and Plan: Itchy rash, bilateral per patient report, would try prednisone with food and then update me about her situation.  She has a partial response to topical steroids.  She does not have ominous symptoms at this point and it is reasonable to try prednisone with routine cautions  She'll send me a picture of rash via mychart.    Follow Up Instructions: See above.   I discussed the assessment and treatment plan with the patient. The patient was provided an opportunity to ask questions and all were answered. The patient agreed with the plan and demonstrated an  understanding of the instructions.   The patient was advised to call back or seek an in-person evaluation if the symptoms worsen or if the condition fails to improve as anticipated.  I provided 12 minutes of non-face-to-face time during this encounter.   Elsie Stain, MD

## 2019-01-01 NOTE — Assessment & Plan Note (Signed)
Itchy rash, would try prednisone with food and then update me about her situation.  She has a partial response to topical steroids.  She does not have ominous symptoms at this point and it is reasonable to try prednisone with routine cautions  She'll send me a picture of rash via mychart.

## 2019-01-06 ENCOUNTER — Telehealth: Payer: Self-pay | Admitting: *Deleted

## 2019-01-06 NOTE — Telephone Encounter (Signed)
Patient left a voicemail stating that she has been on Prednisone and it has really helped. Patient stated that she is feeling good.

## 2019-01-06 NOTE — Telephone Encounter (Signed)
Patient advised.

## 2019-01-06 NOTE — Telephone Encounter (Signed)
Noted. Glad to hear.  Would finish the rx and then update me as needed.  Thanks.

## 2019-01-15 ENCOUNTER — Other Ambulatory Visit: Payer: Self-pay | Admitting: Family Medicine

## 2019-01-16 ENCOUNTER — Telehealth: Payer: Self-pay | Admitting: *Deleted

## 2019-01-16 MED ORDER — PREDNISONE 10 MG PO TABS: ORAL_TABLET | ORAL | 0 refills | Status: DC

## 2019-01-16 NOTE — Telephone Encounter (Signed)
Hopefully while still on med, prior to resolution of sx.  Thanks.

## 2019-01-16 NOTE — Telephone Encounter (Signed)
Can you schedule her with Dr Damita Dunnings in office this week, please?

## 2019-01-16 NOTE — Telephone Encounter (Signed)
I would restart prednisone.  And we either need to get her over to dermatology or OV here in person to check this later on.  Thanks.  rx sent.

## 2019-01-16 NOTE — Telephone Encounter (Signed)
Spoke to pt. When should she make OV here: after she finished the Prednisone or while she is still on it?

## 2019-01-16 NOTE — Telephone Encounter (Signed)
Patient left a voicemail stating that she is having the same problem with breaking out on her back and head. Patient stated that she has had this before and that Dr. Damita Dunnings is aware of this problem.

## 2019-01-16 NOTE — Telephone Encounter (Signed)
Pt wanted Dr. Damita Dunnings to know that her husband is in the hospital (throwing up blood) and she would come tomorrow but she can't make it. I scheduled her for Thursday 01/19/19 @ 9am.

## 2019-01-17 ENCOUNTER — Encounter: Payer: Self-pay | Admitting: Family Medicine

## 2019-01-17 ENCOUNTER — Ambulatory Visit (INDEPENDENT_AMBULATORY_CARE_PROVIDER_SITE_OTHER): Payer: Medicare HMO | Admitting: Family Medicine

## 2019-01-17 ENCOUNTER — Other Ambulatory Visit: Payer: Self-pay

## 2019-01-17 VITALS — BP 138/76 | HR 78 | Temp 98.2°F | Ht 62.0 in | Wt 159.0 lb

## 2019-01-17 DIAGNOSIS — R21 Rash and other nonspecific skin eruption: Secondary | ICD-10-CM

## 2019-01-17 MED ORDER — LISINOPRIL 2.5 MG PO TABS: 2.5000 mg | ORAL_TABLET | Freq: Every day | ORAL | Status: DC

## 2019-01-17 NOTE — Telephone Encounter (Signed)
Pt changed appt date to today 01/17/19.

## 2019-01-17 NOTE — Telephone Encounter (Signed)
She is on the schedule today at 1030. Is that correct?

## 2019-01-17 NOTE — Progress Notes (Signed)
Rash.  Prev responded to prednisone, no itching and no lesions seen after treatment.  She had incomplete relief with topical steroid orals on the scalp previously.  Her symptoms returned when off med. Restarted prednisone in the meantime.  No ADE on med prednisone other than some weight gain.  Appetite is still at baseline.    Nephew had psoriasis, hx noted.    Meds, vitals, and allergies reviewed.   ROS: Per HPI unless specifically indicated in ROS section   nad ncat Flaky skin noted on the R posterior lateral scalp but no severe lesions noted- already some better on prednisone.  Neck supple, no lymphadenopathy. rrr ctab Back with small scattered nondermatomal areas of flaking dry skin noted.  No ulceration.

## 2019-01-17 NOTE — Patient Instructions (Signed)
I think this is an issue with recurrent skin irritation (it could be psoriasis or eczema or similar).   This doesn't look infectious.  Use the prednisone for now with food.   Update me as needed.  Take care.  Glad to see you.

## 2019-01-18 NOTE — Assessment & Plan Note (Signed)
She likely has recurrent inflammatory skin condition that does not appear ominous but is annoying to the patient. This doesn't look infectious.  Use the prednisone for now with food.  Routine cautions given to patient. Update me as needed.  Refer to dermatology.  She agrees.

## 2019-01-19 ENCOUNTER — Ambulatory Visit: Payer: Medicare HMO | Admitting: Family Medicine

## 2019-01-19 DIAGNOSIS — L218 Other seborrheic dermatitis: Secondary | ICD-10-CM | POA: Diagnosis not present

## 2019-01-19 DIAGNOSIS — B9689 Other specified bacterial agents as the cause of diseases classified elsewhere: Secondary | ICD-10-CM | POA: Diagnosis not present

## 2019-01-19 DIAGNOSIS — R234 Changes in skin texture: Secondary | ICD-10-CM | POA: Diagnosis not present

## 2019-01-19 DIAGNOSIS — L02821 Furuncle of head [any part, except face]: Secondary | ICD-10-CM | POA: Diagnosis not present

## 2019-01-31 ENCOUNTER — Telehealth: Payer: Self-pay

## 2019-01-31 ENCOUNTER — Ambulatory Visit (INDEPENDENT_AMBULATORY_CARE_PROVIDER_SITE_OTHER): Payer: Medicare HMO

## 2019-01-31 DIAGNOSIS — Z0000 Encounter for general adult medical examination without abnormal findings: Secondary | ICD-10-CM | POA: Diagnosis not present

## 2019-01-31 MED ORDER — TRAMADOL HCL 50 MG PO TABS: 50.0000 mg | ORAL_TABLET | Freq: Two times a day (BID) | ORAL | 1 refills | Status: DC | PRN

## 2019-01-31 NOTE — Progress Notes (Signed)
PCP notes:   Health maintenance:  Eye exam - postponed  Abnormal screenings:   Depression score: 8             Depression screen [facility] 2/9 01/31/2019 01/11/2018 01/07/2017  Decreased Interest 0 0 3  Down, Depressed, Hopeless 3 0 3  PHQ - 2 Score 3 0 6  Altered sleeping 3 0 3  Tired, decreased energy 0 0 3  Change in appetite 1 0 3  Feeling bad or failure about yourself  0 0 0  Trouble concentrating 1 0 0  Moving slowly or fidgety/restless 0 0 0  Suicidal thoughts 0 0 0  PHQ-9 Score 8 0 15  Difficult doing work/chores Somewhat difficult Not difficult at all Somewhat difficult  Some recent data might be hidden     Patient concerns:   Lower back and bilateral lower extremity pain  - 7/10, radiating pressure. PCP notified  Nurse concerns:  None  Next PCP appt:   02/03/19 @ 0845  I reviewed health advisor's note, was available for consultation on the day of service listed in this note, and agree with documentation and plan. Elsie Stain, MD.

## 2019-01-31 NOTE — Telephone Encounter (Signed)
Med list updated.  Sent. Thanks.  Update me as needed.

## 2019-01-31 NOTE — Telephone Encounter (Signed)
Left message on voicemail for patient to call back. 

## 2019-01-31 NOTE — Telephone Encounter (Signed)
Per EMR, she had stopped tramadol prev due to sedation.  It was listed as an intolerance.  Is she still taking gabapentin?  If so, please check on the dose she is taking.  Is that helping at all?  Is she tolerating that?   We may need to increase gabapentin.  Let me know. Thanks.

## 2019-01-31 NOTE — Progress Notes (Signed)
Subjective:   Catherine Green is a 78 y.o. female who presents for Medicare Annual (Subsequent) preventive examination.  Review of Systems:  N/A Cardiac Risk Factors include: advanced age (>62men, >36 women);hypertension     Objective:     Vitals: There were no vitals taken for this visit.  There is no height or weight on file to calculate BMI.  Advanced Directives 01/31/2019 01/11/2018 02/17/2017 01/07/2017 02/18/2016 08/20/2015 04/11/2015  Does Patient Have a Medical Advance Directive? Yes Yes Yes Yes Yes Yes No  Type of Paramedic of [place];Living will [place];Living will [place];Living will [place];Living will Living will;Healthcare Power of [place];Living will -  Does patient want to make changes to medical advance directive? No - Patient declined - - - - - -  Copy of [place] in Chart? No - copy requested No - copy requested - No - copy requested No - copy requested - -  Would patient like information on creating a medical advance directive? No - Patient declined - - - - - Yes - Scientist, clinical (histocompatibility and immunogenetics) given    Tobacco Social History   Tobacco Use  Smoking Status Former Smoker  . Packs/day: 0.30  . Years: 15.00  . Pack years: 4.50  . Types: Cigarettes  . Last attempt to quit: 08/31/1996  . Years since quitting: 22.4  Smokeless Tobacco Never Used     Counseling given: No   Clinical Intake:  Pre-visit preparation completed: Yes  Pain : 0-10 Pain Score: 7  Pain Type: Chronic pain Pain Location: Back(bilateral legs) Pain Orientation: Lower, Right, Left Pain Descriptors / Indicators: Pressure, Radiating Pain Onset: More than a month ago Pain Frequency: Intermittent     Nutritional Status: BMI 25 -29 Overweight Nutritional Risks: None  How often do you need to have someone help you when you read instructions, pamphlets, or other  written materials from your doctor or pharmacy?: 1 - Never What is the last grade level you completed in school?: 11th grade  Interpreter Needed?: No  Comments: pt lives with spouse Information entered by :: LPinson, LPN  Past Medical History:  Diagnosis Date  . Allergy    hay fever  . Anemia   . Anxiety   . Back pain   . Carotid artery occlusion   . Cerebrovascular disease    extracranial; occlusive  . Chicken pox   . Coronary artery disease   . Depression   . Dizziness   . Dizziness   . DVT (deep venous thrombosis) ([place])   . Fainting spell   . GERD (gastroesophageal reflux disease)   . Headache   . Heart murmur   . Hyperlipidemia   . Hypertension   . Leg pain   . PONV (postoperative nausea and vomiting)    severe nausea and vomiting  . Pre-syncope   . PVD (peripheral vascular disease) ([place])    endarterectomy by [name]  . Seasonal allergies   . Shortness of breath dyspnea    wth ambulation at times  . Swelling of both ankles    and abdomen; takes Lasix when needed  . Thrombophlebitis    following childbirth  . Ulcer    Past Surgical History:  Procedure Laterality Date  . APPENDECTOMY    . BACK SURGERY  01/14/16  . BREAST SURGERY     saline implants  . CARDIAC CATHETERIZATION    . CAROTID ENDARTERECTOMY  1992  .  CAROTID ENDARTERECTOMY Right February 13, 2005   Re-do Right CE  . CATARACT EXTRACTION, BILATERAL  2013  . CHOLECYSTECTOMY, LAPAROSCOPIC  04/02/14   Dr. Rochel Brome  . CORONARY ARTERY BYPASS GRAFT  2006   x3 Dr. Prescott Gum  . ENDARTERECTOMY Left 07/20/2014   Procedure: ENDARTERECTOMY CAROTID-LEFT;  Surgeon: Rosetta Posner, MD;  Location: [facility];  Service: Vascular;  Laterality: Left;  . EYE SURGERY Bilateral Feb. 2013   Cataract Left eye  . SEPTOPLASTY     with bilateral inferior turbinate reductions  . SPINE SURGERY  12/2015  . TOTAL ABDOMINAL HYSTERECTOMY    . UPPER GASTROINTESTINAL ENDOSCOPY     had polyps removed from esophagus  . UPPER GI  ENDOSCOPY     Family History  Problem Relation Age of Onset  . Aneurysm Mother        brain  . Heart disease Mother        Aneyursm   . Hyperlipidemia Mother   . Hypertension Mother   . Varicose Veins Mother   . Bleeding Disorder Mother   . Heart disease Father   . Cirrhosis Father   . Heart attack Father   . Hyperlipidemia Father   . Hypertension Father   . Heart disease Brother        Before age 14  . Aneurysm Brother   . Deep vein thrombosis Brother   . Birth defects Brother   . Hyperlipidemia Brother   . Hypertension Brother   . Hypertension Other   . Cancer Sister        lung  . Heart disease Sister        Aneurysm  . Hyperlipidemia Sister   . Hypertension Sister   . Varicose Veins Sister   . Peripheral vascular disease Daughter   . Hyperlipidemia Daughter   . Hypertension Daughter   . Bleeding Disorder Sister   . Breast cancer Neg Hx   . Colon cancer Neg Hx    Social History   Socioeconomic History  . Marital status: Married    Spouse name: Catherine Green   . Number of children: 1  . Years of education: Not on file  . Highest education level: 11th grade  Occupational History    Comment: Retired   Scientific laboratory technician  . Financial resource strain: Not on file  . Food insecurity:    Worry: Not on file    Inability: Not on file  . Transportation needs:    Medical: Not on file    Non-medical: Not on file  Tobacco Use  . Smoking status: Former Smoker    Packs/day: 0.30    Years: 15.00    Pack years: 4.50    Types: Cigarettes    Last attempt to quit: 08/31/1996    Years since quitting: 22.4  . Smokeless tobacco: Never Used  Substance and Sexual Activity  . Alcohol use: No    Alcohol/week: 0.0 standard drinks  . Drug use: No  . Sexual activity: Not Currently  Lifestyle  . Physical activity:    Days per week: Not on file    Minutes per session: Not on file  . Stress: Not on file  Relationships  . Social connections:    Talks on phone: Not on file    Gets  together: Not on file    Attends religious service: Not on file    Active member of club or organization: Not on file    Attends meetings of clubs or organizations: Not  on file    Relationship status: Not on file  Other Topics Concern  . Not on file  Social History Narrative   Lives with husband, has 1 daughter. No pets.   Work - retired Emergency planning/management officer   Diet - healthy   Right handed    Caffeine- 1 cup per day.    Outpatient Encounter Medications as of 01/31/2019  Medication Sig  . aspirin 81 MG tablet Take 1 tablet (81 mg total) by mouth daily.  . carvedilol (COREG) 3.125 MG tablet TAKE 1 TABLET (3.125 MG TOTAL) BY MOUTH 2 (TWO) TIMES DAILY.  Marland Kitchen Coenzyme Q10 (CO Q10) 100 MG CAPS Take 100 mg by mouth daily.  Marland Kitchen ezetimibe (ZETIA) 10 MG tablet TAKE ONE TABLET BY MOUTH EVERY DAY  . Fluocinolone Acetonide Scalp (DERMA-SMOOTHE/FS SCALP) 0.01 % OIL Apply 1 application topically daily.  Marland Kitchen gabapentin (NEURONTIN) 300 MG capsule Take 1 capsule (300 mg total) by mouth 2 (two) times daily as needed.  . hydrochlorothiazide (HYDRODIURIL) 12.5 MG tablet TAKE 1 TABLET BY MOUTH EVERY DAY  . lisinopril (ZESTRIL) 2.5 MG tablet Take 1 tablet (2.5 mg total) by mouth daily.  Marland Kitchen omeprazole (PRILOSEC) 20 MG capsule Take 1 capsule (20 mg total) by mouth daily.  . rosuvastatin (CRESTOR) 20 MG tablet TAKE 1 TABLET BY MOUTH EVERY EVENING  . vitamin B-12 (CYANOCOBALAMIN) 1000 MCG tablet Take 1 tablet (1,000 mcg total) by mouth daily.  . [DISCONTINUED] diazepam (VALIUM) 2 MG tablet Take 2 mg by mouth 2 (two) times a day.  . [DISCONTINUED] MAGNESIUM PO Take by mouth.  . [DISCONTINUED] predniSONE (DELTASONE) 10 MG tablet Take 2 a day for 5 days, then 1 a day for 5 days, with food. Don't take with aleve/ibuprofen.   No facility-administered encounter medications on file as of 01/31/2019.     Activities of Daily Living In your present state of health, do you have any difficulty performing the following activities: 01/31/2019   Hearing? Y  Vision? N  Difficulty concentrating or making decisions? N  Walking or climbing stairs? N  Dressing or bathing? N  Doing errands, shopping? N  Preparing Food and eating ? N  Using the Toilet? N  In the past six months, have you accidently leaked urine? N  Do you have problems with loss of bowel control? N  Managing your Medications? N  Managing your Finances? N  Housekeeping or managing your Housekeeping? N  Some recent data might be hidden    Patient Care Team: Tonia Ghent, MD as PCP - General (Family Medicine) Minna Merritts, MD (Cardiology) Earnie Larsson, MD as Consulting Physician (Neurosurgery)    Assessment:   This is a routine wellness examination for [place].  Vision Screening Comments: Vision exam in 2019; future appt in July 2020 with Dr. Ellie Lunch  Exercise Activities and Dietary recommendations Current Exercise Habits: The patient does not participate in regular exercise at present, Exercise limited by: orthopedic condition(s)  Goals    . Patient Stated     Starting 01/31/2019, I will continue to take medication as prescribed.        Fall Risk Fall Risk  01/31/2019 01/11/2018 01/07/2017  Falls in the past year? 0 No No   Depression Screen PHQ 2/9 Scores 01/31/2019 01/11/2018 01/07/2017  PHQ - 2 Score 3 0 6  PHQ- 9 Score 8 0 15     Cognitive Function MMSE - Mini Mental State Exam 01/31/2019 01/11/2018 01/07/2017  Orientation to time 5 5 5   Orientation  to Place 5 5 5   Registration 3 3 3   Attention/ Calculation 0 0 0  Recall 3 3 3   Language- name 2 objects 0 0 0  Language- repeat 1 1 1   Language- follow 3 step command 0 3 3  Language- read & follow direction 0 0 0  Write a sentence 0 0 0  Copy design 0 0 0  Total score 17 20 20      PLEASE NOTE: A Mini-Cog screen was completed. Maximum score is 17. A value of 0 denotes this part of [place] MMSE was not completed or the patient failed this part of the Mini-Cog screening.   Mini-Cog Screening  Orientation to Time - Max 5 pts Orientation to Place - Max 5 pts Registration - Max 3 pts Recall - Max 3 pts Language Repeat - Max 1 pts      Immunization History  Administered Date(s) Administered  . Influenza Split 05/27/2012  . Influenza Whole 06/20/2013  . Influenza, High Dose Seasonal PF 05/27/2018  . Influenza, Seasonal, Injecte, Preservative Fre 06/24/2016  . Influenza,inj,Quad PF,6+ Mos 06/14/2014, 06/07/2015, 05/24/2017  . Pneumococcal Conjugate-13 01/07/2017  . Pneumococcal Polysaccharide-23 03/11/2011  . Tdap 05/27/2012  . Zoster 03/11/2011    Screening Tests Health Maintenance  Topic Date Due  . OPHTHALMOLOGY EXAM  03/12/1951  . MAMMOGRAM  01/07/2026 (Originally 03/10/2012)  . DEXA SCAN  01/07/2026 (Originally 03/12/2005)  . INFLUENZA VACCINE  04/01/2019  . HEMOGLOBIN A1C  05/06/2019  . FOOT EXAM  07/27/2019  . TETANUS/TDAP  05/27/2022  . PNA vac Low Risk Adult  Completed      Plan:     I have personally reviewed, addressed, and noted the following in the patient's chart:  A. Medical and social history B. Use of alcohol, tobacco or illicit drugs  C. Current medications and supplements D. Functional ability and status E.  Nutritional status F.  Physical activity G. Advance directives H. List of other physicians I.  Hospitalizations, surgeries, and ER visits in previous 12 months J.  Vitals (unless it is a telemedicine encounter) K. Screenings to include cognitive, depression, hearing, vision (NOTE: hearing and vision screenings not completed in telemedicine encounter) L. Referrals and appointments   In addition, I have reviewed and discussed with patient certain preventive protocols, quality metrics, and best practice recommendations. A written personalized care plan for preventive services and recommendations were provided to patient.  With patient's permission, we connected on 01/31/19 at  8:00 AM EDT. Interactive audio and video telecommunications  were attempted with patient. This attempt was unsuccessful due to patient having technical difficulties OR patient did not have access to video capability.  Encounter was completed with audio only.  Two patient identifiers were used to ensure the encounter occurred with the correct person. Patient was in home and writer was in office.   Signed,   Lindell Noe, MHA, BS, LPN Health Coach

## 2019-01-31 NOTE — Telephone Encounter (Signed)
Patient called back and stated that she had a few Tramadol left over and she has been taking them and it has helped her to be able to sleep at night. Patient stated that right now it is not causing her to be sedated when she wakes up int he morning.  Patient stated that she is taking Gabapentin 300 mg three times a day and she is able to tolerate that dose. Patient stated that the Gabapentin wears off between the times to take it. Patient stated that her back is out again and she has left a message for the doctor that did the shots in her back to call her back,.

## 2019-01-31 NOTE — Patient Instructions (Signed)
Catherine Green , Thank you for taking time to come for your Medicare Wellness Visit. I appreciate your ongoing commitment to your health goals. Please review the following plan we discussed and let me know if I can assist you in the future.   These are the goals we discussed: Goals    . Patient Stated     Starting 01/31/2019, I will continue to take medication as prescribed.        This is a list of the screening recommended for you and due dates:  Health Maintenance  Topic Date Due  . Eye exam for diabetics  03/12/1951  . Mammogram  01/07/2026*  . DEXA scan (bone density measurement)  01/07/2026*  . Flu Shot  04/01/2019  . Hemoglobin A1C  05/06/2019  . Complete foot exam   07/27/2019  . Tetanus Vaccine  05/27/2022  . Pneumonia vaccines  Completed  *Topic was postponed. The date shown is not the original due date.   Preventive Care for Adults  A healthy lifestyle and preventive care can promote health and wellness. Preventive health guidelines for adults include the following key practices.  . A routine yearly physical is a good way to check with your health care provider about your health and preventive screening. It is a chance to share any concerns and updates on your health and to receive a thorough exam.  . Visit your dentist for a routine exam and preventive care every 6 months. Brush your teeth twice a day and floss once a day. Good oral hygiene prevents tooth decay and gum disease.  . The frequency of eye exams is based on your age, health, family medical history, use  of contact lenses, and other factors. Follow your health care provider's recommendations for frequency of eye exams.  . Eat a healthy diet. Foods like vegetables, fruits, whole grains, low-fat dairy products, and lean protein foods contain the nutrients you need without too many calories. Decrease your intake of foods high in solid fats, added sugars, and salt. Eat the right amount of calories for you. Get  information about a proper diet from your health care provider, if necessary.  . Regular physical exercise is one of the most important things you can do for your health. Most adults should get at least 150 minutes of moderate-intensity exercise (any activity that increases your heart rate and causes you to sweat) each week. In addition, most adults need muscle-strengthening exercises on 2 or more days a week.  Silver Sneakers may be a benefit available to you. To determine eligibility, you may visit the website: www.silversneakers.com or contact program at 8653781435 Mon-Fri between 8AM-8PM.   . Maintain a healthy weight. The body mass index (BMI) is a screening tool to identify possible weight problems. It provides an estimate of body fat based on height and weight. Your health care provider can find your BMI and can help you achieve or maintain a healthy weight.   For adults 20 years and older: ? A BMI below 18.5 is considered underweight. ? A BMI of 18.5 to 24.9 is normal. ? A BMI of 25 to 29.9 is considered overweight. ? A BMI of 30 and above is considered obese.   . Maintain normal blood lipids and cholesterol levels by exercising and minimizing your intake of saturated fat. Eat a balanced diet with plenty of fruit and vegetables. Blood tests for lipids and cholesterol should begin at age 73 and be repeated every 5 years. If your lipid or cholesterol  levels are high, you are over 50, or you are at high risk for heart disease, you may need your cholesterol levels checked more frequently. Ongoing high lipid and cholesterol levels should be treated with medicines if diet and exercise are not working.  . If you smoke, find out from your health care provider how to quit. If you do not use tobacco, please do not start.  . If you choose to drink alcohol, please do not consume more than 2 drinks per day. One drink is considered to be 12 ounces (355 mL) of beer, 5 ounces (148 mL) of wine, or 1.5  ounces (44 mL) of liquor.  . If you are 62-50 years old, ask your health care provider if you should take aspirin to prevent strokes.  . Use sunscreen. Apply sunscreen liberally and repeatedly throughout the day. You should seek shade when your shadow is shorter than you. Protect yourself by wearing long sleeves, pants, a wide-brimmed hat, and sunglasses year round, whenever you are outdoors.  . Once a month, do a whole body skin exam, using a mirror to look at the skin on your back. Tell your health care provider of new moles, moles that have irregular borders, moles that are larger than a pencil eraser, or moles that have changed in shape or color.

## 2019-01-31 NOTE — Telephone Encounter (Signed)
During AWV, patient requested refill for Tramadol due to lower back and bilateral lower extremity pain. Pain scale: 7/10. Per pt report, pain is pressure that starts in lower back and radiates to bilateral lower extremities.   Last prescribed in 2016 by another PCP. Pharmacy of choice is [facility]. CPE appt 02/03/19.

## 2019-01-31 NOTE — Telephone Encounter (Signed)
Patient notified as instructed by telephone and verbalized understanding. 

## 2019-02-01 ENCOUNTER — Other Ambulatory Visit: Payer: Self-pay | Admitting: Family Medicine

## 2019-02-01 ENCOUNTER — Encounter: Payer: Self-pay | Admitting: Family Medicine

## 2019-02-01 ENCOUNTER — Other Ambulatory Visit (INDEPENDENT_AMBULATORY_CARE_PROVIDER_SITE_OTHER): Payer: Medicare HMO

## 2019-02-01 DIAGNOSIS — E119 Type 2 diabetes mellitus without complications: Secondary | ICD-10-CM

## 2019-02-01 LAB — CBC WITH DIFFERENTIAL/PLATELET
Basophils Absolute: 0 10*3/uL (ref 0.0–0.1)
Basophils Relative: 0.3 % (ref 0.0–3.0)
Eosinophils Absolute: 0.1 10*3/uL (ref 0.0–0.7)
Eosinophils Relative: 0.9 % (ref 0.0–5.0)
HCT: 34.9 % — ABNORMAL LOW (ref 36.0–46.0)
Hemoglobin: 11.6 g/dL — ABNORMAL LOW (ref 12.0–15.0)
Lymphocytes Relative: 28.4 % (ref 12.0–46.0)
Lymphs Abs: 2 10*3/uL (ref 0.7–4.0)
MCHC: 33.2 g/dL (ref 30.0–36.0)
MCV: 81.5 fl (ref 78.0–100.0)
Monocytes Absolute: 0.6 10*3/uL (ref 0.1–1.0)
Monocytes Relative: 7.9 % (ref 3.0–12.0)
Neutro Abs: 4.5 10*3/uL (ref 1.4–7.7)
Neutrophils Relative %: 62.5 % (ref 43.0–77.0)
Platelets: 261 10*3/uL (ref 150.0–400.0)
RBC: 4.28 Mil/uL (ref 3.87–5.11)
RDW: 15.9 % — ABNORMAL HIGH (ref 11.5–15.5)
WBC: 7.1 10*3/uL (ref 4.0–10.5)

## 2019-02-01 LAB — HEMOGLOBIN A1C: Hgb A1c MFr Bld: 7.1 % — ABNORMAL HIGH (ref 4.6–6.5)

## 2019-02-01 LAB — COMPREHENSIVE METABOLIC PANEL
ALT: 14 U/L (ref 0–35)
AST: 14 U/L (ref 0–37)
Albumin: 3.6 g/dL (ref 3.5–5.2)
Alkaline Phosphatase: 62 U/L (ref 39–117)
BUN: 14 mg/dL (ref 6–23)
CO2: 34 mEq/L — ABNORMAL HIGH (ref 19–32)
Calcium: 9 mg/dL (ref 8.4–10.5)
Chloride: 96 mEq/L (ref 96–112)
Creatinine, Ser: 0.89 mg/dL (ref 0.40–1.20)
GFR: 61.2 mL/min (ref >60.00)
Glucose, Bld: 100 mg/dL — ABNORMAL HIGH (ref 70–99)
Potassium: 3.6 mEq/L (ref 3.5–5.1)
Sodium: 136 mEq/L (ref 135–145)
Total Bilirubin: 0.6 mg/dL (ref 0.2–1.2)
Total Protein: 5.5 g/dL — ABNORMAL LOW (ref 6.0–8.3)

## 2019-02-01 LAB — LIPID PANEL
Cholesterol: 125 mg/dL (ref 0–200)
HDL: 58.4 mg/dL (ref >39.00)
LDL Cholesterol: 45 mg/dL (ref 0–99)
NonHDL: 66.78
Total CHOL/HDL Ratio: 2
Triglycerides: 109 mg/dL (ref 0.0–149.0)
VLDL: 21.8 mg/dL (ref 0.0–40.0)

## 2019-02-01 NOTE — Telephone Encounter (Signed)
Note: patient sends a Pharmacist, community message today stating that she never asked for tramadol and wanted trazodone instead.  I reviewed the entire message string in this encounter with patient.    Discussed this extensively with patient as there is a lot of confusion around the below phone message requests from yesterday.  Patient states that she apologizes for any miscommunication but is convinced she was asking for trazodone r/x to help her sleep, she does not want to take the Tramadol.  She was not asking for something for pain as she is taking the Gabapentin and has an appointment scheduled for injections with her doctor that manages that.   I reviewed with her that we actually have trazodone listed as allergy for her.  She says its not an allergy, it just was too sedating for her when she took it last.  But at that time she was on additional meds for anxiety following the death of her husband and she thinks the combination was too much.   She would like to try the trazodone again and she is going to dispose of the tramadol. I gave her information for drug collecting site at the local [place] office as I do not believe the pharmacy will take it back.  But made it very clear to her that despite repeated discussions with different nurses yesterday all seeking clarification on medication, she maintained asking for tramadol.  Patient states we misunderstood her request, it should have been trazodone.   Dr. Damita Dunnings, please advise on sending in a new R/X for trazodone.  This is documented in the my chart message as well.

## 2019-02-01 NOTE — Telephone Encounter (Signed)
Discussed this extensively with patient as there is a lot of confusion around her phone message requests from yesterday (refer to phone encounter).  Patient states that she apologizes for any miscommunication but is convinced she was asking for trazodone r/x to help her sleep, she does not want to take the Tramadol.  She was not asking for something for pain as she is taking the Gabapentin and has an appointment scheduled for injections with her doctor that manages that.   I reviewed with her that we actually have trazodone listed as allergy for her.  She says its not an allergy, it just was too sedating for her when she took it last.  But at that time she was on additional meds for anxiety following the death of her husband and she thinks the combination was too much.   She would like to try the trazodone again and she is going to dispose of the tramadol. I gave her information for drug collecting site at the local [place] office as I do not believe the pharmacy will take it back.   Dr. Damita Dunnings, please advise on sending in a new R/X for trazodone.  I will also copy this in to the last phone encounter in case need to order new medication.

## 2019-02-02 MED ORDER — TRAZODONE HCL 50 MG PO TABS: 25.0000 mg | ORAL_TABLET | Freq: Every evening | ORAL | 0 refills | Status: DC | PRN

## 2019-02-02 NOTE — Telephone Encounter (Signed)
Patient advised.

## 2019-02-02 NOTE — Telephone Encounter (Addendum)
She can try trazodone, with sedation caution.  Tramadol removed from med list.  App help of all involved.  rx sent.

## 2019-02-02 NOTE — Addendum Note (Signed)
Addended by: Tonia Ghent on: 02/02/2019 12:19 AM   Modules accepted: Orders

## 2019-02-03 ENCOUNTER — Encounter: Payer: Self-pay | Admitting: Family Medicine

## 2019-02-03 ENCOUNTER — Ambulatory Visit (INDEPENDENT_AMBULATORY_CARE_PROVIDER_SITE_OTHER): Payer: Medicare HMO | Admitting: Family Medicine

## 2019-02-03 VITALS — Wt 154.0 lb

## 2019-02-03 DIAGNOSIS — Z7189 Other specified counseling: Secondary | ICD-10-CM

## 2019-02-03 DIAGNOSIS — G4700 Insomnia, unspecified: Secondary | ICD-10-CM

## 2019-02-03 DIAGNOSIS — E782 Mixed hyperlipidemia: Secondary | ICD-10-CM

## 2019-02-03 DIAGNOSIS — I10 Essential (primary) hypertension: Secondary | ICD-10-CM

## 2019-02-03 DIAGNOSIS — Z0000 Encounter for general adult medical examination without abnormal findings: Secondary | ICD-10-CM

## 2019-02-03 DIAGNOSIS — M48061 Spinal stenosis, lumbar region without neurogenic claudication: Secondary | ICD-10-CM | POA: Diagnosis not present

## 2019-02-03 DIAGNOSIS — R69 Illness, unspecified: Secondary | ICD-10-CM | POA: Diagnosis not present

## 2019-02-03 DIAGNOSIS — D649 Anemia, unspecified: Secondary | ICD-10-CM | POA: Diagnosis not present

## 2019-02-03 DIAGNOSIS — F4321 Adjustment disorder with depressed mood: Secondary | ICD-10-CM

## 2019-02-03 MED ORDER — GABAPENTIN 300 MG PO CAPS: 300.0000 mg | ORAL_CAPSULE | Freq: Three times a day (TID) | ORAL | 3 refills | Status: DC

## 2019-02-03 MED ORDER — GABAPENTIN 300 MG PO CAPS: 300.0000 mg | ORAL_CAPSULE | Freq: Three times a day (TID) | ORAL | Status: DC

## 2019-02-03 NOTE — Progress Notes (Signed)
Interactive audio and video telecommunications were attempted between this provider and patient, however failed, due to patient having technical difficulties OR patient did not have access to video capability.  We continued and completed visit with audio only.   Virtual Visit via Telephone Note  I connected with patient on 02/03/19 at 9:12 AM by telephone and verified that I am speaking with the correct person using two identifiers.  Location of patient: home.   Location of MD: [facility] Name of referring provider (if blank then none associated): Names per persons and role in encounter:  MD: Earlyne Iba, Patient: name listed above.    I discussed the limitations, risks, security and privacy concerns of performing an evaluation and management service by telephone and the availability of in person appointments. I also discussed with the patient that there may be a patient responsible charge related to this service. The patient expressed understanding and agreed to proceed.  Cc f/u  History of Present Illness:   Hypertension:  Using medication without problems or lightheadedness: yes Chest pain with exertion:no Edema: minimal BLE edema Short of breath:no She has f/u pending with cardiology.  D/w pt about adequate fluid intake.    HGB similar to prev with borderline anemia for years.  D/w pt.  No black or blood stools.  No known blood loss.    Elevated Cholesterol: Using medications without problems:yes Muscle aches:  Likely not from statin Diet compliance: yes Exercise: limited due to pain.   Diabetes:  No meds.  Hypoglycemic episodes: no sx Hyperglycemic episodes: no sx Feet problems: no Blood Sugars averaging: not checked often. Had been ~110. Labs d/w pt.  eye exam within last year:  Yes and has f/u pending.   zostavax 2012.  shingrix out of stock.   PNA UTP Flu 2019 Tdap 2013 Declined mammogram. She wouldn't want intervention if abnormal at this point.  DXA  declined.   Husband is designated if patient were incapacitated.   Colonoscopy 2013  Mood d/w pt.  Given her family upheaval and pain level, her mood is lower.  She has no SI/HI.  She is going to talk with her pastor and update me as needed.    Back pain.  She has f/u pending with the back clinic.  No ADE on med.  Gabapentin is helping but needed frequently, see orders.  She is using walker.  No weakness but pain with ROM with BLE.    Insomnia. Trazodone filled and she tried that.  It helped some.  No ADE on med. 25mg  helped.    PMH and SH reviewed  ROS: Per HPI unless specifically indicated in ROS section   Meds and allergies reviewed.      Observations/Objective: Vitals per patient report.  nad Speech wnl Weight 154.4 lbs.   Temp 97.4.   BP 114/43- she has BP elevations later in the day, up to ~140/70s Pulse 59  Assessment and Plan:  Hypertension:  No change in meds.  She has f/u pending with cardiology.  D/w pt about adequate fluid intake.    HGB similar to prev with borderline anemia for years.  D/w pt.  No black or blood stools.  No known blood loss.  No change in meds or eval for now.  She agrees.   Elevated Cholesterol: Continue as is.  She agrees.    Diabetes:  No meds.  Needs recheck A1c in 3 months at/before visit.  No change in meds.   Health maintenance.  zostavax 2012.  shingrix out of stock.   PNA UTP Flu 2019 Tdap 2013 Declined mammogram. She wouldn't want intervention if abnormal at this point.  DXA declined.   Husband is designated if patient were incapacitated.   Colonoscopy 2013  Mood d/w pt.  Given her family upheaval and pain level, her mood is lower.  She has no SI/HI.  She is going to talk with her pastor and update me as needed.    Back pain.  She has f/u pending with the back clinic.  No ADE on med.  Gabapentin is helping but needed frequently, see orders.  She is using walker.  No weakness but pain with ROM with BLE.    Insomnia.  Trazodone filled and she tried that.  It helped some.  No ADE on med. 25mg  helped.    Follow Up Instructions: see above.    I discussed the assessment and treatment plan with the patient. The patient was provided an opportunity to ask questions and all were answered. The patient agreed with the plan and demonstrated an understanding of the instructions.   The patient was advised to call back or seek an in-person evaluation if the symptoms worsen or if the condition fails to improve as anticipated.  I provided 25 minutes of non-face-to-face time during this encounter.  Elsie Stain, MD

## 2019-02-05 DIAGNOSIS — D649 Anemia, unspecified: Secondary | ICD-10-CM | POA: Insufficient documentation

## 2019-02-05 NOTE — Assessment & Plan Note (Signed)
She has f/u pending with the back clinic.  No ADE on med.  Gabapentin is helping but needed frequently, see orders.  She is using walker.  No weakness but pain with ROM with BLE.

## 2019-02-05 NOTE — Assessment & Plan Note (Signed)
zostavax 2012.  shingrix out of stock.   PNA UTP Flu 2019 Tdap 2013 Declined mammogram. She wouldn't want intervention if abnormal at this point.  DXA declined.   Husband is designated if patient were incapacitated.    Colonoscopy 2013

## 2019-02-05 NOTE — Assessment & Plan Note (Signed)
Trazodone filled and she tried that.  It helped some.  No ADE on med. 25mg  helped.

## 2019-02-05 NOTE — Assessment & Plan Note (Signed)
Mood d/w pt.  Given her family upheaval and pain level, her mood is lower.  She has no SI/HI.  She is going to talk with her pastor and update me as needed.

## 2019-02-05 NOTE — Assessment & Plan Note (Signed)
HGB similar to prev with borderline anemia for years.  D/w pt.  No black or blood stools.  No known blood loss.  No change in meds or eval for now.  She agrees.

## 2019-02-05 NOTE — Assessment & Plan Note (Signed)
Continue as is.  She agrees.

## 2019-02-05 NOTE — Assessment & Plan Note (Signed)
No change in meds.  She has f/u pending with cardiology.  D/w pt about adequate fluid intake.

## 2019-02-05 NOTE — Assessment & Plan Note (Signed)
Husband is designated if patient were incapacitated.

## 2019-03-09 DIAGNOSIS — M545 Low back pain: Secondary | ICD-10-CM | POA: Diagnosis not present

## 2019-03-10 DIAGNOSIS — L728 Other follicular cysts of the skin and subcutaneous tissue: Secondary | ICD-10-CM | POA: Diagnosis not present

## 2019-03-10 DIAGNOSIS — L218 Other seborrheic dermatitis: Secondary | ICD-10-CM | POA: Diagnosis not present

## 2019-03-21 DIAGNOSIS — Z961 Presence of intraocular lens: Secondary | ICD-10-CM | POA: Diagnosis not present

## 2019-04-05 ENCOUNTER — Other Ambulatory Visit: Payer: Self-pay | Admitting: Family Medicine

## 2019-04-05 NOTE — Telephone Encounter (Signed)
Sent. Thanks.  Okay to continue given those details.

## 2019-04-05 NOTE — Telephone Encounter (Addendum)
Received refill request electronically for Diazepam Medication is no longer on medication list  Called and spoke to patient and was advised that she is taking Diazepam. Patient stated that she stopped taking the Trazodone because it made her real sleepy.

## 2019-04-06 DIAGNOSIS — Z6829 Body mass index (BMI) 29.0-29.9, adult: Secondary | ICD-10-CM | POA: Diagnosis not present

## 2019-04-06 DIAGNOSIS — I10 Essential (primary) hypertension: Secondary | ICD-10-CM | POA: Diagnosis not present

## 2019-04-06 DIAGNOSIS — M961 Postlaminectomy syndrome, not elsewhere classified: Secondary | ICD-10-CM | POA: Diagnosis not present

## 2019-04-06 DIAGNOSIS — M48061 Spinal stenosis, lumbar region without neurogenic claudication: Secondary | ICD-10-CM | POA: Diagnosis not present

## 2019-04-11 ENCOUNTER — Other Ambulatory Visit: Payer: Self-pay | Admitting: Family Medicine

## 2019-04-13 ENCOUNTER — Other Ambulatory Visit: Payer: Self-pay | Admitting: Neurosurgery

## 2019-04-13 DIAGNOSIS — M961 Postlaminectomy syndrome, not elsewhere classified: Secondary | ICD-10-CM

## 2019-04-19 NOTE — Progress Notes (Signed)
Cardiology Office Note  Date:  04/20/2019   ID:  Green, Catherine June 16, 1941, MRN [id number]  PCP:  Tonia Ghent, MD   No chief complaint on file. cc: leg weakness, back pain  HPI:  [name] is a very pleasant 78 year-old woman with history  coronary artery disease,  bypass surgery,  peripheral vascular disease   right CEA, left CEA followed by [name], 01/2018: left ICA are consistent with a 40-59% stenosis. catheterization January 2009 showing patent vein grafts, atretic LIMA to the LAD. Hx of anxiety Back surgery in May 2017,  who presents for routine followup of her coronary artery disease.   Worsening leg swelling Leg strength worse with activity  Tolerating Crestor and Zetia But muscle cramps  193-790 systolic Less dizzy, rare  Still with GI issues, better on PPI  Anxiety better, on valium as needed  EKG personally reviewed by myself on todays visit shows normal sinus rhythm with rate 59 bpm, no significant ST or T wave changes  Other past medical history reviewed Chronic issue "all day" with nausea/diarrhea,  After eating, anorexia, abdominal pain   Back surgery in May 2017, No regular exercise program  left ICA are consistent with a 40-59% stenosis. <39% on the right Followed in [place]  past medical history reviewed Previous MRI of her back reviewed with her showing DJD, foraminal narrowing Seen by [name].   Previously went on a cruise and developed pneumonia, required ABX Reports having labile blood pressure.   Previous symptoms of shortness of breath and weight gain. Improved with Lasix to take as needed.  She had her gallbladder taken out 03/13/2014.  she had "hair loss" on simvastatin, as well as sores on her head. Lipitor caused leg problems/myalgias.     PMH:   has a past medical history of Allergy, Anemia, Anxiety, Back pain, Carotid artery occlusion, Cerebrovascular disease, Chicken pox, Coronary artery disease,  Depression, Dizziness, Dizziness, DVT (deep venous thrombosis) (HCC), Fainting spell, GERD (gastroesophageal reflux disease), Headache, Heart murmur, Hyperlipidemia, Hypertension, Leg pain, PONV (postoperative nausea and vomiting), Pre-syncope, PVD (peripheral vascular disease) ([place]), Seasonal allergies, Shortness of breath dyspnea, Swelling of both ankles, Thrombophlebitis, and Ulcer.  PSH:    Past Surgical History:  Procedure Laterality Date  . APPENDECTOMY    . BACK SURGERY  01/14/16  . BREAST SURGERY     saline implants  . CARDIAC CATHETERIZATION    . CAROTID ENDARTERECTOMY  1992  . CAROTID ENDARTERECTOMY Right February 13, 2005   Re-do Right CE  . CATARACT EXTRACTION, BILATERAL  2013  . CHOLECYSTECTOMY    . CHOLECYSTECTOMY, LAPAROSCOPIC  04/02/14   Dr. Rochel Brome  . CORONARY ARTERY BYPASS GRAFT  2006   x3 Dr. Prescott Gum  . ENDARTERECTOMY Left 07/20/2014   Procedure: ENDARTERECTOMY CAROTID-LEFT;  Surgeon: Rosetta Posner, MD;  Location: [facility];  Service: Vascular;  Laterality: Left;  . EYE SURGERY Bilateral Feb. 2013   Cataract Left eye  . SEPTOPLASTY     with bilateral inferior turbinate reductions  . SPINE SURGERY  12/2015  . TOTAL ABDOMINAL HYSTERECTOMY    . UPPER GASTROINTESTINAL ENDOSCOPY     had polyps removed from esophagus  . UPPER GI ENDOSCOPY      Current Outpatient Medications  Medication Sig Dispense Refill  . aspirin 81 MG tablet Take 1 tablet (81 mg total) by mouth daily. 30 tablet   . carvedilol (COREG) 3.125 MG tablet TAKE 1 TABLET (3.125 MG TOTAL) BY MOUTH  2 (TWO) TIMES DAILY. 180 tablet 2  . Coenzyme Q10 (CO Q10) 100 MG CAPS Take 100 mg by mouth daily.    . diazepam (VALIUM) 2 MG tablet TAKE ONE-HALF TO ONE TABLET BY MOUTH AS NEEDED FOR ANXIETY (SEDATION CAUTION) 20 tablet 1  . ezetimibe (ZETIA) 10 MG tablet TAKE ONE TABLET BY MOUTH EVERY DAY 90 tablet 3  . Fluocinolone Acetonide Scalp (DERMA-SMOOTHE/FS SCALP) 0.01 % OIL Apply 1 application topically daily. 118.28  mL 0  . gabapentin (NEURONTIN) 300 MG capsule Take 1-2 capsules (300-600 mg total) by mouth 3 (three) times daily. 100 capsule 3  . hydrochlorothiazide (HYDRODIURIL) 12.5 MG tablet TAKE 1 TABLET BY MOUTH EVERY DAY 90 tablet 2  . ketoconazole (NIZORAL) 2 % shampoo     . lisinopril (ZESTRIL) 2.5 MG tablet Take 1 tablet (2.5 mg total) by mouth daily.    Marland Kitchen omeprazole (PRILOSEC) 20 MG capsule Take 1 capsule (20 mg total) by mouth daily. 30 capsule 3  . rosuvastatin (CRESTOR) 20 MG tablet TAKE 1 TABLET BY MOUTH EVERY EVENING 90 tablet 3  . vitamin B-12 (CYANOCOBALAMIN) 1000 MCG tablet Take 1 tablet (1,000 mcg total) by mouth daily.     No current facility-administered medications for this visit.      Allergies:   Lipitor [atorvastatin], Oxycodone hcl, Protonix [pantoprazole sodium], Cymbalta [duloxetine hcl], Doxycycline, Effexor [venlafaxine], Hydrochlorothiazide, Hydrocodone, Hydroxyzine, Lyrica [pregabalin], Metoprolol, Penicillin g, Trazodone and nefazodone, Zoloft [sertraline], Augmentin [amoxicillin-pot clavulanate], Codeine, Demerol, Ivp dye [iodinated diagnostic agents], and Shellfish allergy   Social History:  The patient  reports that she quit smoking about 22 years ago. Her smoking use included cigarettes. She has a 4.50 pack-year smoking history. She has never used smokeless tobacco. She reports that she does not drink alcohol or use drugs.   Family History:   family history includes Aneurysm in her brother and mother; Birth defects in her brother; Bleeding Disorder in her mother and sister; Cancer in her sister; Cirrhosis in her father; Deep vein thrombosis in her brother; Heart attack in her father; Heart disease in her brother, father, mother, and sister; Hyperlipidemia in her brother, daughter, father, mother, and sister; Hypertension in her brother, daughter, father, mother, sister, and another family member; Peripheral vascular disease in her daughter; Varicose Veins in her mother and  sister.    Review of Systems: Review of Systems  Constitutional: Negative.   Respiratory: Negative.   Cardiovascular: Negative.   Gastrointestinal: Negative.   Musculoskeletal: Positive for back pain.  Neurological: Negative.   Psychiatric/Behavioral: The patient is nervous/anxious and has insomnia.   All other systems reviewed and are negative.    PHYSICAL EXAM: VS:  BP (!) 148/64   Pulse (!) 59   Ht 5\' 2"  (1.575 m)   Wt 161 lb (73 kg)   SpO2 98%   BMI 29.45 kg/m  , BMI Body mass index is 29.45 kg/m. Constitutional:  oriented to person, place, and time. No distress. weak, difficulty ambulating HENT:  Head: Normocephalic and atraumatic.  Eyes:  no discharge. No scleral icterus.  Neck: Normal range of motion. Neck supple. No JVD present.  2+ carotid bruit on the right Cardiovascular: Normal rate, regular rhythm, normal heart sounds and intact distal pulses. Exam reveals no gallop and no friction rub. No edema 2/6 systolic ejection murmur heard left sternal border Pulmonary/Chest: Effort normal and breath sounds normal. No stridor. No respiratory distress.  no wheezes.  no rales.  no tenderness.  Abdominal: Soft.  no distension.  no tenderness.  Musculoskeletal: Normal range of motion.  no  tenderness or deformity.  Neurological:  normal muscle tone. Coordination normal. No atrophy Skin: Skin is warm and dry. No rash noted. not diaphoretic.  Psychiatric:  normal mood and affect. behavior is normal. Thought content normal.    Recent Labs: 04/22/2018: TSH 1.06 02/01/2019: ALT 14; BUN 14; Creatinine, Ser 0.89; Hemoglobin 11.6; Platelets 261.0; Potassium 3.6; Sodium 136    Lipid Panel Lab Results  Component Value Date   CHOL 125 02/01/2019   HDL 58.40 02/01/2019   LDLCALC 45 02/01/2019   TRIG 109.0 02/01/2019      Wt Readings from Last 3 Encounters:  04/20/19 161 lb (73 kg)  02/03/19 154 lb (69.9 kg)  01/17/19 159 lb (72.1 kg)       ASSESSMENT AND  PLAN:  Adjustment disorder Previous discussion with her Loss of several family members, insomnia, On valium, doing better  Carotid stenosis, bilateral -  Stable disease bilaterally , 40-59% disease on the right on recent ultrasound,  Needs repeat every year or every other year  Essential hypertension -  Blood pressure is well controlled on today's visit. No changes made to the medications.  Hyperlipidemia, unspecified hyperlipidemia type -  On Crestor and Zetia Cramps, will hold crestor, Might need PCSK9 inh  Atherosclerosis of coronary artery bypass graft of native heart without angina pectoris Currently with no anginal symptoms. No further testing at this time.  Stable. Might need repatha as above  Abdominal pain Previous mesenteric ultrasound, flow stable  Leg swelling Most likely venous insufficiency No swelling today  Anxiety/neuropathic pain stable    Total encounter time more than 25 minutes  Greater than 50% was spent in counseling and coordination of care with the patient   Disposition:   F/U  12 months   Orders Placed This Encounter  Procedures  . EKG 12-Lead     Signed, Esmond Plants, M.D., Ph.D. 04/20/2019  [place], [place]

## 2019-04-20 ENCOUNTER — Ambulatory Visit: Payer: Medicare HMO | Admitting: Cardiovascular Disease

## 2019-04-20 ENCOUNTER — Other Ambulatory Visit: Payer: Self-pay

## 2019-04-20 ENCOUNTER — Encounter: Payer: Self-pay | Admitting: Cardiovascular Disease

## 2019-04-20 VITALS — BP 148/64 | HR 59 | Ht 62.0 in | Wt 161.0 lb

## 2019-04-20 DIAGNOSIS — I6523 Occlusion and stenosis of bilateral carotid arteries: Secondary | ICD-10-CM | POA: Diagnosis not present

## 2019-04-20 DIAGNOSIS — I25118 Atherosclerotic heart disease of native coronary artery with other forms of angina pectoris: Secondary | ICD-10-CM | POA: Diagnosis not present

## 2019-04-20 DIAGNOSIS — I739 Peripheral vascular disease, unspecified: Secondary | ICD-10-CM

## 2019-04-20 DIAGNOSIS — R0989 Other specified symptoms and signs involving the circulatory and respiratory systems: Secondary | ICD-10-CM | POA: Diagnosis not present

## 2019-04-20 DIAGNOSIS — E782 Mixed hyperlipidemia: Secondary | ICD-10-CM | POA: Diagnosis not present

## 2019-04-20 DIAGNOSIS — I5033 Acute on chronic diastolic (congestive) heart failure: Secondary | ICD-10-CM | POA: Diagnosis not present

## 2019-04-20 DIAGNOSIS — I10 Essential (primary) hypertension: Secondary | ICD-10-CM

## 2019-04-20 NOTE — Patient Instructions (Signed)
Medication Instructions:  For cramps and leg weakness, hold the crestor for one month  If you need a refill on your cardiac medications before your next appointment, please call your pharmacy.    Lab work: No new labs needed   If you have labs (blood work) drawn today and your tests are completely normal, you will receive your results only by: Marland Kitchen MyChart Message (if you have MyChart) OR . A paper copy in the mail If you have any lab test that is abnormal or we need to change your treatment, we will call you to review the results.   Testing/Procedures: No new testing needed   Follow-Up: At [facility], you and your health needs are our priority.  As part of our continuing mission to provide you with exceptional heart care, we have created designated Provider Care Teams.  These Care Teams include your primary Cardiologist (physician) and Advanced Practice Providers (APPs -  Physician Assistants and Nurse Practitioners) who all work together to provide you with the care you need, when you need it.  . You will need a follow up appointment in 12 months .   Please call our office 2 months in advance to schedule this appointment.    . Providers on your designated Care Team:   . Murray Hodgkins, NP . Christell Faith, PA-C . Marrianne Mood, PA-C  Any Other Special Instructions Will Be Listed Below (If Applicable).  For educational health videos Log in to : [email or web address] Or : [email or web address], password : [email or web address]

## 2019-05-11 ENCOUNTER — Encounter: Payer: Self-pay | Admitting: Family Medicine

## 2019-05-11 ENCOUNTER — Other Ambulatory Visit: Payer: Self-pay

## 2019-05-11 ENCOUNTER — Ambulatory Visit (INDEPENDENT_AMBULATORY_CARE_PROVIDER_SITE_OTHER): Payer: Medicare HMO | Admitting: Family Medicine

## 2019-05-11 VITALS — BP 146/80 | HR 62 | Temp 98.2°F | Ht 62.0 in | Wt 160.0 lb

## 2019-05-11 DIAGNOSIS — R809 Proteinuria, unspecified: Secondary | ICD-10-CM

## 2019-05-11 DIAGNOSIS — E1129 Type 2 diabetes mellitus with other diabetic kidney complication: Secondary | ICD-10-CM | POA: Diagnosis not present

## 2019-05-11 DIAGNOSIS — G4700 Insomnia, unspecified: Secondary | ICD-10-CM

## 2019-05-11 DIAGNOSIS — E119 Type 2 diabetes mellitus without complications: Secondary | ICD-10-CM | POA: Diagnosis not present

## 2019-05-11 DIAGNOSIS — Z23 Encounter for immunization: Secondary | ICD-10-CM

## 2019-05-11 LAB — POCT GLYCOSYLATED HEMOGLOBIN (HGB A1C): Hemoglobin A1C: 5.8 % — AB (ref 4.0–5.6)

## 2019-05-11 MED ORDER — CARVEDILOL 3.125 MG PO TABS: 3.1250 mg | ORAL_TABLET | Freq: Two times a day (BID) | ORAL | 2 refills | Status: DC

## 2019-05-11 NOTE — Patient Instructions (Signed)
Your sugar was much better today and you deserve a lot of credit.  Don't change your meds for now.   Recheck with A1c at a visit in about 3 months.  Update me as needed in the meantime.  Take care.  Glad to see you.

## 2019-05-11 NOTE — Progress Notes (Signed)
Diabetes:  No meds.   Hypoglycemic episodes: no Hyperglycemic episodes: no Feet problems: mild occ tingling but better than prev.  Blood Sugars averaging: 98 at home on recent check.  Checked episodically.   A1c down to 5.8.  She avoids prolonged fasting.   D/w pt about diet and exercise.  She has been eating fruits and veggies.    She has some sweats prev but that seemed to improve off HCTZ.    Insomnia better with PM valium, with relief.  Upheaval d/w pt.    Meds, vitals, and allergies reviewed.  ROS: Per HPI unless specifically indicated in ROS section   GEN: nad, alert and oriented HEENT: ncat NECK: supple w/o LA CV: rrr. PULM: ctab, no inc wob ABD: soft, +bs EXT: no edema SKIN: no acute rash  Diabetic foot exam: Normal inspection No skin breakdown No calluses  Normal DP pulses Normal sensation to light touch and monofilament Nails normal

## 2019-05-14 NOTE — Assessment & Plan Note (Signed)
No change in meds in the meantime. Recheck with A1c at a visit in about 3 months.  Update me as needed in the meantime.  She agrees.  I thanked her for her effort.

## 2019-05-14 NOTE — Assessment & Plan Note (Signed)
Insomnia better with PM valium, with relief.  Upheaval d/w pt.

## 2019-05-15 ENCOUNTER — Telehealth: Payer: Self-pay | Admitting: Cardiovascular Disease

## 2019-05-15 NOTE — Telephone Encounter (Signed)
Spoke with patient and she reviewed that she was instructed to hold crestor to see if her symptoms improved and if so then she may be able to try injectable medication. Reviewed all medications tried and let her know that I would check with provider and possibly send in something new. Discussed how that process works and that once entered we usually have to submit prior authorization which can take some time. Advised that I would check with Dr. Rockey Situ and would give her a call back with his recommendations. She verbalized understanding with no further questions at this time. Documented intolerance to Crestor.

## 2019-05-15 NOTE — Telephone Encounter (Signed)
Patient wanted to let us know that since she has stopped the Crestor, her cramps have stopped

## 2019-05-16 NOTE — Telephone Encounter (Signed)
-----   Message from Tonia Ghent, MD sent at 05/14/2019 11:30 PM EDT ----- Please have her call the vascular clinic.  It looks like she is due for follow-up carotid ultrasound and follow-up with them.  Thanks.

## 2019-05-16 NOTE — Telephone Encounter (Signed)
Left detailed message with patient's husband who says she is aware of being overdue for that appointment.  DPR

## 2019-05-17 NOTE — Telephone Encounter (Signed)
Would send in prescription for repatha She has tried several statins with myalgias

## 2019-05-18 ENCOUNTER — Other Ambulatory Visit: Payer: Self-pay | Admitting: Cardiovascular Disease

## 2019-05-18 MED ORDER — REPATHA SURECLICK 140 MG/ML ~~LOC~~ SOAJ: 140.0000 mg | SUBCUTANEOUS | 11 refills | Status: DC

## 2019-05-18 NOTE — Telephone Encounter (Signed)
Spoke with patient and reviewed that provider would like her to try Ev[place]. She did want to know if it were expensive is there any assistance. Advised that I would send in the prescription, they usually request prior authorization and that once that is done if the cost is more that $50.00 per month to please call me back and we can apply for patient assistance if needed. She verbalized understanding with no further questions at this time.

## 2019-05-18 NOTE — Addendum Note (Signed)
Addended by: Valora Corporal on: 05/18/2019 09:42 AM   Modules accepted: Orders

## 2019-05-25 ENCOUNTER — Ambulatory Visit
Admission: RE | Admit: 2019-05-25 | Discharge: 2019-05-25 | Disposition: A | Discharge status: Home / Self Care | Payer: Medicare HMO | Source: Ambulatory Visit | Attending: Neurosurgery | Admitting: Neurosurgery

## 2019-05-25 DIAGNOSIS — M961 Postlaminectomy syndrome, not elsewhere classified: Secondary | ICD-10-CM

## 2019-05-25 DIAGNOSIS — M5126 Other intervertebral disc displacement, lumbar region: Secondary | ICD-10-CM | POA: Diagnosis not present

## 2019-05-25 MED ORDER — GADOBENATE DIMEGLUMINE 529 MG/ML IV SOLN
15.0000 mL | Freq: Once | INTRAVENOUS | Status: AC | PRN
  Administered 2019-05-25: 15 mL via INTRAVENOUS

## 2019-05-30 ENCOUNTER — Other Ambulatory Visit: Payer: Self-pay

## 2019-05-30 DIAGNOSIS — I6523 Occlusion and stenosis of bilateral carotid arteries: Secondary | ICD-10-CM

## 2019-06-01 ENCOUNTER — Other Ambulatory Visit: Payer: Self-pay

## 2019-06-01 ENCOUNTER — Encounter: Payer: Self-pay | Admitting: Family

## 2019-06-01 ENCOUNTER — Telehealth: Payer: Self-pay | Admitting: *Deleted

## 2019-06-01 ENCOUNTER — Other Ambulatory Visit: Payer: Self-pay | Admitting: Vascular Surgery

## 2019-06-01 ENCOUNTER — Telehealth: Payer: Self-pay | Admitting: Cardiovascular Disease

## 2019-06-01 ENCOUNTER — Ambulatory Visit (HOSPITAL_COMMUNITY)
Admission: RE | Admit: 2019-06-01 | Discharge: 2019-06-01 | Disposition: A | Discharge status: Home / Self Care | Payer: Medicare HMO | Source: Ambulatory Visit | Attending: Family | Admitting: Family

## 2019-06-01 ENCOUNTER — Ambulatory Visit: Payer: Medicare HMO | Admitting: Family

## 2019-06-01 VITALS — BP 157/79 | HR 74 | Temp 98.1°F | Resp 14 | Ht 62.0 in | Wt 158.2 lb

## 2019-06-01 DIAGNOSIS — R471 Dysarthria and anarthria: Secondary | ICD-10-CM

## 2019-06-01 DIAGNOSIS — I6523 Occlusion and stenosis of bilateral carotid arteries: Secondary | ICD-10-CM | POA: Diagnosis not present

## 2019-06-01 DIAGNOSIS — Z9889 Other specified postprocedural states: Secondary | ICD-10-CM

## 2019-06-01 MED ORDER — DIPHENHYDRAMINE HCL 50 MG PO CAPS: ORAL_CAPSULE | ORAL | 0 refills | Status: DC

## 2019-06-01 MED ORDER — PREDNISONE 50 MG PO TABS: ORAL_TABLET | ORAL | 0 refills | Status: DC

## 2019-06-01 NOTE — Telephone Encounter (Signed)
Pt requiring PA for Repatha 140 mg inj. PA has been submitted through covermymeds. Awaiting Appoval/questionaire.

## 2019-06-01 NOTE — Progress Notes (Signed)
Chief Complaint: Follow up Extracranial Carotid Artery Stenosis   History of Present Illness  Catherine Green is a 78 y.o. female who is s/pright endarterectomy in 1992 and a redo in 2006. She also underwent left endarterectomy in 2015by [name].   She has undergone back surgery for degenerative disc disease; states she has more back problems, states she will be seeing [name].   She states that in the last several months she has had increasing episodes of severe perspiration on the left side of her head, feeling light headed at the same time, she does not recall any unilateral weakness or numbness. She has recently had some episodes of not getting her words out like she wants to.  She states she feels a great deal of stress since her daughter passed away in 09/28/2018.   She had an MR of her brain with and w/o contrast on 08-03-18. This showed no cause for hearing loss. Negative for vestibular schwannoma or other mass in the posterior fossa. Mild chronic microvascular ischemic change in the cerebral white matter bilaterally.  Her walking is limited by back pain.   [name] last evaluated pt on 02-18-16. At that time carotid duplex demonstratedleft internal carotid artery stenosis in the 40-59% whereas her last data interpretation was in the 60-79% range. Right endarterectomy site showedno evidence of stenosis. [name] advised her to follow upin 6 months for repeat carotid duplex.  Pt states he blood pressure at home "stays low, but shoots up in a doctor's office".  She states her cardiologist aware of her heart murmur.    Diabetic:no Tobacco [id number] smoker, quitabout 1998, started in her 20's  Pt meds include: Statin :no, had to stop due to cramping, Repatha is on her med list but she states she has not received it yet from her pharmacy ASA:yes Other anticoagulants/antiplatelets:no   Past Medical History:  Diagnosis Date  . Allergy    hay fever   . Anemia   . Anxiety   . Back pain   . Carotid artery occlusion   . Cerebrovascular disease    extracranial; occlusive  . Chicken pox   . Coronary artery disease   . Depression   . Dizziness   . Dizziness   . DVT (deep venous thrombosis) ([place])   . Fainting spell   . GERD (gastroesophageal reflux disease)   . Headache   . Heart murmur   . Hyperlipidemia   . Hypertension   . Leg pain   . PONV (postoperative nausea and vomiting)    severe nausea and vomiting  . Pre-syncope   . PVD (peripheral vascular disease) ([place])    endarterectomy by [name]  . Seasonal allergies   . Shortness of breath dyspnea    wth ambulation at times  . Swelling of both ankles    and abdomen; takes Lasix when needed  . Thrombophlebitis    following childbirth  . Ulcer     Social History Social History   Tobacco Use  . Smoking status: Former Smoker    Packs/day: 0.30    Years: 15.00    Pack years: 4.50    Types: Cigarettes    Quit date: 08/31/1996    Years since quitting: 22.7  . Smokeless tobacco: Never Used  Substance Use Topics  . Alcohol use: No    Alcohol/week: 0.0 standard drinks  . Drug use: No    Family History Family History  Problem Relation Age of Onset  .  Aneurysm Mother        brain  . Heart disease Mother        Aneyursm   . Hyperlipidemia Mother   . Hypertension Mother   . Varicose Veins Mother   . Bleeding Disorder Mother   . Heart disease Father   . Cirrhosis Father   . Heart attack Father   . Hyperlipidemia Father   . Hypertension Father   . Heart disease Brother        Before age 32  . Aneurysm Brother   . Deep vein thrombosis Brother   . Birth defects Brother   . Hyperlipidemia Brother   . Hypertension Brother   . Hypertension Other   . Cancer Sister        lung  . Heart disease Sister        Aneurysm  . Hyperlipidemia Sister   . Hypertension Sister   . Varicose Veins Sister   . Peripheral vascular disease Daughter   . Hyperlipidemia Daughter    . Hypertension Daughter   . Bleeding Disorder Sister   . Breast cancer Neg Hx   . Colon cancer Neg Hx     Surgical History Past Surgical History:  Procedure Laterality Date  . APPENDECTOMY    . BACK SURGERY  01/14/16  . BREAST SURGERY     saline implants  . CARDIAC CATHETERIZATION    . CAROTID ENDARTERECTOMY  1992  . CAROTID ENDARTERECTOMY Right February 13, 2005   Re-do Right CE  . CATARACT EXTRACTION, BILATERAL  2013  . CHOLECYSTECTOMY    . CHOLECYSTECTOMY, LAPAROSCOPIC  04/02/14   Dr. Rochel Brome  . CORONARY ARTERY BYPASS GRAFT  2006   x3 Dr. Prescott Gum  . ENDARTERECTOMY Left 07/20/2014   Procedure: ENDARTERECTOMY CAROTID-LEFT;  Surgeon: Rosetta Posner, MD;  Location: [facility];  Service: Vascular;  Laterality: Left;  . EYE SURGERY Bilateral Feb. 2013   Cataract Left eye  . SEPTOPLASTY     with bilateral inferior turbinate reductions  . SPINE SURGERY  12/2015  . TOTAL ABDOMINAL HYSTERECTOMY    . UPPER GASTROINTESTINAL ENDOSCOPY     had polyps removed from esophagus  . UPPER GI ENDOSCOPY      Allergies  Allergen Reactions  . Lipitor [Atorvastatin] Nausea And Vomiting  . Oxycodone Hcl Other (See Comments)    Hallucinations   . Protonix [Pantoprazole Sodium] Nausea Only  . Crestor [Rosuvastatin Calcium] Other (See Comments)    myalgias  . Cymbalta [Duloxetine Hcl] Other (See Comments)    GI upset at 30mg   . Doxycycline Other (See Comments)    Weakness, sick to her stomach  . Effexor [Venlafaxine] Other (See Comments)    nausea  . Hydrochlorothiazide Other (See Comments)    Not an allergy but urinary frequency at 25mg   . Hydrocodone Other (See Comments)    hallucinate  . Hydroxyzine Other (See Comments)    Excessive sweating.   Recardo Evangelist [Pregabalin] Other (See Comments)    Edema, SOB, chest tight  . Metoprolol Other (See Comments)    Slowed body down, per pt   . Penicillin G Nausea Only and Other (See Comments)    N&V  . Trazodone And Nefazodone Other (See  Comments)    Sedation   . Zoloft [Sertraline] Other (See Comments)    Excessive sweating  . Augmentin [Amoxicillin-Pot Clavulanate] Nausea And Vomiting  . Codeine Nausea And Vomiting and Other (See Comments)  . Demerol Nausea And Vomiting  . Ivp Dye [  Iodinated Diagnostic Agents] Hives and Itching       . Shellfish Allergy Hives and Itching    Current Outpatient Medications  Medication Sig Dispense Refill  . aspirin 81 MG tablet Take 1 tablet (81 mg total) by mouth daily. 30 tablet   . carvedilol (COREG) 3.125 MG tablet Take 1 tablet (3.125 mg total) by mouth 2 (two) times daily. 180 tablet 2  . Coenzyme Q10 (CO Q10) 100 MG CAPS Take 100 mg by mouth daily.    . diazepam (VALIUM) 2 MG tablet TAKE ONE-HALF TO ONE TABLET BY MOUTH AS NEEDED FOR ANXIETY (SEDATION CAUTION) 20 tablet 1  . Evolocumab (REPATHA SURECLICK) [id number] MG/ML SOAJ Inject 140 mg into the skin every 14 (fourteen) days. 2 pen 11  . ezetimibe (ZETIA) 10 MG tablet TAKE 1 TABLET BY MOUTH DAILY 90 tablet 3  . gabapentin (NEURONTIN) 300 MG capsule Take 1-2 capsules (300-600 mg total) by mouth 3 (three) times daily. 100 capsule 3  . omeprazole (PRILOSEC) 20 MG capsule Take 1 capsule (20 mg total) by mouth daily. 30 capsule 3  . vitamin B-12 (CYANOCOBALAMIN) 1000 MCG tablet Take 1 tablet (1,000 mcg total) by mouth daily.     No current facility-administered medications for this visit.     Review of Systems : See HPI for pertinent positives and negatives.  Physical Examination  Vitals:   06/01/19 1155  BP: (!) 157/79  Pulse: 74  Resp: 14  Temp: 98.1 F (36.7 C)  TempSrc: Temporal  SpO2: 98%  Weight: 158 lb 3.2 oz (71.8 kg)  Height: 5\' 2"  (1.575 m)   Body mass index is 28.94 kg/m.  General: WDWN female in NAD GAIT: normal Eyes: PERRLA HENT: No gross abnormalities.  Pulmonary:  Respirations are non-labored, good air movement in all fields, CTAB, no rales, rhonchi, or wheezes Cardiac: regular rhythm, +  murmur.  VASCULAR EXAM Carotid Bruits Right Left   Positive Positive     Abdominal aortic pulse is not palpable. Radial pulses are 2+ palpable and equal.                                                                                                                            LE Pulses Right Left       POPLITEAL  not palpable   not palpable       POSTERIOR TIBIAL  2+ palpable   2+ palpable        DORSALIS PEDIS      ANTERIOR TIBIAL not palpable  not palpable     Gastrointestinal: soft, nontender, BS WNL, no r/g, no palpable masses. Musculoskeletal: no muscle atrophy/wasting. M/S 4/5 throughout, extremities without ischemic changes Skin: No rashes, no ulcers, no cellulitis.   Neurologic:  A&O X 3; appropriate affect, sensation is normal; speech is normal, CN 2-12 intact, pain and light touch intact in extremities, motor exam as listed above. Psychiatric: Normal thought content, mood appropriate to clinical situation.  DATA  Carotid Duplex (06-01-19): Right Carotid: Velocities in the right ICA are consistent with a 1-39% stenosis. Left Carotid: Velocities in the left ICA are consistent with a 40-59% stenosis. No significant stenosis of the left ECA or bilateral CCA. Known occlusion of the right ECA origin with reconstitution via collaterals.  Vertebrals:  Bilateral vertebral arteries demonstrate antegrade flow. Subclavians: Normal flow hemodynamics were seen in bilateral subclavian arteries. No significant change compared to the exams on 08-18-16, 02-17-17, and 02-22-18.   She had normal ABI's in 2016 with bi and triphasic waveforms.   Assessment: Catherine Green is a 77 y.o. female who is s/pright endarterectomy in 1992 and a redo in 2006. She also underwent left endarterectomy in 2015.  Her atherosclerotic risk factors include 27 years smoking history, quit in 1998. Fortunately she does not have DM. Her walking is limited by exacerbation of lumbar spine issues and  pain.  In the last few months she has had a few episodes of dysarthria and increasing episodes of perfuse perspiration only on the left side of her head. Carotid duplex today shows 1-39% stenosis of the right ICA and 40-59% stenosis of the left IC, no change compared to 02-22-18.  I discussed the above with Dr. Oneida Alar, will get CTA of neck and pt will see [name] afterward.    Plan: CTA of neck ASAP and pt will see [name] afterward.    I discussed in depth with the patient the nature of atherosclerosis, and emphasized the importance of maximal medical management including strict control of blood pressure, blood glucose, and lipid levels, obtaining regular exercise, and continued cessation of smoking.  The patient is aware that without maximal medical management the underlying atherosclerotic disease process will progress, limiting the benefit of any interventions. The patient was given information about stroke prevention and what symptoms should prompt the patient to seek immediate medical care. Thank you for allowing [place] to participate in this patient's care.  Clemon Chambers, RN, MSN, FNP-C [facility] Office: [phone]  Clinic Physician: Laqueta Due  06/01/19 12:23 PM

## 2019-06-01 NOTE — Telephone Encounter (Signed)
I called and spoke with the patient. I advised her per a separate note in her chart from today, Lenda Kelp had initiated a PA for her Repatha and we are waiting for a determination. I have advised her if the PA is approved, then we can contact her pharmacy ([facility]) and have them run the RX to see what the cost would be and go from there. She is aware we will call her back once the PA determination is received. She voices understanding and is agreeable.

## 2019-06-01 NOTE — Telephone Encounter (Signed)
Your information has been submitted to [employer]. [employer] will review the request and will issue a decision, typically within 1-3 days from your submission. You can check the updated outcome later by reopening this request.  If [employer] has not responded in 1-3 days or if you have any questions about your ePA request, please contact [employer] at [phone]. If you think there may be a problem with your PA request, use our live chat feature at the bottom right.

## 2019-06-01 NOTE — Patient Instructions (Signed)

## 2019-06-01 NOTE — Telephone Encounter (Signed)
Please call patient to discuss cholesterol shot, patient states her insurance will not cover it and needs an alternative

## 2019-06-02 NOTE — Telephone Encounter (Signed)
Pt c/o medication issue:  1. Name of Medication: Repatha   2. How are you currently taking this medication (dosage and times per day)?   3. Are you having a reaction (difficulty breathing--STAT)? No   4. What is your medication issue? Patient says this is not affordable .  Please call to discuss

## 2019-06-02 NOTE — Telephone Encounter (Signed)
Per fax from [place], Maryelizabeth Kaufmann [employer] has been approved from 08/31/2018 through 08/31/2019. Call attempted to inform patient-no answer.

## 2019-06-05 NOTE — Telephone Encounter (Signed)
Spoke with husband and he states he went to pharmacy over the weekend and they said they would have Repatha in this week but cost would be $141.  Husband said they were told to call if cost was over $50.  They cannot afford the $141.  Advised I will send to [facility] who was working on this previously to see if there are any other options to help with cost.

## 2019-06-07 NOTE — Telephone Encounter (Signed)
I called and spoke with the patient. I advised her that since the [place] was approved, but not affordable for her, then we could try to apply for patient assistance through the company for her.   She is agreeable with this. She is aware that I will mail a copy of the application to her and she can complete the patient portion of this and then return to our office by mail/ in person for [place] to complete and submit for her.   She voices understanding. Mailing address confirmed with the patient.

## 2019-06-07 NOTE — Telephone Encounter (Signed)
PA approved pt unable to pay co-pay of $141 dollars please advise pt's alternative options.

## 2019-06-08 DIAGNOSIS — M48061 Spinal stenosis, lumbar region without neurogenic claudication: Secondary | ICD-10-CM | POA: Diagnosis not present

## 2019-06-08 DIAGNOSIS — M961 Postlaminectomy syndrome, not elsewhere classified: Secondary | ICD-10-CM | POA: Diagnosis not present

## 2019-06-08 NOTE — Telephone Encounter (Signed)
Repatha patient assistance form placed in the mail today for the patient.

## 2019-06-09 ENCOUNTER — Inpatient Hospital Stay: Admission: RE | Admit: 2019-06-09 | Payer: Medicare HMO | Source: Ambulatory Visit

## 2019-06-19 ENCOUNTER — Ambulatory Visit
Admission: RE | Admit: 2019-06-19 | Discharge: 2019-06-19 | Disposition: A | Discharge status: Home / Self Care | Payer: Medicare HMO | Source: Ambulatory Visit | Attending: Vascular Surgery | Admitting: Vascular Surgery

## 2019-06-19 ENCOUNTER — Other Ambulatory Visit: Payer: Self-pay

## 2019-06-19 DIAGNOSIS — I6523 Occlusion and stenosis of bilateral carotid arteries: Secondary | ICD-10-CM

## 2019-06-19 DIAGNOSIS — R471 Dysarthria and anarthria: Secondary | ICD-10-CM

## 2019-06-19 DIAGNOSIS — I6521 Occlusion and stenosis of right carotid artery: Secondary | ICD-10-CM | POA: Diagnosis not present

## 2019-06-19 MED ORDER — IOPAMIDOL (ISOVUE-370) INJECTION 76%
75.0000 mL | Freq: Once | INTRAVENOUS | Status: AC | PRN
  Administered 2019-06-19: 75 mL via INTRAVENOUS

## 2019-06-27 ENCOUNTER — Ambulatory Visit: Payer: Medicare HMO | Admitting: Vascular Surgery

## 2019-06-27 ENCOUNTER — Other Ambulatory Visit: Payer: Self-pay

## 2019-06-27 ENCOUNTER — Encounter: Payer: Self-pay | Admitting: Vascular Surgery

## 2019-06-27 ENCOUNTER — Encounter (INDEPENDENT_AMBULATORY_CARE_PROVIDER_SITE_OTHER): Payer: Self-pay

## 2019-06-27 VITALS — BP 136/62 | HR 61 | Temp 97.7°F | Resp 20 | Ht 62.0 in | Wt 160.0 lb

## 2019-06-27 DIAGNOSIS — I6523 Occlusion and stenosis of bilateral carotid arteries: Secondary | ICD-10-CM | POA: Diagnosis not present

## 2019-06-27 NOTE — Progress Notes (Signed)
[facility]  Patient name: Catherine Green MRN: [id number] DOB: 12/01/1940 Sex: female  REASON FOR VISIT: Follow-up of extensive carotid disease and discussion of recent CT angiogram  HPI: Catherine Green is a 78 y.o. female with a near 30-year history of carotid disease.  She initially underwent right carotid endarterectomy in 1992.  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  She underwent left carotid endarterectomy in November 2015.  She is also status post coronary artery bypass grafting in 2004.  She has been seen in our office with serial duplex follow-up.  Earlier this month she saw our nurse practitioner and reported episodes of dysarthria.  This was concerning for left hemispheric symptoms and she underwent CT angiogram for further evaluation.  She is here today for discussion of this.  She reports no new symptoms.  Past Medical History:  Diagnosis Date  . Allergy    hay fever  . Anemia   . Anxiety   . Back pain   . Carotid artery occlusion   . Cerebrovascular disease    extracranial; occlusive  . Chicken pox   . Coronary artery disease   . Depression   . Dizziness   . Dizziness   . DVT (deep venous thrombosis) ([place])   . Fainting spell   . GERD (gastroesophageal reflux disease)   . Headache   . Heart murmur   . Hyperlipidemia   . Hypertension   . Leg pain   . PONV (postoperative nausea and vomiting)    severe nausea and vomiting  . Pre-syncope   . PVD (peripheral vascular disease) ([place])    endarterectomy by [name]  . Seasonal allergies   . Shortness of breath dyspnea    wth ambulation at times  . Swelling of both ankles    and abdomen; takes Lasix when needed  . Thrombophlebitis    following childbirth  . Ulcer     Family History  Problem Relation Age of Onset  . Aneurysm Mother        brain   . Heart disease Mother        Aneyursm   . Hyperlipidemia Mother   . Hypertension Mother   . Varicose Veins Mother   . Bleeding Disorder Mother   . Heart disease Father   . Cirrhosis Father   . Heart attack Father   . Hyperlipidemia Father   . Hypertension Father   . Heart disease Brother        Before age 73  . Aneurysm Brother   . Deep vein thrombosis Brother   . Birth defects Brother   . Hyperlipidemia Brother   . Hypertension Brother   . Hypertension Other   . Cancer Sister        lung  . Heart disease Sister        Aneurysm  . Hyperlipidemia Sister   . Hypertension Sister   . Varicose Veins Sister   . Peripheral vascular disease Daughter   . Hyperlipidemia Daughter   . Hypertension Daughter   . Bleeding Disorder Sister   . Breast cancer Neg Hx   . Colon cancer Neg Hx     SOCIAL HISTORY: Social History   Tobacco Use  . Smoking status: Former Smoker    Packs/day: 0.30    Years: 15.00    Pack years: 4.50    Types: Cigarettes  Quit date: 08/31/1996    Years since quitting: 22.8  . Smokeless tobacco: Never Used  Substance Use Topics  . Alcohol use: No    Alcohol/week: 0.0 standard drinks    Allergies  Allergen Reactions  . Lipitor [Atorvastatin] Nausea And Vomiting  . Oxycodone Hcl Other (See Comments)    Hallucinations   . Protonix [Pantoprazole Sodium] Nausea Only  . Crestor [Rosuvastatin Calcium] Other (See Comments)    myalgias  . Cymbalta [Duloxetine Hcl] Other (See Comments)    GI upset at 30mg   . Doxycycline Other (See Comments)    Weakness, sick to her stomach  . Effexor [Venlafaxine] Other (See Comments)    nausea  . Hydrochlorothiazide Other (See Comments)    Not an allergy but urinary frequency at 25mg   . Hydrocodone Other (See Comments)    hallucinate  . Hydroxyzine Other (See Comments)    Excessive sweating.   Recardo Evangelist [Pregabalin] Other (See Comments)    Edema, SOB, chest tight  . Metoprolol Other (See Comments)    Slowed body  down, per pt   . Penicillin G Nausea Only and Other (See Comments)    N&V  . Trazodone And Nefazodone Other (See Comments)    Sedation   . Zoloft [Sertraline] Other (See Comments)    Excessive sweating  . Augmentin [Amoxicillin-Pot Clavulanate] Nausea And Vomiting  . Codeine Nausea And Vomiting and Other (See Comments)  . Demerol Nausea And Vomiting  . Ivp Dye [Iodinated Diagnostic Agents] Hives and Itching       . Shellfish Allergy Hives and Itching    Current Outpatient Medications  Medication Sig Dispense Refill  . aspirin 81 MG tablet Take 1 tablet (81 mg total) by mouth daily. 30 tablet   . carvedilol (COREG) 3.125 MG tablet Take 1 tablet (3.125 mg total) by mouth 2 (two) times daily. 180 tablet 2  . Coenzyme Q10 (CO Q10) 100 MG CAPS Take 100 mg by mouth daily.    . diazepam (VALIUM) 2 MG tablet TAKE ONE-HALF TO ONE TABLET BY MOUTH AS NEEDED FOR ANXIETY (SEDATION CAUTION) 20 tablet 1  . diphenhydrAMINE (BENADRYL) 50 MG capsule Take 1 tablet 1 hr before your CT scan 1 capsule 0  . ezetimibe (ZETIA) 10 MG tablet TAKE 1 TABLET BY MOUTH DAILY 90 tablet 3  . gabapentin (NEURONTIN) 300 MG capsule Take 1-2 capsules (300-600 mg total) by mouth 3 (three) times daily. 100 capsule 3  . omeprazole (PRILOSEC) 20 MG capsule Take 1 capsule (20 mg total) by mouth daily. 30 capsule 3  . predniSONE (DELTASONE) 50 MG tablet Take 1 tablet 13 hrs before your CT scan, then 1 tablet 7 hrs before your CT scan, then 1 tablet 1 hr before your CT scan. 3 tablet 0  . vitamin B-12 (CYANOCOBALAMIN) 1000 MCG tablet Take 1 tablet (1,000 mcg total) by mouth daily.    . Evolocumab (REPATHA SURECLICK) [id number] MG/ML SOAJ Inject 140 mg into the skin every 14 (fourteen) days. (Patient not taking: Reported on 06/27/2019) 2 pen 11   No current facility-administered medications for this visit.     REVIEW OF SYSTEMS:  [X]  denotes positive finding, [ ]  denotes negative finding Cardiac  Comments:  Chest pain or chest  pressure:    Shortness of breath upon exertion:    Short of breath when lying flat:    Irregular heart rhythm:        Vascular    Pain in calf, thigh, or hip brought on by  ambulation:    Pain in feet at night that wakes you up from your sleep:     Blood clot in your veins:    Leg swelling:           PHYSICAL EXAM: Vitals:   06/27/19 0956 06/27/19 1001  BP: (!) 144/66 136/62  Pulse: 61   Resp: 20   Temp: 97.7 F (36.5 C)   SpO2: 99%   Weight: 160 lb (72.6 kg)   Height: 5\' 2"  (1.575 m)     GENERAL: The patient is a well-nourished female, in no acute distress. The vital signs are documented above. CARDIOVASCULAR: She does have a harsh right carotid bruit and no bruit on the left.  2+ radial pulses bilaterally PULMONARY: There is good air exchange  MUSCULOSKELETAL: There are no major deformities or cyanosis. NEUROLOGIC: No focal weakness or paresthesias are detected. SKIN: There are no ulcers or rashes noted. PSYCHIATRIC: The patient has a normal affect.  DATA:  I reviewed her CT angiogram images and discussed them at length with the patient.  This does not show any evidence of left carotid stenosis status post endarterectomy.  On her right carotid she does have some diffuse dilatation of her common carotid artery.  Her external carotid is chronically occluded.  Maximal diameter of her right carotid is 1.7 cm with some smooth walled mural thrombus present.  MEDICAL ISSUES: Unclear as to the etiology of her speech issue.  This has resolved.  She does not have any evidence of left-sided carotid disease that would correspond with this.  We will see her again in 1 year with carotid duplex.  If she has persistent symptoms, she will discuss this with Dr. Damita Dunnings for possible neurologic evaluation.    Rosetta Posner, MD FACS [facility] Tel [phone] Pager [phone]

## 2019-06-29 ENCOUNTER — Other Ambulatory Visit: Payer: Self-pay | Admitting: Family Medicine

## 2019-06-29 NOTE — Telephone Encounter (Signed)
Electronic refill request. Diazepam Last office visit:   05/11/2019 Last Filled:    20 tablet 1 04/05/2019  Please advise.

## 2019-06-30 NOTE — Telephone Encounter (Signed)
Sent. Thanks.   

## 2019-07-04 DIAGNOSIS — L570 Actinic keratosis: Secondary | ICD-10-CM | POA: Diagnosis not present

## 2019-07-04 DIAGNOSIS — L821 Other seborrheic keratosis: Secondary | ICD-10-CM | POA: Diagnosis not present

## 2019-07-04 DIAGNOSIS — D225 Melanocytic nevi of trunk: Secondary | ICD-10-CM | POA: Diagnosis not present

## 2019-07-10 ENCOUNTER — Ambulatory Visit (INDEPENDENT_AMBULATORY_CARE_PROVIDER_SITE_OTHER): Payer: Medicare HMO | Admitting: Family Medicine

## 2019-07-10 DIAGNOSIS — F419 Anxiety disorder, unspecified: Secondary | ICD-10-CM | POA: Diagnosis not present

## 2019-07-10 DIAGNOSIS — E782 Mixed hyperlipidemia: Secondary | ICD-10-CM

## 2019-07-10 DIAGNOSIS — J302 Other seasonal allergic rhinitis: Secondary | ICD-10-CM | POA: Diagnosis not present

## 2019-07-10 DIAGNOSIS — R69 Illness, unspecified: Secondary | ICD-10-CM | POA: Diagnosis not present

## 2019-07-10 MED ORDER — GABAPENTIN 300 MG PO CAPS: 300.0000 mg | ORAL_CAPSULE | Freq: Three times a day (TID) | ORAL | Status: DC | PRN

## 2019-07-10 NOTE — Progress Notes (Signed)
Interactive audio and video telecommunications were attempted between this provider and patient, however failed, due to patient having technical difficulties OR patient did not have access to video capability.  We continued and completed visit with audio only.   Virtual Visit via Telephone Note  I connected with patient on 07/10/19  at 10:52 AM  by telephone and verified that I am speaking with the correct person using two identifiers.  Location of patient: home  Location of MD: [place] Name of referring provider (if blank then none associated): Names per persons and role in encounter:  MD: Earlyne Iba, Patient: name listed above.    I discussed the limitations, risks, security and privacy concerns of performing an evaluation and management service by telephone and the availability of in person appointments. I also discussed with the patient that there may be a patient responsible charge related to this service. The patient expressed understanding and agreed to proceed.  CC: "allergies."   History of Present Illness:  Sneezing, coughing, nasal congestion.  No covid exposures. No fevers.  Normal taste and smell.  Sx going on for about a few weeks.  This is typical for patient this time of year.  She has been taking OTC claritin.   She continues to have trouble with anxiety, with some relief from BZD.  No ADE on med.  She has failed tx with mult meds.    She hasn't heard back from cardiology about repatha.     Observations/Objective: nad Speech wnl  Assessment and Plan: Presumed seasonal allergies.  Continue claritin and all on flonase.  She can get that OTC and then update me as needed.    HLD. She hasn't heard back from cardiology about repatha.  I will check on that.    Follow Up Instructions: see above.    I discussed the assessment and treatment plan with the patient. The patient was provided an opportunity to ask questions and all were answered. The patient agreed  with the plan and demonstrated an understanding of the instructions.   The patient was advised to call back or seek an in-person evaluation if the symptoms worsen or if the condition fails to improve as anticipated.  I provided 12 minutes of non-face-to-face time during this encounter.  Elsie Stain, MD

## 2019-07-12 DIAGNOSIS — J302 Other seasonal allergic rhinitis: Secondary | ICD-10-CM | POA: Insufficient documentation

## 2019-07-12 NOTE — Assessment & Plan Note (Signed)
Presumed seasonal allergies.  Continue claritin and all on flonase.  She can get that OTC and then update me as needed.

## 2019-07-12 NOTE — Assessment & Plan Note (Signed)
HLD. She hasn't heard back from cardiology about repatha.  I will check on that with the cardiology service.

## 2019-07-12 NOTE — Assessment & Plan Note (Signed)
See above.  Would continue as is.  She agrees.

## 2019-07-17 ENCOUNTER — Ambulatory Visit (INDEPENDENT_AMBULATORY_CARE_PROVIDER_SITE_OTHER): Payer: Medicare HMO | Admitting: Family Medicine

## 2019-07-17 DIAGNOSIS — R05 Cough: Secondary | ICD-10-CM | POA: Diagnosis not present

## 2019-07-17 DIAGNOSIS — R059 Cough, unspecified: Secondary | ICD-10-CM

## 2019-07-17 DIAGNOSIS — M47816 Spondylosis without myelopathy or radiculopathy, lumbar region: Secondary | ICD-10-CM | POA: Diagnosis not present

## 2019-07-17 DIAGNOSIS — M545 Low back pain: Secondary | ICD-10-CM | POA: Diagnosis not present

## 2019-07-17 MED ORDER — AZITHROMYCIN 250 MG PO TABS: ORAL_TABLET | ORAL | 0 refills | Status: DC

## 2019-07-17 MED ORDER — FUROSEMIDE 20 MG PO TABS: 20.0000 mg | ORAL_TABLET | Freq: Every day | ORAL | Status: DC

## 2019-07-17 NOTE — Progress Notes (Signed)
Interactive audio and video telecommunications were attempted between this provider and patient, however failed, due to patient having technical difficulties OR patient did not have access to video capability.  We continued and completed visit with audio only.   Virtual Visit via Telephone Note  I connected with patient on 07/17/19  at 4:24 PM  by telephone and verified that I am speaking with the correct person using two identifiers.  Location of patient: home  Location of MD: [place] Name of referring provider (if blank then none associated): Names per persons and role in encounter:  MD: Earlyne Iba, Patient: name listed above.    I discussed the limitations, risks, security and privacy concerns of performing an evaluation and management service by telephone and the availability of in person appointments. I also discussed with the patient that there may be a patient responsible charge related to this service. The patient expressed understanding and agreed to proceed.  CC: cough, follow up.   History of Present Illness:   She had ablation at this spine clinic this AM.  She feels some better now with less pain.   She minimal rhinorrhea and congestion, improved from prev with flonase.  She has less exertional capacity in the last few days "like I give it out."  No sputum.  Some cough, esp in the AM, a little less as the day goes on.  No fevers.  No wheeze.  Her ankles are a little puffy.  She is more SOB supine.  Sleeping on 1-2 pillows now.  No chest pain.    She has h/o similar in the past with walking PNA.    We talked about UC eval and she was exceedingly worried about going due to potential covid exposure.   She had her pulse ox measured today at spine clinic and was normal per patient report.     Observations/Objective: nad  Speaking in complete sentences.    Assessment and Plan:  Cough.  Presumed bronchitis, question atypical pneumonia.  Discussed options.  Start  Zithromax, defer UC eval given pandemic consideration per patient request Continue lasix but give double dose if no inc in UOP/dec in swelling.  ER cautions discussed with patient.  She'll update me tomorrow.   She has no known covid exposures.  She agrees with plan.  Follow Up Instructions: see above.    I discussed the assessment and treatment plan with the patient. The patient was provided an opportunity to ask questions and all were answered. The patient agreed with the plan and demonstrated an understanding of the instructions.   The patient was advised to call back or seek an in-person evaluation if the symptoms worsen or if the condition fails to improve as anticipated.  I provided 22 minutes of non-face-to-face time during this encounter.  Elsie Stain, MD

## 2019-07-18 ENCOUNTER — Telehealth: Payer: Self-pay | Admitting: *Deleted

## 2019-07-18 NOTE — Telephone Encounter (Signed)
Patient advised.

## 2019-07-18 NOTE — Telephone Encounter (Signed)
Very glad to hear.  Please have her update me about her breathing, swelling, etc in a few days.  Thanks.

## 2019-07-18 NOTE — Telephone Encounter (Signed)
Patient left a voicemail stating that she talked with Dr. Damita Dunnings yesterday and wanted to let him know that things ae getting better.

## 2019-07-19 ENCOUNTER — Telehealth: Payer: Self-pay | Admitting: *Deleted

## 2019-07-19 ENCOUNTER — Encounter: Payer: Self-pay | Admitting: Family Medicine

## 2019-07-19 NOTE — Assessment & Plan Note (Addendum)
Cough.  Presumed bronchitis, question atypical pneumonia.  Discussed options.  Start Zithromax, defer UC eval given pandemic consideration per patient request Continue lasix but give double dose if no inc in UOP/dec in swelling.  ER cautions discussed with patient.  She'll update me tomorrow.   She has no known covid exposures.  She agrees with plan.

## 2019-07-19 NOTE — Telephone Encounter (Signed)
-----   Message from Minna Merritts, MD sent at 07/19/2019  8:43 AM EST ----- Perhaps we could leave a message Or do MyChart or even send a letter in the mail thx  ----- Message ----- From: Valora Corporal, RN Sent: 07/18/2019   5:28 PM EST To: Minna Merritts, MD  Per fax from [facility] [employer] has been approved from 08/31/2018 through 08/31/2019. Call attempted to inform patient-no answer.    ----- Message ----- From: Minna Merritts, MD Sent: 07/14/2019   6:46 PM EST To: Valora Corporal, RN, Tonia Ghent, MD   Pam, can we look into this. Thx TG  ----- Message ----- From: Tonia Ghent, MD Sent: 07/12/2019  11:38 PM EST To: Minna Merritts, MD  She had not heard anything back about Repatha.  What is the status on that?  Can you please get your staff to check on that?  Thanks.  Brigitte Pulse

## 2019-07-19 NOTE — Telephone Encounter (Signed)
Noted. Thanks.

## 2019-07-20 ENCOUNTER — Telehealth: Payer: Self-pay | Admitting: Cardiovascular Disease

## 2019-07-20 NOTE — Telephone Encounter (Signed)
Completed Patient Assistance forms for [place] faxed to [employer]- [phone].  Confirmation received.   All paperwork filed in patient assistance filing cabinet.

## 2019-07-24 DIAGNOSIS — M47816 Spondylosis without myelopathy or radiculopathy, lumbar region: Secondary | ICD-10-CM | POA: Diagnosis not present

## 2019-07-31 NOTE — Telephone Encounter (Signed)
Called patient. She is upset because she cannot afford $141 dollars twice a month for the Repatha injection. I encouraged her to call her insurance but says this is what is would cost. She is upset because she has a strong family history of heart attacks, aneurysms and strokes. Advised I will see if our pharmacist has any other suggestions.

## 2019-07-31 NOTE — Telephone Encounter (Signed)
Called and spoke w/pt regarding her needing assistance for the repatha. I instructed the pt to apply for healthwell by phone. Pt voiced understanding

## 2019-07-31 NOTE — Telephone Encounter (Addendum)
Patient notified per [facility] that she does not meet the eligibility criteria to receive Repatha due to her insurance plan indicated she has insurance coverage for the medication requested. Please review the patients medications and contact the patient on what she needs to do regarding Repatha because she stated, "I can't afford to take the [place]."

## 2019-08-01 DIAGNOSIS — D485 Neoplasm of uncertain behavior of skin: Secondary | ICD-10-CM | POA: Diagnosis not present

## 2019-08-01 DIAGNOSIS — D229 Melanocytic nevi, unspecified: Secondary | ICD-10-CM | POA: Diagnosis not present

## 2019-08-01 DIAGNOSIS — D1801 Hemangioma of skin and subcutaneous tissue: Secondary | ICD-10-CM | POA: Diagnosis not present

## 2019-08-01 DIAGNOSIS — D2339 Other benign neoplasm of skin of other parts of face: Secondary | ICD-10-CM | POA: Diagnosis not present

## 2019-08-01 DIAGNOSIS — L821 Other seborrheic keratosis: Secondary | ICD-10-CM | POA: Diagnosis not present

## 2019-08-01 DIAGNOSIS — L814 Other melanin hyperpigmentation: Secondary | ICD-10-CM | POA: Diagnosis not present

## 2019-08-17 ENCOUNTER — Telehealth: Payer: Self-pay

## 2019-08-17 MED ORDER — REPATHA SURECLICK 140 MG/ML ~~LOC~~ SOAJ: 140.0000 mg | SUBCUTANEOUS | 11 refills | Status: DC

## 2019-08-17 NOTE — Telephone Encounter (Signed)
Called and spoke w/pt regarding the hw approval and sent rx. Pt voiced understanding.

## 2019-08-28 ENCOUNTER — Other Ambulatory Visit: Payer: Self-pay | Admitting: Family Medicine

## 2019-08-28 NOTE — Telephone Encounter (Signed)
Rx was last refills on 06/30/19 for #20 with 1 refills. Patient was last seen for an office visit on 07/10/19 for seasonal allergies & anxiety. Patient has no upcoming appts.   Ok to refill?

## 2019-08-29 NOTE — Telephone Encounter (Signed)
Sent. Thanks.   

## 2019-09-04 ENCOUNTER — Telehealth: Payer: Self-pay

## 2019-09-04 NOTE — Telephone Encounter (Signed)
Prior Authorization sent to plan.  Catherine Green (Key: [id number]) Repatha [employer] 140MG /ML auto-injectors [employer] Electronic [employer]  Covermymeds  ? Information regarding your request The patient currently has access to the requested medication and a Prior Authorization is not needed for the patient/medication.

## 2019-09-05 DIAGNOSIS — M545 Low back pain: Secondary | ICD-10-CM | POA: Diagnosis not present

## 2019-09-05 DIAGNOSIS — Z6829 Body mass index (BMI) 29.0-29.9, adult: Secondary | ICD-10-CM | POA: Diagnosis not present

## 2019-09-05 DIAGNOSIS — M961 Postlaminectomy syndrome, not elsewhere classified: Secondary | ICD-10-CM | POA: Diagnosis not present

## 2019-09-05 DIAGNOSIS — I10 Essential (primary) hypertension: Secondary | ICD-10-CM | POA: Diagnosis not present

## 2019-10-24 ENCOUNTER — Ambulatory Visit: Payer: Medicare HMO | Admitting: Internal Medicine

## 2019-10-24 ENCOUNTER — Telehealth: Payer: Self-pay

## 2019-10-24 NOTE — Telephone Encounter (Signed)
Patient advised.

## 2019-10-24 NOTE — Telephone Encounter (Signed)
Pt said for 2 wks pt has had generalized weakness, prod to dry cough, SOB upon laying down, no SOB upon exertion,chest congestion, indigestion; pt said it is not CP. Pt has lightheadedness that comes and goes but usually when she gets up from sitting or laying position. Pt said she took her BP yesterday and cannot remember reading but was OK. Pt has had some chills but no fever. Pt does not want to go to UC and pt scheduled virtual appt for 10/24/19 at 10 AM with Avie Echevaria NP. UC & ED precautions given and pt voiced understanding. Pt will have vitals ready when CMA calls.

## 2019-10-24 NOTE — Telephone Encounter (Signed)
Per Baity's instruction pt advised to go to UC as Sx have been ongoing, although improved due to having SOB while laying down pt was told that she needs to be evaluated in person, not video or phone. Due to the restrictions of in office visits from Covid, we are unable to see pt in office due to Sx... Pt stated that she does not want to go to UC... Told pt that I would forward msg to Dr Damita Dunnings but she needs to be evaluated and UC would be best just in case there is needed imaging etc.

## 2019-10-24 NOTE — Telephone Encounter (Signed)
Unfortunately with the current restrictions, in person (meaning UC) is likely the best option.

## 2019-10-24 NOTE — Progress Notes (Signed)
Pt unable to visit virtually. Was seen 3 months ago for similar issues. Hx of CHF, DM 2, CAD, PVD and GERD. Really needs to be evaluated in office for this. Webb Silversmith, NP

## 2019-10-24 NOTE — Telephone Encounter (Signed)
Noted, will discuss at upcoming appt 

## 2019-11-13 ENCOUNTER — Ambulatory Visit (INDEPENDENT_AMBULATORY_CARE_PROVIDER_SITE_OTHER): Payer: Medicare HMO | Admitting: Family Medicine

## 2019-11-13 ENCOUNTER — Encounter: Payer: Self-pay | Admitting: Family Medicine

## 2019-11-13 ENCOUNTER — Other Ambulatory Visit: Payer: Self-pay

## 2019-11-13 ENCOUNTER — Ambulatory Visit (INDEPENDENT_AMBULATORY_CARE_PROVIDER_SITE_OTHER)
Admission: RE | Admit: 2019-11-13 | Discharge: 2019-11-13 | Disposition: A | Discharge status: Home / Self Care | Payer: Medicare HMO | Source: Ambulatory Visit | Attending: Family Medicine | Admitting: Family Medicine

## 2019-11-13 VITALS — BP 130/70 | HR 69 | Temp 96.7°F | Ht 62.0 in | Wt 163.3 lb

## 2019-11-13 DIAGNOSIS — R300 Dysuria: Secondary | ICD-10-CM | POA: Diagnosis not present

## 2019-11-13 DIAGNOSIS — R109 Unspecified abdominal pain: Secondary | ICD-10-CM | POA: Diagnosis not present

## 2019-11-13 DIAGNOSIS — E1129 Type 2 diabetes mellitus with other diabetic kidney complication: Secondary | ICD-10-CM | POA: Diagnosis not present

## 2019-11-13 DIAGNOSIS — E119 Type 2 diabetes mellitus without complications: Secondary | ICD-10-CM

## 2019-11-13 DIAGNOSIS — F419 Anxiety disorder, unspecified: Secondary | ICD-10-CM | POA: Diagnosis not present

## 2019-11-13 DIAGNOSIS — R69 Illness, unspecified: Secondary | ICD-10-CM | POA: Diagnosis not present

## 2019-11-13 DIAGNOSIS — R809 Proteinuria, unspecified: Secondary | ICD-10-CM

## 2019-11-13 LAB — POC URINALSYSI DIPSTICK (AUTOMATED)
Bilirubin, UA: NEGATIVE
Blood, UA: NEGATIVE
Glucose, UA: NEGATIVE
Ketones, UA: NEGATIVE
Leukocytes, UA: NEGATIVE
Nitrite, UA: NEGATIVE
Protein, UA: NEGATIVE
Spec Grav, UA: 1.02 (ref 1.010–1.025)
Urobilinogen, UA: 1 E.U./dL
pH, UA: 6 (ref 5.0–8.0)

## 2019-11-13 LAB — POCT GLYCOSYLATED HEMOGLOBIN (HGB A1C): Hemoglobin A1C: 5.8 % — AB (ref 4.0–5.6)

## 2019-11-13 MED ORDER — SERTRALINE HCL 25 MG PO TABS: 25.0000 mg | ORAL_TABLET | Freq: Every day | ORAL | 1 refills | Status: DC

## 2019-11-13 MED ORDER — GABAPENTIN 300 MG PO CAPS: 300.0000 mg | ORAL_CAPSULE | Freq: Three times a day (TID) | ORAL | 5 refills | Status: DC | PRN

## 2019-11-13 NOTE — Progress Notes (Signed)
This visit occurred during the SARS-CoV-2 public health emergency.  Safety protocols were in place, including screening questions prior to the visit, additional usage of staff PPE, and extensive cleaning of exam room while observing appropriate contact time as indicated for disinfecting solutions.  Diabetes:  No meds.   Hypoglycemic episodes:no sx  Hyperglycemic episodes: no sx Feet problems: no Blood Sugars averaging: not checked often A1c done at OV, d/w pt.  A1c 5.8.     She has some L lower back pain last week.  Then had pain with urination, she didn't see blood in urine.  Pain is better, now lower in the abdomen. H/o renal stones, this felt similar.  Clearly less pain now.    She needed refill on gabapentin.  Used without adverse effect with improved lower back pain.  She is still anxious.  No SI/HI.  Sig social stressors d/w pt.  She is more irritable.  We talked about low dose of SSRI to see if that would help.    Her breathing is better.  No cough.  Not SOB.    She had covid vaccine.    Meds, vitals, and allergies reviewed.   ROS: Per HPI unless specifically indicated in ROS section   GEN: nad, alert and oriented HEENT: ncat NECK: supple w/o LA CV: rrr. PULM: ctab, no inc wob ABD: soft, +bs, suprapubic area mildly tender  EXT: no edema SKIN: no acute rash

## 2019-11-13 NOTE — Patient Instructions (Signed)
Start sertraline 25mg  a day for now. Let me know if that isn't helping in the next few weeks.  It may take a month to have effect.  Go to the lab on the way out.   If you have mychart we'll likely use that to update you.    If you have more pain then let me know.  Keep drinking enough water to keep your urine clear or light colored.   Take care.  Glad to see you.

## 2019-11-14 LAB — URINE CULTURE
MICRO NUMBER:: 10251917
SPECIMEN QUALITY:: ADEQUATE

## 2019-11-14 NOTE — Assessment & Plan Note (Signed)
A1c done at OV, d/w pt.  A1c 5.8.   No change in meds at this point.  Continue work on diet and exercise as tolerated.  Recheck periodically.  She agrees.

## 2019-11-14 NOTE — Assessment & Plan Note (Signed)
She is still anxious.  No SI/HI.  Sig social stressors d/w pt.  She is more irritable.  We talked about low dose of SSRI to see if that would help.  She can start sertraline 25mg  a day for now.  She will let me know if that isn't helping in the next few weeks.  It may take a month to have effect and she is aware that.  Okay for outpatient follow-up.

## 2019-11-14 NOTE — Assessment & Plan Note (Signed)
She has some L lower back pain last week.  Then had pain with urination, she didn't see blood in urine.  Pain is better, now lower in the abdomen. H/o renal stones, this felt similar.  Clearly less pain now.   She could have already passed a stone.  See notes on KUB and labs.  Still okay for outpatient follow-up.

## 2019-11-27 ENCOUNTER — Other Ambulatory Visit: Payer: Self-pay

## 2019-11-27 ENCOUNTER — Ambulatory Visit (INDEPENDENT_AMBULATORY_CARE_PROVIDER_SITE_OTHER): Payer: Medicare HMO | Admitting: Family Medicine

## 2019-11-27 ENCOUNTER — Encounter: Payer: Self-pay | Admitting: Family Medicine

## 2019-11-27 VITALS — BP 142/62 | HR 62 | Temp 97.3°F | Ht 62.0 in | Wt 163.6 lb

## 2019-11-27 DIAGNOSIS — F419 Anxiety disorder, unspecified: Secondary | ICD-10-CM

## 2019-11-27 DIAGNOSIS — R0600 Dyspnea, unspecified: Secondary | ICD-10-CM | POA: Diagnosis not present

## 2019-11-27 DIAGNOSIS — R0602 Shortness of breath: Secondary | ICD-10-CM | POA: Diagnosis not present

## 2019-11-27 DIAGNOSIS — M545 Low back pain, unspecified: Secondary | ICD-10-CM

## 2019-11-27 DIAGNOSIS — R69 Illness, unspecified: Secondary | ICD-10-CM | POA: Diagnosis not present

## 2019-11-27 DIAGNOSIS — R1010 Upper abdominal pain, unspecified: Secondary | ICD-10-CM

## 2019-11-27 LAB — COMPREHENSIVE METABOLIC PANEL
ALT: 11 U/L (ref 0–35)
AST: 14 U/L (ref 0–37)
Albumin: 4.2 g/dL (ref 3.5–5.2)
Alkaline Phosphatase: 91 U/L (ref 39–117)
BUN: 9 mg/dL (ref 6–23)
CO2: 32 mEq/L (ref 19–32)
Calcium: 9.4 mg/dL (ref 8.4–10.5)
Chloride: 100 mEq/L (ref 96–112)
Creatinine, Ser: 0.83 mg/dL (ref 0.40–1.20)
GFR: 66.19 mL/min (ref >60.00)
Glucose, Bld: 105 mg/dL — ABNORMAL HIGH (ref 70–99)
Potassium: 3.6 mEq/L (ref 3.5–5.1)
Sodium: 141 mEq/L (ref 135–145)
Total Bilirubin: 0.6 mg/dL (ref 0.2–1.2)
Total Protein: 6.1 g/dL (ref 6.0–8.3)

## 2019-11-27 LAB — POC URINALSYSI DIPSTICK (AUTOMATED)
Bilirubin, UA: NEGATIVE
Blood, UA: NEGATIVE
Glucose, UA: NEGATIVE
Ketones, UA: NEGATIVE
Leukocytes, UA: NEGATIVE
Nitrite, UA: NEGATIVE
Protein, UA: NEGATIVE
Spec Grav, UA: 1.01 (ref 1.010–1.025)
Urobilinogen, UA: 0.2 E.U./dL
pH, UA: 6 (ref 5.0–8.0)

## 2019-11-27 LAB — CBC WITH DIFFERENTIAL/PLATELET
Basophils Absolute: 0 10*3/uL (ref 0.0–0.1)
Basophils Relative: 0.3 % (ref 0.0–3.0)
Eosinophils Absolute: 0.1 10*3/uL (ref 0.0–0.7)
Eosinophils Relative: 1 % (ref 0.0–5.0)
HCT: 34.9 % — ABNORMAL LOW (ref 36.0–46.0)
Hemoglobin: 11.3 g/dL — ABNORMAL LOW (ref 12.0–15.0)
Lymphocytes Relative: 18 % (ref 12.0–46.0)
Lymphs Abs: 1.1 10*3/uL (ref 0.7–4.0)
MCHC: 32.4 g/dL (ref 30.0–36.0)
MCV: 84 fl (ref 78.0–100.0)
Monocytes Absolute: 0.6 10*3/uL (ref 0.1–1.0)
Monocytes Relative: 8.6 % (ref 3.0–12.0)
Neutro Abs: 4.6 10*3/uL (ref 1.4–7.7)
Neutrophils Relative %: 72.1 % (ref 43.0–77.0)
Platelets: 406 10*3/uL — ABNORMAL HIGH (ref 150.0–400.0)
RBC: 4.15 Mil/uL (ref 3.87–5.11)
RDW: 14.8 % (ref 11.5–15.5)
WBC: 6.4 10*3/uL (ref 4.0–10.5)

## 2019-11-27 LAB — LIPASE: Lipase: 7 U/L — ABNORMAL LOW (ref 11.0–59.0)

## 2019-11-27 LAB — BRAIN NATRIURETIC PEPTIDE: Pro B Natriuretic peptide (BNP): 300 pg/mL — ABNORMAL HIGH (ref 0.0–100.0)

## 2019-11-27 NOTE — Patient Instructions (Signed)
Go to the lab on the way out.   If you have mychart we'll likely use that to update you.    ?Take care.  Glad to see you. ?Don't change your meds for now.  ?

## 2019-11-27 NOTE — Progress Notes (Signed)
This visit occurred during the SARS-CoV-2 public health emergency.  Safety protocols were in place, including screening questions prior to the visit, additional usage of staff PPE, and extensive cleaning of exam room while observing appropriate contact time as indicated for disinfecting solutions.  Prev with L lower back pain, then lower abd pain, then got better by time of OV on 11/13/19.  KUB that was unremarkable at that point.  She had UCX with likely contaminant.  She had more abd pain in the meantime, not as bad as prev, but still there.  Not burning with urination.  B lower back pain.  Sometimes with occ higher R>L back pain.  She had prev lower back injection for pain.  She is on gabapentin at baseline for lower back pain, with more pain later in the day.    She has BLE edema with foot tenderness.  She usually avoids salt.  She is back on sertraline.  She hasn't had time for full effect yet.  Her anxiety isn't worse. She looks more relaxed than last OV.  She has some SOB that seemed to be related to abd bloating that improved after a BM.    No black stools.  No blood in stools.  No vomiting.    Meds, vitals, and allergies reviewed.   ROS: Per HPI unless specifically indicated in ROS section   GEN: nad, alert and oriented HEENT: ncat NECK: supple w/o LA CV: rrr PULM: ctab, no inc wob ABD: soft, +bs EXT: 1+ BLE edema SKIN: no acute rash Upper abd ttp w/o rebound.  Back and lower abd not ttp.

## 2019-11-28 ENCOUNTER — Other Ambulatory Visit: Payer: Self-pay | Admitting: Family Medicine

## 2019-11-28 DIAGNOSIS — R0602 Shortness of breath: Secondary | ICD-10-CM

## 2019-11-29 ENCOUNTER — Other Ambulatory Visit: Payer: Self-pay | Admitting: Family Medicine

## 2019-11-29 LAB — URINE CULTURE
MICRO NUMBER:: 10302637
Result:: NO GROWTH
SPECIMEN QUALITY:: ADEQUATE

## 2019-11-29 MED ORDER — FUROSEMIDE 20 MG PO TABS: 20.0000 mg | ORAL_TABLET | Freq: Every day | ORAL | 1 refills | Status: DC | PRN

## 2019-12-01 NOTE — Assessment & Plan Note (Signed)
She has mild longstanding anemia.  She has elevated BNP which is suggestive of fluid retention. I would try taking lasix 20mg  a day. If no improvement, then I would inc lasix to 40mg  a day and see if that helps her swelling. I want to check an echo to see if she has a cardiac cause for the inc in swelling.

## 2019-12-01 NOTE — Assessment & Plan Note (Signed)
Would continue gabapentin.

## 2019-12-01 NOTE — Assessment & Plan Note (Signed)
She is back on sertraline.  She hasn't had time for full effect yet.  Her anxiety isn't worse. She looks more relaxed than last OV. Would continue as is for now.  She agrees.

## 2019-12-12 DIAGNOSIS — I10 Essential (primary) hypertension: Secondary | ICD-10-CM | POA: Diagnosis not present

## 2019-12-12 DIAGNOSIS — M545 Low back pain: Secondary | ICD-10-CM | POA: Diagnosis not present

## 2019-12-12 DIAGNOSIS — Z6828 Body mass index (BMI) 28.0-28.9, adult: Secondary | ICD-10-CM | POA: Diagnosis not present

## 2019-12-12 DIAGNOSIS — M961 Postlaminectomy syndrome, not elsewhere classified: Secondary | ICD-10-CM | POA: Diagnosis not present

## 2019-12-14 ENCOUNTER — Other Ambulatory Visit: Payer: Self-pay | Admitting: Neurosurgery

## 2019-12-14 DIAGNOSIS — M961 Postlaminectomy syndrome, not elsewhere classified: Secondary | ICD-10-CM

## 2019-12-18 DIAGNOSIS — D1801 Hemangioma of skin and subcutaneous tissue: Secondary | ICD-10-CM | POA: Diagnosis not present

## 2019-12-18 DIAGNOSIS — L821 Other seborrheic keratosis: Secondary | ICD-10-CM | POA: Diagnosis not present

## 2019-12-18 DIAGNOSIS — L570 Actinic keratosis: Secondary | ICD-10-CM | POA: Diagnosis not present

## 2019-12-18 DIAGNOSIS — D485 Neoplasm of uncertain behavior of skin: Secondary | ICD-10-CM | POA: Diagnosis not present

## 2019-12-18 DIAGNOSIS — D225 Melanocytic nevi of trunk: Secondary | ICD-10-CM | POA: Diagnosis not present

## 2019-12-21 DIAGNOSIS — L570 Actinic keratosis: Secondary | ICD-10-CM | POA: Diagnosis not present

## 2019-12-26 ENCOUNTER — Other Ambulatory Visit: Payer: Self-pay

## 2019-12-26 ENCOUNTER — Ambulatory Visit (INDEPENDENT_AMBULATORY_CARE_PROVIDER_SITE_OTHER): Payer: Medicare HMO

## 2019-12-26 DIAGNOSIS — R0602 Shortness of breath: Secondary | ICD-10-CM

## 2019-12-28 ENCOUNTER — Ambulatory Visit
Admission: RE | Admit: 2019-12-28 | Discharge: 2019-12-28 | Disposition: A | Discharge status: Home / Self Care | Payer: Medicare HMO | Source: Ambulatory Visit | Attending: Neurosurgery | Admitting: Neurosurgery

## 2019-12-28 ENCOUNTER — Other Ambulatory Visit: Payer: Self-pay

## 2019-12-28 DIAGNOSIS — M961 Postlaminectomy syndrome, not elsewhere classified: Secondary | ICD-10-CM

## 2019-12-28 DIAGNOSIS — M545 Low back pain: Secondary | ICD-10-CM | POA: Diagnosis not present

## 2020-01-02 ENCOUNTER — Other Ambulatory Visit: Payer: Self-pay | Admitting: Family Medicine

## 2020-01-11 DIAGNOSIS — M461 Sacroiliitis, not elsewhere classified: Secondary | ICD-10-CM | POA: Diagnosis not present

## 2020-01-11 DIAGNOSIS — M961 Postlaminectomy syndrome, not elsewhere classified: Secondary | ICD-10-CM | POA: Diagnosis not present

## 2020-01-22 ENCOUNTER — Encounter: Payer: Self-pay | Admitting: Family Medicine

## 2020-01-22 ENCOUNTER — Ambulatory Visit (INDEPENDENT_AMBULATORY_CARE_PROVIDER_SITE_OTHER)
Admission: RE | Admit: 2020-01-22 | Discharge: 2020-01-22 | Disposition: A | Discharge status: Home / Self Care | Payer: Medicare HMO | Source: Ambulatory Visit | Attending: Family Medicine | Admitting: Family Medicine

## 2020-01-22 ENCOUNTER — Other Ambulatory Visit: Payer: Self-pay

## 2020-01-22 ENCOUNTER — Ambulatory Visit (INDEPENDENT_AMBULATORY_CARE_PROVIDER_SITE_OTHER): Payer: Medicare HMO | Admitting: Family Medicine

## 2020-01-22 VITALS — BP 150/70 | HR 54 | Temp 97.5°F | Ht 62.0 in | Wt 157.3 lb

## 2020-01-22 DIAGNOSIS — R140 Abdominal distension (gaseous): Secondary | ICD-10-CM

## 2020-01-22 DIAGNOSIS — R0602 Shortness of breath: Secondary | ICD-10-CM

## 2020-01-22 LAB — CBC WITH DIFFERENTIAL/PLATELET
Basophils Absolute: 0 10*3/uL (ref 0.0–0.1)
Basophils Relative: 0.3 % (ref 0.0–3.0)
Eosinophils Absolute: 0.1 10*3/uL (ref 0.0–0.7)
Eosinophils Relative: 1.1 % (ref 0.0–5.0)
HCT: 36.4 % (ref 36.0–46.0)
Hemoglobin: 12 g/dL (ref 12.0–15.0)
Lymphocytes Relative: 16.4 % (ref 12.0–46.0)
Lymphs Abs: 1.2 10*3/uL (ref 0.7–4.0)
MCHC: 32.9 g/dL (ref 30.0–36.0)
MCV: 81.8 fl (ref 78.0–100.0)
Monocytes Absolute: 0.7 10*3/uL (ref 0.1–1.0)
Monocytes Relative: 9.3 % (ref 3.0–12.0)
Neutro Abs: 5.2 10*3/uL (ref 1.4–7.7)
Neutrophils Relative %: 72.9 % (ref 43.0–77.0)
Platelets: 349 10*3/uL (ref 150.0–400.0)
RBC: 4.45 Mil/uL (ref 3.87–5.11)
RDW: 14.9 % (ref 11.5–15.5)
WBC: 7.1 10*3/uL (ref 4.0–10.5)

## 2020-01-22 LAB — COMPREHENSIVE METABOLIC PANEL
ALT: 10 U/L (ref 0–35)
AST: 15 U/L (ref 0–37)
Albumin: 4.4 g/dL (ref 3.5–5.2)
Alkaline Phosphatase: 104 U/L (ref 39–117)
BUN: 16 mg/dL (ref 6–23)
CO2: 32 mEq/L (ref 19–32)
Calcium: 9.5 mg/dL (ref 8.4–10.5)
Chloride: 100 mEq/L (ref 96–112)
Creatinine, Ser: 0.86 mg/dL (ref 0.40–1.20)
GFR: 63.51 mL/min (ref >60.00)
Glucose, Bld: 102 mg/dL — ABNORMAL HIGH (ref 70–99)
Potassium: 3.6 mEq/L (ref 3.5–5.1)
Sodium: 141 mEq/L (ref 135–145)
Total Bilirubin: 0.7 mg/dL (ref 0.2–1.2)
Total Protein: 6.6 g/dL (ref 6.0–8.3)

## 2020-01-22 LAB — BRAIN NATRIURETIC PEPTIDE: Pro B Natriuretic peptide (BNP): 239 pg/mL — ABNORMAL HIGH (ref 0.0–100.0)

## 2020-01-22 NOTE — Progress Notes (Signed)
This visit occurred during the SARS-CoV-2 public health emergency.  Safety protocols were in place, including screening questions prior to the visit, additional usage of staff PPE, and extensive cleaning of exam room while observing appropriate contact time as indicated for disinfecting solutions.  Still on lasix at baseline.  She has some B foot and abd bloating as the day goes on, less in the AM.  Lasix helps some but still has edema come back the next day.  She has some occ dyspnea in the afternoons.  No FCNAVD.  No blood in stool.  Dec in appetite noted.    She is still putting up with back pain at baseline  Meds, vitals, and allergies reviewed.   ROS: Per HPI unless specifically indicated in ROS section   GEN: nad, alert and oriented HEENT: mucous membranes moist NECK: supple w/o LA CV: rrr PULM: ctab, no inc wob ABD: soft, +bs, abdomen slightly protuberant but without rebound. EXT: 1-2+ BLE edema SKIN: no acute rash

## 2020-01-22 NOTE — Patient Instructions (Signed)
Go to the lab on the way out.   If you have mychart we'll likely use that to update you.    If we don't get a definitive answer from the xray and the lab, then we may need to set up an abdominal CT.  We'll be in touch.  Don't change your meds for now.  Take care.  Glad to see you.

## 2020-01-24 ENCOUNTER — Telehealth: Payer: Self-pay | Admitting: Family Medicine

## 2020-01-24 NOTE — Telephone Encounter (Signed)
I need your input on this patient.  She has a small pleural effusion, abdominal bloating, and lower extremity edema that is not responding well to Lasix.  She has known diastolic dysfunction and I think that is likely contributing to all of this.  Is it possible for you to see her back in clinic in the near future?  I appreciate your help.

## 2020-01-24 NOTE — Assessment & Plan Note (Signed)
She has shortness of breath with some lower extremity edema and also a protuberant abdomen without rebound.  Lasix helps some but it does not make a difference the next day after the effect is worn off and she never gets rid of all of the edema.  She not having chest pain.  At this point she still okay for outpatient follow-up but we need to recheck her labs and a chest x-ray.  Her lungs are clear.  She still okay for outpatient follow-up.  At least 30 minutes were devoted to patient care in this encounter (this can potentially include time spent reviewing the patient's file/history, interviewing and examining the patient, counseling/reviewing plan with patient, ordering referrals, ordering tests, reviewing relevant laboratory or x-ray data, and documenting the encounter).

## 2020-01-30 NOTE — Telephone Encounter (Signed)
Scheduled

## 2020-01-30 NOTE — Telephone Encounter (Signed)
Can we put her on my schedule for June 7th, just add on at 10 am  If she is free Thx TG

## 2020-02-03 ENCOUNTER — Other Ambulatory Visit: Payer: Self-pay | Admitting: Family Medicine

## 2020-02-05 ENCOUNTER — Telehealth: Payer: Self-pay | Admitting: Cardiovascular Disease

## 2020-02-05 ENCOUNTER — Ambulatory Visit: Payer: Medicare HMO | Admitting: Cardiovascular Disease

## 2020-02-05 NOTE — Telephone Encounter (Signed)
Scheduled 6/8 from waitlist .  Closing encounter.

## 2020-02-05 NOTE — Telephone Encounter (Signed)
Spoke with patient to reschedule visit provider out today.    Patient having issues with swelling and was asked by pcp to come in .  She declined same day with DOD and declined sooner with APP.   Patient wants to know if Catherine Green can see her asap.   Please advise.  Added to waitlist . Now scheduled in august.

## 2020-02-05 NOTE — Progress Notes (Signed)
Cardiology Office Note  Date:  02/06/2020   ID:  Catherine Green, DOB [birth year], MRN [id number]  PCP:  Tonia Ghent, MD   Chief Complaint  Patient presents with  . office visit    pt states having a hard time lying down at night having to sit up throughout the night/ states having a lot of fluid in abdomin and feet/ very fatigue and SOB. Meds verbally reviewed w/ pt.     HPI:  Catherine Green is a very pleasant 79 year-old woman with history  Former smoker started age 9 , stopped age 33 coronary artery disease,  bypass surgery,  peripheral vascular disease  right CEA, left CEA followed by [name], 01/2018: left ICA are consistent with a 40-59% stenosis. catheterization January 2009 showing patent vein grafts, atretic LIMA to the LAD. Hx of anxiety Back surgery in May 2017, who presents for routine followup of her coronary artery disease.   Last seen in the office August 2020, at that visit reported having worsening leg swelling Also with some leg weakness worse with activity Was tolerating Crestor Zetia but had muscle cramps Chronic GI issues but was somewhat better on PPI Anxiety better, on valium as needed  Having some back pain, chronic issue On Lasix daily, still with leg swelling, abdominal bloating, occasional shortness of breath in the afternoons  X-ray performed Jan 22, 2020 showing small left pleural effusion Aortic atherosclerosis  Echocardiogram April 2021 normal ejection fraction, diastolic dysfunction Unable to estimate right heart pressures IVC did not appear dilated  BNP 2 weeks ago 239, unchanged Creatinine 0.86 BUN 16 potassium 3.6 Takes lasix 40 daily, takes potassium ,  BP very elevated today, 220/ 84 Repeat blood pressure measurement by myself 211 systolic Nighttime: can't breath, 2-3 pillows Weight down 8 pounds in 3 months, missing meals, Gets nausea when eating, food sticks half way down  Was drinking 60 oz a day, "then stopped it" BP  elevated 941D systolic  EKG personally reviewed by myself on todays visit shows normal sinus rhythm with rate 67 bpm, no significant ST or T wave changes  Other past medical history reviewed Chronic issue "all day" with nausea/diarrhea,  After eating, anorexia, abdominal pain   Back surgery in May 2017, No regular exercise program  left ICA are consistent with a 40-59% stenosis. <39% on the right Followed in [place]  past medical history reviewed Previous MRI of her back reviewed with her showing DJD, foraminal narrowing Seen by [name].   Previous symptoms of shortness of breath and weight gain. Improved with Lasix to take as needed.  She had her gallbladder taken out 03/13/2014.  she had "hair loss" on simvastatin, as well as sores on her head. Lipitor caused leg problems/myalgias.    PMH:   has a past medical history of Allergy, Anemia, Anxiety, Back pain, Carotid artery occlusion, Cerebrovascular disease, Chicken pox, Coronary artery disease, Depression, Dizziness, Dizziness, DVT (deep venous thrombosis) (HCC), Fainting spell, GERD (gastroesophageal reflux disease), Headache, Heart murmur, Hyperlipidemia, Hypertension, Leg pain, PONV (postoperative nausea and vomiting), Pre-syncope, PVD (peripheral vascular disease) ([place]), Seasonal allergies, Shortness of breath dyspnea, Swelling of both ankles, Thrombophlebitis, and Ulcer.  PSH:    Past Surgical History:  Procedure Laterality Date  . APPENDECTOMY    . BACK SURGERY  01/14/16  . BREAST SURGERY     saline implants  . CARDIAC CATHETERIZATION    . CAROTID ENDARTERECTOMY  1992  . CAROTID ENDARTERECTOMY Right February 13, 2005  Re-do Right CE  . CATARACT EXTRACTION, BILATERAL  2013  . CHOLECYSTECTOMY    . CHOLECYSTECTOMY, LAPAROSCOPIC  04/02/14   Dr. Rochel Brome  . CORONARY ARTERY BYPASS GRAFT  2006   x3 Dr. Prescott Gum  . ENDARTERECTOMY Left 07/20/2014   Procedure: ENDARTERECTOMY CAROTID-LEFT;  Surgeon: Rosetta Posner, MD;   Location: [facility];  Service: Vascular;  Laterality: Left;  . EYE SURGERY Bilateral Feb. 2013   Cataract Left eye  . SEPTOPLASTY     with bilateral inferior turbinate reductions  . SPINE SURGERY  12/2015  . TOTAL ABDOMINAL HYSTERECTOMY    . UPPER GASTROINTESTINAL ENDOSCOPY     had polyps removed from esophagus  . UPPER GI ENDOSCOPY      Current Outpatient Medications  Medication Sig Dispense Refill  . aspirin 81 MG tablet Take 1 tablet (81 mg total) by mouth daily. 30 tablet   . carvedilol (COREG) 3.125 MG tablet Take 1 tablet (3.125 mg total) by mouth 2 (two) times daily. 180 tablet 2  . Coenzyme Q10 (CO Q10) 100 MG CAPS Take 100 mg by mouth daily.    . Evolocumab (REPATHA SURECLICK) 428 MG/ML SOAJ Inject 140 mg into the skin every 14 (fourteen) days. 2 pen 11  . ezetimibe (ZETIA) 10 MG tablet TAKE 1 TABLET BY MOUTH DAILY 90 tablet 3  . fluticasone (FLONASE) 50 MCG/ACT nasal spray Place 1 spray into both nostrils daily.    . furosemide (LASIX) 20 MG tablet TAKE 1 TO 2 TABLETS EVERY DAY AS NEEDED FOR FLUID 60 tablet 1  . gabapentin (NEURONTIN) 400 MG capsule Take 400 mg by mouth every 8 (eight) hours.    Marland Kitchen loratadine (CLARITIN) 10 MG tablet Take 10 mg by mouth daily.    Marland Kitchen omeprazole (PRILOSEC) 20 MG capsule Take 1 capsule (20 mg total) by mouth daily. 30 capsule 3  . sertraline (ZOLOFT) 25 MG tablet TAKE ONE TABLET BY MOUTH EVERY DAY 90 tablet 1  . vitamin B-12 (CYANOCOBALAMIN) 1000 MCG tablet Take 1 tablet (1,000 mcg total) by mouth daily.     No current facility-administered medications for this visit.     Allergies:   Lipitor [atorvastatin], Oxycodone hcl, Protonix [pantoprazole sodium], Crestor [rosuvastatin calcium], Cymbalta [duloxetine hcl], Doxycycline, Effexor [venlafaxine], Hydrochlorothiazide, Hydrocodone, Hydroxyzine, Lyrica [pregabalin], Metoprolol, Penicillin g, Trazodone and nefazodone, Zoloft [sertraline], Augmentin [amoxicillin-pot clavulanate], Codeine, Demerol, Ivp dye  [iodinated diagnostic agents], and Shellfish allergy   Social History:  The patient  reports that she quit smoking about 23 years ago. Her smoking use included cigarettes. She has a 4.50 pack-year smoking history. She has never used smokeless tobacco. She reports that she does not drink alcohol or use drugs.   Family History:   family history includes Aneurysm in her brother and mother; Birth defects in her brother; Bleeding Disorder in her mother and sister; Cancer in her sister; Cirrhosis in her father; Deep vein thrombosis in her brother; Heart attack in her father; Heart disease in her brother, father, mother, and sister; Hyperlipidemia in her brother, daughter, father, mother, and sister; Hypertension in her brother, daughter, father, mother, sister, and another family member; Peripheral vascular disease in her daughter; Varicose Veins in her mother and sister.    Review of Systems: Review of Systems  Constitutional: Negative.   Respiratory: Positive for shortness of breath.   Cardiovascular: Negative.   Gastrointestinal: Negative.   Musculoskeletal: Positive for back pain.  Neurological: Negative.   Psychiatric/Behavioral: The patient is nervous/anxious and has insomnia.  All other systems reviewed and are negative.   PHYSICAL EXAM: VS:  BP (!) 220/84 (BP Location: Left Arm, Patient Position: Sitting, Cuff Size: Normal)   Pulse 67   Ht 5\' 2"  (1.575 m)   Wt 155 lb 2 oz (70.4 kg)   SpO2 96%   BMI 28.37 kg/m  , BMI Body mass index is 28.37 kg/m. Constitutional:  oriented to person, place, and time. No distress.  HENT:  Head: Grossly normal Eyes:  no discharge. No scleral icterus.  Neck: No JVD, + carotid bruits  Cardiovascular: Regular rate and rhythm, 2/6 SEm RSB,  murmurs appreciated Pulmonary/Chest: Clear to auscultation bilaterally, no wheezes or rails Abdominal: Soft.  no distension.  no tenderness.  Musculoskeletal: Normal range of motion Neurological:  normal muscle  tone. Coordination normal. No atrophy Skin: Skin warm and dry Psychiatric: normal affect, pleasant   Recent Labs: 01/22/2020: ALT 10; BUN 16; Creatinine, Ser 0.86; Hemoglobin 12.0; Platelets 349.0; Potassium 3.6; Pro B Natriuretic peptide (BNP) 239.0; Sodium 141    Lipid Panel Lab Results  Component Value Date   CHOL 125 02/01/2019   HDL 58.40 02/01/2019   LDLCALC 45 02/01/2019   TRIG 109.0 02/01/2019      Wt Readings from Last 3 Encounters:  02/06/20 155 lb 2 oz (70.4 kg)  01/22/20 157 lb 5 oz (71.4 kg)  11/27/19 163 lb 9 oz (74.2 kg)       ASSESSMENT AND PLAN:  Shortness of breath Etiology unclear at this time Unable to exclude component of chronic diastolic CHF Normal ejection fraction, she does have diastolic dysfunction Unable to estimate right heart pressures on echocardiogram but IVC is not documented as being dilated BNP not particularly impressive and has been stable Small pleural effusion noted Clinical exam, no significant JVD In terms of leg swelling, likely component of venous insufficiency, minimal on today's visit -We will recommend we increase Lasix up to 40 milligrams twice daily this week then down to 40 daily next week We will need to add potassium 20 daily given recent potassium 3.6 She has been skipping doses of potassium Could offer right heart catheterization if no improvement in symptoms -Unable to exclude anxiety, she is hyperventilating on today's visit with markedly elevated pressure -Unable to exclude component of COPD, she did smoke at least 30 years which seen.  Sent in inhaler  Adjustment disorder Anxious on today's visit, contributing to insomnia Loss of several family members, May need to take sleep aid at nighttime  Carotid stenosis, bilateral -  Stable disease bilaterally , 40-59% disease on the right on recent ultrasound,  Needs repeat every year or every other year  Essential hypertension -  Blood pressure is well controlled  on today's visit. No changes made to the medications.  Hyperlipidemia, unspecified hyperlipidemia type -  On Crestor and Zetia Cramps, will hold crestor, Might need PCSK9 inh  Atherosclerosis of coronary artery bypass graft of native heart without angina pectoris Currently with no anginal symptoms. No further testing at this time.   Abdominal pain Previous mesenteric ultrasound, flow stable    Total encounter time more than 45 minutes  Greater than 50% was spent in counseling and coordination of care with the patient   Disposition:   F/U  3 months   Orders Placed This Encounter  Procedures  . EKG 12-Lead     Signed, Esmond Plants, M.D., Ph.D. 02/06/2020  [place], [place]

## 2020-02-06 ENCOUNTER — Ambulatory Visit: Payer: Medicare HMO | Admitting: Cardiovascular Disease

## 2020-02-06 ENCOUNTER — Other Ambulatory Visit: Payer: Self-pay

## 2020-02-06 ENCOUNTER — Encounter: Payer: Self-pay | Admitting: Cardiovascular Disease

## 2020-02-06 VITALS — BP 180/90 | HR 67 | Ht 62.0 in | Wt 155.1 lb

## 2020-02-06 DIAGNOSIS — I25118 Atherosclerotic heart disease of native coronary artery with other forms of angina pectoris: Secondary | ICD-10-CM | POA: Diagnosis not present

## 2020-02-06 DIAGNOSIS — I5032 Chronic diastolic (congestive) heart failure: Secondary | ICD-10-CM | POA: Diagnosis not present

## 2020-02-06 DIAGNOSIS — I6523 Occlusion and stenosis of bilateral carotid arteries: Secondary | ICD-10-CM

## 2020-02-06 DIAGNOSIS — I10 Essential (primary) hypertension: Secondary | ICD-10-CM

## 2020-02-06 DIAGNOSIS — I739 Peripheral vascular disease, unspecified: Secondary | ICD-10-CM | POA: Diagnosis not present

## 2020-02-06 DIAGNOSIS — E782 Mixed hyperlipidemia: Secondary | ICD-10-CM

## 2020-02-06 MED ORDER — FUROSEMIDE 20 MG PO TABS
20.0000 mg | ORAL_TABLET | ORAL | 3 refills | Status: DC
Start: 2020-02-06 — End: 2020-02-06

## 2020-02-06 MED ORDER — POTASSIUM CHLORIDE ER 10 MEQ PO TBCR
10.0000 meq | EXTENDED_RELEASE_TABLET | ORAL | 3 refills | Status: DC
Start: 2020-02-06 — End: 2020-05-29

## 2020-02-06 MED ORDER — FUROSEMIDE 20 MG PO TABS
20.0000 mg | ORAL_TABLET | ORAL | 3 refills | Status: DC
Start: 2020-02-06 — End: 2020-03-07

## 2020-02-06 MED ORDER — ISOSORBIDE MONONITRATE ER 30 MG PO TB24
30.0000 mg | ORAL_TABLET | Freq: Every day | ORAL | 3 refills | Status: DC
Start: 2020-02-06 — End: 2020-04-29

## 2020-02-06 MED ORDER — ALBUTEROL SULFATE HFA 108 (90 BASE) MCG/ACT IN AERS
2.0000 | INHALATION_SPRAY | Freq: Four times a day (QID) | RESPIRATORY_TRACT | 0 refills | Status: DC | PRN
Start: 2020-02-06 — End: 2020-07-08

## 2020-02-06 NOTE — Patient Instructions (Addendum)
Medication Instructions:  Please start imdur/isosorbide 30 mg daily  monitor pressure at home  Increase lasix up to 40 mg twice a day this week, take with potassium 20 daily Then back to once a day next week, potassium 10 daily  Albuterol inhaler as needed  If you need a refill on your cardiac medications before your next appointment, please call your pharmacy.    Lab work: No new labs needed   If you have labs (blood work) drawn today and your tests are completely normal, you will receive your results only by: Marland Kitchen MyChart Message (if you have MyChart) OR . A paper copy in the mail If you have any lab test that is abnormal or we need to change your treatment, we will call you to review the results.   Testing/Procedures: No new testing needed   Follow-Up: At [facility], you and your health needs are our priority.  As part of our continuing mission to provide you with exceptional heart care, we have created designated Provider Care Teams.  These Care Teams include your primary Cardiologist (physician) and Advanced Practice Providers (APPs -  Physician Assistants and Nurse Practitioners) who all work together to provide you with the care you need, when you need it.  . You will need a follow up appointment in 1 month   . Providers on your designated Care Team:   . Murray Hodgkins, NP . Christell Faith, PA-C . Marrianne Mood, PA-C  Any Other Special Instructions Will Be Listed Below (If Applicable).  For educational health videos Log in to : [email or web address] Or : [email or web address], password : [email or web address]

## 2020-02-08 ENCOUNTER — Other Ambulatory Visit: Payer: Self-pay | Admitting: Cardiovascular Disease

## 2020-03-04 NOTE — Progress Notes (Signed)
Cardiology Office Note    Date:  03/07/2020   ID:  Catherine Green, DOB Nov 03, 1940, MRN [id number]  PCP:  Tonia Ghent, MD  Cardiologist:  Ida Rogue, MD  Electrophysiologist:  None   Chief Complaint: Follow-up  History of Present Illness:   Catherine Green is a 79 y.o. female with history of CAD status post three-vessel CABG in 2006, bilateral carotid artery disease status post bilateral CEA followed by vascular surgery with most recent carotid artery ultrasound from 06/2019 showing 1 to 39% bilateral ICA stenoses, HTN, HLD, multiple medication intolerances, back pain status post surgery, prior tobacco use starting at age 74 and quitting at age 44 who presents for follow-up of dyspnea.  Most recent cardiac cath from 09/2007 demonstrated an atretic LIMA to LAD with patent vein grafts.  She was seen at her PCPs office in 10/2019 with dyspnea.  BNP mildly elevated at 300.  Her symptoms were felt to be likely multifactorial including volume overload and mild longstanding anemia.  She was started on Lasix 40 mg daily.  Echo in 11/2019 showed a hyperdynamic LVSF with an EF of 65 to 25%, grade 3 diastolic dysfunction, normal RV systolic function and RV cavity size, and trivial mitral regurgitation.  Unable to estimate right heart pressures, IVC not documented as being dilated.  Small pleural effusion was noted.  Following this echo she was taking Lasix 40 mg twice daily with continued swelling.  Recheck at her PCPs office in late 12/2019 demonstrated her weight was down 6 pounds, however she continued to note bilateral pedal edema and abdominal bloating as the day progressed.  She continued to have some occasional dyspnea in the afternoons.  Her appetite remained down.  She was seen in the office on 02/06/2020 noting increased dyspnea, PND, 2-3 pillow orthopnea, and increased fatigue.  BP was elevated in the 956L to 875I systolic.  Her weight was down 8 pounds in the past 3 months with notes indicating  she was missing meals.  She reported nausea when eating with associated dysphagia.  Repeat BNP mildly improved at 239 with stable renal function.  She was taking Lasix 40 mg daily.  Her weight was down 6 pounds when compared to her prior clinic weight on 04/2019.  It was felt there was some component of venous insufficiency with her lower extremity swelling.  It was recommended she increase her Lasix to 40 mg twice daily for 1 week followed by 40 mg daily thereafter.  She comes in today noting an improvement in her dyspnea following diuresis.  She has continued to take Lasix 40 mg twice daily since her last visit.  Today, her weight is down another 6 pounds when compared to her last clinic weight.  Her main complaint surrounds positional dizziness and generalized fatigue.  She is noted to be orthostatic in the office today.  She reports these symptoms are similar to what she felt leading up to her bypass surgery.  She denies any chest pain.  She has gone from 2-3 pillow orthopnea back down to her baseline one pillow.  Blood pressure is better controlled.   Labs independently reviewed: 12/2019 - BNP 239, potassium 3.6, BUN 16, serum creatinine 0.86, albumin 4.4, AST/ALT normal, Hgb 12.0, PLT 349 10/2019 - A1c 5.8 01/2019 - TC 125, TG 109, HDL 58, LDL 45 03/2018 - TSH normal  Past Medical History:  Diagnosis Date  . Allergy    hay fever  . Anemia   . Anxiety   .  Back pain   . Carotid artery occlusion   . Cerebrovascular disease    extracranial; occlusive  . Chicken pox   . Coronary artery disease   . Depression   . Dizziness   . Dizziness   . DVT (deep venous thrombosis) ([place])   . Fainting spell   . GERD (gastroesophageal reflux disease)   . Headache   . Heart murmur   . Hyperlipidemia   . Hypertension   . Leg pain   . PONV (postoperative nausea and vomiting)    severe nausea and vomiting  . Pre-syncope   . PVD (peripheral vascular disease) ([place])    endarterectomy by [name]  .  Seasonal allergies   . Shortness of breath dyspnea    wth ambulation at times  . Swelling of both ankles    and abdomen; takes Lasix when needed  . Thrombophlebitis    following childbirth  . Ulcer     Past Surgical History:  Procedure Laterality Date  . APPENDECTOMY    . BACK SURGERY  01/14/16  . BREAST SURGERY     saline implants  . CARDIAC CATHETERIZATION    . CAROTID ENDARTERECTOMY  1992  . CAROTID ENDARTERECTOMY Right February 13, 2005   Re-do Right CE  . CATARACT EXTRACTION, BILATERAL  2013  . CHOLECYSTECTOMY    . CHOLECYSTECTOMY, LAPAROSCOPIC  04/02/14   Dr. Rochel Brome  . CORONARY ARTERY BYPASS GRAFT  2006   x3 Dr. Prescott Gum  . ENDARTERECTOMY Left 07/20/2014   Procedure: ENDARTERECTOMY CAROTID-LEFT;  Surgeon: Rosetta Posner, MD;  Location: [facility];  Service: Vascular;  Laterality: Left;  . EYE SURGERY Bilateral Feb. 2013   Cataract Left eye  . SEPTOPLASTY     with bilateral inferior turbinate reductions  . SPINE SURGERY  12/2015  . TOTAL ABDOMINAL HYSTERECTOMY    . UPPER GASTROINTESTINAL ENDOSCOPY     had polyps removed from esophagus  . UPPER GI ENDOSCOPY      Current Medications: Current Meds  Medication Sig  . albuterol (VENTOLIN HFA) 108 (90 Base) MCG/ACT inhaler Inhale 2 puffs into the lungs every 6 (six) hours as needed for wheezing or shortness of breath.  Marland Kitchen aspirin 81 MG tablet Take 1 tablet (81 mg total) by mouth daily.  . carvedilol (COREG) 3.125 MG tablet TAKE 1 TABLET (3.125 MG TOTAL) BY MOUTH 2 (TWO) TIMES DAILY.  Marland Kitchen Coenzyme Q10 (CO Q10) 100 MG CAPS Take 100 mg by mouth daily.  . Evolocumab (REPATHA SURECLICK) 101 MG/ML SOAJ Inject 140 mg into the skin every 14 (fourteen) days.  Marland Kitchen ezetimibe (ZETIA) 10 MG tablet TAKE 1 TABLET BY MOUTH DAILY  . fluticasone (FLONASE) 50 MCG/ACT nasal spray Place 1 spray into both nostrils daily.  . furosemide (LASIX) 20 MG tablet Take 40 mg by mouth 2 (two) times daily.  Marland Kitchen gabapentin (NEURONTIN) 400 MG capsule Take 400 mg by  mouth every 8 (eight) hours.  . isosorbide mononitrate (IMDUR) 30 MG 24 hr tablet Take 1 tablet (30 mg total) by mouth daily.  Marland Kitchen loratadine (CLARITIN) 10 MG tablet Take 10 mg by mouth daily.  Marland Kitchen omeprazole (PRILOSEC) 20 MG capsule Take 1 capsule (20 mg total) by mouth daily.  . potassium chloride (KLOR-CON) 10 MEQ tablet Take 1 tablet (10 mEq total) by mouth as directed. Take 2 tablets (20 mEq) once daily for this week. Then decrease to 1 tablet daily (10 mEq) once daily.  . sertraline (ZOLOFT) 25 MG tablet TAKE ONE TABLET  BY MOUTH EVERY DAY  . vitamin B-12 (CYANOCOBALAMIN) 1000 MCG tablet Take 1 tablet (1,000 mcg total) by mouth daily.    Allergies:   Lipitor [atorvastatin], Oxycodone hcl, Protonix [pantoprazole sodium], Crestor [rosuvastatin calcium], Cymbalta [duloxetine hcl], Doxycycline, Effexor [venlafaxine], Hydrochlorothiazide, Hydrocodone, Hydroxyzine, Lyrica [pregabalin], Metoprolol, Penicillin g, Trazodone and nefazodone, Zoloft [sertraline], Augmentin [amoxicillin-pot clavulanate], Codeine, Demerol, Ivp dye [iodinated diagnostic agents], and Shellfish allergy   Social History   Socioeconomic History  . Marital status: Married    Spouse name: Jeneen Rinks   . Number of children: 1  . Years of education: Not on file  . Highest education level: 11th grade  Occupational History    Comment: Retired   Tobacco Use  . Smoking status: Former Smoker    Packs/day: 0.30    Years: 15.00    Pack years: 4.50    Types: Cigarettes    Quit date: 08/31/1996    Years since quitting: 23.5  . Smokeless tobacco: Never Used  Vaping Use  . Vaping Use: Never used  Substance and Sexual Activity  . Alcohol use: No    Alcohol/week: 0.0 standard drinks  . Drug use: No  . Sexual activity: Not Currently  Other Topics Concern  . Not on file  Social History Narrative   Lives with husband   Work - retired Emergency planning/management officer   Diet - healthy   Right handed    Caffeine- 1 cup per day.   Social Determinants of  Health   Financial Resource Strain:   . Difficulty of Paying Living Expenses:   Food Insecurity:   . Worried About Charity fundraiser in the Last Year:   . Arboriculturist in the Last Year:   Transportation Needs:   . Film/video editor (Medical):   Marland Kitchen Lack of Transportation (Non-Medical):   Physical Activity:   . Days of Exercise per Week:   . Minutes of Exercise per Session:   Stress:   . Feeling of Stress :   Social Connections:   . Frequency of Communication with Friends and Family:   . Frequency of Social Gatherings with Friends and Family:   . Attends Religious Services:   . Active Member of Clubs or Organizations:   . Attends Archivist Meetings:   Marland Kitchen Marital Status:      Family History:  The patient's family history includes Aneurysm in her brother and mother; Birth defects in her brother; Bleeding Disorder in her mother and sister; Cancer in her sister; Cirrhosis in her father; Deep vein thrombosis in her brother; Heart attack in her father; Heart disease in her brother, father, mother, and sister; Hyperlipidemia in her brother, daughter, father, mother, and sister; Hypertension in her brother, daughter, father, mother, sister, and another family member; Peripheral vascular disease in her daughter; Varicose Veins in her mother and sister. There is no history of Breast cancer or Colon cancer.  ROS:   Review of Systems  Constitutional: Positive for malaise/fatigue. Negative for chills, diaphoresis, fever and weight loss.  HENT: Negative for congestion.   Eyes: Negative for discharge and redness.  Respiratory: Negative for cough, sputum production, shortness of breath and wheezing.   Cardiovascular: Negative for chest pain, palpitations, orthopnea, claudication, leg swelling and PND.  Gastrointestinal: Negative for abdominal pain, heartburn, nausea and vomiting.  Genitourinary: Negative for hematuria.  Musculoskeletal: Negative for falls and myalgias.  Skin:  Negative for rash.  Neurological: Positive for dizziness and weakness. Negative for tingling, tremors, sensory change, speech  change, focal weakness and loss of consciousness.  Endo/Heme/Allergies: Does not bruise/bleed easily.  Psychiatric/Behavioral: Negative for substance abuse. The patient is not nervous/anxious.   All other systems reviewed and are negative.    EKGs/Labs/Other Studies Reviewed:    Studies reviewed were summarized above. The additional studies were reviewed today:  2D echo 11/2019: 1. Left ventricular ejection fraction, by estimation, is 65 to 70%. The  left ventricle has normal function. Left ventricular endocardial border  not optimally defined to evaluate regional wall motion. Left ventricular  diastolic parameters are consistent  with Grade III diastolic dysfunction (restrictive). Elevated left atrial  pressure.  2. Right ventricular systolic function is normal. The right ventricular  size is normal. Tricuspid regurgitation signal is inadequate for assessing  PA pressure.  3. The mitral valve is grossly normal. Trivial mitral valve  regurgitation.  4. The aortic valve was not well visualized. Aortic valve regurgitation  is not visualized. No aortic stenosis is present.  5. The inferior vena cava is normal in size with greater than 50%  respiratory variability, suggesting right atrial pressure of 3 mmHg.   EKG:  EKG is ordered today.  The EKG ordered today demonstrates sinus bradycardia, 59 bpm, rare PAC, nonspecific anterior ST-T changes which appear to be slightly more pronounced when compared to prior study  Recent Labs: 01/22/2020: ALT 10; BUN 16; Creatinine, Ser 0.86; Hemoglobin 12.0; Platelets 349.0; Potassium 3.6; Pro B Natriuretic peptide (BNP) 239.0; Sodium 141  Recent Lipid Panel    Component Value Date/Time   CHOL 125 02/01/2019 0907   CHOL 136 06/29/2016 0945   TRIG 109.0 02/01/2019 0907   HDL 58.40 02/01/2019 0907   HDL 57 06/29/2016  0945   CHOLHDL 2 02/01/2019 0907   VLDL 21.8 02/01/2019 0907   LDLCALC 45 02/01/2019 0907   LDLCALC 55 06/29/2016 0945   LDLDIRECT 139.1 04/11/2012 1131    PHYSICAL EXAM:    VS:  BP (!) 130/54 (BP Location: Left Arm, Patient Position: Sitting, Cuff Size: Normal)   Pulse (!) 59   Ht 5\' 2"  (1.575 m)   Wt 149 lb (67.6 kg)   SpO2 97%   BMI 27.25 kg/m   BMI: Body mass index is 27.25 kg/m.  Physical Exam Constitutional:      Appearance: She is well-developed.  HENT:     Head: Normocephalic and atraumatic.  Eyes:     General:        Right eye: No discharge.        Left eye: No discharge.  Neck:     Vascular: No JVD.  Cardiovascular:     Rate and Rhythm: Normal rate and regular rhythm.     Pulses: No midsystolic click and no opening snap.          Posterior tibial pulses are 2+ on the right side and 2+ on the left side.     Heart sounds: S1 normal and S2 normal. Heart sounds not distant. No murmur heard.  No friction rub.  Pulmonary:     Effort: Pulmonary effort is normal. No respiratory distress.     Breath sounds: Normal breath sounds. No decreased breath sounds, wheezing or rales.  Chest:     Chest wall: No tenderness.  Abdominal:     General: There is no distension.     Palpations: Abdomen is soft.     Tenderness: There is no abdominal tenderness.  Musculoskeletal:     Cervical back: Normal range of motion.  Skin:  General: Skin is warm and dry.     Nails: There is no clubbing.  Neurological:     Mental Status: She is alert and oriented to person, place, and time.  Psychiatric:        Speech: Speech normal.        Behavior: Behavior normal.        Thought Content: Thought content normal.        Judgment: Judgment normal.     Wt Readings from Last 3 Encounters:  03/07/20 149 lb (67.6 kg)  02/06/20 155 lb 2 oz (70.4 kg)  01/22/20 157 lb 5 oz (71.4 kg)     Orthostatic vital signs: Lying: 180/74, 61 bpm Sitting: 160/60, 59 bpm, dizzy Standing: 132/62, 61  bpm, dizzy Standing x3 minutes: 145/62, 56 bpm   ReDs vest: 37%  ASSESSMENT & PLAN:   1. CAD status post CABG with stable angina: No chest pain though does note dizziness and fatigue which she states were symptoms similar to her presentation leading up to her bypass. Discussion regarding invasive and noninvasive imaging modalities to evaluate for ischemia given her constellation of symptoms is similar to how she felt leading up to her bypass surgery. She would prefer to proceed with a Lexiscan MPI at this time and declines R/LHC.  Continue current medical regimen including aspirin, carvedilol, Zetia, Repatha, and Imdur.  2. Dyspnea: Much improved following diuresis, and now back to her baseline.  Ischemic evaluation as outlined above.  3. Fatigue: This is her main complaint at this time.  Likely multifactorial including orthostasis/dehydration and physical deconditioning.  Cannot rule out ischemia given symptoms are similar to how she felt leading up to her bypass.  Plan for Devol MPI as outlined above.  Check BMP and CBC.  Prior TSH normal.  4. HTN: Blood pressure is improved in the office today, though she is orthostatic.  Reduce Lasix as below.   5. Orthostatic hypotension: Likely contributing to her overall fatigue.  Reduce Lasix to 40 mg daily. Check BMP given weight loss and orthostasis following diuresis.   6. HLD: LDL 45 from 01/2019 with normal LFT on 12/2019.  Intolerant to statins.  Remains on Zetia and Repatha.  7. Carotid artery disease: Carotid artery ultrasound from 06/2019 showed 1 to 39% bilateral ICA stenosis.  Followed by vascular surgery.  Disposition: F/u with Dr. Rockey Situ or an APP in 1 month.   Medication Adjustments/Labs and Tests Ordered: Current medicines are reviewed at length with the patient today.  Concerns regarding medicines are outlined above. Medication changes, Labs and Tests ordered today are summarized above and listed in the Patient Instructions  accessible in Encounters.   Signed, Christell Faith, PA-C 03/07/2020 9:43 AM     [facility] [address] [place], [place] [address] [phone]

## 2020-03-07 ENCOUNTER — Encounter: Payer: Self-pay | Admitting: Physician Assistant

## 2020-03-07 ENCOUNTER — Ambulatory Visit: Payer: Medicare HMO | Admitting: Physician Assistant

## 2020-03-07 ENCOUNTER — Other Ambulatory Visit: Payer: Self-pay

## 2020-03-07 VITALS — BP 130/54 | HR 59 | Ht 62.0 in | Wt 149.0 lb

## 2020-03-07 DIAGNOSIS — I951 Orthostatic hypotension: Secondary | ICD-10-CM

## 2020-03-07 DIAGNOSIS — E785 Hyperlipidemia, unspecified: Secondary | ICD-10-CM

## 2020-03-07 DIAGNOSIS — I25118 Atherosclerotic heart disease of native coronary artery with other forms of angina pectoris: Secondary | ICD-10-CM | POA: Diagnosis not present

## 2020-03-07 DIAGNOSIS — I209 Angina pectoris, unspecified: Secondary | ICD-10-CM

## 2020-03-07 DIAGNOSIS — I6523 Occlusion and stenosis of bilateral carotid arteries: Secondary | ICD-10-CM | POA: Diagnosis not present

## 2020-03-07 DIAGNOSIS — I10 Essential (primary) hypertension: Secondary | ICD-10-CM | POA: Diagnosis not present

## 2020-03-07 DIAGNOSIS — R0602 Shortness of breath: Secondary | ICD-10-CM | POA: Diagnosis not present

## 2020-03-07 DIAGNOSIS — R5383 Other fatigue: Secondary | ICD-10-CM

## 2020-03-07 MED ORDER — FUROSEMIDE 40 MG PO TABS: 40.0000 mg | ORAL_TABLET | Freq: Every day | ORAL | 3 refills | Status: DC

## 2020-03-07 NOTE — Patient Instructions (Addendum)
Medication Instructions:  Your physician has recommended you make the following change in your medication:  1. DECREASE Furosemide to 40 mg once daily   *If you need a refill on your cardiac medications before your next appointment, please call your pharmacy*   Lab Work: CBC, BMET today  If you have labs (blood work) drawn today and your tests are completely normal, you will receive your results only by: Marland Kitchen MyChart Message (if you have MyChart) OR . A paper copy in the mail If you have any lab test that is abnormal or we need to change your treatment, we will call you to review the results.   Testing/Procedures: [facility]  Your caregiver has ordered a Stress Test with nuclear imaging. The purpose of this test is to evaluate the blood supply to your heart muscle. This procedure is referred to as a "Non-Invasive Stress Test." This is because other than having an IV started in your vein, nothing is inserted or "invades" your body. Cardiac stress tests are done to find areas of poor blood flow to the heart by determining the extent of coronary artery disease (CAD). Some patients exercise on a treadmill, which naturally increases the blood flow to your heart, while others who are  unable to walk on a treadmill due to physical limitations have a pharmacologic/chemical stress agent called Lexiscan . This medicine will mimic walking on a treadmill by temporarily increasing your coronary blood flow.   Please note: these test may take anywhere between 2-4 hours to complete  PLEASE REPORT TO [facility] AT THE FIRST DESK WILL DIRECT YOU WHERE TO GO  Date of Procedure:_____________________________________  Arrival Time for Procedure:______________________________  Instructions regarding medication:   _XX___:  Hold Carvedilol the night before procedure and morning of procedure  _XX___:  Hold other medications as follows: Hold Furosemide the morning of your  test  PLEASE NOTIFY THE OFFICE AT LEAST 24 HOURS IN ADVANCE IF YOU ARE UNABLE TO KEEP YOUR APPOINTMENT.  [phone] AND  PLEASE NOTIFY NUCLEAR MEDICINE AT [facility] AT LEAST 24 HOURS IN ADVANCE IF YOU ARE UNABLE TO KEEP YOUR APPOINTMENT. [phone]  How to prepare for your Myoview test:  1. Do not eat or drink after midnight 2. No caffeine for 24 hours prior to test 3. No smoking 24 hours prior to test. 4. Your medication may be taken with water.  If your doctor stopped a medication because of this test, do not take that medication. 5. Ladies, please do not wear dresses.  Skirts or pants are appropriate. Please wear a short sleeve shirt. 6. No perfume, cologne or lotion. 7. Wear comfortable walking shoes. No heels!   Follow-Up: At [facility], you and your health needs are our priority.  As part of our continuing mission to provide you with exceptional heart care, we have created designated Provider Care Teams.  These Care Teams include your primary Cardiologist (physician) and Advanced Practice Providers (APPs -  Physician Assistants and Nurse Practitioners) who all work together to provide you with the care you need, when you need it.   Your next appointment:   1 month(s)  The format for your next appointment:   In Person  Provider:    You may see Ida Rogue, MD or one of the following Advanced Practice Providers on your designated Care Team:    Murray Hodgkins, NP  Christell Faith, PA-C  Marrianne Mood, PA-C

## 2020-03-08 LAB — BASIC METABOLIC PANEL
BUN/Creatinine Ratio: 13 (ref 12–28)
BUN: 10 mg/dL (ref 8–27)
CO2: 30 mmol/L — ABNORMAL HIGH (ref 20–29)
Calcium: 9.8 mg/dL (ref 8.7–10.3)
Chloride: 100 mmol/L (ref 96–106)
Creatinine, Ser: 0.8 mg/dL (ref 0.57–1.00)
GFR calc Af Amer: 81 mL/min/{1.73_m2} (ref >59)
GFR calc non Af Amer: 70 mL/min/{1.73_m2} (ref >59)
Glucose: 108 mg/dL — ABNORMAL HIGH (ref 65–99)
Potassium: 4 mmol/L (ref 3.5–5.2)
Sodium: 142 mmol/L (ref 134–144)

## 2020-03-08 LAB — CBC
Hematocrit: 37.8 % (ref 34.0–46.6)
Hemoglobin: 12.2 g/dL (ref 11.1–15.9)
MCH: 25.9 pg — ABNORMAL LOW (ref 26.6–33.0)
MCHC: 32.3 g/dL (ref 31.5–35.7)
MCV: 80 fL (ref 79–97)
Platelets: 364 10*3/uL (ref 150–450)
RBC: 4.71 x10E6/uL (ref 3.77–5.28)
RDW: 13.8 % (ref 11.7–15.4)
WBC: 6.4 10*3/uL (ref 3.4–10.8)

## 2020-03-15 ENCOUNTER — Other Ambulatory Visit: Payer: Self-pay

## 2020-03-15 ENCOUNTER — Encounter
Admission: RE | Admit: 2020-03-15 | Discharge: 2020-03-15 | Disposition: A | Discharge status: Home / Self Care | Payer: Medicare HMO | Source: Ambulatory Visit | Attending: Physician Assistant | Admitting: Physician Assistant

## 2020-03-15 DIAGNOSIS — R079 Chest pain, unspecified: Secondary | ICD-10-CM | POA: Diagnosis not present

## 2020-03-15 DIAGNOSIS — R0600 Dyspnea, unspecified: Secondary | ICD-10-CM | POA: Diagnosis not present

## 2020-03-15 DIAGNOSIS — R5383 Other fatigue: Secondary | ICD-10-CM | POA: Diagnosis not present

## 2020-03-15 LAB — NM MYOCAR MULTI W/SPECT W/WALL MOTION / EF
Estimated workload: 1 METS
Exercise duration (min): 0 min
Exercise duration (sec): 0 s
LV dias vol: 51 mL (ref 46–106)
LV sys vol: 17 mL
MPHR: 140 {beats}/min
Peak HR: 86 {beats}/min
Percent HR: 61 %
Rest HR: 67 {beats}/min
SDS: 1
SRS: 1
SSS: 3
TID: 0.97

## 2020-03-15 MED ORDER — TECHNETIUM TC 99M TETROFOSMIN IV KIT
29.4700 | PACK | Freq: Once | INTRAVENOUS | Status: AC | PRN
  Administered 2020-03-15: 29.47 via INTRAVENOUS

## 2020-03-15 MED ORDER — TECHNETIUM TC 99M TETROFOSMIN IV KIT
10.0000 | PACK | Freq: Once | INTRAVENOUS | Status: AC | PRN
  Administered 2020-03-15: 10.08 via INTRAVENOUS

## 2020-03-15 MED ORDER — REGADENOSON 0.4 MG/5ML IV SOLN
0.4000 mg | Freq: Once | INTRAVENOUS | Status: AC
  Administered 2020-03-15: 0.4 mg via INTRAVENOUS

## 2020-03-19 ENCOUNTER — Telehealth: Payer: Self-pay

## 2020-03-19 NOTE — Telephone Encounter (Signed)
-----   Message from Rise Mu, PA-C sent at 03/17/2020  2:35 PM EDT ----- Stress test showed no blockages. CT images did show some aortic atherosclerosis and coronary artery calcium. Overall, this was a low risk scan. Reassuring study.

## 2020-03-19 NOTE — Telephone Encounter (Signed)
Call to patient to review cardiac ct results.    Pt verbalized understanding and has no further questions at this time.    Advised pt to call for any further questions or concerns.  No further orders.  Confirmed upcoming appt next month.

## 2020-03-25 ENCOUNTER — Encounter: Payer: Self-pay | Admitting: Family Medicine

## 2020-03-25 ENCOUNTER — Telehealth: Payer: Self-pay

## 2020-03-25 ENCOUNTER — Ambulatory Visit (INDEPENDENT_AMBULATORY_CARE_PROVIDER_SITE_OTHER): Payer: Medicare HMO | Admitting: Family Medicine

## 2020-03-25 ENCOUNTER — Other Ambulatory Visit: Payer: Self-pay

## 2020-03-25 VITALS — BP 122/48 | HR 64 | Temp 97.7°F | Ht 62.0 in | Wt 146.6 lb

## 2020-03-25 DIAGNOSIS — I10 Essential (primary) hypertension: Secondary | ICD-10-CM

## 2020-03-25 DIAGNOSIS — F329 Major depressive disorder, single episode, unspecified: Secondary | ICD-10-CM

## 2020-03-25 DIAGNOSIS — F419 Anxiety disorder, unspecified: Secondary | ICD-10-CM | POA: Diagnosis not present

## 2020-03-25 DIAGNOSIS — R69 Illness, unspecified: Secondary | ICD-10-CM | POA: Diagnosis not present

## 2020-03-25 DIAGNOSIS — F32A Depression, unspecified: Secondary | ICD-10-CM

## 2020-03-25 MED ORDER — GABAPENTIN 400 MG PO CAPS: 400.0000 mg | ORAL_CAPSULE | Freq: Three times a day (TID) | ORAL | 2 refills | Status: DC

## 2020-03-25 MED ORDER — SERTRALINE HCL 100 MG PO TABS: 100.0000 mg | ORAL_TABLET | Freq: Every day | ORAL | 1 refills | Status: DC

## 2020-03-25 NOTE — Progress Notes (Signed)
This visit occurred during the SARS-CoV-2 public health emergency.  Safety protocols were in place, including screening questions prior to the visit, additional usage of staff PPE, and extensive cleaning of exam room while observing appropriate contact time as indicated for disinfecting solutions.  She is occasionally a little lightheaded on standing.  No falls.  She has had some variable BPs, occ lower than today.  Her swelling is resolved.    She had been on zoloft at baseline, usually 25mg  a day but up to 50mg  a day for the last few weeks w/o relief.  Dealing with grief re: her daughter.  "I don't want to do anything or leave the house."  No SI/HI.  She can remember the last time she had fun.  She isn't sleeping well, is sleeping off and on, sometimes with insomnia.  She has been "moving slower than normal" and her concentration if affected.  Tearful.    Gabapentin helped her back pain.  She didn't think that was affecting her mood.    Meds, vitals, and allergies reviewed.   ROS: Per HPI unless specifically indicated in ROS section   GEN: nad, alert and oriented, not lightheaded on standing.  Tearful during conversation but regains composure. HEENT: ncat NECK: supple w/o LA CV: rrr.  PULM: ctab, no inc wob ABD: soft, +bs EXT: no edema SKIN: no acute rash  At least 30 minutes were devoted to patient care in this encounter (this can potentially include time spent reviewing the patient's file/history, interviewing and examining the patient, counseling/reviewing plan with patient, ordering referrals, ordering tests, reviewing relevant laboratory or x-ray data, and documenting the encounter).

## 2020-03-25 NOTE — Telephone Encounter (Signed)
I apologize.  It should be TID, #90. I resent it for that.  App call from pharmacy.

## 2020-03-25 NOTE — Patient Instructions (Signed)
Try skipping the lasix and potassium one day and see if your swelling is still okay, see if you feel better, and see if your BP is a little higher.   In the meantime, try increasing sertaline to 100mg  a day.  That likely won't have an immediate effect but may help over the next few weeks.  I would try that for now.  I sent the new rx.  You'll only need to take 1 pill a day.  Let me know how you feel in about 10 days, sooner if needed.   Take care.  Glad to see you.

## 2020-03-25 NOTE — Telephone Encounter (Signed)
Catherine Green with [facility] left v/m that pt was seen earlier today and wanted to verify the instructions for gabapentin 400 mg was still the same as take one capsule q8h po since the qty was increased from # 90 to # 120. [facility] request cb.

## 2020-03-25 NOTE — Telephone Encounter (Signed)
My at [facility] advised.

## 2020-03-27 NOTE — Assessment & Plan Note (Signed)
Discussed options.  She can try skipping the lasix and potassium one day and see if her swelling is still okay, see if she feels better, and see if her BP is a little higher.  She will update me as needed.

## 2020-03-27 NOTE — Assessment & Plan Note (Signed)
Discussed options.  She is dealing with the grief associated with the loss of her daughter.  Her concentration is affected and she is tearful.  No suicidal homicidal intent.  Has been taking Zoloft 50 mg a day.  Reasonable to increase to 100 mg and then update me.  Routine timeline cautions discussed with patient she agrees with plan.  She has support at home from her husband.

## 2020-04-11 ENCOUNTER — Other Ambulatory Visit: Payer: Self-pay | Admitting: Ophthalmology

## 2020-04-11 DIAGNOSIS — G902 Horner's syndrome: Secondary | ICD-10-CM

## 2020-04-11 DIAGNOSIS — H35371 Puckering of macula, right eye: Secondary | ICD-10-CM | POA: Diagnosis not present

## 2020-04-11 DIAGNOSIS — Z961 Presence of intraocular lens: Secondary | ICD-10-CM | POA: Diagnosis not present

## 2020-04-11 DIAGNOSIS — H5213 Myopia, bilateral: Secondary | ICD-10-CM | POA: Diagnosis not present

## 2020-04-17 ENCOUNTER — Telehealth: Payer: Self-pay

## 2020-04-17 NOTE — Telephone Encounter (Signed)
Spoke with patient to clarify to which pharmacy she wants me to call in her 13-hour prep; called in to [facility] (in her epic snapshot).  She stated an understanding to take Prednisone 50mg  PO 04/22/20 @ 0300, 0900 and 1500; Benadryl 50mg  PO @ 1500.

## 2020-04-22 ENCOUNTER — Ambulatory Visit
Admission: RE | Admit: 2020-04-22 | Discharge: 2020-04-22 | Disposition: A | Discharge status: Home / Self Care | Payer: Medicare HMO | Source: Ambulatory Visit | Attending: Ophthalmology | Admitting: Ophthalmology

## 2020-04-22 DIAGNOSIS — I6522 Occlusion and stenosis of left carotid artery: Secondary | ICD-10-CM | POA: Diagnosis not present

## 2020-04-22 DIAGNOSIS — G902 Horner's syndrome: Secondary | ICD-10-CM

## 2020-04-22 MED ORDER — IOPAMIDOL (ISOVUE-370) INJECTION 76%
75.0000 mL | Freq: Once | INTRAVENOUS | Status: AC | PRN
  Administered 2020-04-22: 75 mL via INTRAVENOUS

## 2020-04-27 NOTE — Progress Notes (Signed)
Cardiology Office Note  Date:  04/29/2020   ID:  Catherine Green, Catherine Green [birth year], MRN [id number]  PCP:  Tonia Ghent, MD   Chief Complaint  Patient presents with  . other    12 month follow up. Meds reviewed by the pt. verbally. Pt. c/o decrease in BP and is dizzy most days.     HPI:  [name] is a very pleasant 79 year-old woman with history  Former smoker started age 60 , stopped age 70 coronary artery disease,  bypass surgery,  peripheral vascular disease  right CEA, left CEA followed by [name], 01/2018: left ICA are consistent with a 40-59% stenosis. catheterization January 2009 showing patent vein grafts, atretic LIMA to the LAD. Hx of anxiety Back surgery in May 2017, Chronic dizziness who presents for routine followup of her coronary artery disease.   Dizzy on todays visit, reports symptoms have been worse Last seen 03/07/2020 in our office, told to cut lasix back to 40 daily as she was euvolemic For dizziness and fatigue, stress test was ordered Stress test was low risk, no ischemia, normal ejection fraction  In June it was increased up to 40 twice daily given fluid overload and hypertension, Imdur was added at that time  Since her visit in July she has dropped it to lasix 20 daily She did this because she was dizzy Denies any shortness of breath, leg swelling  Has not been able to clean her house or do anything active secondary to dizziness/orthostasis  Orthostatic numbers today were positiver, systolic 073 supine down to 90 with standing 89 systolic standing after 3 minutes no change in heart rate in the low 60s  Other imaging studies reviewed with her CT scan neck 2. Remote right carotid endarterectomy. Positive remodeling/fusiform aneurysm at the distal right common carotid without interval growth; the associated low-density plaque has ulcerated since prior. 3. 70% stenosis of the brachiocephalic origin. 4. 50% left common carotid stenosis.  30%  left ICA stenosis.  EKG personally reviewed by myself on todays visit Shows normal sinus rhythm rate 65 bpm no significant ST-T wave changes  Other past medical history reviewed In the office August 2020, at that visit reported having worsening leg swelling BP very elevated, 220/ 84 Imdur added with extra Lasix  Having some back pain, chronic issue  X-ray performed Jan 22, 2020 showing small left pleural effusion Aortic atherosclerosis  Echocardiogram April 2021 normal ejection fraction, diastolic dysfunction Unable to estimate right heart pressures IVC did not appear dilated  Chronic issue "all day" with nausea/diarrhea,  After eating, anorexia, abdominal pain   Back surgery in May 2017, No regular exercise program  left ICA are consistent with a 40-59% stenosis. <39% on the right Followed in [place]  past medical history reviewed Previous MRI of her back reviewed with her showing DJD, foraminal narrowing Seen by [name].   Previous symptoms of shortness of breath and weight gain. Improved with Lasix to take as needed.  She had her gallbladder taken out 03/13/2014.  she had "hair loss" on simvastatin, as well as sores on her head. Lipitor caused leg problems/myalgias.    PMH:   has a past medical history of Allergy, Anemia, Anxiety, Back pain, Carotid artery occlusion, Cerebrovascular disease, Chicken pox, Coronary artery disease, Depression, Dizziness, Dizziness, DVT (deep venous thrombosis) ([place]), Fainting spell, GERD (gastroesophageal reflux disease), Headache, Heart murmur, Hyperlipidemia, Hypertension, Leg pain, PONV (postoperative nausea and vomiting), Pre-syncope, PVD (peripheral vascular disease) ([place]), Seasonal allergies, Shortness  of breath dyspnea, Swelling of both ankles, Thrombophlebitis, and Ulcer.  PSH:    Past Surgical History:  Procedure Laterality Date  . APPENDECTOMY    . BACK SURGERY  01/14/16  . BREAST SURGERY     saline implants  . CARDIAC  CATHETERIZATION    . CAROTID ENDARTERECTOMY  1992  . CAROTID ENDARTERECTOMY Right February 13, 2005   Re-do Right CE  . CATARACT EXTRACTION, BILATERAL  2013  . CHOLECYSTECTOMY    . CHOLECYSTECTOMY, LAPAROSCOPIC  04/02/14   Dr. Rochel Brome  . CORONARY ARTERY BYPASS GRAFT  2006   x3 Dr. Prescott Gum  . ENDARTERECTOMY Left 07/20/2014   Procedure: ENDARTERECTOMY CAROTID-LEFT;  Surgeon: Rosetta Posner, MD;  Location: [facility];  Service: Vascular;  Laterality: Left;  . EYE SURGERY Bilateral Feb. 2013   Cataract Left eye  . SEPTOPLASTY     with bilateral inferior turbinate reductions  . SPINE SURGERY  12/2015  . TOTAL ABDOMINAL HYSTERECTOMY    . UPPER GASTROINTESTINAL ENDOSCOPY     had polyps removed from esophagus  . UPPER GI ENDOSCOPY      Current Outpatient Medications  Medication Sig Dispense Refill  . albuterol (VENTOLIN HFA) 108 (90 Base) MCG/ACT inhaler Inhale 2 puffs into the lungs every 6 (six) hours as needed for wheezing or shortness of breath. 18 g 0  . aspirin 81 MG tablet Take 1 tablet (81 mg total) by mouth daily. 30 tablet   . carvedilol (COREG) 3.125 MG tablet TAKE 1 TABLET (3.125 MG TOTAL) BY MOUTH 2 (TWO) TIMES DAILY. 180 tablet 0  . Coenzyme Q10 (CO Q10) 100 MG CAPS Take 100 mg by mouth daily.    . Evolocumab (REPATHA SURECLICK) 376 MG/ML SOAJ Inject 140 mg into the skin every 14 (fourteen) days. 2 pen 11  . ezetimibe (ZETIA) 10 MG tablet TAKE 1 TABLET BY MOUTH DAILY 90 tablet 3  . fluticasone (FLONASE) 50 MCG/ACT nasal spray Place 1 spray into both nostrils daily.    . furosemide (LASIX) 40 MG tablet Take 1 tablet (40 mg total) by mouth daily. 90 tablet 3  . gabapentin (NEURONTIN) 400 MG capsule Take 1 capsule (400 mg total) by mouth every 8 (eight) hours. 90 capsule 2  . isosorbide mononitrate (IMDUR) 30 MG 24 hr tablet Take 1 tablet (30 mg total) by mouth daily. 90 tablet 3  . loratadine (CLARITIN) 10 MG tablet Take 10 mg by mouth daily.    Marland Kitchen omeprazole (PRILOSEC) 20 MG capsule  Take 1 capsule (20 mg total) by mouth daily. 30 capsule 3  . potassium chloride (KLOR-CON) 10 MEQ tablet Take 1 tablet (10 mEq total) by mouth as directed. Take 2 tablets (20 mEq) once daily for this week. Then decrease to 1 tablet daily (10 mEq) once daily. 90 tablet 3  . sertraline (ZOLOFT) 100 MG tablet Take 1 tablet (100 mg total) by mouth daily. 90 tablet 1  . vitamin B-12 (CYANOCOBALAMIN) 1000 MCG tablet Take 1 tablet (1,000 mcg total) by mouth daily.     No current facility-administered medications for this visit.     Allergies:   Lyrica [pregabalin], Hydrocodone, Ivp dye [iodinated diagnostic agents], Lipitor [atorvastatin], Oxycodone hcl, Penicillin g, Shellfish allergy, Augmentin [amoxicillin-pot clavulanate], Codeine, Crestor [rosuvastatin calcium], Cymbalta [duloxetine hcl], Demerol, Doxycycline, Effexor [venlafaxine], Hydrochlorothiazide, Hydroxyzine, Metoprolol, Protonix [pantoprazole sodium], and Trazodone and nefazodone   Social History:  The patient  reports that she quit smoking about 23 years ago. Her smoking use included cigarettes. She has  a 4.50 pack-year smoking history. She has never used smokeless tobacco. She reports that she does not drink alcohol and does not use drugs.   Family History:   family history includes Aneurysm in her brother and mother; Birth defects in her brother; Bleeding Disorder in her mother and sister; Cancer in her sister; Cirrhosis in her father; Deep vein thrombosis in her brother; Heart attack in her father; Heart disease in her brother, father, mother, and sister; Hyperlipidemia in her brother, daughter, father, mother, and sister; Hypertension in her brother, daughter, father, mother, sister, and another family member; Peripheral vascular disease in her daughter; Varicose Veins in her mother and sister.    Review of Systems: Review of Systems  Constitutional: Negative.   HENT: Negative.   Respiratory: Negative.   Cardiovascular: Negative.    Gastrointestinal: Negative.   Musculoskeletal: Positive for back pain.  Neurological: Positive for dizziness.  Psychiatric/Behavioral: The patient has insomnia.   All other systems reviewed and are negative.   PHYSICAL EXAM: VS:  BP (!) 110/46 (BP Location: Left Arm, Patient Position: Sitting, Cuff Size: Normal)   Pulse 65   Ht 5\' 2"  (1.575 m)   Wt 148 lb 6 oz (67.3 kg)   SpO2 96%   BMI 27.14 kg/m  , BMI Body mass index is 27.14 kg/m. Constitutional:  oriented to person, place, and time. No distress.  HENT:  Head: Grossly normal Eyes:  no discharge. No scleral icterus.  Neck: No JVD, no carotid bruits  Cardiovascular: Regular rate and rhythm, no murmurs appreciated Pulmonary/Chest: Clear to auscultation bilaterally, no wheezes or rails Abdominal: Soft.  no distension.  no tenderness.  Musculoskeletal: Normal range of motion Neurological:  normal muscle tone. Coordination normal. No atrophy Skin: Skin warm and dry Psychiatric: normal affect, pleasant   Recent Labs: 01/22/2020: ALT 10; Pro B Natriuretic peptide (BNP) 239.0 03/07/2020: BUN 10; Creatinine, Ser 0.80; Hemoglobin 12.2; Platelets 364; Potassium 4.0; Sodium 142    Lipid Panel Lab Results  Component Value Date   CHOL 125 02/01/2019   HDL 58.40 02/01/2019   LDLCALC 45 02/01/2019   TRIG 109.0 02/01/2019      Wt Readings from Last 3 Encounters:  04/29/20 148 lb 6 oz (67.3 kg)  03/25/20 146 lb 9 oz (66.5 kg)  03/07/20 149 lb (67.6 kg)      ASSESSMENT AND PLAN:  Shortness of breath Stable symptoms, appears euvolemic, only taking Lasix 20 daily Recommend once her blood pressure stabilizes and orthostasis symptoms resolved we will go back to Lasix 40 daily  Adjustment disorder Prior history of anxiety, contributing to insomnia Loss of several family members,  Carotid stenosis, bilateral /PAD Stable disease bilaterally , 40-59% disease on the right on  ultrasound,  Repeat CT scan discussed with  her  Essential hypertension -  Orthostatic on today's visit, we will stop her isosorbide Hold carvedilol this evening and tomorrow morning then restart low-dose carvedilol with close monitoring of blood pressure  Hyperlipidemia, unspecified hyperlipidemia type -  Previously on Crestor and Zetia Crestor previously helped for cramping Needs PCSK9 inhibitor, will discuss with her in follow-up  Atherosclerosis of coronary artery bypass graft of native heart without angina pectoris Currently with no symptoms of angina. No further workup at this time. Continue current medication regimen. Stable  Abdominal pain Previous mesenteric ultrasound, flow stable    Total encounter time more than 25 minutes  Greater than 50% was spent in counseling and coordination of care with the patient  No orders of the defined types were placed in this encounter.    Signed, Esmond Plants, M.D., Ph.D. 04/29/2020  [place], [place]

## 2020-04-29 ENCOUNTER — Other Ambulatory Visit: Payer: Self-pay

## 2020-04-29 ENCOUNTER — Encounter: Payer: Self-pay | Admitting: Cardiovascular Disease

## 2020-04-29 ENCOUNTER — Ambulatory Visit: Payer: Medicare HMO | Admitting: Cardiovascular Disease

## 2020-04-29 VITALS — BP 110/46 | HR 65 | Ht 62.0 in | Wt 148.4 lb

## 2020-04-29 DIAGNOSIS — I5032 Chronic diastolic (congestive) heart failure: Secondary | ICD-10-CM | POA: Diagnosis not present

## 2020-04-29 DIAGNOSIS — E785 Hyperlipidemia, unspecified: Secondary | ICD-10-CM

## 2020-04-29 DIAGNOSIS — R0602 Shortness of breath: Secondary | ICD-10-CM

## 2020-04-29 DIAGNOSIS — E782 Mixed hyperlipidemia: Secondary | ICD-10-CM

## 2020-04-29 DIAGNOSIS — I25118 Atherosclerotic heart disease of native coronary artery with other forms of angina pectoris: Secondary | ICD-10-CM | POA: Diagnosis not present

## 2020-04-29 DIAGNOSIS — I6523 Occlusion and stenosis of bilateral carotid arteries: Secondary | ICD-10-CM | POA: Diagnosis not present

## 2020-04-29 DIAGNOSIS — Z951 Presence of aortocoronary bypass graft: Secondary | ICD-10-CM | POA: Diagnosis not present

## 2020-04-29 DIAGNOSIS — I739 Peripheral vascular disease, unspecified: Secondary | ICD-10-CM | POA: Diagnosis not present

## 2020-04-29 NOTE — Patient Instructions (Addendum)
Medication Instructions:  Please stop the imdur/isosorbide  No coreg tonight or tomorrow morning   If you need a refill on your cardiac medications before your next appointment, please call your pharmacy.    Lab work: No new labs needed   If you have labs (blood work) drawn today and your tests are completely normal, you will receive your results only by: Marland Kitchen MyChart Message (if you have MyChart) OR . A paper copy in the mail If you have any lab test that is abnormal or we need to change your treatment, we will call you to review the results.   Testing/Procedures: No new testing needed   Follow-Up: At [facility], you and your health needs are our priority.  As part of our continuing mission to provide you with exceptional heart care, we have created designated Provider Care Teams.  These Care Teams include your primary Cardiologist (physician) and Advanced Practice Providers (APPs -  Physician Assistants and Nurse Practitioners) who all work together to provide you with the care you need, when you need it.  . You will need a follow up appointment in 1 month  . Providers on your designated Care Team:   . Murray Hodgkins, NP . Christell Faith, PA-C . Marrianne Mood, PA-C  Any Other Special Instructions Will Be Listed Below (If Applicable).  COVID-19 Vaccine Information can be found at: ShippingScam.co.uk For questions related to vaccine distribution or appointments, please email [email or web address] or call [phone].

## 2020-05-01 ENCOUNTER — Other Ambulatory Visit: Payer: Self-pay | Admitting: Cardiovascular Disease

## 2020-05-02 ENCOUNTER — Telehealth: Payer: Self-pay | Admitting: Cardiovascular Disease

## 2020-05-02 NOTE — Telephone Encounter (Signed)
She should be off the isosorbide Given drop in pressure on the carvedilol , would stop the carvedilol

## 2020-05-02 NOTE — Telephone Encounter (Signed)
Patient states she went off Carvedilol, and felt really well. States when she started taking it, she felt dizzy, BP dropped 122/55, sitting 134/62, stood up 69/58, states when she stood up she almost passed out. Please call to discuss.

## 2020-05-02 NOTE — Telephone Encounter (Signed)
Patient verbalized understanding to stay off carvedilol and isosorbide.

## 2020-05-13 ENCOUNTER — Other Ambulatory Visit: Payer: Self-pay | Admitting: Cardiovascular Disease

## 2020-05-21 NOTE — Progress Notes (Signed)
Cardiology Office Note    Date:  05/29/2020   ID:  Catherine Green, DOB 08-Dec-1940, MRN [id number]  PCP:  Tonia Ghent, MD  Cardiologist:  Ida Rogue, MD  Electrophysiologist:  None   Chief Complaint: Follow up  History of Present Illness:   Catherine Green is a 79 y.o. female with history of CAD status post three-vessel CABG in 2006, bilateral carotid artery disease status post bilateral CEA followed by vascular surgery, HTN, HLD, multiple medication intolerances, back pain status post surgery, prior tobacco use starting at age 46 and quitting at age 21 who presents for follow-up of chronic dizziness.  Most recent cardiac cath from 09/2007 demonstrated an atretic LIMA to LAD with patent vein grafts.  She was seen at her PCPs office in 10/2019 with dyspnea.  BNP mildly elevated at 300.  Her symptoms were felt to be likely multifactorial including volume overload and mild longstanding anemia.  She was started on Lasix 40 mg daily.  Echo in 11/2019 showed a hyperdynamic LVSF with an EF of 65 to 32%, grade 3 diastolic dysfunction, normal RV systolic function and RV cavity size, and trivial mitral regurgitation.  Unable to estimate right heart pressures, IVC not documented as being dilated.  Small pleural effusion was noted.  Following this echo she was taking Lasix 40 mg twice daily with continued swelling.  Recheck at her PCPs office in late 12/2019 demonstrated her weight was down 6 pounds, however she continued to note bilateral pedal edema and abdominal bloating as the day progressed.  She continued to have some occasional dyspnea in the afternoons.  Her appetite remained down.  She was seen in the office on 02/06/2020 noting increased dyspnea, PND, 2-3 pillow orthopnea, and increased fatigue.  BP was elevated in the 355D to 322G systolic.  Her weight was down 8 pounds in the past 3 months with notes indicating she was missing meals.  She reported nausea when eating with associated dysphagia.   Repeat BNP mildly improved at 239 with stable renal function.  She was taking Lasix 40 mg daily.  It was felt there was some component of venous insufficiency with her lower extremity swelling.  It was recommended she increase her Lasix to 40 mg twice daily for 1 week followed by 40 mg daily thereafter.  She was seen in 02/2020 noting improvement in her dyspnea following diuresis and has continued to take Lasix 40 mg daily since her visit in 01/2020.  Her weight is down another 6 pounds when compared to her last clinic visit.  She did note some positional dizziness and generalized fatigue.  She was orthostatic in the office that day.  Lasix was decreased to 40 mg daily.  Symptoms felt similar to her presentation leading up to her bypass.  In this setting, she underwent Lexiscan MPI on 03/15/2020 which showed no significant ischemia, normal wall motion, EF 76%, attenuation corrected images with aortic atherosclerosis and coronary artery calcifications, and was overall a low risk scan.  EKG showed PVCs.  CTA of the neck from 04/23/2020 showed no dissection or pseudoaneurysm without interval growth of remodeling/fusiform aneurysm within the right internal carotid artery, 30% left ICA stenosis, and 70% stenosis of the brachiocephalic origin.  There was 40% subclavian artery stenosis.  She was last seen in the office on 04/29/2020 noting her dizziness persisted.  Her weight was stable.  She had further decreased her Lasix to 20 mg daily in the setting of ongoing dizziness.  She denied  any shortness of breath or lower extremity swelling.  Orthostatic vital signs remained positive at that visit.  Imdur was discontinued and carvedilol was decreased to 3.125 mg twice daily.  She subsequently contacted our office earlier this month noting continued drops in blood pressure with positional changes patient reported BP of 69/58 upon standing.  In the setting carvedilol and Imdur were held.  She comes in today feeling about the same.   She continues to note significant positional dizziness.  She is no longer on carvedilol or Imdur.  She remains on a Lasix 40 mg daily rather than the previously recommended 20 mg daily.  Home BP readings predominantly are in the 376E to 831D systolic with occasional readings in the 150s to low 176H systolic.  There is 1 isolated reading of 96 systolic.  Heart rates have ranged from the 60s to 80s bpm.  She also notes bilateral lower extremity weakness indicating her legs "gave out" on her yesterday.  Of note, the patient's dizziness dates back at least to 02/2018 with MRI of the brain from 07/2018, performed for hearing loss, showed no evidence of CVA with chronic microvascular ischemic changes noted.  She reports that she continues to take Lasix 40 mg daily secondary to ankle edema.  She denies any chest pain, palpitations, or syncope.  Her weight is down 1 pound today when compared to her last clinic visit approximately 1 month ago.  No orthopnea, PND, abdominal distention, or early satiety.   Labs independently reviewed: 02/2020 - BUN 10, serum creatinine 0.8, potassium 4.0, Hgb 12.2, PLT 364 12/2019 - albumin 4.4, AST/ALT normal 10/2019 - A1c 5.8 01/2019 - TC 125, TG 109, HDL 58, LDL 45 03/2018 - TSH normal  Past Medical History:  Diagnosis Date  . Allergy    hay fever  . Anemia   . Anxiety   . Back pain   . Carotid artery occlusion   . Cerebrovascular disease    extracranial; occlusive  . Chicken pox   . Coronary artery disease   . Depression   . Dizziness   . Dizziness   . DVT (deep venous thrombosis) ([place])   . Fainting spell   . GERD (gastroesophageal reflux disease)   . Headache   . Heart murmur   . Hyperlipidemia   . Hypertension   . Leg pain   . PONV (postoperative nausea and vomiting)    severe nausea and vomiting  . Pre-syncope   . PVD (peripheral vascular disease) ([place])    endarterectomy by [name]  . Seasonal allergies   . Shortness of breath dyspnea    wth  ambulation at times  . Swelling of both ankles    and abdomen; takes Lasix when needed  . Thrombophlebitis    following childbirth  . Ulcer     Past Surgical History:  Procedure Laterality Date  . APPENDECTOMY    . BACK SURGERY  01/14/16  . BREAST SURGERY     saline implants  . CARDIAC CATHETERIZATION    . CAROTID ENDARTERECTOMY  1992  . CAROTID ENDARTERECTOMY Right February 13, 2005   Re-do Right CE  . CATARACT EXTRACTION, BILATERAL  2013  . CHOLECYSTECTOMY    . CHOLECYSTECTOMY, LAPAROSCOPIC  04/02/14   Dr. Rochel Brome  . CORONARY ARTERY BYPASS GRAFT  2006   x3 Dr. Prescott Gum  . ENDARTERECTOMY Left 07/20/2014   Procedure: ENDARTERECTOMY CAROTID-LEFT;  Surgeon: Rosetta Posner, MD;  Location: [facility];  Service: Vascular;  Laterality: Left;  .  EYE SURGERY Bilateral Feb. 2013   Cataract Left eye  . SEPTOPLASTY     with bilateral inferior turbinate reductions  . SPINE SURGERY  12/2015  . TOTAL ABDOMINAL HYSTERECTOMY    . UPPER GASTROINTESTINAL ENDOSCOPY     had polyps removed from esophagus  . UPPER GI ENDOSCOPY      Current Medications: Current Meds  Medication Sig  . albuterol (VENTOLIN HFA) 108 (90 Base) MCG/ACT inhaler Inhale 2 puffs into the lungs every 6 (six) hours as needed for wheezing or shortness of breath.  Marland Kitchen aspirin 81 MG tablet Take 1 tablet (81 mg total) by mouth daily.  . Coenzyme Q10 (CO Q10) 100 MG CAPS Take 100 mg by mouth daily.  . Evolocumab (REPATHA SURECLICK) 465 MG/ML SOAJ Inject 140 mg into the skin every 14 (fourteen) days.  Marland Kitchen ezetimibe (ZETIA) 10 MG tablet TAKE ONE TABLET EVERY DAY  . fluticasone (FLONASE) 50 MCG/ACT nasal spray Place 1 spray into both nostrils daily.  . furosemide (LASIX) 40 MG tablet Take 1 tablet (40 mg total) by mouth daily.  Marland Kitchen gabapentin (NEURONTIN) 400 MG capsule Take 1 capsule (400 mg total) by mouth every 8 (eight) hours.  Marland Kitchen loratadine (CLARITIN) 10 MG tablet Take 10 mg by mouth daily.  Marland Kitchen omeprazole (PRILOSEC) 20 MG capsule Take 1  capsule (20 mg total) by mouth daily.  . sertraline (ZOLOFT) 100 MG tablet Take 1 tablet (100 mg total) by mouth daily.  . vitamin B-12 (CYANOCOBALAMIN) 1000 MCG tablet Take 1 tablet (1,000 mcg total) by mouth daily.    Allergies:   Lyrica [pregabalin], Hydrocodone, Ivp dye [iodinated diagnostic agents], Lipitor [atorvastatin], Oxycodone hcl, Penicillin g, Shellfish allergy, Augmentin [amoxicillin-pot clavulanate], Codeine, Crestor [rosuvastatin calcium], Cymbalta [duloxetine hcl], Demerol, Doxycycline, Effexor [venlafaxine], Hydrochlorothiazide, Hydroxyzine, Metoprolol, Protonix [pantoprazole sodium], and Trazodone and nefazodone   Social History   Socioeconomic History  . Marital status: Married    Spouse name: Jeneen Rinks   . Number of children: 1  . Years of education: Not on file  . Highest education level: 11th grade  Occupational History    Comment: Retired   Tobacco Use  . Smoking status: Former Smoker    Packs/day: 0.30    Years: 15.00    Pack years: 4.50    Types: Cigarettes    Quit date: 08/31/1996    Years since quitting: 23.7  . Smokeless tobacco: Never Used  Vaping Use  . Vaping Use: Never used  Substance and Sexual Activity  . Alcohol use: No    Alcohol/week: 0.0 standard drinks  . Drug use: No  . Sexual activity: Not Currently  Other Topics Concern  . Not on file  Social History Narrative   Lives with husband   Work - retired Emergency planning/management officer   Diet - healthy   Right handed    Caffeine- 1 cup per day.   Social Determinants of Health   Financial Resource Strain:   . Difficulty of Paying Living Expenses: Not on file  Food Insecurity:   . Worried About Charity fundraiser in the Last Year: Not on file  . Ran Out of Food in the Last Year: Not on file  Transportation Needs:   . Lack of Transportation (Medical): Not on file  . Lack of Transportation (Non-Medical): Not on file  Physical Activity:   . Days of Exercise per Week: Not on file  . Minutes of Exercise per  Session: Not on file  Stress:   . Feeling of Stress :  Not on file  Social Connections:   . Frequency of Communication with Friends and Family: Not on file  . Frequency of Social Gatherings with Friends and Family: Not on file  . Attends Religious Services: Not on file  . Active Member of Clubs or Organizations: Not on file  . Attends Archivist Meetings: Not on file  . Marital Status: Not on file     Family History:  The patient's family history includes Aneurysm in her brother and mother; Birth defects in her brother; Bleeding Disorder in her mother and sister; Cancer in her sister; Cirrhosis in her father; Deep vein thrombosis in her brother; Heart attack in her father; Heart disease in her brother, father, mother, and sister; Hyperlipidemia in her brother, daughter, father, mother, and sister; Hypertension in her brother, daughter, father, mother, sister, and another family member; Peripheral vascular disease in her daughter; Varicose Veins in her mother and sister. There is no history of Breast cancer or Colon cancer.  ROS:   Review of Systems  Constitutional: Positive for malaise/fatigue. Negative for chills, diaphoresis, fever and weight loss.  HENT: Negative for congestion.   Eyes: Negative for discharge and redness.  Respiratory: Negative for cough, sputum production, shortness of breath and wheezing.   Cardiovascular: Positive for leg swelling. Negative for chest pain, palpitations, orthopnea, claudication and PND.  Gastrointestinal: Negative for abdominal pain, heartburn, nausea and vomiting.  Musculoskeletal: Negative for falls and myalgias.  Skin: Negative for rash.  Neurological: Positive for dizziness and weakness. Negative for tingling, tremors, sensory change, speech change, focal weakness and loss of consciousness.  Endo/Heme/Allergies: Does not bruise/bleed easily.  Psychiatric/Behavioral: Negative for substance abuse. The patient is not nervous/anxious.   All  other systems reviewed and are negative.    EKGs/Labs/Other Studies Reviewed:    Studies reviewed were summarized above. The additional studies were reviewed today:  2D echo 11/2019: 1. Left ventricular ejection fraction, by estimation, is 65 to 70%. The  left ventricle has normal function. Left ventricular endocardial border  not optimally defined to evaluate regional wall motion. Left ventricular  diastolic parameters are consistent  with Grade III diastolic dysfunction (restrictive). Elevated left atrial  pressure.  2. Right ventricular systolic function is normal. The right ventricular  size is normal. Tricuspid regurgitation signal is inadequate for assessing  PA pressure.  3. The mitral valve is grossly normal. Trivial mitral valve  regurgitation.  4. The aortic valve was not well visualized. Aortic valve regurgitation  is not visualized. No aortic stenosis is present.  5. The inferior vena cava is normal in size with greater than 50%  respiratory variability, suggesting right atrial pressure of 3 mmHg. __________  Carlton Adam MPI 02/2020: Pharmacological myocardial perfusion imaging study with no significant  ischemia Normal wall motion, EF estimated at 76% No EKG changes concerning for ischemia at peak stress or in recovery. Resting EKG with PVCs Attenuation correction images with aortic atherosclerosis and coronary calcification Low risk scan   EKG:  EKG is not ordered today.    Recent Labs: 01/22/2020: ALT 10; Pro B Natriuretic peptide (BNP) 239.0 03/07/2020: BUN 10; Creatinine, Ser 0.80; Hemoglobin 12.2; Platelets 364; Potassium 4.0; Sodium 142  Recent Lipid Panel    Component Value Date/Time   CHOL 125 02/01/2019 0907   CHOL 136 06/29/2016 0945   TRIG 109.0 02/01/2019 0907   HDL 58.40 02/01/2019 0907   HDL 57 06/29/2016 0945   CHOLHDL 2 02/01/2019 0907   VLDL 21.8 02/01/2019 0907   LDLCALC  45 02/01/2019 0907   LDLCALC 55 06/29/2016 0945   LDLDIRECT 139.1  04/11/2012 1131    PHYSICAL EXAM:    VS:  BP 130/80 (BP Location: Right Arm, Patient Position: Sitting, Cuff Size: Normal)   Ht 5\' 2"  (1.575 m)   Wt 147 lb (66.7 kg)   BMI 26.89 kg/m   BMI: Body mass index is 26.89 kg/m.  Physical Exam Vitals reviewed.  Constitutional:      Appearance: She is well-developed.  HENT:     Head: Normocephalic and atraumatic.  Eyes:     General:        Right eye: No discharge.        Left eye: No discharge.  Neck:     Vascular: No JVD.  Cardiovascular:     Rate and Rhythm: Normal rate and regular rhythm.     Pulses: No midsystolic click and no opening snap.          Carotid pulses are on the right side with bruit.      Dorsalis pedis pulses are 2+ on the right side and 2+ on the left side.       Posterior tibial pulses are 2+ on the right side and 2+ on the left side.     Heart sounds: Normal heart sounds, S1 normal and S2 normal. Heart sounds not distant. No murmur heard.  No friction rub.  Pulmonary:     Effort: Pulmonary effort is normal. No respiratory distress.     Breath sounds: Normal breath sounds. No decreased breath sounds, wheezing or rales.  Chest:     Chest wall: No tenderness.  Abdominal:     General: There is no distension.     Palpations: Abdomen is soft.     Tenderness: There is no abdominal tenderness.  Musculoskeletal:     Cervical back: Normal range of motion.  Skin:    General: Skin is warm and dry.     Nails: There is no clubbing.  Neurological:     Mental Status: She is alert and oriented to person, place, and time.  Psychiatric:        Speech: Speech normal.        Behavior: Behavior normal.        Thought Content: Thought content normal.        Judgment: Judgment normal.     Wt Readings from Last 3 Encounters:  05/29/20 147 lb (66.7 kg)  04/29/20 148 lb 6 oz (67.3 kg)  03/25/20 146 lb 9 oz (66.5 kg)     Orthostatic vital signs: Lying: 204/78, 71 bpm Sitting: 181/70, 69 bpm Standing: 148/76, 69  bpm Standing x3 minutes: 146/73, 70 bpm  ASSESSMENT & PLAN:   1. Dizziness: This appears to be a longstanding issue dating back at least to 02/2018 with prior MRI of the brain from 07/2018 indicating no stroke.  Previously noted to be orthostatic at times though not always.  No symptoms concerning for palpitations during these episodes.  Suspect her dizziness is not exclusively cardiac after discussing with her primary cardiologist.  Recommend follow-up with PCP with consideration for referral to neurology.  We did discuss obtaining a CT head to evaluate for CVA contributing to her dizziness.  However, she defers this at this time indicating she wants to help with some of her husband's medical issues first and she will contact us if she would like to have this scheduled.  2. CAD s/p CABG without angina: No symptoms concerning for angina.  Recent Lexiscan MPI showed no significant ischemia.  Continue secondary prevention with aspirin, Zetia, and Repatha.  No longer on beta-blocker or long-acting nitrate secondary to orthostatic hypotension.  3. HTN: Blood pressure has overall been well controlled at home and during triage in the office today.  However, she was noted to be hypertensive with lying and sitting with positive orthostatics when going from a lying to sitting and sitting to standing.  Check renal artery ultrasound to evaluate for RAS.  With well-controlled blood pressures at home we will continue to defer reinitiation of antihypertensive therapy.  Recommend she reduce Lasix to 20 mg daily and suspect that some of her lower extremity swelling is related to venous insufficiency versus dependent edema.  Recommend leg elevation and compression stockings.  Low-sodium diet.  4. HLD: LDL 45 from 01/2019 with normal LFT in 02/2020.  She remains on Zetia and Repatha.  Intolerant to statins.  5. Orthostatic hypotension: Patient significantly hypertensive when lying for orthostatic vital signs in the office  today with orthostatics being mildly positive.  That said, with significant supine hypertension we will defer addition of midodrine.  Schedule renal artery ultrasound as outlined above.  Consider compression stockings.  6. Carotid artery disease: Stable on recent CTA of the neck.  Followed by vascular surgery.  7. HFpEF: She appears euvolemic and well compensated.  Reduce Lasix to 20 mg daily as outlined above as there is suspicion that her bilateral ankle edema may be related to dependent edema/venous insufficiency.  Compression stockings recommended.  Unable to add spironolactone with history of orthostasis noted.  Check BMP.  Patient was discussed in detail with her primary cardiologist.  Disposition: F/u with Dr. Rockey Situ or an APP in 2 months.   Medication Adjustments/Labs and Tests Ordered: Current medicines are reviewed at length with the patient today.  Concerns regarding medicines are outlined above. Medication changes, Labs and Tests ordered today are summarized above and listed in the Patient Instructions accessible in Encounters.   Signed, Christell Faith, PA-C 05/29/2020 10:50 AM     [address] [place], [place] [address] [phone]

## 2020-05-29 ENCOUNTER — Encounter: Payer: Self-pay | Admitting: Physician Assistant

## 2020-05-29 ENCOUNTER — Ambulatory Visit: Payer: Medicare HMO | Admitting: Physician Assistant

## 2020-05-29 ENCOUNTER — Other Ambulatory Visit: Payer: Self-pay

## 2020-05-29 VITALS — BP 130/80 | Ht 62.0 in | Wt 147.0 lb

## 2020-05-29 DIAGNOSIS — R42 Dizziness and giddiness: Secondary | ICD-10-CM | POA: Diagnosis not present

## 2020-05-29 DIAGNOSIS — I6523 Occlusion and stenosis of bilateral carotid arteries: Secondary | ICD-10-CM

## 2020-05-29 DIAGNOSIS — I25118 Atherosclerotic heart disease of native coronary artery with other forms of angina pectoris: Secondary | ICD-10-CM

## 2020-05-29 DIAGNOSIS — I10 Essential (primary) hypertension: Secondary | ICD-10-CM | POA: Diagnosis not present

## 2020-05-29 DIAGNOSIS — I951 Orthostatic hypotension: Secondary | ICD-10-CM

## 2020-05-29 DIAGNOSIS — E785 Hyperlipidemia, unspecified: Secondary | ICD-10-CM

## 2020-05-29 DIAGNOSIS — I5032 Chronic diastolic (congestive) heart failure: Secondary | ICD-10-CM

## 2020-05-29 DIAGNOSIS — Z951 Presence of aortocoronary bypass graft: Secondary | ICD-10-CM | POA: Diagnosis not present

## 2020-05-29 MED ORDER — FUROSEMIDE 40 MG PO TABS
20.0000 mg | ORAL_TABLET | Freq: Every day | ORAL | 3 refills | Status: DC
Start: 2020-05-29 — End: 2021-01-14

## 2020-05-29 MED ORDER — POTASSIUM CHLORIDE ER 10 MEQ PO TBCR
10.0000 meq | EXTENDED_RELEASE_TABLET | Freq: Every day | ORAL | 5 refills | Status: DC
Start: 2020-05-29 — End: 2021-01-14

## 2020-05-29 NOTE — Patient Instructions (Signed)
Medication Instructions:   Your physician has recommended you make the following change in your medication:   1)  DECREASE your Lasix to 20mg  daily. 2)  DECREASE your Potassium to 73mEq daily.  *If you need a refill on your cardiac medications before your next appointment, please call your pharmacy*   Lab Work: BMET drawn today. If you have labs (blood work) drawn today and your tests are completely normal, you will receive your results only by: Marland Kitchen MyChart Message (if you have MyChart) OR . A paper copy in the mail If you have any lab test that is abnormal or we need to change your treatment, we will call you to review the results.   Testing/Procedures: Your physician has requested that you have a renal artery duplex. During this test, an ultrasound is used to evaluate blood flow to the kidneys. Allow one hour for this exam. Do not eat after midnight the day before and avoid carbonated beverages. Take your medications as you usually do.    Follow-Up: At [facility], you and your health needs are our priority.  As part of our continuing mission to provide you with exceptional heart care, we have created designated Provider Care Teams.  These Care Teams include your primary Cardiologist (physician) and Advanced Practice Providers (APPs -  Physician Assistants and Nurse Practitioners) who all work together to provide you with the care you need, when you need it.  We recommend signing up for the patient portal called "MyChart".  Sign up information is provided on this After Visit Summary.  MyChart is used to connect with patients for Virtual Visits (Telemedicine).  Patients are able to view lab/test results, encounter notes, upcoming appointments, etc.  Non-urgent messages can be sent to your provider as well.   To learn more about what you can do with MyChart, go to [email or web address].    Your next appointment:   2 month(s)  The format for your next appointment:   In  Person  Provider:   You may see Ida Rogue, MD or one of the following Advanced Practice Providers on your designated Care Team:     Christell Faith, [place]     Other Instructions

## 2020-05-30 LAB — BASIC METABOLIC PANEL
BUN/Creatinine Ratio: 16 (ref 12–28)
BUN: 14 mg/dL (ref 8–27)
CO2: 29 mmol/L (ref 20–29)
Calcium: 9.6 mg/dL (ref 8.7–10.3)
Chloride: 98 mmol/L (ref 96–106)
Creatinine, Ser: 0.89 mg/dL (ref 0.57–1.00)
GFR calc Af Amer: 71 mL/min/{1.73_m2} (ref >59)
GFR calc non Af Amer: 61 mL/min/{1.73_m2} (ref >59)
Glucose: 104 mg/dL — ABNORMAL HIGH (ref 65–99)
Potassium: 4 mmol/L (ref 3.5–5.2)
Sodium: 142 mmol/L (ref 134–144)

## 2020-06-17 ENCOUNTER — Ambulatory Visit (INDEPENDENT_AMBULATORY_CARE_PROVIDER_SITE_OTHER): Payer: Medicare HMO

## 2020-06-17 ENCOUNTER — Other Ambulatory Visit: Payer: Self-pay

## 2020-06-17 DIAGNOSIS — I10 Essential (primary) hypertension: Secondary | ICD-10-CM | POA: Diagnosis not present

## 2020-06-25 ENCOUNTER — Other Ambulatory Visit: Payer: Self-pay | Admitting: Family Medicine

## 2020-07-04 ENCOUNTER — Telehealth: Payer: Self-pay | Admitting: Cardiovascular Disease

## 2020-07-04 NOTE — Telephone Encounter (Signed)
Patient states her Repatha has run out from [employer], and she is unsure of what to do next. Please call to discuss.

## 2020-07-04 NOTE — Telephone Encounter (Signed)
Spoke with patient and reviewed that I did reach out to pharmacy and they ran without insurance and it would be $125.87 per month and if she can call for refill the Monday of the week that she is due it may be cheaper. She states that if it runs this price she can not afford it. Requested that she please call us back if she is not able to take. She then inquired if Dr. Rockey Situ would put her back on Crestor if not able to get Repatha and let her know that I would check with him. She was appreciative for the call with no further questions at this time.

## 2020-07-04 NOTE — Telephone Encounter (Signed)
Spoke with patient and she has one pen left for tomorrow. Advised that I would call her pharmacy to see what her cost will be and then also check to see if we should have any samples. She verbalized understanding with no further questions at this time.

## 2020-07-04 NOTE — Telephone Encounter (Signed)
Spoke with Judeen Hammans at pharmacy and she stated that cost now would be $125.87 and that the week she is due may cost less with insurance. Will pass this information on to patient and no further needs at this time.

## 2020-07-07 NOTE — Telephone Encounter (Signed)
Up to her, she had leg myalgias on crestor before in 2020 She would need zetia and crestor at least 10 mg daily  (previously on 20 mg and numbers were good)

## 2020-07-08 ENCOUNTER — Ambulatory Visit (INDEPENDENT_AMBULATORY_CARE_PROVIDER_SITE_OTHER): Payer: Medicare HMO | Admitting: Family Medicine

## 2020-07-08 ENCOUNTER — Other Ambulatory Visit: Payer: Self-pay

## 2020-07-08 ENCOUNTER — Encounter: Payer: Self-pay | Admitting: Family Medicine

## 2020-07-08 ENCOUNTER — Ambulatory Visit (INDEPENDENT_AMBULATORY_CARE_PROVIDER_SITE_OTHER)
Admission: RE | Admit: 2020-07-08 | Discharge: 2020-07-08 | Disposition: A | Discharge status: Home / Self Care | Payer: Medicare HMO | Source: Ambulatory Visit | Attending: Family Medicine | Admitting: Family Medicine

## 2020-07-08 VITALS — BP 114/72 | HR 69 | Temp 96.9°F | Wt 146.0 lb

## 2020-07-08 DIAGNOSIS — M545 Low back pain, unspecified: Secondary | ICD-10-CM

## 2020-07-08 DIAGNOSIS — Z23 Encounter for immunization: Secondary | ICD-10-CM | POA: Diagnosis not present

## 2020-07-08 MED ORDER — PREDNISONE 10 MG PO TABS
ORAL_TABLET | ORAL | 0 refills | Status: DC
Start: 2020-07-08 — End: 2020-09-13

## 2020-07-08 MED ORDER — ROSUVASTATIN CALCIUM 10 MG PO TABS
10.0000 mg | ORAL_TABLET | Freq: Every day | ORAL | 3 refills | Status: DC
Start: 2020-07-08 — End: 2020-09-13

## 2020-07-08 NOTE — Telephone Encounter (Signed)
Spoke with patient and reviewed provider recommendations. Sent in Crestor 10 mg daily and if she is able to pay for her Repatha then she will give [place] a call. She was appreciative for the call with no further questions at this time.

## 2020-07-08 NOTE — Progress Notes (Signed)
This visit occurred during the SARS-CoV-2 public health emergency.  Safety protocols were in place, including screening questions prior to the visit, additional usage of staff PPE, and extensive cleaning of exam room while observing appropriate contact time as indicated for disinfecting solutions.  B leg pain.  She had seen Dr. Annette Stable and Dr. Phebe Colla.  She has radicular pain, L>R.  She'll fall due to a combination of pain and weakness.  Weakness is present all of the time.  Sx are worse over the last few months.  She stopped gabapentin in the meantime, recently.  Pain wasn't better on gabapentin.  Pain is clearly better sitting down, worse walking.    Prev CT with  IMPRESSION: 1. Transitional anatomy as previously numbered. 2. Disc bulging and posterior element hypertrophy mildly narrows the spinal canal from L3-4 to L4-5. No convincing change compared to lumbar MRI in 2018. 3. Incidental bilateral pleural effusion which is minimally covered.  She started taking glucosamine and chondroitin.  She isn't SOB.   She had noted hair loss and derm eval.  He had lost weight w/o clear cause seen on prev labs.  She is admittedly worried about her husband, who needed heart valve replacement recently.  He is doing better now.  Discussed.  Meds, vitals, and allergies reviewed.   ROS: Per HPI unless specifically indicated in ROS section   nad ncat Neck supple, no LA rrr ctab Abd soft, not ttp. Ext w/o edema.  S/S wnl BLE Back not ttp while sitting.  Normal forward flexion at the waist while sitting w/o pain.

## 2020-07-08 NOTE — Patient Instructions (Signed)
Go to the lab on the way out.   If you have mychart we'll likely use that to update you.    Take prednisone 2 a day for 5 days, then 1 a day for 5 days, with food. Don't take with aleve/ibuprofen. We'll update Dr. Marchelle Folks clinic in the meantime.  I need his input.  Take care.  Glad to see you.

## 2020-07-10 NOTE — Assessment & Plan Note (Signed)
Strength and sensation are normal today at the office visit when she is sitting down.  She has pain suggestive of spinal stenosis with more pain when she is up walking.  Her pain gets better when she sitting down.  We talked about options at this point.  I need input from Dr. Annette Stable with the surgery clinic.  We talked about a brief prednisone taper in the meantime to see if that would make any difference.  Recheck plain films today.  See notes on imaging.  Still okay for outpatient follow-up.  She agrees plan.  At least 30 minutes were devoted to patient care in this encounter (this can potentially include time spent reviewing the patient's file/history, interviewing and examining the patient, counseling/reviewing plan with patient, ordering referrals, ordering tests, reviewing relevant laboratory or x-ray data, and documenting the encounter).

## 2020-07-24 NOTE — Progress Notes (Signed)
Cardiology Office Note    Date:  07/29/2020   ID:  Catherine Green, DOB 05-07-1941, MRN [id number]  PCP:  Tonia Ghent, MD  Cardiologist:  Ida Rogue, MD  Electrophysiologist:  None   Chief Complaint: Follow up  History of Present Illness:   Catherine Green is a 79 y.o. female with history of CAD status post three-vessel CABG in 2006, HFpEF, bilateral carotid artery disease status post bilateral CEA followed by vascular surgery, HTN, HLD,multiple medication intolerances,back pain status post surgery, prior tobacco use starting at age 15 andquitting at age 18 who presents for follow-up of chronic dizziness.  Most recent cardiac cath from 1/2009demonstrated an atretic LIMA to LAD with patent vein grafts.  She was seen at her PCPs office in 10/2019 with dyspnea. BNP mildly elevated at 300.Her symptoms werefelt to be likely multifactorial including volume overload and mild longstanding anemia. She was started on Lasix 40 mg daily. Echo in 11/2019 showed a hyperdynamic LVSF with an EF of 65 to 13%, grade 3 diastolic dysfunction, normal RV systolic function and RV cavity size, and trivial mitral regurgitation. Unable to estimate right heart pressures, IVC not documented as being dilated. Small pleural effusion was noted. Following this echo she was taking Lasix 40 mg twice daily with continued swelling. Recheck at her PCPs office in late 5/2021demonstrated her weight was down 6 pounds, however she continued to note bilateral pedal edema and abdominal bloating as the day progressed. She continued to have some occasional dyspnea in the afternoons. Her appetite remained down. She was seen in the office on 02/06/2020 noting increased dyspnea, PND, 2-3 pillow orthopnea, and increased fatigue. BP was elevated in the 244W to 102V systolic. Her weight was down 8 pounds in the past 3 months with notes indicating she was missing meals. She reported nausea when eating with associated  dysphagia. Repeat BNP mildly improved at 239 with stable renal function. She was taking Lasix 40 mg daily. It was felt there was some component of venous insufficiency with her lower extremity swelling. It was recommended she increase her Lasix to 40 mg twice daily for 1 week followed by 40 mg daily thereafter.  She was seen in 02/2020 noting improvement in her dyspnea following diuresis and had continued to take Lasix 40 mg twice daily since her visit in 01/2020.  Her weight is down another 6 pounds when compared to her last clinic visit.  She did note some positional dizziness and generalized fatigue.  She was orthostatic in the office that day.  Lasix was decreased to 40 mg daily.  Symptoms felt similar to her presentation leading up to her bypass.  In this setting, she underwent Lexiscan MPI on 03/15/2020 which showed no significant ischemia, normal wall motion, EF 76%, attenuation corrected images with aortic atherosclerosis and coronary artery calcifications, and was overall a low risk scan.  EKG showed PVCs.  CTA of the neck from 04/23/2020 showed no dissection or pseudoaneurysm without interval growth of remodeling/fusiform aneurysm within the right internal carotid artery, 30% left ICA stenosis, and 70% stenosis of the brachiocephalic origin.  There was 40% subclavian artery stenosis.  She was seen in the office on 04/29/2020 noting her dizziness persisted.  Her weight was stable.  She had further decreased her Lasix to 20 mg daily in the setting of ongoing dizziness.  She denied any shortness of breath or lower extremity swelling.  Orthostatic vital signs remained positive at that visit.  Imdur was discontinued and carvedilol was  decreased to 3.125 mg twice daily.    Following this she contacted our office in 05/2020, noting continued orthostasis, and in this setting carvedilol was held.  She was last seen in the office on 05/29/2020, and continued to note positional dizziness.  She was no longer on  carvedilol or Imdur though was taking Lasix 40 mg daily.  Home BP readings were predominantly in the 096E to 454U systolic.  Her dizziness was noted to date back at least to 02/2018.  She declined head CT.  She was advised to follow-up with her PCP.  It was again recommended her Lasix be decreased to 20 mg daily.  She was hypertensive when lying for orthostatic BP with orthostatics continuing to be mildly positive.  Renal artery ultrasound showed no evidence of renal artery stenosis.  She comes in today noting her dizziness does appear to be somewhat improved.  She continues to note fluctuations in her blood pressure with home readings ranging from 126/72 170/74 with most heart rates in the 60s bpm.  She also continues to note bilateral lower extremity weakness without falls.  No symptoms of claudication.  She denies any chest pain or dyspnea.  No palpitations.  She is tolerating Lasix 20 mg daily.  Labs independently reviewed: 05/2020 - BUN 14, serum creatinine 0.89, potassium 4.0 02/2020 - Hgb 12.2, PLT 364 12/2019 - albumin 4.4, AST/ALT normal 10/2019-A1c 5.8 01/2019-TC 125, TG 109, HDL 58, LDL 45 03/2018-TSH normal   Past Medical History:  Diagnosis Date  . Allergy    hay fever  . Anemia   . Anxiety   . Back pain   . Carotid artery occlusion   . Cerebrovascular disease    extracranial; occlusive  . Chicken pox   . Coronary artery disease   . Depression   . Dizziness   . Dizziness   . DVT (deep venous thrombosis) ([place])   . Fainting spell   . GERD (gastroesophageal reflux disease)   . Headache   . Heart murmur   . Hyperlipidemia   . Hypertension   . Leg pain   . PONV (postoperative nausea and vomiting)    severe nausea and vomiting  . Pre-syncope   . PVD (peripheral vascular disease) ([place])    endarterectomy by [name]  . Seasonal allergies   . Shortness of breath dyspnea    wth ambulation at times  . Swelling of both ankles    and abdomen; takes Lasix when needed  .  Thrombophlebitis    following childbirth  . Ulcer     Past Surgical History:  Procedure Laterality Date  . APPENDECTOMY    . BACK SURGERY  01/14/16  . BREAST SURGERY     saline implants  . CARDIAC CATHETERIZATION    . CAROTID ENDARTERECTOMY  1992  . CAROTID ENDARTERECTOMY Right February 13, 2005   Re-do Right CE  . CATARACT EXTRACTION, BILATERAL  2013  . CHOLECYSTECTOMY    . CHOLECYSTECTOMY, LAPAROSCOPIC  04/02/14   Dr. Rochel Brome  . CORONARY ARTERY BYPASS GRAFT  2006   x3 Dr. Prescott Gum  . ENDARTERECTOMY Left 07/20/2014   Procedure: ENDARTERECTOMY CAROTID-LEFT;  Surgeon: Rosetta Posner, MD;  Location: [facility];  Service: Vascular;  Laterality: Left;  . EYE SURGERY Bilateral Feb. 2013   Cataract Left eye  . SEPTOPLASTY     with bilateral inferior turbinate reductions  . SPINE SURGERY  12/2015  . TOTAL ABDOMINAL HYSTERECTOMY    . UPPER GASTROINTESTINAL ENDOSCOPY  had polyps removed from esophagus  . UPPER GI ENDOSCOPY      Current Medications: Current Meds  Medication Sig  . aspirin 81 MG tablet Take 1 tablet (81 mg total) by mouth daily.  . Coenzyme Q10 (CO Q10) 100 MG CAPS Take 100 mg by mouth daily.  Marland Kitchen ezetimibe (ZETIA) 10 MG tablet TAKE ONE TABLET EVERY DAY  . fluticasone (FLONASE) 50 MCG/ACT nasal spray Place 1 spray into both nostrils daily.  . furosemide (LASIX) 40 MG tablet Take 0.5 tablets (20 mg total) by mouth daily.  Marland Kitchen loratadine (CLARITIN) 10 MG tablet Take 10 mg by mouth daily.  Marland Kitchen omeprazole (PRILOSEC) 20 MG capsule Take 1 capsule (20 mg total) by mouth daily.  . potassium chloride (KLOR-CON) 10 MEQ tablet Take 1 tablet (10 mEq total) by mouth daily.  . predniSONE (DELTASONE) 10 MG tablet Take 2 a day for 5 days, then 1 a day for 5 days, with food. Don't take with aleve/ibuprofen.  . rosuvastatin (CRESTOR) 10 MG tablet Take 1 tablet (10 mg total) by mouth daily.  . sertraline (ZOLOFT) 100 MG tablet TAKE ONE TABLET BY MOUTH EVERY DAY  . vitamin B-12  (CYANOCOBALAMIN) 1000 MCG tablet Take 1 tablet (1,000 mcg total) by mouth daily.    Allergies:   Lyrica [pregabalin], Hydrocodone, Ivp dye [iodinated diagnostic agents], Lipitor [atorvastatin], Oxycodone hcl, Penicillin g, Shellfish allergy, Prednisone, Augmentin [amoxicillin-pot clavulanate], Codeine, Crestor [rosuvastatin calcium], Cymbalta [duloxetine hcl], Demerol, Doxycycline, Effexor [venlafaxine], Hydrochlorothiazide, Hydroxyzine, Metoprolol, Protonix [pantoprazole sodium], and Trazodone and nefazodone   Social History   Socioeconomic History  . Marital status: Married    Spouse name: Jeneen Rinks   . Number of children: 1  . Years of education: Not on file  . Highest education level: 11th grade  Occupational History    Comment: Retired   Tobacco Use  . Smoking status: Former Smoker    Packs/day: 0.30    Years: 15.00    Pack years: 4.50    Types: Cigarettes    Quit date: 08/31/1996    Years since quitting: 23.9  . Smokeless tobacco: Never Used  Vaping Use  . Vaping Use: Never used  Substance and Sexual Activity  . Alcohol use: No    Alcohol/week: 0.0 standard drinks  . Drug use: No  . Sexual activity: Not Currently  Other Topics Concern  . Not on file  Social History Narrative   Lives with husband   Work - retired Emergency planning/management officer   Diet - healthy   Right handed    Caffeine- 1 cup per day.   Social Determinants of Health   Financial Resource Strain:   . Difficulty of Paying Living Expenses: Not on file  Food Insecurity:   . Worried About Charity fundraiser in the Last Year: Not on file  . Ran Out of Food in the Last Year: Not on file  Transportation Needs:   . Lack of Transportation (Medical): Not on file  . Lack of Transportation (Non-Medical): Not on file  Physical Activity:   . Days of Exercise per Week: Not on file  . Minutes of Exercise per Session: Not on file  Stress:   . Feeling of Stress : Not on file  Social Connections:   . Frequency of Communication with  Friends and Family: Not on file  . Frequency of Social Gatherings with Friends and Family: Not on file  . Attends Religious Services: Not on file  . Active Member of Clubs or Organizations: Not  on file  . Attends Archivist Meetings: Not on file  . Marital Status: Not on file     Family History:  The patient's family history includes Aneurysm in her brother and mother; Birth defects in her brother; Bleeding Disorder in her mother and sister; Cancer in her sister; Cirrhosis in her father; Deep vein thrombosis in her brother; Heart attack in her father; Heart disease in her brother, father, mother, and sister; Hyperlipidemia in her brother, daughter, father, mother, and sister; Hypertension in her brother, daughter, father, mother, sister, and another family member; Peripheral vascular disease in her daughter; Varicose Veins in her mother and sister. There is no history of Breast cancer or Colon cancer.  ROS:   Review of Systems  Constitutional: Positive for malaise/fatigue. Negative for chills, diaphoresis, fever and weight loss.  HENT: Negative for congestion.   Eyes: Negative for discharge and redness.  Respiratory: Negative for cough, sputum production, shortness of breath and wheezing.   Cardiovascular: Negative for chest pain, palpitations, orthopnea, claudication, leg swelling and PND.  Gastrointestinal: Negative for abdominal pain, heartburn, nausea and vomiting.  Musculoskeletal: Negative for falls and myalgias.  Skin: Negative for rash.  Neurological: Positive for dizziness, weakness and headaches. Negative for tingling, tremors, sensory change, speech change, focal weakness and loss of consciousness.  Endo/Heme/Allergies: Does not bruise/bleed easily.  Psychiatric/Behavioral: Negative for substance abuse. The patient is not nervous/anxious.   All other systems reviewed and are negative.    EKGs/Labs/Other Studies Reviewed:    Studies reviewed were summarized above.  The additional studies were reviewed today:  2D echo 11/2019: 1. Left ventricular ejection fraction, by estimation, is 65 to 70%. The  left ventricle has normal function. Left ventricular endocardial border  not optimally defined to evaluate regional wall motion. Left ventricular  diastolic parameters are consistent  with Grade III diastolic dysfunction (restrictive). Elevated left atrial  pressure.  2. Right ventricular systolic function is normal. The right ventricular  size is normal. Tricuspid regurgitation signal is inadequate for assessing  PA pressure.  3. The mitral valve is grossly normal. Trivial mitral valve  regurgitation.  4. The aortic valve was not well visualized. Aortic valve regurgitation  is not visualized. No aortic stenosis is present.  5. The inferior vena cava is normal in size with greater than 50%  respiratory variability, suggesting right atrial pressure of 3 mmHg. __________  Carlton Adam MPI 02/2020: Pharmacological myocardial perfusion imaging study with no significant ischemia Normal wall motion, EF estimated at 76% No EKG changes concerning for ischemia at peak stress or in recovery. Resting EKG with PVCs Attenuation correction images with aortic atherosclerosis and coronary calcification Low risk scan   EKG:  EKG is ordered today.  The EKG ordered today demonstrates NSR with sinus arrhythmia, 64 bpm, cannot exclude possible prior septal infarct, no acute ST-T changes  Recent Labs: 01/22/2020: ALT 10; Pro B Natriuretic peptide (BNP) 239.0 03/07/2020: Hemoglobin 12.2; Platelets 364 05/29/2020: BUN 14; Creatinine, Ser 0.89; Potassium 4.0; Sodium 142  Recent Lipid Panel    Component Value Date/Time   CHOL 125 02/01/2019 0907   CHOL 136 06/29/2016 0945   TRIG 109.0 02/01/2019 0907   HDL 58.40 02/01/2019 0907   HDL 57 06/29/2016 0945   CHOLHDL 2 02/01/2019 0907   VLDL 21.8 02/01/2019 0907   LDLCALC 45 02/01/2019 0907   LDLCALC 55 06/29/2016 0945    LDLDIRECT 139.1 04/11/2012 1131    PHYSICAL EXAM:    VS:  BP 130/78 (BP Location: Right  Arm, Patient Position: Sitting, Cuff Size: Normal)   Pulse 64   Ht 5\' 2"  (1.575 m)   Wt 149 lb (67.6 kg)   BMI 27.25 kg/m   BMI: Body mass index is 27.25 kg/m.  Physical Exam Constitutional:      Appearance: She is well-developed.  HENT:     Head: Normocephalic and atraumatic.  Eyes:     General:        Right eye: No discharge.        Left eye: No discharge.  Neck:     Vascular: No JVD.  Cardiovascular:     Rate and Rhythm: Normal rate and regular rhythm.     Pulses: No midsystolic click and no opening snap.          Carotid pulses are on the right side with bruit and on the left side with bruit.      Posterior tibial pulses are 2+ on the right side and 2+ on the left side.     Heart sounds: Normal heart sounds, S1 normal and S2 normal. Heart sounds not distant. No murmur heard.  No friction rub.  Pulmonary:     Effort: Pulmonary effort is normal. No respiratory distress.     Breath sounds: Normal breath sounds. No decreased breath sounds, wheezing or rales.  Chest:     Chest wall: No tenderness.  Abdominal:     General: There is no distension.     Palpations: Abdomen is soft.     Tenderness: There is no abdominal tenderness.  Musculoskeletal:     Cervical back: Normal range of motion.  Skin:    General: Skin is warm and dry.     Nails: There is no clubbing.  Neurological:     Mental Status: She is alert and oriented to person, place, and time.  Psychiatric:        Speech: Speech normal.        Behavior: Behavior normal.        Thought Content: Thought content normal.        Judgment: Judgment normal.     Wt Readings from Last 3 Encounters:  07/29/20 149 lb (67.6 kg)  07/08/20 146 lb (66.2 kg)  05/29/20 147 lb (66.7 kg)      ASSESSMENT & PLAN:   1. CAD status post CABG without angina: No symptoms concerning for angina.  Recent Lexiscan MPI showed no significant  ischemia.  Continue secondary prevention with aspirin, Zetia, and Repatha.  No longer on beta-blocker or long-acting nitrate secondary to orthostatic hypotension.  No indication for further ischemic testing at this time.  2. Dizziness/fatigue/orthostatic hypotension: This is a longstanding issue and dates back at least to 02/2018.  Prior MRI of the brain from 07/2018, performed for hearing loss, showed no evidence of CVA with chronic microvascular ischemic changes noted.  Symptoms are possibly exacerbated by orthostasis.  No symptoms of palpitations during her dizzy episodes.  Case has previously been discussed with her primary cardiologist with dizziness felt to not be exclusively cardiac in etiology with recommendation for patient to follow-up with PCP and consideration for referral to neurology.  She prefers PCP coordinate this if indicated.  She has previously declined head CT.  Check CBC, BMP, and TSH.  Recommend compression stockings.  3. HTN: Blood pressure is well controlled in the office today.  Renal artery ultrasound negative for RAS.  Suspect we will need to allow for some degree of permissive hypertension.  With discontinuation of carvedilol  and Imdur along with a tapered dose of Lasix she is now seeing some higher BP readings at home which concerned her.  Recommend she take carvedilol 3.125 mg twice daily as needed systolic blood pressure greater than 150 mmHg.  Otherwise, she will continue Lasix 20 mg daily as outlined below.  4. HLD: LDL of 45 from 01/2019 with normal LFT in 02/2020.  She remains on Zetia and Repatha.  She is intolerant to statins.  5. Carotid artery disease: Stable on recent CTA of the neck.  Followed by vascular surgery.  She has an appointment with vascular surgery early next month.  6. HFpEF: She appears euvolemic and well compensated with a stable weight.  Continue Lasix 20 mg daily.  Historically we have been unable to add spironolactone with history of orthostasis  noted.  Check BMP.  Disposition: F/u with Dr. Rockey Situ or an APP in 3 months.   Medication Adjustments/Labs and Tests Ordered: Current medicines are reviewed at length with the patient today.  Concerns regarding medicines are outlined above. Medication changes, Labs and Tests ordered today are summarized above and listed in the Patient Instructions accessible in Encounters.   Signed, Christell Faith, PA-C 07/29/2020 11:17 AM     [facility] [place], [place] [address] [phone]

## 2020-07-29 ENCOUNTER — Ambulatory Visit: Payer: Medicare HMO | Admitting: Physician Assistant

## 2020-07-29 ENCOUNTER — Encounter: Payer: Self-pay | Admitting: Physician Assistant

## 2020-07-29 ENCOUNTER — Other Ambulatory Visit: Payer: Self-pay

## 2020-07-29 VITALS — BP 130/78 | HR 64 | Ht 62.0 in | Wt 149.0 lb

## 2020-07-29 DIAGNOSIS — I6523 Occlusion and stenosis of bilateral carotid arteries: Secondary | ICD-10-CM

## 2020-07-29 DIAGNOSIS — R5383 Other fatigue: Secondary | ICD-10-CM

## 2020-07-29 DIAGNOSIS — I10 Essential (primary) hypertension: Secondary | ICD-10-CM

## 2020-07-29 DIAGNOSIS — Z951 Presence of aortocoronary bypass graft: Secondary | ICD-10-CM | POA: Diagnosis not present

## 2020-07-29 DIAGNOSIS — I951 Orthostatic hypotension: Secondary | ICD-10-CM | POA: Diagnosis not present

## 2020-07-29 DIAGNOSIS — R42 Dizziness and giddiness: Secondary | ICD-10-CM | POA: Diagnosis not present

## 2020-07-29 DIAGNOSIS — I2510 Atherosclerotic heart disease of native coronary artery without angina pectoris: Secondary | ICD-10-CM

## 2020-07-29 DIAGNOSIS — E785 Hyperlipidemia, unspecified: Secondary | ICD-10-CM

## 2020-07-29 DIAGNOSIS — I5032 Chronic diastolic (congestive) heart failure: Secondary | ICD-10-CM

## 2020-07-29 MED ORDER — CARVEDILOL 3.125 MG PO TABS
3.1250 mg | ORAL_TABLET | Freq: Two times a day (BID) | ORAL | Status: DC | PRN
Start: 2020-07-29 — End: 2020-11-21

## 2020-07-29 NOTE — Patient Instructions (Signed)
Medication Instructions:  Your physician has recommended you make the following change in your medication:   START Carvedilol (Coreg) 3.125mg  TWICE daily AS NEEDED for systolic blood pressure (top number) greater than 150  *If you need a refill on your cardiac medications before your next appointment, please call your pharmacy*   Lab Work:  Your physician recommends that you have lab work TODAY: CBC, Bmet, TSH  If you have labs (blood work) drawn today and your tests are completely normal, you will receive your results only by: Marland Kitchen MyChart Message (if you have MyChart) OR . A paper copy in the mail If you have any lab test that is abnormal or we need to change your treatment, we will call you to review the results.   Testing/Procedures: None ordered   Follow-Up: At [facility], you and your health needs are our priority.  As part of our continuing mission to provide you with exceptional heart care, we have created designated Provider Care Teams.  These Care Teams include your primary Cardiologist (physician) and Advanced Practice Providers (APPs -  Physician Assistants and Nurse Practitioners) who all work together to provide you with the care you need, when you need it.  We recommend signing up for the patient portal called "MyChart".  Sign up information is provided on this After Visit Summary.  MyChart is used to connect with patients for Virtual Visits (Telemedicine).  Patients are able to view lab/test results, encounter notes, upcoming appointments, etc.  Non-urgent messages can be sent to your provider as well.   To learn more about what you can do with MyChart, go to [email or web address].    Your next appointment:   3 month(s)  The format for your next appointment:   In Person  Provider:   You may see Ida Rogue, MD or one of the following Advanced Practice Providers on your designated Care Team:    Murray Hodgkins, NP  Christell Faith, PA-C  Marrianne Mood,  PA-C  Cadence [place], [place]  Laurann Montana, NP

## 2020-07-30 LAB — CBC
Hematocrit: 38.5 % (ref 34.0–46.6)
Hemoglobin: 12.6 g/dL (ref 11.1–15.9)
MCH: 26.9 pg (ref 26.6–33.0)
MCHC: 32.7 g/dL (ref 31.5–35.7)
MCV: 82 fL (ref 79–97)
Platelets: 318 10*3/uL (ref 150–450)
RBC: 4.68 x10E6/uL (ref 3.77–5.28)
RDW: 13.4 % (ref 11.7–15.4)
WBC: 6.5 10*3/uL (ref 3.4–10.8)

## 2020-07-30 LAB — BASIC METABOLIC PANEL
BUN/Creatinine Ratio: 16 (ref 12–28)
BUN: 14 mg/dL (ref 8–27)
CO2: 25 mmol/L (ref 20–29)
Calcium: 9.3 mg/dL (ref 8.7–10.3)
Chloride: 101 mmol/L (ref 96–106)
Creatinine, Ser: 0.87 mg/dL (ref 0.57–1.00)
GFR calc Af Amer: 73 mL/min/{1.73_m2} (ref >59)
GFR calc non Af Amer: 63 mL/min/{1.73_m2} (ref >59)
Glucose: 96 mg/dL (ref 65–99)
Potassium: 4.6 mmol/L (ref 3.5–5.2)
Sodium: 142 mmol/L (ref 134–144)

## 2020-07-30 LAB — TSH: TSH: 1.15 u[IU]/mL (ref 0.450–4.500)

## 2020-08-01 ENCOUNTER — Other Ambulatory Visit: Payer: Self-pay

## 2020-08-01 DIAGNOSIS — I6523 Occlusion and stenosis of bilateral carotid arteries: Secondary | ICD-10-CM

## 2020-08-06 DIAGNOSIS — H903 Sensorineural hearing loss, bilateral: Secondary | ICD-10-CM | POA: Diagnosis not present

## 2020-08-06 DIAGNOSIS — H6123 Impacted cerumen, bilateral: Secondary | ICD-10-CM | POA: Diagnosis not present

## 2020-08-08 ENCOUNTER — Ambulatory Visit (INDEPENDENT_AMBULATORY_CARE_PROVIDER_SITE_OTHER): Payer: Medicare HMO | Admitting: Vascular Surgery

## 2020-08-08 ENCOUNTER — Ambulatory Visit (HOSPITAL_COMMUNITY)
Admission: RE | Admit: 2020-08-08 | Discharge: 2020-08-08 | Disposition: A | Discharge status: Home / Self Care | Payer: Medicare HMO | Source: Ambulatory Visit | Attending: Vascular Surgery | Admitting: Vascular Surgery

## 2020-08-08 ENCOUNTER — Encounter: Payer: Self-pay | Admitting: Vascular Surgery

## 2020-08-08 ENCOUNTER — Other Ambulatory Visit: Payer: Self-pay

## 2020-08-08 VITALS — BP 183/73 | HR 67 | Temp 98.2°F | Resp 20 | Ht 62.0 in | Wt 142.0 lb

## 2020-08-08 DIAGNOSIS — I6523 Occlusion and stenosis of bilateral carotid arteries: Secondary | ICD-10-CM | POA: Diagnosis not present

## 2020-08-08 NOTE — Progress Notes (Signed)
Patient is an 79 year old female who returns for follow-up today.  She previously underwent right carotid endarterectomy in 1992.  She had a redo procedure in 2016 for what sounds like a patch of pseudoaneurysm.  She then subsequently had a left carotid endarterectomy in 2015.  All of these were previously done by [name].  She has no symptoms of TIA amaurosis or stroke.  She has had some difficulty regulating her blood pressure and her primary care physician is adjusting this with her medications.  She has also had some difficulty adjusting her cholesterol but they are also still working on this.  She has had no symptoms of TIA amaurosis or stroke.  She is on a statin and aspirin.  Review of systems: She has some problems with her back and her legs occasionally give way.  She does not really complain of shortness of breath or chest pain.    Current Outpatient Medications on File Prior to Visit  Medication Sig Dispense Refill  . aspirin 81 MG tablet Take 1 tablet (81 mg total) by mouth daily. 30 tablet   . Coenzyme Q10 (CO Q10) 100 MG CAPS Take 100 mg by mouth daily.    Marland Kitchen ezetimibe (ZETIA) 10 MG tablet TAKE ONE TABLET EVERY DAY 90 tablet 3  . fluticasone (FLONASE) 50 MCG/ACT nasal spray Place 1 spray into both nostrils daily.    . furosemide (LASIX) 40 MG tablet Take 0.5 tablets (20 mg total) by mouth daily. 30 tablet 3  . loratadine (CLARITIN) 10 MG tablet Take 10 mg by mouth daily.    Marland Kitchen omeprazole (PRILOSEC) 20 MG capsule Take 1 capsule (20 mg total) by mouth daily. 30 capsule 3  . potassium chloride (KLOR-CON) 10 MEQ tablet Take 1 tablet (10 mEq total) by mouth daily. 30 tablet 5  . predniSONE (DELTASONE) 10 MG tablet Take 2 a day for 5 days, then 1 a day for 5 days, with food. Don't take with aleve/ibuprofen. 15 tablet 0  . rosuvastatin (CRESTOR) 10 MG tablet Take 1 tablet (10 mg total) by mouth daily. 90 tablet 3  . sertraline (ZOLOFT) 100 MG tablet TAKE ONE TABLET BY MOUTH EVERY DAY 90 tablet  1  . vitamin B-12 (CYANOCOBALAMIN) 1000 MCG tablet Take 1 tablet (1,000 mcg total) by mouth daily.    . carvedilol (COREG) 3.125 MG tablet Take 1 tablet (3.125 mg total) by mouth 2 (two) times daily as needed (systolic blood pressure greater than 150). (Patient not taking: Reported on 08/08/2020)     No current facility-administered medications on file prior to visit.    Past Medical History:  Diagnosis Date  . Allergy    hay fever  . Anemia   . Anxiety   . Back pain   . Carotid artery occlusion   . Cerebrovascular disease    extracranial; occlusive  . Chicken pox   . Coronary artery disease   . Depression   . Dizziness   . Dizziness   . DVT (deep venous thrombosis) ([place])   . Fainting spell   . GERD (gastroesophageal reflux disease)   . Headache   . Heart murmur   . Hyperlipidemia   . Hypertension   . Leg pain   . PONV (postoperative nausea and vomiting)    severe nausea and vomiting  . Pre-syncope   . PVD (peripheral vascular disease) ([place])    endarterectomy by [name]  . Seasonal allergies   . Shortness of breath dyspnea    wth ambulation  at times  . Swelling of both ankles    and abdomen; takes Lasix when needed  . Thrombophlebitis    following childbirth  . Ulcer     Past Surgical History:  Procedure Laterality Date  . APPENDECTOMY    . BACK SURGERY  01/14/16  . BREAST SURGERY     saline implants  . CARDIAC CATHETERIZATION    . CAROTID ENDARTERECTOMY  1992  . CAROTID ENDARTERECTOMY Right February 13, 2005   Re-do Right CE  . CATARACT EXTRACTION, BILATERAL  2013  . CHOLECYSTECTOMY    . CHOLECYSTECTOMY, LAPAROSCOPIC  04/02/14   Dr. Rochel Brome  . CORONARY ARTERY BYPASS GRAFT  2006   x3 Dr. Prescott Gum  . ENDARTERECTOMY Left 07/20/2014   Procedure: ENDARTERECTOMY CAROTID-LEFT;  Surgeon: Rosetta Posner, MD;  Location: [facility];  Service: Vascular;  Laterality: Left;  . EYE SURGERY Bilateral Feb. 2013   Cataract Left eye  . SEPTOPLASTY     with bilateral inferior  turbinate reductions  . SPINE SURGERY  12/2015  . TOTAL ABDOMINAL HYSTERECTOMY    . UPPER GASTROINTESTINAL ENDOSCOPY     had polyps removed from esophagus  . UPPER GI ENDOSCOPY      Physical exam:   Vitals:   08/08/20 1229 08/08/20 1231  BP: (!) 186/74 (!) 183/73  Pulse: 67   Resp: 20   Temp: 98.2 F (36.8 C)   SpO2: 97%   Weight: 142 lb (64.4 kg)   Height: 5\' 2"  (1.575 m)     Neck: Well-healed carotid scars 2+ carotid pulses  Neuro: Symmetric upper extremity lower extremity motor strength 5/5 no facial asymmetry  Data: Patient had a carotid duplex ultrasound today which showed no significant stenosis on either side with antegrade vertebral flow  Assessment: Doing well status post bilateral carotid endarterectomy no recurrent stenosis.  Plan: Patient will follow up in our APP clinic in 1 year with repeat carotid duplex exam.  We will continue her aspirin and statin.  Ruta Hinds, MD [facility] Office: [phone]

## 2020-09-04 ENCOUNTER — Telehealth: Payer: Self-pay | Admitting: Cardiovascular Disease

## 2020-09-04 NOTE — Telephone Encounter (Signed)
Pt c/o medication issue:  1. Name of Medication: rosuvastatin (CRESTOR)  2. How are you currently taking this medication (dosage and times per day)? 10 MG 1 tablet daily   3. Are you having a reaction (difficulty breathing--STAT)? Itching, headaches  4. What is your medication issue? Patient calling, states that she no longer wants to take this medication.  Would like to know if she can restart Repatha - send to [facility] in [place] on [address]

## 2020-09-08 NOTE — Telephone Encounter (Signed)
Can we ask when she was on on the Repatha before?  why she quit?   was it cost or side effects

## 2020-09-09 ENCOUNTER — Other Ambulatory Visit: Payer: Self-pay | Admitting: Cardiovascular Disease

## 2020-09-09 NOTE — Telephone Encounter (Signed)
Was able to speak with pt, pt reports Crestor is causing severe cramping and itching, wants to start Repatha back, reports she stop taking the Repatha d/t cost and assistance plan was denied, but was able to file a disclaimer and now is approved by [employer]. Would like new prescription sent to [facility] in [place], routed to Dr. Rockey Situ for order. Advised pt will call her back once order is obtain and sent to pharmacy.

## 2020-09-09 NOTE — Telephone Encounter (Signed)
Patient returning call .  She states she cannot tolerate Crestor due to cramping.

## 2020-09-09 NOTE — Telephone Encounter (Signed)
Left message to call back regarding wanting to change medication from Crestor back to Repatha, needing more information as to why she is wanting to switch back.

## 2020-09-13 ENCOUNTER — Ambulatory Visit (INDEPENDENT_AMBULATORY_CARE_PROVIDER_SITE_OTHER): Payer: Medicare HMO | Admitting: Family Medicine

## 2020-09-13 ENCOUNTER — Encounter: Payer: Self-pay | Admitting: Family Medicine

## 2020-09-13 ENCOUNTER — Other Ambulatory Visit: Payer: Self-pay

## 2020-09-13 VITALS — BP 126/82 | HR 70 | Temp 98.3°F | Ht 62.0 in | Wt 145.0 lb

## 2020-09-13 DIAGNOSIS — M545 Low back pain, unspecified: Secondary | ICD-10-CM | POA: Diagnosis not present

## 2020-09-13 DIAGNOSIS — E782 Mixed hyperlipidemia: Secondary | ICD-10-CM

## 2020-09-13 MED ORDER — TRAMADOL HCL 50 MG PO TABS: 25.0000 mg | ORAL_TABLET | Freq: Three times a day (TID) | ORAL | 0 refills | Status: DC | PRN

## 2020-09-13 NOTE — Patient Instructions (Addendum)
Let me see if I can get the repatha sent in and I'll update cardiology.   Try taking 1/2 tab of tramadol and see if that helps with the pain.  Sedation caution.   Let me see about options to get a second opinion for your back.   Take care.  Glad to see you.

## 2020-09-13 NOTE — Progress Notes (Signed)
This visit occurred during the SARS-CoV-2 public health emergency.  Safety protocols were in place, including screening questions prior to the visit, additional usage of staff PPE, and extensive cleaning of exam room while observing appropriate contact time as indicated for disinfecting solutions.  Back and leg sx.  When her back hurts, it radiates down her legs.  She had prev injections. She reportedly wasn't a candidate for surgery.  Pain is clearly worse standing, less when sitting.  Shocking pain in the lower back.  She can fall from the pain ("when my legs go out").  No syncope.  Prednisone didn't help her back.  Gabapentin didn't help prev when she took 400mg  mult times per day.   She has tried multiple medications for her back and we had a detailed conversation about her previous relative intolerance to those medications.  She tried crestor through cardiology.  She couldn't tolerate it but got approval for repatha.  She needs new rx for repatha.  Still on zetia.    Meds, vitals, and allergies reviewed.   ROS: Per HPI unless specifically indicated in ROS section   GEN: nad, alert and oriented HEENT: ncat NECK: supple w/o LA CV: rrr.  PULM: ctab, no inc wob ABD: soft, +bs EXT: no edema SKIN: no acute rash  31 minutes were devoted to patient care in this encounter (this includes time spent reviewing the patient's file/history, interviewing and examining the patient, counseling/reviewing plan with patient).

## 2020-09-16 NOTE — Assessment & Plan Note (Signed)
She brought a letter from her insurance company stating that she was approved for coverage on [place].  Continue Zetia.  I will send prescription for Repatha and I will update cardiology.  Thank all of all.

## 2020-09-17 MED ORDER — REPATHA SURECLICK 140 MG/ML ~~LOC~~ SOAJ: 140.0000 mg | SUBCUTANEOUS | 12 refills | Status: DC

## 2020-09-17 NOTE — Assessment & Plan Note (Signed)
We talked about medication options.  She still having a lot of pain.  We talked about trying to find the safest medication that she might be able to tolerate that could provide some relief and it appears the tramadol is a reasonable option.  She can start with 25 mg and see if she tolerates that and then gradually increase the dose if needed/tolerated.  We talked about trying to get a second opinion for her back and I will work on that in the meantime.  At this point still okay for outpatient follow-up and she agrees with plan.

## 2020-09-18 ENCOUNTER — Telehealth: Payer: Self-pay | Admitting: Family Medicine

## 2020-09-18 NOTE — Telephone Encounter (Signed)
-----   Message from Rise Mu, PA-C sent at 09/18/2020  2:39 PM EST ----- Regarding: RE: Repatha and lipid follow-up Hey Dr. Damita Dunnings,   I would be happy to follow up on her labs. Thanks so much for going ahead and getting her started on Repatha.   Take care,   Ryan ----- Message ----- From: Tonia Ghent, MD Sent: 09/17/2020  12:03 AM EST To: Rise Mu, PA-C Subject: Repatha and lipid follow-up                    I saw this patient recently.  She had a letter about approval for Repatha.  I took the liberty of sending the prescription so she can get started.  She is off statins because of intolerance.  She continued Zetia.  She has follow-up with you in March.  Can you check her lipids then?  It would save her a trip to my clinic.  I really appreciate your help.  Thank you.  Catherine Green

## 2020-09-18 NOTE — Telephone Encounter (Signed)
And I appreciate help from cardiology.

## 2020-09-27 ENCOUNTER — Other Ambulatory Visit: Payer: Self-pay | Admitting: Family Medicine

## 2020-09-27 NOTE — Telephone Encounter (Signed)
Refill request for Tramadol 50 mg tabs  LOV - 09/13/20 Next OV - none Last refill - 09/13/20 #21/0

## 2020-09-29 NOTE — Telephone Encounter (Signed)
Sent. Thanks.  Did she get some pain relief with tramadol?  Please let me know.

## 2020-09-30 NOTE — Telephone Encounter (Signed)
Spoke with patients husband; okay per DPR. Patient is getting relief from the tramadol; seems to be really helping her.

## 2020-09-30 NOTE — Telephone Encounter (Signed)
pts husband left v/m(DPR signed) that tramadol was not at [facility]. I spoke with Kennyth Lose at [facility] and tramadol refill was received, filled and has already been picked up. I spoke with Mr Tenaglia and he said he had the tramadol and nothing further needed but appreciated call.

## 2020-10-01 NOTE — Telephone Encounter (Signed)
Noted. Thanks.

## 2020-10-16 DIAGNOSIS — M961 Postlaminectomy syndrome, not elsewhere classified: Secondary | ICD-10-CM | POA: Diagnosis not present

## 2020-10-16 DIAGNOSIS — I739 Peripheral vascular disease, unspecified: Secondary | ICD-10-CM | POA: Diagnosis not present

## 2020-10-23 ENCOUNTER — Other Ambulatory Visit: Payer: Self-pay | Admitting: Neurosurgery

## 2020-10-23 DIAGNOSIS — M961 Postlaminectomy syndrome, not elsewhere classified: Secondary | ICD-10-CM

## 2020-10-25 DIAGNOSIS — L218 Other seborrheic dermatitis: Secondary | ICD-10-CM | POA: Diagnosis not present

## 2020-10-25 DIAGNOSIS — L299 Pruritus, unspecified: Secondary | ICD-10-CM | POA: Diagnosis not present

## 2020-10-28 ENCOUNTER — Ambulatory Visit (HOSPITAL_COMMUNITY)
Admission: RE | Admit: 2020-10-28 | Discharge: 2020-10-28 | Disposition: A | Discharge status: Home / Self Care | Payer: Medicare HMO | Source: Ambulatory Visit | Attending: Neurosurgery | Admitting: Neurosurgery

## 2020-10-28 ENCOUNTER — Other Ambulatory Visit (HOSPITAL_COMMUNITY): Payer: Self-pay | Admitting: Neurosurgery

## 2020-10-28 ENCOUNTER — Other Ambulatory Visit: Payer: Self-pay

## 2020-10-28 DIAGNOSIS — I739 Peripheral vascular disease, unspecified: Secondary | ICD-10-CM

## 2020-11-06 NOTE — Progress Notes (Deleted)
Cardiology Office Note    Date:  11/06/2020   ID:  Catherine Green, DOB 10/02/1940, MRN [id number]  PCP:  Tonia Ghent, MD  Cardiologist:  Ida Rogue, MD  Electrophysiologist:  None   Chief Complaint: Follow-up  History of Present Illness:   Catherine Green is a 80 y.o. female with history of CAD status post three-vessel CABG in 2006, HFpEF, bilateral carotid artery disease status post bilateral CEA followed by vascular surgery, HTN, HLD,multiple medication intolerances,back pain status post surgery, prior tobacco use starting at age 56 andquitting at age 17 who presents for follow-up of***.  Most recent cardiac cath from 1/2009demonstrated an atretic LIMA to LAD with patent vein grafts.  She was seen at her PCPs office in 10/2019 with dyspnea. BNP mildly elevated at 300.Her symptoms werefelt to be likely multifactorial including volume overload and mild longstanding anemia. She was started on Lasix 40 mg daily. Echo in 11/2019 showed a hyperdynamic LVSF with an EF of 65 to 02%, grade 3 diastolic dysfunction, normal RV systolic function and RV cavity size, and trivial mitral regurgitation. Unable to estimate right heart pressures, IVC not documented as being dilated. Small pleural effusion was noted. Following this echo she was taking Lasix 40 mg twice daily with continued swelling. Recheck at her PCPs office in late 5/2021demonstrated her weight was down 6 pounds, however she continued to note bilateral pedal edema and abdominal bloating as the day progressed. She continued to have some occasional dyspnea in the afternoons. Her appetite remained down. She was seen in the office on 02/06/2020 noting increased dyspnea, PND, 2-3 pillow orthopnea, and increased fatigue. BP was elevated in the 725D to 664Q systolic. Her weight was down 8 pounds in the past 3 months with notes indicating she was missing meals. She reported nausea when eating with associated dysphagia. Repeat  BNP mildly improved at 239 with stable renal function. She was taking Lasix 40 mg daily. It was felt there was some component of venous insufficiency with her lower extremity swelling. It was recommended she increase her Lasix to 40 mg twice daily for 1 week followed by 40 mg daily thereafter.She was seen in 02/2020 noting improvement in her dyspnea following diuresis and had continued to take Lasix 40 mg twice daily since her visit in 01/2020. Her weight is down another 6 pounds when compared to her last clinic visit. She did note some positional dizziness and generalized fatigue. She was orthostatic in the office that day. Lasix was decreased to 40 mg daily. Symptoms felt similar to her presentation leading up to her bypass. In this setting, she underwent Lexiscan MPI on 03/15/2020 which showed no significant ischemia, normal wall motion, EF 76%, attenuation corrected images with aortic atherosclerosis and coronary artery calcifications, and was overall a low risk scan. EKG showed PVCs. CTA of the neck from 04/23/2020 showed no dissection or pseudoaneurysm without interval growth of remodeling/fusiform aneurysm within the right internal carotid artery, 30% left ICA stenosis, and 70% stenosis of the brachiocephalic origin. There was 40% subclavian artery stenosis. She was seen in the office on 04/29/2020 noting her dizziness persisted. Her weight was stable. She had further decreased her Lasix to 20 mg daily in the setting of ongoing dizziness. She denied any shortness of breath or lower extremity swelling. Orthostatic vital signs remained positive at that visit. Imdur was discontinued and carvedilol was decreased to 3.125 mg twice daily.  Following this, she contacted our office in 05/2020, noting continued orthostasis, and in  this setting carvedilol was held.  She was seen in the office on 05/29/2020, and continued to note positional dizziness.  She was no longer on carvedilol or Imdur though was  taking Lasix 40 mg daily.  Home BP readings were predominantly in the 443X to 540G systolic.  Her dizziness was noted to date back at least to 02/2018.  She declined head CT.  She was advised to follow-up with her PCP.  It was again recommended her Lasix be decreased to 20 mg daily.  She was hypertensive when lying for orthostatic BP with orthostatics continuing to be mildly positive.  Renal artery ultrasound showed no evidence of renal artery stenosis.  She was last seen in the office in 07/2020 noting some improvement in her chronic dizziness.  BP readings ranged from the 867Y to 195 systolic with most heart rates in the 60s bpm. Carotid artery ultrasound in 07/2020, showed 1-39% bilateral ICA stenosis, antegrade flow along the vertebral arteries bilaterally, normal flow hemodynamics in the bilateral subclavian arteries. Bilateral ABI's were normal in 10/2020.   ***   Labs independently reviewed: 07/2020 - TSH normal, BUN 14, serum creatinine 0.87, potassium 4.6, Hgb 12.6, PLT 318 12/2019-albumin 4.4, AST/ALT normal 10/2019-A1c 5.8 01/2019-TC 125, TG 109, HDL 58, LDL 45 03/2018-TSH normal   Past Medical History:  Diagnosis Date  . Allergy    hay fever  . Anemia   . Anxiety   . Back pain   . Carotid artery occlusion   . Cerebrovascular disease    extracranial; occlusive  . Chicken pox   . Coronary artery disease   . Depression   . Dizziness   . Dizziness   . DVT (deep venous thrombosis) ([place])   . Fainting spell   . GERD (gastroesophageal reflux disease)   . Headache   . Heart murmur   . Hyperlipidemia   . Hypertension   . Leg pain   . PONV (postoperative nausea and vomiting)    severe nausea and vomiting  . Pre-syncope   . PVD (peripheral vascular disease) ([place])    endarterectomy by [name]  . Seasonal allergies   . Shortness of breath dyspnea    wth ambulation at times  . Swelling of both ankles    and abdomen; takes Lasix when needed  . Thrombophlebitis     following childbirth  . Ulcer     Past Surgical History:  Procedure Laterality Date  . APPENDECTOMY    . BACK SURGERY  01/14/16  . BREAST SURGERY     saline implants  . CARDIAC CATHETERIZATION    . CAROTID ENDARTERECTOMY  1992  . CAROTID ENDARTERECTOMY Right February 13, 2005   Re-do Right CE  . CATARACT EXTRACTION, BILATERAL  2013  . CHOLECYSTECTOMY    . CHOLECYSTECTOMY, LAPAROSCOPIC  04/02/14   Dr. Rochel Brome  . CORONARY ARTERY BYPASS GRAFT  2006   x3 Dr. Prescott Gum  . ENDARTERECTOMY Left 07/20/2014   Procedure: ENDARTERECTOMY CAROTID-LEFT;  Surgeon: Rosetta Posner, MD;  Location: [facility];  Service: Vascular;  Laterality: Left;  . EYE SURGERY Bilateral Feb. 2013   Cataract Left eye  . SEPTOPLASTY     with bilateral inferior turbinate reductions  . SPINE SURGERY  12/2015  . TOTAL ABDOMINAL HYSTERECTOMY    . UPPER GASTROINTESTINAL ENDOSCOPY     had polyps removed from esophagus  . UPPER GI ENDOSCOPY      Current Medications: No outpatient medications have been marked as taking for the 11/08/20  encounter (Appointment) with Rise Mu, PA-C.    Allergies:   Lyrica [pregabalin], Hydrocodone, Ivp dye [iodinated diagnostic agents], Lipitor [atorvastatin], Oxycodone hcl, Penicillin g, Shellfish allergy, Prednisone, Augmentin [amoxicillin-pot clavulanate], Codeine, Crestor [rosuvastatin calcium], Cymbalta [duloxetine hcl], Demerol, Doxycycline, Effexor [venlafaxine], Hydrochlorothiazide, Hydroxyzine, Metoprolol, Protonix [pantoprazole sodium], and Trazodone and nefazodone   Social History   Socioeconomic History  . Marital status: Married    Spouse name: Jeneen Rinks   . Number of children: 1  . Years of education: Not on file  . Highest education level: 11th grade  Occupational History    Comment: Retired   Tobacco Use  . Smoking status: Former Smoker    Packs/day: 0.30    Years: 15.00    Pack years: 4.50    Types: Cigarettes    Quit date: 08/31/1996    Years since quitting: 24.2  .  Smokeless tobacco: Never Used  Vaping Use  . Vaping Use: Never used  Substance and Sexual Activity  . Alcohol use: No    Alcohol/week: 0.0 standard drinks  . Drug use: No  . Sexual activity: Not Currently  Other Topics Concern  . Not on file  Social History Narrative   Lives with husband   Work - retired Emergency planning/management officer   Diet - healthy   Right handed    Caffeine- 1 cup per day.   Social Determinants of Health   Financial Resource Strain: Not on file  Food Insecurity: Not on file  Transportation Needs: Not on file  Physical Activity: Not on file  Stress: Not on file  Social Connections: Not on file     Family History:  The patient's family history includes Aneurysm in her brother and mother; Birth defects in her brother; Bleeding Disorder in her mother and sister; Cancer in her sister; Cirrhosis in her father; Deep vein thrombosis in her brother; Heart attack in her father; Heart disease in her brother, father, mother, and sister; Hyperlipidemia in her brother, daughter, father, mother, and sister; Hypertension in her brother, daughter, father, mother, sister, and another family member; Peripheral vascular disease in her daughter; Varicose Veins in her mother and sister. There is no history of Breast cancer or Colon cancer.  ROS:   ROS   EKGs/Labs/Other Studies Reviewed:    Studies reviewed were summarized above. The additional studies were reviewed today:  2D echo 11/2019: 1. Left ventricular ejection fraction, by estimation, is 65 to 70%. The  left ventricle has normal function. Left ventricular endocardial border  not optimally defined to evaluate regional wall motion. Left ventricular  diastolic parameters are consistent  with Grade III diastolic dysfunction (restrictive). Elevated left atrial  pressure.  2. Right ventricular systolic function is normal. The right ventricular  size is normal. Tricuspid regurgitation signal is inadequate for assessing  PA pressure.   3. The mitral valve is grossly normal. Trivial mitral valve  regurgitation.  4. The aortic valve was not well visualized. Aortic valve regurgitation  is not visualized. No aortic stenosis is present.  5. The inferior vena cava is normal in size with greater than 50%  respiratory variability, suggesting right atrial pressure of 3 mmHg. __________  Carlton Adam MPI 02/2020: Pharmacological myocardial perfusion imaging study with no significant ischemia Normal wall motion, EF estimated at 76% No EKG changes concerning for ischemia at peak stress or in recovery. Resting EKG with PVCs Attenuation correction images with aortic atherosclerosis and coronary calcification Low risk scan   EKG:  EKG is ordered today.  The EKG  ordered today demonstrates ***  Recent Labs: 01/22/2020: ALT 10; Pro B Natriuretic peptide (BNP) 239.0 07/29/2020: BUN 14; Creatinine, Ser 0.87; Hemoglobin 12.6; Platelets 318; Potassium 4.6; Sodium 142; TSH 1.150  Recent Lipid Panel    Component Value Date/Time   CHOL 125 02/01/2019 0907   CHOL 136 06/29/2016 0945   TRIG 109.0 02/01/2019 0907   HDL 58.40 02/01/2019 0907   HDL 57 06/29/2016 0945   CHOLHDL 2 02/01/2019 0907   VLDL 21.8 02/01/2019 0907   LDLCALC 45 02/01/2019 0907   LDLCALC 55 06/29/2016 0945   LDLDIRECT 139.1 04/11/2012 1131    PHYSICAL EXAM:    VS:  There were no vitals taken for this visit.  BMI: There is no height or weight on file to calculate BMI.  Physical Exam  Wt Readings from Last 3 Encounters:  09/13/20 145 lb (65.8 kg)  08/08/20 142 lb (64.4 kg)  07/29/20 149 lb (67.6 kg)     ASSESSMENT & PLAN:   1. CAD s/p CANG without***angina:  2. Dizziness/***:  3. HTN: Blood pressure ***  4. HLD: 5. Carotid artery disease: 1-39% stenosis bilaterally by recent ultrasound.  ***  6. HFpEF:  Disposition: F/u with Dr. Rockey Situ or an APP in ***.   Medication Adjustments/Labs and Tests Ordered: Current medicines are reviewed at  length with the patient today.  Concerns regarding medicines are outlined above. Medication changes, Labs and Tests ordered today are summarized above and listed in the Patient Instructions accessible in Encounters.   Signed, Christell Faith, PA-C 11/06/2020 11:07 AM     [facility] [name] [place], [place] [address] [phone]

## 2020-11-08 ENCOUNTER — Ambulatory Visit: Payer: Medicare HMO | Admitting: Physician Assistant

## 2020-11-11 ENCOUNTER — Other Ambulatory Visit: Payer: Self-pay

## 2020-11-11 ENCOUNTER — Ambulatory Visit (INDEPENDENT_AMBULATORY_CARE_PROVIDER_SITE_OTHER): Payer: Medicare HMO | Admitting: Family Medicine

## 2020-11-11 ENCOUNTER — Telehealth: Payer: Self-pay

## 2020-11-11 ENCOUNTER — Encounter: Payer: Self-pay | Admitting: Family Medicine

## 2020-11-11 DIAGNOSIS — R238 Other skin changes: Secondary | ICD-10-CM | POA: Diagnosis not present

## 2020-11-11 DIAGNOSIS — M545 Low back pain, unspecified: Secondary | ICD-10-CM

## 2020-11-11 MED ORDER — TRAMADOL HCL 50 MG PO TABS: 50.0000 mg | ORAL_TABLET | Freq: Every morning | ORAL | Status: DC

## 2020-11-11 NOTE — Telephone Encounter (Signed)
-----   Message from Tonia Ghent, MD sent at 11/11/2020 12:07 PM EDT ----- Please call [facility].  She doesn't have lesions on scalp but still is itching.  Topical steroids didn't help with itching.  I need stop options for her.   Brigitte Pulse

## 2020-11-11 NOTE — Progress Notes (Unsigned)
This visit occurred during the SARS-CoV-2 public health emergency.  Safety protocols were in place, including screening questions prior to the visit, additional usage of staff PPE, and extensive cleaning of exam room while observing appropriate contact time as indicated for disinfecting solutions.  Scalp and skin symptoms. Itchy scalp.  Small pimples noted on scalp.  Irritated flaky area on the R posterior scalp.  She had been on antibiotics prev, didn't improve.  Betamethasone didn't help.  She is intolerant of prednisone.  Face is itchy at night.  She doesn't have lesions otherwise.  Was seen at [facility] dermatology.    She is seeing visions of other peoples at night.  Started after taking tramadol.  She stopped taking it at night and still had sx.  Tramadol clearly helped her pain.  Her back pain and leg pain are note 100% resolved but are clearly better.  She had prev injection.   Meds, vitals, and allergies reviewed.   ROS: Per HPI unless specifically indicated in ROS section   nad ncat No scalp lesions seen except for a small amount of irritation just a few millimeters across on the left posterior portion of the scalp. She does not have patchy hair loss. Neck supple, no LA rrr ctab Speech normal.  Judgment intact.  Normal mentation.

## 2020-11-11 NOTE — Patient Instructions (Addendum)
I would try taking tramadol only in the AM if needed.  See if that helps with visions at night.  If you still have troubles at night, then stop it totally.  Let me check with dermatology in the meantime.   Take care.  Glad to see you.

## 2020-11-11 NOTE — Telephone Encounter (Signed)
[facility] and left message for patients provider to call Dr. Damita Dunnings today about patients tx options.

## 2020-11-12 ENCOUNTER — Ambulatory Visit
Admission: RE | Admit: 2020-11-12 | Discharge: 2020-11-12 | Disposition: A | Discharge status: Home / Self Care | Payer: Medicare HMO | Source: Ambulatory Visit | Attending: Neurosurgery | Admitting: Neurosurgery

## 2020-11-12 DIAGNOSIS — M961 Postlaminectomy syndrome, not elsewhere classified: Secondary | ICD-10-CM

## 2020-11-12 DIAGNOSIS — M545 Low back pain, unspecified: Secondary | ICD-10-CM | POA: Diagnosis not present

## 2020-11-13 DIAGNOSIS — R238 Other skin changes: Secondary | ICD-10-CM | POA: Insufficient documentation

## 2020-11-13 MED ORDER — LEVOCETIRIZINE DIHYDROCHLORIDE 5 MG PO TABS: 5.0000 mg | ORAL_TABLET | Freq: Two times a day (BID) | ORAL | 1 refills | Status: DC | PRN

## 2020-11-13 MED ORDER — BETAMETHASONE DIPROPIONATE 0.05 % EX LOTN: TOPICAL_LOTION | CUTANEOUS | 1 refills | Status: DC

## 2020-11-13 NOTE — Telephone Encounter (Signed)
Were you ever able to speak with [facility] about this patient?

## 2020-11-13 NOTE — Assessment & Plan Note (Signed)
Improved from before but likely with nocturnal visions related to tramadol use.  She can use it just in the morning for pain.  If she continues to have visions at night then it makes sense to stop the medication.  She agrees to plan.

## 2020-11-13 NOTE — Telephone Encounter (Signed)
Please update patient.  I was able to talk to derm.  I appreciate their input.  I would continue betamethasone lotion- I sent a new rx.  Would use betamethasone TID.  Also try using xzyal BID for itching.  I sent that to pharmacy too.  Then f/u with derm if not better.  Thanks.

## 2020-11-13 NOTE — Addendum Note (Signed)
Addended by: Tonia Ghent on: 11/13/2020 02:26 PM   Modules accepted: Orders

## 2020-11-13 NOTE — Telephone Encounter (Signed)
Spoke with patient and advised her to continue betamethasone lotion and to try taking xzyal BID; both erx sent. Advised to f/u with derm if no better.

## 2020-11-13 NOTE — Assessment & Plan Note (Signed)
With itching being the most bothersome symptom.  She has some diffuse hair loss. I called over to dermatology and discussed her case.  Would increase xzyal to BID and use betamethasone TID.  Then f/u with derm prn.  I appreciate the help of all involved.

## 2020-11-15 NOTE — Progress Notes (Signed)
Cardiology Office Note    Date:  11/21/2020   ID:  Catherine Green, DOB [birth year], MRN [id number]  PCP:  Tonia Ghent, MD  Cardiologist:  Ida Rogue, MD  Electrophysiologist:  None   Chief Complaint: Follow up  History of Present Illness:   Catherine Green is a 80 y.o. female with history of CAD status post three-vessel CABG in 2006, HFpEF, bilateral carotid artery disease status post bilateral CEA followed by vascular surgery, HTN, HLD,multiple medication intolerances,back pain status post surgery, prior tobacco use starting at age 8 andquitting at age 53 who presents for follow-up ofchronic dizziness.  Most recent cardiac cath from 1/2009demonstrated an atretic LIMA to LAD with patent vein grafts.  She was seen at her PCPs office in 10/2019 with dyspnea. BNP mildly elevated at 300.Her symptoms werefelt to be likely multifactorial including volume overload and mild longstanding anemia. She was started on Lasix 40 mg daily. Echo in 11/2019 showed a hyperdynamic LVSF with an EF of 65 to 78%, grade 3 diastolic dysfunction, normal RV systolic function and RV cavity size, and trivial mitral regurgitation. Unable to estimate right heart pressures, IVC not documented as being dilated. Small pleural effusion was noted. Following this echo she was taking Lasix 40 mg twice daily with continued swelling. Recheck at her PCPs office in late 5/2021demonstrated her weight was down 6 pounds, however she continued to note bilateral pedal edema and abdominal bloating as the day progressed. She continued to have some occasional dyspnea in the afternoons. Her appetite remained down. She was seen in the office on 02/06/2020 noting increased dyspnea, PND, 2-3 pillow orthopnea, and increased fatigue. BP was elevated in the 295A to 213Y systolic. Her weight was down 8 pounds in the past 3 months with notes indicating she was missing meals. She reported nausea when eating with associated  dysphagia. Repeat BNP mildly improved at 239 with stable renal function. She was taking Lasix 40 mg daily. It was felt there was some component of venous insufficiency with her lower extremity swelling. It was recommended she increase her Lasix to 40 mg twice daily for 1 week followed by 40 mg daily thereafter.She was seen in 02/2020 noting improvement in her dyspnea following diuresis and had continued to take Lasix 40 mg twice daily since her visit in 01/2020. Her weight is down another 6 pounds when compared to her last clinic visit. She did note some positional dizziness and generalized fatigue. She was orthostatic in the office that day. Lasix was decreased to 40 mg daily. Symptoms felt similar to her presentation leading up to her bypass. In this setting, she underwent Lexiscan MPI on 03/15/2020 which showed no significant ischemia, normal wall motion, EF 76%, attenuation corrected images with aortic atherosclerosis and coronary artery calcifications, and was overall a low risk scan. EKG showed PVCs. CTA of the neck from 04/23/2020 showed no dissection or pseudoaneurysm without interval growth of remodeling/fusiform aneurysm within the right internal carotid artery, 30% left ICA stenosis, and 70% stenosis of the brachiocephalic origin. There was 40% subclavian artery stenosis. She was seen in the office on 04/29/2020 noting her dizziness persisted. Her weight was stable. She had further decreased her Lasix to 20 mg daily in the setting of ongoing dizziness. She denied any shortness of breath or lower extremity swelling. Orthostatic vital signs remained positive at that visit. Imdur was discontinued and carvedilol was decreased to 3.125 mg twice daily.  Following this, she contacted our office in 05/2020, noting continued orthostasis,  and in this setting carvedilol was held.  She was seen in the office on 05/29/2020, and continued to note positional dizziness.  She was no longer on carvedilol or  Imdur though was taking Lasix 40 mg daily.  Home BP readings were predominantly in the 096G to 836O systolic.  Her dizziness was noted to date back at least to 02/2018.  She declined head CT.  She was advised to follow-up with her PCP.  It was again recommended her Lasix be decreased to 20 mg daily.  She was hypertensive when lying for orthostatic BP with orthostatics continuing to be mildly positive.  Renal artery ultrasound showed no evidence of renal artery stenosis.  She was last seen in the office in 07/2020 noting some improvement in her chronic dizziness.  BP readings ranged from the 294T to 654 systolic with most heart rates in the 60s bpm. Carotid artery ultrasound in 07/2020, showed 1-39% bilateral ICA stenosis, antegrade flow along the vertebral arteries bilaterally, normal flow hemodynamics in the bilateral subclavian arteries. Bilateral ABI's were normal in 10/2020.   She comes in today doing well from a cardiac perspective.  No chest pain, palpitations, dyspnea, presyncope, or syncope.  She does continue to note a significant improvement in her chronic dizziness when having permissive BP.  On average she is typically taking her as needed carvedilol on most mornings due to BP greater than 650 systolic.  She does occasionally have to take this in the afternoon as well.  With this she has noted some fluctuations in her BP.  Readings have ranged 354-656 systolic.   Labs independently reviewed: 07/2020 - TSH normal, BUN 14, serum creatinine 0.87, potassium 4.6, Hgb 12.6, PLT 318 12/2019-albumin 4.4, AST/ALT normal 10/2019-A1c 5.8 01/2019-TC 125, TG 109, HDL 58, LDL 45 03/2018-TSH normal   Past Medical History:  Diagnosis Date  . Allergy    hay fever  . Anemia   . Anxiety   . Back pain   . Carotid artery occlusion   . Cerebrovascular disease    extracranial; occlusive  . Chicken pox   . Coronary artery disease   . Depression   . Dizziness   . Dizziness   . DVT (deep venous  thrombosis) ([place])   . Fainting spell   . GERD (gastroesophageal reflux disease)   . Headache   . Heart murmur   . Hyperlipidemia   . Hypertension   . Leg pain   . PONV (postoperative nausea and vomiting)    severe nausea and vomiting  . Pre-syncope   . PVD (peripheral vascular disease) ([place])    endarterectomy by [name]  . Seasonal allergies   . Shortness of breath dyspnea    wth ambulation at times  . Swelling of both ankles    and abdomen; takes Lasix when needed  . Thrombophlebitis    following childbirth  . Ulcer     Past Surgical History:  Procedure Laterality Date  . APPENDECTOMY    . BACK SURGERY  01/14/16  . BREAST SURGERY     saline implants  . CARDIAC CATHETERIZATION    . CAROTID ENDARTERECTOMY  1992  . CAROTID ENDARTERECTOMY Right February 13, 2005   Re-do Right CE  . CATARACT EXTRACTION, BILATERAL  2013  . CHOLECYSTECTOMY    . CHOLECYSTECTOMY, LAPAROSCOPIC  04/02/14   Dr. Rochel Brome  . CORONARY ARTERY BYPASS GRAFT  2006   x3 Dr. Prescott Gum  . ENDARTERECTOMY Left 07/20/2014   Procedure: ENDARTERECTOMY CAROTID-LEFT;  Surgeon:  Rosetta Posner, MD;  Location: [facility];  Service: Vascular;  Laterality: Left;  . EYE SURGERY Bilateral Feb. 2013   Cataract Left eye  . SEPTOPLASTY     with bilateral inferior turbinate reductions  . SPINE SURGERY  12/2015  . TOTAL ABDOMINAL HYSTERECTOMY    . UPPER GASTROINTESTINAL ENDOSCOPY     had polyps removed from esophagus  . UPPER GI ENDOSCOPY      Current Medications: Current Meds  Medication Sig  . aspirin 81 MG tablet Take 1 tablet (81 mg total) by mouth daily.  . betamethasone dipropionate 0.05 % lotion APPLY TO BUMPS ON SCALP 3 TIMES DAILY FOR ITCHING  . carvedilol (COREG) 3.125 MG tablet Take 1 tablet (3.125 mg total) by mouth 2 (two) times daily.  . Coenzyme Q10 (CO Q10) 100 MG CAPS Take 100 mg by mouth daily.  . Evolocumab (REPATHA SURECLICK) 144 MG/ML SOAJ Inject 140 mg into the skin every 14 (fourteen) days.  Marland Kitchen  ezetimibe (ZETIA) 10 MG tablet TAKE ONE TABLET EVERY DAY  . fluticasone (FLONASE) 50 MCG/ACT nasal spray Place 1 spray into both nostrils daily.  . furosemide (LASIX) 40 MG tablet Take 0.5 tablets (20 mg total) by mouth daily.  Marland Kitchen gabapentin (NEURONTIN) 400 MG capsule Take 400 mg by mouth every 8 (eight) hours.  Marland Kitchen ketoconazole (NIZORAL) 2 % shampoo SHAMPOO WITH A SMALL AMOUNT TO SKIN THREE TIMES A WEEK. APPLY TO SCALP, LATHER, LET SIT 3-5 MINUTES, THEN WASH OUT  . levocetirizine (XYZAL) 5 MG tablet Take 1 tablet (5 mg total) by mouth 2 (two) times daily as needed (for itching).  Marland Kitchen loratadine (CLARITIN) 10 MG tablet Take 10 mg by mouth daily.  Marland Kitchen omeprazole (PRILOSEC) 20 MG capsule Take 1 capsule (20 mg total) by mouth daily.  . potassium chloride (KLOR-CON) 10 MEQ tablet Take 1 tablet (10 mEq total) by mouth daily.  . sertraline (ZOLOFT) 100 MG tablet TAKE ONE TABLET BY MOUTH EVERY DAY  . traMADol (ULTRAM) 50 MG tablet Take 1 tablet (50 mg total) by mouth in the morning.  . vitamin B-12 (CYANOCOBALAMIN) 1000 MCG tablet Take 1 tablet (1,000 mcg total) by mouth daily.  . [DISCONTINUED] carvedilol (COREG) 3.125 MG tablet Take 1 tablet (3.125 mg total) by mouth 2 (two) times daily as needed (systolic blood pressure greater than 150).    Allergies:   Lyrica [pregabalin], Hydrocodone, Ivp dye [iodinated diagnostic agents], Lipitor [atorvastatin], Oxycodone hcl, Penicillin g, Shellfish allergy, Clavulanic acid, Prednisone, Augmentin [amoxicillin-pot clavulanate], Codeine, Crestor [rosuvastatin calcium], Cymbalta [duloxetine hcl], Demerol, Doxycycline, Effexor [venlafaxine], Hydrochlorothiazide, Hydroxyzine, Metoprolol, Protonix [pantoprazole sodium], and Trazodone and nefazodone   Social History   Socioeconomic History  . Marital status: Married    Spouse name: Jeneen Rinks   . Number of children: 1  . Years of education: Not on file  . Highest education level: 11th grade  Occupational History    Comment:  Retired   Tobacco Use  . Smoking status: Former Smoker    Packs/day: 0.30    Years: 15.00    Pack years: 4.50    Types: Cigarettes    Quit date: 08/31/1996    Years since quitting: 24.2  . Smokeless tobacco: Never Used  Vaping Use  . Vaping Use: Never used  Substance and Sexual Activity  . Alcohol use: No    Alcohol/week: 0.0 standard drinks  . Drug use: No  . Sexual activity: Not Currently  Other Topics Concern  . Not on file  Social History Narrative  Lives with husband   Work - retired Emergency planning/management officer   Diet - healthy   Right handed    Caffeine- 1 cup per day.   Social Determinants of Health   Financial Resource Strain: Not on file  Food Insecurity: Not on file  Transportation Needs: Not on file  Physical Activity: Not on file  Stress: Not on file  Social Connections: Not on file     Family History:  The patient's family history includes Aneurysm in her brother and mother; Birth defects in her brother; Bleeding Disorder in her mother and sister; Cancer in her sister; Cirrhosis in her father; Deep vein thrombosis in her brother; Heart attack in her father; Heart disease in her brother, father, mother, and sister; Hyperlipidemia in her brother, daughter, father, mother, and sister; Hypertension in her brother, daughter, father, mother, sister, and another family member; Peripheral vascular disease in her daughter; Varicose Veins in her mother and sister. There is no history of Breast cancer or Colon cancer.  ROS:   Review of Systems  Constitutional: Positive for malaise/fatigue. Negative for chills, diaphoresis, fever and weight loss.  HENT: Negative for congestion.   Eyes: Negative for discharge and redness.  Respiratory: Negative for cough, sputum production, shortness of breath and wheezing.   Cardiovascular: Negative for chest pain, palpitations, orthopnea, claudication, leg swelling and PND.  Gastrointestinal: Negative for abdominal pain, heartburn, nausea and  vomiting.  Musculoskeletal: Negative for falls and myalgias.  Skin: Negative for rash.  Neurological: Positive for dizziness. Negative for tingling, tremors, sensory change, speech change, focal weakness, loss of consciousness and weakness.       Improved dizziness  Endo/Heme/Allergies: Does not bruise/bleed easily.  Psychiatric/Behavioral: Negative for substance abuse. The patient is not nervous/anxious.      EKGs/Labs/Other Studies Reviewed:    Studies reviewed were summarized above. The additional studies were reviewed today:  2D echo 11/2019: 1. Left ventricular ejection fraction, by estimation, is 65 to 70%. The  left ventricle has normal function. Left ventricular endocardial border  not optimally defined to evaluate regional wall motion. Left ventricular  diastolic parameters are consistent  with Grade III diastolic dysfunction (restrictive). Elevated left atrial  pressure.  2. Right ventricular systolic function is normal. The right ventricular  size is normal. Tricuspid regurgitation signal is inadequate for assessing  PA pressure.  3. The mitral valve is grossly normal. Trivial mitral valve  regurgitation.  4. The aortic valve was not well visualized. Aortic valve regurgitation  is not visualized. No aortic stenosis is present.  5. The inferior vena cava is normal in size with greater than 50%  respiratory variability, suggesting right atrial pressure of 3 mmHg. __________  Carlton Adam MPI 02/2020: Pharmacological myocardial perfusion imaging study with no significant ischemia Normal wall motion, EF estimated at 76% No EKG changes concerning for ischemia at peak stress or in recovery. Resting EKG with PVCs Attenuation correction images with aortic atherosclerosis and coronary calcification Low risk scan    EKG:  EKG is not ordered today.    Recent Labs: 01/22/2020: ALT 10; Pro B Natriuretic peptide (BNP) 239.0 07/29/2020: BUN 14; Creatinine, Ser 0.87;  Hemoglobin 12.6; Platelets 318; Potassium 4.6; Sodium 142; TSH 1.150  Recent Lipid Panel    Component Value Date/Time   CHOL 125 02/01/2019 0907   CHOL 136 06/29/2016 0945   TRIG 109.0 02/01/2019 0907   HDL 58.40 02/01/2019 0907   HDL 57 06/29/2016 0945   CHOLHDL 2 02/01/2019 0907   VLDL 21.8 02/01/2019 0907  LDLCALC 45 02/01/2019 0907   LDLCALC 55 06/29/2016 0945   LDLDIRECT 139.1 04/11/2012 1131    PHYSICAL EXAM:    VS:  BP 138/60 (BP Location: Left Arm, Patient Position: Sitting, Cuff Size: Normal)   Pulse 66   Ht 5\' 2"  (1.575 m)   Wt 143 lb (64.9 kg)   SpO2 93%   BMI 26.16 kg/m   BMI: Body mass index is 26.16 kg/m.  Physical Exam Vitals reviewed.  Constitutional:      Appearance: She is well-developed.  HENT:     Head: Normocephalic and atraumatic.  Eyes:     General:        Right eye: No discharge.        Left eye: No discharge.  Neck:     Vascular: No JVD.  Cardiovascular:     Rate and Rhythm: Normal rate and regular rhythm.     Pulses: No midsystolic click and no opening snap.          Carotid pulses are on the right side with bruit and on the left side with bruit.    Heart sounds: Normal heart sounds, S1 normal and S2 normal. Heart sounds not distant. No murmur heard. No friction rub.  Pulmonary:     Effort: Pulmonary effort is normal. No respiratory distress.     Breath sounds: Normal breath sounds. No decreased breath sounds, wheezing or rales.  Chest:     Chest wall: No tenderness.  Abdominal:     General: There is no distension.     Palpations: Abdomen is soft.     Tenderness: There is no abdominal tenderness.  Musculoskeletal:     Cervical back: Normal range of motion.  Skin:    General: Skin is warm and dry.     Nails: There is no clubbing.  Neurological:     Mental Status: She is alert and oriented to person, place, and time.  Psychiatric:        Speech: Speech normal.        Behavior: Behavior normal.        Thought Content: Thought  content normal.        Judgment: Judgment normal.     Wt Readings from Last 3 Encounters:  11/21/20 143 lb (64.9 kg)  11/11/20 144 lb (65.3 kg)  09/13/20 145 lb (65.8 kg)     ASSESSMENT & PLAN:   1. CAD s/p CABG without angina: She is doing well without any symptoms concerning for angina.  Recent Lexiscan MPI showed no evidence of ischemia.  Continue secondary prevention with aspirin, Zetia, and Repatha.  Continue carvedilol as outlined above with noted improvement in dizziness and orthostatic hypotension.  No indication for further ischemic testing at this time.  2. Chronic dizziness: Her chronic dizziness is a longstanding issue that was significantly improved with tapering of hypertensive medication to allow for a slightly more permissive BP.  With this she has noted some elevations in her BP and in this setting we have agreed to resume carvedilol 3.125 mg twice daily scheduled instead of as needed dosing.  Recommend adequate hydration.  3. HTN: Blood pressure is reasonably controlled in the office today.  Home readings have been in the 150s to 160s for the most part with occasional readings in the 431V systolic.  She has frequently been taking as needed carvedilol in the morning and occasionally in the afternoons as well.  With this that she has noted some fluctuations in her BP which concerned  her.  Given noted improvement in chronic dizziness and with elevated BP readings at home we have elected to transition her as needed dose carvedilol to scheduled dosing at 3.125 mg twice daily.  She will otherwise continue low-dose Lasix.  She will contact us if being placed on scheduled Coreg leads to a return in her chronic dizziness or soft BP.  4. HLD: LDL 45 from 01/2019 with normal LFT in 02/2020.  She remains on Zetia and Repatha.  She is intolerant to statins.  5. Carotid artery disease: 1-39% stenosis bilaterally by recent ultrasound.  Followed by vascular surgery.  She remains on lipid therapy  as outlined above as well as aspirin.  6. HFpEF: She appears euvolemic and well compensated with a weight that is down 6 pounds when compared to her last clinic visit in 07/2020.  She remains on Lasix 20 mg daily.  Unable to add spironolactone with noted history of orthostasis.  Disposition: F/u with Dr. Rockey Situ or an APP in 3 months.   Medication Adjustments/Labs and Tests Ordered: Current medicines are reviewed at length with the patient today.  Concerns regarding medicines are outlined above. Medication changes, Labs and Tests ordered today are summarized above and listed in the Patient Instructions accessible in Encounters.   Signed, Christell Faith, PA-C 11/21/2020 1:12 PM     [address], [place] [address] [phone]

## 2020-11-21 ENCOUNTER — Ambulatory Visit: Payer: Medicare HMO | Admitting: Physician Assistant

## 2020-11-21 ENCOUNTER — Encounter: Payer: Self-pay | Admitting: Physician Assistant

## 2020-11-21 ENCOUNTER — Other Ambulatory Visit: Payer: Self-pay

## 2020-11-21 VITALS — BP 138/60 | HR 66 | Ht 62.0 in | Wt 143.0 lb

## 2020-11-21 DIAGNOSIS — I6523 Occlusion and stenosis of bilateral carotid arteries: Secondary | ICD-10-CM | POA: Diagnosis not present

## 2020-11-21 DIAGNOSIS — I2510 Atherosclerotic heart disease of native coronary artery without angina pectoris: Secondary | ICD-10-CM | POA: Diagnosis not present

## 2020-11-21 DIAGNOSIS — I10 Essential (primary) hypertension: Secondary | ICD-10-CM | POA: Diagnosis not present

## 2020-11-21 DIAGNOSIS — E785 Hyperlipidemia, unspecified: Secondary | ICD-10-CM | POA: Diagnosis not present

## 2020-11-21 DIAGNOSIS — Z951 Presence of aortocoronary bypass graft: Secondary | ICD-10-CM

## 2020-11-21 DIAGNOSIS — I5032 Chronic diastolic (congestive) heart failure: Secondary | ICD-10-CM | POA: Diagnosis not present

## 2020-11-21 DIAGNOSIS — R42 Dizziness and giddiness: Secondary | ICD-10-CM

## 2020-11-21 MED ORDER — CARVEDILOL 3.125 MG PO TABS: 3.1250 mg | ORAL_TABLET | Freq: Two times a day (BID) | ORAL | 3 refills | Status: DC

## 2020-11-21 NOTE — Patient Instructions (Addendum)
Medication Instructions:  Your physician has recommended you make the following change in your medication:   1. START Carvedilol (Coreg) 3.125 mg twice a day  *If you need a refill on your cardiac medications before your next appointment, please call your pharmacy*   Lab Work: None  If you have labs (blood work) drawn today and your tests are completely normal, you will receive your results only by: Marland Kitchen MyChart Message (if you have MyChart) OR . A paper copy in the mail If you have any lab test that is abnormal or we need to change your treatment, we will call you to review the results.   Testing/Procedures: None   Follow-Up: At [facility], you and your health needs are our priority.  As part of our continuing mission to provide you with exceptional heart care, we have created designated Provider Care Teams.  These Care Teams include your primary Cardiologist (physician) and Advanced Practice Providers (APPs -  Physician Assistants and Nurse Practitioners) who all work together to provide you with the care you need, when you need it.  Your next appointment:   3 month(s)  The format for your next appointment:   In Person  Provider:   Ida Rogue, MD or Christell Faith, PA-C

## 2020-11-29 ENCOUNTER — Other Ambulatory Visit: Payer: Self-pay | Admitting: Cardiovascular Disease

## 2020-11-29 NOTE — Telephone Encounter (Signed)
Rx request sent to pharmacy.  

## 2020-12-06 DIAGNOSIS — L298 Other pruritus: Secondary | ICD-10-CM | POA: Diagnosis not present

## 2021-01-02 NOTE — Progress Notes (Signed)
Cardiology Office Note    Date:  01/08/2021   ID:  Catherine Green, DOB 1940-09-18, MRN [id number]  PCP:  Tonia Ghent, MD  Cardiologist:  Ida Rogue, MD  Electrophysiologist:  None   Chief Complaint: Elevated BP/shortness of breath  History of Present Illness:   Catherine Green is a 80 y.o. female with history of CAD status post three-vessel CABG in 2006,HFpEF,bilateral carotid artery disease status post bilateral CEA followed by vascular surgery, HTN, HLD,multiple medication intolerances,back pain status post surgery, prior tobacco use starting at age 25 andquitting at age 60 who presents for evaluation of elevated BP/shortness of breath/chest tightness.  Most recent cardiac cath from 1/2009demonstrated an atretic LIMA to LAD with patent vein grafts.  She was seen at her PCPs office in 10/2019 with dyspnea. BNP mildly elevated at 300.Her symptoms werefelt to be likely multifactorial including volume overload and mild longstanding anemia. She was started on Lasix 40 mg daily. Echo in 11/2019 showed a hyperdynamic LVSF with an EF of 65 to [id number], grade 3 diastolic dysfunction, normal RV systolic function and RV cavity size, and trivial mitral regurgitation. Unable to estimate right heart pressures, IVC not documented as being dilated. Small pleural effusion was noted. Following this echo she was taking Lasix 40 mg twice daily with continued swelling. Recheck at her PCPs office in late 5/2021demonstrated her weight was down 6 pounds, however she continued to note bilateral pedal edema and abdominal bloating as the day progressed. She continued to have some occasional dyspnea in the afternoons. Her appetite remained down. She was seen in the office on 02/06/2020 noting increased dyspnea, PND, 2-3 pillow orthopnea, and increased fatigue. BP was elevated in the [id number] to [id number] systolic. Her weight was down 8 pounds in the past 3 months with notes indicating she was missing meals.  She reported nausea when eating with associated dysphagia. Repeat BNP mildly improved at 239 with stable renal function. She was taking Lasix 40 mg daily. It was felt there was some component of venous insufficiency with her lower extremity swelling. It was recommended she increase her Lasix to 40 mg twice daily for 1 week followed by 40 mg daily thereafter.She was seen in 02/2020 noting improvement in her dyspnea following diuresis and hadcontinued to take Lasix 40 mg twicedaily since her visit in 01/2020. Her weight is down another 6 pounds when compared to her last clinic visit. She did note some positional dizziness and generalized fatigue. She was orthostatic in the office that day. Lasix was decreased to 40 mg daily. Symptoms felt similar to her presentation leading up to her bypass. In this setting, she underwent Lexiscan MPI on 03/15/2020 which showed no significant ischemia, normal wall motion, EF 76%, attenuation corrected images with aortic atherosclerosis and coronary artery calcifications, and was overall a low risk scan. EKG showed PVCs. CTA of the neck from 04/23/2020 showed no dissection or pseudoaneurysm without interval growth of remodeling/fusiform aneurysm within the right internal carotid artery, 30% left ICA stenosis, and 70% stenosis of the brachiocephalic origin. There was 40% subclavian artery stenosis. She was seen in the office on 04/29/2020 noting her dizziness persisted. Her weight was stable. She had further decreased her Lasix to 20 mg daily in the setting of ongoing dizziness. She denied any shortness of breath or lower extremity swelling. Orthostatic vital signs remained positive at that visit. Imdur was discontinued and carvedilol was decreased to 3.125 mg twice daily.  Following this, she contacted our office in 05/2020, notingcontinued  orthostasis,and in thissetting carvedilol washeld. She was seen in the office on 05/29/2020, and continuedto note positional  dizziness. She was no longer on carvedilol or Imdur though was taking Lasix 40 mg daily. Home BP readings were predominantly in the 176H to 607P systolic.Her dizziness was noted to date back at least to 02/2018. She declined head CT. She was advised to follow-up with her PCP. It was again recommended her Lasix be decreased to 20 mg daily. She was hypertensive when lying for orthostatic BP with orthostatics continuing to be mildly positive. Renal artery ultrasound showed no evidence of renal artery stenosis.  She was seen in the office in 07/2020 noting some improvement in her chronic dizziness.  BP readings ranged from the 710G to 269 systolic with most heart rates in the 60s bpm. Carotid artery ultrasound in 07/2020, showed 1-39% bilateral ICA stenosis, antegrade flow along the vertebral arteries bilaterally, normal flow hemodynamics in the bilateral subclavian arteries. Bilateral ABI's were normal in 10/2020.   She was last seen in 10/2020 and was doing well from a cardiac perspective.  She did continue to note a significant improvement in her chronic dizziness when having permissive BP.  Home BP readings were in the 150s to 160s for the most part with occasional readings in the 485I systolic.  She was frequently taking prn Coreg, and in this setting, it was recommended she take Coreg 3.125 mg bid.   She comes in today noting a several week history of elevated BP readings, particularly in the morning with many readings in the 627O to 350K systolic.  Following administration of her morning carvedilol BP will frequently improved to the 938H to 829H systolic.  When she notes her BP is elevated she also notes increased shortness of breath and chest tightness.  With improvement in her BP these symptoms also seem to improve.  No lower extremity swelling or abdominal distention.  Her weight has been stable.  She is not adding salt to food and is drinking less than 2 L of fluid per day.  She has tolerated the  addition of scheduled carvedilol without syncope.   Labs independently reviewed: 07/2020 - TSH normal, BUN 14, serum creatinine 0.87, potassium 4.6, Hgb 12.6, PLT 318 12/2019-albumin 4.4, AST/ALT normal 10/2019-A1c 5.8 01/2019-TC 125, TG 109, HDL 58, LDL 45    Past Medical History:  Diagnosis Date  . Allergy    hay fever  . Anemia   . Anxiety   . Back pain   . Carotid artery occlusion   . Cerebrovascular disease    extracranial; occlusive  . Chicken pox   . Coronary artery disease   . Depression   . Dizziness   . Dizziness   . DVT (deep venous thrombosis) ([place])   . Fainting spell   . GERD (gastroesophageal reflux disease)   . Headache   . Heart murmur   . Hyperlipidemia   . Hypertension   . Leg pain   . PONV (postoperative nausea and vomiting)    severe nausea and vomiting  . Pre-syncope   . PVD (peripheral vascular disease) ([place])    endarterectomy by [name]  . Seasonal allergies   . Shortness of breath dyspnea    wth ambulation at times  . Swelling of both ankles    and abdomen; takes Lasix when needed  . Thrombophlebitis    following childbirth  . Ulcer     Past Surgical History:  Procedure Laterality Date  . APPENDECTOMY    .  BACK SURGERY  01/14/16  . BREAST SURGERY     saline implants  . CARDIAC CATHETERIZATION    . CAROTID ENDARTERECTOMY  1992  . CAROTID ENDARTERECTOMY Right February 13, 2005   Re-do Right CE  . CATARACT EXTRACTION, BILATERAL  2013  . CHOLECYSTECTOMY    . CHOLECYSTECTOMY, LAPAROSCOPIC  04/02/14   Dr. Rochel Brome  . CORONARY ARTERY BYPASS GRAFT  2006   x3 Dr. Prescott Gum  . ENDARTERECTOMY Left 07/20/2014   Procedure: ENDARTERECTOMY CAROTID-LEFT;  Surgeon: Rosetta Posner, MD;  Location: [facility];  Service: Vascular;  Laterality: Left;  . EYE SURGERY Bilateral Feb. 2013   Cataract Left eye  . SEPTOPLASTY     with bilateral inferior turbinate reductions  . SPINE SURGERY  12/2015  . TOTAL ABDOMINAL HYSTERECTOMY    . UPPER  GASTROINTESTINAL ENDOSCOPY     had polyps removed from esophagus  . UPPER GI ENDOSCOPY      Current Medications: Current Meds  Medication Sig  . amLODipine (NORVASC) 5 MG tablet Take 1 tablet (5 mg total) by mouth at bedtime.  Marland Kitchen aspirin 81 MG tablet Take 1 tablet (81 mg total) by mouth daily.  . carvedilol (COREG) 3.125 MG tablet Take 1 tablet (3.125 mg total) by mouth 2 (two) times daily.  . Coenzyme Q10 (CO Q10) 100 MG CAPS Take 100 mg by mouth daily.  . Evolocumab (REPATHA SURECLICK) [id number] MG/ML SOAJ Inject 140 mg into the skin every 14 (fourteen) days.  Marland Kitchen ezetimibe (ZETIA) 10 MG tablet TAKE ONE TABLET EVERY DAY  . fluticasone (FLONASE) 50 MCG/ACT nasal spray Place 1 spray into both nostrils daily.  . furosemide (LASIX) 40 MG tablet Take 0.5 tablets (20 mg total) by mouth daily.  Marland Kitchen gabapentin (NEURONTIN) 400 MG capsule Take 400 mg by mouth every 8 (eight) hours.  Marland Kitchen loratadine (CLARITIN) 10 MG tablet Take 10 mg by mouth daily.  Marland Kitchen omeprazole (PRILOSEC) 20 MG capsule Take 1 capsule (20 mg total) by mouth daily.  . potassium chloride (KLOR-CON) 10 MEQ tablet Take 1 tablet (10 mEq total) by mouth daily.  . sertraline (ZOLOFT) 100 MG tablet TAKE ONE TABLET BY MOUTH EVERY DAY  . traMADol (ULTRAM) 50 MG tablet Take 1 tablet (50 mg total) by mouth in the morning.  . vitamin B-12 (CYANOCOBALAMIN) 1000 MCG tablet Take 1 tablet (1,000 mcg total) by mouth daily.    Allergies:   Lyrica [pregabalin], Hydrocodone, Ivp dye [iodinated diagnostic agents], Lipitor [atorvastatin], Oxycodone hcl, Penicillin g, Shellfish allergy, Clavulanic acid, Prednisone, Augmentin [amoxicillin-pot clavulanate], Codeine, Crestor [rosuvastatin calcium], Cymbalta [duloxetine hcl], Demerol, Doxycycline, Effexor [venlafaxine], Hydrochlorothiazide, Hydroxyzine, Metoprolol, Protonix [pantoprazole sodium], and Trazodone and nefazodone   Social History   Socioeconomic History  . Marital status: Married    Spouse name: Jeneen Rinks   .  Number of children: 1  . Years of education: Not on file  . Highest education level: 11th grade  Occupational History    Comment: Retired   Tobacco Use  . Smoking status: Former Smoker    Packs/day: 0.30    Years: 15.00    Pack years: 4.50    Types: Cigarettes    Quit date: 08/31/1996    Years since quitting: 24.3  . Smokeless tobacco: Never Used  Vaping Use  . Vaping Use: Never used  Substance and Sexual Activity  . Alcohol use: No    Alcohol/week: 0.0 standard drinks  . Drug use: No  . Sexual activity: Not Currently  Other Topics Concern  .  Not on file  Social History Narrative   Lives with husband   Work - retired Emergency planning/management officer   Diet - healthy   Right handed    Caffeine- 1 cup per day.   Social Determinants of Health   Financial Resource Strain: Not on file  Food Insecurity: Not on file  Transportation Needs: Not on file  Physical Activity: Not on file  Stress: Not on file  Social Connections: Not on file     Family History:  The patient's family history includes Aneurysm in her brother and mother; Birth defects in her brother; Bleeding Disorder in her mother and sister; Cancer in her sister; Cirrhosis in her father; Deep vein thrombosis in her brother; Heart attack in her father; Heart disease in her brother, father, mother, and sister; Hyperlipidemia in her brother, daughter, father, mother, and sister; Hypertension in her brother, daughter, father, mother, sister, and another family member; Peripheral vascular disease in her daughter; Varicose Veins in her mother and sister. There is no history of Breast cancer or Colon cancer.  ROS:   Review of Systems  Constitutional: Positive for malaise/fatigue. Negative for chills, diaphoresis, fever and weight loss.  HENT: Negative for congestion.   Eyes: Negative for discharge and redness.  Respiratory: Positive for shortness of breath. Negative for cough, sputum production and wheezing.   Cardiovascular: Negative for chest  pain, palpitations, orthopnea, claudication, leg swelling and PND.       Chest tightness  Gastrointestinal: Negative for abdominal pain, heartburn, nausea and vomiting.  Musculoskeletal: Negative for falls and myalgias.  Skin: Negative for rash.  Neurological: Positive for weakness. Negative for dizziness, tingling, tremors, sensory change, speech change, focal weakness and loss of consciousness.  Endo/Heme/Allergies: Does not bruise/bleed easily.  Psychiatric/Behavioral: Negative for substance abuse. The patient is not nervous/anxious.   All other systems reviewed and are negative.    EKGs/Labs/Other Studies Reviewed:    Studies reviewed were summarized above. The additional studies were reviewed today:  2D echo 11/2019: 1. Left ventricular ejection fraction, by estimation, is 65 to 70%. The  left ventricle has normal function. Left ventricular endocardial border  not optimally defined to evaluate regional wall motion. Left ventricular  diastolic parameters are consistent  with Grade III diastolic dysfunction (restrictive). Elevated left atrial  pressure.  2. Right ventricular systolic function is normal. The right ventricular  size is normal. Tricuspid regurgitation signal is inadequate for assessing  PA pressure.  3. The mitral valve is grossly normal. Trivial mitral valve  regurgitation.  4. The aortic valve was not well visualized. Aortic valve regurgitation  is not visualized. No aortic stenosis is present.  5. The inferior vena cava is normal in size with greater than 50%  respiratory variability, suggesting right atrial pressure of 3 mmHg. __________  Carlton Adam MPI 02/2020: Pharmacological myocardial perfusion imaging study with no significant ischemia Normal wall motion, EF estimated at 76% No EKG changes concerning for ischemia at peak stress or in recovery. Resting EKG with PVCs Attenuation correction images with aortic atherosclerosis and coronary  calcification Low risk scan   EKG:  EKG is ordered today.  The EKG ordered today demonstrates NSR, 66 bpm, rare PVC, baseline artifact, no acute ST-T changes  Recent Labs: 01/22/2020: ALT 10; Pro B Natriuretic peptide (BNP) 239.0 07/29/2020: BUN 14; Creatinine, Ser 0.87; Hemoglobin 12.6; Platelets 318; Potassium 4.6; Sodium 142; TSH 1.150  Recent Lipid Panel    Component Value Date/Time   CHOL 125 02/01/2019 0907   CHOL 136 06/29/2016  0945   TRIG 109.0 02/01/2019 0907   HDL 58.40 02/01/2019 0907   HDL 57 06/29/2016 0945   CHOLHDL 2 02/01/2019 0907   VLDL 21.8 02/01/2019 0907   LDLCALC 45 02/01/2019 0907   LDLCALC 55 06/29/2016 0945   LDLDIRECT 139.1 04/11/2012 1131    PHYSICAL EXAM:    VS:  BP (!) 182/70 (BP Location: Left Arm, Patient Position: Sitting, Cuff Size: Normal)   Pulse 66   Ht 5\' 2"  (1.575 m)   Wt 144 lb (65.3 kg)   SpO2 97%   BMI 26.34 kg/m   BMI: Body mass index is 26.34 kg/m.  Physical Exam Vitals reviewed.  Constitutional:      Appearance: She is well-developed.  HENT:     Head: Normocephalic and atraumatic.  Eyes:     General:        Right eye: No discharge.        Left eye: No discharge.  Neck:     Vascular: No JVD.  Cardiovascular:     Rate and Rhythm: Normal rate and regular rhythm.     Pulses: No midsystolic click and no opening snap.          Carotid pulses are on the right side with bruit and on the left side with bruit.      Posterior tibial pulses are 2+ on the right side and 2+ on the left side.     Heart sounds: Normal heart sounds, S1 normal and S2 normal. Heart sounds not distant. No murmur heard. No friction rub.  Pulmonary:     Effort: Pulmonary effort is normal. No respiratory distress.     Breath sounds: Normal breath sounds. No decreased breath sounds, wheezing or rales.  Chest:     Chest wall: No tenderness.  Abdominal:     General: There is no distension.     Palpations: Abdomen is soft.     Tenderness: There is no  abdominal tenderness.  Musculoskeletal:     Cervical back: Normal range of motion.  Skin:    General: Skin is warm and dry.     Nails: There is no clubbing.  Neurological:     Mental Status: She is alert and oriented to person, place, and time.  Psychiatric:        Speech: Speech normal.        Behavior: Behavior normal.        Thought Content: Thought content normal.        Judgment: Judgment normal.     Wt Readings from Last 3 Encounters:  01/08/21 144 lb (65.3 kg)  11/21/20 143 lb (64.9 kg)  11/11/20 144 lb (65.3 kg)    ReDs vest: 39%  ASSESSMENT & PLAN:   1. HTN: Blood pressure elevated 182/70 in the office today with home readings typically in the 120s to 140s following carvedilol and frequently in the 160s to 190s early in the morning before medication.  Historically, we have allowed for somewhat of a permissive BP given issues with labile hypertension, chronic dizziness, and orthostasis.  However, now with elevated BPs, and in the setting of her diastolic CHF she has become symptomatic and mildly volume overloaded.  We will add amlodipine 5 mg nightly in an effort to minimize elevated BP readings overnight and in the early morning hours with noted improvement in BP and symptoms following morning carvedilol.  We will also increase her Lasix temporarily as outlined below.  She will continue carvedilol 3.125 mg twice daily.  Low-sodium diet recommended.  2. Acute on chronic HFpEF: Volume overload in the setting of elevated BP.  Reds vest 39% in the office today.  Optimize antihypertensive therapy as outlined above.  Increase Lasix to 40 mg daily for 3 days followed by 20 mg daily thereafter.  Continue KCl repletion.  Check BNP and BMP.  Check echo.  3. CAD s/p CABG without angina: No chest pain.  Suspect her underlying chest tightness/dyspnea are related to volume overload given recent [place] MPI it was without evidence of ischemia.  Continue secondary prevention with aspirin,  carvedilol, Zetia, and Repatha.  4. Chronic dizziness: Longstanding issue and stable.  We will need to continue to allow some degree of permissive hypertension.  Continue adequate hydration.  5. HLD: LDL 45 in 2020.  She remains on Zetia and Repatha.  She is intolerant to statins.  6. Carotid artery disease: 1 to 39% stenosis bilaterally by recent ultrasound.  Followed by vascular surgery.  She remains on lipid therapy as outlined above.   Disposition: F/u with Dr. Rockey Situ or an APP in 2 weeks.   Medication Adjustments/Labs and Tests Ordered: Current medicines are reviewed at length with the patient today.  Concerns regarding medicines are outlined above. Medication changes, Labs and Tests ordered today are summarized above and listed in the Patient Instructions accessible in Encounters.   Signed, Christell Faith, PA-C 01/08/2021 4:46 PM     [place], [place] [address] [phone]

## 2021-01-08 ENCOUNTER — Encounter: Payer: Self-pay | Admitting: Physician Assistant

## 2021-01-08 ENCOUNTER — Ambulatory Visit: Payer: Medicare HMO | Admitting: Physician Assistant

## 2021-01-08 ENCOUNTER — Other Ambulatory Visit: Payer: Self-pay

## 2021-01-08 VITALS — BP 182/70 | HR 66 | Ht 62.0 in | Wt 144.0 lb

## 2021-01-08 DIAGNOSIS — E785 Hyperlipidemia, unspecified: Secondary | ICD-10-CM

## 2021-01-08 DIAGNOSIS — R42 Dizziness and giddiness: Secondary | ICD-10-CM

## 2021-01-08 DIAGNOSIS — I2510 Atherosclerotic heart disease of native coronary artery without angina pectoris: Secondary | ICD-10-CM | POA: Diagnosis not present

## 2021-01-08 DIAGNOSIS — I10 Essential (primary) hypertension: Secondary | ICD-10-CM

## 2021-01-08 DIAGNOSIS — I6523 Occlusion and stenosis of bilateral carotid arteries: Secondary | ICD-10-CM | POA: Diagnosis not present

## 2021-01-08 DIAGNOSIS — I5033 Acute on chronic diastolic (congestive) heart failure: Secondary | ICD-10-CM

## 2021-01-08 DIAGNOSIS — Z951 Presence of aortocoronary bypass graft: Secondary | ICD-10-CM | POA: Diagnosis not present

## 2021-01-08 DIAGNOSIS — I5032 Chronic diastolic (congestive) heart failure: Secondary | ICD-10-CM | POA: Diagnosis not present

## 2021-01-08 MED ORDER — AMLODIPINE BESYLATE 5 MG PO TABS: 5.0000 mg | ORAL_TABLET | Freq: Every day | ORAL | 0 refills | Status: DC

## 2021-01-08 NOTE — Patient Instructions (Addendum)
Medication Instructions:  Your physician has recommended you make the following change in your medication:   1. START Amlodipine 5 mg daily at bedtime 2. INCREASE Furosemide to 40 mg once daily for 3 days THEN decrease down to 20 mg once daily  *If you need a refill on your cardiac medications before your next appointment, please call your pharmacy*   Lab Work: BMET, BNP today  If you have labs (blood work) drawn today and your tests are completely normal, you will receive your results only by: Marland Kitchen MyChart Message (if you have MyChart) OR . A paper copy in the mail If you have any lab test that is abnormal or we need to change your treatment, we will call you to review the results.   Testing/Procedures: Your physician has requested that you have an echocardiogram.   Echocardiography is a painless test that uses sound waves to create images of your heart. It provides your doctor with information about the size and shape of your heart and how well your heart's chambers and valves are working. This procedure takes approximately one hour. There are no restrictions for this procedure.     Follow-Up: At [facility], you and your health needs are our priority.  As part of our continuing mission to provide you with exceptional heart care, we have created designated Provider Care Teams.  These Care Teams include your primary Cardiologist (physician) and Advanced Practice Providers (APPs -  Physician Assistants and Nurse Practitioners) who all work together to provide you with the care you need, when you need it.   Your next appointment:   After your echocardiogram has been done.  The format for your next appointment:   In Person  Provider:   You may see Ida Rogue, MD or one of the following Advanced Practice Providers on your designated Care Team:    Murray Hodgkins, NP  Christell Faith, PA-C  Marrianne Mood, PA-C  Cadence [place], [place]  Laurann Montana, NP

## 2021-01-09 LAB — BASIC METABOLIC PANEL
BUN/Creatinine Ratio: 16 (ref 12–28)
BUN: 15 mg/dL (ref 8–27)
CO2: 24 mmol/L (ref 20–29)
Calcium: 9.4 mg/dL (ref 8.7–10.3)
Chloride: 101 mmol/L (ref 96–106)
Creatinine, Ser: 0.95 mg/dL (ref 0.57–1.00)
Glucose: 88 mg/dL (ref 65–99)
Potassium: 4.5 mmol/L (ref 3.5–5.2)
Sodium: 140 mmol/L (ref 134–144)
eGFR: 61 mL/min/{1.73_m2} (ref >59)

## 2021-01-09 LAB — BRAIN NATRIURETIC PEPTIDE: BNP: 163.6 pg/mL — ABNORMAL HIGH (ref 0.0–100.0)

## 2021-01-10 ENCOUNTER — Other Ambulatory Visit: Payer: Self-pay

## 2021-01-10 ENCOUNTER — Ambulatory Visit (INDEPENDENT_AMBULATORY_CARE_PROVIDER_SITE_OTHER): Payer: Medicare HMO

## 2021-01-10 DIAGNOSIS — I10 Essential (primary) hypertension: Secondary | ICD-10-CM

## 2021-01-10 DIAGNOSIS — R42 Dizziness and giddiness: Secondary | ICD-10-CM | POA: Diagnosis not present

## 2021-01-10 LAB — ECHOCARDIOGRAM COMPLETE
AR max vel: 1.47 cm2
AV Area VTI: 1.42 cm2
AV Area mean vel: 1.53 cm2
AV Mean grad: 5 mmHg
AV Peak grad: 7.8 mmHg
Ao pk vel: 1.4 m/s
Area-P 1/2: 3.87 cm2
S' Lateral: 3.8 cm

## 2021-01-10 MED ORDER — PERFLUTREN LIPID MICROSPHERE
1.0000 mL | INTRAVENOUS | Status: AC | PRN
  Administered 2021-01-10: 2 mL via INTRAVENOUS

## 2021-01-10 NOTE — Progress Notes (Deleted)
Cardiology Office Note    Date:  01/10/2021   ID:  Catherine Green, DOB 11/03/1940, MRN [id number]  PCP:  Tonia Ghent, MD  Cardiologist:  Ida Rogue, MD  Electrophysiologist:  None   Chief Complaint: Follow up  History of Present Illness:   Catherine Green is a 80 y.o. female with history of ***  ***   Labs independently reviewed: 12/2020 - BNP 163, potassium 4.5, BUN 15, serum creatinine 0.95 07/2020 - TSH normal, Hgb 12.6, PLT 318 12/2019-albumin 4.4, AST/ALT normal 10/2019-A1c 5.8 01/2019-TC 125, TG 109, HDL 58, LDL 45  Past Medical History:  Diagnosis Date  . Allergy    hay fever  . Anemia   . Anxiety   . Back pain   . Carotid artery occlusion   . Cerebrovascular disease    extracranial; occlusive  . Chicken pox   . Coronary artery disease   . Depression   . Dizziness   . Dizziness   . DVT (deep venous thrombosis) ([place])   . Fainting spell   . GERD (gastroesophageal reflux disease)   . Headache   . Heart murmur   . Hyperlipidemia   . Hypertension   . Leg pain   . PONV (postoperative nausea and vomiting)    severe nausea and vomiting  . Pre-syncope   . PVD (peripheral vascular disease) ([place])    endarterectomy by [name]  . Seasonal allergies   . Shortness of breath dyspnea    wth ambulation at times  . Swelling of both ankles    and abdomen; takes Lasix when needed  . Thrombophlebitis    following childbirth  . Ulcer     Past Surgical History:  Procedure Laterality Date  . APPENDECTOMY    . BACK SURGERY  01/14/16  . BREAST SURGERY     saline implants  . CARDIAC CATHETERIZATION    . CAROTID ENDARTERECTOMY  1992  . CAROTID ENDARTERECTOMY Right February 13, 2005   Re-do Right CE  . CATARACT EXTRACTION, BILATERAL  2013  . CHOLECYSTECTOMY    . CHOLECYSTECTOMY, LAPAROSCOPIC  04/02/14   Dr. Rochel Brome  . CORONARY ARTERY BYPASS GRAFT  2006   x3 Dr. Prescott Gum  . ENDARTERECTOMY Left 07/20/2014   Procedure: ENDARTERECTOMY CAROTID-LEFT;   Surgeon: Rosetta Posner, MD;  Location: [place];  Service: Vascular;  Laterality: Left;  . EYE SURGERY Bilateral Feb. 2013   Cataract Left eye  . SEPTOPLASTY     with bilateral inferior turbinate reductions  . SPINE SURGERY  12/2015  . TOTAL ABDOMINAL HYSTERECTOMY    . UPPER GASTROINTESTINAL ENDOSCOPY     had polyps removed from esophagus  . UPPER GI ENDOSCOPY      Current Medications: No outpatient medications have been marked as taking for the 01/13/21 encounter (Appointment) with Rise Mu, PA-C.    Allergies:   Lyrica [pregabalin], Hydrocodone, Ivp dye [iodinated diagnostic agents], Lipitor [atorvastatin], Oxycodone hcl, Penicillin g, Shellfish allergy, Clavulanic acid, Prednisone, Augmentin [amoxicillin-pot clavulanate], Codeine, Crestor [rosuvastatin calcium], Cymbalta [duloxetine hcl], Demerol, Doxycycline, Effexor [venlafaxine], Hydrochlorothiazide, Hydroxyzine, Metoprolol, Protonix [pantoprazole sodium], and Trazodone and nefazodone   Social History   Socioeconomic History  . Marital status: Married    Spouse name: Catherine Green   . Number of children: 1  . Years of education: Not on file  . Highest education level: 11th grade  Occupational History    Comment: Retired   Tobacco Use  . Smoking status: Former Smoker  Packs/day: 0.30    Years: 15.00    Pack years: 4.50    Types: Cigarettes    Quit date: 08/31/1996    Years since quitting: 24.3  . Smokeless tobacco: Never Used  Vaping Use  . Vaping Use: Never used  Substance and Sexual Activity  . Alcohol use: No    Alcohol/week: 0.0 standard drinks  . Drug use: No  . Sexual activity: Not Currently  Other Topics Concern  . Not on file  Social History Narrative   Lives with husband   Work - retired Emergency planning/management officer   Diet - healthy   Right handed    Caffeine- 1 cup per day.   Social Determinants of Health   Financial Resource Strain: Not on file  Food Insecurity: Not on file  Transportation Needs: Not on file  Physical  Activity: Not on file  Stress: Not on file  Social Connections: Not on file     Family History:  The patient's family history includes Aneurysm in her brother and mother; Birth defects in her brother; Bleeding Disorder in her mother and sister; Cancer in her sister; Cirrhosis in her father; Deep vein thrombosis in her brother; Heart attack in her father; Heart disease in her brother, father, mother, and sister; Hyperlipidemia in her brother, daughter, father, mother, and sister; Hypertension in her brother, daughter, father, mother, sister, and another family member; Peripheral vascular disease in her daughter; Varicose Veins in her mother and sister. There is no history of Breast cancer or Colon cancer.  ROS:   ROS   EKGs/Labs/Other Studies Reviewed:    Studies reviewed were summarized above. The additional studies were reviewed today:  2D echo 11/2019: 1. Left ventricular ejection fraction, by estimation, is 65 to 70%. The  left ventricle has normal function. Left ventricular endocardial border  not optimally defined to evaluate regional wall motion. Left ventricular  diastolic parameters are consistent  with Grade III diastolic dysfunction (restrictive). Elevated left atrial  pressure.  2. Right ventricular systolic function is normal. The right ventricular  size is normal. Tricuspid regurgitation signal is inadequate for assessing  PA pressure.  3. The mitral valve is grossly normal. Trivial mitral valve  regurgitation.  4. The aortic valve was not well visualized. Aortic valve regurgitation  is not visualized. No aortic stenosis is present.  5. The inferior vena cava is normal in size with greater than 50%  respiratory variability, suggesting right atrial pressure of 3 mmHg. __________  Carlton Adam MPI 02/2020: Pharmacological myocardial perfusion imaging study with no significant ischemia Normal wall motion, EF estimated at 76% No EKG changes concerning for ischemia at  peak stress or in recovery. Resting EKG with PVCs Attenuation correction images with aortic atherosclerosis and coronary calcification Low risk scan __________  2D echo 01/10/2021: 1. Left ventricular ejection fraction, by estimation, is 60 to 65%. The  left ventricle has normal function. The left ventricle has no regional  wall motion abnormalities. Left ventricular diastolic parameters are  indeterminate.  2. Right ventricular systolic function is normal. The right ventricular  size is normal. Tricuspid regurgitation signal is inadequate for assessing  PA pressure.  3. The mitral valve was not well visualized. Moderate mitral valve  Regurgitation.   EKG:  EKG is ordered today.  The EKG ordered today demonstrates ***  Recent Labs: 01/22/2020: ALT 10; Pro B Natriuretic peptide (BNP) 239.0 07/29/2020: Hemoglobin 12.6; Platelets 318; TSH 1.150 01/08/2021: BNP 163.6; BUN 15; Creatinine, Ser 0.95; Potassium 4.5; Sodium 140  Recent  Lipid Panel    Component Value Date/Time   CHOL 125 02/01/2019 0907   CHOL 136 06/29/2016 0945   TRIG 109.0 02/01/2019 0907   HDL 58.40 02/01/2019 0907   HDL 57 06/29/2016 0945   CHOLHDL 2 02/01/2019 0907   VLDL 21.8 02/01/2019 0907   LDLCALC 45 02/01/2019 0907   LDLCALC 55 06/29/2016 0945   LDLDIRECT 139.1 04/11/2012 1131    PHYSICAL EXAM:    VS:  There were no vitals taken for this visit.  BMI: There is no height or weight on file to calculate BMI.  Physical Exam  Wt Readings from Last 3 Encounters:  01/08/21 144 lb (65.3 kg)  11/21/20 143 lb (64.9 kg)  11/11/20 144 lb (65.3 kg)     ASSESSMENT & PLAN:   1. ***  Disposition: F/u with Dr. Rockey Situ or an APP in ***.   Medication Adjustments/Labs and Tests Ordered: Current medicines are reviewed at length with the patient today.  Concerns regarding medicines are outlined above. Medication changes, Labs and Tests ordered today are summarized above and listed in the Patient Instructions  accessible in Encounters.   Signed, Christell Faith, PA-C 01/10/2021 4:47 PM     [address] [place], [place] [address] [phone]

## 2021-01-13 ENCOUNTER — Ambulatory Visit: Payer: Medicare HMO | Admitting: Physician Assistant

## 2021-01-13 ENCOUNTER — Ambulatory Visit: Payer: Medicare HMO | Admitting: Family

## 2021-01-14 ENCOUNTER — Ambulatory Visit: Payer: Medicare HMO | Admitting: Family

## 2021-01-14 ENCOUNTER — Other Ambulatory Visit: Payer: Self-pay

## 2021-01-14 ENCOUNTER — Encounter: Payer: Self-pay | Admitting: Family

## 2021-01-14 ENCOUNTER — Ambulatory Visit (INDEPENDENT_AMBULATORY_CARE_PROVIDER_SITE_OTHER): Payer: Medicare HMO

## 2021-01-14 VITALS — BP 170/64 | HR 63 | Ht 62.0 in | Wt 142.6 lb

## 2021-01-14 DIAGNOSIS — R002 Palpitations: Secondary | ICD-10-CM

## 2021-01-14 DIAGNOSIS — R42 Dizziness and giddiness: Secondary | ICD-10-CM

## 2021-01-14 DIAGNOSIS — I5032 Chronic diastolic (congestive) heart failure: Secondary | ICD-10-CM

## 2021-01-14 DIAGNOSIS — R0609 Other forms of dyspnea: Secondary | ICD-10-CM

## 2021-01-14 DIAGNOSIS — I10 Essential (primary) hypertension: Secondary | ICD-10-CM

## 2021-01-14 DIAGNOSIS — R0600 Dyspnea, unspecified: Secondary | ICD-10-CM

## 2021-01-14 DIAGNOSIS — I951 Orthostatic hypotension: Secondary | ICD-10-CM | POA: Diagnosis not present

## 2021-01-14 MED ORDER — POTASSIUM CHLORIDE ER 10 MEQ PO TBCR: EXTENDED_RELEASE_TABLET | ORAL | 0 refills | Status: DC

## 2021-01-14 MED ORDER — FUROSEMIDE 20 MG PO TABS: ORAL_TABLET | ORAL | 0 refills | Status: DC

## 2021-01-14 NOTE — Progress Notes (Signed)
Office Visit    Patient Name: Catherine Green Date of Encounter: 01/14/2021  PCP:  Tonia Ghent, MD   [place]  Cardiologist:  Ida Rogue, MD  Advanced Practice Provider:  No care team member to display Electrophysiologist:  None    Chief Complaint    Catherine Green is a 80 y.o. female with a hx of CAD s/p CABGx3 2006, HFpEF, bilateral carotid artery disease s/p bilateral CEA followed by vascular surgery, HTN, HLD, multiple medication intolerances, back pain s/p surgery, prior tobacco use (start age 7, quite age 55)  presents today for follow up after echo.   Past Medical History    Past Medical History:  Diagnosis Date  . Allergy    hay fever  . Anemia   . Anxiety   . Back pain   . Carotid artery occlusion   . Cerebrovascular disease    extracranial; occlusive  . Chicken pox   . Coronary artery disease   . Depression   . Dizziness   . Dizziness   . DVT (deep venous thrombosis) ([place])   . Fainting spell   . GERD (gastroesophageal reflux disease)   . Headache   . Heart murmur   . Hyperlipidemia   . Hypertension   . Leg pain   . PONV (postoperative nausea and vomiting)    severe nausea and vomiting  . Pre-syncope   . PVD (peripheral vascular disease) ([place])    endarterectomy by [name]  . Seasonal allergies   . Shortness of breath dyspnea    wth ambulation at times  . Swelling of both ankles    and abdomen; takes Lasix when needed  . Thrombophlebitis    following childbirth  . Ulcer    Past Surgical History:  Procedure Laterality Date  . APPENDECTOMY    . BACK SURGERY  01/14/16  . BREAST SURGERY     saline implants  . CARDIAC CATHETERIZATION    . CAROTID ENDARTERECTOMY  1992  . CAROTID ENDARTERECTOMY Right February 13, 2005   Re-do Right CE  . CATARACT EXTRACTION, BILATERAL  2013  . CHOLECYSTECTOMY    . CHOLECYSTECTOMY, LAPAROSCOPIC  04/02/14   Dr. Rochel Brome  . CORONARY ARTERY BYPASS GRAFT  2006   x3 Dr. Prescott Gum   . ENDARTERECTOMY Left 07/20/2014   Procedure: ENDARTERECTOMY CAROTID-LEFT;  Surgeon: Rosetta Posner, MD;  Location: [facility];  Service: Vascular;  Laterality: Left;  . EYE SURGERY Bilateral Feb. 2013   Cataract Left eye  . SEPTOPLASTY     with bilateral inferior turbinate reductions  . SPINE SURGERY  12/2015  . TOTAL ABDOMINAL HYSTERECTOMY    . UPPER GASTROINTESTINAL ENDOSCOPY     had polyps removed from esophagus  . UPPER GI ENDOSCOPY      Allergies  Allergies  Allergen Reactions  . Lyrica [Pregabalin] Shortness Of Breath, Swelling and Other (See Comments)    chest tight  . Hydrocodone Other (See Comments)    hallucinate  . Ivp Dye [Iodinated Diagnostic Agents] Hives and Itching       . Lipitor [Atorvastatin] Nausea And Vomiting  . Oxycodone Hcl Other (See Comments)    Hallucinations   . Penicillin G Nausea And Vomiting  . Shellfish Allergy Hives and Itching  . Clavulanic Acid   . Prednisone   . Augmentin [Amoxicillin-Pot Clavulanate] Nausea And Vomiting  . Codeine Nausea And Vomiting and Other (See Comments)  . Crestor [Rosuvastatin Calcium] Other (See Comments)  myalgias  . Cymbalta [Duloxetine Hcl] Nausea Only    GI upset at 30mg   . Demerol Nausea And Vomiting  . Doxycycline Nausea Only and Other (See Comments)    Weakness, sick to her stomach  . Effexor [Venlafaxine] Nausea Only  . Hydrochlorothiazide Other (See Comments)    Not an allergy but urinary frequency and sweating at 25mg   . Hydroxyzine Other (See Comments)    Excessive sweating.   . Metoprolol Other (See Comments)    Slowed body down, per pt   . Protonix [Pantoprazole Sodium] Nausea Only  . Trazodone And Nefazodone Other (See Comments)    Sedation     History of Present Illness    Catherine Green is a 80 y.o. female with a hx of CAD s/p CABGx3 2006, HFpEF, bilateral carotid artery disease s/p bilateral CEA followed by vascular surgery, HTN, HLD, multiple medication intolerances, back pain s/p  surgery, prior tobacco use (start age 41, quite age 60) last seen 01/08/21.  Most recent cardiac catheterization 09/2007 with atretic LIMA-LAD and patent vein grafts.  She was seen by PCP 10/2019 with dyspnea and BNP mildly elevated. Lasix 40mg  daily was started. Echo 11/2019 with hyperdynamis LVEF 65-70%, gr3Dd, RV normal systolic function, RV normal cavity size, trivial MR. Small pleural effusion noted. Recheck by PCP 12/2019 weight down 6 lbs though continued bilateral pedal edema and abdominal bloating. Lower extremity edema was thought to be exacerbated by venous insufficiency.  She was seen 01/2020 with 1 week increased diuretic dose with improvement in volume status. However, due to concern for symptoms similar to anginal equivalent she underwent Lexiscan MPI 03/15/20 with no significant ischemia, normal wall motion, EF 76%, attenuation corrected images with aortic atherosclerosis and coronary artery calcifications, low risk scan. 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 In subsequent clinic visits her Lasix was reduced, Coreg decreased and eventually held, and Imdur discontinued due to orthostasis. She had renal artery ultrasound with no renal artery stenosis. Seen in clinic 07/2020 with some improvement in dizziness home BP [id number] systolic with heart rate routinely in the 60s bpm. Carotid artery ultrasound 07/2020 with 1-39% bilateral ICA stenosis, antegrade flow along the vertebral arteries bilaterally, normal flow hemodynamics in bilateral subclavian arteries. Bilateral ABIs were normal 10/2020.   She was seen 10/2020 doing overall well with improvement in dizziness with permissive hypertension. She was frequently taking PRN Carvedilol and it was transitioned to Carvedilol 3.125mg  BID.    Seen 01/08/21 by Sherron Ales, PA noting elevated BP readings [id number] systolic with improvement to [id number] systolic after Coreg. However, elevated BP was associated with shortness of breath and chest tightness. She was noted to be volume overloaded and Lasix was increased for 3 days to 40mg  then return to 20mg . Amlodipine 5mg  QHS was added for antihypertensive benefit. Subsequent echo 01/10/21 with LVEF 60-65%, no RWMA, indeterminite diastolic parameters, RV normal size and function, moderate MR.   She presents today for follow up.  She notes persistent dizziness which she describes as a lightheadedness both sitting and with activity.  She notes her abdomen continues to swell.  No lower extremity edema.  She is very anxious related to her cardiac conditions and breathing.  She notes her dyspnea on exertion seems to be worse.  Notes orthopnea but no PND.  It did improve some on her increased diuretic  course at last clinic visit.  We reviewed her echocardiogram in detail.  She denies chest pain, pressure, tightness.  She has some intermittent left elbow tingling which we discussed is likely related to nerve and she was encouraged to follow-up with primary care provider.  She notes no chest pain, pressure, tightness.  EKGs/Labs/Other Studies Reviewed:   The following studies were reviewed today:  Echo 01/10/21 1. Left ventricular ejection fraction, by estimation, is 60 to 65%. The  left ventricle has normal function. The left ventricle has no regional  wall motion abnormalities. Left ventricular diastolic parameters are  indeterminate.   2. Right ventricular systolic function is normal. The right ventricular  size is normal. Tricuspid regurgitation signal is inadequate for assessing  PA pressure.   3. The mitral valve was not well visualized. Moderate mitral valve  regurgitation.  _____________________   2D echo 11/2019: 1. Left ventricular ejection fraction, by estimation, is 65 to 70%. The   left ventricle has normal function. Left ventricular endocardial border  not optimally defined to evaluate regional wall motion. Left ventricular  diastolic parameters are consistent  with Grade III diastolic dysfunction (restrictive). Elevated left atrial  pressure.   2. Right ventricular systolic function is normal. The right ventricular  size is normal. Tricuspid regurgitation signal is inadequate for assessing  PA pressure.   3. The mitral valve is grossly normal. Trivial mitral valve  regurgitation.   4. The aortic valve was not well visualized. Aortic valve regurgitation  is not visualized. No aortic stenosis is present.   5. The inferior vena cava is normal in size with greater than 50%  respiratory variability, suggesting right atrial pressure of 3 mmHg. __________________   Carlton Adam MPI 02/2020: Pharmacological myocardial perfusion imaging study with no significant  ischemia Normal wall motion, EF estimated at 76% No EKG changes concerning for ischemia at peak stress or in recovery. Resting EKG with PVCs Attenuation correction images with aortic atherosclerosis and coronary calcification Low risk scan  EKG:  EKG is  ordered today.  The ekg ordered today demonstrates normal sinus rhythm 63 bpm with no acute ST/T wave changes.  Recent Labs: 01/22/2020: ALT 10; Pro B Natriuretic peptide (BNP) 239.0 07/29/2020: Hemoglobin 12.6; Platelets 318; TSH 1.150 01/08/2021: BNP 163.6; BUN 15; Creatinine, Ser 0.95; Potassium 4.5; Sodium 140  Recent Lipid Panel    Component Value Date/Time   CHOL 125 02/01/2019 0907   CHOL 136 06/29/2016 0945   TRIG 109.0 02/01/2019 0907   HDL 58.40 02/01/2019 0907   HDL 57 06/29/2016 0945   CHOLHDL 2 02/01/2019 0907   VLDL 21.8 02/01/2019 0907   LDLCALC 45 02/01/2019 0907   LDLCALC 55 06/29/2016 0945   LDLDIRECT 139.1 04/11/2012 1131   Home Medications   Current Meds  Medication Sig  . amLODipine (NORVASC) 5 MG tablet Take 1 tablet (5 mg  total) by mouth at bedtime.  Marland Kitchen aspirin 81 MG tablet Take 1 tablet (81 mg total) by mouth daily.  . carvedilol (COREG) 3.125 MG tablet Take 1 tablet (3.125 mg total) by mouth 2 (two) times daily.  . Coenzyme Q10 (CO Q10) 100 MG CAPS Take 100 mg by mouth daily.  . Evolocumab (REPATHA SURECLICK) 784 MG/ML SOAJ Inject 140 mg into the skin every 14 (fourteen) days.  Marland Kitchen ezetimibe (ZETIA) 10 MG tablet TAKE ONE TABLET EVERY DAY  . fluticasone (FLONASE) 50 MCG/ACT nasal spray Place 1 spray into both nostrils daily.  . furosemide (LASIX) 20 MG tablet Take 2 tablets (40  mg total) by mouth every other day for 30 days, THEN 1 tablet (20 mg total) every other day.  . gabapentin (NEURONTIN) 400 MG capsule Take 400 mg by mouth every 8 (eight) hours.  Marland Kitchen loratadine (CLARITIN) 10 MG tablet Take 10 mg by mouth daily.  Marland Kitchen omeprazole (PRILOSEC) 20 MG capsule Take 1 capsule (20 mg total) by mouth daily.  . sertraline (ZOLOFT) 100 MG tablet TAKE ONE TABLET BY MOUTH EVERY DAY  . traMADol (ULTRAM) 50 MG tablet Take 1 tablet (50 mg total) by mouth in the morning.  . vitamin B-12 (CYANOCOBALAMIN) 1000 MCG tablet Take 1 tablet (1,000 mcg total) by mouth daily.  . [DISCONTINUED] potassium chloride (KLOR-CON) 10 MEQ tablet Take 1 tablet (10 mEq total) by mouth daily.     Review of Systems  All other systems reviewed and are otherwise negative except as noted above.  Physical Exam    VS:  BP (!) 170/64   Pulse 63   Ht 5\' 2"  (1.575 m)   Wt 142 lb 9.6 oz (64.7 kg)   SpO2 98%   BMI 26.08 kg/m  , BMI Body mass index is 26.08 kg/m.  Wt Readings from Last 3 Encounters:  01/14/21 142 lb 9.6 oz (64.7 kg)  01/08/21 144 lb (65.3 kg)  11/21/20 143 lb (64.9 kg)    GEN: Well nourished, well developed, in no acute distress. HEENT: normal. Neck: Supple, no JVD, carotid bruits, or masses. Cardiac: RRR, no murmurs, rubs, or gallops. No clubbing, cyanosis, edema.  Radials/PT 2+ and equal bilaterally.  Respiratory:  Respirations  regular and unlabored, clear to auscultation bilaterally. GI: Soft, nontender. Abdomen with some bloating though no pitting edema. MS: No deformity or atrophy. Skin: Warm and dry, no rash. Neuro:  Strength and sensation are intact. Anxious. Psych: Normal affect.  Assessment & Plan    1. HTN / Orthostatic hypotension -longstanding history of orthostatic hypotension.  Orthostatic vital signs today detailed as above.  Recommend some element of permissive hypertension to prevent significant hypotension as she is symptomatic with lightheadedness, dizziness.  She politely declines compression stockings as she tells me they do not help.  She encouraged to continue regular meals and slow position changes.  We will continue her present antihypertensive regimen of Coreg 3.25 mg twice daily, amlodipine 5 mg daily.  2. HFpEF - Down 2 lbs from clinic visit last week. Recent echo with normal LVEF and diastolic function unable to be determined.  Anticipate diastolic heart failure contributory to majority of her abdominal bloating, dyspnea. She is previously intolerant to Lasix 40 mg daily due to hypotension though 20mg  does not seem to offer enough clinical benefit. Change to Lasix alternate 40mg  and 20mg  QOD with Potassium 54mEq and 10 mEq QOD. BMP, BNP, CBC today. Prompt follow up in one week to reassess.   3. CAD s/p CABG - No chest pain, pressure, tightness.  EKG today no acute ST/T changes.  No indication for ischemic evaluation at this time.  GDMT includes aspirin, beta-blocker, Zetia.  No statin due to previous intolerance. Heart healthy diet and regular cardiovascular exercise encouraged.   4. Chronic dizziness / Lightheadedness / Palpitations -orthostatic hypotension likely contributory.  Plan for 14-day ZIO monitor to rule out arrhythmia as contributory she also notes occasional palpitations.  5. HLD, LDL goal <70 - Continue Zetia 10 mg daily.  Multiple medication intolerances including myalgias with  multiple statins.  Lipid-lowering diet encouraged.  6. Carotid artery disease -s/p bilateral CEA.  Follows closely with  vascular surgery with most recent carotid duplex 07/2020.  Disposition: Follow up in 1 week(s) with Christell Faith, PA as scheduled.  Signed, Loel Dubonnet, NP 01/14/2021, 5:07 PM [place]

## 2021-01-14 NOTE — Patient Instructions (Addendum)
Medication Instructions:  Your physician has recommended you make the following change in your medication:   CHANGE Furosemide (Lasix) to alternate 20mg  and 40mg  every other day  CHANGE Potassium to alternate 57mEq and 65mEq every other day  *If you need a refill on your cardiac medications before your next appointment, please call your pharmacy*   Lab Work: Your provider recommends that you return for lab work today: CBC, BMP, BNP  If you have labs (blood work) drawn today and your tests are completely normal, you will receive your results only by: Mar[facility] MyChart Message (if you have MyChart) OR . A paper copy in the mail If you have any lab test that is abnormal or we need to change your treatment, we will call you to review the results.   Testing/Procedures: Your physician has recommended that you wear a Zio monitor for 2 weeks.   This monitor is a medical device that records the heart's electrical activity. Doctors most often use these monitors to diagnose arrhythmias. Arrhythmias are problems with the speed or rhythm of the heartbeat. The monitor is a small device applied to your chest. You can wear one while you do your normal daily activities. While wearing this monitor if you have any symptoms to push the button and record what you felt. Once you have worn this monitor for the period of time provider prescribed (Usually 14 days), you will return the monitor device in the postage paid box. Once it is returned they will download the data collected and provide [place] with a report which the provider will then review and we will call you with those results. Important tips:  1. Avoid showering during the first 24 hours of wearing the monitor. 2. Avoid excessive sweating to help maximize wear time. 3. Do not submerge the device, no hot tubs, and no swimming pools. 4. Keep any lotions or oils away from the patch. 5. After 24 hours you may shower with the patch on. Take brief showers with your  back facing the shower head.  6. Do not remove patch once it has been placed because that will interrupt data and decrease adhesive wear time. 7. Push the button when you have any symptoms and write down what you were feeling. 8. Once you have completed wearing your monitor, remove and place into box which has postage paid and place in your outgoing mailbox.  9. If for some reason you have misplaced your box then call our office and we can provide another box and/or mail it off for you.  Follow-Up: At [facility], you and your health needs are our priority.  As part of our continuing mission to provide you with exceptional heart care, we have created designated Provider Care Teams.  These Care Teams include your primary Cardiologist (physician) and Advanced Practice Providers (APPs -  Physician Assistants and Nurse Practitioners) who all work together to provide you with the care you need, when you need it.   Your next appointment:   As scheduled with Christell Faith PA-C on 01/21/21 at 09:30 AM   Other Instructions   Orthostatic Hypotension Blood pressure is a measurement of how strongly, or weakly, your blood is pressing against the walls of your arteries. Orthostatic hypotension is a sudden drop in blood pressure that happens when you quickly change positions, such as when you get up from sitting or lying down. Arteries are blood vessels that carry blood from your heart throughout your body. When blood pressure is too low,  you may not get enough blood to your brain or to the rest of your organs. This can cause weakness, light-headedness, rapid heartbeat, and fainting. This can last for just a few seconds or for up to a few minutes. Orthostatic hypotension is usually not a serious problem. However, if it happens frequently or gets worse, it may be a sign of something more serious. What are the causes? This condition may be caused by:  Sudden changes in posture, such as standing up quickly after  you have been sitting or lying down.  Blood loss.  Loss of body fluids (dehydration).  Heart problems.  Hormone (endocrine) problems.  Pregnancy.  Severe infection.  Lack of certain nutrients.  Severe allergic reactions (anaphylaxis).  Certain medicines, such as blood pressure medicine or medicines that make the body lose excess fluids (diuretics). Sometimes, this condition can be caused by not taking medicine as directed, such as taking too much of a certain medicine. What increases the risk? The following factors may make you more likely to develop this condition:  Age. Risk increases as you get older.  Conditions that affect the heart or the central nervous system.  Taking certain medicines, such as blood pressure medicine or diuretics.  Being pregnant. What are the signs or symptoms? Symptoms of this condition may include:  Weakness.  Light-headedness.  Dizziness.  Blurred vision.  Fatigue.  Rapid heartbeat.  Fainting, in severe cases. How is this diagnosed? This condition is diagnosed based on:  Your medical history.  Your symptoms.  Your blood pressure measurement. Your health care provider will check your blood pressure when you are: ? Lying down. ? Sitting. ? Standing. A blood pressure reading is recorded as two numbers, such as "120 over 80" (or 120/80). The first ("top") number is called the systolic pressure. It is a measure of the pressure in your arteries as your heart beats. The second ("bottom") number is called the diastolic pressure. It is a measure of the pressure in your arteries when your heart relaxes between beats. Blood pressure is measured in a unit called mm Hg. Healthy blood pressure for most adults is 120/80. If your blood pressure is below 90/60, you may be diagnosed with hypotension. Other information or tests that may be used to diagnose orthostatic hypotension include:  Your other vital signs, such as your heart rate and  temperature.  Blood tests.  Tilt table test. For this test, you will be safely secured to a table that moves you from a lying position to an upright position. Your heart rhythm and blood pressure will be monitored during the test. How is this treated? This condition may be treated by:  Changing your diet. This may involve eating more salt (sodium) or drinking more water.  Taking medicines to raise your blood pressure.  Changing the dosage of certain medicines you are taking that might be lowering your blood pressure.  Wearing compression stockings. These stockings help to prevent blood clots and reduce swelling in your legs. In some cases, you may need to go to the hospital for:  Fluid replacement. This means you will receive fluids through an IV.  Blood replacement. This means you will receive donated blood through an IV (transfusion).  Treating an infection or heart problems, if this applies.  Monitoring. You may need to be monitored while medicines that you are taking wear off. Follow these instructions at home: Eating and drinking  Drink enough fluid to keep your urine pale yellow.  Eat a healthy  diet, and follow instructions from your health care provider about eating or drinking restrictions. A healthy diet includes: ? Fresh fruits and vegetables. ? Whole grains. ? Lean meats. ? Low-fat dairy products.  Eat extra salt only as directed. Do not add extra salt to your diet unless your health care provider told you to do that.  Eat frequent, small meals.  Avoid standing up suddenly after eating.   Medicines  Take over-the-counter and prescription medicines only as told by your health care provider. ? Follow instructions from your health care provider about changing the dosage of your current medicines, if this applies. ? Do not stop or adjust any of your medicines on your own. General instructions  Wear compression stockings as told by your health care  provider.  Get up slowly from lying down or sitting positions. This gives your blood pressure a chance to adjust.  Avoid hot showers and excessive heat as directed by your health care provider.  Return to your normal activities as told by your health care provider. Ask your health care provider what activities are safe for you.  Do not use any products that contain nicotine or tobacco, such as cigarettes, e-cigarettes, and chewing tobacco. If you need help quitting, ask your health care provider.  Keep all follow-up visits as told by your health care provider. This is important.   Contact a health care provider if you:  Vomit.  Have diarrhea.  Have a fever for more than 2-3 days.  Feel more thirsty than usual.  Feel weak and tired. Get help right away if you:  Have chest pain.  Have a fast or irregular heartbeat.  Develop numbness in any part of your body.  Cannot move your arms or your legs.  Have trouble speaking.  Become sweaty or feel light-headed.  Faint.  Feel short of breath.  Have trouble staying awake.  Feel confused. Summary  Orthostatic hypotension is a sudden drop in blood pressure that happens when you quickly change positions.  Orthostatic hypotension is usually not a serious problem.  It is diagnosed by having your blood pressure taken lying down, sitting, and then standing.  It may be treated by changing your diet or adjusting your medicines. This information is not intended to replace advice given to you by your health care provider. Make sure you discuss any questions you have with your health care provider. Document Revised: 02/10/2018 Document Reviewed: 02/10/2018 Elsevier Patient Education  [place].

## 2021-01-15 LAB — CBC
Hematocrit: 36.8 % (ref 34.0–46.6)
Hemoglobin: 11.9 g/dL (ref 11.1–15.9)
MCH: 27 pg (ref 26.6–33.0)
MCHC: 32.3 g/dL (ref 31.5–35.7)
MCV: 84 fL (ref 79–97)
Platelets: 358 10*3/uL (ref 150–450)
RBC: 4.4 x10E6/uL (ref 3.77–5.28)
RDW: 13.9 % (ref 11.7–15.4)
WBC: 7 10*3/uL (ref 3.4–10.8)

## 2021-01-15 LAB — BRAIN NATRIURETIC PEPTIDE: BNP: 137.4 pg/mL — ABNORMAL HIGH (ref 0.0–100.0)

## 2021-01-15 LAB — BASIC METABOLIC PANEL
BUN/Creatinine Ratio: 16 (ref 12–28)
BUN: 18 mg/dL (ref 8–27)
CO2: 25 mmol/L (ref 20–29)
Calcium: 9.4 mg/dL (ref 8.7–10.3)
Chloride: 101 mmol/L (ref 96–106)
Creatinine, Ser: 1.16 mg/dL — ABNORMAL HIGH (ref 0.57–1.00)
Glucose: 90 mg/dL (ref 65–99)
Potassium: 4 mmol/L (ref 3.5–5.2)
Sodium: 142 mmol/L (ref 134–144)
eGFR: 48 mL/min/{1.73_m2} — ABNORMAL LOW (ref >59)

## 2021-01-20 NOTE — Progress Notes (Signed)
Cardiology Office Note    Date:  01/21/2021   ID:  DANNELL DERYKE, DOB 1940/12/23, MRN [id number]  PCP:  Tonia Ghent, MD  Cardiologist:  Ida Rogue, MD  Electrophysiologist:  None   Chief Complaint: Follow-up  History of Present Illness:   Catherine Green is a 80 y.o. female with history of CAD status post three-vessel CABG in 2006,HFpEF,bilateral carotid artery disease status post bilateral CEA followed by vascular surgery, HTN, HLD,multiple medication intolerances,back pain status post surgery, prior tobacco use starting at age 64 andquitting at age 63 who presents for follow up of hypertension/volume overload/dizziness.  Most recent cardiac cath from 1/2009demonstrated an atretic LIMA to LAD with patent vein grafts.  She was seen at her PCPs office in 10/2019 with dyspnea. BNP mildly elevated at 300.Her symptoms werefelt to be likely multifactorial including volume overload and mild longstanding anemia. She was started on Lasix 40 mg daily. Echo in 11/2019 showed a hyperdynamic LVSF with an EF of 65 to [id number], grade 3 diastolic dysfunction, normal RV systolic function and RV cavity size, and trivial mitral regurgitation. Unable to estimate right heart pressures, IVC not documented as being dilated. Small pleural effusion was noted. Following this echo she was taking Lasix 40 mg twice daily with continued swelling. Recheck at her PCPs office in late 5/2021demonstrated her weight was down 6 pounds, however she continued to note bilateral pedal edema and abdominal bloating as the day progressed. She continued to have some occasional dyspnea in the afternoons. Her appetite remained down. She was seen in the office on 02/06/2020 noting increased dyspnea, PND, 2-3 pillow orthopnea, and increased fatigue. BP was elevated in the [id number] to 123456 systolic. Her weight was down 8 pounds in the past 3 months with notes indicating she was missing meals. She reported nausea when eating  with associated dysphagia. Repeat BNP mildly improved at 239 with stable renal function. She was taking Lasix 40 mg daily. It was felt there was some component of venous insufficiency with her lower extremity swelling. It was recommended she increase her Lasix to 40 mg twice daily for 1 week followed by 40 mg daily thereafter.She was seen in 02/2020 noting improvement in her dyspnea following diuresis and hadcontinued to take Lasix 40 mg twicedaily since her visit in 01/2020. Her weight is down another 6 pounds when compared to her last clinic visit. She did note some positional dizziness and generalized fatigue. She was orthostatic in the office that day. Lasix was decreased to 40 mg daily. Symptoms felt similar to her presentation leading up to her bypass. In this setting, she underwent Lexiscan MPI on 03/15/2020 which showed no significant ischemia, normal wall motion, EF 76%, attenuation corrected images with aortic atherosclerosis and coronary artery calcifications, and was overall a low risk scan. EKG showed PVCs. CTA of the neck from 04/23/2020 showed no dissection or pseudoaneurysm without interval growth of remodeling/fusiform aneurysm within the right internal carotid artery, 30% left ICA stenosis, and 70% stenosis of the brachiocephalic origin. There was 40% subclavian artery stenosis. She was seen in the office on 04/29/2020 noting her dizziness persisted. Her weight was stable. She had further decreased her Lasix to 20 mg daily in the setting of ongoing dizziness. She denied any shortness of breath or lower extremity swelling. Orthostatic vital signs remained positive at that visit. Imdur was discontinued and carvedilol was decreased to 3.125 mg twice daily. Following this, she contacted our office in 05/2020, notingcontinued orthostasis,and in thissetting carvedilol washeld. She  was seen in the office on 05/29/2020, and continuedto note positional dizziness. She was no longer on  carvedilol or Imdur though was taking Lasix 40 mg daily. Home BP readings were predominantly in the 093G to 182X systolic.Her dizziness was noted to date back at least to 02/2018. She declined head CT. She was advised to follow-up with her PCP. It was again recommended her Lasix be decreased to 20 mg daily. She was hypertensive when lying for orthostatic BP with orthostatics continuing to be mildly positive. Renal artery ultrasound showed no evidence of renal artery stenosis. She was seen in the office in 07/2020 noting some improvement in her chronic dizziness. BP readings ranged from the 937J to 696 systolic with most heart rates in the 60s bpm. Carotid artery ultrasound in 07/2020, showed 1-39% bilateral ICA stenosis, antegrade flow along the vertebral arteries bilaterally, normal flow hemodynamics in the bilateral subclavian arteries. Bilateral ABI's were normal in 10/2020.   She was seen in 10/2020 and was doing well from a cardiac perspective.  She did continue to note a significant improvement in her chronic dizziness when having permissive BP. Home BP readings were in the 150s to 160s for the most part with occasional readings in the 789F systolic.  She was frequently taking prn Coreg, and in this setting, it was recommended she take Coreg 3.125 mg bid.   She was seen on 01/08/2021 noting a several week history of elevated BP readings, particularly in the morning with many readings in the 160s to 190s millimeters mercury systolic.  ReDs vest was elevated at 39%.  It was recommended she start amlodipine 5 mg nightly and continue carvedilol 3.125 mg twice daily.  Her Lasix was titrated to 40 mg daily for 3 days.  Labs obtained at that time showed a BNP of 163, potassium at goal, and normal renal function.  She underwent echo on 01/10/2021 which showed an EF of 60 to 65%, no regional wall motion abnormalities, indeterminate LV diastolic function parameters, normal RV systolic function and ventricular  cavity size, and moderate mitral regurgitation.  She was last seen in the office on 01/14/2021 with stable weight and BP of 170/64 in the office.  She continued to note unchanged longstanding lightheadedness and dizziness.  She remained very anxious.  Her shortness of breath did improve some following increased diuresis.  She did note some palpitations.  Given prior noted orthostatic hypotension permissive hypertension was recommended and she was continued on carvedilol and amlodipine.  Lasix was adjusted to 40 mg alternating with 20 mg every other day.  With regards to her dizziness, lightheadedness, and palpitations Zio patch was recommended.  She comes in feeling much better today.  Her blood pressures have been running in the 810F to 751W systolic.  She notes significantly improved dizziness, palpitations, and dyspnea.  No chest pain.  No lower extremity swelling, orthopnea, PND, early satiety.  She is tolerating furosemide 40 mg alternating with 20 mg every other day without issues.  She is very pleased with her improvement.  Due to financial concerns she would like to undergo a rechallenge of Crestor in an effort to possibly come off of Repatha.  She does note significant pruritus at her Zio patch location.  Otherwise, she does not have any issues or concerns at this time.   Labs independently reviewed: 12/2020 - Hgb 11.9, PLT 358, potassium 4.0, BUN 18, serum creatinine 1.16 07/2020 - TSH normal 12/2019-albumin 4.4, AST/ALT normal 10/2019-A1c 5.8 01/2019-TC 125, TG 109, HDL 58,  LDL 45  Past Medical History:  Diagnosis Date  . Allergy    hay fever  . Anemia   . Anxiety   . Back pain   . Carotid artery occlusion   . Cerebrovascular disease    extracranial; occlusive  . Chicken pox   . Coronary artery disease   . Depression   . Dizziness   . Dizziness   . DVT (deep venous thrombosis) ([place])   . Fainting spell   . GERD (gastroesophageal reflux disease)   . Headache   . Heart murmur    . Hyperlipidemia   . Hypertension   . Leg pain   . PONV (postoperative nausea and vomiting)    severe nausea and vomiting  . Pre-syncope   . PVD (peripheral vascular disease) ([place])    endarterectomy by [name]  . Seasonal allergies   . Shortness of breath dyspnea    wth ambulation at times  . Swelling of both ankles    and abdomen; takes Lasix when needed  . Thrombophlebitis    following childbirth  . Ulcer     Past Surgical History:  Procedure Laterality Date  . APPENDECTOMY    . BACK SURGERY  01/14/16  . BREAST SURGERY     saline implants  . CARDIAC CATHETERIZATION    . CAROTID ENDARTERECTOMY  1992  . CAROTID ENDARTERECTOMY Right February 13, 2005   Re-do Right CE  . CATARACT EXTRACTION, BILATERAL  2013  . CHOLECYSTECTOMY    . CHOLECYSTECTOMY, LAPAROSCOPIC  04/02/14   Dr. Rochel Brome  . CORONARY ARTERY BYPASS GRAFT  2006   x3 Dr. Prescott Gum  . ENDARTERECTOMY Left 07/20/2014   Procedure: ENDARTERECTOMY CAROTID-LEFT;  Surgeon: Rosetta Posner, MD;  Location: [facility];  Service: Vascular;  Laterality: Left;  . EYE SURGERY Bilateral Feb. 2013   Cataract Left eye  . SEPTOPLASTY     with bilateral inferior turbinate reductions  . SPINE SURGERY  12/2015  . TOTAL ABDOMINAL HYSTERECTOMY    . UPPER GASTROINTESTINAL ENDOSCOPY     had polyps removed from esophagus  . UPPER GI ENDOSCOPY      Current Medications: Current Meds  Medication Sig  . amLODipine (NORVASC) 5 MG tablet Take 1 tablet (5 mg total) by mouth at bedtime.  Marland Kitchen aspirin 81 MG tablet Take 1 tablet (81 mg total) by mouth daily.  . carvedilol (COREG) 3.125 MG tablet Take 1 tablet (3.125 mg total) by mouth 2 (two) times daily.  . Coenzyme Q10 (CO Q10) 100 MG CAPS Take 100 mg by mouth daily.  . Evolocumab (REPATHA SURECLICK) [id number] MG/ML SOAJ Inject 140 mg into the skin every 14 (fourteen) days.  Marland Kitchen ezetimibe (ZETIA) 10 MG tablet TAKE ONE TABLET EVERY DAY  . fluticasone (FLONASE) 50 MCG/ACT nasal spray Place 1 spray into both  nostrils daily.  . furosemide (LASIX) 20 MG tablet Take 1 tablet (20mg ) QOD alternating with 2 tablets (40mg ) QOD  . gabapentin (NEURONTIN) 400 MG capsule Take 400 mg by mouth every 8 (eight) hours.  Marland Kitchen loratadine (CLARITIN) 10 MG tablet Take 10 mg by mouth daily.  Marland Kitchen omeprazole (PRILOSEC) 20 MG capsule Take 1 capsule (20 mg total) by mouth daily.  . potassium chloride (KLOR-CON) 10 MEQ tablet Take 10 mEq by mouth daily.  . rosuvastatin (CRESTOR) 20 MG tablet Take 1 tablet (20 mg total) by mouth daily.  . sertraline (ZOLOFT) 100 MG tablet TAKE ONE TABLET BY MOUTH EVERY DAY  . traMADol (ULTRAM) 50 MG  tablet Take 1 tablet (50 mg total) by mouth in the morning.  . vitamin B-12 (CYANOCOBALAMIN) 1000 MCG tablet Take 1 tablet (1,000 mcg total) by mouth daily.    Allergies:   Lyrica [pregabalin], Hydrocodone, Ivp dye [iodinated diagnostic agents], Lipitor [atorvastatin], Oxycodone hcl, Penicillin g, Shellfish allergy, Clavulanic acid, Prednisone, Augmentin [amoxicillin-pot clavulanate], Codeine, Crestor [rosuvastatin calcium], Cymbalta [duloxetine hcl], Demerol, Doxycycline, Effexor [venlafaxine], Hydrochlorothiazide, Hydroxyzine, Metoprolol, Protonix [pantoprazole sodium], and Trazodone and nefazodone   Social History   Socioeconomic History  . Marital status: Married    Spouse name: Jeneen Rinks   . Number of children: 1  . Years of education: Not on file  . Highest education level: 11th grade  Occupational History    Comment: Retired   Tobacco Use  . Smoking status: Former Smoker    Packs/day: 0.30    Years: 15.00    Pack years: 4.50    Types: Cigarettes    Quit date: 08/31/1996    Years since quitting: 24.4  . Smokeless tobacco: Never Used  Vaping Use  . Vaping Use: Never used  Substance and Sexual Activity  . Alcohol use: No    Alcohol/week: 0.0 standard drinks  . Drug use: No  . Sexual activity: Not Currently  Other Topics Concern  . Not on file  Social History Narrative   Lives with  husband   Work - retired Emergency planning/management officer   Diet - healthy   Right handed    Caffeine- 1 cup per day.   Social Determinants of Health   Financial Resource Strain: Not on file  Food Insecurity: Not on file  Transportation Needs: Not on file  Physical Activity: Not on file  Stress: Not on file  Social Connections: Not on file     Family History:  The patient's family history includes Aneurysm in her brother and mother; Birth defects in her brother; Bleeding Disorder in her mother and sister; Cancer in her sister; Cirrhosis in her father; Deep vein thrombosis in her brother; Heart attack in her father; Heart disease in her brother, father, mother, and sister; Hyperlipidemia in her brother, daughter, father, mother, and sister; Hypertension in her brother, daughter, father, mother, sister, and another family member; Peripheral vascular disease in her daughter; Varicose Veins in her mother and sister. There is no history of Breast cancer or Colon cancer.  ROS:   Review of Systems  Constitutional: Positive for malaise/fatigue. Negative for chills, diaphoresis, fever and weight loss.       Improved fatigue  HENT: Negative for congestion.   Eyes: Negative for discharge and redness.  Respiratory: Negative for cough, sputum production, shortness of breath and wheezing.   Cardiovascular: Positive for palpitations. Negative for chest pain, orthopnea, claudication, leg swelling and PND.       Improved palpitations  Gastrointestinal: Negative for abdominal pain, heartburn, nausea and vomiting.  Musculoskeletal: Negative for falls and myalgias.  Skin: Positive for itching. Negative for rash.       Pruritus noted at [facility] patch application site  Neurological: Positive for dizziness. Negative for tingling, tremors, sensory change, speech change, focal weakness, loss of consciousness and weakness.       Improved dizziness  Endo/Heme/Allergies: Does not bruise/bleed easily.  Psychiatric/Behavioral:  Negative for substance abuse. The patient is not nervous/anxious.      EKGs/Labs/Other Studies Reviewed:    Studies reviewed were summarized above. The additional studies were reviewed today:  2D echo 11/2019: 1. Left ventricular ejection fraction, by estimation, is 65 to 70%.  The  left ventricle has normal function. Left ventricular endocardial border  not optimally defined to evaluate regional wall motion. Left ventricular  diastolic parameters are consistent  with Grade III diastolic dysfunction (restrictive). Elevated left atrial  pressure.  2. Right ventricular systolic function is normal. The right ventricular  size is normal. Tricuspid regurgitation signal is inadequate for assessing  PA pressure.  3. The mitral valve is grossly normal. Trivial mitral valve  regurgitation.  4. The aortic valve was not well visualized. Aortic valve regurgitation  is not visualized. No aortic stenosis is present.  5. The inferior vena cava is normal in size with greater than 50%  respiratory variability, suggesting right atrial pressure of 3 mmHg. __________  Carlton Adam MPI 02/2020: Pharmacological myocardial perfusion imaging study with no significant ischemia Normal wall motion, EF estimated at 76% No EKG changes concerning for ischemia at peak stress or in recovery. Resting EKG with PVCs Attenuation correction images with aortic atherosclerosis and coronary calcification Low risk scan ___________  2D echo 01/10/2021: 1. Left ventricular ejection fraction, by estimation, is 60 to 65%. The  left ventricle has normal function. The left ventricle has no regional  wall motion abnormalities. Left ventricular diastolic parameters are  indeterminate.  2. Right ventricular systolic function is normal. The right ventricular  size is normal. Tricuspid regurgitation signal is inadequate for assessing  PA pressure.  3. The mitral valve was not well visualized. Moderate mitral valve   regurgitation.  __________  Elwyn Reach patch 12/2020: Pending   EKG:  EKG is not ordered today.    Recent Labs: 07/29/2020: TSH 1.150 01/14/2021: BNP 137.4; BUN 18; Creatinine, Ser 1.16; Hemoglobin 11.9; Platelets 358; Potassium 4.0; Sodium 142  Recent Lipid Panel    Component Value Date/Time   CHOL 125 02/01/2019 0907   CHOL 136 06/29/2016 0945   TRIG 109.0 02/01/2019 0907   HDL 58.40 02/01/2019 0907   HDL 57 06/29/2016 0945   CHOLHDL 2 02/01/2019 0907   VLDL 21.8 02/01/2019 0907   LDLCALC 45 02/01/2019 0907   LDLCALC 55 06/29/2016 0945   LDLDIRECT 139.1 04/11/2012 1131    PHYSICAL EXAM:    VS:  BP 140/60 (BP Location: Left Arm, Patient Position: Sitting, Cuff Size: Normal)   Pulse 63   Ht 5\' 2"  (1.575 m)   Wt 145 lb (65.8 kg)   SpO2 96%   BMI 26.52 kg/m   BMI: Body mass index is 26.52 kg/m.  Physical Exam Vitals reviewed.  Constitutional:      Appearance: She is well-developed.  HENT:     Head: Normocephalic and atraumatic.  Eyes:     General:        Right eye: No discharge.        Left eye: No discharge.  Neck:     Vascular: No JVD.  Cardiovascular:     Rate and Rhythm: Normal rate and regular rhythm.     Pulses: No midsystolic click and no opening snap.          Carotid pulses are on the right side with bruit and on the left side with bruit.      Posterior tibial pulses are 2+ on the right side and 2+ on the left side.     Heart sounds: Normal heart sounds, S1 normal and S2 normal. Heart sounds not distant. No murmur heard. No friction rub.  Pulmonary:     Effort: Pulmonary effort is normal. No respiratory distress.     Breath sounds: Normal breath  sounds. No decreased breath sounds, wheezing or rales.  Chest:     Chest wall: No tenderness.  Abdominal:     General: There is no distension.     Palpations: Abdomen is soft.     Tenderness: There is no abdominal tenderness.  Musculoskeletal:     Cervical back: Normal range of motion.  Skin:    General:  Skin is warm and dry.     Nails: There is no clubbing.  Neurological:     Mental Status: She is alert and oriented to person, place, and time.  Psychiatric:        Speech: Speech normal.        Behavior: Behavior normal.        Thought Content: Thought content normal.        Judgment: Judgment normal.     Wt Readings from Last 3 Encounters:  01/21/21 145 lb (65.8 kg)  01/14/21 142 lb 9.6 oz (64.7 kg)  01/08/21 144 lb (65.3 kg)     ASSESSMENT & PLAN:   1. HFpEF: Volume status is much improved.  For now, continue Lasix 40 mg alternating with 20 mg every other day.  Check BMP.  Exacerbation of volume overload likely in the setting of accelerated hypertension which is also improved.  Continue current medical therapy.  2. HTN with history of orthostatic hypotension: Blood pressure is well controlled in the 193X to 902I systolic.  She will need some degree of permissive hypertension given history of orthostasis/lightheadedness.  Continue carvedilol 3.125 mg twice daily and amlodipine 5 mg.  Low-sodium diet recommended  3. CAD status post CABG without angina: No symptoms concerning for angina.  Continue aspirin, carvedilol, Zetia, and Repatha.  She would like to undergo a rechallenge of Crestor as outlined below.  No indication for ischemic testing at this time.  4. Chronic dizziness with palpitations: Longstanding issue though is currently improved.  Due to pruritus noted at [facility] patch application site this will be removed when she gets home and she will mail the heart monitor back.  Await Zio patch.  Maintain adequate hydration.  5. HLD: LDL 45.  She has documented intolerance to Lipitor and Crestor and has been on Zetia with Repatha.  Due to financial constraints she would like to undergo a rechallenge of Crestor.  She will go ahead and give herself her Repatha injection which is scheduled for 5/27.  Following this, she will start Crestor 20 mg daily.  We will see her back in 1 month to  evaluate if she is tolerating resumption of high intensity statin.  6. Carotid artery disease: Status post bilateral CEA.  Most recent carotid artery ultrasound with 1 to 39% bilateral ICA stenosis.  Followed by vascular surgery.  Lipid therapy and aspirin as above.  Disposition: F/u with Dr. Rockey Situ or an APP in 1 month.   Medication Adjustments/Labs and Tests Ordered: Current medicines are reviewed at length with the patient today.  Concerns regarding medicines are outlined above. Medication changes, Labs and Tests ordered today are summarized above and listed in the Patient Instructions accessible in Encounters.   Signed, Christell Faith, PA-C 01/21/2021 10:31 AM     [facility] [address] [place], [place] [address] [phone]

## 2021-01-21 ENCOUNTER — Encounter: Payer: Self-pay | Admitting: Physician Assistant

## 2021-01-21 ENCOUNTER — Other Ambulatory Visit: Payer: Self-pay

## 2021-01-21 ENCOUNTER — Ambulatory Visit: Payer: Medicare HMO | Admitting: Physician Assistant

## 2021-01-21 VITALS — BP 140/60 | HR 63 | Ht 62.0 in | Wt 145.0 lb

## 2021-01-21 DIAGNOSIS — I10 Essential (primary) hypertension: Secondary | ICD-10-CM

## 2021-01-21 DIAGNOSIS — R42 Dizziness and giddiness: Secondary | ICD-10-CM | POA: Diagnosis not present

## 2021-01-21 DIAGNOSIS — I951 Orthostatic hypotension: Secondary | ICD-10-CM

## 2021-01-21 DIAGNOSIS — I2510 Atherosclerotic heart disease of native coronary artery without angina pectoris: Secondary | ICD-10-CM

## 2021-01-21 DIAGNOSIS — I5032 Chronic diastolic (congestive) heart failure: Secondary | ICD-10-CM | POA: Diagnosis not present

## 2021-01-21 DIAGNOSIS — I6523 Occlusion and stenosis of bilateral carotid arteries: Secondary | ICD-10-CM | POA: Diagnosis not present

## 2021-01-21 DIAGNOSIS — E785 Hyperlipidemia, unspecified: Secondary | ICD-10-CM

## 2021-01-21 DIAGNOSIS — Z951 Presence of aortocoronary bypass graft: Secondary | ICD-10-CM

## 2021-01-21 MED ORDER — ROSUVASTATIN CALCIUM 20 MG PO TABS: 20.0000 mg | ORAL_TABLET | Freq: Every day | ORAL | 3 refills | Status: DC

## 2021-01-21 NOTE — Patient Instructions (Addendum)
Medication Instructions:  Your physician has recommended you make the following change in your medication:   1. START Rosuvastatin (Crestor) 20 mg once daily  *If you need a refill on your cardiac medications before your next appointment, please call your pharmacy*   Lab Work: BMET today  If you have labs (blood work) drawn today and your tests are completely normal, you will receive your results only by: Marland Kitchen MyChart Message (if you have MyChart) OR . A paper copy in the mail If you have any lab test that is abnormal or we need to change your treatment, we will call you to review the results.   Testing/Procedures: None    Follow-Up: At [facility], you and your health needs are our priority.  As part of our continuing mission to provide you with exceptional heart care, we have created designated Provider Care Teams.  These Care Teams include your primary Cardiologist (physician) and Advanced Practice Providers (APPs -  Physician Assistants and Nurse Practitioners) who all work together to provide you with the care you need, when you need it.   Your next appointment:   1 month(s)  The format for your next appointment:   In Person  Provider:   Christell Faith, PA-C

## 2021-01-22 LAB — BASIC METABOLIC PANEL
BUN/Creatinine Ratio: 15 (ref 12–28)
BUN: 13 mg/dL (ref 8–27)
CO2: 27 mmol/L (ref 20–29)
Calcium: 9.5 mg/dL (ref 8.7–10.3)
Chloride: 97 mmol/L (ref 96–106)
Creatinine, Ser: 0.85 mg/dL (ref 0.57–1.00)
Glucose: 94 mg/dL (ref 65–99)
Potassium: 4.1 mmol/L (ref 3.5–5.2)
Sodium: 140 mmol/L (ref 134–144)
eGFR: 69 mL/min/{1.73_m2} (ref >59)

## 2021-01-31 DIAGNOSIS — I5032 Chronic diastolic (congestive) heart failure: Secondary | ICD-10-CM | POA: Diagnosis not present

## 2021-01-31 DIAGNOSIS — R0600 Dyspnea, unspecified: Secondary | ICD-10-CM | POA: Diagnosis not present

## 2021-01-31 DIAGNOSIS — R002 Palpitations: Secondary | ICD-10-CM | POA: Diagnosis not present

## 2021-01-31 DIAGNOSIS — R42 Dizziness and giddiness: Secondary | ICD-10-CM | POA: Diagnosis not present

## 2021-02-07 ENCOUNTER — Ambulatory Visit (INDEPENDENT_AMBULATORY_CARE_PROVIDER_SITE_OTHER): Payer: Medicare HMO | Admitting: Family Medicine

## 2021-02-07 ENCOUNTER — Other Ambulatory Visit: Payer: Self-pay

## 2021-02-07 ENCOUNTER — Encounter: Payer: Self-pay | Admitting: Family Medicine

## 2021-02-07 ENCOUNTER — Telehealth: Payer: Self-pay | Admitting: *Deleted

## 2021-02-07 VITALS — BP 134/66 | HR 57 | Temp 98.0°F | Ht 62.0 in | Wt 144.0 lb

## 2021-02-07 DIAGNOSIS — R238 Other skin changes: Secondary | ICD-10-CM

## 2021-02-07 DIAGNOSIS — E1129 Type 2 diabetes mellitus with other diabetic kidney complication: Secondary | ICD-10-CM | POA: Diagnosis not present

## 2021-02-07 DIAGNOSIS — E782 Mixed hyperlipidemia: Secondary | ICD-10-CM | POA: Diagnosis not present

## 2021-02-07 DIAGNOSIS — R809 Proteinuria, unspecified: Secondary | ICD-10-CM

## 2021-02-07 DIAGNOSIS — Z8639 Personal history of other endocrine, nutritional and metabolic disease: Secondary | ICD-10-CM | POA: Diagnosis not present

## 2021-02-07 LAB — POCT GLYCOSYLATED HEMOGLOBIN (HGB A1C): Hemoglobin A1C: 5.8 % — AB (ref 4.0–5.6)

## 2021-02-07 MED ORDER — CARVEDILOL 6.25 MG PO TABS: 6.2500 mg | ORAL_TABLET | Freq: Two times a day (BID) | ORAL | 3 refills | Status: DC

## 2021-02-07 NOTE — Telephone Encounter (Signed)
-----   Message from Rise Mu, PA-C sent at 02/06/2021  3:01 PM EDT ----- Heart monitor showed a predominant rhythm of sinus with an average heart rate of 62 bpm (range 48-135 bpm), 3 episodes of SVT (fat heart rhythm from the top portion of the heart) with the longest episode lasting 14 seconds. Rare PACs. Frequent PVCs representing at 13.4% burden. No patient triggered events occurred.   Recommendations: -Stop amlodipine  -Increase Coreg to 6.25 mg bid to decrease PVC burden -Follow up as planned, will check magnesium at her next visit

## 2021-02-07 NOTE — Patient Instructions (Signed)
We'll call about seeing dermatology.  Try taking gabapentin and see if that helps your scalp.  Your scalp looks good today but I think the nerves in your skin/scalp are getting irritated.    Likely lipomas- benign fatty deposits on the skin.  Take care.  Glad to see you.

## 2021-02-07 NOTE — Telephone Encounter (Signed)
Patient returning call.

## 2021-02-07 NOTE — Telephone Encounter (Signed)
Left voicemail message to call back for review of results.  

## 2021-02-07 NOTE — Telephone Encounter (Signed)
Spoke with patient and reviewed results and recommendations. She verbalized understanding and requested medication changes sent to pharmacy. She had no further questions at this time but did request instructions sent to her My Chart

## 2021-02-07 NOTE — Progress Notes (Addendum)
This visit occurred during the SARS-CoV-2 public health emergency.  Safety protocols were in place, including screening questions prior to the visit, additional usage of staff PPE, and extensive cleaning of exam room while observing appropriate contact time as indicated for disinfecting solutions.  H/o DM2.  No meds.  A1c done at [facility], d/w pt  On crestor, able to tolerate in the meantime.  No myalgias.  Not diabetic by A1c, 5.8.  Off prednisone.    Skin symptoms.  Scalp symptoms.  Itching.  Noted some "knots" in the scalp.  She didn't want to f/u with [facility] dermatology.  Locally itchy, sensitive, can be painful/burning.  Taking gabapentin prn for her back previously but not often or recently.  No new shampoos or triggers.    Skin lesions noted on the arms.  She wanted those evaluated.  Meds, vitals, and allergies reviewed.   ROS: Per HPI unless specifically indicated in ROS section   GEN: nad, alert and oriented HEENT: ncat, no scalp lesions seen.  No ulceration.  No erythema. NECK: supple w/o LA CV: rrr. SKIN: no acute rash but likely lipomas noted on the B arms-reassured.  Discussed with patient.

## 2021-02-09 DIAGNOSIS — Z8639 Personal history of other endocrine, nutritional and metabolic disease: Secondary | ICD-10-CM | POA: Insufficient documentation

## 2021-02-09 NOTE — Assessment & Plan Note (Signed)
Off prednisone and that was likely the main issue.  Not diabetic by A1c.  Discussed with patient.

## 2021-02-09 NOTE — Assessment & Plan Note (Signed)
She has a feeling of scalp irritation and burning without any lesions seen.  She wanted to follow-up with dermatology for second opinion and I think this is reasonable.  In the meantime, in the absence of obvious skin lesions on the scalp, I think it is worth trying gabapentin again to see if it makes any difference in the sensations that she is having on her scalp.  I question if she has occipital neuralgia or similar that is contributing to the symptoms she has on her scalp.  She agrees with plan.

## 2021-02-09 NOTE — Assessment & Plan Note (Signed)
Able to tolerate Crestor so far.  I do not think this is related to her scalp symptoms.  Would continue Crestor.

## 2021-02-17 ENCOUNTER — Other Ambulatory Visit: Payer: Self-pay | Admitting: Cardiovascular Disease

## 2021-02-20 NOTE — Progress Notes (Deleted)
Cardiology Office Note    Date:  02/20/2021   ID:  Catherine Green, DOB Jul 09, 1941, MRN [id number]  PCP:  Tonia Ghent, MD  Cardiologist:  Ida Rogue, MD  Electrophysiologist:  None   Chief Complaint: Follow up  History of Present Illness:   Catherine Green is a 80 y.o. female with history of CAD status post three-vessel CABG in 2006, HFpEF, bilateral carotid artery disease status post bilateral CEA followed by vascular surgery, HTN, HLD, multiple medication intolerances, back pain status post surgery, prior tobacco use starting at age 68 and quitting at age 62 who presents for follow up of Zio patch.   Most recent cardiac cath from 09/2007 demonstrated an atretic LIMA to LAD with patent vein grafts.   She was seen at her PCPs office in 10/2019 with dyspnea.  BNP mildly elevated at 300.  Her symptoms were felt to be likely multifactorial including volume overload and mild longstanding anemia.  She was started on Lasix 40 mg daily.  Echo in 11/2019 showed a hyperdynamic LVSF with an EF of 65 to 92%, grade 3 diastolic dysfunction, normal RV systolic function and RV cavity size, and trivial mitral regurgitation.  Unable to estimate right heart pressures, IVC not documented as being dilated.  Small pleural effusion was noted.  Following this echo she was taking Lasix 40 mg twice daily with continued swelling.  Recheck at her PCPs office in late 12/2019 demonstrated her weight was down 6 pounds, however she continued to note bilateral pedal edema and abdominal bloating as the day progressed.  She continued to have some occasional dyspnea in the afternoons.  Her appetite remained down.  She was seen in the office on 02/06/2020 noting increased dyspnea, PND, 2-3 pillow orthopnea, and increased fatigue.  BP was elevated in the 010O to 712R systolic.  Her weight was down 8 pounds in the past 3 months with notes indicating she was missing meals.  She reported nausea when eating with associated dysphagia.   Repeat BNP mildly improved at 239 with stable renal function.  She was taking Lasix 40 mg daily.  It was felt there was some component of venous insufficiency with her lower extremity swelling.  It was recommended she increase her Lasix to 40 mg twice daily for 1 week followed by 40 mg daily thereafter.  She was seen in 02/2020 noting improvement in her dyspnea following diuresis and had continued to take Lasix 40 mg twice daily since her visit in 01/2020.  Her weight is down another 6 pounds when compared to her last clinic visit.  She did note some positional dizziness and generalized fatigue.  She was orthostatic in the office that day.  Lasix was decreased to 40 mg daily.  Symptoms felt similar to her presentation leading up to her bypass.  In this setting, she underwent Lexiscan MPI on 03/15/2020 which showed no significant ischemia, normal wall motion, EF 76%, attenuation corrected images with aortic atherosclerosis and coronary artery calcifications, and was overall a low risk scan.  EKG showed PVCs.  CTA of the neck from 04/23/2020 showed no dissection or pseudoaneurysm without interval growth of remodeling/fusiform aneurysm within the right internal carotid artery, 30% left ICA stenosis, and 70% stenosis of the brachiocephalic origin.  There was 40% subclavian artery stenosis.  She was seen in the office on 04/29/2020 noting her dizziness persisted.  Her weight was stable.  She had further decreased her Lasix to 20 mg daily in the setting of ongoing  dizziness.  She denied any shortness of breath or lower extremity swelling.  Orthostatic vital signs remained positive at that visit.  Imdur was discontinued and carvedilol was decreased to 3.125 mg twice daily.  Following this, she contacted our office in 05/2020, noting continued orthostasis, and in this setting carvedilol was held.  She was seen in the office on 05/29/2020, and continued to note positional dizziness.  She was no longer on carvedilol or Imdur though  was taking Lasix 40 mg daily.  Home BP readings were predominantly in the 494W to 967R systolic.  Her dizziness was noted to date back at least to 02/2018.  She declined head CT.  She was advised to follow-up with her PCP.  It was again recommended her Lasix be decreased to 20 mg daily.  She was hypertensive when lying for orthostatic BP with orthostatics continuing to be mildly positive.  Renal artery ultrasound showed no evidence of renal artery stenosis.  She was seen in the office in 07/2020 noting some improvement in her chronic dizziness.  BP readings ranged from the 916B to 846 systolic with most heart rates in the 60s bpm. Carotid artery ultrasound in 07/2020, showed 1-39% bilateral ICA stenosis, antegrade flow along the vertebral arteries bilaterally, normal flow hemodynamics in the bilateral subclavian arteries. Bilateral ABI's were normal in 10/2020.    She was seen in 10/2020 and was doing well from a cardiac perspective.  She did continue to note a significant improvement in her chronic dizziness when having permissive BP.  Home BP readings were in the 150s to 160s for the most part with occasional readings in the 659D systolic.  She was frequently taking prn Coreg, and in this setting, it was recommended she take Coreg 3.125 mg bid.    She was seen on 01/08/2021 noting a several week history of elevated BP readings, particularly in the morning with many readings in the 160s to 190s millimeters mercury systolic.  ReDs vest was elevated at 39%.  It was recommended she start amlodipine 5 mg nightly and continue carvedilol 3.125 mg twice daily.  Her Lasix was titrated to 40 mg daily for 3 days.  Labs obtained at that time showed a BNP of 163, potassium at goal, and normal renal function.  She underwent echo on 01/10/2021 which showed an EF of 60 to 65%, no regional wall motion abnormalities, indeterminate LV diastolic function parameters, normal RV systolic function and ventricular cavity size, and moderate  mitral regurgitation.  She was seen in the office on 01/14/2021 with stable weight and BP of 170/64 in the office.  She continued to note unchanged longstanding lightheadedness and dizziness.  She remained very anxious.  Her shortness of breath did improve some following increased diuresis.  She did note some palpitations.  Given prior noted orthostatic hypotension permissive hypertension was recommended and she was continued on carvedilol and amlodipine.  Lasix was adjusted to 40 mg alternating with 20 mg every other day.  With regards to her dizziness, lightheadedness, and palpitations Zio patch was recommended.  She was last seen in the office on 01/21/2021 and was feeling much better with blood pressure running in the 357S to 177L systolic predominantly.  She noted significantly improved dizziness, palpitations, and dyspnea.  She was tolerating furosemide 40 mg alternating with 20 mg every other day.  Due to financial concerns, she preferred a rechallenge of Crestor in an effort to possibly come off of Repatha.  Subsequent Zio patch showed a predominant rhythm of sinus  with an average heart rate of 62 bpm (range 48 to 135 bpm), 3 runs of SVT with the fastest interval lasting 9 beats with a maximal rate of 135 bpm and the longest interval lasting 14 seconds with an average rate of 102 bpm, rare PACs, atrial couplets, and atrial triplets, frequent PVCs representing a 13.4% burden, rare ventricular couplets and triplets.  In this setting, carvedilol was titrated to 6.25 mg twice daily to reduce PVC burden.   ***     Labs independently reviewed: 01/2021 - A1c 5.8 12/2020 - Hgb 11.9, PLT 358, potassium 4.1, BUN 13, serum creatinine 0.85 07/2020 - TSH normal 12/2019 - albumin 4.4, AST/ALT normal 01/2019 - TC 125, TG 109, HDL 58, LDL 45    Past Medical History:  Diagnosis Date   Allergy    hay fever   Anemia    Anxiety    Back pain    Carotid artery occlusion    Cerebrovascular disease     extracranial; occlusive   Chicken pox    Coronary artery disease    Depression    Dizziness    Dizziness    DVT (deep venous thrombosis) (HCC)    Fainting spell    GERD (gastroesophageal reflux disease)    Headache    Heart murmur    Hyperlipidemia    Hypertension    Leg pain    PONV (postoperative nausea and vomiting)    severe nausea and vomiting   Pre-syncope    PVD (peripheral vascular disease) ([facility])    endarterectomy by [name]   Seasonal allergies    Shortness of breath dyspnea    wth ambulation at times   Swelling of both ankles    and abdomen; takes Lasix when needed   Thrombophlebitis    following childbirth   Ulcer     Past Surgical History:  Procedure Laterality Date   APPENDECTOMY     BACK SURGERY  01/14/16   BREAST SURGERY     saline implants   CARDIAC CATHETERIZATION     CAROTID ENDARTERECTOMY  1992   CAROTID ENDARTERECTOMY Right February 13, 2005   Re-do Right CE   CATARACT EXTRACTION, BILATERAL  2013   CHOLECYSTECTOMY     CHOLECYSTECTOMY, LAPAROSCOPIC  04/02/14   Dr. Rochel Brome   CORONARY ARTERY BYPASS GRAFT  2006   x3 Dr. Prescott Gum   ENDARTERECTOMY Left 07/20/2014   Procedure: ENDARTERECTOMY CAROTID-LEFT;  Surgeon: Rosetta Posner, MD;  Location: [facility];  Service: Vascular;  Laterality: Left;   EYE SURGERY Bilateral Feb. 2013   Cataract Left eye   SEPTOPLASTY     with bilateral inferior turbinate reductions   SPINE SURGERY  12/2015   TOTAL ABDOMINAL HYSTERECTOMY     UPPER GASTROINTESTINAL ENDOSCOPY     had polyps removed from esophagus   UPPER GI ENDOSCOPY      Current Medications: No outpatient medications have been marked as taking for the 02/21/21 encounter (Appointment) with Rise Mu, PA-C.    Allergies:   Lyrica [pregabalin], Hydrocodone, Ivp dye [iodinated diagnostic agents], Lipitor [atorvastatin], Oxycodone hcl, Penicillin g, Shellfish allergy, Clavulanic acid, Prednisone, Augmentin [amoxicillin-pot clavulanate], Codeine, Cymbalta  [duloxetine hcl], Demerol, Doxycycline, Effexor [venlafaxine], Hydrochlorothiazide, Hydroxyzine, Metoprolol, Protonix [pantoprazole sodium], and Trazodone and nefazodone   Social History   Socioeconomic History   Marital status: Married    Spouse name: Jeneen Rinks    Number of children: 1   Years of education: Not on file   Highest education level:  11th grade  Occupational History    Comment: Retired   Tobacco Use   Smoking status: Former    Packs/day: 0.30    Years: 15.00    Pack years: 4.50    Types: Cigarettes    Quit date: 08/31/1996    Years since quitting: 24.4   Smokeless tobacco: Never  Vaping Use   Vaping Use: Never used  Substance and Sexual Activity   Alcohol use: No    Alcohol/week: 0.0 standard drinks   Drug use: No   Sexual activity: Not Currently  Other Topics Concern   Not on file  Social History Narrative   Lives with husband   Work - retired Emergency planning/management officer   Diet - healthy   Right handed    Caffeine- 1 cup per day.   Social Determinants of Health   Financial Resource Strain: Not on file  Food Insecurity: Not on file  Transportation Needs: Not on file  Physical Activity: Not on file  Stress: Not on file  Social Connections: Not on file     Family History:  The patient's family history includes Aneurysm in her brother and mother; Birth defects in her brother; Bleeding Disorder in her mother and sister; Cancer in her sister; Cirrhosis in her father; Deep vein thrombosis in her brother; Heart attack in her father; Heart disease in her brother, father, mother, and sister; Hyperlipidemia in her brother, daughter, father, mother, and sister; Hypertension in her brother, daughter, father, mother, sister, and another family member; Peripheral vascular disease in her daughter; Varicose Veins in her mother and sister. There is no history of Breast cancer or Colon cancer.  ROS:   ROS   EKGs/Labs/Other Studies Reviewed:    Studies reviewed were summarized above. The  additional studies were reviewed today:  2D echo 11/2019: 1. Left ventricular ejection fraction, by estimation, is 65 to 70%. The  left ventricle has normal function. Left ventricular endocardial border  not optimally defined to evaluate regional wall motion. Left ventricular  diastolic parameters are consistent  with Grade III diastolic dysfunction (restrictive). Elevated left atrial  pressure.   2. Right ventricular systolic function is normal. The right ventricular  size is normal. Tricuspid regurgitation signal is inadequate for assessing  PA pressure.   3. The mitral valve is grossly normal. Trivial mitral valve  regurgitation.   4. The aortic valve was not well visualized. Aortic valve regurgitation  is not visualized. No aortic stenosis is present.   5. The inferior vena cava is normal in size with greater than 50%  respiratory variability, suggesting right atrial pressure of 3 mmHg. __________   Carlton Adam MPI 02/2020: Pharmacological myocardial perfusion imaging study with no significant  ischemia Normal wall motion, EF estimated at 76% No EKG changes concerning for ischemia at peak stress or in recovery. Resting EKG with PVCs Attenuation correction images with aortic atherosclerosis and coronary calcification Low risk scan ___________   2D echo 01/10/2021:  1. Left ventricular ejection fraction, by estimation, is 60 to 65%. The  left ventricle has normal function. The left ventricle has no regional  wall motion abnormalities. Left ventricular diastolic parameters are  indeterminate.   2. Right ventricular systolic function is normal. The right ventricular  size is normal. Tricuspid regurgitation signal is inadequate for assessing  PA pressure.   3. The mitral valve was not well visualized. Moderate mitral valve  regurgitation. __________   Elwyn Reach patch 12/2020: Predominant underlying rhythm was Sinus Rhythm. Patient had a min HR of  48 bpm, max HR of 135 bpm, and avg HR of  62 bpm.   3 Supraventricular Tachycardia/atrial tachycardia runs occurred, the run with the fastest interval lasting 9 beats with a max rate of 135 bpm, the longest lasting 14.0 secs with an avg rate of 102 bpm.     Isolated SVEs were rare (<1.0%), SVE Couplets were rare (<1.0%), and SVE Triplets were rare (<1.0%). Isolated VEs were frequent (13.4%, 115534), VE Couplets were rare (<1.0%, 84), and VE Triplets were rare (<1.0%, 1). Ventricular Bigeminy and Trigeminy were present.   No patient triggered events noted   EKG:  EKG is ordered today.  The EKG ordered today demonstrates ***  Recent Labs: 07/29/2020: TSH 1.150 01/14/2021: BNP 137.4; Hemoglobin 11.9; Platelets 358 01/21/2021: BUN 13; Creatinine, Ser 0.85; Potassium 4.1; Sodium 140  Recent Lipid Panel    Component Value Date/Time   CHOL 125 02/01/2019 0907   CHOL 136 06/29/2016 0945   TRIG 109.0 02/01/2019 0907   HDL 58.40 02/01/2019 0907   HDL 57 06/29/2016 0945   CHOLHDL 2 02/01/2019 0907   VLDL 21.8 02/01/2019 0907   LDLCALC 45 02/01/2019 0907   LDLCALC 55 06/29/2016 0945   LDLDIRECT 139.1 04/11/2012 1131    PHYSICAL EXAM:    VS:  There were no vitals taken for this visit.  BMI: There is no height or weight on file to calculate BMI.  Physical Exam  Wt Readings from Last 3 Encounters:  02/07/21 144 lb (65.3 kg)  01/21/21 145 lb (65.8 kg)  01/14/21 142 lb 9.6 oz (64.7 kg)     ASSESSMENT & PLAN:   HFpEF:  HTN with history of orthostatic hypotension: Blood pressure ***  CAD status post CABG without***angina:  Frequent PVCs:  Chronic dizziness:  HLD: LDL 45 on Repatha and Zetia with documented intolerance to Lipitor and Crestor.  ***  Carotid artery disease:  Disposition: F/u with Dr. Rockey Situ or an APP in ***.   Medication Adjustments/Labs and Tests Ordered: Current medicines are reviewed at length with the patient today.  Concerns regarding medicines are outlined above. Medication changes, Labs and  Tests ordered today are summarized above and listed in the Patient Instructions accessible in Encounters.   Signed, Christell Faith, PA-C 02/20/2021 7:42 AM     [facility] [address] [place], [place] [address] [phone]

## 2021-02-21 ENCOUNTER — Ambulatory Visit: Payer: Medicare HMO | Admitting: Physician Assistant

## 2021-02-21 ENCOUNTER — Telehealth: Payer: Self-pay | Admitting: Cardiovascular Disease

## 2021-02-21 ENCOUNTER — Telehealth: Payer: Self-pay | Admitting: Family Medicine

## 2021-02-21 NOTE — Telephone Encounter (Signed)
Spoke with patients husband after trying multiple times to call [facility]. He is going to call them on Monday to set his wife up an appt. Office is closed today; he rode up there this morning as well and said they were closed today. I advised him if he is not able to take care of setting up the appt to please call us back.

## 2021-02-21 NOTE — Telephone Encounter (Signed)
Patient spouse Jeneen Rinks came in office to request info about referral stated never got an a appt   Ambulatory referral to Dermatology (Order # [id number]) on 02/07/2021

## 2021-02-21 NOTE — Telephone Encounter (Signed)
Attempted to scheduled sooner from waitlist .  Lmov . Holding slot for patient until next week with gollan.

## 2021-02-26 ENCOUNTER — Ambulatory Visit: Payer: Medicare HMO | Admitting: Cardiovascular Disease

## 2021-02-26 ENCOUNTER — Other Ambulatory Visit: Payer: Self-pay

## 2021-02-26 ENCOUNTER — Encounter: Payer: Self-pay | Admitting: Cardiovascular Disease

## 2021-02-26 VITALS — BP 122/50 | HR 60 | Ht 62.0 in | Wt 145.1 lb

## 2021-02-26 DIAGNOSIS — I779 Disorder of arteries and arterioles, unspecified: Secondary | ICD-10-CM

## 2021-02-26 DIAGNOSIS — I25708 Atherosclerosis of coronary artery bypass graft(s), unspecified, with other forms of angina pectoris: Secondary | ICD-10-CM | POA: Diagnosis not present

## 2021-02-26 DIAGNOSIS — I739 Peripheral vascular disease, unspecified: Secondary | ICD-10-CM

## 2021-02-26 NOTE — Patient Instructions (Addendum)
Lipids today   Medication Instructions:  No changes  If you need a refill on your cardiac medications before your next appointment, please call your pharmacy.    Lab work: As above   If you have labs (blood work) drawn today and your tests are completely normal, you will receive your results only by: [place] (if you have MyChart) OR A paper copy in the mail If you have any lab test that is abnormal or we need to change your treatment, we will call you to review the results.   Testing/Procedures: No new testing needed   Follow-Up: At [facility], you and your health needs are our priority.  As part of our continuing mission to provide you with exceptional heart care, we have created designated Provider Care Teams.  These Care Teams include your primary Cardiologist (physician) and Advanced Practice Providers (APPs -  Physician Assistants and Nurse Practitioners) who all work together to provide you with the care you need, when you need it.  You will need a follow up appointment in 6 months  Providers on your designated Care Team:   Murray Hodgkins, NP Christell Faith, PA-C Marrianne Mood, PA-C  Any Other Special Instructions Will Be Listed Below (If Applicable).  COVID-19 Vaccine Information can be found at: [email or web address].uk For questions related to vaccine distribution or appointments, please email [email or web address] or call [phone].

## 2021-02-26 NOTE — Progress Notes (Signed)
Cardiology Office Note  Date:  02/26/2021   ID:  Catherine Green, DOB [birth year], MRN [id number]  PCP:  Tonia Ghent, MD   Chief Complaint  Patient presents with   1 month follow up     Patient is very upset due to her granddaughter [facility]) passing away yesterday with a heart attack. Medications reviewed by the patient verbally.     HPI:  Catherine Green is a very pleasant 80 year-old woman with history  Former smoker started age 22 , stopped age 69 coronary artery disease,  bypass surgery,  peripheral vascular disease  right CEA, left CEA followed by [name], 01/2018: left ICA are consistent with a 40-59% stenosis. catheterization January 2009 showing patent vein grafts, atretic LIMA to the LAD. Hx of anxiety Back surgery in May 2017, Chronic dizziness who presents for routine followup of her coronary artery disease.   LOV 03/2020  A1C 5.8 No lipids in 2 years  Could not afford PCSK9 inh, Back on crestor and zetia  Rare dizziness, "allergy stuff"  Stress , sister with lymphoma, at [facility]  Lasix 20 mg alternating with 40 mg Denies any leg swelling  Carotid ultrasound 2021 reviewed, stable disease Followed in [facility]  EKG personally reviewed by myself on todays visit Normal sinus rhythm rate 60 bpm no significant ST-T wave changes  Of past medical history reviewed CT scan neck 2. Remote right carotid endarterectomy. Positive remodeling/fusiform aneurysm at the distal right common carotid without interval growth; the associated low-density plaque has ulcerated since prior. 3. 70% stenosis of the brachiocephalic origin. 4. 50% left common carotid stenosis.  30% left ICA stenosis.  Echocardiogram April 2021 normal ejection fraction, diastolic dysfunction Unable to estimate right heart pressures IVC did not appear dilated   MRI of her back reviewed with her showing DJD, foraminal narrowing Seen by [name].    Previous symptoms of shortness of breath and  weight gain. Improved with Lasix to take as needed.  She had her gallbladder taken out 03/13/2014.  she had "hair loss" on simvastatin, as well as sores on her head. Lipitor caused leg problems/myalgias.     PMH:   has a past medical history of Allergy, Anemia, Anxiety, Back pain, Carotid artery occlusion, Cerebrovascular disease, Chicken pox, Coronary artery disease, Depression, Dizziness, Dizziness, DVT (deep venous thrombosis) (HCC), Fainting spell, GERD (gastroesophageal reflux disease), Headache, Heart murmur, Hyperlipidemia, Hypertension, Leg pain, PONV (postoperative nausea and vomiting), Pre-syncope, PVD (peripheral vascular disease) ([place]), Seasonal allergies, Shortness of breath dyspnea, Swelling of both ankles, Thrombophlebitis, and Ulcer.  PSH:    Past Surgical History:  Procedure Laterality Date   APPENDECTOMY     BACK SURGERY  01/14/16   BREAST SURGERY     saline implants   CARDIAC CATHETERIZATION     CAROTID ENDARTERECTOMY  1992   CAROTID ENDARTERECTOMY Right February 13, 2005   Re-do Right CE   CATARACT EXTRACTION, BILATERAL  2013   CHOLECYSTECTOMY     CHOLECYSTECTOMY, LAPAROSCOPIC  04/02/14   Dr. Rochel Brome   CORONARY ARTERY BYPASS GRAFT  2006   x3 Dr. Prescott Gum   ENDARTERECTOMY Left 07/20/2014   Procedure: ENDARTERECTOMY CAROTID-LEFT;  Surgeon: Rosetta Posner, MD;  Location: [place];  Service: Vascular;  Laterality: Left;   EYE SURGERY Bilateral Feb. 2013   Cataract Left eye   SEPTOPLASTY     with bilateral inferior turbinate reductions   SPINE SURGERY  12/2015   TOTAL ABDOMINAL HYSTERECTOMY     UPPER  GASTROINTESTINAL ENDOSCOPY     had polyps removed from esophagus   UPPER GI ENDOSCOPY      Current Outpatient Medications  Medication Sig Dispense Refill   aspirin 81 MG tablet Take 1 tablet (81 mg total) by mouth daily. 30 tablet    carvedilol (COREG) 6.25 MG tablet Take 1 tablet (6.25 mg total) by mouth 2 (two) times daily. 180 tablet 3   Coenzyme Q10 (CO Q10) 100 MG  CAPS Take 100 mg by mouth daily.     ezetimibe (ZETIA) 10 MG tablet TAKE ONE TABLET EVERY DAY 90 tablet 0   fluticasone (FLONASE) 50 MCG/ACT nasal spray Place 1 spray into both nostrils daily.     furosemide (LASIX) 20 MG tablet Take 1 tablet (20mg ) QOD alternating with 2 tablets (40mg ) QOD     gabapentin (NEURONTIN) 400 MG capsule Take 400 mg by mouth every 8 (eight) hours.     loratadine (CLARITIN) 10 MG tablet Take 10 mg by mouth daily.     omeprazole (PRILOSEC) 20 MG capsule Take 1 capsule (20 mg total) by mouth daily. 30 capsule 3   potassium chloride (KLOR-CON) 10 MEQ tablet Take 10 mEq by mouth daily.     rosuvastatin (CRESTOR) 20 MG tablet Take 1 tablet (20 mg total) by mouth daily. 90 tablet 3   sertraline (ZOLOFT) 100 MG tablet TAKE ONE TABLET BY MOUTH EVERY DAY 90 tablet 1   traMADol (ULTRAM) 50 MG tablet Take 1 tablet (50 mg total) by mouth in the morning.     vitamin B-12 (CYANOCOBALAMIN) 1000 MCG tablet Take 1 tablet (1,000 mcg total) by mouth daily.     No current facility-administered medications for this visit.    Allergies:   Lyrica [pregabalin], Hydrocodone, Ivp dye [iodinated diagnostic agents], Lipitor [atorvastatin], Oxycodone hcl, Penicillin g, Shellfish allergy, Clavulanic acid, Prednisone, Augmentin [amoxicillin-pot clavulanate], Codeine, Cymbalta [duloxetine hcl], Demerol, Doxycycline, Effexor [venlafaxine], Hydrochlorothiazide, Hydroxyzine, Metoprolol, Protonix [pantoprazole sodium], and Trazodone and nefazodone   Social History:  The patient  reports that she quit smoking about 24 years ago. Her smoking use included cigarettes. She has a 4.50 pack-year smoking history. She has never used smokeless tobacco. She reports that she does not drink alcohol and does not use drugs.   Family History:   family history includes Aneurysm in her brother and mother; Birth defects in her brother; Bleeding Disorder in her mother and sister; Cancer in her sister; Cirrhosis in her  father; Deep vein thrombosis in her brother; Heart attack in her father; Heart disease in her brother, father, mother, and sister; Hyperlipidemia in her brother, daughter, father, mother, and sister; Hypertension in her brother, daughter, father, mother, sister, and another family member; Peripheral vascular disease in her daughter; Varicose Veins in her mother and sister.    Review of Systems: Review of Systems  Constitutional: Negative.   HENT: Negative.    Respiratory: Negative.    Cardiovascular: Negative.   Gastrointestinal: Negative.   Musculoskeletal:  Positive for back pain.  Neurological: Negative.   Psychiatric/Behavioral: Negative.    All other systems reviewed and are negative.  PHYSICAL EXAM: VS:  BP (!) 122/50 (BP Location: Left Arm, Patient Position: Sitting, Cuff Size: Normal)   Pulse 60   Ht 5\' 2"  (1.575 m)   Wt 145 lb 2 oz (65.8 kg)   SpO2 96%   BMI 26.54 kg/m  , BMI Body mass index is 26.54 kg/m. Constitutional:  oriented to person, place, and time. No distress.  HENT:  Head:  Grossly normal Eyes:  no discharge. No scleral icterus.  Neck: No JVD, + carotid bruits most notable on the right Cardiovascular: Regular rate and rhythm, no murmurs appreciated Pulmonary/Chest: Clear to auscultation bilaterally, no wheezes or rails Abdominal: Soft.  no distension.  no tenderness.  Musculoskeletal: Normal range of motion Neurological:  normal muscle tone. Coordination normal. No atrophy Skin: Skin warm and dry Psychiatric: normal affect, pleasant   Recent Labs: 07/29/2020: TSH 1.150 01/14/2021: BNP 137.4; Hemoglobin 11.9; Platelets 358 01/21/2021: BUN 13; Creatinine, Ser 0.85; Potassium 4.1; Sodium 140    Lipid Panel Lab Results  Component Value Date   CHOL 125 02/01/2019   HDL 58.40 02/01/2019   LDLCALC 45 02/01/2019   TRIG 109.0 02/01/2019      Wt Readings from Last 3 Encounters:  02/26/21 145 lb 2 oz (65.8 kg)  02/07/21 144 lb (65.3 kg)  01/21/21 145  lb (65.8 kg)      ASSESSMENT AND PLAN:  Shortness of breath Stable symptoms on Lasix 40 alternating with 20 daily Appears euvolemic  Adjustment disorder Prior history of anxiety, contributing to insomnia Loss of several family members, Discussed recent loss of family member  Carotid stenosis, bilateral /PAD Stable disease bilaterally , Followed by vascular in [place] Last ultrasound end of 2021  Essential hypertension -  Blood pressure stable, no changes made  Hyperlipidemia, unspecified hyperlipidemia type -  Previously on Crestor and Zetia Unable to afford PCSK9 inhibitor, now back on Crestor Zetia again Lipid panel ordered, pending today Goal LDL less than 70  Atherosclerosis of coronary artery bypass graft of native heart without angina pectoris Currently with no symptoms of angina. No further workup at this time. Continue current medication regimen.  Abdominal pain Previous mesenteric ultrasound, flow stable Followed by vascular   Total encounter time more than 25 minutes  Greater than 50% was spent in counseling and coordination of care with the patient     Orders Placed This Encounter  Procedures   Lipid panel   EKG 12-Lead      Signed, Esmond Plants, M.D., Ph.D. 02/26/2021  [place], [place]

## 2021-02-27 LAB — LIPID PANEL
Chol/HDL Ratio: 1.7 ratio (ref 0.0–4.4)
Cholesterol, Total: 110 mg/dL (ref 100–199)
HDL: 66 mg/dL (ref >39)
LDL Chol Calc (NIH): 28 mg/dL (ref 0–99)
Triglycerides: 80 mg/dL (ref 0–149)
VLDL Cholesterol Cal: 16 mg/dL (ref 5–40)

## 2021-03-10 NOTE — Progress Notes (Signed)
Pt's lab results mailed to pt, pt is not utilizing his MyChart account to see results   

## 2021-03-26 ENCOUNTER — Ambulatory Visit: Payer: Medicare HMO | Admitting: Cardiovascular Disease

## 2021-03-28 ENCOUNTER — Other Ambulatory Visit: Payer: Self-pay | Admitting: Family Medicine

## 2021-03-31 ENCOUNTER — Telehealth: Payer: Self-pay

## 2021-03-31 NOTE — Telephone Encounter (Signed)
I spoke with pt; pt said starting middle of last wk with congestion in chest; pt has slight SOB and chest tightness and no fever. Pt said she has already went over all these questions; I explained that I was concerned and wanted to make sure pt did not need a face to face visit sooner than tomorrow. Pt said no she would be OK to wait for phone visit with Dr Damita Dunnings. UC & ED precautions given and pt voiced understanding. Sending note to Dr Damita Dunnings and Janett Billow CMA (please verify that Dr Damita Dunnings sees this note today and thank you.)

## 2021-03-31 NOTE — Telephone Encounter (Signed)
Reasonable to see tomorrow as long as she doesn't have urgent sx in the meantime.  Reasonable to take a home covid test in the meantime, if not already done.  Thanks.

## 2021-03-31 NOTE — Telephone Encounter (Signed)
Spoke with patient and she is doing telephone call visit tomorrow at 12:30 pm. She did do a home covid test this morning after speaking with Rena and that was negative.

## 2021-03-31 NOTE — Telephone Encounter (Signed)
[facility] - Client TELEPHONE ADVICE RECORD [facility] Patient Name: Catherine Green ER Gender: Female DOB: 1940-10-02 Age: 80 Y 55 D Return Phone Number: [phone] (Primary) Address: City/ State/ Zip: [place] [address] Client [facility] - Client Client Site [place] Physician Renford Dills - MD Contact Type Call Who Is Calling Patient / Member / Family / Caregiver Call Type Triage / Clinical Relationship To Patient Self Return Phone Number [phone] (Primary) Chief Complaint CHEST PAIN - pain, pressure, heaviness or tightness Reason for Call Symptomatic / Request for [place] is with the office, has a Pt who has sinus congestion and believes it could be changing into bronchitis. No apts available for today. She wants to see Dr. Damita Dunnings but he is not available until tomorrow. Pt states it is sinus and is going down to her chest, her husband had it over the weekend and had to go to an UC. Current symptoms include congestion and difficulty breathing/ chest tightness. Has an apt tomorrow. Translation No Nurse Assessment Nurse: Cira Servant, RN, Suanne Marker Date/Time (Eastern Time): 03/31/2021 8:34:55 AM Confirm and document reason for call. If symptomatic, describe symptoms. ---Yevette Edwards is with the office, has a Pt who has sinus congestion and believes it could be changing into bronchitis. No apts available for today. She wants to see Dr. Damita Dunnings but he is not available until tomorrow. Pt states it is sinus and is going down to her chest, her husband had it over the weekend and had to go to an UC. Current symptoms include congestion and difficulty breathing/ chest tightness. Has an apt tomorrow. Has not been tested for COVID. s/s began 5 days ago. Does the patient have any new or worsening symptoms? ---Yes Will a triage be completed?  ---Yes Related visit to physician within the last 2 weeks? ---No Does the PT have any chronic conditions? (i.e. diabetes, asthma, this includes High risk factors for pregnancy, etc.) ---Yes List chronic conditions. ---high chol; CAD Is this a behavioral health or substance abuse call? ---No PLEASE NOTE: All timestamps contained within this report are represented as [place] Standard Time. CONFIDENTIALTY NOTICE: This fax transmission is intended only for the addressee. It contains information that is legally privileged, confidential or otherwise protected from use or disclosure. If you are not the intended recipient, you are strictly prohibited from reviewing, disclosing, copying using or disseminating any of this information or taking any action in reliance on or regarding this information. If you have received this fax in error, please notify us immediately by telephone so that we can arrange for its return to [place]. Phone: [phone], Toll-Free: [phone], Fax: [phone] Page: 2 of 2 Call Id: [id number] Guidelines Guideline Title Affirmed Question Affirmed Notes Nurse Date/Time Eilene Ghazi Time) Sinus Pain or Congestion Wetzel Bjornstad, RN, Suanne Marker 03/31/2021 8:39:58 AM Disp. Time Eilene Ghazi Time) Disposition Final User 03/31/2021 8:32:59 AM Send to Urgent Queue Schlegelmilch, Apolonio Schneiders 03/31/2021 8:45:34 AM See PCP within 24 Hours Yes Cira Servant, RN, Rosalyn Charters Disagree/Comply Comply Caller Understands Yes PreDisposition Call Doctor Care Advice Given Per Guideline SEE PCP WITHIN 24 HOURS: * IF OFFICE WILL BE OPEN: You need to be examined within the next 24 hours. Call your doctor (or NP/PA) when the office opens and make an appointment. PAIN MEDICINES: * For pain relief, you can take either acetaminophen, ibuprofen, or naproxen. * They are over-the-counter (OTC) pain drugs. You can buy them at the drugstore. *  ACETAMINOPHEN - REGULAR STRENGTH TYLENOL: Take 650 mg (two 325 mg pills) by mouth  every 4 to 6 hours as needed. Each Regular Strength Tylenol pill has 325 mg of acetaminophen. The most you should take each day is 3,250 mg (10 pills a day). * ACETAMINOPHEN - EXTRA STRENGTH TYLENOL: Take 1,000 mg (two 500 mg pills) every 8 hours as needed. Each Extra Strength Tylenol pill has 500 mg of acetaminophen. The most you should take each day is 3,000 mg (6 pills a day). CALL BACK IF: * Difficulty breathing (and not relieved by cleaning out nose) * You become worse CARE ADVICE given per Sinus Pain or Congestion (Adult) guideline. Referrals REFERRED TO PCP OFFICE

## 2021-04-01 ENCOUNTER — Other Ambulatory Visit: Payer: Self-pay | Admitting: Family Medicine

## 2021-04-01 ENCOUNTER — Encounter: Payer: Self-pay | Admitting: Family Medicine

## 2021-04-01 ENCOUNTER — Telehealth (INDEPENDENT_AMBULATORY_CARE_PROVIDER_SITE_OTHER): Payer: Medicare HMO | Admitting: Family Medicine

## 2021-04-01 DIAGNOSIS — J019 Acute sinusitis, unspecified: Secondary | ICD-10-CM | POA: Diagnosis not present

## 2021-04-01 DIAGNOSIS — R238 Other skin changes: Secondary | ICD-10-CM | POA: Diagnosis not present

## 2021-04-01 MED ORDER — BENZONATATE 200 MG PO CAPS: 200.0000 mg | ORAL_CAPSULE | Freq: Three times a day (TID) | ORAL | 1 refills | Status: DC | PRN

## 2021-04-01 MED ORDER — AZITHROMYCIN 250 MG PO TABS: ORAL_TABLET | ORAL | 0 refills | Status: AC

## 2021-04-01 NOTE — Telephone Encounter (Signed)
Refill request for Gabapentin 400 mg caps  LOV - 04/01/21 Next OV - not scheduled Last refill - unsure; rx was entered in EMR on 10/25/20

## 2021-04-01 NOTE — Progress Notes (Signed)
Interactive audio and video telecommunications were attempted between this provider and patient, however failed, due to patient having technical difficulties OR patient did not have access to video capability.  We continued and completed visit with audio only.   Virtual Visit via Telephone Note  I connected with patient on 04/01/21  at 12:48 PM  by telephone and verified that I am speaking with the correct person using two identifiers.  Location of patient: home   Location of MD: [facility] Name of referring provider (if blank then none associated): Names per persons and role in encounter:  MD: Earlyne Iba, Patient: name listed above.    I discussed the limitations, risks, security and privacy concerns of performing an evaluation and management service by telephone and the availability of in person appointments. I also discussed with the patient that there may be a patient responsible charge related to this service. The patient expressed understanding and agreed to proceed.  CC: URI  History of Present Illness:  gabapentin helped with scalp symptoms.    Patient has had fatigue, cough, facial pressure, hoarseness, and head and sinus congestion since last Tuesday. Has been taking tylenol. No fevers.  Husband had sx about 1 month ago, he is well in the meantime.  Her sister and granddaughter both recently died and she had mult exposures with the funeral/etc.  Can take a deep breath except for nasal congestion. No loss or taste or smell.  No known covid exposures.  She tested neg for covid recently.      Observations/Objective No apparent distress.  Speaking in complete sentences.  Hoarse voice.   Assessment and Plan:  Presumed sinusitis.  Reasonable to use zithromax and tessalon.   Rest and fluids.   Routine cautions given to patient.  Okay for outpatient follow-up.  She will update [place] as needed.  Follow Up Instructions: see above.     I discussed the assessment and treatment  plan with the patient. The patient was provided an opportunity to ask questions and all were answered. The patient agreed with the plan and demonstrated an understanding of the instructions.   The patient was advised to call back or seek an in-person evaluation if the symptoms worsen or if the condition fails to improve as anticipated.  I provided 12 minutes of non-face-to-face time during this encounter.  Elsie Stain, MD

## 2021-04-02 NOTE — Assessment & Plan Note (Signed)
Presumed sinusitis.  Reasonable to use zithromax and tessalon.   Rest and fluids.   Routine cautions given to patient.  Okay for outpatient follow-up.  She will update [place] as needed.

## 2021-04-02 NOTE — Telephone Encounter (Signed)
Sent. Thanks.   

## 2021-04-02 NOTE — Assessment & Plan Note (Signed)
Continue gabapentin.  This helped. No ADE on med.

## 2021-04-14 ENCOUNTER — Other Ambulatory Visit (INDEPENDENT_AMBULATORY_CARE_PROVIDER_SITE_OTHER): Payer: Medicare HMO

## 2021-04-14 ENCOUNTER — Other Ambulatory Visit: Payer: Self-pay | Admitting: Family Medicine

## 2021-04-14 ENCOUNTER — Other Ambulatory Visit: Payer: Self-pay

## 2021-04-14 DIAGNOSIS — R519 Headache, unspecified: Secondary | ICD-10-CM

## 2021-04-14 DIAGNOSIS — Z961 Presence of intraocular lens: Secondary | ICD-10-CM | POA: Diagnosis not present

## 2021-04-14 DIAGNOSIS — H5211 Myopia, right eye: Secondary | ICD-10-CM | POA: Diagnosis not present

## 2021-04-14 LAB — CBC WITH DIFFERENTIAL/PLATELET
Basophils Absolute: 0 10*3/uL (ref 0.0–0.1)
Basophils Relative: 0.5 % (ref 0.0–3.0)
Eosinophils Absolute: 0 10*3/uL (ref 0.0–0.7)
Eosinophils Relative: 0.7 % (ref 0.0–5.0)
HCT: 35.8 % — ABNORMAL LOW (ref 36.0–46.0)
Hemoglobin: 11.7 g/dL — ABNORMAL LOW (ref 12.0–15.0)
Lymphocytes Relative: 23.3 % (ref 12.0–46.0)
Lymphs Abs: 1.4 10*3/uL (ref 0.7–4.0)
MCHC: 32.7 g/dL (ref 30.0–36.0)
MCV: 80.9 fl (ref 78.0–100.0)
Monocytes Absolute: 0.5 10*3/uL (ref 0.1–1.0)
Monocytes Relative: 7.7 % (ref 3.0–12.0)
Neutro Abs: 4.1 10*3/uL (ref 1.4–7.7)
Neutrophils Relative %: 67.8 % (ref 43.0–77.0)
Platelets: 303 10*3/uL (ref 150.0–400.0)
RBC: 4.42 Mil/uL (ref 3.87–5.11)
RDW: 14.4 % (ref 11.5–15.5)
WBC: 6.1 10*3/uL (ref 4.0–10.5)

## 2021-04-14 LAB — COMPREHENSIVE METABOLIC PANEL
ALT: 11 U/L (ref 0–35)
AST: 16 U/L (ref 0–37)
Albumin: 4.3 g/dL (ref 3.5–5.2)
Alkaline Phosphatase: 92 U/L (ref 39–117)
BUN: 17 mg/dL (ref 6–23)
CO2: 31 mEq/L (ref 19–32)
Calcium: 9 mg/dL (ref 8.4–10.5)
Chloride: 101 mEq/L (ref 96–112)
Creatinine, Ser: 0.79 mg/dL (ref 0.40–1.20)
GFR: 70.32 mL/min (ref >60.00)
Glucose, Bld: 98 mg/dL (ref 70–99)
Potassium: 3.5 mEq/L (ref 3.5–5.1)
Sodium: 141 mEq/L (ref 135–145)
Total Bilirubin: 0.4 mg/dL (ref 0.2–1.2)
Total Protein: 6.5 g/dL (ref 6.0–8.3)

## 2021-04-14 LAB — SEDIMENTATION RATE: Sed Rate: 10 mm/hr (ref 0–30)

## 2021-04-14 LAB — C-REACTIVE PROTEIN: CRP: <1 mg/dL (ref 0.5–20.0)

## 2021-04-14 NOTE — Progress Notes (Signed)
Call from Dr. Ellie Lunch.  Pain complained of temporal pain w/o sig finding on eye exam.  Dr. Ellie Lunch didn't think TA was likely but it was reasonable to check labs in the meantime.  I put in orders and Dr. Ellie Lunch will ask patient to come by today for labs.  I thank all involved.

## 2021-04-21 ENCOUNTER — Encounter: Payer: Self-pay | Admitting: Nurse Practitioner

## 2021-04-21 ENCOUNTER — Other Ambulatory Visit: Payer: Self-pay

## 2021-04-21 ENCOUNTER — Ambulatory Visit (INDEPENDENT_AMBULATORY_CARE_PROVIDER_SITE_OTHER): Payer: Medicare HMO | Admitting: Nurse Practitioner

## 2021-04-21 VITALS — BP 166/58 | HR 62 | Temp 98.3°F | Resp 12 | Ht 62.0 in | Wt 144.0 lb

## 2021-04-21 DIAGNOSIS — I10 Essential (primary) hypertension: Secondary | ICD-10-CM

## 2021-04-21 DIAGNOSIS — H6121 Impacted cerumen, right ear: Secondary | ICD-10-CM

## 2021-04-21 DIAGNOSIS — J0111 Acute recurrent frontal sinusitis: Secondary | ICD-10-CM

## 2021-04-21 DIAGNOSIS — R42 Dizziness and giddiness: Secondary | ICD-10-CM | POA: Diagnosis not present

## 2021-04-21 MED ORDER — CEFDINIR 300 MG PO CAPS: 300.0000 mg | ORAL_CAPSULE | Freq: Two times a day (BID) | ORAL | 0 refills | Status: AC

## 2021-04-21 NOTE — Assessment & Plan Note (Addendum)
Patient was seen virtually approximately 2 to 2-1/2 weeks ago.  She was placed on a Z-Pak.  States that it did not really help.  States that she felt like she was getting better and then had in the other direction is a stable since then.  Patient has a host of allergies.  Was going to place patient on doxycycline but she has intolerance. Did elect to use cefdinir. Patient has intolerances to PCn and Amox (N/V). No rash or trouble breathing. S&S reviewed as when to seek emergency care.

## 2021-04-21 NOTE — Assessment & Plan Note (Signed)
History of the same. Has tried Meclizine and OTC antihistamine. No causative factor per patient report. She did have a cerumen impaction. Will continue to monitor and she can continue using OTC meclizine. Neuro exam intact.

## 2021-04-21 NOTE — Progress Notes (Signed)
Acute Office Visit  Subjective:    Patient ID: Catherine Green, female    DOB: 1940-11-13, 80 y.o.   MRN: [id number]  Chief Complaint  Patient presents with   Sinus Problem    Sx x 2 weeks. Sinus pain, head feels full, both ears hurt. Zpak did not help.     HPI Patient is in today for 2 weeks  Sinus pian that started with right ear and went to temple and bilateral forehead. States an improvement in pain. States that it improved then came back. States she was on zpak approx 2 weeks ago. Flonase, meclzine, antihistamine without much relief. She also states she has been intermittent dizziness. No association per patient report.  Past Medical History:  Diagnosis Date   Allergy    hay fever   Anemia    Anxiety    Back pain    Carotid artery occlusion    Cerebrovascular disease    extracranial; occlusive   Chicken pox    Coronary artery disease    Depression    Dizziness    Dizziness    DVT (deep venous thrombosis) (HCC)    Fainting spell    GERD (gastroesophageal reflux disease)    Headache    Heart murmur    Hyperlipidemia    Hypertension    Leg pain    PONV (postoperative nausea and vomiting)    severe nausea and vomiting   Pre-syncope    PVD (peripheral vascular disease) ([place])    endarterectomy by [name]   Seasonal allergies    Shortness of breath dyspnea    wth ambulation at times   Swelling of both ankles    and abdomen; takes Lasix when needed   Thrombophlebitis    following childbirth   Ulcer     Past Surgical History:  Procedure Laterality Date   APPENDECTOMY     BACK SURGERY  01/14/16   BREAST SURGERY     saline implants   CARDIAC CATHETERIZATION     CAROTID ENDARTERECTOMY  1992   CAROTID ENDARTERECTOMY Right February 13, 2005   Re-do Right CE   CATARACT EXTRACTION, BILATERAL  2013   CHOLECYSTECTOMY     CHOLECYSTECTOMY, LAPAROSCOPIC  04/02/14   Dr. Rochel Brome   CORONARY ARTERY BYPASS GRAFT  2006   x3 Dr. Prescott Gum   ENDARTERECTOMY Left  07/20/2014   Procedure: ENDARTERECTOMY CAROTID-LEFT;  Surgeon: Rosetta Posner, MD;  Location: [facility];  Service: Vascular;  Laterality: Left;   EYE SURGERY Bilateral Feb. 2013   Cataract Left eye   SEPTOPLASTY     with bilateral inferior turbinate reductions   SPINE SURGERY  12/2015   TOTAL ABDOMINAL HYSTERECTOMY     UPPER GASTROINTESTINAL ENDOSCOPY     had polyps removed from esophagus   UPPER GI ENDOSCOPY      Family History  Problem Relation Age of Onset   Aneurysm Mother        brain   Heart disease Mother        Aneyursm    Hyperlipidemia Mother    Hypertension Mother    Varicose Veins Mother    Bleeding Disorder Mother    Heart disease Father    Cirrhosis Father    Heart attack Father    Hyperlipidemia Father    Hypertension Father    Heart disease Brother        Before age 74   Aneurysm Brother    Deep vein thrombosis Brother  Birth defects Brother    Hyperlipidemia Brother    Hypertension Brother    Hypertension Other    Cancer Sister        lung   Heart disease Sister        Aneurysm   Hyperlipidemia Sister    Hypertension Sister    Varicose Veins Sister    Peripheral vascular disease Daughter    Hyperlipidemia Daughter    Hypertension Daughter    Bleeding Disorder Sister    Breast cancer Neg Hx    Colon cancer Neg Hx     Social History   Socioeconomic History   Marital status: Married    Spouse name: Jeneen Rinks    Number of children: 1   Years of education: Not on file   Highest education level: 11th grade  Occupational History    Comment: Retired   Tobacco Use   Smoking status: Former    Packs/day: 0.30    Years: 15.00    Pack years: 4.50    Types: Cigarettes    Quit date: 08/31/1996    Years since quitting: 24.6   Smokeless tobacco: Never  Vaping Use   Vaping Use: Never used  Substance and Sexual Activity   Alcohol use: No    Alcohol/week: 0.0 standard drinks   Drug use: No   Sexual activity: Not Currently  Other Topics Concern   Not on  file  Social History Narrative   Lives with husband   Work - retired Emergency planning/management officer   Diet - healthy   Right handed    Caffeine- 1 cup per day.   Social Determinants of Health   Financial Resource Strain: Not on file  Food Insecurity: Not on file  Transportation Needs: Not on file  Physical Activity: Not on file  Stress: Not on file  Social Connections: Not on file  Intimate Partner Violence: Not on file    Outpatient Medications Prior to Visit  Medication Sig Dispense Refill   aspirin 81 MG tablet Take 1 tablet (81 mg total) by mouth daily. 30 tablet    carvedilol (COREG) 6.25 MG tablet Take 1 tablet (6.25 mg total) by mouth 2 (two) times daily. 180 tablet 3   Coenzyme Q10 (CO Q10) 100 MG CAPS Take 100 mg by mouth daily.     ezetimibe (ZETIA) 10 MG tablet TAKE ONE TABLET EVERY DAY 90 tablet 0   fluticasone (FLONASE) 50 MCG/ACT nasal spray Place 1 spray into both nostrils daily.     furosemide (LASIX) 20 MG tablet Take 1 tablet (71m) QOD alternating with 2 tablets (436m QOD     gabapentin (NEURONTIN) 400 MG capsule TAKE 1 CAPSULE BY MOUTH EVERY 8 HOURS 90 capsule 5   loratadine (CLARITIN) 10 MG tablet Take 10 mg by mouth daily.     omeprazole (PRILOSEC) 20 MG capsule Take 1 capsule (20 mg total) by mouth daily. 30 capsule 3   potassium chloride (KLOR-CON) 10 MEQ tablet Take 10 mEq by mouth daily.     rosuvastatin (CRESTOR) 20 MG tablet Take 1 tablet (20 mg total) by mouth daily. 90 tablet 3   sertraline (ZOLOFT) 100 MG tablet TAKE 1 TABLET BY MOUTH ONCE DAILY. 90 tablet 2   traMADol (ULTRAM) 50 MG tablet Take 1 tablet (50 mg total) by mouth in the morning.     vitamin B-12 (CYANOCOBALAMIN) 1000 MCG tablet Take 1 tablet (1,000 mcg total) by mouth daily.     benzonatate (TESSALON) 200 MG capsule Take 1 capsule (  200 mg total) by mouth 3 (three) times daily as needed. 30 capsule 1   No facility-administered medications prior to visit.    Allergies  Allergen Reactions   Lyrica  [Pregabalin] Shortness Of Breath, Swelling and Other (See Comments)    chest tight   Hydrocodone Other (See Comments)    hallucinate   Ivp Dye [Iodinated Diagnostic Agents] Hives and Itching        Lipitor [Atorvastatin] Nausea And Vomiting   Oxycodone Hcl Other (See Comments)    Hallucinations    Penicillin G Nausea And Vomiting   Shellfish Allergy Hives and Itching   Clavulanic Acid    Prednisone    Augmentin [Amoxicillin-Pot Clavulanate] Nausea And Vomiting   Codeine Nausea And Vomiting and Other (See Comments)   Cymbalta [Duloxetine Hcl] Nausea Only    GI upset at 51m   Demerol Nausea And Vomiting   Doxycycline Nausea Only and Other (See Comments)    Weakness, sick to her stomach   Effexor [Venlafaxine] Nausea Only   Hydrochlorothiazide Other (See Comments)    Not an allergy but urinary frequency and sweating at 219m  Hydroxyzine Other (See Comments)    Excessive sweating.    Metoprolol Other (See Comments)    Slowed body down, per pt    Protonix [Pantoprazole Sodium] Nausea Only   Trazodone And Nefazodone Other (See Comments)    Sedation     Review of Systems  Constitutional:  Negative for chills and fever.  HENT:  Positive for ear pain, sinus pressure and sinus pain. Negative for ear discharge and sore throat.   Respiratory:  Positive for cough. Negative for shortness of breath.   Cardiovascular:  Negative for chest pain.  Gastrointestinal:  Negative for diarrhea, nausea and vomiting.  Skin:  Negative for color change and pallor.  Neurological:  Positive for dizziness. Negative for headaches.      Objective:    Physical Exam Vitals and nursing note reviewed.  Constitutional:      Appearance: Normal appearance.  HENT:     Head:     Comments: TTP to bilateral frontal sinuses     Right Ear: Ear canal and external ear normal. No tenderness. There is impacted cerumen. Tympanic membrane is injected.     Left Ear: Tympanic membrane, ear canal and external ear  normal. No tenderness. There is no impacted cerumen.     Ears:      Comments: Right ear had cerumen impaction. Was able to remove with ear lavage. TM slightly injected on superior medial portion.    Mouth/Throat:     Mouth: Mucous membranes are moist.     Pharynx: Oropharynx is clear. No oropharyngeal exudate or posterior oropharyngeal erythema.  Eyes:     Extraocular Movements: Extraocular movements intact.     Pupils: Pupils are equal, round, and reactive to light.  Cardiovascular:     Rate and Rhythm: Normal rate and regular rhythm.     Heart sounds: Murmur heard.  Pulmonary:     Effort: Pulmonary effort is normal.     Breath sounds: Normal breath sounds.  Abdominal:     General: Bowel sounds are normal.  Musculoskeletal:     Right lower leg: No edema.     Left lower leg: No edema.  Lymphadenopathy:     Cervical: No cervical adenopathy.  Skin:    General: Skin is warm.  Neurological:     Mental Status: She is alert.     Cranial Nerves: No  cranial nerve deficit.     Sensory: No sensory deficit.     Motor: No weakness.     Coordination: Coordination is intact. Romberg sign negative. Coordination normal. Finger-Nose-Finger Test normal.     Gait: Gait normal.   BP (!) 166/58   Pulse 62   Temp 98.3 F (36.8 C)   Resp 12   Ht 5' 2"  (1.575 m)   Wt 144 lb (65.3 kg)   SpO2 95%   BMI 26.34 kg/m  Wt Readings from Last 3 Encounters:  04/21/21 144 lb (65.3 kg)  04/01/21 139 lb (63 kg)  02/26/21 145 lb 2 oz (65.8 kg)    Health Maintenance Due  Topic Date Due   Zoster Vaccines- Shingrix (1 of 2) Never done   COVID-19 Vaccine (4 - Booster for Pfizer series) 10/13/2020   INFLUENZA VACCINE  03/31/2021    There are no preventive care reminders to display for this patient.   Lab Results  Component Value Date   TSH 1.150 07/29/2020   Lab Results  Component Value Date   WBC 6.1 04/14/2021   HGB 11.7 (L) 04/14/2021   HCT 35.8 (L) 04/14/2021   MCV 80.9 04/14/2021   PLT  303.0 04/14/2021   Lab Results  Component Value Date   NA 141 04/14/2021   K 3.5 04/14/2021   CO2 31 04/14/2021   GLUCOSE 98 04/14/2021   BUN 17 04/14/2021   CREATININE 0.79 04/14/2021   BILITOT 0.4 04/14/2021   ALKPHOS 92 04/14/2021   AST 16 04/14/2021   ALT 11 04/14/2021   PROT 6.5 04/14/2021   ALBUMIN 4.3 04/14/2021   CALCIUM 9.0 04/14/2021   ANIONGAP 8 04/11/2015   EGFR 69 01/21/2021   GFR 70.32 04/14/2021   Lab Results  Component Value Date   CHOL 110 02/26/2021   Lab Results  Component Value Date   HDL 66 02/26/2021   Lab Results  Component Value Date   LDLCALC 28 02/26/2021   Lab Results  Component Value Date   TRIG 80 02/26/2021   Lab Results  Component Value Date   CHOLHDL 1.7 02/26/2021   Lab Results  Component Value Date   HGBA1C 5.8 (A) 02/07/2021       Assessment & Plan:   Problem List Items Addressed This Visit       Respiratory   Acute recurrent frontal sinusitis - Primary    Patient was seen virtually approximately 2 to 2-1/2 weeks ago.  She was placed on a Z-Pak.  States that it did not really help.  States that she felt like she was getting better and then had in the other direction is a stable since then.  Patient has a host of allergies.  Was going to place patient on doxycycline but she has intolerance. Did elect to use cefdinir. Patient has intolerances to PCn and Amox (N/V). No rash or trouble breathing. S&S reviewed as when to seek emergency care.      Relevant Medications   cefdinir (OMNICEF) 300 MG capsule     Nervous and Auditory   Impacted cerumen of right ear   Relevant Orders   Ear Lavage     Other   Dizziness    History of the same. Has tried Meclizine and OTC antihistamine. No causative factor per patient report. She did have a cerumen impaction. Will continue to monitor and she can continue using OTC meclizine. Neuro exam intact.        No orders of the defined  types were placed in this encounter.  This visit  occurred during the SARS-CoV-2 public health emergency.  Safety protocols were in place, including screening questions prior to the visit, additional usage of staff PPE, and extensive cleaning of exam room while observing appropriate contact time as indicated for disinfecting solutions.   Romilda Garret, NP

## 2021-04-21 NOTE — Patient Instructions (Signed)
Continue taking the antihistamine and flonase. Start the antibiotic. Follow up if symptoms fail to improve or worsen

## 2021-04-21 NOTE — Assessment & Plan Note (Signed)
Patients BP elevated in office. She will monitor it at home and has already taken her BP medications for the day.

## 2021-04-22 ENCOUNTER — Telehealth: Payer: Self-pay | Admitting: Family Medicine

## 2021-04-22 NOTE — Chronic Care Management (AMB) (Signed)
  Chronic Care Management   Note  04/22/2021 Name: Catherine Green MRN: [id number] DOB: Sep 27, 1940  Catherine Green is a 80 y.o. year old female who is a primary care patient of Tonia Ghent, MD. I reached out to Stephenie Acres by phone today in response to a referral sent by Ms. Mayme Genta PCP, Tonia Ghent, MD.   [name] was given information about Chronic Care Management services today including:  CCM service includes personalized support from designated clinical staff supervised by her physician, including individualized plan of care and coordination with other care providers 24/7 contact phone numbers for assistance for urgent and routine care needs. Service will only be billed when office clinical staff spend 20 minutes or more in a month to coordinate care. Only one practitioner may furnish and bill the service in a calendar month. The patient may stop CCM services at any time (effective at the end of the month) by phone call to the office staff.   Patient agreed to services and verbal consent obtained.   Follow up plan:   Tatjana Secretary/administrator

## 2021-05-06 ENCOUNTER — Other Ambulatory Visit: Payer: Self-pay | Admitting: Family

## 2021-05-10 ENCOUNTER — Ambulatory Visit (INDEPENDENT_AMBULATORY_CARE_PROVIDER_SITE_OTHER): Payer: Medicare HMO | Admitting: Family Medicine

## 2021-05-10 ENCOUNTER — Other Ambulatory Visit: Payer: Self-pay

## 2021-05-10 VITALS — BP 145/61 | HR 57

## 2021-05-10 DIAGNOSIS — Z0000 Encounter for general adult medical examination without abnormal findings: Secondary | ICD-10-CM | POA: Diagnosis not present

## 2021-05-10 NOTE — Progress Notes (Signed)
Subjective:   Catherine Green is a 80 y.o. female who presents for an Initial Medicare Annual Wellness Visit.  Review of Systems    I connected with  Stephenie Acres on 05/10/21 by a audio enabled telemedicine application and verified that I am speaking with the correct person using two identifiers.   I discussed the limitations of evaluation and management by telemedicine. The patient expressed understanding and agreed to proceed.       Objective:    Today's Vitals   05/10/21 1001  BP: (!) 145/61  Pulse: (!) 57  PainSc: 0-No pain   There is no height or weight on file to calculate BMI.  Advanced Directives 06/27/2019 01/31/2019 01/11/2018 02/17/2017 01/07/2017 02/18/2016 08/20/2015  Does Patient Have a Medical Advance Directive? Yes Yes Yes Yes Yes Yes Yes  Type of Paramedic of [place];Living will [place];Living will [place];Living will [place];Living will [place];Living will Living will;Healthcare Power of [place];Living will  Does patient want to make changes to medical advance directive? No - Patient declined No - Patient declined - - - - -  Copy of [place] in Chart? - No - copy requested No - copy requested - No - copy requested No - copy requested -  Would patient like information on creating a medical advance directive? - No - Patient declined - - - - -    Current Medications (verified) Outpatient Encounter Medications as of 05/10/2021  Medication Sig   aspirin 81 MG tablet Take 1 tablet (81 mg total) by mouth daily.   carvedilol (COREG) 6.25 MG tablet Take 1 tablet (6.25 mg total) by mouth 2 (two) times daily.   Coenzyme Q10 (CO Q10) 100 MG CAPS Take 100 mg by mouth daily.   ezetimibe (ZETIA) 10 MG tablet TAKE ONE TABLET EVERY DAY   furosemide (LASIX) 20 MG tablet Take 1 tablet ('20mg'$ ) Every Other Day alternating with 2  tablets ('40mg'$ ) Every Other Day   gabapentin (NEURONTIN) 400 MG capsule TAKE 1 CAPSULE BY MOUTH EVERY 8 HOURS   loratadine (CLARITIN) 10 MG tablet Take 10 mg by mouth daily.   omeprazole (PRILOSEC) 20 MG capsule Take 1 capsule (20 mg total) by mouth daily.   potassium chloride (KLOR-CON) 10 MEQ tablet Take 10 mEq by mouth daily.   potassium chloride (KLOR-CON) 10 MEQ tablet Take 1 tablet (10 mEq total) by mouth daily.   sertraline (ZOLOFT) 100 MG tablet TAKE 1 TABLET BY MOUTH ONCE DAILY.   traMADol (ULTRAM) 50 MG tablet Take 1 tablet (50 mg total) by mouth in the morning.   vitamin B-12 (CYANOCOBALAMIN) 1000 MCG tablet Take 1 tablet (1,000 mcg total) by mouth daily.   fluticasone (FLONASE) 50 MCG/ACT nasal spray Place 1 spray into both nostrils daily. (Patient not taking: Reported on 05/10/2021)   rosuvastatin (CRESTOR) 20 MG tablet Take 1 tablet (20 mg total) by mouth daily.   No facility-administered encounter medications on file as of 05/10/2021.    Allergies (verified) Lyrica [pregabalin], Hydrocodone, Ivp dye [iodinated diagnostic agents], Lipitor [atorvastatin], Oxycodone hcl, Penicillin g, Shellfish allergy, Clavulanic acid, Prednisone, Augmentin [amoxicillin-pot clavulanate], Codeine, Cymbalta [duloxetine hcl], Demerol, Doxycycline, Effexor [venlafaxine], Hydrochlorothiazide, Hydroxyzine, Metoprolol, Protonix [pantoprazole sodium], and Trazodone and nefazodone   History: Past Medical History:  Diagnosis Date   Allergy    hay fever   Anemia    Anxiety    Back pain  Carotid artery occlusion    Cerebrovascular disease    extracranial; occlusive   Chicken pox    Coronary artery disease    Depression    Dizziness    Dizziness    DVT (deep venous thrombosis) (HCC)    Fainting spell    GERD (gastroesophageal reflux disease)    Headache    Heart murmur    Hyperlipidemia    Hypertension    Leg pain    PONV (postoperative nausea and vomiting)    severe nausea and vomiting    Pre-syncope    PVD (peripheral vascular disease) ([place])    endarterectomy by [name]   Seasonal allergies    Shortness of breath dyspnea    wth ambulation at times   Swelling of both ankles    and abdomen; takes Lasix when needed   Thrombophlebitis    following childbirth   Ulcer    Past Surgical History:  Procedure Laterality Date   APPENDECTOMY     BACK SURGERY  01/14/16   BREAST SURGERY     saline implants   CARDIAC CATHETERIZATION     CAROTID ENDARTERECTOMY  1992   CAROTID ENDARTERECTOMY Right February 13, 2005   Re-do Right CE   CATARACT EXTRACTION, BILATERAL  2013   CHOLECYSTECTOMY     CHOLECYSTECTOMY, LAPAROSCOPIC  04/02/14   Dr. Rochel Brome   CORONARY ARTERY BYPASS GRAFT  2006   x3 Dr. Prescott Gum   ENDARTERECTOMY Left 07/20/2014   Procedure: ENDARTERECTOMY CAROTID-LEFT;  Surgeon: Rosetta Posner, MD;  Location: [facility];  Service: Vascular;  Laterality: Left;   EYE SURGERY Bilateral Feb. 2013   Cataract Left eye   SEPTOPLASTY     with bilateral inferior turbinate reductions   SPINE SURGERY  12/2015   TOTAL ABDOMINAL HYSTERECTOMY     UPPER GASTROINTESTINAL ENDOSCOPY     had polyps removed from esophagus   UPPER GI ENDOSCOPY     Family History  Problem Relation Age of Onset   Aneurysm Mother        brain   Heart disease Mother        Aneyursm    Hyperlipidemia Mother    Hypertension Mother    Varicose Veins Mother    Bleeding Disorder Mother    Heart disease Father    Cirrhosis Father    Heart attack Father    Hyperlipidemia Father    Hypertension Father    Heart disease Brother        Before age 76   Aneurysm Brother    Deep vein thrombosis Brother    Birth defects Brother    Hyperlipidemia Brother    Hypertension Brother    Hypertension Other    Cancer Sister        lung   Heart disease Sister        Aneurysm   Hyperlipidemia Sister    Hypertension Sister    Varicose Veins Sister    Peripheral vascular disease Daughter    Hyperlipidemia Daughter     Hypertension Daughter    Bleeding Disorder Sister    Breast cancer Neg Hx    Colon cancer Neg Hx    Social History   Socioeconomic History   Marital status: Married    Spouse name: Jeneen Rinks    Number of children: 1   Years of education: Not on file   Highest education level: 11th grade  Occupational History    Comment: Retired   Tobacco Use   Smoking  status: Former    Packs/day: 0.30    Years: 15.00    Pack years: 4.50    Types: Cigarettes    Quit date: 08/31/1996    Years since quitting: 24.7   Smokeless tobacco: Never  Vaping Use   Vaping Use: Never used  Substance and Sexual Activity   Alcohol use: No    Alcohol/week: 0.0 standard drinks   Drug use: No   Sexual activity: Not Currently  Other Topics Concern   Not on file  Social History Narrative   Lives with husband   Work - retired Emergency planning/management officer   Diet - healthy   Right handed    Caffeine- 1 cup per day.   Social Determinants of Health   Financial Resource Strain: Not on file  Food Insecurity: Not on file  Transportation Needs: Not on file  Physical Activity: Not on file  Stress: Not on file  Social Connections: Not on file    Tobacco Counseling Counseling given: Not Answered   Clinical Intake:     Pain Score: 0-No pain           Diabetic? No.          Activities of Daily Living In your present state of health, do you have any difficulty performing the following activities: 02/07/2021  Hearing? Y  Comment patient has hearing aids  Vision? N  Difficulty concentrating or making decisions? N  Walking or climbing stairs? Y  Dressing or bathing? N  Doing errands, shopping? Y  Comment patient doesn't prefer to go anywhere alone  Some recent data might be hidden    Patient Care Team: Tonia Ghent, MD as PCP - General (Family Medicine) Rockey Situ, Kathlene November, MD as PCP - Cardiology (Cardiology) Rockey Situ, Kathlene November, MD (Cardiology) Earnie Larsson, MD as Consulting Physician  (Neurosurgery) Debbora Dus, [facility] as Pharmacist (Pharmacist)  Indicate any recent Medical Services you may have received from other than [facility] providers in the past year (date may be approximate).     Assessment:   This is a routine wellness examination for Ahlayah.  Hearing/Vision screen No results found.  Dietary issues and exercise activities discussed:     Goals Addressed   None    Depression Screen PHQ 2/9 Scores 05/10/2021 05/10/2021 05/10/2021 02/07/2021 01/31/2019 01/11/2018 01/07/2017  PHQ - 2 Score 0 0 0 6 3 0 6  PHQ- 9 Score 0 0 0 12 8 0 15    Fall Risk Fall Risk  02/07/2021 09/13/2020 01/31/2019 01/11/2018 01/07/2017  Falls in the past year? 0 1 0 No No  Number falls in past yr: 0 1 - - -  Injury with Fall? - 1 - - -  Risk for fall due to : - History of fall(s) - - -  Follow up - Falls evaluation completed - - -    FALL RISK PREVENTION PERTAINING TO THE HOME:  Any stairs in or around the home? No  If so, are there any without handrails? No  Home free of loose throw rugs in walkways, pet beds, electrical cords, etc? No  Adequate lighting in your home to reduce risk of falls? Yes   ASSISTIVE DEVICES UTILIZED TO PREVENT FALLS:  Life alert? No  Use of a cane, walker or w/c? No  Grab bars in the bathroom? No  Shower chair or bench in shower? No  Elevated toilet seat or a handicapped toilet? Yes   TIMED UP AND GO:  Was the test performed? No .  Length of time to ambulate 10 feet: n/a sec.   Gait slow and steady without use of assistive device  Cognitive Function: MMSE - Mini Mental State Exam 01/31/2019 01/11/2018 01/07/2017  Orientation to time '5 5 5  '$ Orientation to Place '5 5 5  '$ Registration '3 3 3  '$ Attention/ Calculation 0 0 0  Recall '3 3 3  '$ Language- name 2 objects 0 0 0  Language- repeat '1 1 1  '$ Language- follow 3 step command 0 3 3  Language- read & follow direction 0 0 0  Write a sentence 0 0 0  Copy design 0 0 0  Total score '17 20 20         '$ Immunizations Immunization History  Administered Date(s) Administered   Fluad Quad(high Dose 65+) 05/11/2019, 07/08/2020   Influenza Split 05/27/2012   Influenza Whole 06/20/2013   Influenza, High Dose Seasonal PF 05/27/2018   Influenza, Seasonal, Injecte, Preservative Fre 06/24/2016   Influenza,inj,Quad PF,6+ Mos 06/14/2014, 06/07/2015, 05/24/2017   PFIZER(Purple Top)SARS-COV-2 Vaccination 09/05/2019, 09/26/2019, 06/12/2020   Pneumococcal Conjugate-13 01/07/2017   Pneumococcal Polysaccharide-23 03/11/2011   Tdap 05/27/2012   Zoster, Live 03/11/2011    TDAP status: Up to date  Flu Vaccine status: Due, Education has been provided regarding the importance of this vaccine. Advised may receive this vaccine at local pharmacy or Health Dept. Aware to provide a copy of the vaccination record if obtained from local pharmacy or Health Dept. Verbalized acceptance and understanding.  Pneumococcal vaccine status: Up to date  Covid-19 vaccine status: Completed vaccines  Qualifies for Shingles Vaccine? Yes   Zostavax completed No   Shingrix Completed?: No.    Education has been provided regarding the importance of this vaccine. Patient has been advised to call insurance company to determine out of pocket expense if they have not yet received this vaccine. Advised may also receive vaccine at local pharmacy or Health Dept. Verbalized acceptance and understanding.  Screening Tests Health Maintenance  Topic Date Due   Zoster Vaccines- Shingrix (1 of 2) Never done   COVID-19 Vaccine (4 - Booster for Pfizer series) 10/13/2020   INFLUENZA VACCINE  03/31/2021   MAMMOGRAM  01/07/2026 (Originally 03/10/2012)   DEXA SCAN  01/07/2026 (Originally 03/12/2005)   TETANUS/TDAP  05/27/2022   PNA vac Low Risk Adult  Completed   HPV VACCINES  Aged Out    Health Maintenance  Health Maintenance Due  Topic Date Due   Zoster Vaccines- Shingrix (1 of 2) Never done   COVID-19 Vaccine (4 - Booster for Pfizer  series) 10/13/2020   INFLUENZA VACCINE  03/31/2021    Colorectal cancer screening: No longer required.   Mammogram status: Completed n/a. Repeat every year   Bone Density status: Completed n/a. Results reflect: Bone density results: NORMAL. Repeat every n/a years. Lung Cancer Screening: (Low Dose CT Chest recommended if Age 65-80 years, 30 pack-year currently smoking OR have quit w/in 15years.) does not qualify.   Lung Cancer Screening Referral: n/a  Additional Screening:  Hepatitis C Screening: does not qualify; Completed n/a   Vision Screening: Recommended annual ophthalmology exams for early detection of glaucoma and other disorders of the eye. Is the patient up to date with their annual eye exam?  Yes  Who is the provider or what is the name of the office in which the patient attends annual eye exams? [facility] op[facility]  If pt is not established with a provider, would they like to be referred to a provider to establish care?  Pt  currently established .   Dental Screening: Recommended annual dental exams for proper oral hygiene  Community Resource Referral / Chronic Care Management: CRR required this visit?  No   CCM required this visit?  No      Plan:     I have personally reviewed and noted the following in the patient's chart:   Medical and social history Use of alcohol, tobacco or illicit drugs  Current medications and supplements including opioid prescriptions. Patient is currently taking opioid prescriptions. Information provided to patient regarding non-opioid alternatives. Patient advised to discuss non-opioid treatment plan with their provider. Functional ability and status Nutritional status Physical activity Advanced directives List of other physicians Hospitalizations, surgeries, and ER visits in previous 12 months Vitals Screenings to include cognitive, depression, and falls Referrals and appointments  In addition, I have reviewed and discussed  with patient certain preventive protocols, quality metrics, and best practice recommendations. A written personalized care plan for preventive services as well as general preventive health recommendations were provided to patient.     Debbora Dus, [place]   05/10/2021   Nurse Notes: Successful.

## 2021-05-19 ENCOUNTER — Other Ambulatory Visit: Payer: Self-pay

## 2021-05-19 MED ORDER — ROSUVASTATIN CALCIUM 20 MG PO TABS: 20.0000 mg | ORAL_TABLET | Freq: Every day | ORAL | 0 refills | Status: DC

## 2021-06-09 ENCOUNTER — Telehealth: Payer: Self-pay

## 2021-06-09 NOTE — Chronic Care Management (AMB) (Addendum)
Chronic Care Management Pharmacy Assistant   Name: Catherine Green  MRN: [id number] DOB: [birth year]  Catherine Green is an 80 y.o. year old female who presents for his initial CCM visit with the clinical pharmacist.  Reason for Encounter:  Initial Questions    Conditions to be addressed/monitored: CHF, CAD, HTN, and HLD   Recent office visits:  05/10/21-PCP-Telemedicine-Patient presented for AWV. No medication changes, patient up to date with vaccines, follow up 1 year 04/21/21-Family Medicine-Patient presented for sinus pain and ear pain.started cefdinir 300mg  , use OTC meclizine,take antihistamine and flonase,Follow up if symptoms fail to improve or worsen 02/07/21-PCP-Patient presented with skin lesions, Not diabetic by A1c, 5.8. Referral to dermatology,try gabapentin again for scalp sensations.Likely lipomas- benign fatty deposits on the skin.   Recent consult visits:  02/26/21-Cardiology-Patient presented for follow up coronary artery disease.EKG,lipid panel(cholesterol at goal) no medication changes. 01/21/21-Cardiology-Patient presented for follow up chronic heart disease. Due to financial concerns she would like to undergo a rechallenge of Crestor in an effort to possibly come off of Repatha.  She does note significant pruritus at her Zio patch location. Start Rosuvastatin 20mg  follow up 1 month. 01/14/21-Cardiology-Patient presented for follow up hypertension.Labs ordered,EKG,Plan for 14-day ZIO monitor,(Kidney function mild decline)change furosemide to alternate 20mg  and 40mg  every other day,change potassium to alternate 19meq and 29meq every other day. 01/08/21-Cardiology-Patient presented for shortness of breath.   Hospital visits:  None in previous 6 months  Medications: Outpatient Encounter Medications as of 06/09/2021  Medication Sig   aspirin 81 MG tablet Take 1 tablet (81 mg total) by mouth daily.   carvedilol (COREG) 6.25 MG tablet Take 1 tablet (6.25 mg total) by mouth  2 (two) times daily.   Coenzyme Q10 (CO Q10) 100 MG CAPS Take 100 mg by mouth daily.   ezetimibe (ZETIA) 10 MG tablet TAKE ONE TABLET EVERY DAY   fluticasone (FLONASE) 50 MCG/ACT nasal spray Place 1 spray into both nostrils daily. (Patient not taking: Reported on 05/10/2021)   furosemide (LASIX) 20 MG tablet Take 1 tablet (20mg ) Every Other Day alternating with 2 tablets (40mg ) Every Other Day   gabapentin (NEURONTIN) 400 MG capsule TAKE 1 CAPSULE BY MOUTH EVERY 8 HOURS   loratadine (CLARITIN) 10 MG tablet Take 10 mg by mouth daily.   omeprazole (PRILOSEC) 20 MG capsule Take 1 capsule (20 mg total) by mouth daily.   potassium chloride (KLOR-CON) 10 MEQ tablet Take 10 mEq by mouth daily.   potassium chloride (KLOR-CON) 10 MEQ tablet Take 1 tablet (10 mEq total) by mouth daily.   rosuvastatin (CRESTOR) 20 MG tablet Take 1 tablet (20 mg total) by mouth daily.   sertraline (ZOLOFT) 100 MG tablet TAKE 1 TABLET BY MOUTH ONCE DAILY.   traMADol (ULTRAM) 50 MG tablet Take 1 tablet (50 mg total) by mouth in the morning.   vitamin B-12 (CYANOCOBALAMIN) 1000 MCG tablet Take 1 tablet (1,000 mcg total) by mouth daily.   No facility-administered encounter medications on file as of 06/09/2021.    Lab Results  Component Value Date/Time   HGBA1C 5.8 (A) 02/07/2021 09:25 AM   HGBA1C 5.8 (A) 11/13/2019 09:26 AM   HGBA1C 7.1 (H) 02/01/2019 09:07 AM   HGBA1C 6.6 (H) 11/03/2018 10:57 AM   MICROALBUR 11.7 (H) 11/03/2018 10:57 AM     BP Readings from Last 3 Encounters:  05/10/21 (!) 145/61  04/21/21 (!) 166/58  02/26/21 (!) 122/50    Patient contacted to review initial questions prior to visit with  Debbora Dus.  Have you seen any other providers since your last visit with PCP? No  Any changes in your medications or health? Yes The patient reports she feels bad in the mornings with fatique and questions if this is related to medications.  Any side effects from any medications? No  Do you have an  symptoms or problems not managed by your medications? No  Any concerns about your health right now? Yes She has no energy in the morning time  Has your provider asked that you check blood pressure, blood sugar, or follow special diet at home? Yes   The patient does have BP monitor at home   Do you get any type of exercise on a regular basis? No  Can you think of a goal you would like to reach for your health? Yes The patient would like to have more energy in the morning  Do you have any problems getting your medications? No she uses [facility] in [place].   Is there anything that you would like to discuss during the appointment? No   Catherine Green was reminded to have all medications, supplements and any blood glucose and blood pressure readings available for review with Debbora Dus, Pharm. D, at her telephone visit on 06/16/21 at 11:00am.    Star Rating Drugs:  Medication:  Last Fill: Day Supply Rosuvastatin 20mg  04/18/21 100  Care Gaps: Annual wellness visit in last year? 05/10/21 Most Recent BP reading:  145/61  57-P  Debbora Dus, CPP notified  Avel Sensor, [place] Assistant [phone]  I have reviewed the care management and care coordination activities outlined in this encounter and I am certifying that I agree with the content of this note. No further action required.  Debbora Dus, PharmD Clinical Pharmacist [facility] at [facility] [phone]

## 2021-06-16 ENCOUNTER — Telehealth: Payer: Self-pay

## 2021-06-16 ENCOUNTER — Ambulatory Visit (INDEPENDENT_AMBULATORY_CARE_PROVIDER_SITE_OTHER): Payer: Medicare HMO

## 2021-06-16 ENCOUNTER — Other Ambulatory Visit: Payer: Self-pay

## 2021-06-16 DIAGNOSIS — E782 Mixed hyperlipidemia: Secondary | ICD-10-CM

## 2021-06-16 DIAGNOSIS — I10 Essential (primary) hypertension: Secondary | ICD-10-CM

## 2021-06-16 DIAGNOSIS — F32A Anxiety disorder, unspecified: Secondary | ICD-10-CM

## 2021-06-16 DIAGNOSIS — F419 Anxiety disorder, unspecified: Secondary | ICD-10-CM

## 2021-06-16 MED ORDER — SERTRALINE HCL 100 MG PO TABS: 150.0000 mg | ORAL_TABLET | Freq: Every day | ORAL | 1 refills | Status: DC

## 2021-06-16 NOTE — Patient Instructions (Signed)
June 16, 2021  Dear Stephenie Acres,  It was a pleasure meeting you during our initial appointment on June 16, 2021. Below is a summary of the goals we discussed and components of chronic care management. Please contact me anytime with questions or concerns.   Visit Information  Patient Care Plan: CCM Pharmacy Care Plan     Problem Identified: CHL AMB "PATIENT-SPECIFIC PROBLEM"   Onset Date: 06/16/2021  Note:     Current Barriers:  Suboptimal therapeutic regimen for depression - per patient report  Pharmacist Clinical Goal(s):  Patient will contact provider office for questions/concerns as evidenced notation of same in electronic health record through collaboration with PharmD and provider.   Interventions: 1:1 collaboration with Tonia Ghent, MD regarding development and update of comprehensive plan of care as evidenced by provider attestation and co-signature Inter-disciplinary care team collaboration (see longitudinal plan of care) Comprehensive medication review performed; medication list updated in electronic medical record  Hypertension (BP goal <140/90) -Not ideally controlled - last 2 clinic readings elevated  -Managed by cardiology  -Current treatment: Carvedilol 6.25 mg - 1 tablet twice daily (increased dose 6/22) -Medications previously tried: amlodipine - stopped 6/22  -Current home readings: today - 8:30 AM - 153/58, 53, repeated at 10 AM - 142/47, 53 -Denies hypotensive/hypertensive symptoms - she states the heart palpitations have improved with carvedilol dose increase  -She feel weak and nauseated after morning medications. -Educated on BP goals and benefits of medications for prevention of heart attack, stroke and kidney damage; -Counseled to monitor BP at home this week, document, and provide log at future appointments -Recommended to continue current medication; Follow up call 1 week for BP log.  Hyperlipidemia/CAD/PAD (LDL goal < 70) -Controlled -  LDL within goal  -Current treatment: Zetia 10 mg - 1 tablet daily Rosuvastatin 20 mg - 1 tablet daily  Aspirin 81 mg - 1 tablet daily -Medications previously tried: Repatha (Cost), simvastatin, atorvastatin -Educated on Cholesterol goals;  Benefits of statin for ASCVD risk reduction; -Recommended to continue current medication  Heart Failure (Goal: manage symptoms and prevent exacerbations) -Controlled -Last ejection fraction: Echocardiogram normal ejection fraction, diastolic dysfunction (Date: April 2021) -HF type: Diastolic -NYHA Class: I (no actitivty limitation) -AHA HF Stage: B (Heart disease present - no symptoms present) -Current treatment: Lasix 20 mg - alternate with 40 mg every other day Potassium chloride 10 mEq - 1 tablet daily  -Medications previously tried: none   -Denies swelling with Lasix  -Educated on Proper diuretic administration and potassium supplementation -Recommended to continue current medication  Depression/Anxiety (Goal: Improve mood) -Uncontrolled -States she feels very depressed. Good days and bad days, low energy, does not want to do anything. -Overwhelmed by grief - loss of her daughter 2020 and multiple family members this July 2022 -Current treatment: Sertraline 100 mg - 1 tablet daily (increase dose 02/2020) -Medications previously tried/failed: venlafaxine  -PHQ9: 0 (05/10/21) -GAD7: n/a -She is not sure she wants to see a counselor at this time, caregiver for husband  -Educated on Benefits of medication for symptom control Benefits of cognitive-behavioral therapy with or without medication -Collaborated with PCP; See telephone note.   GERD (Goal: Improve symptoms) -Controlled -Current treatment  Pepcid - 1 tablet daily at bedtime -Medications previously tried: omeprazole - patient discontinued due to  Pepcid more effective -Recommended to continue current medication  Patient Goals/Self-Care Activities Patient will:  - take medications  as prescribed  Follow Up Plan: Telephone follow up appointment with care management team  member scheduled for: 1 month     Ms. Cagley was given information about Chronic Care Management services today including:  CCM service includes personalized support from designated clinical staff supervised by her physician, including individualized plan of care and coordination with other care providers 24/7 contact phone numbers for assistance for urgent and routine care needs. Standard insurance, coinsurance, copays and deductibles apply for chronic care management only during months in which we provide at least 20 minutes of these services. Most insurances cover these services at 100%, however patients may be responsible for any copay, coinsurance and/or deductible if applicable. This service may help you avoid the need for more expensive face-to-face services. Only one practitioner may furnish and bill the service in a calendar month. The patient may stop CCM services at any time (effective at the end of the month) by phone call to the office staff.  Patient agreed to services and verbal consent obtained.   Patient verbalizes understanding of instructions provided today and agrees to view in [place].   Debbora Dus, PharmD Clinical Pharmacist [facility] at [facility] [phone]

## 2021-06-16 NOTE — Telephone Encounter (Signed)
Patient reports increase in depressed mood lately. Does not feel sertraline is helping anymore. She declines scheduling a visit right now due to caring for husband. She is open to a dose increase. We discussed trial of sertraline 150 mg daily. Please let me know your thoughts.  Debbora Dus, PharmD Clinical Pharmacist [facility] at [facility] [phone]

## 2021-06-16 NOTE — Telephone Encounter (Signed)
I would try inc sertraline to 150mg  a day.  New rx sent.  1.5 tabs of the 100mg  strength daily.  Let me know if that isn't helping or if not tolerated.  It may take a few weeks to have effect.  If worse then the meantime, then please have her update [place].  Thanks.

## 2021-06-16 NOTE — Telephone Encounter (Signed)
Patient notified of dose change on sertraline. Patient verbalized understanding and advised to call if does not help and if she does not tolerate change.

## 2021-06-16 NOTE — Progress Notes (Signed)
Chronic Care Management Pharmacy Note  06/16/2021 Name:  Catherine Green MRN:  [id number] DOB:  03-Jan-1941  Summary: Pt reports depressed mood, not seeing benefit with sertraline at this time. She also reports fatigue and nausea with morning meds. Home BP elevated today, she will keep a log this week.   Recommendations/Changes made from today's visit: Suggest trial 150 mg sertraline, try taking at bedtime. Pending PCP consultation.  Plan: CCM call BP log - 1 week CCM visit - 1 month   Subjective: Catherine Green is an 80 y.o. year old female who is a primary patient of Damita Dunnings, Elveria Rising, MD.  The CCM team was consulted for assistance with disease management and care coordination needs.    Engaged with patient by telephone for initial visit in response to provider referral for pharmacy case management and/or care coordination services.   Consent to Services:  The patient was given the following information about Chronic Care Management services today, agreed to services, and gave verbal consent: 1. CCM service includes personalized support from designated clinical staff supervised by the primary care provider, including individualized plan of care and coordination with other care providers 2. 24/7 contact phone numbers for assistance for urgent and routine care needs. 3. Service will only be billed when office clinical staff spend 20 minutes or more in a month to coordinate care. 4. Only one practitioner may furnish and bill the service in a calendar month. 5.The patient may stop CCM services at any time (effective at the end of the month) by phone call to the office staff. 6. The patient will be responsible for cost sharing (co-pay) of up to 20% of the service fee (after annual deductible is met). Patient agreed to services and consent obtained.  Patient Care Team: Tonia Ghent, MD as PCP - General (Family Medicine) Rockey Situ Kathlene November, MD as PCP - Cardiology (Cardiology) Minna Merritts, MD (Cardiology) Earnie Larsson, MD as Consulting Physician (Neurosurgery) Debbora Dus, [facility] as Pharmacist (Pharmacist)  Recent office visits:  05/10/21-Family Medicine- Patient presented for AWV. No medication changes, patient up to date with vaccines, follow up 1 year. 04/21/21-Family Medicine-Patient presented for sinus pain and ear pain.started cefdinir 34m , use OTC meclizine,take antihistamine and flonase, up if symptoms fail to improve or worsen. 02/07/21-PCP-Patient presented with skin lesions, Not diabetic by A1c, 5.8. Referral to dermatology,try gabapentin again for scalp sensations.Likely lipomas- benign fatty deposits on the skin.    Recent consult visits:  02/26/21-Cardiology-Patient presented for follow up coronary artery disease.EKG,lipid panel(cholesterol at goal) no medication changes. 02/07/21 - Cardiology, phone call - Recommendations:-Stop amlodipine -Increase Coreg to 6.25 mg bid to decrease PVC burden -Follow up as planned, will check magnesium at her next visit. 01/21/21-Cardiology-Patient presented for follow up chronic heart disease. Due to financial concerns she would like to undergo a rechallenge of Crestor in an effort to possibly come off of Repatha.  She does note significant pruritus at her Zio patch location. Start Rosuvastatin 235mfollow up 1 month. 01/14/21-Cardiology-Patient presented for follow up hypertension.Labs ordered,EKG,Plan for 14-day ZIO monitor,(Kidney function mild decline)change furosemide to alternate 2022mnd 38m56mery other day,change potassium to alternate 10me13md 20meq73mry other day. 01/08/21-Cardiology-Patient presented for shortness of breath.     Hospital visits:  None in previous 6 months   Objective:  Lab Results  Component Value Date   CREATININE 0.79 04/14/2021   BUN 17 04/14/2021   GFR 70.32 04/14/2021   GFRNONAA 63 07/29/2020   GFRAA  73 07/29/2020   NA 141 04/14/2021   K 3.5 04/14/2021   CALCIUM 9.0 04/14/2021   CO2 31  04/14/2021   GLUCOSE 98 04/14/2021    Lab Results  Component Value Date/Time   HGBA1C 5.8 (A) 02/07/2021 09:25 AM   HGBA1C 5.8 (A) 11/13/2019 09:26 AM   HGBA1C 7.1 (H) 02/01/2019 09:07 AM   HGBA1C 6.6 (H) 11/03/2018 10:57 AM   GFR 70.32 04/14/2021 11:29 AM   GFR 63.51 01/22/2020 10:01 AM   MICROALBUR 11.7 (H) 11/03/2018 10:57 AM     Lab Results  Component Value Date   CHOL 110 02/26/2021   HDL 66 02/26/2021   LDLCALC 28 02/26/2021   LDLDIRECT 139.1 04/11/2012   TRIG 80 02/26/2021   CHOLHDL 1.7 02/26/2021    Hepatic Function Latest Ref Rng & Units 04/14/2021 01/22/2020 11/27/2019  Total Protein 6.0 - 8.3 g/dL 6.5 6.6 6.1  Albumin 3.5 - 5.2 g/dL 4.3 4.4 4.2  AST 0 - 37 U/L 16 15 14   ALT 0 - 35 U/L 11 10 11   Alk Phosphatase 39 - 117 U/L 92 104 91  Total Bilirubin 0.2 - 1.2 mg/dL 0.4 0.7 0.6  Bilirubin, Direct 0.0 - 0.3 mg/dL - - -    Lab Results  Component Value Date/Time   TSH 1.150 07/29/2020 11:13 AM   TSH 1.06 04/22/2018 09:23 AM    CBC Latest Ref Rng & Units 04/14/2021 01/14/2021 07/29/2020  WBC 4.0 - 10.5 K/uL 6.1 7.0 6.5  Hemoglobin 12.0 - 15.0 g/dL 11.7(L) 11.9 12.6  Hematocrit 36.0 - 46.0 % 35.8(L) 36.8 38.5  Platelets 150.0 - 400.0 K/uL 303.0 358 318    No results found for: VD25OH  Clinical ASCVD: Yes  The ASCVD Risk score (Arnett DK, et al., 2019) failed to calculate for the following reasons:   The 2019 ASCVD risk score is only valid for ages 40 to 94    Depression screen PHQ 2/9 05/10/2021 05/10/2021 05/10/2021  Decreased Interest 0 0 0  Down, Depressed, Hopeless 0 0 0  PHQ - 2 Score 0 0 0  Altered sleeping 0 0 0  Tired, decreased energy 0 0 0  Change in appetite 0 0 0  Feeling bad or failure about yourself  0 - 0  Trouble concentrating 0 0 0  Moving slowly or fidgety/restless 0 0 0  Suicidal thoughts 0 0 0  PHQ-9 Score 0 0 0  Difficult doing work/chores Not difficult at all Not difficult at all Not difficult at all  Some recent data might be  hidden   Social History   Tobacco Use  Smoking Status Former   Packs/day: 0.30   Years: 15.00   Pack years: 4.50   Types: Cigarettes   Quit date: 08/31/1996   Years since quitting: 24.8  Smokeless Tobacco Never   BP Readings from Last 3 Encounters:  05/10/21 (!) 145/61  04/21/21 (!) 166/58  02/26/21 (!) 122/50   Pulse Readings from Last 3 Encounters:  05/10/21 (!) 57  04/21/21 62  02/26/21 60   Wt Readings from Last 3 Encounters:  04/21/21 144 lb (65.3 kg)  04/01/21 139 lb (63 kg)  02/26/21 145 lb 2 oz (65.8 kg)   BMI Readings from Last 3 Encounters:  04/21/21 26.34 kg/m  04/01/21 25.42 kg/m  02/26/21 26.54 kg/m    Assessment/Interventions: Review of patient past medical history, allergies, medications, health status, including review of consultants reports, laboratory and other test data, was performed as part of comprehensive evaluation and  provision of chronic care management services.   SDOH:  (Social Determinants of Health) assessments and interventions performed: Yes SDOH Interventions    Flowsheet Row Most Recent Value  SDOH Interventions   Financial Strain Interventions Intervention Not Indicated      SDOH Screenings   Alcohol Screen: Not on file  Depression (PHQ2-9): Low Risk    PHQ-2 Score: 0  Financial Resource Strain: Low Risk    Difficulty of Paying Living Expenses: Not very hard  Food Insecurity: Not on file  Housing: Not on file  Physical Activity: Not on file  Social Connections: Not on file  Stress: Not on file  Tobacco Use: Medium Risk   Smoking Tobacco Use: Former   Smokeless Tobacco Use: Never  Transportation Needs: Not on file    [place]  Allergies  Allergen Reactions   Lyrica [Pregabalin] Shortness Of Breath, Swelling and Other (See Comments)    chest tight   Hydrocodone Other (See Comments)    hallucinate   Ivp Dye [Iodinated Diagnostic Agents] Hives and Itching        Lipitor [Atorvastatin] Nausea And Vomiting    Oxycodone Hcl Other (See Comments)    Hallucinations    Penicillin G Nausea And Vomiting   Shellfish Allergy Hives and Itching   Clavulanic Acid    Prednisone    Augmentin [Amoxicillin-Pot Clavulanate] Nausea And Vomiting   Codeine Nausea And Vomiting and Other (See Comments)   Cymbalta [Duloxetine Hcl] Nausea Only    GI upset at 59m   Demerol Nausea And Vomiting   Doxycycline Nausea Only and Other (See Comments)    Weakness, sick to her stomach   Effexor [Venlafaxine] Nausea Only   Hydrochlorothiazide Other (See Comments)    Not an allergy but urinary frequency and sweating at 290m  Hydroxyzine Other (See Comments)    Excessive sweating.    Metoprolol Other (See Comments)    Slowed body down, per pt    Protonix [Pantoprazole Sodium] Nausea Only   Trazodone And Nefazodone Other (See Comments)    Sedation     Medications Reviewed Today     Reviewed by HaDebbora DusCMA (Certified Medical Assistant) on 05/10/21 at 10[place]aryvilleist Status: <None>   Medication Order Taking? Sig Documenting Provider Last Dose Status Informant  aspirin 81 MG tablet [id number] Take 1 tablet (81 mg total) by mouth daily. GoMinna MerrittsMD Taking Active   carvedilol (COREG) 6.25 MG tablet [id number] Take 1 tablet (6.25 mg total) by mouth 2 (two) times daily. DuRise MuPA-C Taking Active   Coenzyme Q10 (CO Q10) 100 MG CAPS [id number] Take 100 mg by mouth daily. [provider] Taking Active   ezetimibe (ZETIA) 10 MG tablet [id number] TAKE ONE TABLET EVERY DAY Gollan, TiKathlene NovemberMD Taking Active   fluticasone (FLONASE) 50 MCG/ACT nasal spray [id number] Place 1 spray into both nostrils daily.  Patient not taking: Reported on 05/10/2021   [provider] Not Taking Active   furosemide (LASIX) 20 MG tablet [id number] Take 1 tablet (2023mEvery Other Day alternating with 2 tablets (64m26mvery Other Day WalkLoel Dubonnet Taking Active   gabapentin  (NEURONTIN) 400 MG capsule [id number] TAKE 1 CAPSULE BY MOUTH EVERY 8 HOURS DuncTonia Ghent Taking Active   loratadine (CLARITIN) 10 MG tablet [id number] Take 10 mg by mouth daily. [provider] Taking Active   omeprazole (PRILOSEC) 20 MG capsule  [id number] Yes Take 1 capsule (20 mg total) by mouth daily. Tonia Ghent, MD Taking Active   potassium chloride (KLOR-CON) 10 MEQ tablet [id number] Yes Take 10 mEq by mouth daily. [provider] Taking Active   potassium chloride (KLOR-CON) 10 MEQ tablet [id number] Yes Take 1 tablet (10 mEq total) by mouth daily. Loel Dubonnet, NP Taking Active   rosuvastatin (CRESTOR) 20 MG tablet [id number]  Take 1 tablet (20 mg total) by mouth daily. Rise Mu, PA-C  Active   sertraline (ZOLOFT) 100 MG tablet [id number] Yes TAKE 1 TABLET BY MOUTH ONCE DAILY. Tonia Ghent, MD Taking Active   traMADol Veatrice Bourbon) 50 MG tablet [id number] Yes Take 1 tablet (50 mg total) by mouth in the morning. Tonia Ghent, MD Taking Active   vitamin B-12 (CYANOCOBALAMIN) 1000 MCG tablet [id number] Yes Take 1 tablet (1,000 mcg total) by mouth daily. Tonia Ghent, MD Taking Active             Patient Active Problem List   Diagnosis Date Noted   Acute recurrent frontal sinusitis 04/21/2021   Impacted cerumen of right ear 04/21/2021   History of diabetes mellitus 02/09/2021   Scalp irritation 11/13/2020   Seasonal allergies 07/12/2019   Anemia 02/05/2019   Rash 09/04/2018   GERD (gastroesophageal reflux disease) 09/04/2018   Elevated CK 07/30/2018   Neuropathy 04/24/2018   Health care maintenance 01/19/2018   Medicare annual wellness visit, initial 01/19/2018   Grief 10/07/2017   Sweating abnormality 05/28/2017   Insomnia 05/28/2017   Fatigue 01/15/2017   Dysuria 05/06/2016   Skin lesion 03/16/2016   Aftercare following surgery of the circulatory system 02/18/2016   Spinal stenosis of lumbar region 08/21/2015   Labile blood  pressure 05/17/2015   Leg pain, bilateral 02/08/2015   SOB (shortness of breath) 02/08/2015   Cough 01/17/2015   Acute non-recurrent sinusitis 12/21/2014   Low back pain 11/05/2014   Benign paroxysmal positional vertigo 09/04/2014   Carotid stenosis 07/03/2014   Preoperative cardiovascular examination 03/07/2014   Dysphagia, pharyngoesophageal phase 12/25/2013   Acute on chronic diastolic CHF (congestive heart failure) ([place]) 11/23/2013   Palpitations 11/23/2013   Urticaria 04/05/2013   Screening for colon cancer 05/27/2012   Hyperlipidemia 05/04/2012   Dizziness 04/11/2012   Anxiety and depression 03/10/2012   Carotid artery disease ([place]) 12/22/2011   Other and unspecified hyperlipidemia 10/08/2009   Essential hypertension 10/08/2009   CAD, ARTERY BYPASS GRAFT 10/08/2009   PVD 10/08/2009   SYNCOPE, HX OF 10/08/2009   THROMBOPHLEBITIS, HX OF 10/08/2009    Immunization History  Administered Date(s) Administered   Fluad Quad(high Dose 65+) 05/11/2019, 07/08/2020   Influenza Split 05/27/2012   Influenza Whole 06/20/2013   Influenza, High Dose Seasonal PF 05/27/2018   Influenza, Seasonal, Injecte, Preservative Fre 06/24/2016   Influenza,inj,Quad PF,6+ Mos 06/14/2014, 06/07/2015, 05/24/2017   PFIZER(Purple Top)SARS-COV-2 Vaccination 09/05/2019, 09/26/2019, 06/12/2020   Pneumococcal Conjugate-13 01/07/2017   Pneumococcal Polysaccharide-23 03/11/2011   Tdap 05/27/2012   Zoster, Live 03/11/2011    Conditions to be addressed/monitored:  Hypertension, Hyperlipidemia, Heart Failure, Coronary Artery Disease, GERD, Depression, and Anxiety   Care Plan : [place]  Updates made by Debbora Dus, [place] since 06/16/2021 12:00 AM     Problem: CHL AMB "PATIENT-SPECIFIC PROBLEM"   Onset Date: 06/16/2021  Note:     Current Barriers:  Suboptimal therapeutic regimen for depression - per patient report  Pharmacist Clinical Goal(s):  Patient will contact provider office  for questions/concerns  as evidenced notation of same in electronic health record through collaboration with PharmD and provider.   Interventions: 1:1 collaboration with Tonia Ghent, MD regarding development and update of comprehensive plan of care as evidenced by provider attestation and co-signature Inter-disciplinary care team collaboration (see longitudinal plan of care) Comprehensive medication review performed; medication list updated in electronic medical record  Hypertension (BP goal <140/90) -Not ideally controlled - last 2 clinic readings elevated  -Managed by cardiology  -Current treatment: Carvedilol 6.25 mg - 1 tablet twice daily (increased dose 6/22) -Medications previously tried: amlodipine - stopped 6/22  -Current home readings: today - 8:30 AM - 153/58, 53, repeated at 10 AM - 142/47, 53 -Denies hypotensive/hypertensive symptoms - she states the heart palpitations have improved with carvedilol dose increase  -She feel weak and nauseated after morning medications. -Educated on BP goals and benefits of medications for prevention of heart attack, stroke and kidney damage; -Counseled to monitor BP at home this week, document, and provide log at future appointments -Recommended to continue current medication; Follow up call 1 week for BP log.  Hyperlipidemia/CAD/PAD (LDL goal < 70) -Controlled - LDL within goal  -Current treatment: Zetia 10 mg - 1 tablet daily Rosuvastatin 20 mg - 1 tablet daily  Aspirin 81 mg - 1 tablet daily -Medications previously tried: Repatha (Cost), simvastatin, atorvastatin -Educated on Cholesterol goals;  Benefits of statin for ASCVD risk reduction; -Recommended to continue current medication  Heart Failure (Goal: manage symptoms and prevent exacerbations) -Controlled -Last ejection fraction: Echocardiogram normal ejection fraction, diastolic dysfunction (Date: April 2021) -HF type: Diastolic -NYHA Class: I (no actitivty limitation) -AHA  HF Stage: B (Heart disease present - no symptoms present) -Current treatment: Lasix 20 mg - alternate with 40 mg every other day Potassium chloride 10 mEq - 1 tablet daily  -Medications previously tried: none   -Denies swelling with Lasix  -Educated on Proper diuretic administration and potassium supplementation -Recommended to continue current medication  Depression/Anxiety (Goal: Improve mood) -Uncontrolled -States she feels very depressed. Good days and bad days, low energy, does not want to do anything. -Overwhelmed by grief - loss of her daughter 2020 and multiple family members this July 2022 -Current treatment: Sertraline 100 mg - 1 tablet daily (increase dose 02/2020) -Medications previously tried/failed: venlafaxine  -PHQ9: 0 (05/10/21) -GAD7: n/a -She is not sure she wants to see a counselor at this time, caregiver for husband  -Educated on Benefits of medication for symptom control Benefits of cognitive-behavioral therapy with or without medication -Collaborated with PCP; See telephone note.   GERD (Goal: Improve symptoms) -Controlled -Current treatment  Pepcid - 1 tablet daily at bedtime -Medications previously tried: omeprazole - patient discontinued due to  Pepcid more effective -Recommended to continue current medication  Patient Goals/Self-Care Activities Patient will:  - take medications as prescribed  Follow Up Plan: Telephone follow up appointment with care management team member scheduled for: 1 month    Medication Assistance: None required.  Patient affirms current coverage meets needs.  Compliance/Adherence/Medication fill history: Care Gaps: None   Star-Rating Drugs: Medication:                Last Fill:         Day Supply Rosuvastatin 74m     04/18/21            100  Morning: (spreads out - she takes 2-3 at a time) -Aspirin -Potassium -Furosemide -Carvedilol -Sertraline  -Slow-mag OTC - takes to keep BM regular  -Vitamin  B12 OTC -Gabapentin  (TID)   Bedtime: -Pepcid before bed (chew tab), started a few days ago and feeling some better (she stopped omeprazole) -Rosuvastatin  -Zetia  -CoQ10 - energy levels  PRN: -Zofran - nausea -Tramadol - for back pain   Patient's preferred pharmacy is: [place], [place] - [address] [place] [address] Phone: [phone] Fax: [phone]  Care Plan and Follow Up Patient Decision:  Patient agrees to Care Plan and Follow-up.  Debbora Dus, PharmD Clinical Pharmacist [place] [facility] at [facility] [phone]

## 2021-06-24 ENCOUNTER — Telehealth: Payer: Self-pay

## 2021-06-24 NOTE — Chronic Care Management (AMB) (Addendum)
Chronic Care Management Pharmacy Assistant   Name: Catherine Green  MRN: [id number] DOB: [birth year]  Reason for Encounter: Hypertension Disease State   Recent office visits:  None since last CCM contact  Recent consult visits:  None since last CCM contact  Hospital visits:  None in previous 6 months  Medications: Outpatient Encounter Medications as of 06/24/2021  Medication Sig   aspirin 81 MG tablet Take 1 tablet (81 mg total) by mouth daily.   carvedilol (COREG) 6.25 MG tablet Take 1 tablet (6.25 mg total) by mouth 2 (two) times daily.   Coenzyme Q10 (CO Q10) 100 MG CAPS Take 100 mg by mouth daily.   ezetimibe (ZETIA) 10 MG tablet TAKE ONE TABLET EVERY DAY   furosemide (LASIX) 20 MG tablet Take 1 tablet (20mg ) Every Other Day alternating with 2 tablets (40mg ) Every Other Day   gabapentin (NEURONTIN) 400 MG capsule TAKE 1 CAPSULE BY MOUTH EVERY 8 HOURS   omeprazole (PRILOSEC) 20 MG capsule Take 1 capsule (20 mg total) by mouth daily. (Patient not taking: Reported on 06/16/2021)   potassium chloride (KLOR-CON) 10 MEQ tablet Take 10 mEq by mouth daily.   potassium chloride (KLOR-CON) 10 MEQ tablet Take 1 tablet (10 mEq total) by mouth daily.   rosuvastatin (CRESTOR) 20 MG tablet Take 1 tablet (20 mg total) by mouth daily.   sertraline (ZOLOFT) 100 MG tablet Take 1.5 tablets (150 mg total) by mouth daily.   traMADol (ULTRAM) 50 MG tablet Take 1 tablet (50 mg total) by mouth in the morning.   vitamin B-12 (CYANOCOBALAMIN) 1000 MCG tablet Take 1 tablet (1,000 mcg total) by mouth daily.   No facility-administered encounter medications on file as of 06/24/2021.    Recent Office Vitals: BP Readings from Last 3 Encounters:  05/10/21 (!) 145/61  04/21/21 (!) 166/58  02/26/21 (!) 122/50   Pulse Readings from Last 3 Encounters:  05/10/21 (!) 57  04/21/21 62  02/26/21 60    Wt Readings from Last 3 Encounters:  04/21/21 144 lb (65.3 kg)  04/01/21 139 lb (63 kg)  02/26/21 145  lb 2 oz (65.8 kg)     Kidney Function Lab Results  Component Value Date/Time   CREATININE 0.79 04/14/2021 11:29 AM   CREATININE 0.85 01/21/2021 10:32 AM   CREATININE 0.81 08/04/2017 04:49 PM   GFR 70.32 04/14/2021 11:29 AM   GFRNONAA 63 07/29/2020 11:13 AM   GFRAA 73 07/29/2020 11:13 AM    BMP Latest Ref Rng & Units 04/14/2021 01/21/2021 01/14/2021  Glucose 70 - 99 mg/dL 98 94 90  BUN 6 - 23 mg/dL 17 13 18   Creatinine 0.40 - 1.20 mg/dL 0.79 0.85 1.16(H)  BUN/Creat Ratio 12 - 28 - 15 16  Sodium 135 - 145 mEq/L 141 140 142  Potassium 3.5 - 5.1 mEq/L 3.5 4.1 4.0  Chloride 96 - 112 mEq/L 101 97 101  CO2 19 - 32 mEq/L 31 27 25   Calcium 8.4 - 10.5 mg/dL 9.0 9.5 9.4     Contacted patient on 06/26/21 to discuss hypertension disease state  Current antihypertensive regimen:   Carvedilol 6.25 mg - take 1 tablet 2 times daily    Patient verbally confirms she is taking the above medications as directed. Yes  How often are you checking your Blood Pressure? daily  she checks her blood pressure in the morning   Current home BP readings:              BP  PULSE  139/53  61  140/46  57  156/59  56  Wrist or arm cuff: arm cuff Caffeine intake: 1 cup regular coffee am  Salt intake:limits with adding to meals  OTC medications including pseudoephedrine or NSAIDs? Low dose aspirin 1 a day   Any readings above 180/120? No  What recent interventions/DTPs have been made by any provider to improve Blood Pressure control since last CPP Visit:  Instructed to keep a BP log for 1 week  Any recent hospitalizations or ED visits since last visit with CPP? No  What diet changes have been made to improve Blood Pressure Control?  Limits salt intake   What exercise is being done to improve your Blood Pressure Control?  Patient reports limited exercise.  Adherence Review: Is the patient currently on ACE/ARB medication? No Does the patient have >5 day gap between last estimated fill  dates? No   Star Rating Drugs:  Medication:  Last Fill: Day Supply Rosuvastatin 20mg      04/18/21            100  Care Gaps: Annual wellness visit in last year? Yes Most Recent BP reading: 145/61  57-P 05/10/21  CCM appointment on 07/16/21  Debbora Dus, CPP notified  Avel Sensor, [place] Assistant [phone]  I have reviewed the care management and care coordination activities outlined in this encounter and I am certifying that I agree with the content of this note. No further action required.  Debbora Dus, PharmD Clinical Pharmacist [facility] [phone]

## 2021-06-30 DIAGNOSIS — F32A Depression, unspecified: Secondary | ICD-10-CM

## 2021-06-30 DIAGNOSIS — I10 Essential (primary) hypertension: Secondary | ICD-10-CM | POA: Diagnosis not present

## 2021-06-30 DIAGNOSIS — F419 Anxiety disorder, unspecified: Secondary | ICD-10-CM | POA: Diagnosis not present

## 2021-06-30 DIAGNOSIS — R69 Illness, unspecified: Secondary | ICD-10-CM | POA: Diagnosis not present

## 2021-06-30 DIAGNOSIS — E782 Mixed hyperlipidemia: Secondary | ICD-10-CM

## 2021-07-16 ENCOUNTER — Telehealth: Payer: Medicare HMO

## 2021-07-16 ENCOUNTER — Other Ambulatory Visit: Payer: Self-pay

## 2021-07-16 DIAGNOSIS — I6523 Occlusion and stenosis of bilateral carotid arteries: Secondary | ICD-10-CM

## 2021-08-01 IMAGING — DX DG ABDOMEN 1V
2 series · 2 of 2 positions shown · non-contrast
Comparison: 03/16/2013

CLINICAL DATA: Left flank pain

EXAM:
ABDOMEN - 1 VIEW

[abdomen kub (1 of 2)]
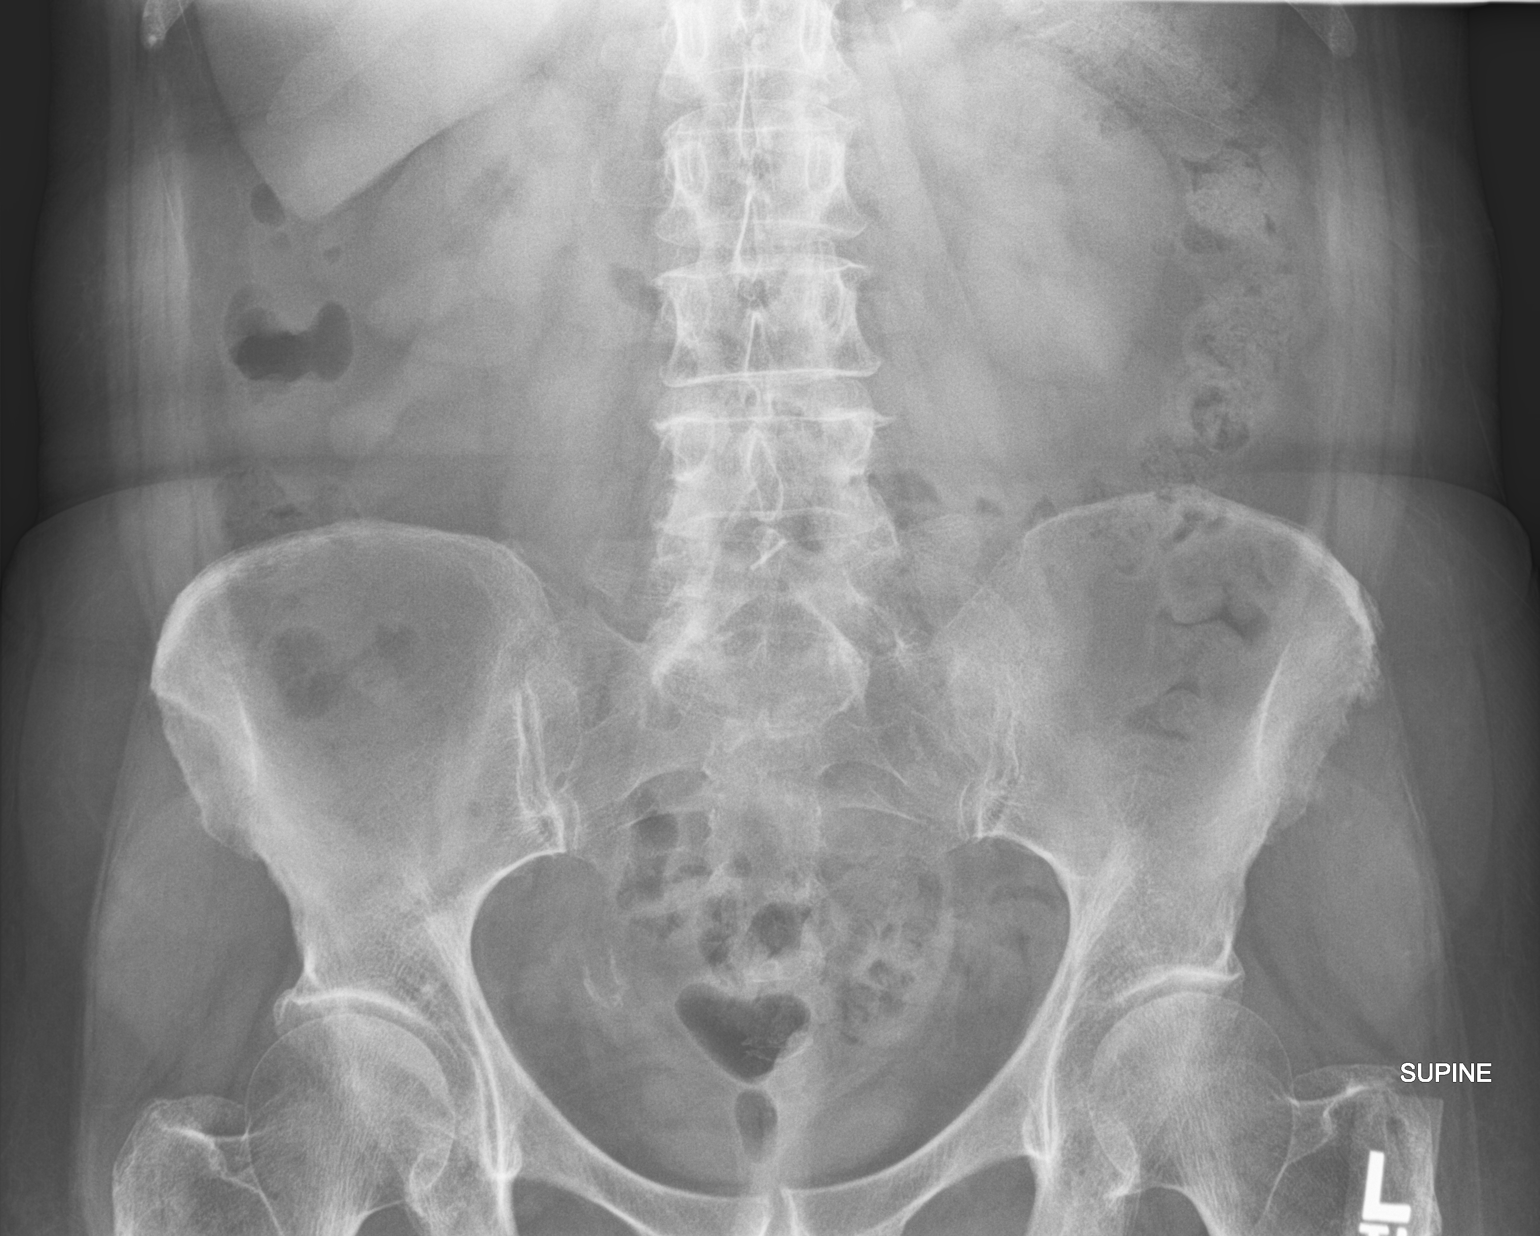

[abdomen kub (2 of 2)]
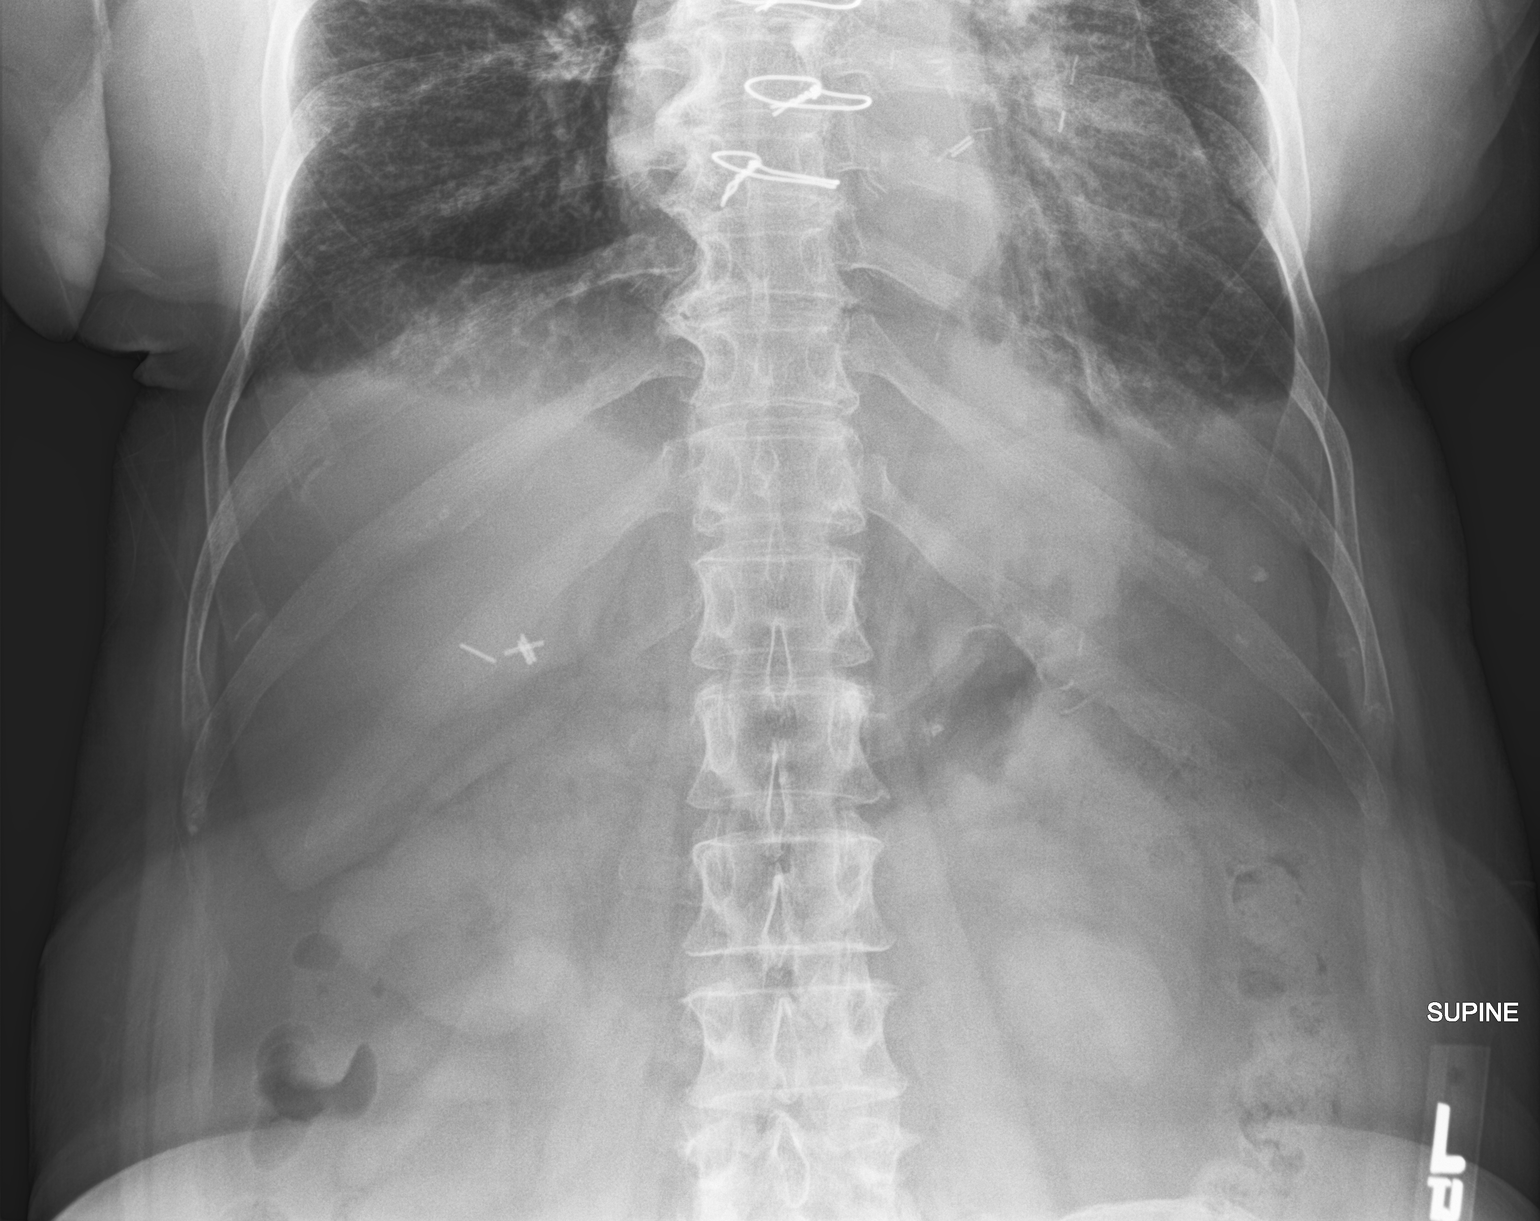

[2 of 2 positions shown; findings below may reference images not displayed]

FINDINGS: The bowel gas pattern is normal. No radio-opaque calculi or other
significant radiographic abnormality are seen. Cholecystectomy
clips.
IMPRESSION: Negative.  No nephrolithiasis evident.

## 2021-08-06 NOTE — Progress Notes (Signed)
Cardiology Office Note    Date:  08/08/2021   ID:  Catherine Green, DOB May 14, 1941, MRN [id number]  PCP:  Tonia Ghent, MD  Cardiologist:  Ida Rogue, MD  Electrophysiologist:  None   Chief Complaint: Shortness of breath  History of Present Illness:   Catherine Green is a 80 y.o. female with history of CAD status post three-vessel CABG in 2006, HFpEF, bilateral carotid artery disease status post bilateral CEA followed by vascular surgery, HTN, HLD, frequent PVCs, atrial tachycardia, multiple medication intolerances, back pain status post surgery, prior tobacco use starting at age 84 and quitting at age 2 who presents for evaluation of shortness of breath.   Most recent cardiac cath from 09/2007 demonstrated an atretic LIMA to LAD with patent vein grafts.   She was seen at her PCPs office in 10/2019 with dyspnea.  BNP mildly elevated at 300.  Her symptoms were felt to be likely multifactorial including volume overload and mild longstanding anemia.  She was started on Lasix 40 mg daily.  Echo in 11/2019 showed a hyperdynamic LVSF with an EF of 65 to 82%, grade 3 diastolic dysfunction, normal RV systolic function and RV cavity size, and trivial mitral regurgitation.  Unable to estimate right heart pressures, IVC not documented as being dilated.  Small pleural effusion was noted.  Following this echo she was taking Lasix 40 mg twice daily with continued swelling.  Recheck at her PCPs office in late 12/2019 demonstrated her weight was down 6 pounds, however she continued to note bilateral pedal edema and abdominal bloating as the day progressed.  She continued to have some occasional dyspnea in the afternoons.  Her appetite remained down.  She was seen in the office on 02/06/2020 noting increased dyspnea, PND, 2-3 pillow orthopnea, and increased fatigue.  BP was elevated in the 423N to 361W systolic.  Her weight was down 8 pounds in the past 3 months with notes indicating she was missing meals.  She  reported nausea when eating with associated dysphagia.  Repeat BNP mildly improved at 239 with stable renal function.  She was taking Lasix 40 mg daily.  It was felt there was some component of venous insufficiency with her lower extremity swelling.  It was recommended she increase her Lasix to 40 mg twice daily for 1 week followed by 40 mg daily thereafter.  She was seen in 02/2020 noting improvement in her dyspnea following diuresis and had continued to take Lasix 40 mg twice daily since her visit in 01/2020.  Her weight is down another 6 pounds when compared to her last clinic visit.  She did note some positional dizziness and generalized fatigue.  She was orthostatic in the office that day.  Lasix was decreased to 40 mg daily.  Symptoms felt similar to her presentation leading up to her bypass.  In this setting, she underwent Lexiscan MPI on 03/15/2020 which showed no significant ischemia, normal wall motion, EF 76%, attenuation corrected images with aortic atherosclerosis and coronary artery calcifications, and was overall a low risk scan.  EKG showed PVCs.  CTA of the neck from 04/23/2020 showed no dissection or pseudoaneurysm without interval growth of remodeling/fusiform aneurysm within the right internal carotid artery, 30% left ICA stenosis, and 70% stenosis of the brachiocephalic origin.  There was 40% subclavian artery stenosis.  She was seen in the office on 04/29/2020 noting her dizziness persisted.  Her weight was stable.  She had further decreased her Lasix to 20 mg daily  in the setting of ongoing dizziness.  She denied any shortness of breath or lower extremity swelling.  Orthostatic vital signs remained positive at that visit.  Imdur was discontinued and carvedilol was decreased to 3.125 mg twice daily.  Following this, she contacted our office in 05/2020, noting continued orthostasis, and in this setting carvedilol was held.  She was seen in the office on 05/29/2020, and continued to note positional  dizziness.  She was no longer on carvedilol or Imdur though was taking Lasix 40 mg daily.  Home BP readings were predominantly in the 932T to 557D systolic.  Her dizziness was noted to date back at least to 02/2018.  She declined head CT.  She was advised to follow-up with her PCP.  It was again recommended her Lasix be decreased to 20 mg daily.  She was hypertensive when lying for orthostatic BP with orthostatics continuing to be mildly positive.  Renal artery ultrasound showed no evidence of renal artery stenosis.  She was seen in the office in 07/2020 noting some improvement in her chronic dizziness.  BP readings ranged from the 220U to 542 systolic with most heart rates in the 60s bpm. Carotid artery ultrasound in 07/2020, showed 1-39% bilateral ICA stenosis, antegrade flow along the vertebral arteries bilaterally, normal flow hemodynamics in the bilateral subclavian arteries. Bilateral ABI's were normal in 10/2020.    She was seen in 10/2020 and was doing well from a cardiac perspective.  She did continue to note a significant improvement in her chronic dizziness when having permissive BP.  Home BP readings were in the 150s to 160s for the most part with occasional readings in the 706C systolic.  She was frequently taking prn Coreg, and in this setting, it was recommended she take Coreg 3.125 mg bid.    She was seen on 01/08/2021 noting a several week history of elevated BP readings, particularly in the morning with many readings in the 160s to 376E mmHg systolic.  ReDs vest was elevated at 39%.  It was recommended she start amlodipine 5 mg nightly and continue carvedilol 3.125 mg twice daily.  Her Lasix was titrated to 40 mg daily for 3 days.  Labs obtained at that time showed a BNP of 163.  She underwent echo on 01/10/2021 which showed an EF of 60 to 65%, no regional wall motion abnormalities, indeterminate LV diastolic function parameters, normal RV systolic function and ventricular cavity size, and moderate  mitral regurgitation.  She was seen in the office on 01/14/2021 with stable weight and BP of 170/64 in the office.  She continued to note unchanged longstanding lightheadedness and dizziness.  She remained very anxious.  Her shortness of breath did improve some following increased diuresis.  She did note some palpitations.  Given prior noted orthostatic hypotension permissive hypertension was recommended and she was continued on carvedilol and amlodipine.  Lasix was adjusted to 40 mg alternating with 20 mg every other day.  She underwent Zio patch which showed a predominant rhythm of sinus with an average heart rate of 62 bpm (range 48 to 135 bpm), 3 runs of SVT/atrial tachycardia were noted with the fastest interval lasting 9 beats with a maximal rate of 135 bpm and the longest interval lasting 14 seconds with an average rate of 102 bpm.  There were frequent PVCs representing a 13.4% burden.  No patient triggered events were observed.  Given this, it was recommended amlodipine be discontinued and carvedilol be titrated to 6.25 mg twice daily to  improve with PVC burden.  She was last seen in the office in 01/2021, by her primary cardiologist, and noted rare dizziness along with increased stress.  Weight was stable.  No changes were made.  She comes in today and is quite upset surrounding the loss of her sister, daughter, and granddaughter throughout the month of July, 2022.  With this, she has noted intermittent episodes of shortness of breath.  No chest pain, palpitations, dizziness, presyncope, or syncope.  These episodes of shortness of breath occur randomly, though also are exacerbated when she thinks about her recent losses.  With the above, she has not had much of an appetite.  Her weight is down 5 pounds today when compared to her last in person clinic visit.  She reports being initiated on Zoloft by her PCP, though has not noted an improvement in her grieving with this.  No lower extremity swelling,  abdominal distention, or orthopnea.  She indicates she will lay down for sleep and fall asleep.  Approximately 2 hours later she will wake up short of breath and have to sit up there after.  Following the titration of carvedilol, she noted resolution of her palpitations.  She remains on furosemide 20 mg alternating with 40 mg every other day and feels like she is euvolemic.  No fever or chills.  No falls, hematochezia, melena, hemoptysis, hematemesis, or hematuria.   Labs independently reviewed: 03/2021 - Hgb 11.7, PLT 303, potassium 3.5, BUN 17, serum creatinine 0.79, albumin 4.3, AST/ALT normal 01/2021 - TC 110, TG 80, HDL 66, LDL 28, A1c 5.8, BNP 137 07/2020 - TSH normal  Past Medical History:  Diagnosis Date   Allergy    hay fever   Anemia    Anxiety    Back pain    Carotid artery occlusion    Cerebrovascular disease    extracranial; occlusive   Chicken pox    Coronary artery disease    Depression    Dizziness    Dizziness    DVT (deep venous thrombosis) (HCC)    Fainting spell    GERD (gastroesophageal reflux disease)    Headache    Heart murmur    Hyperlipidemia    Hypertension    Leg pain    PONV (postoperative nausea and vomiting)    severe nausea and vomiting   Pre-syncope    PVD (peripheral vascular disease) ([facility])    endarterectomy by [name]   Seasonal allergies    Shortness of breath dyspnea    wth ambulation at times   Swelling of both ankles    and abdomen; takes Lasix when needed   Thrombophlebitis    following childbirth   Ulcer     Past Surgical History:  Procedure Laterality Date   APPENDECTOMY     BACK SURGERY  01/14/16   BREAST SURGERY     saline implants   CARDIAC CATHETERIZATION     CAROTID ENDARTERECTOMY  1992   CAROTID ENDARTERECTOMY Right February 13, 2005   Re-do Right CE   CATARACT EXTRACTION, BILATERAL  2013   CHOLECYSTECTOMY     CHOLECYSTECTOMY, LAPAROSCOPIC  04/02/14   Dr. Rochel Brome   CORONARY ARTERY BYPASS GRAFT  2006   x3 Dr.  Prescott Gum   ENDARTERECTOMY Left 07/20/2014   Procedure: ENDARTERECTOMY CAROTID-LEFT;  Surgeon: Rosetta Posner, MD;  Location: [facility];  Service: Vascular;  Laterality: Left;   EYE SURGERY Bilateral Feb. 2013   Cataract Left eye   SEPTOPLASTY  with bilateral inferior turbinate reductions   SPINE SURGERY  12/2015   TOTAL ABDOMINAL HYSTERECTOMY     UPPER GASTROINTESTINAL ENDOSCOPY     had polyps removed from esophagus   UPPER GI ENDOSCOPY      Current Medications: Current Meds  Medication Sig   aspirin 81 MG tablet Take 1 tablet (81 mg total) by mouth daily.   carvedilol (COREG) 6.25 MG tablet Take 1 tablet (6.25 mg total) by mouth 2 (two) times daily.   Coenzyme Q10 (CO Q10) 100 MG CAPS Take 100 mg by mouth daily.   ezetimibe (ZETIA) 10 MG tablet TAKE ONE TABLET EVERY DAY   furosemide (LASIX) 20 MG tablet Take 1 tablet (20mg ) Every Other Day alternating with 2 tablets (40mg ) Every Other Day   gabapentin (NEURONTIN) 400 MG capsule TAKE 1 CAPSULE BY MOUTH EVERY 8 HOURS   omeprazole (PRILOSEC) 20 MG capsule Take 1 capsule (20 mg total) by mouth daily.   potassium chloride (KLOR-CON) 10 MEQ tablet Take 10 mEq by mouth daily.   rosuvastatin (CRESTOR) 20 MG tablet Take 1 tablet (20 mg total) by mouth daily.   sertraline (ZOLOFT) 100 MG tablet Take 1.5 tablets (150 mg total) by mouth daily.   traMADol (ULTRAM) 50 MG tablet Take 1 tablet (50 mg total) by mouth in the morning.   vitamin B-12 (CYANOCOBALAMIN) 1000 MCG tablet Take 1 tablet (1,000 mcg total) by mouth daily.    Allergies:   Lyrica [pregabalin], Hydrocodone, Ivp dye [iodinated diagnostic agents], Lipitor [atorvastatin], Oxycodone hcl, Penicillin g, Shellfish allergy, Clavulanic acid, Prednisone, Augmentin [amoxicillin-pot clavulanate], Codeine, Cymbalta [duloxetine hcl], Demerol, Doxycycline, Effexor [venlafaxine], Hydrochlorothiazide, Hydroxyzine, Metoprolol, Protonix [pantoprazole sodium], and Trazodone and nefazodone   Social  History   Socioeconomic History   Marital status: Married    Spouse name: Jeneen Rinks    Number of children: 1   Years of education: Not on file   Highest education level: 11th grade  Occupational History    Comment: Retired   Tobacco Use   Smoking status: Former    Packs/day: 0.30    Years: 15.00    Pack years: 4.50    Types: Cigarettes    Quit date: 08/31/1996    Years since quitting: 24.9   Smokeless tobacco: Never  Vaping Use   Vaping Use: Never used  Substance and Sexual Activity   Alcohol use: No    Alcohol/week: 0.0 standard drinks   Drug use: No   Sexual activity: Not Currently  Other Topics Concern   Not on file  Social History Narrative   Lives with husband   Work - retired Emergency planning/management officer   Diet - healthy   Right handed    Caffeine- 1 cup per day.   Social Determinants of Health   Financial Resource Strain: Low Risk    Difficulty of Paying Living Expenses: Not very hard  Food Insecurity: Not on file  Transportation Needs: Not on file  Physical Activity: Not on file  Stress: Not on file  Social Connections: Not on file     Family History:  The patient's family history includes Aneurysm in her brother and mother; Birth defects in her brother; Bleeding Disorder in her mother and sister; Cancer in her sister; Cirrhosis in her father; Deep vein thrombosis in her brother; Heart attack in her father; Heart disease in her brother, father, mother, and sister; Hyperlipidemia in her brother, daughter, father, mother, and sister; Hypertension in her brother, daughter, father, mother, sister, and another family member; Peripheral  vascular disease in her daughter; Varicose Veins in her mother and sister. There is no history of Breast cancer or Colon cancer.  ROS:   Review of Systems  Constitutional:  Positive for malaise/fatigue. Negative for chills, diaphoresis, fever and weight loss.  HENT:  Negative for congestion.   Eyes:  Negative for discharge and redness.  Respiratory:   Positive for shortness of breath. Negative for cough, hemoptysis, sputum production and wheezing.   Cardiovascular:  Negative for chest pain, palpitations, orthopnea, claudication, leg swelling and PND.  Gastrointestinal:  Negative for abdominal pain, blood in stool, heartburn, melena, nausea and vomiting.  Genitourinary:  Negative for hematuria.  Musculoskeletal:  Negative for falls and myalgias.  Skin:  Negative for rash.  Neurological:  Positive for weakness. Negative for dizziness, tingling, tremors, sensory change, speech change, focal weakness and loss of consciousness.  Endo/Heme/Allergies:  Does not bruise/bleed easily.  Psychiatric/Behavioral:  Positive for depression. Negative for substance abuse and suicidal ideas. The patient is not nervous/anxious.        Grieving  All other systems reviewed and are negative.   EKGs/Labs/Other Studies Reviewed:    Studies reviewed were summarized above. The additional studies were reviewed today:  2D echo 11/2019: 1. Left ventricular ejection fraction, by estimation, is 65 to 70%. The  left ventricle has normal function. Left ventricular endocardial border  not optimally defined to evaluate regional wall motion. Left ventricular  diastolic parameters are consistent  with Grade III diastolic dysfunction (restrictive). Elevated left atrial  pressure.   2. Right ventricular systolic function is normal. The right ventricular  size is normal. Tricuspid regurgitation signal is inadequate for assessing  PA pressure.   3. The mitral valve is grossly normal. Trivial mitral valve  regurgitation.   4. The aortic valve was not well visualized. Aortic valve regurgitation  is not visualized. No aortic stenosis is present.   5. The inferior vena cava is normal in size with greater than 50%  respiratory variability, suggesting right atrial pressure of 3 mmHg. __________   Carlton Adam MPI 02/2020: Pharmacological myocardial perfusion imaging study with no  significant  ischemia Normal wall motion, EF estimated at 76% No EKG changes concerning for ischemia at peak stress or in recovery. Resting EKG with PVCs Attenuation correction images with aortic atherosclerosis and coronary calcification Low risk scan ___________   2D echo 01/10/2021:  1. Left ventricular ejection fraction, by estimation, is 60 to 65%. The  left ventricle has normal function. The left ventricle has no regional  wall motion abnormalities. Left ventricular diastolic parameters are  indeterminate.   2. Right ventricular systolic function is normal. The right ventricular  size is normal. Tricuspid regurgitation signal is inadequate for assessing  PA pressure.   3. The mitral valve was not well visualized. Moderate mitral valve  regurgitation.  __________   Elwyn Reach patch 12/2020: Predominant underlying rhythm was Sinus Rhythm.  Patient had a min HR of 48 bpm, max HR of 135 bpm, and avg HR of 62 bpm.   3 Supraventricular Tachycardia/atrial tachycardia runs occurred, the run with the fastest interval lasting 9 beats with a max rate of 135 bpm, the  longest lasting 14.0 secs with an avg rate of 102 bpm.     Isolated SVEs were rare (<1.0%), SVE Couplets were rare (<1.0%), and SVE Triplets were rare  (<1.0%). Isolated VEs were frequent (13.4%, 115534), VE Couplets were rare (<1.0%, 84), and VE Triplets were rare (<1.0%, 1).  Ventricular Bigeminy and Trigeminy were present.  No patient triggered events noted __________     EKG:  EKG is ordered today.  The EKG ordered today demonstrates NSR, 63 bpm, nonspecific ST-T changes  Recent Labs: 01/14/2021: BNP 137.4 04/14/2021: ALT 11 08/08/2021: BUN 18; Creatinine, Ser 0.76; Hemoglobin 11.7; Platelets 314; Potassium 4.3; Sodium 138  Recent Lipid Panel    Component Value Date/Time   CHOL 110 02/26/2021 1124   TRIG 80 02/26/2021 1124   HDL 66 02/26/2021 1124   CHOLHDL 1.7 02/26/2021 1124   CHOLHDL 2 02/01/2019 0907   VLDL  21.8 02/01/2019 0907   LDLCALC 28 02/26/2021 1124   LDLDIRECT 139.1 04/11/2012 1131    PHYSICAL EXAM:    VS:  BP 140/64 (BP Location: Left Arm, Patient Position: Sitting, Cuff Size: Normal)   Pulse 63   Ht 5\' 2"  (1.575 m)   Wt 140 lb (63.5 kg)   SpO2 93%   BMI 25.61 kg/m   BMI: Body mass index is 25.61 kg/m.  Physical Exam Vitals reviewed.  Constitutional:      Appearance: She is well-developed.  HENT:     Head: Normocephalic and atraumatic.  Eyes:     General:        Right eye: No discharge.        Left eye: No discharge.  Neck:     Vascular: No JVD.  Cardiovascular:     Rate and Rhythm: Normal rate and regular rhythm.     Pulses:          Carotid pulses are  on the right side with bruit and  on the left side with bruit.      Posterior tibial pulses are 2+ on the right side and 2+ on the left side.     Heart sounds: S1 normal and S2 normal. Heart sounds not distant. No midsystolic click and no opening snap. Murmur heard.  High-pitched blowing holosystolic murmur is present with a grade of 2/6 at the apex.    No friction rub.  Pulmonary:     Effort: Pulmonary effort is normal. No respiratory distress.     Breath sounds: Normal breath sounds. No decreased breath sounds, wheezing or rales.  Chest:     Chest wall: No tenderness.  Abdominal:     General: There is no distension.     Palpations: Abdomen is soft.     Tenderness: There is no abdominal tenderness.  Musculoskeletal:     Cervical back: Normal range of motion.     Right lower leg: No edema.     Left lower leg: No edema.  Skin:    General: Skin is warm and dry.     Nails: There is no clubbing.  Neurological:     Mental Status: She is alert and oriented to person, place, and time.  Psychiatric:        Speech: Speech normal.        Behavior: Behavior normal.        Thought Content: Thought content normal.        Judgment: Judgment normal.    Wt Readings from Last 3 Encounters:  08/08/21 140 lb (63.5 kg)   04/21/21 144 lb (65.3 kg)  04/01/21 139 lb (63 kg)     ASSESSMENT & PLAN:   Dyspnea: She reports a 70-month history of dyspnea which seems to correlate with the passing of her sister, daughter, and granddaughter in 02/2021.  Symptoms are exacerbated with grief.  No symptoms of chest pain.  Check echo to  evaluate for Takotsubo cardiomyopathy and to trend her mitral regurgitation given this trended from a trivial to moderate when comparing echoes dated 12/19/2019 to 01/17/2021.  Further recommendations regarding imaging and possible ischemic evaluation will be based on echo findings.  Check BMP and CBC.  HFpEF: She appears euvolemic and well compensated.  Her weight is down 5 pounds today when compared to her last clinic visit in 01/2021.  She remains on furosemide 20 mg alternating with 40 mg every other day.  Obtain echo given intermittent dyspnea as outlined above.  HTN with history of orthostatic hypotension: Blood pressure is reasonably controlled in the office today.  We will continue to allow for a degree of permissive hypertension given prior issues with orthostasis.  She remains on carvedilol and furosemide.  CAD status post CABG without angina: No symptoms concerning for chest pain.  Continue aggressive risk factor modification and secondary prevention including aspirin, carvedilol, and rosuvastatin.  Frequent PVCs/atrial tachycardia: Much improved following titration of carvedilol to 6.25 mg twice daily, which will be continued.  Mitral regurgitation: Moderate by echo in 12/2020, previously trivial by echo in 11/2019.  Update echo.  HLD: LDL 28 in 01/2021.  She remains on rosuvastatin.  Carotid artery disease: Status post bilateral CEA.  Followed by vascular surgery.  Most recent carotid artery ultrasound from 07/2020 showed bilateral 1 to 39% ICA stenosis.  She is scheduled for carotid artery ultrasound later this month.  Chronic dizziness: Historically, this has been a longstanding issue,  though she reports this has been better as of late.  Prior cardiac work-up has been unrevealing.  Previously declined head CT.  Grief reaction: Follow-up with PCP.   Disposition: F/u with Dr. Rockey Situ or an APP after echo.   Medication Adjustments/Labs and Tests Ordered: Current medicines are reviewed at length with the patient today.  Concerns regarding medicines are outlined above. Medication changes, Labs and Tests ordered today are summarized above and listed in the Patient Instructions accessible in Encounters.   Signed, Christell Faith, PA-C 08/08/2021 4:07 PM     [address] [place], [place] [id number] [phone]

## 2021-08-08 ENCOUNTER — Ambulatory Visit: Payer: Medicare HMO | Admitting: Physician Assistant

## 2021-08-08 ENCOUNTER — Encounter: Payer: Self-pay | Admitting: Physician Assistant

## 2021-08-08 ENCOUNTER — Other Ambulatory Visit: Payer: Self-pay

## 2021-08-08 ENCOUNTER — Other Ambulatory Visit
Admission: RE | Admit: 2021-08-08 | Discharge: 2021-08-08 | Disposition: A | Discharge status: Home / Self Care | Payer: Medicare HMO | Attending: Physician Assistant | Admitting: Physician Assistant

## 2021-08-08 VITALS — BP 140/64 | HR 63 | Ht 62.0 in | Wt 140.0 lb

## 2021-08-08 DIAGNOSIS — Z951 Presence of aortocoronary bypass graft: Secondary | ICD-10-CM

## 2021-08-08 DIAGNOSIS — R0602 Shortness of breath: Secondary | ICD-10-CM

## 2021-08-08 DIAGNOSIS — I951 Orthostatic hypotension: Secondary | ICD-10-CM | POA: Diagnosis not present

## 2021-08-08 DIAGNOSIS — I10 Essential (primary) hypertension: Secondary | ICD-10-CM

## 2021-08-08 DIAGNOSIS — I2510 Atherosclerotic heart disease of native coronary artery without angina pectoris: Secondary | ICD-10-CM | POA: Diagnosis not present

## 2021-08-08 DIAGNOSIS — I340 Nonrheumatic mitral (valve) insufficiency: Secondary | ICD-10-CM

## 2021-08-08 DIAGNOSIS — I471 Supraventricular tachycardia: Secondary | ICD-10-CM | POA: Diagnosis not present

## 2021-08-08 DIAGNOSIS — I5032 Chronic diastolic (congestive) heart failure: Secondary | ICD-10-CM

## 2021-08-08 DIAGNOSIS — R42 Dizziness and giddiness: Secondary | ICD-10-CM

## 2021-08-08 DIAGNOSIS — I493 Ventricular premature depolarization: Secondary | ICD-10-CM | POA: Diagnosis not present

## 2021-08-08 DIAGNOSIS — F4321 Adjustment disorder with depressed mood: Secondary | ICD-10-CM

## 2021-08-08 LAB — CBC WITH DIFFERENTIAL/PLATELET
Abs Immature Granulocytes: 0.02 10*3/uL (ref 0.00–0.07)
Basophils Absolute: 0 10*3/uL (ref 0.0–0.1)
Basophils Relative: 0 %
Eosinophils Absolute: 0.1 10*3/uL (ref 0.0–0.5)
Eosinophils Relative: 1 %
HCT: 37.4 % (ref 36.0–46.0)
Hemoglobin: 11.7 g/dL — ABNORMAL LOW (ref 12.0–15.0)
Immature Granulocytes: 0 %
Lymphocytes Relative: 18 %
Lymphs Abs: 1.3 10*3/uL (ref 0.7–4.0)
MCH: 26.1 pg (ref 26.0–34.0)
MCHC: 31.3 g/dL (ref 30.0–36.0)
MCV: 83.3 fL (ref 80.0–100.0)
Monocytes Absolute: 0.5 10*3/uL (ref 0.1–1.0)
Monocytes Relative: 7 %
Neutro Abs: 5.1 10*3/uL (ref 1.7–7.7)
Neutrophils Relative %: 74 %
Platelets: 314 10*3/uL (ref 150–400)
RBC: 4.49 MIL/uL (ref 3.87–5.11)
RDW: 14 % (ref 11.5–15.5)
WBC: 6.9 10*3/uL (ref 4.0–10.5)
nRBC: 0 % (ref 0.0–0.2)

## 2021-08-08 LAB — BASIC METABOLIC PANEL
Anion gap: 6 (ref 5–15)
BUN: 18 mg/dL (ref 8–23)
CO2: 29 mmol/L (ref 22–32)
Calcium: 9.3 mg/dL (ref 8.9–10.3)
Chloride: 103 mmol/L (ref 98–111)
Creatinine, Ser: 0.76 mg/dL (ref 0.44–1.00)
GFR, Estimated: >60 mL/min (ref >60)
Glucose, Bld: 107 mg/dL — ABNORMAL HIGH (ref 70–99)
Potassium: 4.3 mmol/L (ref 3.5–5.1)
Sodium: 138 mmol/L (ref 135–145)

## 2021-08-08 NOTE — Patient Instructions (Addendum)
Medication Instructions:  - Your physician recommends that you continue on your current medications as directed. Please refer to the Current Medication list given to you today.  *If you need a refill on your cardiac medications before your next appointment, please call your pharmacy*   Lab Work: - Your physician recommends that you have lab work today: BMP/ [place] Entrance at [facility] 1st desk on the right to check in (REGISTRATION) Lab hours: Monday- Friday (7:30 am- 5:30 pm)    If you have labs (blood work) drawn today and your tests are completely normal, you will receive your results only by: MyChart Message (if you have MyChart) OR A paper copy in the mail If you have any lab test that is abnormal or we need to change your treatment, we will call you to review the results.   Testing/Procedures: - Your physician has requested that you have an echocardiogram. Echocardiography is a painless test that uses sound waves to create images of your heart. It provides your doctor with information about the size and shape of your heart and how well your heart's chambers and valves are working. This procedure takes approximately one hour. There are no restrictions for this procedure. There is a possibility that an IV may need to be started during your test to inject an image enhancing agent. This is done to obtain more optimal pictures of your heart. Therefore we ask that you do at least drink some water prior to coming in to hydrate your veins.      Follow-Up: At [facility], you and your health needs are our priority.  As part of our continuing mission to provide you with exceptional heart care, we have created designated Provider Care Teams.  These Care Teams include your primary Cardiologist (physician) and Advanced Practice Providers (APPs -  Physician Assistants and Nurse Practitioners) who all work together to provide you with the care you need, when you need it.  We recommend  signing up for the patient portal called "MyChart".  Sign up information is provided on this After Visit Summary.  MyChart is used to connect with patients for Virtual Visits (Telemedicine).  Patients are able to view lab/test results, encounter notes, upcoming appointments, etc.  Non-urgent messages can be sent to your provider as well.   To learn more about what you can do with MyChart, go to [email or web address].    Your next appointment:   After the echo is completed   The format for your next appointment:   In Person  Provider:   You may see Ida Rogue, MD or one of the following Advanced Practice Providers Christell Faith, PA-C    Other Instructions  Echocardiogram An echocardiogram is a test that uses sound waves (ultrasound) to produce images of the heart. Images from an echocardiogram can provide important information about: Heart size and shape. The size and thickness and movement of your heart's walls. Heart muscle function and strength. Heart valve function or if you have stenosis. Stenosis is when the heart valves are too narrow. If blood is flowing backward through the heart valves (regurgitation). A tumor or infectious growth around the heart valves. Areas of heart muscle that are not working well because of poor blood flow or injury from a heart attack. Aneurysm detection. An aneurysm is a weak or damaged part of an artery wall. The wall bulges out from the normal force of blood pumping through the body. Tell a health care provider about: Any allergies you  have. All medicines you are taking, including vitamins, herbs, eye drops, creams, and over-the-counter medicines. Any blood disorders you have. Any surgeries you have had. Any medical conditions you have. Whether you are pregnant or may be pregnant. What are the risks? Generally, this is a safe test. However, problems may occur, including an allergic reaction to dye (contrast) that may be used during the  test. What happens before the test? No specific preparation is needed. You may eat and drink normally. What happens during the test?  You will take off your clothes from the waist up and put on a hospital gown. Electrodes or electrocardiogram (ECG)patches may be placed on your chest. The electrodes or patches are then connected to a device that monitors your heart rate and rhythm. You will lie down on a table for an ultrasound exam. A gel will be applied to your chest to help sound waves pass through your skin. A handheld device, called a transducer, will be pressed against your chest and moved over your heart. The transducer produces sound waves that travel to your heart and bounce back (or "echo" back) to the transducer. These sound waves will be captured in real-time and changed into images of your heart that can be viewed on a video monitor. The images will be recorded on a computer and reviewed by your health care provider. You may be asked to change positions or hold your breath for a short time. This makes it easier to get different views or better views of your heart. In some cases, you may receive contrast through an IV in one of your veins. This can improve the quality of the pictures from your heart. The procedure may vary among health care providers and hospitals. What can I expect after the test? You may return to your normal, everyday life, including diet, activities, and medicines, unless your health care provider tells you not to do that. Follow these instructions at home: It is up to you to get the results of your test. Ask your health care provider, or the department that is doing the test, when your results will be ready. Keep all follow-up visits. This is important. Summary An echocardiogram is a test that uses sound waves (ultrasound) to produce images of the heart. Images from an echocardiogram can provide important information about the size and shape of your heart, heart  muscle function, heart valve function, and other possible heart problems. You do not need to do anything to prepare before this test. You may eat and drink normally. After the echocardiogram is completed, you may return to your normal, everyday life, unless your health care provider tells you not to do that. This information is not intended to replace advice given to you by your health care provider. Make sure you discuss any questions you have with your health care provider. Document Revised: 04/30/2021 Document Reviewed: 04/09/2020 Elsevier Patient Education  2022 [employer].

## 2021-08-13 ENCOUNTER — Telehealth: Payer: Self-pay

## 2021-08-13 ENCOUNTER — Other Ambulatory Visit: Payer: Self-pay | Admitting: Cardiovascular Disease

## 2021-08-13 NOTE — Chronic Care Management (AMB) (Signed)
° ° °  Chronic Care Management Pharmacy Assistant   Name: Catherine Green  MRN: [id number] DOB: 19-Jul-1941  Reason for Encounter: Reminder Call   Conditions to be addressed/monitored: CAD, HTN, and HLD    Medications: Outpatient Encounter Medications as of 08/13/2021  Medication Sig   aspirin 81 MG tablet Take 1 tablet (81 mg total) by mouth daily.   carvedilol (COREG) 6.25 MG tablet Take 1 tablet (6.25 mg total) by mouth 2 (two) times daily.   Coenzyme Q10 (CO Q10) 100 MG CAPS Take 100 mg by mouth daily.   ezetimibe (ZETIA) 10 MG tablet TAKE ONE TABLET EVERY DAY   furosemide (LASIX) 20 MG tablet Take 1 tablet (20mg ) Every Other Day alternating with 2 tablets (40mg ) Every Other Day   gabapentin (NEURONTIN) 400 MG capsule TAKE 1 CAPSULE BY MOUTH EVERY 8 HOURS   omeprazole (PRILOSEC) 20 MG capsule Take 1 capsule (20 mg total) by mouth daily.   potassium chloride (KLOR-CON) 10 MEQ tablet Take 10 mEq by mouth daily.   rosuvastatin (CRESTOR) 20 MG tablet Take 1 tablet (20 mg total) by mouth daily.   sertraline (ZOLOFT) 100 MG tablet Take 1.5 tablets (150 mg total) by mouth daily.   traMADol (ULTRAM) 50 MG tablet Take 1 tablet (50 mg total) by mouth in the morning.   vitamin B-12 (CYANOCOBALAMIN) 1000 MCG tablet Take 1 tablet (1,000 mcg total) by mouth daily.   No facility-administered encounter medications on file as of 08/13/2021.   Catherine Green was contacted to remind her of her upcoming telephone visit with Debbora Dus on 08/18/21 at 4:00pm. Patient was reminded to have all medications, supplements and any blood glucose and blood pressure readings available for review at appointment.   Are you having any problems with your medications? No  Do you have any concerns you like to discuss with the pharmacist? No    Star Rating Drugs: Medication:  Last Fill: Day Supply Rosuvastatin 20mg  07/29/21 [place] [facility] notified  Avel Sensor, [place]  Assistant [phone]  Total time spent for month CPA: 10 min

## 2021-08-14 ENCOUNTER — Ambulatory Visit: Payer: Medicare HMO

## 2021-08-14 ENCOUNTER — Encounter (HOSPITAL_COMMUNITY): Payer: Medicare HMO

## 2021-08-18 ENCOUNTER — Other Ambulatory Visit: Payer: Self-pay

## 2021-08-18 ENCOUNTER — Ambulatory Visit (INDEPENDENT_AMBULATORY_CARE_PROVIDER_SITE_OTHER): Payer: Medicare HMO | Admitting: Pharmacist

## 2021-08-18 DIAGNOSIS — I5032 Chronic diastolic (congestive) heart failure: Secondary | ICD-10-CM

## 2021-08-18 DIAGNOSIS — E782 Mixed hyperlipidemia: Secondary | ICD-10-CM

## 2021-08-18 DIAGNOSIS — I25810 Atherosclerosis of coronary artery bypass graft(s) without angina pectoris: Secondary | ICD-10-CM

## 2021-08-18 DIAGNOSIS — F32A Depression, unspecified: Secondary | ICD-10-CM

## 2021-08-18 DIAGNOSIS — I10 Essential (primary) hypertension: Secondary | ICD-10-CM

## 2021-08-18 NOTE — Progress Notes (Signed)
Chronic Care Management Pharmacy Note  08/22/2021 Name:  Catherine Green MRN:  [id number] DOB:  [birth year]  Summary: -Pt reports significant improvement in mood with higher sertraline dose (increased 05/2021) -BP is at goal at home on average (125/43 - 144/51)  Recommendations/Changes made from today's visit: -No med changes  Plan: -[facility] [place] will call patient in 3 months for BP check -Pharmacist follow up televisit scheduled for 6 months    Subjective: Catherine Green is an 80 y.o. year old female who is a primary patient of Damita Dunnings, Elveria Rising, MD.  The CCM team was consulted for assistance with disease management and care coordination needs.    Engaged with patient by telephone for follow up visit in response to provider referral for pharmacy case management and/or care coordination services.   Consent to Services:  The patient was given information about Chronic Care Management services, agreed to services, and gave verbal consent prior to initiation of services.  Please see initial visit note for detailed documentation.   Patient Care Team: Tonia Ghent, MD as PCP - General (Family Medicine) Rockey Situ Kathlene November, MD as PCP - Cardiology (Cardiology) Minna Merritts, MD (Cardiology) Earnie Larsson, MD as Consulting Physician (Neurosurgery) Debbora Dus, [facility] as Pharmacist (Pharmacist)  Recent office visits:  05/10/21-Family Medicine- Patient presented for AWV. No medication changes, patient up to date with vaccines, follow up 1 year. 04/21/21-Family Medicine-Patient presented for sinus pain and ear pain.started cefdinir 37m , use OTC meclizine,take antihistamine and flonase, up if symptoms fail to improve or worsen. 02/07/21-PCP-Patient presented with skin lesions, Not diabetic by A1c, 5.8. Referral to dermatology,try gabapentin again for scalp sensations.Likely lipomas- benign fatty deposits on the skin.    Recent consult visits:  02/26/21-Cardiology-Patient  presented for follow up coronary artery disease.EKG,lipid panel(cholesterol at goal) no medication changes. 02/07/21 - Cardiology, phone call - Recommendations:-Stop amlodipine -Increase Coreg to 6.25 mg bid to decrease PVC burden -Follow up as planned, will check magnesium at her next visit. 01/21/21-Cardiology-Patient presented for follow up chronic heart disease. Due to financial concerns she would like to undergo a rechallenge of Crestor in an effort to possibly come off of Repatha.  She does note significant pruritus at her Zio patch location. Start Rosuvastatin 268mfollow up 1 month. 01/14/21-Cardiology-Patient presented for follow up hypertension.Labs ordered,EKG,Plan for 14-day ZIO monitor,(Kidney function mild decline)change furosemide to alternate 2082mnd 15m34mery other day,change potassium to alternate 10me48md 20meq61mry other day. 01/08/21-Cardiology-Patient presented for shortness of breath.     Hospital visits:  None in previous 6 months   Objective:  Lab Results  Component Value Date   CREATININE 0.76 08/08/2021   BUN 18 08/08/2021   GFR 70.32 04/14/2021   GFRNONAA >60 08/08/2021   GFRAA 73 07/29/2020   NA 138 08/08/2021   K 4.3 08/08/2021   CALCIUM 9.3 08/08/2021   CO2 29 08/08/2021   GLUCOSE 107 (H) 08/08/2021    Lab Results  Component Value Date/Time   HGBA1C 5.8 (A) 02/07/2021 09:25 AM   HGBA1C 5.8 (A) 11/13/2019 09:26 AM   HGBA1C 7.1 (H) 02/01/2019 09:07 AM   HGBA1C 6.6 (H) 11/03/2018 10:57 AM   GFR 70.32 04/14/2021 11:29 AM   GFR 63.51 01/22/2020 10:01 AM   MICROALBUR 11.7 (H) 11/03/2018 10:57 AM     Lab Results  Component Value Date   CHOL 110 02/26/2021   HDL 66 02/26/2021   LDLCALC 28 02/26/2021   LDLDIRECT 139.1 04/11/2012   TRIG 80  02/26/2021   CHOLHDL 1.7 02/26/2021    Hepatic Function Latest Ref Rng & Units 04/14/2021 01/22/2020 11/27/2019  Total Protein 6.0 - 8.3 g/dL 6.5 6.6 6.1  Albumin 3.5 - 5.2 g/dL 4.3 4.4 4.2  AST 0 - 37 U/L 16 15  14   ALT 0 - 35 U/L 11 10 11   Alk Phosphatase 39 - 117 U/L 92 104 91  Total Bilirubin 0.2 - 1.2 mg/dL 0.4 0.7 0.6  Bilirubin, Direct 0.0 - 0.3 mg/dL - - -    Lab Results  Component Value Date/Time   TSH 1.150 07/29/2020 11:13 AM   TSH 1.06 04/22/2018 09:23 AM    CBC Latest Ref Rng & Units 08/08/2021 04/14/2021 01/14/2021  WBC 4.0 - 10.5 K/uL 6.9 6.1 7.0  Hemoglobin 12.0 - 15.0 g/dL 11.7(L) 11.7(L) 11.9  Hematocrit 36.0 - 46.0 % 37.4 35.8(L) 36.8  Platelets 150 - 400 K/uL 314 303.0 358    No results found for: VD25OH  Clinical ASCVD: Yes  The ASCVD Risk score (Arnett DK, et al., 2019) failed to calculate for the following reasons:   The 2019 ASCVD risk score is only valid for ages 13 to 9    Depression screen PHQ 2/9 05/10/2021 05/10/2021 05/10/2021  Decreased Interest 0 0 0  Down, Depressed, Hopeless 0 0 0  PHQ - 2 Score 0 0 0  Altered sleeping 0 0 0  Tired, decreased energy 0 0 0  Change in appetite 0 0 0  Feeling bad or failure about yourself  0 - 0  Trouble concentrating 0 0 0  Moving slowly or fidgety/restless 0 0 0  Suicidal thoughts 0 0 0  PHQ-9 Score 0 0 0  Difficult doing work/chores Not difficult at all Not difficult at all Not difficult at all  Some recent data might be hidden   Social History   Tobacco Use  Smoking Status Former   Packs/day: 0.30   Years: 15.00   Pack years: 4.50   Types: Cigarettes   Quit date: 08/31/1996   Years since quitting: 24.9  Smokeless Tobacco Never   BP Readings from Last 3 Encounters:  08/08/21 140/64  05/10/21 (!) 145/61  04/21/21 (!) 166/58   Pulse Readings from Last 3 Encounters:  08/08/21 63  05/10/21 (!) 57  04/21/21 62   Wt Readings from Last 3 Encounters:  08/08/21 140 lb (63.5 kg)  04/21/21 144 lb (65.3 kg)  04/01/21 139 lb (63 kg)   BMI Readings from Last 3 Encounters:  08/08/21 25.61 kg/m  04/21/21 26.34 kg/m  04/01/21 25.42 kg/m    Assessment/Interventions: Review of patient past medical history,  allergies, medications, health status, including review of consultants reports, laboratory and other test data, was performed as part of comprehensive evaluation and provision of chronic care management services.   SDOH:  (Social Determinants of Health) assessments and interventions performed: Yes   SDOH Screenings   Alcohol Screen: Not on file  Depression (PHQ2-9): Low Risk    PHQ-2 Score: 0  Financial Resource Strain: Low Risk    Difficulty of Paying Living Expenses: Not very hard  Food Insecurity: Not on file  Housing: Not on file  Physical Activity: Not on file  Social Connections: Not on file  Stress: Not on file  Tobacco Use: Medium Risk   Smoking Tobacco Use: Former   Smokeless Tobacco Use: Never   Passive Exposure: Not on file  Transportation Needs: Not on file    [place]  Allergies  Allergen Reactions  Lyrica [Pregabalin] Shortness Of Breath, Swelling and Other (See Comments)    chest tight   Hydrocodone Other (See Comments)    hallucinate   Ivp Dye [Iodinated Diagnostic Agents] Hives and Itching        Lipitor [Atorvastatin] Nausea And Vomiting   Oxycodone Hcl Other (See Comments)    Hallucinations    Penicillin G Nausea And Vomiting   Shellfish Allergy Hives and Itching   Clavulanic Acid    Prednisone    Augmentin [Amoxicillin-Pot Clavulanate] Nausea And Vomiting   Codeine Nausea And Vomiting and Other (See Comments)   Cymbalta [Duloxetine Hcl] Nausea Only    GI upset at 55m   Demerol Nausea And Vomiting   Doxycycline Nausea Only and Other (See Comments)    Weakness, sick to her stomach   Effexor [Venlafaxine] Nausea Only   Hydrochlorothiazide Other (See Comments)    Not an allergy but urinary frequency and sweating at 212m  Hydroxyzine Other (See Comments)    Excessive sweating.    Metoprolol Other (See Comments)    Slowed body down, per pt    Protonix [Pantoprazole Sodium] Nausea Only   Trazodone And Nefazodone Other (See Comments)     Sedation     Medications Reviewed Today     Reviewed by FoCharlton HawsRPHunt [facility]villePharmacist) on 08/22/21 at 10Finlandist Status: <None>   Medication Order Taking? Sig Documenting Provider Last Dose Status Informant  aspirin 81 MG tablet [id number] Take 1 tablet (81 mg total) by mouth daily. GoMinna MerrittsMD Taking Active   carvedilol (COREG) 6.25 MG tablet [id number] Take 1 tablet (6.25 mg total) by mouth 2 (two) times daily. DuRise MuPA-C Taking Active   Coenzyme Q10 (CO Q10) 100 MG CAPS [id number] Take 100 mg by mouth daily. [provider] Taking Active   ezetimibe (ZETIA) 10 MG tablet [id number] TAKE ONE TABLET EVERY DAY Gollan, TiKathlene NovemberMD Taking Active   furosemide (LASIX) 20 MG tablet [id number] Take 1 tablet (2033mEvery Other Day alternating with 2 tablets (33m62mvery Other Day WalkLoel Dubonnet Taking Active   gabapentin (NEURONTIN) 400 MG capsule [id number] TAKE 1 CAPSULE BY MOUTH EVERY 8 HOURS DuncTonia Ghent Taking Active   omeprazole (PRILOSEC) 20 MG capsule [id number] Take 1 capsule (20 mg total) by mouth daily. DuncTonia Ghent Taking Active   potassium chloride (KLOR-CON) 10 MEQ tablet [id number] Take 10 mEq by mouth daily. [provider] Taking Active   rosuvastatin (CRESTOR) 20 MG tablet [id number] 1 tablet (20 mg total) by mouth daily. DunnRise Mu-C  Expired 08/17/21 2359   sertraline (ZOLOFT) 100 MG tablet [id number] Take 1.5 tablets (150 mg total) by mouth daily. DuncTonia Ghent Taking Active   traMADol (ULTVeatrice Bourbon MG tablet [id number] Take 1 tablet (50 mg total) by mouth in the morning. DuncTonia Ghent Taking Active   vitamin B-12 (CYANOCOBALAMIN) 1000 MCG tablet [id number] Take 1 tablet (1,000 mcg total) by mouth daily. DuncTonia Ghent Taking Active             Patient Active Problem List   Diagnosis Date Noted   Acute recurrent frontal sinusitis 04/21/2021    Impacted cerumen of right ear 04/21/2021   History of diabetes mellitus 02/09/2021   Scalp irritation 11/13/2020   Seasonal allergies 07/12/2019   Anemia 02/05/2019   Rash 09/04/2018  GERD (gastroesophageal reflux disease) 09/04/2018   Elevated CK 07/30/2018   Neuropathy 04/24/2018   Health care maintenance 01/19/2018   Medicare annual wellness visit, initial 01/19/2018   Grief 10/07/2017   Sweating abnormality 05/28/2017   Insomnia 05/28/2017   Fatigue 01/15/2017   Dysuria 05/06/2016   Skin lesion 03/16/2016   Aftercare following surgery of the circulatory system 02/18/2016   Spinal stenosis of lumbar region 08/21/2015   Labile blood pressure 05/17/2015   Leg pain, bilateral 02/08/2015   SOB (shortness of breath) 02/08/2015   Cough 01/17/2015   Acute non-recurrent sinusitis 12/21/2014   Low back pain 11/05/2014   Benign paroxysmal positional vertigo 09/04/2014   Carotid stenosis 07/03/2014   Preoperative cardiovascular examination 03/07/2014   Dysphagia, pharyngoesophageal phase 12/25/2013   Acute on chronic diastolic CHF (congestive heart failure) ([place]) 11/23/2013   Palpitations 11/23/2013   Urticaria 04/05/2013   Screening for colon cancer 05/27/2012   Hyperlipidemia 05/04/2012   Dizziness 04/11/2012   Anxiety and depression 03/10/2012   Carotid artery disease ([place]) 12/22/2011   Other and unspecified hyperlipidemia 10/08/2009   Essential hypertension 10/08/2009   CAD, ARTERY BYPASS GRAFT 10/08/2009   PVD 10/08/2009   SYNCOPE, HX OF 10/08/2009   THROMBOPHLEBITIS, HX OF 10/08/2009    Immunization History  Administered Date(s) Administered   Fluad Quad(high Dose 65+) 05/11/2019, 07/08/2020   Influenza Split 05/27/2012   Influenza Whole 06/20/2013   Influenza, High Dose Seasonal PF 05/27/2018   Influenza, Seasonal, Injecte, Preservative Fre 06/24/2016   Influenza,inj,Quad PF,6+ Mos 06/14/2014, 06/07/2015, 05/24/2017   PFIZER(Purple Top)SARS-COV-2 Vaccination  09/05/2019, 09/26/2019, 06/12/2020   Pneumococcal Conjugate-13 01/07/2017   Pneumococcal Polysaccharide-23 03/11/2011   Tdap 05/27/2012   Zoster, Live 03/11/2011    Conditions to be addressed/monitored:  Hypertension, Hyperlipidemia, Heart Failure, Coronary Artery Disease, GERD, Depression, and Anxiety   Care Plan : [place]  Updates made by Charlton Haws, [place] since 08/22/2021 12:00 AM     Problem: Hypertension, Hyperlipidemia, Heart Failure, Coronary Artery Disease, GERD, Depression, and Anxiety   Priority: High  Onset Date: 06/16/2021     Long-Range Goal: Disease mgmt   Start Date: 08/22/2021  Expected End Date: 08/22/2022  This Visit's Progress: On track  Priority: High  Note:   Current Barriers:  Unable to independently monitor therapeutic efficacy  Pharmacist Clinical Goal(s):  Patient will achieve adherence to monitoring guidelines and medication adherence to achieve therapeutic efficacy through collaboration with PharmD and provider.   Interventions: 1:1 collaboration with Tonia Ghent, MD regarding development and update of comprehensive plan of care as evidenced by provider attestation and co-signature Inter-disciplinary care team collaboration (see longitudinal plan of care) Comprehensive medication review performed; medication list updated in electronic medical record  Hypertension (BP goal <140/90) -Controlled - BP at home fluctuates but is at goal most of the time; -Managed by cardiology; permissive hypertension given prior issues with orthostasis -Current home readings: 144/51, 68; 125/43, 129/53, 131/49 -Current treatment: Carvedilol 6.25 mg - 1 tablet twice daily (increased dose 6/22) -Medications previously tried: amlodipine - stopped 6/22  -Denies hypotensive/hypertensive symptoms - she states the heart palpitations have improved with carvedilol dose increase  -Educated on BP goals and benefits of medications for prevention of  heart attack, stroke and kidney damage; -Counseled to monitor BP at home weekly -Recommended to continue current medication  Hyperlipidemia/CAD/PAD (LDL goal < 70) -Controlled - LDL within goal  -Current treatment: Ezetimibe 10 mg - 1 tablet daily Rosuvastatin 20 mg - 1 tablet daily  Aspirin 81 mg - 1 tablet daily -Medications previously tried: Repatha (Cost), simvastatin, atorvastatin -Educated on Cholesterol goals; Benefits of statin for ASCVD risk reduction; -Recommended to continue current medication  Heart Failure (Goal: manage symptoms and prevent exacerbations) -Controlled -Denies swelling with Lasix  -Last ejection fraction: 60-65% (10/7515); diastolic parameters indeterminate -HF type: Diastolic; NYHA Class: I (no actitivty limitation) -Current treatment: Lasix 20 mg - alternate with 40 mg every other day Potassium chloride 10 mEq - 1 tablet daily  Carvedilol 6.25 mg BID -Medications previously tried: none   -Educated on Proper diuretic administration and potassium supplementation -Recommended to continue current medication  Depression/Anxiety (Goal: Improve mood) -Improving - pt reports higher dose of sertraline has helped a lot, she is feeling much better recently, spending time with remaining family -Grief reaction - loss of her daughter 30 and multiple family members this July 2022 -Current treatment: Sertraline 150 mg - 1 tablet daily (increase dose 05/2021) -Medications previously tried/failed: venlafaxine  -Recommend to continue current medication  GERD (Goal: Improve symptoms) -Controlled -Current treatment  OTC famotidine 20 mg HS Omeprazole 20 mg daily - Rx is expired -Medications previously tried: omeprazole - patient discontinued due to  Pepcid more effective -Recommended to continue current medication  Chronic pain (Goal: manage pain) -Controlled -Current treatment  Gabapentin 400 mg TID Tramadol 50 mg PRN - not taking often -Discussed fall risk;  pt denies oversedation -Recommended to continue current medication  Patient Goals/Self-Care Activities Patient will:  - take medications as prescribed as evidenced by patient report and record review focus on medication adherence by pill box check blood pressure weekly, document, and provide at future appointments      Medication Assistance: None required.  Patient affirms current coverage meets needs.  Compliance/Adherence/Medication fill history: Care Gaps: None   Star-Rating Drugs: Medication:                Last Fill:         Day Supply Rosuvastatin 63m     04/18/21            100  [facility] [id number]: [phone]: [phone] Care Plan and Follow Up Patient Decision:  Patient agrees to Care Plan and Follow-up.  Follow Up Plan: Telephone follow up appointment with care management team member scheduled for: 6 months  LCharlene Brooke PharmD, BCACP Clinical Pharmacist [phone]

## 2021-08-22 NOTE — Patient Instructions (Signed)
Visit Information  Phone number for Pharmacist: [phone]   Goals Addressed             This Visit's Progress    Track and Manage My Blood Pressure-Hypertension       Timeframe:  Long-Range Goal Priority:  Medium Start Date:  08/18/21                            Expected End Date:     08/18/22                  Follow Up Date June 2023   - check blood pressure weekly - choose a place to take my blood pressure (home, clinic or office, retail store) - write blood pressure results in a log or diary    Why is this important?   You won't feel high blood pressure, but it can still hurt your blood vessels.  High blood pressure can cause heart or kidney problems. It can also cause a stroke.  Making lifestyle changes like losing a little weight or eating less salt will help.  Checking your blood pressure at home and at different times of the day can help to control blood pressure.  If the doctor prescribes medicine remember to take it the way the doctor ordered.  Call the office if you cannot afford the medicine or if there are questions about it.     Notes:         Care Plan : [place]  Updates made by Charlton Haws, RPH since 08/22/2021 12:00 AM     Problem: Hypertension, Hyperlipidemia, Heart Failure, Coronary Artery Disease, GERD, Depression, and Anxiety   Priority: High  Onset Date: 06/16/2021     Long-Range Goal: Disease mgmt   Start Date: 08/22/2021  Expected End Date: 08/22/2022  This Visit's Progress: On track  Priority: High  Note:   Current Barriers:  Unable to independently monitor therapeutic efficacy  Pharmacist Clinical Goal(s):  Patient will achieve adherence to monitoring guidelines and medication adherence to achieve therapeutic efficacy through collaboration with PharmD and provider.   Interventions: 1:1 collaboration with Tonia Ghent, MD regarding development and update of comprehensive plan of care as evidenced by  provider attestation and co-signature Inter-disciplinary care team collaboration (see longitudinal plan of care) Comprehensive medication review performed; medication list updated in electronic medical record  Hypertension (BP goal <140/90) -Controlled - BP at home fluctuates but is at goal most of the time; -Managed by cardiology; permissive hypertension given prior issues with orthostasis -Current home readings: 144/51, 68; 125/43, 129/53, 131/49 -Current treatment: Carvedilol 6.25 mg - 1 tablet twice daily (increased dose 6/22) -Medications previously tried: amlodipine - stopped 6/22  -Denies hypotensive/hypertensive symptoms - she states the heart palpitations have improved with carvedilol dose increase  -Educated on BP goals and benefits of medications for prevention of heart attack, stroke and kidney damage; -Counseled to monitor BP at home weekly -Recommended to continue current medication  Hyperlipidemia/CAD/PAD (LDL goal < 70) -Controlled - LDL within goal  -Current treatment: Ezetimibe 10 mg - 1 tablet daily Rosuvastatin 20 mg - 1 tablet daily  Aspirin 81 mg - 1 tablet daily -Medications previously tried: Repatha (Cost), simvastatin, atorvastatin -Educated on Cholesterol goals; Benefits of statin for ASCVD risk reduction; -Recommended to continue current medication  Heart Failure (Goal: manage symptoms and prevent exacerbations) -Controlled -Denies swelling with Lasix  -Last ejection fraction: 60-65% (11/3327); diastolic parameters  indeterminate -HF type: Diastolic; NYHA Class: I (no actitivty limitation) -Current treatment: Lasix 20 mg - alternate with 40 mg every other day Potassium chloride 10 mEq - 1 tablet daily  Carvedilol 6.25 mg BID -Medications previously tried: none   -Educated on Proper diuretic administration and potassium supplementation -Recommended to continue current medication  Depression/Anxiety (Goal: Improve mood) -Improving - pt reports higher dose  of sertraline has helped a lot, she is feeling much better recently, spending time with remaining family -Grief reaction - loss of her daughter 84 and multiple family members this July 2022 -Current treatment: Sertraline 150 mg - 1 tablet daily (increase dose 05/2021) -Medications previously tried/failed: venlafaxine  -Recommend to continue current medication  GERD (Goal: Improve symptoms) -Controlled -Current treatment  OTC famotidine 20 mg HS Omeprazole 20 mg daily - Rx is expired -Medications previously tried: omeprazole - patient discontinued due to  Pepcid more effective -Recommended to continue current medication  Chronic pain (Goal: manage pain) -Controlled -Current treatment  Gabapentin 400 mg TID Tramadol 50 mg PRN - not taking often -Discussed fall risk; pt denies oversedation -Recommended to continue current medication  Patient Goals/Self-Care Activities Patient will:  - take medications as prescribed as evidenced by patient report and record review focus on medication adherence by pill box check blood pressure weekly, document, and provide at future appointments       Patient verbalizes understanding of instructions provided today and agrees to view in [place].  Telephone follow up appointment with pharmacy team member scheduled for: 6 months  Charlene Brooke, PharmD, [facility] Clinical Pharmacist [facility] [phone]

## 2021-08-28 ENCOUNTER — Other Ambulatory Visit: Payer: Self-pay | Admitting: Family Medicine

## 2021-08-28 NOTE — Telephone Encounter (Signed)
Refill request for GABAPENTIN 400 MG CAP  LOV - 05/10/21 Next OV - not scheduled Last refill - 04/02/21 #90/5

## 2021-08-29 NOTE — Telephone Encounter (Signed)
Sent. Thanks.   

## 2021-08-30 DIAGNOSIS — R69 Illness, unspecified: Secondary | ICD-10-CM | POA: Diagnosis not present

## 2021-08-30 DIAGNOSIS — F418 Other specified anxiety disorders: Secondary | ICD-10-CM

## 2021-08-30 DIAGNOSIS — I110 Hypertensive heart disease with heart failure: Secondary | ICD-10-CM

## 2021-08-30 DIAGNOSIS — E782 Mixed hyperlipidemia: Secondary | ICD-10-CM

## 2021-08-30 DIAGNOSIS — I5032 Chronic diastolic (congestive) heart failure: Secondary | ICD-10-CM

## 2021-08-30 DIAGNOSIS — I25810 Atherosclerosis of coronary artery bypass graft(s) without angina pectoris: Secondary | ICD-10-CM | POA: Diagnosis not present

## 2021-09-02 ENCOUNTER — Ambulatory Visit: Payer: Medicare HMO | Admitting: Cardiovascular Disease

## 2021-09-09 ENCOUNTER — Other Ambulatory Visit: Payer: Self-pay

## 2021-09-09 ENCOUNTER — Ambulatory Visit: Payer: Medicare HMO | Admitting: Physician Assistant

## 2021-09-09 ENCOUNTER — Ambulatory Visit (HOSPITAL_COMMUNITY)
Admission: RE | Admit: 2021-09-09 | Discharge: 2021-09-09 | Disposition: A | Discharge status: Home / Self Care | Payer: Medicare HMO | Source: Ambulatory Visit | Attending: Vascular Surgery | Admitting: Vascular Surgery

## 2021-09-09 VITALS — BP 146/65 | HR 58 | Temp 98.3°F | Resp 18 | Ht 62.0 in | Wt 141.5 lb

## 2021-09-09 DIAGNOSIS — I6523 Occlusion and stenosis of bilateral carotid arteries: Secondary | ICD-10-CM

## 2021-09-09 NOTE — Progress Notes (Signed)
HISTORY AND PHYSICAL     CC:  follow up. Requesting Provider:  Tonia Ghent, MD  HPI: This is a 81 y.o. female here for follow up for carotid artery stenosis.  Pt is s/p right CEA in 1992 and redo CEA in 2016 by [name] for right carotid false aneurysm.  She also underwent  left CEA on 07/20/2014 also by [name] for asymptomatic carotid artery stenosis.   Pt was last seen December 2021 and at that time she was asymptomatic.  She was having some trouble with her back and legs giving way.  Pt returns today for follow up.    Pt denies any amaurosis fugax, speech difficulties, weakness, numbness, paralysis or clumsiness or facial droop.  She continues to have problems with her legs giving way.  She did have an ABI in February 2022 that was normal.    The pt is on a statin for cholesterol management.  The pt is on a daily aspirin.   Other AC:  none The pt is on BB for hypertension.   The pt is not diabetic.   Tobacco hx:  former  Pt does not have family hx of AAA.  Past Medical History:  Diagnosis Date   Allergy    hay fever   Anemia    Anxiety    Back pain    Carotid artery occlusion    Cerebrovascular disease    extracranial; occlusive   Chicken pox    Coronary artery disease    Depression    Dizziness    Dizziness    DVT (deep venous thrombosis) (HCC)    Fainting spell    GERD (gastroesophageal reflux disease)    Headache    Heart murmur    Hyperlipidemia    Hypertension    Leg pain    PONV (postoperative nausea and vomiting)    severe nausea and vomiting   Pre-syncope    PVD (peripheral vascular disease) ([place])    endarterectomy by [name]   Seasonal allergies    Shortness of breath dyspnea    wth ambulation at times   Swelling of both ankles    and abdomen; takes Lasix when needed   Thrombophlebitis    following childbirth   Ulcer     Past Surgical History:  Procedure Laterality Date   APPENDECTOMY     BACK SURGERY  01/14/16   BREAST SURGERY      saline implants   CARDIAC CATHETERIZATION     CAROTID ENDARTERECTOMY  1992   CAROTID ENDARTERECTOMY Right February 13, 2005   Re-do Right CE   CATARACT EXTRACTION, BILATERAL  2013   CHOLECYSTECTOMY     CHOLECYSTECTOMY, LAPAROSCOPIC  04/02/14   Dr. Rochel Brome   CORONARY ARTERY BYPASS GRAFT  2006   x3 Dr. Prescott Gum   ENDARTERECTOMY Left 07/20/2014   Procedure: ENDARTERECTOMY CAROTID-LEFT;  Surgeon: Rosetta Posner, MD;  Location: [facility];  Service: Vascular;  Laterality: Left;   EYE SURGERY Bilateral Feb. 2013   Cataract Left eye   SEPTOPLASTY     with bilateral inferior turbinate reductions   SPINE SURGERY  12/2015   TOTAL ABDOMINAL HYSTERECTOMY     UPPER GASTROINTESTINAL ENDOSCOPY     had polyps removed from esophagus   UPPER GI ENDOSCOPY      Allergies  Allergen Reactions   Lyrica [Pregabalin] Shortness Of Breath, Swelling and Other (See Comments)    chest tight   Hydrocodone Other (See Comments)  hallucinate   Ivp Dye [Iodinated Contrast Media] Hives and Itching        Lipitor [Atorvastatin] Nausea And Vomiting   Oxycodone Hcl Other (See Comments)    Hallucinations    Penicillin G Nausea And Vomiting   Shellfish Allergy Hives and Itching   Clavulanic Acid    Prednisone    Augmentin [Amoxicillin-Pot Clavulanate] Nausea And Vomiting   Codeine Nausea And Vomiting and Other (See Comments)   Cymbalta [Duloxetine Hcl] Nausea Only    GI upset at 30mg    Demerol Nausea And Vomiting   Doxycycline Nausea Only and Other (See Comments)    Weakness, sick to her stomach   Effexor [Venlafaxine] Nausea Only   Hydrochlorothiazide Other (See Comments)    Not an allergy but urinary frequency and sweating at 25mg    Hydroxyzine Other (See Comments)    Excessive sweating.    Metoprolol Other (See Comments)    Slowed body down, per pt    Protonix [Pantoprazole Sodium] Nausea Only   Trazodone And Nefazodone Other (See Comments)    Sedation     Current Outpatient Medications   Medication Sig Dispense Refill   aspirin 81 MG tablet Take 1 tablet (81 mg total) by mouth daily. 30 tablet    carvedilol (COREG) 6.25 MG tablet Take 1 tablet (6.25 mg total) by mouth 2 (two) times daily. 180 tablet 3   Coenzyme Q10 (CO Q10) 100 MG CAPS Take 100 mg by mouth daily.     ezetimibe (ZETIA) 10 MG tablet TAKE ONE TABLET EVERY DAY 90 tablet 3   furosemide (LASIX) 20 MG tablet Take 1 tablet (20mg ) Every Other Day alternating with 2 tablets (40mg ) Every Other Day 45 tablet 3   gabapentin (NEURONTIN) 400 MG capsule TAKE 1 CAPSULE BY MOUTH EVERY 8 HOURS 90 capsule 5   omeprazole (PRILOSEC) 20 MG capsule Take 1 capsule (20 mg total) by mouth daily. 30 capsule 3   potassium chloride (KLOR-CON) 10 MEQ tablet Take 10 mEq by mouth daily.     sertraline (ZOLOFT) 100 MG tablet Take 1.5 tablets (150 mg total) by mouth daily. 135 tablet 1   traMADol (ULTRAM) 50 MG tablet Take 1 tablet (50 mg total) by mouth in the morning.     vitamin B-12 (CYANOCOBALAMIN) 1000 MCG tablet Take 1 tablet (1,000 mcg total) by mouth daily.     rosuvastatin (CRESTOR) 20 MG tablet Take 1 tablet (20 mg total) by mouth daily. 100 tablet 0   No current facility-administered medications for this visit.    Family History  Problem Relation Age of Onset   Aneurysm Mother        brain   Heart disease Mother        Aneyursm    Hyperlipidemia Mother    Hypertension Mother    Varicose Veins Mother    Bleeding Disorder Mother    Heart disease Father    Cirrhosis Father    Heart attack Father    Hyperlipidemia Father    Hypertension Father    Cancer Sister        lung   Heart disease Sister        Aneurysm   Hyperlipidemia Sister    Hypertension Sister    Varicose Veins Sister    Bleeding Disorder Sister    Heart disease Brother        Before age 46   Aneurysm Brother    Deep vein thrombosis Brother    Birth defects Brother  Hyperlipidemia Brother    Hypertension Brother    Peripheral vascular disease  Daughter    Hyperlipidemia Daughter    Hypertension Daughter    Hypertension Other    Breast cancer Neg Hx    Colon cancer Neg Hx     Social History   Socioeconomic History   Marital status: Married    Spouse name: Jeneen Rinks    Number of children: 1   Years of education: Not on file   Highest education level: 11th grade  Occupational History    Comment: Retired   Tobacco Use   Smoking status: Former    Packs/day: 0.30    Years: 15.00    Pack years: 4.50    Types: Cigarettes    Quit date: 08/31/1996    Years since quitting: 25.0   Smokeless tobacco: Never  Vaping Use   Vaping Use: Never used  Substance and Sexual Activity   Alcohol use: No    Alcohol/week: 0.0 standard drinks   Drug use: No   Sexual activity: Not Currently  Other Topics Concern   Not on file  Social History Narrative   Lives with husband   Work - retired Emergency planning/management officer   Diet - healthy   Right handed    Caffeine- 1 cup per day.   Social Determinants of Health   Financial Resource Strain: Low Risk    Difficulty of Paying Living Expenses: Not very hard  Food Insecurity: Not on file  Transportation Needs: Not on file  Physical Activity: Not on file  Stress: Not on file  Social Connections: Not on file  Intimate Partner Violence: Not on file     REVIEW OF SYSTEMS:   [X]  denotes positive finding, [ ]  denotes negative finding Cardiac  Comments:  Chest pain or chest pressure:    Shortness of breath upon exertion:    Short of breath when lying flat:    Irregular heart rhythm:        Vascular    Pain in calf, thigh, or hip brought on by ambulation:    Pain in feet at night that wakes you up from your sleep:     Blood clot in your veins:    Leg swelling:         Pulmonary    Oxygen at home:    Productive cough:     Wheezing:         Neurologic    Sudden weakness in arms or legs:     Sudden numbness in arms or legs:     Sudden onset of difficulty speaking or slurred speech:    Temporary  loss of vision in one eye:     Problems with dizziness:         Gastrointestinal    Blood in stool:     Vomited blood:         Genitourinary    Burning when urinating:     Blood in urine:        Psychiatric    Major depression:         Hematologic    Bleeding problems:    Problems with blood clotting too easily:        Skin    Rashes or ulcers:        Constitutional    Fever or chills:      PHYSICAL EXAMINATION:  Today's Vitals   09/09/21 1342 09/09/21 1345  BP: (!) 151/62 (!) 146/65  Pulse: (!) 58  Resp: 18   Temp: 98.3 F (36.8 C)   TempSrc: Temporal   SpO2: 96%   Weight: 141 lb 8 oz (64.2 kg)   Height: 5\' 2"  (1.575 m)   PainSc: 0-No pain    Body mass index is 25.88 kg/m.   General:  WDWN in NAD; vital signs documented above Gait: Not observed HENT: WNL, normocephalic Pulmonary: normal non-labored breathing Cardiac: regular HR, with carotid bruits bilaterally Abdomen: soft, NT; aortic pulse is not palpable Skin: without rashes Vascular Exam/Pulses: DP pulses are palpable bilaterally Extremities: without ischemic changes, without Gangrene , without cellulitis; without open wounds Musculoskeletal: no muscle wasting or atrophy  Neurologic: A&O X 3; moving all extremities equally; speech is fluent/normal Psychiatric:  The pt has Normal affect.   Non-Invasive Vascular Imaging:   Carotid Duplex on 09/09/2021: Right:  1-39% ICA stenosis Left:  1-39% ICA stenosis Vertebrals:  Bilateral vertebral arteries demonstrate antegrade flow.  Subclavians: Normal flow hemodynamics were seen in bilateral subclavian arteries.  Previous Carotid duplex on 08/08/2020: Right: 1-39% ICA stenosis Left:   1-39% ICA stenosis    ASSESSMENT/PLAN:: 81 y.o. female here for follow up carotid artery stenosis and is s/p right CEA in 1992 and redo CEA in 2016 by [name] for right carotid false aneurysm.  She also underwent  left CEA on 07/20/2014 also by [name] for  asymptomatic carotid artery stenosis.   -duplex today reveals 1-39% bilateral ICA stenosis and she remains asymptomatic.   -discussed s/s of stroke with pt and she understands should she develop any of these sx, she will go to the nearest ER or call 911. -pt will f/u in one year with carotid duplex -pt will call sooner should they have any issues. -continue statin/asa  -pt has palpable DP pulses bilaterally and had a normal ABI in February 2022  Leontine Locket, [facility] [phone]  Clinic MD:  Carlis Abbott

## 2021-09-11 DIAGNOSIS — L218 Other seborrheic dermatitis: Secondary | ICD-10-CM | POA: Diagnosis not present

## 2021-09-15 ENCOUNTER — Ambulatory Visit (INDEPENDENT_AMBULATORY_CARE_PROVIDER_SITE_OTHER): Payer: Medicare HMO

## 2021-09-15 ENCOUNTER — Other Ambulatory Visit: Payer: Self-pay

## 2021-09-15 DIAGNOSIS — R0602 Shortness of breath: Secondary | ICD-10-CM

## 2021-09-15 DIAGNOSIS — I340 Nonrheumatic mitral (valve) insufficiency: Secondary | ICD-10-CM | POA: Diagnosis not present

## 2021-09-18 LAB — ECHOCARDIOGRAM COMPLETE
AR max vel: 1.66 cm2
AV Area VTI: 1.55 cm2
AV Area mean vel: 1.61 cm2
AV Mean grad: 6 mmHg
AV Peak grad: 9.6 mmHg
Ao pk vel: 1.55 m/s
Area-P 1/2: 4.46 cm2
Calc EF: 58.4 %
MV M vel: 5.17 m/s
MV Peak grad: 106.9 mmHg
MV VTI: 1.69 cm2
Radius: 0.3 cm
S' Lateral: 3.1 cm
Single Plane A2C EF: 57.4 %
Single Plane A4C EF: 59.5 %

## 2021-09-20 NOTE — Progress Notes (Signed)
Cardiology Office Note    Date:  09/25/2021   ID:  Catherine Green, DOB [birth year], MRN [id number]  PCP:  Tonia Ghent, MD  Cardiologist:  Ida Rogue, MD  Electrophysiologist:  None   Chief Complaint: Follow-up  History of Present Illness:   Catherine Green is a 81 y.o. female with history of CAD status post three-vessel CABG in 2006, HFpEF, bilateral carotid artery disease status post bilateral CEA followed by vascular surgery, HTN, HLD, frequent PVCs, atrial tachycardia, multiple medication intolerances, back pain status post surgery, prior tobacco use starting at age 68 and quitting at age 89 who presents for follow-up of echo.   Most recent cardiac cath from 09/2007 demonstrated an atretic LIMA to LAD with patent vein grafts.   She was seen at her PCPs office in 10/2019 with dyspnea.  BNP mildly elevated at 300.  Her symptoms were felt to be likely multifactorial including volume overload and mild longstanding anemia.  She was started on Lasix 40 mg daily.  Echo in 11/2019 showed a hyperdynamic LVSF with an EF of 65 to 69%, grade 3 diastolic dysfunction, normal RV systolic function and RV cavity size, and trivial mitral regurgitation.  Unable to estimate right heart pressures, IVC not documented as being dilated.  Small pleural effusion was noted.  Following this echo she was taking Lasix 40 mg twice daily with continued swelling.  Recheck at her PCPs office in late 12/2019 demonstrated her weight was down 6 pounds, however she continued to note bilateral pedal edema and abdominal bloating as the day progressed.  She continued to have some occasional dyspnea in the afternoons.  Her appetite remained down.  She was seen in the office on 02/06/2020 noting increased dyspnea, PND, 2-3 pillow orthopnea, and increased fatigue.  BP was elevated in the 794I to 016P systolic.  Her weight was down 8 pounds in the past 3 months with notes indicating she was missing meals.  She reported nausea when eating  with associated dysphagia.  Repeat BNP mildly improved at 239 with stable renal function.  She was taking Lasix 40 mg daily.  It was felt there was some component of venous insufficiency with her lower extremity swelling.  It was recommended she increase her Lasix to 40 mg twice daily for 1 week followed by 40 mg daily thereafter.  She was seen in 02/2020 noting improvement in her dyspnea following diuresis and had continued to take Lasix 40 mg twice daily since her visit in 01/2020.  Her weight is down another 6 pounds when compared to her last clinic visit.  She did note some positional dizziness and generalized fatigue.  She was orthostatic in the office that day.  Lasix was decreased to 40 mg daily.  Symptoms felt similar to her presentation leading up to her bypass.  In this setting, she underwent Lexiscan MPI on 03/15/2020 which showed no significant ischemia, normal wall motion, EF 76%, attenuation corrected images with aortic atherosclerosis and coronary artery calcifications, and was overall a low risk scan.  EKG showed PVCs.  CTA of the neck from 04/23/2020 showed no dissection or pseudoaneurysm without interval growth of remodeling/fusiform aneurysm within the right internal carotid artery, 30% left ICA stenosis, and 70% stenosis of the brachiocephalic origin.  There was 40% subclavian artery stenosis.  She was seen in the office on 04/29/2020 noting her dizziness persisted.  Her weight was stable.  She had further decreased her Lasix to 20 mg daily in the setting of  ongoing dizziness.  She denied any shortness of breath or lower extremity swelling.  Orthostatic vital signs remained positive at that visit.  Imdur was discontinued and carvedilol was decreased to 3.125 mg twice daily.  Following this, she contacted our office in 05/2020, noting continued orthostasis, and in this setting carvedilol was held.  She was seen in the office on 05/29/2020, and continued to note positional dizziness.  She was no longer on  carvedilol or Imdur though was taking Lasix 40 mg daily.  Home BP readings were predominantly in the 387F to 643P systolic.  Her dizziness was noted to date back at least to 02/2018.  She declined head CT.  She was advised to follow-up with her PCP.  It was again recommended her Lasix be decreased to 20 mg daily.  She was hypertensive when lying for orthostatic BP with orthostatics continuing to be mildly positive.  Renal artery ultrasound showed no evidence of renal artery stenosis.  She was seen in the office in 07/2020 noting some improvement in her chronic dizziness.  BP readings ranged from the 295J to 884 systolic with most heart rates in the 60s bpm. Carotid artery ultrasound in 07/2020, showed 1-39% bilateral ICA stenosis, antegrade flow along the vertebral arteries bilaterally, normal flow hemodynamics in the bilateral subclavian arteries. Bilateral ABI's were normal in 10/2020.    She was seen in 10/2020 and was doing well from a cardiac perspective.  She did continue to note a significant improvement in her chronic dizziness when having permissive BP.  Home BP readings were in the 150s to 160s for the most part with occasional readings in the 166A systolic.  She was frequently taking prn Coreg, and in this setting, it was recommended she take Coreg 3.125 mg bid.    She was seen on 01/08/2021 noting a several week history of elevated BP readings, particularly in the morning with many readings in the 160s to 630Z mmHg systolic.  ReDs vest was elevated at 39%.  It was recommended she start amlodipine 5 mg nightly and continue carvedilol 3.125 mg twice daily.  Her Lasix was titrated to 40 mg daily for 3 days.  Labs obtained at that time showed a BNP of 163.  She underwent echo on 01/10/2021 which showed an EF of 60 to 65%, no regional wall motion abnormalities, indeterminate LV diastolic function parameters, normal RV systolic function and ventricular cavity size, and moderate mitral regurgitation.  She was  seen in the office on 01/14/2021 with stable weight and BP of 170/64 in the office.  She continued to note unchanged longstanding lightheadedness and dizziness.  She remained very anxious.  Her shortness of breath did improve some following increased diuresis.  She did note some palpitations.  Given prior noted orthostatic hypotension permissive hypertension was recommended and she was continued on carvedilol and amlodipine.  Lasix was adjusted to 40 mg alternating with 20 mg every other day.  She underwent Zio patch which showed a predominant rhythm of sinus with an average heart rate of 62 bpm (range 48 to 135 bpm), 3 runs of SVT/atrial tachycardia were noted with the fastest interval lasting 9 beats with a maximal rate of 135 bpm and the longest interval lasting 14 seconds with an average rate of 102 bpm.  There were frequent PVCs representing a 13.4% burden.  No patient triggered events were observed.  Given this, it was recommended amlodipine be discontinued and carvedilol be titrated to 6.25 mg twice daily to improve with PVC burden.  She was seen in the office in 01/2021, by her primary cardiologist, and noted rare dizziness along with increased stress.  Weight was stable.  No changes were made.  She was last seen in the office on 08/08/2021 and was grieving the loss of her sister, daughter, and granddaughter.  With this, she noted intermittent episodes of shortness of breath, particularly when upset.  With her grief, she had not had much of an appetite.  Her weight was down 5 pounds when compared to her prior clinic visit.  She had been started on Zoloft by her PCP, though had not noted an improvement in her grief.  She was without symptoms of angina or decompensation.  She underwent echo on 09/15/2021 which demonstrated an EF of 60 to 65%, no regional wall motion abnormalities, grade 2 diastolic dysfunction, normal RV systolic function and ventricular cavity size, mildly elevated PASP estimated at 43.7  mmHg, moderate mitral regurgitation, and an estimated right atrial pressure of 3 mmHg.  She comes in today continuing to feel poorly.  She notes intermittent substernal chest tightness that radiates to the left upper arm.  Symptoms will last for several minutes and spontaneously resolved.  With these episodes she notes increased fatigue.  The pain itself does not feel like her prior angina leading up to her bypass, though the fatigue is.  She also continues to note positional dizziness and generalized weakness/malaise.  She reports an episode several days ago where her legs gave out from under her while walking down her hallway.  She did not fall or suffer LOC.  She reports she generally feels well first thing in the morning then starts to feel quite poorly after taking her morning dose of carvedilol.  She notes no difference in the way she feels on days where she takes 40 mg of Lasix versus 20 mg of Lasix.  Her weight loss, in the context of her recent losses, has decreased with her weight more stable at this time.  Her appetite has started to pick back up.  She does try and stay well-hydrated.  She is currently without symptoms of angina.  No frank syncope.     Labs independently reviewed: 07/2021 - potassium 4.3, BUN 18, serum creatinine 0.76, Hgb 11.7, PLT 314 03/2021 - albumin 4.3, AST/ALT normal 01/2021 - TC 110, TG 80, HDL 66, LDL 28, A1c 5.8, BNP 137 07/2020 - TSH normal   Past Medical History:  Diagnosis Date   Allergy    hay fever   Anemia    Anxiety    Back pain    Carotid artery occlusion    Cerebrovascular disease    extracranial; occlusive   Chicken pox    Coronary artery disease    Depression    Dizziness    Dizziness    DVT (deep venous thrombosis) (HCC)    Fainting spell    GERD (gastroesophageal reflux disease)    Headache    Heart murmur    Hyperlipidemia    Hypertension    Leg pain    PONV (postoperative nausea and vomiting)    severe nausea and vomiting    Pre-syncope    PVD (peripheral vascular disease) ([place])    endarterectomy by [name]   Seasonal allergies    Shortness of breath dyspnea    wth ambulation at times   Swelling of both ankles    and abdomen; takes Lasix when needed   Thrombophlebitis    following childbirth   Ulcer  Past Surgical History:  Procedure Laterality Date   APPENDECTOMY     BACK SURGERY  01/14/16   BREAST SURGERY     saline implants   CARDIAC CATHETERIZATION     CAROTID ENDARTERECTOMY  1992   CAROTID ENDARTERECTOMY Right February 13, 2005   Re-do Right CE   CATARACT EXTRACTION, BILATERAL  2013   CHOLECYSTECTOMY     CHOLECYSTECTOMY, LAPAROSCOPIC  04/02/14   Dr. Rochel Brome   CORONARY ARTERY BYPASS GRAFT  2006   x3 Dr. Prescott Gum   ENDARTERECTOMY Left 07/20/2014   Procedure: ENDARTERECTOMY CAROTID-LEFT;  Surgeon: Rosetta Posner, MD;  Location: [place];  Service: Vascular;  Laterality: Left;   EYE SURGERY Bilateral Feb. 2013   Cataract Left eye   SEPTOPLASTY     with bilateral inferior turbinate reductions   SPINE SURGERY  12/2015   TOTAL ABDOMINAL HYSTERECTOMY     UPPER GASTROINTESTINAL ENDOSCOPY     had polyps removed from esophagus   UPPER GI ENDOSCOPY      Current Medications: Current Meds  Medication Sig   aspirin 81 MG tablet Take 1 tablet (81 mg total) by mouth daily.   carvedilol (COREG) 6.25 MG tablet Take 1 tablet (6.25 mg total) by mouth 2 (two) times daily.   Coenzyme Q10 (CO Q10) 100 MG CAPS Take 100 mg by mouth daily.   ezetimibe (ZETIA) 10 MG tablet TAKE ONE TABLET EVERY DAY   furosemide (LASIX) 20 MG tablet Take 1 tablet (20mg ) Every Other Day alternating with 2 tablets (40mg ) Every Other Day   gabapentin (NEURONTIN) 400 MG capsule TAKE 1 CAPSULE BY MOUTH EVERY 8 HOURS   potassium chloride (KLOR-CON) 10 MEQ tablet Take 10 mEq by mouth daily.   rosuvastatin (CRESTOR) 20 MG tablet Take 1 tablet (20 mg total) by mouth daily.   sertraline (ZOLOFT) 100 MG tablet Take 1.5 tablets (150 mg  total) by mouth daily.   traMADol (ULTRAM) 50 MG tablet Take 1 tablet (50 mg total) by mouth in the morning.   vitamin B-12 (CYANOCOBALAMIN) 1000 MCG tablet Take 1 tablet (1,000 mcg total) by mouth daily.    Allergies:   Lyrica [pregabalin], Hydrocodone, Ivp dye [iodinated contrast media], Lipitor [atorvastatin], Oxycodone hcl, Penicillin g, Shellfish allergy, Clavulanic acid, Prednisone, Augmentin [amoxicillin-pot clavulanate], Codeine, Cymbalta [duloxetine hcl], Demerol, Doxycycline, Effexor [venlafaxine], Hydrochlorothiazide, Hydroxyzine, Metoprolol, Protonix [pantoprazole sodium], and Trazodone and nefazodone   Social History   Socioeconomic History   Marital status: Married    Spouse name: Jeneen Rinks    Number of children: 1   Years of education: Not on file   Highest education level: 11th grade  Occupational History    Comment: Retired   Tobacco Use   Smoking status: Former    Packs/day: 0.30    Years: 15.00    Pack years: 4.50    Types: Cigarettes    Quit date: 08/31/1996    Years since quitting: 25.0   Smokeless tobacco: Never  Vaping Use   Vaping Use: Never used  Substance and Sexual Activity   Alcohol use: No    Alcohol/week: 0.0 standard drinks   Drug use: No   Sexual activity: Not Currently  Other Topics Concern   Not on file  Social History Narrative   Lives with husband   Work - retired Emergency planning/management officer   Diet - healthy   Right handed    Caffeine- 1 cup per day.   Social Determinants of Health   Financial Resource Strain: Low Risk  Difficulty of Paying Living Expenses: Not very hard  Food Insecurity: Not on file  Transportation Needs: Not on file  Physical Activity: Not on file  Stress: Not on file  Social Connections: Not on file     Family History:  The patient's family history includes Aneurysm in her brother and mother; Birth defects in her brother; Bleeding Disorder in her mother and sister; Cancer in her sister; Cirrhosis in her father; Deep vein  thrombosis in her brother; Heart attack in her father; Heart disease in her brother, father, mother, and sister; Hyperlipidemia in her brother, daughter, father, mother, and sister; Hypertension in her brother, daughter, father, mother, sister, and another family member; Peripheral vascular disease in her daughter; Varicose Veins in her mother and sister. There is no history of Breast cancer or Colon cancer.  ROS:   Review of Systems  Constitutional:  Positive for malaise/fatigue and weight loss. Negative for chills, diaphoresis and fever.  HENT:  Negative for congestion.   Eyes:  Negative for discharge and redness.  Respiratory:  Negative for cough, sputum production, shortness of breath and wheezing.   Cardiovascular:  Positive for chest pain. Negative for palpitations, orthopnea, claudication, leg swelling and PND.  Gastrointestinal:  Negative for abdominal pain, blood in stool, heartburn, melena, nausea and vomiting.  Musculoskeletal:  Negative for falls and myalgias.  Skin:  Negative for rash.  Neurological:  Positive for weakness. Negative for dizziness, tingling, tremors, sensory change, speech change, focal weakness and loss of consciousness.  Endo/Heme/Allergies:  Does not bruise/bleed easily.  Psychiatric/Behavioral:  Negative for substance abuse. The patient is nervous/anxious.   All other systems reviewed and are negative.   EKGs/Labs/Other Studies Reviewed:    Studies reviewed were summarized above. The additional studies were reviewed today:  2D echo 11/2019: 1. Left ventricular ejection fraction, by estimation, is 65 to 70%. The  left ventricle has normal function. Left ventricular endocardial border  not optimally defined to evaluate regional wall motion. Left ventricular  diastolic parameters are consistent  with Grade III diastolic dysfunction (restrictive). Elevated left atrial  pressure.   2. Right ventricular systolic function is normal. The right ventricular  size  is normal. Tricuspid regurgitation signal is inadequate for assessing  PA pressure.   3. The mitral valve is grossly normal. Trivial mitral valve  regurgitation.   4. The aortic valve was not well visualized. Aortic valve regurgitation  is not visualized. No aortic stenosis is present.   5. The inferior vena cava is normal in size with greater than 50%  respiratory variability, suggesting right atrial pressure of 3 mmHg. __________   Carlton Adam MPI 02/2020: Pharmacological myocardial perfusion imaging study with no significant  ischemia Normal wall motion, EF estimated at 76% No EKG changes concerning for ischemia at peak stress or in recovery. Resting EKG with PVCs Attenuation correction images with aortic atherosclerosis and coronary calcification Low risk scan ___________   2D echo 01/10/2021:  1. Left ventricular ejection fraction, by estimation, is 60 to 65%. The  left ventricle has normal function. The left ventricle has no regional  wall motion abnormalities. Left ventricular diastolic parameters are  indeterminate.   2. Right ventricular systolic function is normal. The right ventricular  size is normal. Tricuspid regurgitation signal is inadequate for assessing  PA pressure.   3. The mitral valve was not well visualized. Moderate mitral valve  regurgitation.  __________   Elwyn Reach patch 12/2020: Predominant underlying rhythm was Sinus Rhythm.  Patient had a min HR of 48  bpm, max HR of 135 bpm, and avg HR of 62 bpm.   3 Supraventricular Tachycardia/atrial tachycardia runs occurred, the run with the fastest interval lasting 9 beats with a max rate of 135 bpm, the  longest lasting 14.0 secs with an avg rate of 102 bpm.     Isolated SVEs were rare (<1.0%), SVE Couplets were rare (<1.0%), and SVE Triplets were rare  (<1.0%). Isolated VEs were frequent (13.4%, 115534), VE Couplets were rare (<1.0%, 84), and VE Triplets were rare (<1.0%, 1).  Ventricular Bigeminy and Trigeminy were  present.   No patient triggered events noted __________  2D echo 09/15/2021: 1. Left ventricular ejection fraction, by estimation, is 60 to 65%. The  left ventricle has normal function. The left ventricle has no regional  wall motion abnormalities. Left ventricular diastolic parameters are  consistent with Grade II diastolic  dysfunction (pseudonormalization).   2. Right ventricular systolic function is normal. The right ventricular  size is normal. There is mildly elevated pulmonary artery systolic  pressure. The estimated right ventricular systolic pressure is 62.2 mmHg.   3. The mitral valve is normal in structure. Moderate mitral valve  regurgitation. No evidence of mitral stenosis.   4. The aortic valve is normal in structure. Aortic valve regurgitation is  not visualized. No aortic stenosis is present.   5. The inferior vena cava is normal in size with greater than 50%  respiratory variability, suggesting right atrial pressure of 3 mmHg.   Comparison(s): LVEF 60-65%, Moderate MR.    EKG:  EKG is ordered today.  The EKG ordered today demonstrates NSR, 64 bpm, baseline artifact, no acute ST-T changes  Recent Labs: 01/14/2021: BNP 137.4 04/14/2021: ALT 11 08/08/2021: BUN 18; Creatinine, Ser 0.76; Hemoglobin 11.7; Platelets 314; Potassium 4.3; Sodium 138  Recent Lipid Panel    Component Value Date/Time   CHOL 110 02/26/2021 1124   TRIG 80 02/26/2021 1124   HDL 66 02/26/2021 1124   CHOLHDL 1.7 02/26/2021 1124   CHOLHDL 2 02/01/2019 0907   VLDL 21.8 02/01/2019 0907   LDLCALC 28 02/26/2021 1124   LDLDIRECT 139.1 04/11/2012 1131    PHYSICAL EXAM:    VS:  BP (!) 160/60 (BP Location: Left Arm, Patient Position: Sitting, Cuff Size: Normal)    Pulse 64    Ht 5\' 2"  (1.575 m)    Wt 139 lb 8 oz (63.3 kg)    SpO2 98%    BMI 25.51 kg/m   BMI: Body mass index is 25.51 kg/m.  Physical Exam Vitals reviewed.  Constitutional:      Appearance: She is well-developed.  HENT:     Head:  Normocephalic and atraumatic.  Eyes:     General:        Right eye: No discharge.        Left eye: No discharge.  Neck:     Vascular: No JVD.  Cardiovascular:     Rate and Rhythm: Normal rate and regular rhythm.     Pulses:          Carotid pulses are  on the right side with bruit and  on the left side with bruit.      Posterior tibial pulses are 2+ on the right side and 2+ on the left side.     Heart sounds: Normal heart sounds, S1 normal and S2 normal. Heart sounds not distant. No midsystolic click and no opening snap. No murmur heard.   No friction rub.  Pulmonary:  Effort: Pulmonary effort is normal. No respiratory distress.     Breath sounds: Normal breath sounds. No decreased breath sounds, wheezing or rales.  Chest:     Chest wall: No tenderness.  Abdominal:     General: There is no distension.     Palpations: Abdomen is soft.     Tenderness: There is no abdominal tenderness.  Musculoskeletal:     Cervical back: Normal range of motion.     Right lower leg: No edema.     Left lower leg: No edema.  Skin:    General: Skin is warm and dry.     Nails: There is no clubbing.  Neurological:     Mental Status: She is alert and oriented to person, place, and time.  Psychiatric:        Speech: Speech normal.        Behavior: Behavior normal.        Thought Content: Thought content normal.        Judgment: Judgment normal.    Wt Readings from Last 3 Encounters:  09/25/21 139 lb 8 oz (63.3 kg)  09/09/21 141 lb 8 oz (64.2 kg)  08/08/21 140 lb (63.5 kg)    Orthostatic vital signs:  Lying: 172/67, 64 BP Sitting: 170/62, 61 bpm, lightheaded Standing: 129/63, 61 bpm Standing x3 minutes: 122/57, 62 bpm, nauseated   ASSESSMENT & PLAN:    HFpEF/pulmonary hypertension: She appears euvolemic and well compensated with stable weight.  She remains on furosemide 20 mg alternating with 40 mg every other day.  HTN with history of orthostatic hypotension: Decrease carvedilol to  3.125 mg twice daily.  She remains on furosemide 40 mg alternating with 20 mg on a daily basis.  Recommend compression stockings.  Continue adequate fluid intake.  May need to transition from knee-high compression stockings to thigh-high compression stockings.  Could also consider abdominal binder.  We will need to allow for some degree of permissive hypertension.  Some of her symptoms of orthostasis and generalized weakness are possibly in the context of her grief with poor p.o. intake.  CAD status post CABG other forms of angina: Currently chest pain-free.  Schedule Lexiscan MPI to evaluate for high risk ischemia.  Continue aggressive risk factor modification and secondary prevention including aspirin, carvedilol, rosuvastatin, and ezetimibe.  Frequent PVCs/atrial tachycardia: Quiescent.  We are decreasing carvedilol as outlined above secondary to orthostatic hypotension.  Mitral regurgitation: Stable moderate regurgitation on echo earlier this month.  Follow periodically.  HLD: LDL 28 in 01/2021.  She remains on rosuvastatin and ezetimibe.  Carotid artery disease: 1 to 39% bilateral ICA stenosis on imaging dated 09/09/2021.  She remains on statin and aspirin therapy.  Followed by vascular surgery.  Orthostatic hypotension: Decrease carvedilol to 3.125 mg twice daily.  She remains on furosemide 40 mg alternating with 20 mg on a daily basis.  Recommend compression stockings.  Continue adequate fluid intake.  May need to transition from knee-high compression stockings to thigh-high compression stockings.  Could also consider abdominal binder.   Shared Decision Making/Informed Consent{  The risks [chest pain, shortness of breath, cardiac arrhythmias, dizziness, blood pressure fluctuations, myocardial infarction, stroke/transient ischemic attack, nausea, vomiting, allergic reaction, radiation exposure, metallic taste sensation and life-threatening complications (estimated to be 1 in 10,000)], benefits  (risk stratification, diagnosing coronary artery disease, treatment guidance) and alternatives of a nuclear stress test were discussed in detail with Ms. Gunn and she agrees to proceed.     Disposition: F/u with Dr. Rockey Situ  or an APP in 2 months.   Medication Adjustments/Labs and Tests Ordered: Current medicines are reviewed at length with the patient today.  Concerns regarding medicines are outlined above. Medication changes, Labs and Tests ordered today are summarized above and listed in the Patient Instructions accessible in Encounters.   Signed, Christell Faith, PA-C 09/25/2021 3:14 PM     [address] [place], [place] [id number] [phone]

## 2021-09-25 ENCOUNTER — Encounter: Payer: Self-pay | Admitting: Physician Assistant

## 2021-09-25 ENCOUNTER — Ambulatory Visit: Payer: Medicare HMO | Admitting: Physician Assistant

## 2021-09-25 ENCOUNTER — Other Ambulatory Visit: Payer: Self-pay

## 2021-09-25 VITALS — BP 160/60 | HR 64 | Ht 62.0 in | Wt 139.5 lb

## 2021-09-25 DIAGNOSIS — I493 Ventricular premature depolarization: Secondary | ICD-10-CM | POA: Diagnosis not present

## 2021-09-25 DIAGNOSIS — I272 Pulmonary hypertension, unspecified: Secondary | ICD-10-CM

## 2021-09-25 DIAGNOSIS — I471 Supraventricular tachycardia: Secondary | ICD-10-CM

## 2021-09-25 DIAGNOSIS — I25118 Atherosclerotic heart disease of native coronary artery with other forms of angina pectoris: Secondary | ICD-10-CM | POA: Diagnosis not present

## 2021-09-25 DIAGNOSIS — I4719 Other supraventricular tachycardia: Secondary | ICD-10-CM

## 2021-09-25 DIAGNOSIS — I10 Essential (primary) hypertension: Secondary | ICD-10-CM

## 2021-09-25 DIAGNOSIS — Z951 Presence of aortocoronary bypass graft: Secondary | ICD-10-CM

## 2021-09-25 DIAGNOSIS — I5032 Chronic diastolic (congestive) heart failure: Secondary | ICD-10-CM

## 2021-09-25 DIAGNOSIS — R072 Precordial pain: Secondary | ICD-10-CM | POA: Diagnosis not present

## 2021-09-25 DIAGNOSIS — I340 Nonrheumatic mitral (valve) insufficiency: Secondary | ICD-10-CM | POA: Diagnosis not present

## 2021-09-25 DIAGNOSIS — I951 Orthostatic hypotension: Secondary | ICD-10-CM

## 2021-09-25 DIAGNOSIS — E785 Hyperlipidemia, unspecified: Secondary | ICD-10-CM

## 2021-09-25 DIAGNOSIS — I6523 Occlusion and stenosis of bilateral carotid arteries: Secondary | ICD-10-CM

## 2021-09-25 MED ORDER — CARVEDILOL 3.125 MG PO TABS
3.1250 mg | ORAL_TABLET | Freq: Two times a day (BID) | ORAL | 3 refills | Status: DC
Start: 2021-09-25 — End: 2021-11-19

## 2021-09-25 NOTE — Patient Instructions (Addendum)
Medication Instructions:  Your physician has recommended you make the following change in your medication:   DECREASE Carvedilol to 3.125 mg twice a day  *If you need a refill on your cardiac medications before your next appointment, please call your pharmacy*   Lab Work: None  If you have labs (blood work) drawn today and your tests are completely normal, you will receive your results only by: [employer] (if you have MyChart) OR A paper copy in the mail If you have any lab test that is abnormal or we need to change your treatment, we will call you to review the results.   Testing/Procedures: [place]  Your caregiver has ordered a Stress Test with nuclear imaging. The purpose of this test is to evaluate the blood supply to your heart muscle. This procedure is referred to as a "Non-Invasive Stress Test." This is because other than having an IV started in your vein, nothing is inserted or "invades" your body. Cardiac stress tests are done to find areas of poor blood flow to the heart by determining the extent of coronary artery disease (CAD). Some patients exercise on a treadmill, which naturally increases the blood flow to your heart, while others who are  unable to walk on a treadmill due to physical limitations have a pharmacologic/chemical stress agent called Lexiscan . This medicine will mimic walking on a treadmill by temporarily increasing your coronary blood flow.   Please note: these test may take anywhere between 2-4 hours to complete  PLEASE REPORT TO [facility] AT THE FIRST DESK WILL DIRECT YOU WHERE TO GO  Date of Procedure:______________________  Arrival Time for Procedure:___________________________   Instructions regarding medication:   __XX__:  Hold other medications as follows: Furosemide & Potassium the morning of your test. You may take this after your test has been done.  PLEASE NOTIFY THE OFFICE AT LEAST 40 HOURS IN  ADVANCE IF YOU ARE UNABLE TO KEEP YOUR APPOINTMENT.  [phone] AND  PLEASE NOTIFY NUCLEAR MEDICINE AT [facility] AT LEAST 24 HOURS IN ADVANCE IF YOU ARE UNABLE TO KEEP YOUR APPOINTMENT. [phone]  How to prepare for your Myoview test:  Do not eat or drink after midnight No caffeine for 24 hours prior to test No smoking 24 hours prior to test. Your medication may be taken with water.  If your doctor stopped a medication because of this test, do not take that medication. Ladies, please do not wear dresses.  Skirts or pants are appropriate. Please wear a short sleeve shirt. No perfume, cologne or lotion. Wear comfortable walking shoes. No heels!   Follow-Up: At [facility], you and your health needs are our priority.  As part of our continuing mission to provide you with exceptional heart care, we have created designated Provider Care Teams.  These Care Teams include your primary Cardiologist (physician) and Advanced Practice Providers (APPs -  Physician Assistants and Nurse Practitioners) who all work together to provide you with the care you need, when you need it.   Your next appointment:   2 month(s)  The format for your next appointment:   In Person  Provider:   Ida Rogue, MD or Christell Faith, PA-C

## 2021-10-01 ENCOUNTER — Other Ambulatory Visit: Payer: Self-pay

## 2021-10-01 ENCOUNTER — Encounter
Admission: RE | Admit: 2021-10-01 | Discharge: 2021-10-01 | Disposition: A | Discharge status: Home / Self Care | Payer: Medicare HMO | Source: Ambulatory Visit | Attending: Physician Assistant | Admitting: Physician Assistant

## 2021-10-01 DIAGNOSIS — R072 Precordial pain: Secondary | ICD-10-CM | POA: Diagnosis not present

## 2021-10-01 LAB — NM MYOCAR MULTI W/SPECT W/WALL MOTION / EF
LV dias vol: 52 mL (ref 46–106)
LV sys vol: 19 mL
Nuc Stress EF: 63 %
Peak HR: 81 {beats}/min
Percent HR: 58 %
Rest HR: 62 {beats}/min
Rest Nuclear Isotope Dose: 10.9 mCi
SDS: 0
SRS: 0
SSS: 0
ST Depression (mm): 0 mm
Stress Nuclear Isotope Dose: 30.1 mCi
TID: 1

## 2021-10-01 MED ORDER — TECHNETIUM TC 99M TETROFOSMIN IV KIT
30.1300 | PACK | Freq: Once | INTRAVENOUS | Status: AC | PRN
  Administered 2021-10-01: 30.13 via INTRAVENOUS

## 2021-10-01 MED ORDER — REGADENOSON 0.4 MG/5ML IV SOLN
0.4000 mg | Freq: Once | INTRAVENOUS | Status: AC
  Administered 2021-10-01: 0.4 mg via INTRAVENOUS

## 2021-10-01 MED ORDER — TECHNETIUM TC 99M TETROFOSMIN IV KIT
10.9000 | PACK | Freq: Once | INTRAVENOUS | Status: AC | PRN
  Administered 2021-10-01: 10.9 via INTRAVENOUS

## 2021-10-16 ENCOUNTER — Other Ambulatory Visit (HOSPITAL_BASED_OUTPATIENT_CLINIC_OR_DEPARTMENT_OTHER): Payer: Self-pay

## 2021-10-16 ENCOUNTER — Ambulatory Visit: Payer: Medicare HMO | Attending: Internal Medicine

## 2021-10-16 DIAGNOSIS — Z23 Encounter for immunization: Secondary | ICD-10-CM

## 2021-10-16 MED ORDER — PFIZER COVID-19 VAC BIVALENT 30 MCG/0.3ML IM SUSP
INTRAMUSCULAR | 0 refills | Status: DC
  Filled 2021-10-16: qty 0.3, 1d supply, fill #0

## 2021-10-16 NOTE — Progress Notes (Signed)
° °  Covid-19 Vaccination Clinic  Name:  Catherine Green    MRN: [id number] DOB: 1941-06-08  10/16/2021  [name] was observed post Covid-19 immunization for 15 minutes without incident. She was provided with Vaccine Information Sheet and instruction to access the V-Safe system.   [name] was instructed to call 911 with any severe reactions post vaccine: Difficulty breathing  Swelling of face and throat  A fast heartbeat  A bad rash all over body  Dizziness and weakness   Immunizations Administered     Name Date Dose VIS Date Route   Pfizer Covid-19 Vaccine Bivalent Booster 10/16/2021 10:15 AM 0.3 mL 04/30/2021 Intramuscular   Manufacturer: [place]   Lot: [id number]   [place]: [phone]

## 2021-10-30 DIAGNOSIS — D1721 Benign lipomatous neoplasm of skin and subcutaneous tissue of right arm: Secondary | ICD-10-CM | POA: Diagnosis not present

## 2021-10-30 DIAGNOSIS — D1722 Benign lipomatous neoplasm of skin and subcutaneous tissue of left arm: Secondary | ICD-10-CM | POA: Diagnosis not present

## 2021-10-30 DIAGNOSIS — L821 Other seborrheic keratosis: Secondary | ICD-10-CM | POA: Diagnosis not present

## 2021-10-30 DIAGNOSIS — L570 Actinic keratosis: Secondary | ICD-10-CM | POA: Diagnosis not present

## 2021-10-30 DIAGNOSIS — D2262 Melanocytic nevi of left upper limb, including shoulder: Secondary | ICD-10-CM | POA: Diagnosis not present

## 2021-10-30 DIAGNOSIS — C44712 Basal cell carcinoma of skin of right lower limb, including hip: Secondary | ICD-10-CM | POA: Diagnosis not present

## 2021-10-30 DIAGNOSIS — C44519 Basal cell carcinoma of skin of other part of trunk: Secondary | ICD-10-CM | POA: Diagnosis not present

## 2021-11-11 ENCOUNTER — Other Ambulatory Visit: Payer: Self-pay | Admitting: Physician Assistant

## 2021-11-17 NOTE — Progress Notes (Signed)
? ?Cardiology Office Note   ? ?Date:  11/19/2021  ? ?ID:  Catherine Green, DOB 05-Oct-1940, MRN [id number] ? ?PCP:  Tonia Ghent, MD  ?Cardiologist:  Ida Rogue, MD  ?Electrophysiologist:  None  ? ?Chief Complaint: Follow-up ? ?History of Present Illness:  ? ?Catherine Green is a 81 y.o. female with history of CAD status post three-vessel CABG in 2006, HFpEF, bilateral carotid artery disease status post bilateral CEA followed by vascular surgery, HTN, HLD, frequent PVCs, atrial tachycardia, multiple medication intolerances, back pain status post surgery, prior tobacco use starting at age 42 and quitting at age 43 who presents for follow-up of Lexiscan MPI. ?  ?Most recent cardiac cath from 09/2007 demonstrated an atretic LIMA to LAD with patent vein grafts. ?  ?She was seen at her PCPs office in 10/2019 with dyspnea.  BNP mildly elevated at 300.  Her symptoms were felt to be likely multifactorial including volume overload and mild longstanding anemia.  She was started on Lasix 40 mg daily.  Echo in 11/2019 showed a hyperdynamic LVSF with an EF of 65 to 58%, grade 3 diastolic dysfunction, normal RV systolic function and RV cavity size, and trivial mitral regurgitation.  Unable to estimate right heart pressures, IVC not documented as being dilated.  Small pleural effusion was noted.  Following this echo she was taking Lasix 40 mg twice daily with continued swelling.  Recheck at her PCPs office in late 12/2019 demonstrated her weight was down 6 pounds, however she continued to note bilateral pedal edema and abdominal bloating as the day progressed.  She continued to have some occasional dyspnea in the afternoons.  Her appetite remained down.  She was seen in the office on 02/06/2020 noting increased dyspnea, PND, 2-3 pillow orthopnea, and increased fatigue.  BP was elevated in the 832P to 498Y systolic.  Her weight was down 8 pounds in the past 3 months with notes indicating she was missing meals.  She reported nausea  when eating with associated dysphagia.  Repeat BNP mildly improved at 239 with stable renal function.  She was taking Lasix 40 mg daily.  It was felt there was some component of venous insufficiency with her lower extremity swelling.  It was recommended she increase her Lasix to 40 mg twice daily for 1 week followed by 40 mg daily thereafter.  She was seen in 02/2020 noting improvement in her dyspnea following diuresis and had continued to take Lasix 40 mg twice daily since her visit in 01/2020.  Her weight is down another 6 pounds when compared to her last clinic visit.  She did note some positional dizziness and generalized fatigue.  She was orthostatic in the office that day.  Lasix was decreased to 40 mg daily.  Symptoms felt similar to her presentation leading up to her bypass.  In this setting, she underwent Lexiscan MPI on 03/15/2020 which showed no significant ischemia, normal wall motion, EF 76%, attenuation corrected images with aortic atherosclerosis and coronary artery calcifications, and was overall a low risk scan.  EKG showed PVCs.  CTA of the neck from 04/23/2020 showed no dissection or pseudoaneurysm without interval growth of remodeling/fusiform aneurysm within the right internal carotid artery, 30% left ICA stenosis, and 70% stenosis of the brachiocephalic origin.  There was 40% subclavian artery stenosis.  She was seen in the office on 04/29/2020 noting her dizziness persisted.  Her weight was stable.  She had further decreased her Lasix to 20 mg daily in the setting  of ongoing dizziness.  She denied any shortness of breath or lower extremity swelling.  Orthostatic vital signs remained positive at that visit.  Imdur was discontinued and carvedilol was decreased to 3.125 mg twice daily.  Following this, she contacted our office in 05/2020, noting continued orthostasis, and in this setting carvedilol was held.  She was seen in the office on 05/29/2020, and continued to note positional dizziness.  She was  no longer on carvedilol or Imdur though was taking Lasix 40 mg daily.  Home BP readings were predominantly in the 637C to 588F systolic.  Her dizziness was noted to date back at least to 02/2018.  She declined head CT.  She was advised to follow-up with her PCP.  It was again recommended her Lasix be decreased to 20 mg daily.  She was hypertensive when lying for orthostatic BP with orthostatics continuing to be mildly positive.  Renal artery ultrasound showed no evidence of renal artery stenosis.  She was seen in the office in 07/2020 noting some improvement in her chronic dizziness.  BP readings ranged from the 027X to 412 systolic with most heart rates in the 60s bpm. Carotid artery ultrasound in 07/2020, showed 1-39% bilateral ICA stenosis, antegrade flow along the vertebral arteries bilaterally, normal flow hemodynamics in the bilateral subclavian arteries. Bilateral ABI's were normal in 10/2020.  ?  ?She was seen in 10/2020 and was doing well from a cardiac perspective.  She did continue to note a significant improvement in her chronic dizziness when having permissive BP.  Home BP readings were in the 150s to 160s for the most part with occasional readings in the 878M systolic.  She was frequently taking prn Coreg, and in this setting, it was recommended she take Coreg 3.125 mg bid.  ?  ?She was seen on 01/08/2021 noting a several week history of elevated BP readings, particularly in the morning with many readings in the 160s to 767M mmHg systolic.  ReDs vest was elevated at 39%.  It was recommended she start amlodipine 5 mg nightly and continue carvedilol 3.125 mg twice daily.  Her Lasix was titrated to 40 mg daily for 3 days.  Labs obtained at that time showed a BNP of 163.  She underwent echo on 01/10/2021 which showed an EF of 60 to 65%, no regional wall motion abnormalities, indeterminate LV diastolic function parameters, normal RV systolic function and ventricular cavity size, and moderate mitral  regurgitation.  She was seen in the office on 01/14/2021 with stable weight and BP of 170/64 in the office.  She continued to note unchanged longstanding lightheadedness and dizziness.  She remained very anxious.  Her shortness of breath did improve some following increased diuresis.  She did note some palpitations.  Given prior noted orthostatic hypotension permissive hypertension was recommended and she was continued on carvedilol and amlodipine.  Lasix was adjusted to 40 mg alternating with 20 mg every other day.  She underwent Zio patch which showed a predominant rhythm of sinus with an average heart rate of 62 bpm (range 48 to 135 bpm), 3 runs of SVT/atrial tachycardia were noted with the fastest interval lasting 9 beats with a maximal rate of 135 bpm and the longest interval lasting 14 seconds with an average rate of 102 bpm.  There were frequent PVCs representing a 13.4% burden.  No patient triggered events were observed.  Given this, it was recommended amlodipine be discontinued and carvedilol be titrated to 6.25 mg twice daily to improve with PVC  burden. ?  ?She was seen in the office in 01/2021, by her primary cardiologist, and noted rare dizziness along with increased stress.  Weight was stable.  No changes were made. ?  ?She was seen in the office on 08/08/2021 and was grieving the loss of her sister, daughter, and granddaughter.  With this, she noted intermittent episodes of shortness of breath, particularly when upset.  With her grief, she had not had much of an appetite.  Her weight was down 5 pounds when compared to her prior clinic visit.  She had been started on Zoloft by her PCP, though had not noted an improvement in her grief.  She was without symptoms of angina or decompensation.  She underwent echo on 09/15/2021 which demonstrated an EF of 60 to 65%, no regional wall motion abnormalities, grade 2 diastolic dysfunction, normal RV systolic function and ventricular cavity size, mildly elevated PASP  estimated at 43.7 mmHg, moderate mitral regurgitation, and an estimated right atrial pressure of 3 mmHg. ? ?She was last seen in the office on 09/25/2021 and continued to feel poorly.  She noted intermittent substernal

## 2021-11-19 ENCOUNTER — Encounter: Payer: Self-pay | Admitting: Physician Assistant

## 2021-11-19 ENCOUNTER — Other Ambulatory Visit: Payer: Self-pay

## 2021-11-19 ENCOUNTER — Ambulatory Visit: Payer: Medicare HMO | Admitting: Physician Assistant

## 2021-11-19 VITALS — BP 166/76 | HR 64 | Ht 62.0 in | Wt 144.4 lb

## 2021-11-19 DIAGNOSIS — R5383 Other fatigue: Secondary | ICD-10-CM

## 2021-11-19 DIAGNOSIS — I493 Ventricular premature depolarization: Secondary | ICD-10-CM

## 2021-11-19 DIAGNOSIS — I340 Nonrheumatic mitral (valve) insufficiency: Secondary | ICD-10-CM

## 2021-11-19 DIAGNOSIS — I25118 Atherosclerotic heart disease of native coronary artery with other forms of angina pectoris: Secondary | ICD-10-CM | POA: Diagnosis not present

## 2021-11-19 DIAGNOSIS — I5032 Chronic diastolic (congestive) heart failure: Secondary | ICD-10-CM

## 2021-11-19 DIAGNOSIS — I10 Essential (primary) hypertension: Secondary | ICD-10-CM

## 2021-11-19 DIAGNOSIS — I4719 Other supraventricular tachycardia: Secondary | ICD-10-CM

## 2021-11-19 DIAGNOSIS — I272 Pulmonary hypertension, unspecified: Secondary | ICD-10-CM | POA: Diagnosis not present

## 2021-11-19 DIAGNOSIS — Z951 Presence of aortocoronary bypass graft: Secondary | ICD-10-CM

## 2021-11-19 DIAGNOSIS — I6523 Occlusion and stenosis of bilateral carotid arteries: Secondary | ICD-10-CM

## 2021-11-19 DIAGNOSIS — I471 Supraventricular tachycardia: Secondary | ICD-10-CM

## 2021-11-19 DIAGNOSIS — R42 Dizziness and giddiness: Secondary | ICD-10-CM

## 2021-11-19 DIAGNOSIS — E785 Hyperlipidemia, unspecified: Secondary | ICD-10-CM

## 2021-11-19 DIAGNOSIS — I951 Orthostatic hypotension: Secondary | ICD-10-CM | POA: Diagnosis not present

## 2021-11-19 MED ORDER — FUROSEMIDE 20 MG PO TABS: 20.0000 mg | ORAL_TABLET | Freq: Every day | ORAL | 0 refills | Status: DC

## 2021-11-19 NOTE — Patient Instructions (Addendum)
Medication Instructions:  ? ?STOP - CARVEDILOL 3.125 MG TABLET ? ?DECREASE FUROSEMIDE to Furosemide 20 mg TABLET DAILY. ? ?*If you need a refill on your cardiac medications before your next appointment, please call your pharmacy* ? ? ?Lab Work: ?NONE ?If you have labs (blood work) drawn today and your tests are completely normal, you will receive your results only by: ?MyChart Message (if you have MyChart) OR ?A paper copy in the mail ?If you have any lab test that is abnormal or we need to change your treatment, we will call you to review the results. ? ? ?Testing/Procedures: ?NONE ? ? ?Follow-Up: ?At [facility], you and your health needs are our priority.  As part of our continuing mission to provide you with exceptional heart care, we have created designated Provider Care Teams.  These Care Teams include your primary Cardiologist (physician) and Advanced Practice Providers (APPs -  Physician Assistants and Nurse Practitioners) who all work together to provide you with the care you need, when you need it. ? ?We recommend signing up for the patient portal called "MyChart".  Sign up information is provided on this After Visit Summary.  MyChart is used to connect with patients for Virtual Visits (Telemedicine).  Patients are able to view lab/test results, encounter notes, upcoming appointments, etc.  Non-urgent messages can be sent to your provider as well.   ?To learn more about what you can do with MyChart, go to [email or web address].   ? ?Your next appointment:   ?2 month(s) ? ?The format for your next appointment:   ?In Person ? ?Provider:   ?You may see Ida Rogue, MD or one of the following Advanced Practice Providers on your designated Care Team:   ?Murray Hodgkins, NP ?Christell Faith, PA-C ?Cadence Kathlen Mody, PA-C{ ? ? ?

## 2021-11-21 ENCOUNTER — Telehealth: Payer: Self-pay | Admitting: Physician Assistant

## 2021-11-21 MED ORDER — HYDRALAZINE HCL 25 MG PO TABS: 25.0000 mg | ORAL_TABLET | Freq: Three times a day (TID) | ORAL | 3 refills | Status: DC

## 2021-11-21 NOTE — Telephone Encounter (Signed)
Spoke with patient and reviewed her concerns. She states that she was very worried about the high readings she developed. She restarted carvedilol because of those high readings but she is worried about falling. We discussed recommendations in detail and she would like to try the hydralazine instead to see if she tolerates that better. Expressed importance of not taking both medications and to stop the carvedilol. Instructed her to check blood pressures 2 hours after medications and to please call us back if they persistently remain elevated. She verbalized understanding of our conversation, recommendations, and had no further questions at this time.  ?

## 2021-11-21 NOTE — Telephone Encounter (Signed)
Pt c/o BP issue: STAT if pt c/o blurred vision, one-sided weakness or slurred speech  1. What are your last 5 BP readings?  This morning 176/72 HR 63  Later this morning it was over 200, but she did not record it, she was fearful.  Yesterday 183/72 HR 62  2. Are you having any other symptoms (ex. Dizziness, headache, blurred vision, passed out)? "Head is killing me".   3. What is your BP issue? Too high Started taking her BP medication again. States Catherine Green, [place] had taken her off

## 2021-11-21 NOTE — Telephone Encounter (Signed)
Blood pressure elevated off carvedilol.  I suspected this would be the case, which is why I recommended replacing this with a medication such as hydralazine at her visit when we discontinued beta-blocker, though she declined.  We have 2 options; she can resume carvedilol 3.125 mg twice daily as I have low suspicion this is contributing to her symptoms given they have been ongoing for many years and have persisted despite medication changes.  Alternatively, we can place her on hydralazine 25 mg 3 times daily if she would like. ?

## 2021-11-24 ENCOUNTER — Ambulatory Visit: Payer: Medicare HMO | Admitting: Cardiovascular Disease

## 2021-11-24 ENCOUNTER — Telehealth: Payer: Self-pay

## 2021-11-24 NOTE — Progress Notes (Signed)
? ? ?Chronic Care Management ?Pharmacy Assistant  ? ?Name: Catherine Green  MRN: [id number] DOB: 31-Jul-1941 ? ?Reason for Encounter: CCM (Hypertension Disease State) ?  ?Recent office visits:  ?10/16/2021 Covid 19 Vaccine ? ?Recent consult visits:  ?11/21/2021 Christell Faith, PA (Cardiology) Telephone: Stop: Carvedilol due to patient concerns. Start: Hydralazine HCI 25 mg.  ?11/19/2021 Christell Faith, PA (Cardiology): CHF Change (decrease dose due to orthostatics): Furosemide 20 mg. Stop (patient request): Carvedilol.  ?10/01/2021 Christell Faith, PA NM MYO MULT SPECT W/WALL 1Day ?09/25/2021 Christell Faith, PA (Cardiology) Coronary artery disease Ordered: NM Myocar Multi and EKG. Change (dose): Carvedilol: 3.125 mg ?09/15/2021 Ida Rogue (Cardiology): Shortness of breath Procedure: Echo Heart and Xthoracic.  ?09/11/2021 Harriett Sine (Dermatology) Seborrheic Dermatitis No other info.  ?09/09/2021 Leontine Locket, PA (Vascular Surgery): Carotid Stenosis, Bilateral F/U 1 year ? ?Hospital visits:  ?None in previous 6 months ? ?Medications: ?Outpatient Encounter Medications as of 11/24/2021  ?Medication Sig  ? aspirin 81 MG tablet Take 1 tablet (81 mg total) by mouth daily.  ? Coenzyme Q10 (CO Q10) 100 MG CAPS Take 100 mg by mouth daily.  ? COVID-19 mRNA bivalent vaccine, Pfizer, (PFIZER COVID-19 VAC BIVALENT) injection Inject into the muscle.  ? ezetimibe (ZETIA) 10 MG tablet TAKE ONE TABLET EVERY DAY  ? furosemide (LASIX) 20 MG tablet Take 1 tablet (20 mg total) by mouth daily.  ? gabapentin (NEURONTIN) 400 MG capsule TAKE 1 CAPSULE BY MOUTH EVERY 8 HOURS  ? hydrALAZINE (APRESOLINE) 25 MG tablet Take 1 tablet (25 mg total) by mouth 3 (three) times daily.  ? omeprazole (PRILOSEC) 20 MG capsule Take 1 capsule (20 mg total) by mouth daily.  ? potassium chloride (KLOR-CON) 10 MEQ tablet Take 10 mEq by mouth daily.  ? rosuvastatin (CRESTOR) 20 MG tablet TAKE ONE TABLET BY MOUTH DAILY  ? sertraline (ZOLOFT) 100 MG tablet Take 1.5 tablets  (150 mg total) by mouth daily.  ? traMADol (ULTRAM) 50 MG tablet Take 1 tablet (50 mg total) by mouth in the morning.  ? vitamin B-12 (CYANOCOBALAMIN) 1000 MCG tablet Take 1 tablet (1,000 mcg total) by mouth daily.  ? ?No facility-administered encounter medications on file as of 11/24/2021.  ? ?Recent Office Vitals: ?BP Readings from Last 3 Encounters:  ?11/19/21 (!) 166/76  ?09/25/21 (!) 160/60  ?09/09/21 (!) 146/65  ? ?Pulse Readings from Last 3 Encounters:  ?11/19/21 64  ?09/25/21 64  ?09/09/21 (!) 58  ?  ?Wt Readings from Last 3 Encounters:  ?11/19/21 144 lb 6 oz (65.5 kg)  ?09/25/21 139 lb 8 oz (63.3 kg)  ?09/09/21 141 lb 8 oz (64.2 kg)  ?  ?Kidney Function ?Lab Results  ?Component Value Date/Time  ? CREATININE 0.76 08/08/2021 03:42 PM  ? CREATININE 0.79 04/14/2021 11:29 AM  ? CREATININE 0.81 08/04/2017 04:49 PM  ? GFR 70.32 04/14/2021 11:29 AM  ? GFRNONAA >60 08/08/2021 03:42 PM  ? GFRAA 73 07/29/2020 11:13 AM  ? ? ?  Latest Ref Rng & Units 08/08/2021  ?  3:42 PM 04/14/2021  ? 11:29 AM 01/21/2021  ? 10:32 AM  ?BMP  ?Glucose 70 - 99 mg/dL 107   98   94    ?BUN 8 - 23 mg/dL '18   17   13    '$ ?Creatinine 0.44 - 1.00 mg/dL 0.76   0.79   0.85    ?BUN/Creat Ratio 12 - 28   15    ?Sodium 135 - 145 mmol/L 138   141  140    ?Potassium 3.5 - 5.1 mmol/L 4.3   3.5   4.1    ?Chloride 98 - 111 mmol/L 103   101   97    ?CO2 22 - 32 mmol/L '29   31   27    '$ ?Calcium 8.9 - 10.3 mg/dL 9.3   9.0   9.5    ? ?Contacted patient on 11/25/2021 to discuss hypertension disease state ? ?Current antihypertensive regimen:  ?Stop: Carvedilol due to patient concerns on 11/21/2021 ?Start: Hydralazine HCI 25 mg 1 tablet 3 times daily - Patient stated she is taking it 2 times a day instead of three. Patient takes it around 8:30 am and around 6:30 pm. ? Patient verbally confirms she is taking the above medications as directed. No ? ?How often are you checking your Blood Pressure? daily ? ?Date Time Blood  Pressure Pulse ?03/28 AM 159/62   73 ? Lunch 152/56   64 ? PM 139/60   62  ?03/27 AM 148/68   58 ? Lunch 127/65   67 ? PM 127/55   74 ?03/26 AM 155/61   64 ? Lunch 131/70   68 ? ------ --------   --- ?03/25 AM 135/53   60 ? Lunch 134/63   60 ? ------ --------   ---- ?Readings below are before medication changes:  ?03/24 AM 176/72   63 ? Lunch 176/93   66 ? ------- --------   ---- ?03/23 AM 180/72   64  ? ?she checks her blood pressure multiple times a day. ?Wrist or arm cuff: Arm cuff ?Caffeine intake: Patient drinks 1 cup of coffee in the morning ?Salt intake: Patient limits salt ?Over the counter medications including pseudoephedrine or NSAIDs? 1 low dose aspirin daily ? ?Any readings above 180/120? No ? ?What recent interventions/DTPs have been made by any provider to improve Blood Pressure control since last CPP Visit:  Monitor blood pressure at home weekly and continue current medication.  ? ?Any recent hospitalizations or ED visits since last visit with CPP? No ? ?What diet changes have been made to improve Blood Pressure Control?  ?Patient does not have any diet changes.  ? ?What exercise is being done to improve your Blood Pressure Control?  ?Patient exercising a little bit as much as she can; her legs will give out from under her.  ? ?Adherence Review: ?Is the patient currently on ACE/ARB medication? No ?Does the patient have >5 day gap between last estimated fill dates? No ? ?Star Rating Drugs:  ?Medication:  Last Fill: Day Supply ?Rosuvastatin 20 mg 11/11/2021 90 ? ?Care Gaps: ?Annual wellness visit in last year? Yes 05/10/2021 ?Most Recent BP reading: 166/76 on 11/19/2021 ? ?Upcoming appointments: ?CCM appointment on 02/18/2022 ? ?Charlene Brooke, CPP notified ? ?Marijean Niemann, RMA ?Clinical Pharmacy Assistant ?[phone] ? ? ? ? ?

## 2021-12-04 ENCOUNTER — Telehealth: Payer: Self-pay | Admitting: Physician Assistant

## 2021-12-04 MED ORDER — AMLODIPINE BESYLATE 2.5 MG PO TABS: 2.5000 mg | ORAL_TABLET | Freq: Every day | ORAL | 3 refills | Status: DC

## 2021-12-04 NOTE — Telephone Encounter (Signed)
Pt c/o BP issue: STAT if pt c/o blurred vision, one-sided weakness or slurred speech ? ?1. What are your last 5 BP readings? 128/63; 160/74; 157/63j; 168/67; 183/74 ? ? ?2. Are you having any other symptoms (ex. Dizziness, headache, blurred vision, passed out)? Headache, nausea, not feeling well ? ?3. What is your BP issue? Patient is having side effects from taking hydrALAZINE (APRESOLINE) 25 MG tablet  ?

## 2021-12-04 NOTE — Telephone Encounter (Signed)
Reviewed recommendations with patient and she was agreeable with this plan. She verbalized understanding of our conversation, agreement with plan, and had no further questions at this time.  ?

## 2021-12-04 NOTE — Telephone Encounter (Signed)
Pt states "I felt fine the first day but after that it has just been down hill. My husband told me I needed it to get in my sytem." Pt c/o no appetite, nausea, vomiting and over all not feeling well. "I take that medicine 3 times a day and every time I take it I just feel so bad afterwards." Will get message to Christell Faith for review.  ?

## 2021-12-04 NOTE — Telephone Encounter (Signed)
Continued management of her hypertension will be quite difficult secondary to numerous medication intolerances.  She was previously on amlodipine and it appears she may have tolerated this medication with it being discontinued in the context of need to titrate carvedilol due to PVCs. ? ?Recommendations: ?-Discontinue hydralazine ?-Start amlodipine 2.5 mg nightly ?

## 2021-12-16 ENCOUNTER — Other Ambulatory Visit: Payer: Self-pay | Admitting: Family

## 2021-12-16 NOTE — Telephone Encounter (Signed)
Rx(s) sent to pharmacy electronically.  

## 2021-12-29 ENCOUNTER — Ambulatory Visit: Payer: Medicare HMO | Admitting: Family Medicine

## 2022-01-02 ENCOUNTER — Ambulatory Visit (INDEPENDENT_AMBULATORY_CARE_PROVIDER_SITE_OTHER): Payer: Medicare HMO | Admitting: Family Medicine

## 2022-01-02 ENCOUNTER — Encounter: Payer: Self-pay | Admitting: Family Medicine

## 2022-01-02 DIAGNOSIS — F419 Anxiety disorder, unspecified: Secondary | ICD-10-CM

## 2022-01-02 DIAGNOSIS — I10 Essential (primary) hypertension: Secondary | ICD-10-CM | POA: Diagnosis not present

## 2022-01-02 DIAGNOSIS — F32A Depression, unspecified: Secondary | ICD-10-CM | POA: Diagnosis not present

## 2022-01-02 DIAGNOSIS — R69 Illness, unspecified: Secondary | ICD-10-CM | POA: Diagnosis not present

## 2022-01-02 MED ORDER — AMLODIPINE BESYLATE 2.5 MG PO TABS: ORAL_TABLET | ORAL | Status: DC

## 2022-01-02 MED ORDER — BUSPIRONE HCL 5 MG PO TABS: 5.0000 mg | ORAL_TABLET | Freq: Two times a day (BID) | ORAL | 1 refills | Status: DC

## 2022-01-02 MED ORDER — OMEPRAZOLE 20 MG PO CPDR: 20.0000 mg | DELAYED_RELEASE_CAPSULE | Freq: Every day | ORAL | 3 refills | Status: DC

## 2022-01-02 NOTE — Progress Notes (Signed)
GERD controlled with omeprazole w/o ADE.  Refill sent.   ? ?No recent tramadol use.   ? ?Mood d/w pt.  Had a second sister die, along with her granddaughter.  Mood is clearly lower.  Still taking sertraline '150mg'$  a day.  Discussed options.   ? ?Leg sx.  She'll feel tremulous and then her legs will feel weak.  She can feel lightheaded when it happens.  She feels better when sitting.   ? ?R shin 1cm x 1cm well demarcated biopsy site that is darker than surrounding tissue.  She was told the skin sample was not cancerous.   ? ?Meds, vitals, and allergies reviewed.  ? ?ROS: Per HPI unless specifically indicated in ROS section  ? ?GEN: nad, alert and oriented, tearful but regains composure. ?HEENT: ncat ?NECK: supple w/o LA ?CV: rrr.  ?PULM: ctab, no inc wob ?ABD: soft, +bs ?EXT: no edema ?SKIN: no acute rash but R shin 1cm x 1cm well demarcated biopsy site that is darker than surrounding tissue (discussed that this appears to be normal healing and should gradually fade and but she can have postinflammatory/postbiopsy hyperpigmentation.) ?Normal S/S BLE edema.   ? ?30 minutes were devoted to patient care in this encounter (this includes time spent reviewing the patient's file/history, interviewing and examining the patient, counseling/reviewing plan with patient).  ? ?

## 2022-01-02 NOTE — Patient Instructions (Addendum)
The skin spot should gradually fade in.   ?Try stopping amlodipine for about 10 days and see if you feel stronger and better.  Update me in at that point.   ?Try taking buspar '5mg'$  twice a day in addition to your regular sertraline dose.   ?Take care.  Glad to see you. ?

## 2022-01-04 NOTE — Assessment & Plan Note (Signed)
Condolences offered.  Continue sertraline reasonable to try taking buspar '5mg'$  twice a day in addition to her regular sertraline dose.   Routine cautions given to patient.  She can update me if that is not tolerated. ?

## 2022-01-04 NOTE — Assessment & Plan Note (Signed)
Discussed options.  Reasonable to try stopping amlodipine for about 10 days and see if she feels stronger and better.  She can update me at that point.   ?

## 2022-01-07 ENCOUNTER — Telehealth: Payer: Self-pay | Admitting: Family Medicine

## 2022-01-07 MED ORDER — AMLODIPINE BESYLATE 2.5 MG PO TABS: 1.2500 mg | ORAL_TABLET | Freq: Every day | ORAL | Status: DC

## 2022-01-07 NOTE — Telephone Encounter (Signed)
Pt stated "Dr. Damita Dunnings took me off of my blood pressure medication: amLODipine (NORVASC) 2.5 MG tablet my blood pressure has gone back up so I need to go back on it, what are his suggestions."  ? ?Callback Number: [phone] ?

## 2022-01-07 NOTE — Telephone Encounter (Signed)
Called patient back to see if I could get some BP readings from her. Patient states her BP has been high since in the 170's/180's. BP this am was 172/65, then 176/89 yesterday, 159/77, 178/73, 172/73. Patient states its been bad and all over the place.  ?

## 2022-01-07 NOTE — Addendum Note (Signed)
Addended by: Tonia Ghent on: 01/07/2022 04:37 PM ? ? Modules accepted: Orders ? ?

## 2022-01-07 NOTE — Telephone Encounter (Signed)
Did she feel better off the medicine?  Could she tell any difference?  ? ?I would restart 1/2 tab in the meantime, ie 1.'25mg'$  daily.  Please let me know how she feels with that and how her BP is running.  Thanks.  ?

## 2022-01-08 NOTE — Telephone Encounter (Signed)
Patient states she did not feel any better off the medicine nor could she tell any difference. She has been having a lot of headaches lately that may be due to her BP's being up. Advised patient to start back on amlodipine 1/2 tab QD for now and call back in 1 week with update on how she is doing.  ?

## 2022-01-09 NOTE — Telephone Encounter (Signed)
Agreed.  Thanks.  

## 2022-01-18 NOTE — Progress Notes (Signed)
Cardiology Office Note    Date:  01/19/2022   ID:  Catherine Green, DOB 12-12-1940, MRN [id number]  PCP:  Tonia Ghent, MD  Cardiologist:  Ida Rogue, MD  Electrophysiologist:  None   Chief Complaint: Follow-up  History of Present Illness:   Catherine Green is a 81 y.o. female with history of CAD status post three-vessel CABG in 2006, HFpEF, bilateral carotid artery disease status post bilateral CEA followed by vascular surgery, HTN, HLD, frequent PVCs, atrial tachycardia, multiple medication intolerances, back pain status post surgery, prior tobacco use starting at age 55 and quitting at age 66 who presents for follow-up of hypertension.   Cardiac cath from 09/2007 demonstrated an atretic LIMA to LAD with patent vein grafts.   She was seen at her PCPs office in 10/2019 with dyspnea.  BNP mildly elevated at 300.  Her symptoms were felt to be likely multifactorial including volume overload and mild longstanding anemia.  She was started on Lasix 40 mg daily.  Echo in 11/2019 showed a hyperdynamic LVSF with an EF of 65 to 08%, grade 3 diastolic dysfunction, normal RV systolic function and RV cavity size, and trivial mitral regurgitation.  Unable to estimate right heart pressures, IVC not documented as being dilated.  Small pleural effusion was noted.  Following this echo she was taking Lasix 40 mg twice daily with continued swelling.  Recheck at her PCPs office in late 12/2019 demonstrated her weight was down 6 pounds, however she continued to note bilateral pedal edema and abdominal bloating as the day progressed.  She continued to have some occasional dyspnea in the afternoons.  Her appetite remained down.  She was seen in the office on 02/06/2020 noting increased dyspnea, PND, 2-3 pillow orthopnea, and increased fatigue.  BP was elevated in the 657Q to 469G systolic.  Her weight was down 8 pounds in the past 3 months with notes indicating she was missing meals.  She reported nausea when eating  with associated dysphagia.  Repeat BNP mildly improved at 239 with stable renal function.  She was taking Lasix 40 mg daily.  It was felt there was some component of venous insufficiency with her lower extremity swelling.  It was recommended she increase her Lasix to 40 mg twice daily for 1 week followed by 40 mg daily thereafter.  She was seen in 02/2020 noting improvement in her dyspnea following diuresis and had continued to take Lasix 40 mg twice daily since her visit in 01/2020.  Her weight is down another 6 pounds when compared to her last clinic visit.  She did note some positional dizziness and generalized fatigue.  She was orthostatic in the office that day.  Lasix was decreased to 40 mg daily.  Symptoms felt similar to her presentation leading up to her bypass.  In this setting, she underwent Lexiscan MPI on 03/15/2020 which showed no significant ischemia, normal wall motion, EF 76%, attenuation corrected images with aortic atherosclerosis and coronary artery calcifications, and was overall a low risk scan.  EKG showed PVCs.  CTA of the neck from 04/23/2020 showed no dissection or pseudoaneurysm without interval growth of remodeling/fusiform aneurysm within the right internal carotid artery, 30% left ICA stenosis, and 70% stenosis of the brachiocephalic origin.  There was 40% subclavian artery stenosis.  She was seen in the office on 04/29/2020 noting her dizziness persisted.  Her weight was stable.  She had further decreased her Lasix to 20 mg daily in the setting of ongoing dizziness.  She denied any shortness of breath or lower extremity swelling.  Orthostatic vital signs remained positive at that visit.  Imdur was discontinued and carvedilol was decreased to 3.125 mg twice daily.  Following this, she contacted our office in 05/2020, noting continued orthostasis, and in this setting carvedilol was held.  She was seen in the office on 05/29/2020, and continued to note positional dizziness.  She was no longer on  carvedilol or Imdur though was taking Lasix 40 mg daily.  Home BP readings were predominantly in the 397Q to 734L systolic.  Her dizziness was noted to date back at least to 02/2018.  She declined head CT.  She was advised to follow-up with her PCP.  It was again recommended her Lasix be decreased to 20 mg daily.  She was hypertensive when lying for orthostatic BP with orthostatics continuing to be mildly positive.  Renal artery ultrasound showed no evidence of renal artery stenosis.  She was seen in the office in 07/2020 noting some improvement in her chronic dizziness.  BP readings ranged from the 937T to 024 systolic with most heart rates in the 60s bpm. Carotid artery ultrasound in 07/2020, showed 1-39% bilateral ICA stenosis, antegrade flow along the vertebral arteries bilaterally, normal flow hemodynamics in the bilateral subclavian arteries. Bilateral ABI's were normal in 10/2020.    She was seen in 10/2020 and was doing well from a cardiac perspective.  She did continue to note a significant improvement in her chronic dizziness when having permissive BP.  Home BP readings were in the 150s to 160s for the most part with occasional readings in the 097D systolic.  She was frequently taking prn Coreg, and in this setting, it was recommended she take Coreg 3.125 mg bid.    She was seen on 01/08/2021 noting a several week history of elevated BP readings, particularly in the morning with many readings in the 160s to 532D mmHg systolic.  ReDs vest was elevated at 39%.  It was recommended she start amlodipine 5 mg nightly and continue carvedilol 3.125 mg twice daily.  Her Lasix was titrated to 40 mg daily for 3 days.  Labs obtained at that time showed a BNP of 163.  She underwent echo on 01/10/2021 which showed an EF of 60 to 65%, no regional wall motion abnormalities, indeterminate LV diastolic function parameters, normal RV systolic function and ventricular cavity size, and moderate mitral regurgitation.  She was  seen in the office on 01/14/2021 with stable weight and BP of 170/64 in the office.  She continued to note unchanged longstanding lightheadedness and dizziness.  She remained very anxious.  Her shortness of breath did improve some following increased diuresis.  She did note some palpitations.  Given prior noted orthostatic hypotension permissive hypertension was recommended and she was continued on carvedilol and amlodipine.  Lasix was adjusted to 40 mg alternating with 20 mg every other day.  She underwent Zio patch which showed a predominant rhythm of sinus with an average heart rate of 62 bpm (range 48 to 135 bpm), 3 runs of SVT/atrial tachycardia were noted with the fastest interval lasting 9 beats with a maximal rate of 135 bpm and the longest interval lasting 14 seconds with an average rate of 102 bpm.  There were frequent PVCs representing a 13.4% burden.  No patient triggered events were observed.  Given this, it was recommended amlodipine be discontinued and carvedilol be titrated to 6.25 mg twice daily to improve with PVC burden.   She  was seen in the office in 01/2021, by her primary cardiologist, and noted rare dizziness along with increased stress.  Weight was stable.  No changes were made.   She was seen in the office on 08/08/2021 and was grieving the loss of her sister, daughter, and granddaughter.  With this, she noted intermittent episodes of shortness of breath, particularly when upset.  With her grief, she had not had much of an appetite.  Her weight was down 5 pounds when compared to her prior clinic visit.  She had been started on Zoloft by her PCP, though had not noted an improvement in her grief.  She was without symptoms of angina or decompensation.  She underwent echo on 09/15/2021 which demonstrated an EF of 60 to 65%, no regional wall motion abnormalities, grade 2 diastolic dysfunction, normal RV systolic function and ventricular cavity size, mildly elevated PASP estimated at 43.7 mmHg,  moderate mitral regurgitation, and an estimated right atrial pressure of 3 mmHg.   She was seen in the office on 09/25/2021 and continued to feel poorly.  She noted intermittent substernal chest tightness that radiated to the left upper arm and would last for several minutes with spontaneous resolution.  With these episodes, she noted increased fatigue.  This pain did not feel like her prior angina.  She also continued to note positional dizziness and generalized malaise/fatigue.  She noted no dizziness in the way she felt on days where she took 20 mg of Lasix versus 40 mg of Lasix.  Her previously noted weight loss had stabilized.  She continued to grieve.  Given symptoms, she underwent Lexiscan MPI on 10/01/2021 which showed no evidence of ischemia and was overall low risk.  Coronary artery calcifications were noted in the LAD and RCA territories along with a sending and descending aortic atherosclerosis.  She was last seen in the office on 11/19/2021 and was without symptoms of angina or decompensation.  She continued to note on changed, longstanding dizziness that appeared to be exacerbated with positional changes and eye movement.  Episodes at times were precipitated by a headache.  She also felt like the symptoms came on after taking carvedilol.  She was orthostatic in the office leading her furosemide to be decreased to 20 mg daily.  At her request, we discontinued carvedilol.  She declined replacement medication.  She contacted our office 2 days later noting elevated BP with a headache.  She was subsequently started on hydralazine 25 mg 3 times daily.  She subsequently called on 12/04/2021 complaining of no appetite, nausea, vomiting, and "not feeling well.  Hydralazine was discontinued and she was started on amlodipine 2.5 mg nightly.  She followed up with her PCP on 01/02/2022 noting generalized weakness with recommendation to hold amlodipine.  She comes in today and is without symptoms of angina or  decompensation.  She notes unchanged longstanding generalized malaise, fatigue, and dizziness.  Blood pressure readings at home have largely ranged from 120 to 694 mmHg systolic.  She did have an isolated reading in the 854O systolic on 2/70/3500.  She remains on amlodipine 2.5 mg nightly.  She discontinued BuSpar that was recently started by her PCP as she felt like this made her feel like a "zombie."  No significant lower extremity swelling or orthopnea.  She does continue to feel like she is depressed following recent losses in her family.  She does request a refill of previously prescribed albuterol as she felt like this helped with her nocturnal cough.  Labs independently reviewed: 07/2021 - potassium 4.3, BUN 18, serum creatinine 0.76, Hgb 11.7, PLT 314 03/2021 - albumin 4.3, AST/ALT normal 01/2021 - TC 110, TG 80, HDL 66, LDL 28, A1c 5.8, BNP 137 07/2020 - TSH normal  Past Medical History:  Diagnosis Date   Allergy    hay fever   Anemia    Anxiety    Back pain    Carotid artery occlusion    Cerebrovascular disease    extracranial; occlusive   Chicken pox    Coronary artery disease    Depression    Dizziness    Dizziness    DVT (deep venous thrombosis) (HCC)    Fainting spell    GERD (gastroesophageal reflux disease)    Headache    Heart murmur    Hyperlipidemia    Hypertension    Leg pain    PONV (postoperative nausea and vomiting)    severe nausea and vomiting   Pre-syncope    PVD (peripheral vascular disease) ([place])    endarterectomy by [name]   Seasonal allergies    Shortness of breath dyspnea    wth ambulation at times   Swelling of both ankles    and abdomen; takes Lasix when needed   Thrombophlebitis    following childbirth   Ulcer     Past Surgical History:  Procedure Laterality Date   APPENDECTOMY     BACK SURGERY  01/14/16   BREAST SURGERY     saline implants   CARDIAC CATHETERIZATION     CAROTID ENDARTERECTOMY  1992   CAROTID ENDARTERECTOMY  Right February 13, 2005   Re-do Right CE   CATARACT EXTRACTION, BILATERAL  2013   CHOLECYSTECTOMY     CHOLECYSTECTOMY, LAPAROSCOPIC  04/02/14   Dr. Rochel Brome   CORONARY ARTERY BYPASS GRAFT  2006   x3 Dr. Prescott Gum   ENDARTERECTOMY Left 07/20/2014   Procedure: ENDARTERECTOMY CAROTID-LEFT;  Surgeon: Rosetta Posner, MD;  Location: [facility];  Service: Vascular;  Laterality: Left;   EYE SURGERY Bilateral Feb. 2013   Cataract Left eye   SEPTOPLASTY     with bilateral inferior turbinate reductions   SPINE SURGERY  12/2015   TOTAL ABDOMINAL HYSTERECTOMY     UPPER GASTROINTESTINAL ENDOSCOPY     had polyps removed from esophagus   UPPER GI ENDOSCOPY      Current Medications: Current Meds  Medication Sig   albuterol (VENTOLIN HFA) 108 (90 Base) MCG/ACT inhaler Inhale 2 puffs into the lungs every 6 (six) hours as needed for wheezing or shortness of breath.   amLODipine (NORVASC) 2.5 MG tablet Take 0.5 tablets (1.25 mg total) by mouth daily.   aspirin 81 MG tablet Take 1 tablet (81 mg total) by mouth daily.   Coenzyme Q10 (CO Q10) 100 MG CAPS Take 100 mg by mouth daily.   ezetimibe (ZETIA) 10 MG tablet TAKE ONE TABLET EVERY DAY   furosemide (LASIX) 20 MG tablet Take 1 tablet (20 mg total) by mouth daily.   gabapentin (NEURONTIN) 400 MG capsule TAKE 1 CAPSULE BY MOUTH EVERY 8 HOURS   ketoconazole (NIZORAL) 2 % shampoo Apply topically as directed.   metoprolol succinate (TOPROL XL) 25 MG 24 hr tablet Take 0.5 tablets (12.5 mg total) by mouth at bedtime.   omeprazole (PRILOSEC) 20 MG capsule Take 1 capsule (20 mg total) by mouth daily.   potassium chloride (KLOR-CON) 10 MEQ tablet TAKE 1 TABLET BY MOUTH DAILY   rosuvastatin (CRESTOR) 20 MG tablet TAKE  ONE TABLET BY MOUTH DAILY   sertraline (ZOLOFT) 100 MG tablet Take 1.5 tablets (150 mg total) by mouth daily.   vitamin B-12 (CYANOCOBALAMIN) 1000 MCG tablet Take 1 tablet (1,000 mcg total) by mouth daily.    Allergies:   Lyrica [pregabalin],  Hydrocodone, Ivp dye [iodinated contrast media], Lipitor [atorvastatin], Oxycodone hcl, Penicillin g, Shellfish allergy, Clavulanic acid, Prednisone, Augmentin [amoxicillin-pot clavulanate], Codeine, Cymbalta [duloxetine hcl], Demerol, Doxycycline, Effexor [venlafaxine], Hydrochlorothiazide, Hydroxyzine, Metoprolol, Protonix [pantoprazole sodium], and Trazodone and nefazodone   Social History   Socioeconomic History   Marital status: Married    Spouse name: Jeneen Rinks    Number of children: 1   Years of education: Not on file   Highest education level: 11th grade  Occupational History    Comment: Retired   Tobacco Use   Smoking status: Former    Packs/day: 0.30    Years: 15.00    Pack years: 4.50    Types: Cigarettes    Quit date: 08/31/1996    Years since quitting: 25.4   Smokeless tobacco: Never  Vaping Use   Vaping Use: Never used  Substance and Sexual Activity   Alcohol use: No    Alcohol/week: 0.0 standard drinks   Drug use: No   Sexual activity: Not Currently  Other Topics Concern   Not on file  Social History Narrative   Lives with husband   Work - retired Emergency planning/management officer   Diet - healthy   Right handed    Caffeine- 1 cup per day.   Social Determinants of Health   Financial Resource Strain: Low Risk    Difficulty of Paying Living Expenses: Not very hard  Food Insecurity: Not on file  Transportation Needs: Not on file  Physical Activity: Not on file  Stress: Not on file  Social Connections: Not on file     Family History:  The patient's family history includes Aneurysm in her brother and mother; Birth defects in her brother; Bleeding Disorder in her mother and sister; Cancer in her sister; Cirrhosis in her father; Deep vein thrombosis in her brother; Heart attack in her father; Heart disease in her brother, father, mother, and sister; Hyperlipidemia in her brother, daughter, father, mother, and sister; Hypertension in her brother, daughter, father, mother, sister, and  another family member; Peripheral vascular disease in her daughter; Varicose Veins in her mother and sister. There is no history of Breast cancer or Colon cancer.  ROS:   12-point review of systems is negative unless otherwise noted in the HPI.   EKGs/Labs/Other Studies Reviewed:    Studies reviewed were summarized above. The additional studies were reviewed today:  2D echo 11/2019: 1. Left ventricular ejection fraction, by estimation, is 65 to 70%. The  left ventricle has normal function. Left ventricular endocardial border  not optimally defined to evaluate regional wall motion. Left ventricular  diastolic parameters are consistent  with Grade III diastolic dysfunction (restrictive). Elevated left atrial  pressure.   2. Right ventricular systolic function is normal. The right ventricular  size is normal. Tricuspid regurgitation signal is inadequate for assessing  PA pressure.   3. The mitral valve is grossly normal. Trivial mitral valve  regurgitation.   4. The aortic valve was not well visualized. Aortic valve regurgitation  is not visualized. No aortic stenosis is present.   5. The inferior vena cava is normal in size with greater than 50%  respiratory variability, suggesting right atrial pressure of 3 mmHg. __________   Carlton Adam MPI 02/2020: Pharmacological  myocardial perfusion imaging study with no significant  ischemia Normal wall motion, EF estimated at 76% No EKG changes concerning for ischemia at peak stress or in recovery. Resting EKG with PVCs Attenuation correction images with aortic atherosclerosis and coronary calcification Low risk scan ___________   2D echo 01/10/2021:  1. Left ventricular ejection fraction, by estimation, is 60 to 65%. The  left ventricle has normal function. The left ventricle has no regional  wall motion abnormalities. Left ventricular diastolic parameters are  indeterminate.   2. Right ventricular systolic function is normal. The right  ventricular  size is normal. Tricuspid regurgitation signal is inadequate for assessing  PA pressure.   3. The mitral valve was not well visualized. Moderate mitral valve  regurgitation.  __________   Elwyn Reach patch 12/2020: Predominant underlying rhythm was Sinus Rhythm.  Patient had a min HR of 48 bpm, max HR of 135 bpm, and avg HR of 62 bpm.   3 Supraventricular Tachycardia/atrial tachycardia runs occurred, the run with the fastest interval lasting 9 beats with a max rate of 135 bpm, the  longest lasting 14.0 secs with an avg rate of 102 bpm.     Isolated SVEs were rare (<1.0%), SVE Couplets were rare (<1.0%), and SVE Triplets were rare  (<1.0%). Isolated VEs were frequent (13.4%, 115534), VE Couplets were rare (<1.0%, 84), and VE Triplets were rare (<1.0%, 1).  Ventricular Bigeminy and Trigeminy were present.   No patient triggered events noted __________   2D echo 09/15/2021: 1. Left ventricular ejection fraction, by estimation, is 60 to 65%. The  left ventricle has normal function. The left ventricle has no regional  wall motion abnormalities. Left ventricular diastolic parameters are  consistent with Grade II diastolic  dysfunction (pseudonormalization).   2. Right ventricular systolic function is normal. The right ventricular  size is normal. There is mildly elevated pulmonary artery systolic  pressure. The estimated right ventricular systolic pressure is 81.0 mmHg.   3. The mitral valve is normal in structure. Moderate mitral valve  regurgitation. No evidence of mitral stenosis.   4. The aortic valve is normal in structure. Aortic valve regurgitation is  not visualized. No aortic stenosis is present.   5. The inferior vena cava is normal in size with greater than 50%  respiratory variability, suggesting right atrial pressure of 3 mmHg.   Comparison(s): LVEF 60-65%, Moderate MR. __________   Carlton Adam MPI 10/01/2021:   The study is low risk.   No ST deviation was noted.    Left ventricular function is normal. Calculated LVEF is 79%.   There is no evidence for ischemia.   LAD and RCA calcifications noted.   Ascending and descending aorta calcifications noted.   EKG:  EKG is not ordered today.    Recent Labs: 04/14/2021: ALT 11 08/08/2021: BUN 18; Creatinine, Ser 0.76; Hemoglobin 11.7; Platelets 314; Potassium 4.3; Sodium 138  Recent Lipid Panel    Component Value Date/Time   CHOL 110 02/26/2021 1124   TRIG 80 02/26/2021 1124   HDL 66 02/26/2021 1124   CHOLHDL 1.7 02/26/2021 1124   CHOLHDL 2 02/01/2019 0907   VLDL 21.8 02/01/2019 0907   LDLCALC 28 02/26/2021 1124   LDLDIRECT 139.1 04/11/2012 1131    PHYSICAL EXAM:    VS:  BP 140/60 (BP Location: Left Arm, Patient Position: Sitting, Cuff Size: Normal)   Pulse 71   Ht '5\' 2"'$  (1.575 m)   Wt 143 lb 6 oz (65 kg)   SpO2 96%  BMI 26.22 kg/m   BMI: Body mass index is 26.22 kg/m.  Physical Exam Vitals reviewed.  Constitutional:      Appearance: She is well-developed.  HENT:     Head: Normocephalic and atraumatic.  Eyes:     General:        Right eye: No discharge.        Left eye: No discharge.  Neck:     Vascular: No JVD.  Cardiovascular:     Rate and Rhythm: Normal rate and regular rhythm.     Pulses:          Carotid pulses are  on the right side with bruit and  on the left side with bruit.      Dorsalis pedis pulses are 2+ on the right side and 2+ on the left side.       Posterior tibial pulses are 2+ on the right side and 2+ on the left side.     Heart sounds: Normal heart sounds, S1 normal and S2 normal. Heart sounds not distant. No midsystolic click and no opening snap. No murmur heard.   No friction rub.  Pulmonary:     Effort: Pulmonary effort is normal. No respiratory distress.     Breath sounds: Normal breath sounds. No decreased breath sounds, wheezing or rales.  Chest:     Chest wall: No tenderness.  Abdominal:     General: There is no distension.     Palpations: Abdomen is  soft.     Tenderness: There is no abdominal tenderness.  Musculoskeletal:     Cervical back: Normal range of motion.     Right lower leg: No edema.     Left lower leg: No edema.  Skin:    General: Skin is warm and dry.     Nails: There is no clubbing.  Neurological:     Mental Status: She is alert and oriented to person, place, and time.  Psychiatric:        Speech: Speech normal.        Behavior: Behavior normal.        Thought Content: Thought content normal.        Judgment: Judgment normal.    Wt Readings from Last 3 Encounters:  01/19/22 143 lb 6 oz (65 kg)  01/02/22 142 lb 8 oz (64.6 kg)  11/19/21 144 lb 6 oz (65.5 kg)     ASSESSMENT & PLAN:   HFpEF/pulmonary hypertension: She is euvolemic and well compensated.  He remains on low-dose furosemide 20 mg daily.  Recent labs stable.  Labile HTN with orthostatic hypotension: Blood pressure is reasonably controlled in the office today.  She will need some degree of permissive hypertension moving forward.  Ideally, we should aim to keep her blood pressure between 120 and 160 mmHg.  We will add low-dose Toprol-XL 12.5 mg nightly and move amlodipine 2.5 mg from the night to morning.  Compression stockings and adequate hydration recommended.  CAD status post CABG without angina: She is without symptoms of angina or decompensation.  Recent Lexiscan MPI was without evidence of ischemia and overall low risk.  Continue aggressive risk factor modification and secondary prevention including aspirin, rosuvastatin, ezetimibe, and rechallenge of metoprolol as outlined above.  Frequent PVCs/atrial tachycardia: She does note some intermittent palpitations off beta-blocker.  We will rechallenge her with Toprol-XL 12.5 mg nightly.  Mitral regurgitation: Stable on recent echo.  Follow periodically.  HLD: LDL 28 in 01/2021.  She remains on rosuvastatin and  ezetimibe.  Carotid artery disease: 1 to 39% bilateral ICA stenosis on imaging in 08/2021.   She remains on aspirin and statin.  Followed by vascular surgery.  Dizziness/malaise/fatigue: Longstanding issues.  Prior MRI of the brain from 07/2018 showed no evidence of CVA.  I do not necessarily think these longstanding issues are exclusively related to her blood pressure.  I suspect there is a component of underlying depression and physical deconditioning contributing.  I have encouraged her to continue discussion of this with her PCP.  I did send in an albuterol inhaler at her request.  Medication intolerance: Ongoing management of her labile hypertension is limited/intolerances.   Disposition: F/u with Dr. Rockey Situ or an APP in 3 months.   Medication Adjustments/Labs and Tests Ordered: Current medicines are reviewed at length with the patient today.  Concerns regarding medicines are outlined above. Medication changes, Labs and Tests ordered today are summarized above and listed in the Patient Instructions accessible in Encounters.   SignedChristell Faith, PA-C 01/19/2022 11:11 AM     [address] [place], [place] [address] [phone]

## 2022-01-19 ENCOUNTER — Ambulatory Visit: Payer: Medicare HMO | Admitting: Physician Assistant

## 2022-01-19 ENCOUNTER — Encounter: Payer: Self-pay | Admitting: Physician Assistant

## 2022-01-19 VITALS — BP 140/60 | HR 71 | Ht 62.0 in | Wt 143.4 lb

## 2022-01-19 DIAGNOSIS — I272 Pulmonary hypertension, unspecified: Secondary | ICD-10-CM

## 2022-01-19 DIAGNOSIS — I5032 Chronic diastolic (congestive) heart failure: Secondary | ICD-10-CM | POA: Diagnosis not present

## 2022-01-19 DIAGNOSIS — R42 Dizziness and giddiness: Secondary | ICD-10-CM

## 2022-01-19 DIAGNOSIS — E785 Hyperlipidemia, unspecified: Secondary | ICD-10-CM | POA: Diagnosis not present

## 2022-01-19 DIAGNOSIS — I340 Nonrheumatic mitral (valve) insufficiency: Secondary | ICD-10-CM

## 2022-01-19 DIAGNOSIS — I471 Supraventricular tachycardia: Secondary | ICD-10-CM | POA: Diagnosis not present

## 2022-01-19 DIAGNOSIS — I6523 Occlusion and stenosis of bilateral carotid arteries: Secondary | ICD-10-CM

## 2022-01-19 DIAGNOSIS — I493 Ventricular premature depolarization: Secondary | ICD-10-CM

## 2022-01-19 DIAGNOSIS — R5383 Other fatigue: Secondary | ICD-10-CM

## 2022-01-19 DIAGNOSIS — I25118 Atherosclerotic heart disease of native coronary artery with other forms of angina pectoris: Secondary | ICD-10-CM

## 2022-01-19 DIAGNOSIS — I10 Essential (primary) hypertension: Secondary | ICD-10-CM | POA: Diagnosis not present

## 2022-01-19 DIAGNOSIS — Z789 Other specified health status: Secondary | ICD-10-CM

## 2022-01-19 DIAGNOSIS — I951 Orthostatic hypotension: Secondary | ICD-10-CM | POA: Diagnosis not present

## 2022-01-19 DIAGNOSIS — Z951 Presence of aortocoronary bypass graft: Secondary | ICD-10-CM

## 2022-01-19 MED ORDER — ALBUTEROL SULFATE HFA 108 (90 BASE) MCG/ACT IN AERS: 2.0000 | INHALATION_SPRAY | Freq: Four times a day (QID) | RESPIRATORY_TRACT | 0 refills | Status: DC | PRN

## 2022-01-19 MED ORDER — METOPROLOL SUCCINATE ER 25 MG PO TB24: 12.5000 mg | ORAL_TABLET | Freq: Every day | ORAL | 3 refills | Status: DC

## 2022-01-19 NOTE — Patient Instructions (Addendum)
Medication Instructions:  Your physician has recommended you make the following change in your medication:   TAKE your Amlodipine 1/2 tablet in the morning START Toprol XL (metoprolol succinate) 1/2 tablet at bedtime AS NEEDED Albuterol inhaler  *If you need a refill on your cardiac medications before your next appointment, please call your pharmacy*   Lab Work: None  If you have labs (blood work) drawn today and your tests are completely normal, you will receive your results only by: [place] (if you have MyChart) OR A paper copy in the mail If you have any lab test that is abnormal or we need to change your treatment, we will call you to review the results.   Testing/Procedures: None   Follow-Up: At [facility], you and your health needs are our priority.  As part of our continuing mission to provide you with exceptional heart care, we have created designated Provider Care Teams.  These Care Teams include your primary Cardiologist (physician) and Advanced Practice Providers (APPs -  Physician Assistants and Nurse Practitioners) who all work together to provide you with the care you need, when you need it.    Your next appointment:   3 month(s)  The format for your next appointment:   In Person  Provider:   Ida Rogue, MD or Christell Faith, PA-C       Important Information About Sugar

## 2022-02-05 ENCOUNTER — Ambulatory Visit (INDEPENDENT_AMBULATORY_CARE_PROVIDER_SITE_OTHER): Payer: Medicare HMO | Admitting: Family Medicine

## 2022-02-05 ENCOUNTER — Encounter: Payer: Self-pay | Admitting: *Deleted

## 2022-02-05 ENCOUNTER — Encounter: Payer: Self-pay | Admitting: Family Medicine

## 2022-02-05 VITALS — BP 150/72 | HR 63 | Temp 97.7°F | Ht 62.0 in | Wt 143.5 lb

## 2022-02-05 DIAGNOSIS — F32A Depression, unspecified: Secondary | ICD-10-CM

## 2022-02-05 DIAGNOSIS — I10 Essential (primary) hypertension: Secondary | ICD-10-CM | POA: Diagnosis not present

## 2022-02-05 DIAGNOSIS — F419 Anxiety disorder, unspecified: Secondary | ICD-10-CM | POA: Diagnosis not present

## 2022-02-05 DIAGNOSIS — R1319 Other dysphagia: Secondary | ICD-10-CM

## 2022-02-05 DIAGNOSIS — R69 Illness, unspecified: Secondary | ICD-10-CM | POA: Diagnosis not present

## 2022-02-05 MED ORDER — OMEPRAZOLE 20 MG PO CPDR: 20.0000 mg | DELAYED_RELEASE_CAPSULE | Freq: Two times a day (BID) | ORAL | Status: DC

## 2022-02-05 MED ORDER — SERTRALINE HCL 100 MG PO TABS: 50.0000 mg | ORAL_TABLET | Freq: Every day | ORAL | Status: DC

## 2022-02-05 NOTE — Patient Instructions (Signed)
We'll call about seeing the GI clinic.  If you don't hear about that by the middle of next week then let me know .  In the meantime, start taking omeprazole twice a day.  Small sips and small bites of food in the meantime.  Take care.  Glad to see you.

## 2022-02-05 NOTE — Progress Notes (Signed)
C/o trouble swallowing- food/drink.  Feels like a knot episodically in the upper sternal area. Denies any pain. Started several wks ago, worsening in the meantime.  Voice is hoarser. No cough.  She can still breath during an event.  Occ she had sx to the point of needing to vomit.  Sometimes it will clear on its own without vomiting.  Her legs feel stronger on the lower dose of amlodipine, d/w pt.    She didn't feel well on buspar and had to stop it.  It didn't help her mood, d/w pt.  She cut back to '50mg'$  sertraline.  Mood is better as is, with the lower dose of BP medicine.    Meds, vitals, and allergies reviewed.   ROS: Per HPI unless specifically indicated in ROS section   GEN: nad, alert and oriented HEENT: mucous membranes moist, voice is slightly hoarse but no stridor on exam. NECK: supple w/o LA CV: rrr.  PULM: ctab, no inc wob ABD: soft, +bs EXT: no edema SKIN:  no acute rash but R shin 1cm x 1cm well demarcated biopsy site that is darker than surrounding tissue (discussed that this appears to be normal healing and should gradually fade and but she can have postinflammatory/postbiopsy hyperpigmentation.)

## 2022-02-06 NOTE — Assessment & Plan Note (Signed)
Refer to GI.  See after visit summary. In the meantime, start taking omeprazole twice a day.  Small sips and small bites of food in the meantime.  Routine cautions given to patient.  Anatomy discussed with patient.

## 2022-02-06 NOTE — Assessment & Plan Note (Signed)
She didn't feel well on buspar and had to stop it.  It didn't help her mood, d/w pt.  She cut back to '50mg'$  sertraline.  Mood is better as is, with the lower dose of amlodipine.  I would continue as is.  She agrees.  She can update me as needed.

## 2022-02-06 NOTE — Assessment & Plan Note (Signed)
She feels better on the lower dose of amlodipine.  Would continue as is.

## 2022-02-07 ENCOUNTER — Other Ambulatory Visit: Payer: Self-pay | Admitting: Family Medicine

## 2022-02-12 ENCOUNTER — Other Ambulatory Visit: Payer: Self-pay | Admitting: Physician Assistant

## 2022-02-13 ENCOUNTER — Telehealth: Payer: Self-pay

## 2022-02-13 NOTE — Progress Notes (Signed)
    Chronic Care Management Pharmacy Assistant   Name: Catherine Green  MRN: [id number] DOB: 02/12/1941  Reason for Encounter: CCM (Appointment Reminder)  Medications: Outpatient Encounter Medications as of 02/13/2022  Medication Sig   albuterol (VENTOLIN HFA) 108 (90 Base) MCG/ACT inhaler Inhale 2 puffs into the lungs every 6 (six) hours as needed for wheezing or shortness of breath.   amLODipine (NORVASC) 2.5 MG tablet Take 0.5 tablets (1.25 mg total) by mouth daily.   aspirin 81 MG tablet Take 1 tablet (81 mg total) by mouth daily.   Coenzyme Q10 (CO Q10) 100 MG CAPS Take 100 mg by mouth daily.   ezetimibe (ZETIA) 10 MG tablet TAKE ONE TABLET EVERY DAY   furosemide (LASIX) 20 MG tablet Take 1 tablet (20 mg total) by mouth daily.   gabapentin (NEURONTIN) 400 MG capsule TAKE 1 CAPSULE BY MOUTH EVERY 8 HOURS   ketoconazole (NIZORAL) 2 % shampoo Apply topically as directed.   metoprolol succinate (TOPROL XL) 25 MG 24 hr tablet Take 0.5 tablets (12.5 mg total) by mouth at bedtime.   omeprazole (PRILOSEC) 20 MG capsule Take 1 capsule (20 mg total) by mouth 2 (two) times daily before a meal.   potassium chloride (KLOR-CON) 10 MEQ tablet TAKE 1 TABLET BY MOUTH DAILY   rosuvastatin (CRESTOR) 20 MG tablet TAKE ONE TABLET BY MOUTH DAILY   sertraline (ZOLOFT) 100 MG tablet Take 0.5-1 tablets (50-100 mg total) by mouth daily.   vitamin B-12 (CYANOCOBALAMIN) 1000 MCG tablet Take 1 tablet (1,000 mcg total) by mouth daily.   No facility-administered encounter medications on file as of 02/13/2022.   Catherine Green was contacted to remind of upcoming telephone visit with Charlene Brooke on 02/18/2022 at 3:00. Patient was reminded to have any blood glucose and blood pressure readings available for review at appointment.   Patient confirmed appointment.  Are you having any problems with your medications? No   Do you have any concerns you like to discuss with the pharmacist? No  CCM referral has been  placed prior to visit?  No   Star Rating Drugs: Medication:  Last Fill: Day Supply Rosuvastatin 20 mg 02/07/2022 [place], CPP notified  Marijean Niemann, [facility] Pharmacy Assistant [phone]

## 2022-02-18 ENCOUNTER — Ambulatory Visit: Payer: Medicare HMO | Admitting: Pharmacist

## 2022-02-18 DIAGNOSIS — E782 Mixed hyperlipidemia: Secondary | ICD-10-CM

## 2022-02-18 DIAGNOSIS — F419 Anxiety disorder, unspecified: Secondary | ICD-10-CM

## 2022-02-18 DIAGNOSIS — I10 Essential (primary) hypertension: Secondary | ICD-10-CM

## 2022-02-18 DIAGNOSIS — I5032 Chronic diastolic (congestive) heart failure: Secondary | ICD-10-CM

## 2022-02-18 DIAGNOSIS — R1319 Other dysphagia: Secondary | ICD-10-CM

## 2022-02-18 NOTE — Patient Instructions (Signed)
Visit Information  Phone number for Pharmacist: [phone]   Goals Addressed   None     Care Plan : [place]  Updates made by Charlton Haws, RPH since 02/18/2022 12:00 AM     Problem: Hypertension, Hyperlipidemia, Heart Failure, Coronary Artery Disease, GERD, Depression, and Anxiety   Priority: High  Onset Date: 06/16/2021     Long-Range Goal: Disease mgmt   Start Date: 08/22/2021  Expected End Date: 08/22/2022  This Visit's Progress: On track  Recent Progress: On track  Priority: High  Note:   Current Barriers:  None identified  Pharmacist Clinical Goal(s):  Patient will contact provider office for questions/concerns as evidenced notation of same in electronic health record through collaboration with PharmD and provider.   Interventions: 1:1 collaboration with Tonia Ghent, MD regarding development and update of comprehensive plan of care as evidenced by provider attestation and co-signature Inter-disciplinary care team collaboration (see longitudinal plan of care) Comprehensive medication review performed; medication list updated in electronic medical record  Hyperlipidemia/CAD/PAD (LDL goal < 70) -Controlled - LDL 28 (01/2021) at goal; pt affirms compliance with medications below -Hx CABG -Current treatment: Ezetimibe 10 mg daily -Appropriate, Effective, Safe, Accessible Rosuvastatin 20 mg daily -Appropriate, Effective, Safe, Accessible Aspirin 81 mg daily -Appropriate, Effective, Safe, Accessible Coenzyme Q10 100 mg daily - Appropriate, Effective, Safe, Accessible -Medications previously tried: Repatha (Cost), simvastatin, atorvastatin -Educated on Cholesterol goals; Benefits of statin for ASCVD risk reduction; -Recommended to continue current medication  Hypertension / Heart Failure (Goal: SBP 120-160) -Controlled -Denies swelling with Lasix  -Last ejection fraction: 60-65% (04/2955); diastolic parameters indeterminate -HF type:  Diastolic; NYHA Class: I (no actitivty limitation) -Current home BP readings: SBP 119-130 -Follows with cardiology (Dr Rockey Situ) - permissive HTN strategy due to orthostasis -Current treatment: Furosemide 20 mg daily - Appropriate, Effective, Safe, Accessible Potassium chloride 10 mEq daily - Appropriate, Effective, Safe, Accessible Amlodipine 2.5 mg -1/2 tab AM -Appropriate, Effective, Safe, Accessible Metoprolol succinate 12.5 mg daily PM - Appropriate, Effective, Safe, Accessible -Medications previously tried: amlodipine   -Educated on Proper diuretic administration and potassium supplementation -Recommended to continue current medication  Depression/Anxiety (Goal: Improve mood) -Improving - pt reports mood is ok with sertraline dose at 100 mg, she has been on 150 mg in past and tapered herself down to 50 mg recently but felt it was not working so she went back up to 100 mg -Grief reaction - multiple deaths in family 2020-2023 -Current treatment: Sertraline 100 mg daily - Appropriate, Effective, Safe, Accessible -Medications previously tried/failed: venlafaxine, buspirone -Recommend to continue current medication  GERD (Goal: Improve symptoms) -Controlled - pt reports improvement in symptoms since increasing omeprazole to BID ear[place] this month -GI appt scheduled for August w/ Dr Haig Prophet -Current treatment  Omeprazole 20 mg BID - Appropriate, Effective, Safe, Accessible -Medications previously tried: n/a -Recommended to continue current medication  Health Maintenance -Vaccine gaps: Shingrix  Patient Goals/Self-Care Activities Patient will:  - take medications as prescribed as evidenced by patient report and record review focus on medication adherence by pill box check blood pressure weekly, document, and provide at future appointments      Patient verbalizes understanding of instructions and care plan provided today and agrees to view in [place]. Active MyChart status and  patient understanding of how to access instructions and care plan via MyChart confirmed with patient.    Telephone follow up appointment with pharmacy team member scheduled for: 6 months  Charlene Brooke, PharmD, [facility] Clinical Pharmacist [place]  Primary Care at [place]

## 2022-02-18 NOTE — Progress Notes (Signed)
Chronic Care Management Pharmacy Note  02/18/2022 Name:  Catherine Green MRN:  [id number] DOB:  [birth year]  Summary: CCM F/U visit -Reviewed medications; pt affirms compliance as prescribed -BP is at goal at home on average (SBP 119-130 over past several weeks) -Pt reports she increased sertraline to 100 mg since 50 mg was not working as well  -Pt reports improvement in dysphagia sent increasing omeprazole; she also has appt scheduled with GI in August  Recommendations/Changes made from today's visit: -No med changes  Plan: -[facility] will call patient in 3 months for BP check -Pharmacist follow up televisit scheduled for 6 months    Subjective: Catherine Green is an 81 y.o. year old female who is a primary patient of Damita Dunnings, Elveria Rising, MD.  The CCM team was consulted for assistance with disease management and care coordination needs.    Engaged with patient by telephone for follow up visit in response to provider referral for pharmacy case management and/or care coordination services.   Consent to Services:  The patient was given information about Chronic Care Management services, agreed to services, and gave verbal consent prior to initiation of services.  Please see initial visit note for detailed documentation.   Patient Care Team: Tonia Ghent, MD as PCP - General (Family Medicine) Rockey Situ Kathlene November, MD as PCP - Cardiology (Cardiology) Minna Merritts, MD (Cardiology) Earnie Larsson, MD as Consulting Physician (Neurosurgery) Charlton Haws, [facility] as Pharmacist (Pharmacist)  Recent office visits:  02/05/22 Dr Damita Dunnings OV: dysphagia - increased omeprazole to BID. Referred to GI. Had to stop Buspar. Cut back to 50 mg of sertraline, updated Rx.  01/02/22 Dr Damita Dunnings OV: low mood (family deaths); try buspirone 5 mg BID. Hold amlodipine for 10 days.  Recent consult visits:  01/19/22 PA Christell Faith (cardiology): f/u HF, pulmonary HTN. Labile HTN - permissive HTN. BP goal  120-160. Add metoprolol succinate 12.5 mg HS and move amlodipine 2.5 mg to AM. D/C buspirone (not taking).  11/19/21 PA Christell Faith (cardiology): f/u HF. Change furosemide to 20 mg daily. D/C carvedilol.  09/25/21 PA Christell Faith (cardiology): f/u HF. Reduce carvedilol to 3.125 mg BID.  Hospital visits:  None in previous 6 months   Objective:  Lab Results  Component Value Date   CREATININE 0.76 08/08/2021   BUN 18 08/08/2021   GFR 70.32 04/14/2021   GFRNONAA >60 08/08/2021   GFRAA 73 07/29/2020   NA 138 08/08/2021   K 4.3 08/08/2021   CALCIUM 9.3 08/08/2021   CO2 29 08/08/2021   GLUCOSE 107 (H) 08/08/2021    Lab Results  Component Value Date/Time   HGBA1C 5.8 (A) 02/07/2021 09:25 AM   HGBA1C 5.8 (A) 11/13/2019 09:26 AM   HGBA1C 7.1 (H) 02/01/2019 09:07 AM   HGBA1C 6.6 (H) 11/03/2018 10:57 AM   GFR 70.32 04/14/2021 11:29 AM   GFR 63.51 01/22/2020 10:01 AM   MICROALBUR 11.7 (H) 11/03/2018 10:57 AM     Lab Results  Component Value Date   CHOL 110 02/26/2021   HDL 66 02/26/2021   LDLCALC 28 02/26/2021   LDLDIRECT 139.1 04/11/2012   TRIG 80 02/26/2021   CHOLHDL 1.7 02/26/2021       Latest Ref Rng & Units 04/14/2021   11:29 AM 01/22/2020   10:01 AM 11/27/2019   10:40 AM  Hepatic Function  Total Protein 6.0 - 8.3 g/dL 6.5  6.6  6.1   Albumin 3.5 - 5.2 g/dL 4.3  4.4  4.2  AST 0 - 37 U/L 16  15  14    ALT 0 - 35 U/L 11  10  11    Alk Phosphatase 39 - 117 U/L 92  104  91   Total Bilirubin 0.2 - 1.2 mg/dL 0.4  0.7  0.6     Lab Results  Component Value Date/Time   TSH 1.150 07/29/2020 11:13 AM   TSH 1.06 04/22/2018 09:23 AM       Latest Ref Rng & Units 08/08/2021    3:42 PM 04/14/2021   11:29 AM 01/14/2021    2:35 PM  CBC  WBC 4.0 - 10.5 K/uL 6.9  6.1  7.0   Hemoglobin 12.0 - 15.0 g/dL 11.7  11.7  11.9   Hematocrit 36.0 - 46.0 % 37.4  35.8  36.8   Platelets 150 - 400 K/uL 314  303.0  358     No results found for: "VD25OH"  Clinical ASCVD: Yes  The ASCVD Risk  score (Arnett DK, et al., 2019) failed to calculate for the following reasons:   The 2019 ASCVD risk score is only valid for ages 16 to 24       05/10/2021   10:48 AM 05/10/2021   10:14 AM 05/10/2021   10:07 AM  Depression screen PHQ 2/9  Decreased Interest 0 0 0  Down, Depressed, Hopeless 0 0 0  PHQ - 2 Score 0 0 0  Altered sleeping 0 0 0  Tired, decreased energy 0 0 0  Change in appetite 0 0 0  Feeling bad or failure about yourself  0  0  Trouble concentrating 0 0 0  Moving slowly or fidgety/restless 0 0 0  Suicidal thoughts 0 0 0  PHQ-9 Score 0 0 0  Difficult doing work/chores Not difficult at all Not difficult at all Not difficult at all   Social History   Tobacco Use  Smoking Status Former   Packs/day: 0.30   Years: 15.00   Total pack years: 4.50   Types: Cigarettes   Quit date: 08/31/1996   Years since quitting: 25.4  Smokeless Tobacco Never   BP Readings from Last 3 Encounters:  02/05/22 (!) 150/72  01/19/22 140/60  01/02/22 138/62   Pulse Readings from Last 3 Encounters:  02/05/22 63  01/19/22 71  01/02/22 63   Wt Readings from Last 3 Encounters:  02/05/22 143 lb 8 oz (65.1 kg)  01/19/22 143 lb 6 oz (65 kg)  01/02/22 142 lb 8 oz (64.6 kg)   BMI Readings from Last 3 Encounters:  02/05/22 26.25 kg/m  01/19/22 26.22 kg/m  01/02/22 26.06 kg/m    Assessment/Interventions: Review of patient past medical history, allergies, medications, health status, including review of consultants reports, laboratory and other test data, was performed as part of comprehensive evaluation and provision of chronic care management services.   SDOH:  (Social Determinants of Health) assessments and interventions performed: Yes SDOH Interventions    Flowsheet Row Most Recent Value  SDOH Interventions   Food Insecurity Interventions Intervention Not Indicated  Financial Strain Interventions Intervention Not Indicated       SDOH Screenings   Alcohol Screen: Not on file   Depression (PHQ2-9): Low Risk  (05/10/2021)   Depression (PHQ2-9)    PHQ-2 Score: 0  Financial Resource Strain: Low Risk  (02/18/2022)   Overall Financial Resource Strain (CARDIA)    Difficulty of Paying Living Expenses: Not very hard  Food Insecurity: No Food Insecurity (02/18/2022)   Hunger Vital Sign  Worried About Charity fundraiser in the Last Year: Never true    Ran Out of Food in the Last Year: Never true  Housing: Not on file  Physical Activity: Not on file  Social Connections: Not on file  Stress: Not on file  Tobacco Use: Medium Risk (02/05/2022)   Patient History    Smoking Tobacco Use: Former    Smokeless Tobacco Use: Never    Passive Exposure: Not on file  Transportation Needs: Not on file    CCM Care Plan  Allergies  Allergen Reactions   Lyrica [Pregabalin] Shortness Of Breath, Swelling and Other (See Comments)    chest tight   Hydrocodone Other (See Comments)    hallucinate   Ivp Dye [Iodinated Contrast Media] Hives and Itching        Lipitor [Atorvastatin] Nausea And Vomiting   Oxycodone Hcl Other (See Comments)    Hallucinations    Penicillin G Nausea And Vomiting   Shellfish Allergy Hives and Itching   Buspar [Buspirone]     Worsening mood   Clavulanic Acid    Prednisone    Augmentin [Amoxicillin-Pot Clavulanate] Nausea And Vomiting   Codeine Nausea And Vomiting and Other (See Comments)   Cymbalta [Duloxetine Hcl] Nausea Only    GI upset at 40m   Demerol Nausea And Vomiting   Doxycycline Nausea Only and Other (See Comments)    Weakness, sick to her stomach   Effexor [Venlafaxine] Nausea Only   Hydrochlorothiazide Other (See Comments)    Not an allergy but urinary frequency and sweating at 262m  Hydroxyzine Other (See Comments)    Excessive sweating.    Metoprolol Other (See Comments)    Slowed body down, per pt    Protonix [Pantoprazole Sodium] Nausea Only   Trazodone And Nefazodone Other (See Comments)    Sedation     Medications  Reviewed Today     Reviewed by GoBrenton GrillsCMLansingCertified Medical Assistant) on 02/05/22 at 1154  Med List Status: <None>   Medication Order Taking? Sig Documenting Provider Last Dose Status Informant  albuterol (VENTOLIN HFA) 108 (90 Base) MCG/ACT inhaler [id number] 2 puffs into the lungs every 6 (six) hours as needed for wheezing or shortness of breath. DuRise MuPA-C  Active   amLODipine (NORVASC) 2.5 MG tablet [id number] 0.5 tablets (1.25 mg total) by mouth daily. DuTonia GhentMD  Active   aspirin 81 MG tablet [id number] 1 tablet (81 mg total) by mouth daily. GoMinna MerrittsMD  Active   Coenzyme Q10 (CO Q10) 100 MG CAPS [id number] 100 mg by mouth daily. [provider]  Active   ezetimibe (ZETIA) 10 MG tablet [id number] ONE TABLET EVERY DAY Gollan, TiKathlene NovemberMD  Active   furosemide (LASIX) 20 MG tablet [id number] 1 tablet (20 mg total) by mouth daily. DuRise MuPA-C  Active   gabapentin (NEURONTIN) 400 MG capsule [id number] 1 CAPSULE BY MOUTH EVERY 8 HOURS DuTonia GhentMD  Active   ketoconazole (NIZORAL) 2 % shampoo [id number] topically as directed. [provider]  Active   metoprolol succinate (TOPROL XL) 25 MG 24 hr tablet [id number] 0.5 tablets (12.5 mg total) by mouth at bedtime. DuRise MuPA-C  Active   omeprazole (PRILOSEC) 20 MG capsule [id number] 1 capsule (20 mg total) by mouth daily. DuTonia GhentMD  Active  potassium chloride (KLOR-CON) 10 MEQ tablet [id number]  TAKE 1 TABLET BY MOUTH DAILY Dunn, Dayna N, PA-C  Active   rosuvastatin (CRESTOR) 20 MG tablet [id number]  TAKE ONE TABLET BY MOUTH DAILY Rockey Situ, Kathlene November, MD  Active   sertraline (ZOLOFT) 100 MG tablet [id number]  Take 1.5 tablets (150 mg total) by mouth daily. Tonia Ghent, MD  Active   vitamin B-12 (CYANOCOBALAMIN) 1000 MCG tablet [id number]  Take 1 tablet (1,000 mcg total) by mouth daily. Tonia Ghent, MD   Active             Patient Active Problem List   Diagnosis Date Noted   Acute recurrent frontal sinusitis 04/21/2021   Impacted cerumen of right ear 04/21/2021   History of diabetes mellitus 02/09/2021   Scalp irritation 11/13/2020   Seasonal allergies 07/12/2019   Anemia 02/05/2019   Rash 09/04/2018   GERD (gastroesophageal reflux disease) 09/04/2018   Elevated CK 07/30/2018   Neuropathy 04/24/2018   Health care maintenance 01/19/2018   Medicare annual wellness visit, initial 01/19/2018   Grief 10/07/2017   Sweating abnormality 05/28/2017   Insomnia 05/28/2017   Fatigue 01/15/2017   Dysuria 05/06/2016   Skin lesion 03/16/2016   Aftercare following surgery of the circulatory system 02/18/2016   Spinal stenosis of lumbar region 08/21/2015   Labile blood pressure 05/17/2015   Leg pain, bilateral 02/08/2015   SOB (shortness of breath) 02/08/2015   Cough 01/17/2015   Low back pain 11/05/2014   Benign paroxysmal positional vertigo 09/04/2014   Carotid stenosis 07/03/2014   Preoperative cardiovascular examination 03/07/2014   Esophageal dysphagia 12/25/2013   Acute on chronic diastolic CHF (congestive heart failure) ([place]) 11/23/2013   Palpitations 11/23/2013   Urticaria 04/05/2013   Screening for colon cancer 05/27/2012   Hyperlipidemia 05/04/2012   Dizziness 04/11/2012   Anxiety and depression 03/10/2012   Carotid artery disease ([place]) 12/22/2011   Other and unspecified hyperlipidemia 10/08/2009   Essential hypertension 10/08/2009   CAD, ARTERY BYPASS GRAFT 10/08/2009   PVD 10/08/2009   SYNCOPE, HX OF 10/08/2009   THROMBOPHLEBITIS, HX OF 10/08/2009    Immunization History  Administered Date(s) Administered   Fluad Quad(high Dose 65+) 05/11/2019, 07/08/2020   Influenza Split 05/27/2012   Influenza Whole 06/20/2013   Influenza, High Dose Seasonal PF 05/27/2018   Influenza, Seasonal, Injecte, Preservative Fre 06/24/2016   Influenza,inj,Quad PF,6+ Mos 06/14/2014,  06/07/2015, 05/24/2017   PFIZER(Purple Top)SARS-COV-2 Vaccination 09/05/2019, 09/26/2019, 06/12/2020   Pfizer Covid-19 Vaccine Bivalent Booster 19yr & up 10/16/2021   Pneumococcal Conjugate-13 01/07/2017   Pneumococcal Polysaccharide-23 03/11/2011   Tdap 05/27/2012   Zoster, Live 03/11/2011    Conditions to be addressed/monitored:  Hypertension, Hyperlipidemia, Heart Failure, Coronary Artery Disease, GERD, Depression, and Anxiety   Care Plan : CCM Pharmacy Care Plan  Updates made by FCharlton Haws RGrantvillesince 02/18/2022 12:00 AM     Problem: Hypertension, Hyperlipidemia, Heart Failure, Coronary Artery Disease, GERD, Depression, and Anxiety   Priority: High  Onset Date: 06/16/2021     Long-Range Goal: Disease mgmt   Start Date: 08/22/2021  Expected End Date: 08/22/2022  This Visit's Progress: On track  Recent Progress: On track  Priority: High  Note:   Current Barriers:  None identified  Pharmacist Clinical Goal(s):  Patient will contact provider office for questions/concerns as evidenced notation of same in electronic health record through collaboration with PharmD and provider.   Interventions: 1:1 collaboration with DTonia Ghent MD regarding development and update of comprehensive plan  of care as evidenced by provider attestation and co-signature Inter-disciplinary care team collaboration (see longitudinal plan of care) Comprehensive medication review performed; medication list updated in electronic medical record  Hyperlipidemia/CAD/PAD (LDL goal < 70) -Controlled - LDL 28 (01/2021) at goal; pt affirms compliance with medications below -Hx CABG -Current treatment: Ezetimibe 10 mg daily -Appropriate, Effective, Safe, Accessible Rosuvastatin 20 mg daily -Appropriate, Effective, Safe, Accessible Aspirin 81 mg daily -Appropriate, Effective, Safe, Accessible Coenzyme Q10 100 mg daily - Appropriate, Effective, Safe, Accessible -Medications previously tried:  Repatha (Cost), simvastatin, atorvastatin -Educated on Cholesterol goals; Benefits of statin for ASCVD risk reduction; -Recommended to continue current medication  Hypertension / Heart Failure (Goal: SBP 120-160) -Controlled -Denies swelling with Lasix  -Last ejection fraction: 60-65% (03/2535); diastolic parameters indeterminate -HF type: Diastolic; NYHA Class: I (no actitivty limitation) -Current home BP readings: SBP 119-130 -Follows with cardiology (Dr Rockey Situ) - permissive HTN strategy due to orthostasis -Current treatment: Furosemide 20 mg daily - Appropriate, Effective, Safe, Accessible Potassium chloride 10 mEq daily - Appropriate, Effective, Safe, Accessible Amlodipine 2.5 mg -1/2 tab AM -Appropriate, Effective, Safe, Accessible Metoprolol succinate 12.5 mg daily PM - Appropriate, Effective, Safe, Accessible -Medications previously tried: amlodipine   -Educated on Proper diuretic administration and potassium supplementation -Recommended to continue current medication  Depression/Anxiety (Goal: Improve mood) -Improving - pt reports mood is ok with sertraline dose at 100 mg, she has been on 150 mg in past and tapered herself down to 50 mg recently but felt it was not working so she went back up to 100 mg -Grief reaction - multiple deaths in family 2020-2023 -Current treatment: Sertraline 100 mg daily - Appropriate, Effective, Safe, Accessible -Medications previously tried/failed: venlafaxine, buspirone -Recommend to continue current medication  GERD (Goal: Improve symptoms) -Controlled - pt reports improvement in symptoms since increasing omeprazole to BID earleir this month -GI appt scheduled for August w/ Dr Haig Prophet -Current treatment  Omeprazole 20 mg BID - Appropriate, Effective, Safe, Accessible -Medications previously tried: n/a -Recommended to continue current medication  Health Maintenance -Vaccine gaps: Shingrix  Patient Goals/Self-Care Activities Patient will:   - take medications as prescribed as evidenced by patient report and record review focus on medication adherence by pill box check blood pressure weekly, document, and provide at future appointments    Medication Assistance: None required.  Patient affirms current coverage meets needs.  Compliance/Adherence/Medication fill history: Care Gaps: None   Star-Rating Drugs: Rosuvastatin - PDC 97%  Medication Access: Within the past 30 days, how often has patient missed a dose of medication? 0 Is a pillbox or other method used to improve adherence? Yes  Factors that may affect medication adherence? no barriers identified Are meds synced by current pharmacy? No  Are meds delivered by current pharmacy? No  Does patient experience delays in picking up medications due to transportation concerns? No   [facility] Reviewed: Is patient disadvantaged to use [facility]?: No  Current Rx insurance plan: [employer] [place] Name and location of Current pharmacy:  [facility], [place] [address] [place] [address] Phone: [phone] Fax: [phone]  [facility] services reviewed with patient today?: No  Patient requests to transfer care to [facility]?: No  Reason patient declined to change pharmacies: Loyalty to other pharmacy/Patient preference   Care Plan and Follow Up Patient Decision:  Patient agrees to Care Plan and Follow-up.  Follow Up Plan: Telephone follow up appointment with care management team member scheduled for: 6  months  Charlene Brooke, PharmD, BCACP Clinical Pharmacist [facility] at [facility] [phone]

## 2022-02-24 ENCOUNTER — Ambulatory Visit: Payer: Medicare HMO | Admitting: Family Medicine

## 2022-02-26 ENCOUNTER — Encounter: Payer: Self-pay | Admitting: Family Medicine

## 2022-02-26 ENCOUNTER — Ambulatory Visit (INDEPENDENT_AMBULATORY_CARE_PROVIDER_SITE_OTHER): Payer: Medicare HMO | Admitting: Family Medicine

## 2022-02-26 DIAGNOSIS — R059 Cough, unspecified: Secondary | ICD-10-CM

## 2022-02-26 MED ORDER — AZITHROMYCIN 250 MG PO TABS: ORAL_TABLET | ORAL | 0 refills | Status: DC

## 2022-02-26 NOTE — Patient Instructions (Signed)
Start zithromax.   Rest and fluids.   Update me as needed.  Take care.  Glad to see you.

## 2022-02-26 NOTE — Progress Notes (Signed)
Sx for about 2-3 weeks.  Cough.  H/o atypical PNA per patient report.  Vomiting after eating.  Vomiting phlegm.  No blood in vomitus.  No fevers.  Chills, diarrhea prev but that got better in the meantime, fatigue.  Voice is altered.  No wheeze but voice is hoarse.  No facial pain but some B ear discomfort.  No ST.    Meds, vitals, and allergies reviewed.   ROS: Per HPI unless specifically indicated in ROS section   GEN: nad, alert and oriented HEENT: mucous membranes moist, L TM w/o erythema, R TM obscured by cerumen, nasal exam w/o erythema, clear discharge noted, R>L max sinus ttp NECK: supple w/o LA CV: rrr.  SEM noted at baseline.   PULM: ctab, no inc wob EXT: no edema SKIN: no acute rash

## 2022-03-01 NOTE — Assessment & Plan Note (Signed)
Lungs are clear.  Could be from sinusitis given her exam.  Discussed options.  Okay for outpatient follow-up Start zithromax.   Rest and fluids.   Update me as needed.  She agrees with plan.

## 2022-04-08 ENCOUNTER — Ambulatory Visit (INDEPENDENT_AMBULATORY_CARE_PROVIDER_SITE_OTHER): Payer: Medicare HMO | Admitting: Family Medicine

## 2022-04-08 ENCOUNTER — Encounter: Payer: Self-pay | Admitting: Family Medicine

## 2022-04-08 VITALS — BP 160/60 | HR 60 | Temp 98.8°F | Ht 62.0 in | Wt 145.4 lb

## 2022-04-08 DIAGNOSIS — M6740 Ganglion, unspecified site: Secondary | ICD-10-CM | POA: Diagnosis not present

## 2022-04-08 DIAGNOSIS — M19042 Primary osteoarthritis, left hand: Secondary | ICD-10-CM | POA: Diagnosis not present

## 2022-04-08 MED ORDER — METHYLPREDNISOLONE 4 MG PO TBPK: ORAL_TABLET | ORAL | 0 refills | Status: DC

## 2022-04-08 NOTE — Progress Notes (Signed)
Berma Harts T. Jaylanie Boschee, MD, [place] at [facility] [place], [address]  Phone: [phone]  FAX: [phone]  Catherine Green - 81 y.o. female  MRN [id number]  Date of Birth: [birth year]  Date: 04/08/2022  PCP: Tonia Ghent, MD  Referral: Tonia Ghent, MD  Chief Complaint  Patient presents with   Hand Pain    Left Pinky Finger    Subjective:   Catherine Green is a 81 y.o. very pleasant female patient with Body mass index is 26.59 kg/m. who presents with the following:  DIP OA.  She has pain at the DIP joints of the fourth and fifth digits with also present a mucoid cyst on the lateral aspect of the fifth DIP joint.  She also has some enlargement of multiple other DIP joints on both hands, but these are predominantly affected on the left hand.  They are not red, hot or warm.  She has not done any kind of recent injury or anything to exacerbate her ongoing symptoms.  Review of Systems is noted in the HPI, as appropriate  Objective:   BP (!) 160/60   Pulse 60   Temp 98.8 F (37.1 C) (Oral)   Ht '5\' 2"'$  (1.575 m)   Wt 145 lb 6 oz (65.9 kg)   SpO2 95%   BMI 26.59 kg/m   GEN: No acute distress; alert,appropriate. PULM: Breathing comfortably in no respiratory distress PSYCH: Normally interactive.   Active mucoid cyst on the left DIP digit on the fifth.  There is also some bogginess and synovitis in the fourth and fifth DIP joints on the left side.  She also has Heberden's nodes on multiple other digits throughout bilateral hands.  Laboratory and Imaging Data:  Assessment and Plan:     ICD-10-CM   1. Localized osteoarthritis of left hand  M19.042     2. Mucoid cyst, joint  M67.40      Acute on chronic hand osteoarthritis with exacerbation.  This is predominantly at the DIP joints.  There is a focal mucoid cyst on the left fifth DIP joint, not tried to explain the pathology and anatomy with the  patient.  She is limited on some of her oral medications that she can take, she is not able to tolerate NSAIDs, and she is unable to tolerate oral prednisone.  Think that is reasonable to try a Medrol Dosepak, as many people are able to tolerate this even if they are unable to tolerate oral prednisone.  If she has symptoms, then she will have to discontinue.  Medication Management during today's office visit: Meds ordered this encounter  Medications   methylPREDNISolone (MEDROL DOSEPAK) 4 MG TBPK tablet    Sig: Follow directions on Dose Pak: Day 1: 2 tablets before breakfast, 1 tablet after lunch and after supper, 2 tablets at bedtime Day 2: 1 tablet before breakfast, 1 tablet after lunch and after supper, 2 tablets at bedtime Day 3: 1 tablet before breakfast and 1 tablet after lunch, after supper, and at bedtime Day 4: 1 tablet before breakfast, after lunch, and at bedtime Day 5: 1 tablet before breakfast and at bedtime Day 6: 1 tablet before breakfast.    Dispense:  21 tablet    Refill:  0    Dispense 1 Methylprednisolone Dose Pak   Medications Discontinued During This Encounter  Medication Reason   azithromycin (ZITHROMAX) 250 MG tablet Completed Course    Orders placed  today for conditions managed today: No orders of the defined types were placed in this encounter.   Follow-up if needed: No follow-ups on file.  Dragon [employer] One speech-to-text software was used for transcription in this dictation.  Possible transcriptional errors can occur using Editor, commissioning.   Signed,  Maud Deed. Darice Vicario, MD   Outpatient Encounter Medications as of 04/08/2022  Medication Sig   albuterol (VENTOLIN HFA) 108 (90 Base) MCG/ACT inhaler Inhale 2 puffs into the lungs every 6 (six) hours as needed for wheezing or shortness of breath.   amLODipine (NORVASC) 2.5 MG tablet Take 0.5 tablets (1.25 mg total) by mouth daily.   aspirin 81 MG tablet Take 1 tablet (81 mg total) by mouth daily.   Coenzyme  Q10 (CO Q10) 100 MG CAPS Take 100 mg by mouth daily.   ezetimibe (ZETIA) 10 MG tablet TAKE ONE TABLET EVERY DAY   furosemide (LASIX) 20 MG tablet Take 1 tablet (20 mg total) by mouth daily.   gabapentin (NEURONTIN) 400 MG capsule TAKE 1 CAPSULE BY MOUTH EVERY 8 HOURS   ketoconazole (NIZORAL) 2 % shampoo Apply topically as directed.   methylPREDNISolone (MEDROL DOSEPAK) 4 MG TBPK tablet Follow directions on Dose Pak: Day 1: 2 tablets before breakfast, 1 tablet after lunch and after supper, 2 tablets at bedtime Day 2: 1 tablet before breakfast, 1 tablet after lunch and after supper, 2 tablets at bedtime Day 3: 1 tablet before breakfast and 1 tablet after lunch, after supper, and at bedtime Day 4: 1 tablet before breakfast, after lunch, and at bedtime Day 5: 1 tablet before breakfast and at bedtime Day 6: 1 tablet before breakfast.   metoprolol succinate (TOPROL XL) 25 MG 24 hr tablet Take 0.5 tablets (12.5 mg total) by mouth at bedtime.   omeprazole (PRILOSEC) 20 MG capsule Take 1 capsule (20 mg total) by mouth 2 (two) times daily before a meal.   potassium chloride (KLOR-CON) 10 MEQ tablet TAKE 1 TABLET BY MOUTH DAILY   rosuvastatin (CRESTOR) 20 MG tablet TAKE ONE TABLET BY MOUTH DAILY   sertraline (ZOLOFT) 100 MG tablet Take 0.5-1 tablets (50-100 mg total) by mouth daily.   vitamin B-12 (CYANOCOBALAMIN) 1000 MCG tablet Take 1 tablet (1,000 mcg total) by mouth daily.   [DISCONTINUED] azithromycin (ZITHROMAX) 250 MG tablet 2 tabs a day for 1 day and then 1 a day for 4 days.   No facility-administered encounter medications on file as of 04/08/2022.

## 2022-04-20 NOTE — Progress Notes (Unsigned)
Cardiology Office Note    Date:  04/21/2022   ID:  Catherine Green 03-Mar-1941, MRN [id number]  PCP:  Tonia Ghent, MD  Cardiologist:  Ida Rogue, MD  Electrophysiologist:  None   Chief Complaint: Follow-up  History of Present Illness:   Catherine Green is a 81 y.o. female with history of CAD status post three-vessel CABG in 2006, HFpEF, bilateral carotid artery disease status post bilateral CEA followed by vascular surgery, HTN, HLD, frequent PVCs, atrial tachycardia, multiple medication intolerances, back pain status post surgery, and prior tobacco use starting at age 62 and quitting at age 2 who presents for follow-up of hypertension.   Cardiac cath from 09/2007 demonstrated an atretic LIMA to LAD with patent vein grafts.   She was seen at her PCPs office in 10/2019 with dyspnea.  BNP mildly elevated at 300.  Her symptoms were felt to be likely multifactorial including volume overload and mild longstanding anemia.  She was started on Lasix 40 mg daily.  Echo in 11/2019 showed an EF of 65 to 77%, grade 3 diastolic dysfunction, normal RV systolic function and RV cavity size, and trivial mitral regurgitation.  Unable to estimate right heart pressures, IVC not documented as being dilated.  Small pleural effusion was noted.  Following this echo she was taking Lasix 40 mg twice daily with continued swelling.  Recheck at her PCPs office in late 12/2019 demonstrated her weight was down 6 pounds, however she continued to note bilateral pedal edema and abdominal bloating as the day progressed.  She continued to have some occasional dyspnea in the afternoons.  Her appetite remained down.  She was seen in the office on 02/06/2020 noting increased dyspnea, PND, 2-3 pillow orthopnea, and increased fatigue.  BP was elevated in the 939Q to 300P systolic.  Her weight was down 8 pounds in the prior 3 months with notes indicating she was missing meals.  She reported nausea when eating with associated  dysphagia.  Repeat BNP mildly improved at 239 with stable renal function.  She was taking Lasix 40 mg daily.  It was felt there was some component of venous insufficiency with her lower extremity swelling.  It was recommended she increase her Lasix to 40 mg twice daily for 1 week followed by 40 mg daily thereafter.  She was seen in 02/2020 noting improvement in her dyspnea following diuresis and had continued to take Lasix 40 mg twice daily since her visit in 01/2020.  Her weight is down another 6 pounds when compared to her last clinic visit.  She did note some positional dizziness and generalized fatigue.  She was orthostatic in the office that day.  Lasix was decreased to 40 mg daily.  Symptoms felt similar to her presentation leading up to her bypass.  In this setting, she underwent Lexiscan MPI on 03/15/2020 which showed no significant ischemia, normal wall motion, EF 76%, attenuation corrected images with aortic atherosclerosis and coronary artery calcifications, and was overall a low risk scan.  EKG showed PVCs.  CTA of the neck from 04/23/2020 showed no dissection or pseudoaneurysm without interval growth of remodeling/fusiform aneurysm within the right internal carotid artery, 30% left ICA stenosis, and 70% stenosis of the brachiocephalic origin.  There was 40% subclavian artery stenosis.  She was seen in the office on 04/29/2020 noting her dizziness persisted.  Her weight was stable.  She had further decreased her Lasix to 20 mg daily in the setting of ongoing dizziness.  She denied  any shortness of breath or lower extremity swelling.  Orthostatic vital signs remained positive at that visit.  Imdur was discontinued and carvedilol was decreased to 3.125 mg twice daily.  Following this, she contacted our office in 05/2020, noting continued orthostasis, and in this setting carvedilol was held.  She was seen in the office on 05/29/2020, and continued to note positional dizziness.  She was no longer on carvedilol or  Imdur though was taking Lasix 40 mg daily.  Home BP readings were predominantly in the 102H to 852D systolic.  Her dizziness was noted to date back at least to 02/2018.  She declined head CT.  She was advised to follow-up with her PCP.  It was again recommended her Lasix be decreased to 20 mg daily.  She was hypertensive when lying for orthostatic BP with orthostatics continuing to be mildly positive.  Renal artery ultrasound showed no evidence of renal artery stenosis.  She was seen in the office in 07/2020 noting some improvement in her chronic dizziness.  BP readings ranged from the 782U to 235 systolic with most heart rates in the 60s bpm. Carotid artery ultrasound in 07/2020, showed 1-39% bilateral ICA stenosis, antegrade flow along the vertebral arteries bilaterally, normal flow hemodynamics in the bilateral subclavian arteries. Bilateral ABI's were normal in 10/2020.    She was seen in 10/2020 and was doing well from a cardiac perspective.  She did continue to note a significant improvement in her chronic dizziness when having permissive BP.  Home BP readings were in the 150s to 160s for the most part with occasional readings in the 361W systolic.  She was frequently taking prn Coreg, and in this setting, it was recommended she take Coreg 3.125 mg bid.    She was seen on 01/08/2021 noting a several week history of elevated BP readings, particularly in the morning with many readings in the 160s to 431V mmHg systolic.  ReDs vest was elevated at 39%.  It was recommended she start amlodipine 5 mg nightly and continue carvedilol 3.125 mg twice daily.  Her Lasix was titrated to 40 mg daily for 3 days.  Labs obtained at that time showed a BNP of 163.  She underwent echo on 01/10/2021 which showed an EF of 60 to 65%, no regional wall motion abnormalities, indeterminate LV diastolic function parameters, normal RV systolic function and ventricular cavity size, and moderate mitral regurgitation.  She was seen in the  office on 01/14/2021 with stable weight and BP of 170/64 in the office.  She continued to note unchanged longstanding lightheadedness and dizziness.  She remained very anxious.  Her shortness of breath did improve some following increased diuresis.  She did note some palpitations.  Given prior noted orthostatic hypotension permissive hypertension was recommended and she was continued on carvedilol and amlodipine.  Lasix was adjusted to 40 mg alternating with 20 mg every other day.  She underwent Zio patch which showed a predominant rhythm of sinus with an average heart rate of 62 bpm (range 48 to 135 bpm), 3 runs of SVT/atrial tachycardia were noted with the fastest interval lasting 9 beats with a maximal rate of 135 bpm and the longest interval lasting 14 seconds with an average rate of 102 bpm.  There were frequent PVCs representing a 13.4% burden.  No patient triggered events were observed.  Given this, it was recommended amlodipine be discontinued and carvedilol be titrated to 6.25 mg twice daily to improve with PVC burden.   She was seen  in the office in 01/2021, by her primary cardiologist, and noted rare dizziness along with increased stress.  Weight was stable.  No changes were made.   She was seen in the office on 08/08/2021 and was grieving the loss of her sister, daughter, and granddaughter.  With this, she noted intermittent episodes of shortness of breath, particularly when upset.  With her grief, she had not had much of an appetite.  Her weight was down 5 pounds when compared to her prior clinic visit.  She had been started on Zoloft by her PCP, though had not noted an improvement in her grief.  She was without symptoms of angina or decompensation.  She underwent echo on 09/15/2021 which demonstrated an EF of 60 to 65%, no regional wall motion abnormalities, grade 2 diastolic dysfunction, normal RV systolic function and ventricular cavity size, mildly elevated PASP estimated at 43.7 mmHg, moderate  mitral regurgitation, and an estimated right atrial pressure of 3 mmHg.   She was seen in the office on 09/25/2021 and continued to feel poorly.  She noted intermittent substernal chest tightness that radiated to the left upper arm and would last for several minutes with spontaneous resolution.  With these episodes, she noted increased fatigue.  This pain did not feel like her prior angina.  She also continued to note positional dizziness and generalized malaise/fatigue.  She noted no dizziness in the way she felt on days where she took 20 mg of Lasix versus 40 mg of Lasix.  Her previously noted weight loss had stabilized.  She continued to grieve.  Given symptoms, she underwent Lexiscan MPI on 10/01/2021 which showed no evidence of ischemia and was overall low risk.  Coronary artery calcifications were noted in the LAD and RCA territories along with a sending and descending aortic atherosclerosis.   She was seen in the office on 11/19/2021 and was without symptoms of angina or decompensation.  She continued to note on changed, longstanding dizziness that appeared to be exacerbated with positional changes and eye movement.  Episodes at times were precipitated by a headache.  She also felt like the symptoms came on after taking carvedilol.  She was orthostatic in the office leading her furosemide to be decreased to 20 mg daily.  At her request, we discontinued carvedilol.  She declined replacement medication.  She contacted our office 2 days later noting elevated BP with a headache.  She was subsequently started on hydralazine 25 mg 3 times daily.  She subsequently called on 12/04/2021 complaining of no appetite, nausea, vomiting, and "not feeling well.  Hydralazine was discontinued and she was started on amlodipine 2.5 mg nightly.   She followed up with her PCP on 01/02/2022 noting generalized weakness with recommendation to hold amlodipine.  She was last seen in the office on 01/19/2022 and was without symptoms of  angina or decompensation.  She continued to note unchanged longstanding generalized malaise, fatigue, and dizziness.  Blood pressure readings at home ranged from 120 to 448 mmHg systolic.  She did have an isolated reading in the 185U systolic.  She remained on amlodipine 2.5 mg nightly.  She discontinued BuSpar that was recently started by her PCP as she felt like this made her feel like a "zombie."  No significant lower extremity swelling or orthopnea.  She did continue to feel like she is depressed following recent losses in her family.  Amlodipine was transitioned from the evening to the morning hours and Toprol-XL 12.5 mg was started nightly.  She  comes in doing very well from a cardiac perspective and is without symptoms of angina or decompensation.  She is feeling better than she has in quite a few years.  No dyspnea, dizziness, presyncope, or syncope.  She does occasionally still feel unsteady on her feet, though this is improved.  Her blood pressure at home is typically in the 390Z systolic with a rare reading of 009 and 233 systolic.  She is tolerating cardiac medications without issues.  No significant lower extremity swelling or orthopnea.  She indicates that she will be seeing GI later this month for dysphagia.  Overall, she is very pleased with her improvement and does not have any active cardiac issues or concerns at this time.   Labs independently reviewed: 07/2021 - potassium 4.3, BUN 18, serum creatinine 0.76, Hgb 11.7, PLT 314 03/2021 - albumin 4.3, AST/ALT normal 01/2021 - TC 110, TG 80, HDL 66, LDL 28, A1c 5.8, BNP 137 07/2020 - TSH normal  Past Medical History:  Diagnosis Date   Allergy    hay fever   Anemia    Anxiety    Back pain    Carotid artery occlusion    Cerebrovascular disease    extracranial; occlusive   Chicken pox    Coronary artery disease    Depression    Dizziness    Dizziness    DVT (deep venous thrombosis) (HCC)    Fainting spell    GERD  (gastroesophageal reflux disease)    Headache    Heart murmur    Hyperlipidemia    Hypertension    Leg pain    PONV (postoperative nausea and vomiting)    severe nausea and vomiting   Pre-syncope    PVD (peripheral vascular disease) ([facility])    endarterectomy by [name]   Seasonal allergies    Shortness of breath dyspnea    wth ambulation at times   Swelling of both ankles    and abdomen; takes Lasix when needed   Thrombophlebitis    following childbirth   Ulcer     Past Surgical History:  Procedure Laterality Date   APPENDECTOMY     BACK SURGERY  01/14/16   BREAST SURGERY     saline implants   CARDIAC CATHETERIZATION     CAROTID ENDARTERECTOMY  1992   CAROTID ENDARTERECTOMY Right February 13, 2005   Re-do Right CE   CATARACT EXTRACTION, BILATERAL  2013   CHOLECYSTECTOMY     CHOLECYSTECTOMY, LAPAROSCOPIC  04/02/14   Dr. Rochel Brome   CORONARY ARTERY BYPASS GRAFT  2006   x3 Dr. Prescott Gum   ENDARTERECTOMY Left 07/20/2014   Procedure: ENDARTERECTOMY CAROTID-LEFT;  Surgeon: Rosetta Posner, MD;  Location: [facility];  Service: Vascular;  Laterality: Left;   EYE SURGERY Bilateral Feb. 2013   Cataract Left eye   SEPTOPLASTY     with bilateral inferior turbinate reductions   SPINE SURGERY  12/2015   TOTAL ABDOMINAL HYSTERECTOMY     UPPER GASTROINTESTINAL ENDOSCOPY     had polyps removed from esophagus   UPPER GI ENDOSCOPY      Current Medications: Current Meds  Medication Sig   albuterol (VENTOLIN HFA) 108 (90 Base) MCG/ACT inhaler Inhale 2 puffs into the lungs every 6 (six) hours as needed for wheezing or shortness of breath.   amLODipine (NORVASC) 2.5 MG tablet Take 0.5 tablets (1.25 mg total) by mouth daily.   aspirin 81 MG tablet Take 1 tablet (81 mg total) by mouth daily.  Coenzyme Q10 (CO Q10) 100 MG CAPS Take 100 mg by mouth daily.   ezetimibe (ZETIA) 10 MG tablet TAKE ONE TABLET EVERY DAY   furosemide (LASIX) 20 MG tablet Take 1 tablet (20 mg total) by mouth daily.    gabapentin (NEURONTIN) 400 MG capsule TAKE 1 CAPSULE BY MOUTH EVERY 8 HOURS   ketoconazole (NIZORAL) 2 % shampoo Apply topically as directed.   methylPREDNISolone (MEDROL DOSEPAK) 4 MG TBPK tablet Follow directions on Dose Pak: Day 1: 2 tablets before breakfast, 1 tablet after lunch and after supper, 2 tablets at bedtime Day 2: 1 tablet before breakfast, 1 tablet after lunch and after supper, 2 tablets at bedtime Day 3: 1 tablet before breakfast and 1 tablet after lunch, after supper, and at bedtime Day 4: 1 tablet before breakfast, after lunch, and at bedtime Day 5: 1 tablet before breakfast and at bedtime Day 6: 1 tablet before breakfast.   omeprazole (PRILOSEC) 20 MG capsule Take 1 capsule (20 mg total) by mouth 2 (two) times daily before a meal.   potassium chloride (KLOR-CON) 10 MEQ tablet TAKE 1 TABLET BY MOUTH DAILY   rosuvastatin (CRESTOR) 20 MG tablet TAKE ONE TABLET BY MOUTH DAILY   sertraline (ZOLOFT) 100 MG tablet Take 0.5-1 tablets (50-100 mg total) by mouth daily.   vitamin B-12 (CYANOCOBALAMIN) 1000 MCG tablet Take 1 tablet (1,000 mcg total) by mouth daily.    Allergies:   Lyrica [pregabalin], Hydrocodone, Ivp dye [iodinated contrast media], Lipitor [atorvastatin], Methylprednisolone, Oxycodone hcl, Penicillin g, Shellfish allergy, Buspar [buspirone], Clavulanic acid, Prednisone, Augmentin [amoxicillin-pot clavulanate], Codeine, Cymbalta [duloxetine hcl], Demerol, Doxycycline, Effexor [venlafaxine], Hydrochlorothiazide, Hydroxyzine, Metoprolol, Protonix [pantoprazole sodium], and Trazodone and nefazodone   Social History   Socioeconomic History   Marital status: Married    Spouse name: Jeneen Rinks    Number of children: 1   Years of education: Not on file   Highest education level: 11th grade  Occupational History    Comment: Retired   Tobacco Use   Smoking status: Former    Packs/day: 0.30    Years: 15.00    Total pack years: 4.50    Types: Cigarettes    Quit date: 08/31/1996     Years since quitting: 25.6   Smokeless tobacco: Never  Vaping Use   Vaping Use: Never used  Substance and Sexual Activity   Alcohol use: No    Alcohol/week: 0.0 standard drinks of alcohol   Drug use: No   Sexual activity: Not Currently  Other Topics Concern   Not on file  Social History Narrative   Lives with husband   Work - retired Emergency planning/management officer   Diet - healthy   Right handed    Caffeine- 1 cup per day.   Social Determinants of Health   Financial Resource Strain: Low Risk  (02/18/2022)   Overall Financial Resource Strain (CARDIA)    Difficulty of Paying Living Expenses: Not very hard  Food Insecurity: No Food Insecurity (02/18/2022)   Hunger Vital Sign    Worried About Running Out of Food in the Last Year: Never true    Ran Out of Food in the Last Year: Never true  Transportation Needs: Not on file  Physical Activity: Not on file  Stress: Not on file  Social Connections: Not on file     Family History:  The patient's family history includes Aneurysm in her brother and mother; Birth defects in her brother; Bleeding Disorder in her mother and sister; Cancer in her sister; Cirrhosis  in her father; Deep vein thrombosis in her brother; Heart attack in her father; Heart disease in her brother, father, mother, and sister; Hyperlipidemia in her brother, daughter, father, mother, and sister; Hypertension in her brother, daughter, father, mother, sister, and another family member; Peripheral vascular disease in her daughter; Varicose Veins in her mother and sister. There is no history of Breast cancer or Colon cancer.  ROS:   12-point review of systems is negative unless otherwise noted in the HPI.   EKGs/Labs/Other Studies Reviewed:    Studies reviewed were summarized above. The additional studies were reviewed today:  2D echo 11/2019: 1. Left ventricular ejection fraction, by estimation, is 65 to 70%. The  left ventricle has normal function. Left ventricular endocardial border   not optimally defined to evaluate regional wall motion. Left ventricular  diastolic parameters are consistent  with Grade III diastolic dysfunction (restrictive). Elevated left atrial  pressure.   2. Right ventricular systolic function is normal. The right ventricular  size is normal. Tricuspid regurgitation signal is inadequate for assessing  PA pressure.   3. The mitral valve is grossly normal. Trivial mitral valve  regurgitation.   4. The aortic valve was not well visualized. Aortic valve regurgitation  is not visualized. No aortic stenosis is present.   5. The inferior vena cava is normal in size with greater than 50%  respiratory variability, suggesting right atrial pressure of 3 mmHg. __________   Carlton Adam MPI 02/2020: Pharmacological myocardial perfusion imaging study with no significant  ischemia Normal wall motion, EF estimated at 76% No EKG changes concerning for ischemia at peak stress or in recovery. Resting EKG with PVCs Attenuation correction images with aortic atherosclerosis and coronary calcification Low risk scan ___________   2D echo 01/10/2021:  1. Left ventricular ejection fraction, by estimation, is 60 to 65%. The  left ventricle has normal function. The left ventricle has no regional  wall motion abnormalities. Left ventricular diastolic parameters are  indeterminate.   2. Right ventricular systolic function is normal. The right ventricular  size is normal. Tricuspid regurgitation signal is inadequate for assessing  PA pressure.   3. The mitral valve was not well visualized. Moderate mitral valve  regurgitation.  __________   Elwyn Reach patch 12/2020: Predominant underlying rhythm was Sinus Rhythm.  Patient had a min HR of 48 bpm, max HR of 135 bpm, and avg HR of 62 bpm.   3 Supraventricular Tachycardia/atrial tachycardia runs occurred, the run with the fastest interval lasting 9 beats with a max rate of 135 bpm, the  longest lasting 14.0 secs with an avg rate  of 102 bpm.     Isolated SVEs were rare (<1.0%), SVE Couplets were rare (<1.0%), and SVE Triplets were rare  (<1.0%). Isolated VEs were frequent (13.4%, 115534), VE Couplets were rare (<1.0%, 84), and VE Triplets were rare (<1.0%, 1).  Ventricular Bigeminy and Trigeminy were present.   No patient triggered events noted __________   2D echo 09/15/2021: 1. Left ventricular ejection fraction, by estimation, is 60 to 65%. The  left ventricle has normal function. The left ventricle has no regional  wall motion abnormalities. Left ventricular diastolic parameters are  consistent with Grade II diastolic  dysfunction (pseudonormalization).   2. Right ventricular systolic function is normal. The right ventricular  size is normal. There is mildly elevated pulmonary artery systolic  pressure. The estimated right ventricular systolic pressure is 70.6 mmHg.   3. The mitral valve is normal in structure. Moderate mitral valve  regurgitation.  No evidence of mitral stenosis.   4. The aortic valve is normal in structure. Aortic valve regurgitation is  not visualized. No aortic stenosis is present.   5. The inferior vena cava is normal in size with greater than 50%  respiratory variability, suggesting right atrial pressure of 3 mmHg.   Comparison(s): LVEF 60-65%, Moderate MR. __________   Carlton Adam MPI 10/01/2021:   The study is low risk.   No ST deviation was noted.   Left ventricular function is normal. Calculated LVEF is 79%.   There is no evidence for ischemia.   LAD and RCA calcifications noted.   Ascending and descending aorta calcifications noted.   EKG:  EKG is ordered today.  The EKG ordered today demonstrates NSR, 61 bpm, nonspecific ST-T changes  Recent Labs: 08/08/2021: BUN 18; Creatinine, Ser 0.76; Hemoglobin 11.7; Platelets 314; Potassium 4.3; Sodium 138  Recent Lipid Panel    Component Value Date/Time   CHOL 110 02/26/2021 1124   TRIG 80 02/26/2021 1124   HDL 66 02/26/2021 1124    CHOLHDL 1.7 02/26/2021 1124   CHOLHDL 2 02/01/2019 0907   VLDL 21.8 02/01/2019 0907   LDLCALC 28 02/26/2021 1124   LDLDIRECT 139.1 04/11/2012 1131    PHYSICAL EXAM:    VS:  BP (!) 158/64 (BP Location: Left Arm, Patient Position: Sitting, Cuff Size: Normal)   Pulse 61   Ht '5\' 2"'$  (1.575 m)   Wt 142 lb 9.6 oz (64.7 kg)   SpO2 96%   BMI 26.08 kg/m   BMI: Body mass index is 26.08 kg/m.  Physical Exam Vitals reviewed.  Constitutional:      Appearance: She is well-developed.  HENT:     Head: Normocephalic and atraumatic.  Eyes:     General:        Right eye: No discharge.        Left eye: No discharge.  Neck:     Vascular: No JVD.  Cardiovascular:     Rate and Rhythm: Normal rate and regular rhythm.     Pulses:          Carotid pulses are  on the right side with bruit and  on the left side with bruit.      Posterior tibial pulses are 2+ on the right side and 2+ on the left side.     Heart sounds: S1 normal and S2 normal. Heart sounds not distant. No midsystolic click and no opening snap. Murmur heard.     High-pitched blowing holosystolic murmur is present with a grade of 2/6 at the apex.     No friction rub.  Pulmonary:     Effort: Pulmonary effort is normal. No respiratory distress.     Breath sounds: Normal breath sounds. No decreased breath sounds, wheezing or rales.  Chest:     Chest wall: No tenderness.  Abdominal:     General: There is no distension.  Musculoskeletal:     Cervical back: Normal range of motion.     Right lower leg: No edema.     Left lower leg: No edema.  Skin:    General: Skin is warm and dry.     Nails: There is no clubbing.  Neurological:     Mental Status: She is alert and oriented to person, place, and time.  Psychiatric:        Speech: Speech normal.        Behavior: Behavior normal.        Thought Content: Thought  content normal.        Judgment: Judgment normal.     Wt Readings from Last 3 Encounters:  04/21/22 142 lb 9.6 oz  (64.7 kg)  04/08/22 145 lb 6 oz (65.9 kg)  02/26/22 144 lb (65.3 kg)     ASSESSMENT & PLAN:   HFpEF/pulmonary hypertension: She is euvolemic and well compensated and remains on low-dose furosemide 20 mg daily.  Recent labs stable.  Labile hypertension with orthostatic hypotension: Blood pressure is reasonably controlled in the office.  She will need some degree of permissive hypertension.  Blood pressure has been well controlled at home.  She remains on Toprol-XL 12.5 mg nightly and amlodipine 2.5 mg in the morning.  Continue compression stockings and adequate hydration.  CAD status post CABG without angina: She is without symptoms of angina or decompensation.  Recent Lexiscan MPI was without evidence of ischemia and overall low risk.  Continue aggressive risk factor modification and secondary prevention including aspirin, rosuvastatin, ezetimibe, and metoprolol.  No indication for further ischemic testing at this time.  Frequent PVCs/atrial tachycardia: Quiescent.  She is tolerating Toprol-XL 12.5 mg nightly.  Mitral regurgitation: Stable on recent echo.  Look to repeat echo at next visit.  HLD: LDL 28 in 01/2021.  She remains on rosuvastatin and ezetimibe.  Carotid artery disease: 1 to 39% bilateral ICA stenosis on imaging in 08/2021.  She remains on aspirin and statin.  Followed by vascular surgery.  Dizziness/malaise/fatigue: Longstanding issues, though improved more recently.  Preoperative cardiac risk stratification.  She will be evaluated by GI later this month for dysphagia.  If she does need an endoscopy, she may proceed at an overall low risk from a cardiac perspective without further cardiac testing.  Able to achieve greater than 4 METS without cardiac limitation.   Disposition: F/u with Dr. Rockey Situ or an APP in 6 months.   Medication Adjustments/Labs and Tests Ordered: Current medicines are reviewed at length with the patient today.  Concerns regarding medicines are outlined  above. Medication changes, Labs and Tests ordered today are summarized above and listed in the Patient Instructions accessible in Encounters.   SignedChristell Faith, PA-C 04/21/2022 10:44 AM     [facility] [address] [place] [address] [place], [place] [address] [phone]

## 2022-04-21 ENCOUNTER — Ambulatory Visit: Payer: Medicare HMO | Admitting: Physician Assistant

## 2022-04-21 ENCOUNTER — Encounter: Payer: Self-pay | Admitting: Physician Assistant

## 2022-04-21 VITALS — BP 158/64 | HR 61 | Ht 62.0 in | Wt 142.6 lb

## 2022-04-21 DIAGNOSIS — I25118 Atherosclerotic heart disease of native coronary artery with other forms of angina pectoris: Secondary | ICD-10-CM

## 2022-04-21 DIAGNOSIS — I10 Essential (primary) hypertension: Secondary | ICD-10-CM

## 2022-04-21 DIAGNOSIS — I779 Disorder of arteries and arterioles, unspecified: Secondary | ICD-10-CM

## 2022-04-21 DIAGNOSIS — I272 Pulmonary hypertension, unspecified: Secondary | ICD-10-CM | POA: Diagnosis not present

## 2022-04-21 DIAGNOSIS — Z01810 Encounter for preprocedural cardiovascular examination: Secondary | ICD-10-CM | POA: Diagnosis not present

## 2022-04-21 DIAGNOSIS — I5032 Chronic diastolic (congestive) heart failure: Secondary | ICD-10-CM | POA: Diagnosis not present

## 2022-04-21 DIAGNOSIS — I340 Nonrheumatic mitral (valve) insufficiency: Secondary | ICD-10-CM | POA: Diagnosis not present

## 2022-04-21 DIAGNOSIS — I4719 Other supraventricular tachycardia: Secondary | ICD-10-CM

## 2022-04-21 DIAGNOSIS — E785 Hyperlipidemia, unspecified: Secondary | ICD-10-CM | POA: Diagnosis not present

## 2022-04-21 DIAGNOSIS — Z951 Presence of aortocoronary bypass graft: Secondary | ICD-10-CM

## 2022-04-21 DIAGNOSIS — I951 Orthostatic hypotension: Secondary | ICD-10-CM | POA: Diagnosis not present

## 2022-04-21 DIAGNOSIS — I493 Ventricular premature depolarization: Secondary | ICD-10-CM

## 2022-04-21 DIAGNOSIS — I471 Supraventricular tachycardia: Secondary | ICD-10-CM | POA: Diagnosis not present

## 2022-04-21 NOTE — Patient Instructions (Signed)
Medication Instructions:  Your physician recommends that you continue on your current medications as directed. Please refer to the Current Medication list given to you today.  *If you need a refill on your cardiac medications before your next appointment, please call your pharmacy*   Lab Work: None  If you have labs (blood work) drawn today and your tests are completely normal, you will receive your results only by: [place] (if you have MyChart) OR A paper copy in the mail If you have any lab test that is abnormal or we need to change your treatment, we will call you to review the results.   Testing/Procedures: None   Follow-Up: At [facility], you and your health needs are our priority.  As part of our continuing mission to provide you with exceptional heart care, we have created designated Provider Care Teams.  These Care Teams include your primary Cardiologist (physician) and Advanced Practice Providers (APPs -  Physician Assistants and Nurse Practitioners) who all work together to provide you with the care you need, when you need it.   Your next appointment:   6 month(s)  The format for your next appointment:   In Person  Provider:   Ida Rogue, MD or Christell Faith, PA-C      Important Information About Sugar

## 2022-04-29 ENCOUNTER — Other Ambulatory Visit (HOSPITAL_COMMUNITY): Payer: Self-pay | Admitting: Gastroenterology

## 2022-04-29 ENCOUNTER — Other Ambulatory Visit: Payer: Self-pay | Admitting: Gastroenterology

## 2022-04-29 DIAGNOSIS — R1310 Dysphagia, unspecified: Secondary | ICD-10-CM | POA: Diagnosis not present

## 2022-04-30 DIAGNOSIS — R490 Dysphonia: Secondary | ICD-10-CM | POA: Diagnosis not present

## 2022-04-30 DIAGNOSIS — R1310 Dysphagia, unspecified: Secondary | ICD-10-CM | POA: Diagnosis not present

## 2022-05-06 ENCOUNTER — Other Ambulatory Visit: Payer: Self-pay | Admitting: Family Medicine

## 2022-05-06 ENCOUNTER — Other Ambulatory Visit: Payer: Self-pay | Admitting: Cardiovascular Disease

## 2022-05-06 NOTE — Telephone Encounter (Signed)
Refill request Sertraline Last office visit 04/08/22 acute Pharmacy is requesting a refill for #[phone], see note from pharmacy

## 2022-05-06 NOTE — Telephone Encounter (Signed)
Sent. Thanks.   

## 2022-05-07 ENCOUNTER — Other Ambulatory Visit: Payer: Self-pay | Admitting: Physician Assistant

## 2022-05-11 ENCOUNTER — Ambulatory Visit (INDEPENDENT_AMBULATORY_CARE_PROVIDER_SITE_OTHER): Payer: Medicare HMO

## 2022-05-11 VITALS — Ht 62.0 in | Wt 140.0 lb

## 2022-05-11 DIAGNOSIS — Z0000 Encounter for general adult medical examination without abnormal findings: Secondary | ICD-10-CM | POA: Diagnosis not present

## 2022-05-11 NOTE — Progress Notes (Signed)
I connected with Catherine Green today by telephone and verified that I am speaking with the correct person using two identifiers. Location patient: home Location provider: work Persons participating in the virtual visit: Catherine Green, Ange LPN.   I discussed the limitations, risks, security and privacy concerns of performing an evaluation and management service by telephone and the availability of in person appointments. I also discussed with the patient that there may be a patient responsible charge related to this service. The patient expressed understanding and verbally consented to this telephonic visit.    Interactive audio and video telecommunications were attempted between this provider and patient, however failed, due to patient having technical difficulties OR patient did not have access to video capability.  We continued and completed visit with audio only.     Vital signs may be patient reported or missing.  Subjective:   Catherine Green is a 81 y.o. female who presents for Medicare Annual (Subsequent) preventive examination.  Review of Systems     Cardiac Risk Factors include: advanced age (>94mn, >>49women);hypertension     Objective:    Today's Vitals   05/11/22 0826 05/11/22 0827  Weight: 140 lb (63.5 kg)   Height: '5\' 2"'$  (1.575 m)   PainSc:  5    Body mass index is 25.61 kg/m.     05/11/2022    8:33 AM 06/27/2019    9:55 AM 01/31/2019    8:08 AM 01/11/2018    9:26 AM 02/17/2017    2:12 PM 01/07/2017   11:01 AM 02/18/2016   12:38 PM  Advanced Directives  Does Patient Have a Medical Advance Directive? Yes Yes Yes Yes Yes Yes Yes  Type of AParamedicof ABrooknealLiving will HWyomingLiving will HVeronaLiving will HTarentumLiving will HDenhamLiving will HLakeshore Gardens-Hidden AcresLiving will Living will;Healthcare Power of Attorney  Does patient want to  make changes to medical advance directive?  No - Patient declined No - Patient declined      Copy of HLacledein Chart? No - copy requested  No - copy requested No - copy requested  No - copy requested No - copy requested  Would patient like information on creating a medical advance directive?   No - Patient declined        Current Medications (verified) Outpatient Encounter Medications as of 05/11/2022  Medication Sig   albuterol (VENTOLIN HFA) 108 (90 Base) MCG/ACT inhaler Inhale 2 puffs into the lungs every 6 (six) hours as needed for wheezing or shortness of breath.   amLODipine (NORVASC) 2.5 MG tablet Take 0.5 tablets (1.25 mg total) by mouth daily.   aspirin 81 MG tablet Take 1 tablet (81 mg total) by mouth daily.   Coenzyme Q10 (CO Q10) 100 MG CAPS Take 100 mg by mouth daily.   ezetimibe (ZETIA) 10 MG tablet TAKE 1 TABLET BY MOUTH DAILY   furosemide (LASIX) 20 MG tablet Take 1 tablet (20 mg total) by mouth daily.   gabapentin (NEURONTIN) 400 MG capsule TAKE 1 CAPSULE BY MOUTH EVERY 8 HOURS   ketoconazole (NIZORAL) 2 % shampoo Apply topically as directed.   metoprolol succinate (TOPROL XL) 25 MG 24 hr tablet Take 0.5 tablets (12.5 mg total) by mouth at bedtime.   omeprazole (PRILOSEC) 20 MG capsule Take 1 capsule (20 mg total) by mouth 2 (two) times daily before a meal.   potassium chloride (KLOR-CON) 10 MEQ  tablet TAKE 1 TABLET BY MOUTH DAILY   rosuvastatin (CRESTOR) 20 MG tablet TAKE ONE TABLET BY MOUTH DAILY   sertraline (ZOLOFT) 100 MG tablet TAKE ONE AND A HALF TABLETS BY MOUTH EVERY DAY   vitamin B-12 (CYANOCOBALAMIN) 1000 MCG tablet Take 1 tablet (1,000 mcg total) by mouth daily.   methylPREDNISolone (MEDROL DOSEPAK) 4 MG TBPK tablet Follow directions on Dose Pak: Day 1: 2 tablets before breakfast, 1 tablet after lunch and after supper, 2 tablets at bedtime Day 2: 1 tablet before breakfast, 1 tablet after lunch and after supper, 2 tablets at bedtime Day 3: 1  tablet before breakfast and 1 tablet after lunch, after supper, and at bedtime Day 4: 1 tablet before breakfast, after lunch, and at bedtime Day 5: 1 tablet before breakfast and at bedtime Day 6: 1 tablet before breakfast. (Patient not taking: Reported on 05/11/2022)   No facility-administered encounter medications on file as of 05/11/2022.    Allergies (verified) Lyrica [pregabalin], Hydrocodone, Ivp dye [iodinated contrast media], Lipitor [atorvastatin], Methylprednisolone, Oxycodone hcl, Penicillin g, Shellfish allergy, Buspar [buspirone], Clavulanic acid, Prednisone, Augmentin [amoxicillin-pot clavulanate], Codeine, Cymbalta [duloxetine hcl], Demerol, Doxycycline, Effexor [venlafaxine], Hydrochlorothiazide, Hydroxyzine, Metoprolol, Protonix [pantoprazole sodium], and Trazodone and nefazodone   History: Past Medical History:  Diagnosis Date   Allergy    hay fever   Anemia    Anxiety    Back pain    Carotid artery occlusion    Cerebrovascular disease    extracranial; occlusive   Chicken pox    Coronary artery disease    Depression    Dizziness    Dizziness    DVT (deep venous thrombosis) (HCC)    Fainting spell    GERD (gastroesophageal reflux disease)    Headache    Heart murmur    Hyperlipidemia    Hypertension    Leg pain    PONV (postoperative nausea and vomiting)    severe nausea and vomiting   Pre-syncope    PVD (peripheral vascular disease) ([facility])    endarterectomy by [name]   Seasonal allergies    Shortness of breath dyspnea    wth ambulation at times   Swelling of both ankles    and abdomen; takes Lasix when needed   Thrombophlebitis    following childbirth   Ulcer    Past Surgical History:  Procedure Laterality Date   APPENDECTOMY     BACK SURGERY  01/14/16   BREAST SURGERY     saline implants   CARDIAC CATHETERIZATION     CAROTID ENDARTERECTOMY  1992   CAROTID ENDARTERECTOMY Right February 13, 2005   Re-do Right CE   CATARACT EXTRACTION, BILATERAL  2013    CHOLECYSTECTOMY     CHOLECYSTECTOMY, LAPAROSCOPIC  04/02/14   Dr. Rochel Brome   CORONARY ARTERY BYPASS GRAFT  2006   x3 Dr. Prescott Gum   ENDARTERECTOMY Left 07/20/2014   Procedure: ENDARTERECTOMY CAROTID-LEFT;  Surgeon: Rosetta Posner, MD;  Location: [facility];  Service: Vascular;  Laterality: Left;   EYE SURGERY Bilateral Feb. 2013   Cataract Left eye   SEPTOPLASTY     with bilateral inferior turbinate reductions   SPINE SURGERY  12/2015   TOTAL ABDOMINAL HYSTERECTOMY     UPPER GASTROINTESTINAL ENDOSCOPY     had polyps removed from esophagus   UPPER GI ENDOSCOPY     Family History  Problem Relation Age of Onset   Aneurysm Mother        brain   Heart disease  Mother        Aneyursm    Hyperlipidemia Mother    Hypertension Mother    Varicose Veins Mother    Bleeding Disorder Mother    Heart disease Father    Cirrhosis Father    Heart attack Father    Hyperlipidemia Father    Hypertension Father    Cancer Sister        lung   Heart disease Sister        Aneurysm   Hyperlipidemia Sister    Hypertension Sister    Varicose Veins Sister    Bleeding Disorder Sister    Heart disease Brother        Before age 58   Aneurysm Brother    Deep vein thrombosis Brother    Birth defects Brother    Hyperlipidemia Brother    Hypertension Brother    Peripheral vascular disease Daughter    Hyperlipidemia Daughter    Hypertension Daughter    Hypertension Other    Breast cancer Neg Hx    Colon cancer Neg Hx    Social History   Socioeconomic History   Marital status: Married    Spouse name: Jeneen Rinks    Number of children: 1   Years of education: Not on file   Highest education level: 11th grade  Occupational History    Comment: Retired   Tobacco Use   Smoking status: Former    Packs/day: 0.30    Years: 15.00    Total pack years: 4.50    Types: Cigarettes    Quit date: 08/31/1996    Years since quitting: 25.7   Smokeless tobacco: Never  Vaping Use   Vaping Use: Never used   Substance and Sexual Activity   Alcohol use: No    Alcohol/week: 0.0 standard drinks of alcohol   Drug use: No   Sexual activity: Not Currently  Other Topics Concern   Not on file  Social History Narrative   Lives with husband   Work - retired Emergency planning/management officer   Diet - healthy   Right handed    Caffeine- 1 cup per day.   Social Determinants of Health   Financial Resource Strain: Low Risk  (05/11/2022)   Overall Financial Resource Strain (CARDIA)    Difficulty of Paying Living Expenses: Not hard at all  Food Insecurity: No Food Insecurity (05/11/2022)   Hunger Vital Sign    Worried About Running Out of Food in the Last Year: Never true    Ran Out of Food in the Last Year: Never true  Transportation Needs: No Transportation Needs (05/11/2022)   PRAPARE - Hydrologist (Medical): No    Lack of Transportation (Non-Medical): No  Physical Activity: Inactive (05/11/2022)   Exercise Vital Sign    Days of Exercise per Week: 0 days    Minutes of Exercise per Session: 0 min  Stress: No Stress Concern Present (05/11/2022)   [place]    Feeling of Stress : Not at all  Social Connections: Not on file    Tobacco Counseling Counseling given: Not Answered   Clinical Intake:  Pre-visit preparation completed: Yes  Pain : 0-10 Pain Score: 5  Pain Type: Acute pain Pain Location: Finger (Comment which one) (pinky) Pain Orientation: Left Pain Descriptors / Indicators: Throbbing Pain Onset: More than a month ago Pain Frequency: Constant     Nutritional Status: BMI 25 -29 Overweight Nutritional Risks: None Diabetes:  No  How often do you need to have someone help you when you read instructions, pamphlets, or other written materials from your doctor or pharmacy?: 1 - Never What is the last grade level you completed in school?: 10th grade  Diabetic? no  Interpreter Needed?: No  Information  entered by :: NAllen LPN   Activities of Daily Living    05/11/2022    8:36 AM  In your present state of health, do you have any difficulty performing the following activities:  Hearing? 1  Comment has hearing aids  Vision? 0  Difficulty concentrating or making decisions? 0  Walking or climbing stairs? 0  Dressing or bathing? 0  Doing errands, shopping? 0  Preparing Food and eating ? N  Using the Toilet? N  In the past six months, have you accidently leaked urine? Y  Comment sometimes with cough  Do you have problems with loss of bowel control? N  Managing your Medications? N  Managing your Finances? N  Housekeeping or managing your Housekeeping? N    Patient Care Team: Tonia Ghent, MD as PCP - General (Family Medicine) Rockey Situ Kathlene November, MD as PCP - Cardiology (Cardiology) Minna Merritts, MD (Cardiology) Earnie Larsson, MD as Consulting Physician (Neurosurgery) Charlton Haws, [facility] as Pharmacist (Pharmacist)  Indicate any recent Medical Services you may have received from other than [facility] providers in the past year (date may be approximate).     Assessment:   This is a routine wellness examination for Catherine Green.  Hearing/Vision screen Vision Screening - Comments:: Regular eye exams, Dr[facility] Ellie Lunch, [facility]  Dietary issues and exercise activities discussed: Current Exercise Habits: The patient does not participate in regular exercise at present   Goals Addressed             This Visit's Progress    Patient Stated       05/11/2022, want to get over weakness       Depression Screen    05/11/2022    8:36 AM 05/10/2021   10:48 AM 05/10/2021   10:14 AM 05/10/2021   10:07 AM 02/07/2021    9:06 AM 01/31/2019    8:12 AM 01/11/2018    9:16 AM  PHQ 2/9 Scores  PHQ - 2 Score 0 0 0 0 6 3 0  PHQ- 9 Score  0 0 0 12 8 0    Fall Risk    05/11/2022    8:35 AM 02/07/2021    9:05 AM 09/13/2020   12:36 PM 01/31/2019    8:12 AM 01/11/2018    9:16 AM  Fall Risk    Falls in the past year? 0 0 1 0 No  Number falls in past yr: 0 0 1    Injury with Fall? 0  1    Risk for fall due to : Impaired balance/gait;Medication side effect  History of fall(s)    Follow up Falls prevention discussed;Falls evaluation completed;Education provided  Falls evaluation completed      FALL RISK PREVENTION PERTAINING TO THE HOME:  Any stairs in or around the home? Yes  If so, are there any without handrails? No  Home free of loose throw rugs in walkways, pet beds, electrical cords, etc? Yes  Adequate lighting in your home to reduce risk of falls? Yes   ASSISTIVE DEVICES UTILIZED TO PREVENT FALLS:  Life alert? No  Use of a cane, walker or w/c? Yes  Grab bars in the bathroom? Yes  Shower chair or bench  in shower? Yes  Elevated toilet seat or a handicapped toilet? Yes   TIMED UP AND GO:  Was the test performed? No .      Cognitive Function:    01/31/2019    8:31 AM 01/11/2018    9:16 AM 01/07/2017   11:01 AM  MMSE - Mini Mental State Exam  Orientation to time '5 5 5  '$ Orientation to Place '5 5 5  '$ Registration '3 3 3  '$ Attention/ Calculation 0 0 0  Recall '3 3 3  '$ Language- name 2 objects 0 0 0  Language- repeat '1 1 1  '$ Language- follow 3 step command 0 3 3  Language- read & follow direction 0 0 0  Write a sentence 0 0 0  Copy design 0 0 0  Total score '17 20 20        '$ 05/11/2022    8:37 AM  6CIT Screen  What Year? 0 points  What month? 0 points  What time? 0 points  Count back from 20 0 points  Months in reverse 0 points  Repeat phrase 0 points  Total Score 0 points    Immunizations Immunization History  Administered Date(s) Administered   Fluad Quad(high Dose 65+) 05/11/2019, 07/08/2020   Influenza Split 05/27/2012   Influenza Whole 06/20/2013   Influenza, High Dose Seasonal PF 05/27/2018   Influenza, Seasonal, Injecte, Preservative Fre 06/24/2016   Influenza,inj,Quad PF,6+ Mos 06/14/2014, 06/07/2015, 05/24/2017   PFIZER(Purple Top)SARS-COV-2  Vaccination 09/05/2019, 09/26/2019, 06/12/2020   Pfizer Covid-19 Vaccine Bivalent Booster 80yr & up 10/16/2021   Pneumococcal Conjugate-13 01/07/2017   Pneumococcal Polysaccharide-23 03/11/2011   Tdap 05/27/2012   Zoster, Live 03/11/2011    TDAP status: Up to date  Flu Vaccine status: Due, Education has been provided regarding the importance of this vaccine. Advised may receive this vaccine at local pharmacy or Health Dept. Aware to provide a copy of the vaccination record if obtained from local pharmacy or Health Dept. Verbalized acceptance and understanding.  Pneumococcal vaccine status: Up to date  Covid-19 vaccine status: Completed vaccines  Qualifies for Shingles Vaccine? Yes   Zostavax completed Yes   Shingrix Completed?: No.    Education has been provided regarding the importance of this vaccine. Patient has been advised to call insurance company to determine out of pocket expense if they have not yet received this vaccine. Advised may also receive vaccine at local pharmacy or Health Dept. Verbalized acceptance and understanding.  Screening Tests Health Maintenance  Topic Date Due   Zoster Vaccines- Shingrix (1 of 2) Never done   COVID-19 Vaccine (5 - Pfizer series) 02/13/2022   INFLUENZA VACCINE  03/31/2022   MAMMOGRAM  01/07/2026 (Originally 03/10/2012)   DEXA SCAN  01/07/2026 (Originally 03/12/2005)   TETANUS/TDAP  05/27/2022   Pneumonia Vaccine 81 Years old  Completed   HPV VACCINES  Aged Out    Health Maintenance  Health Maintenance Due  Topic Date Due   Zoster Vaccines- Shingrix (1 of 2) Never done   COVID-19 Vaccine (5 - Pfizer series) 02/13/2022   INFLUENZA VACCINE  03/31/2022    Colorectal cancer screening: No longer required.   Mammogram status: No longer required due to age.  Bone Density status: declines  Lung Cancer Screening: (Low Dose CT Chest recommended if Age 82-80years, 30 pack-year currently smoking OR have quit w/in 15years.) does not  qualify.   Lung Cancer Screening Referral: no  Additional Screening:  Hepatitis C Screening: does not qualify;  Vision Screening: Recommended annual ophthalmology  exams for early detection of glaucoma and other disorders of the eye. Is the patient up to date with their annual eye exam?  Yes  Who is the provider or what is the name of the office in which the patient attends annual eye exams? Dr. Ellie Lunch If pt is not established with a provider, would they like to be referred to a provider to establish care? No .   Dental Screening: Recommended annual dental exams for proper oral hygiene  Community Resource Referral / Chronic Care Management: CRR required this visit?  No   CCM required this visit?  No      Plan:     I have personally reviewed and noted the following in the patient's chart:   Medical and social history Use of alcohol, tobacco or illicit drugs  Current medications and supplements including opioid prescriptions. Patient is not currently taking opioid prescriptions. Functional ability and status Nutritional status Physical activity Advanced directives List of other physicians Hospitalizations, surgeries, and ER visits in previous 12 months Vitals Screenings to include cognitive, depression, and falls Referrals and appointments  In addition, I have reviewed and discussed with patient certain preventive protocols, quality metrics, and best practice recommendations. A written personalized care plan for preventive services as well as general preventive health recommendations were provided to patient.     Kellie Simmering, LPN   [id number]/[id number]   Nurse Notes: complaints of pain in left pinky finger. Appointment made for 9/14 with Dr. Damita Dunnings.  Due to this being a virtual visit, the after visit summary with patients personalized plan was offered to patient via mail or my-chart. Patient would like to access on my-chart

## 2022-05-11 NOTE — Patient Instructions (Signed)
Catherine Green , Thank you for taking time to come for your Medicare Wellness Visit. I appreciate your ongoing commitment to your health goals. Please review the following plan we discussed and let me know if I can assist you in the future.   Screening recommendations/referrals: Colonoscopy: not required Mammogram: not required Bone Density: decline Recommended yearly ophthalmology/optometry visit for glaucoma screening and checkup Recommended yearly dental visit for hygiene and checkup  Vaccinations: Influenza vaccine: due Pneumococcal vaccine: completed 01/07/2017 Tdap vaccine: completed 05/27/2012, due 05/27/2022 Shingles vaccine: discussed   Covid-19: 10/16/2021, 06/12/2020, 09/26/2019, 09/05/2019  Advanced directives: Please bring a copy of your POA (Power of Attorney) and/or Living Will to your next appointment.   Conditions/risks identified: painful left pinky finger  Next appointment: Follow up in one year for your annual wellness visit    Preventive Care 65 Years and Older, Female Preventive care refers to lifestyle choices and visits with your health care provider that can promote health and wellness. What does preventive care include? A yearly physical exam. This is also called an annual well check. Dental exams once or twice a year. Routine eye exams. Ask your health care provider how often you should have your eyes checked. Personal lifestyle choices, including: Daily care of your teeth and gums. Regular physical activity. Eating a healthy diet. Avoiding tobacco and drug use. Limiting alcohol use. Practicing safe sex. Taking low-dose aspirin every day. Taking vitamin and mineral supplements as recommended by your health care provider. What happens during an annual well check? The services and screenings done by your health care provider during your annual well check will depend on your age, overall health, lifestyle risk factors, and family history of disease. Counseling   Your health care provider may ask you questions about your: Alcohol use. Tobacco use. Drug use. Emotional well-being. Home and relationship well-being. Sexual activity. Eating habits. History of falls. Memory and ability to understand (cognition). Work and work Statistician. Reproductive health. Screening  You may have the following tests or measurements: Height, weight, and BMI. Blood pressure. Lipid and cholesterol levels. These may be checked every 5 years, or more frequently if you are over 97 years old. Skin check. Lung cancer screening. You may have this screening every year starting at age 100 if you have a 30-pack-year history of smoking and currently smoke or have quit within the past 15 years. Fecal occult blood test (FOBT) of the stool. You may have this test every year starting at age 75. Flexible sigmoidoscopy or colonoscopy. You may have a sigmoidoscopy every 5 years or a colonoscopy every 10 years starting at age 53. Hepatitis C blood test. Hepatitis B blood test. Sexually transmitted disease (STD) testing. Diabetes screening. This is done by checking your blood sugar (glucose) after you have not eaten for a while (fasting). You may have this done every 1-3 years. Bone density scan. This is done to screen for osteoporosis. You may have this done starting at age 45. Mammogram. This may be done every 1-2 years. Talk to your health care provider about how often you should have regular mammograms. Talk with your health care provider about your test results, treatment options, and if necessary, the need for more tests. Vaccines  Your health care provider may recommend certain vaccines, such as: Influenza vaccine. This is recommended every year. Tetanus, diphtheria, and acellular pertussis (Tdap, Td) vaccine. You may need a Td booster every 10 years. Zoster vaccine. You may need this after age 66. Pneumococcal 13-valent conjugate (PCV13) vaccine.  One dose is recommended  after age 85. Pneumococcal polysaccharide (PPSV23) vaccine. One dose is recommended after age 85. Talk to your health care provider about which screenings and vaccines you need and how often you need them. This information is not intended to replace advice given to you by your health care provider. Make sure you discuss any questions you have with your health care provider. Document Released: 09/13/2015 Document Revised: 05/06/2016 Document Reviewed: 06/18/2015 Elsevier Interactive Patient Education  2017 [place] Prevention in the Home Falls can cause injuries. They can happen to people of all ages. There are many things you can do to make your home safe and to help prevent falls. What can I do on the outside of my home? Regularly fix the edges of walkways and driveways and fix any cracks. Remove anything that might make you trip as you walk through a door, such as a raised step or threshold. Trim any bushes or trees on the path to your home. Use bright outdoor lighting. Clear any walking paths of anything that might make someone trip, such as rocks or tools. Regularly check to see if handrails are loose or broken. Make sure that both sides of any steps have handrails. Any raised decks and porches should have guardrails on the edges. Have any leaves, snow, or ice cleared regularly. Use sand or salt on walking paths during winter. Clean up any spills in your garage right away. This includes oil or grease spills. What can I do in the bathroom? Use night lights. Install grab bars by the toilet and in the tub and shower. Do not use towel bars as grab bars. Use non-skid mats or decals in the tub or shower. If you need to sit down in the shower, use a plastic, non-slip stool. Keep the floor dry. Clean up any water that spills on the floor as soon as it happens. Remove soap buildup in the tub or shower regularly. Attach bath mats securely with double-sided non-slip rug tape. Do not  have throw rugs and other things on the floor that can make you trip. What can I do in the bedroom? Use night lights. Make sure that you have a light by your bed that is easy to reach. Do not use any sheets or blankets that are too big for your bed. They should not hang down onto the floor. Have a firm chair that has side arms. You can use this for support while you get dressed. Do not have throw rugs and other things on the floor that can make you trip. What can I do in the kitchen? Clean up any spills right away. Avoid walking on wet floors. Keep items that you use a lot in easy-to-reach places. If you need to reach something above you, use a strong step stool that has a grab bar. Keep electrical cords out of the way. Do not use floor polish or wax that makes floors slippery. If you must use wax, use non-skid floor wax. Do not have throw rugs and other things on the floor that can make you trip. What can I do with my stairs? Do not leave any items on the stairs. Make sure that there are handrails on both sides of the stairs and use them. Fix handrails that are broken or loose. Make sure that handrails are as long as the stairways. Check any carpeting to make sure that it is firmly attached to the stairs. Fix any carpet that is loose or  worn. Avoid having throw rugs at the top or bottom of the stairs. If you do have throw rugs, attach them to the floor with carpet tape. Make sure that you have a light switch at the top of the stairs and the bottom of the stairs. If you do not have them, ask someone to add them for you. What else can I do to help prevent falls? Wear shoes that: Do not have high heels. Have rubber bottoms. Are comfortable and fit you well. Are closed at the toe. Do not wear sandals. If you use a stepladder: Make sure that it is fully opened. Do not climb a closed stepladder. Make sure that both sides of the stepladder are locked into place. Ask someone to hold it for  you, if possible. Clearly mark and make sure that you can see: Any grab bars or handrails. First and last steps. Where the edge of each step is. Use tools that help you move around (mobility aids) if they are needed. These include: Canes. Walkers. Scooters. Crutches. Turn on the lights when you go into a dark area. Replace any light bulbs as soon as they burn out. Set up your furniture so you have a clear path. Avoid moving your furniture around. If any of your floors are uneven, fix them. If there are any pets around you, be aware of where they are. Review your medicines with your doctor. Some medicines can make you feel dizzy. This can increase your chance of falling. Ask your doctor what other things that you can do to help prevent falls. This information is not intended to replace advice given to you by your health care provider. Make sure you discuss any questions you have with your health care provider. Document Released: 06/13/2009 Document Revised: 01/23/2016 Document Reviewed: 09/21/2014 Elsevier Interactive Patient Education  2017 [employer].

## 2022-05-12 ENCOUNTER — Other Ambulatory Visit: Payer: Self-pay | Admitting: Gastroenterology

## 2022-05-12 ENCOUNTER — Ambulatory Visit
Admission: RE | Admit: 2022-05-12 | Discharge: 2022-05-12 | Disposition: A | Discharge status: Home / Self Care | Payer: Medicare HMO | Source: Ambulatory Visit | Attending: Gastroenterology | Admitting: Gastroenterology

## 2022-05-12 DIAGNOSIS — K219 Gastro-esophageal reflux disease without esophagitis: Secondary | ICD-10-CM | POA: Diagnosis not present

## 2022-05-12 DIAGNOSIS — R1310 Dysphagia, unspecified: Secondary | ICD-10-CM | POA: Insufficient documentation

## 2022-05-14 ENCOUNTER — Ambulatory Visit (INDEPENDENT_AMBULATORY_CARE_PROVIDER_SITE_OTHER): Payer: Medicare HMO | Admitting: Family Medicine

## 2022-05-14 ENCOUNTER — Encounter: Payer: Self-pay | Admitting: Family Medicine

## 2022-05-14 VITALS — BP 110/80 | HR 53 | Temp 97.7°F | Ht 62.0 in | Wt 142.0 lb

## 2022-05-14 DIAGNOSIS — M67449 Ganglion, unspecified hand: Secondary | ICD-10-CM

## 2022-05-14 MED ORDER — LORATADINE 10 MG PO TABS: 10.0000 mg | ORAL_TABLET | Freq: Every day | ORAL | Status: DC

## 2022-05-14 MED ORDER — CETIRIZINE HCL 10 MG PO TABS: 10.0000 mg | ORAL_TABLET | Freq: Every day | ORAL | Status: DC

## 2022-05-14 NOTE — Progress Notes (Unsigned)
Lesion on L 5th finger.  Tender/painful. Cystic lesion at the L 5th DIP.  Has drained some clear fluid with DIP flexion.    Meds, vitals, and allergies reviewed.   ROS: Per HPI unless specifically indicated in ROS section   Nad She has a mucoid cyst on the left fifth DIP without spreading erythema or purulent discharge.

## 2022-05-14 NOTE — Patient Instructions (Signed)
No charge for the visit.  We'll call about seeing ortho.  If you don't get a call by early next week then let me know.  Take care.  Glad to see you.

## 2022-05-15 ENCOUNTER — Other Ambulatory Visit: Payer: Self-pay | Admitting: Physician Assistant

## 2022-05-17 DIAGNOSIS — M67449 Ganglion, unspecified hand: Secondary | ICD-10-CM | POA: Insufficient documentation

## 2022-05-17 NOTE — Assessment & Plan Note (Signed)
No charge for visit.  Anatomy discussed with patient.  Refer to orthopedics.  See after visit summary.  She agrees with plan.

## 2022-05-19 DIAGNOSIS — Z961 Presence of intraocular lens: Secondary | ICD-10-CM | POA: Diagnosis not present

## 2022-05-19 DIAGNOSIS — H52202 Unspecified astigmatism, left eye: Secondary | ICD-10-CM | POA: Diagnosis not present

## 2022-05-19 DIAGNOSIS — D23122 Other benign neoplasm of skin of left lower eyelid, including canthus: Secondary | ICD-10-CM | POA: Diagnosis not present

## 2022-05-19 DIAGNOSIS — H353111 Nonexudative age-related macular degeneration, right eye, early dry stage: Secondary | ICD-10-CM | POA: Diagnosis not present

## 2022-05-19 DIAGNOSIS — D23112 Other benign neoplasm of skin of right lower eyelid, including canthus: Secondary | ICD-10-CM | POA: Diagnosis not present

## 2022-06-09 ENCOUNTER — Other Ambulatory Visit: Payer: Self-pay | Admitting: Physician Assistant

## 2022-06-09 DIAGNOSIS — M67442 Ganglion, left hand: Secondary | ICD-10-CM | POA: Diagnosis not present

## 2022-06-11 ENCOUNTER — Ambulatory Visit: Payer: Medicare HMO | Admitting: Speech Pathology

## 2022-06-15 ENCOUNTER — Telehealth: Payer: Self-pay

## 2022-06-15 ENCOUNTER — Ambulatory Visit: Payer: Medicare HMO | Admitting: Speech Pathology

## 2022-06-15 NOTE — Progress Notes (Signed)
Chronic Care Management Pharmacy Assistant   Name: Catherine Green  MRN: [id number] DOB: 16-Mar-1941  Reason for Encounter: CCM (Hypertension Disease State)  Recent office visits:  05/14/22 Elsie Stain, MD Mucous cyst of finger Start: Cetrizine HCI 10 mg Stop: methylPREDNISolone (MEDROL DOSEPAK) 4 MG TBPK tablet 05/11/22 AWV  02/26/22 Elsie Stain, MD Cough Start: azithromycin (ZITHROMAX) 250 MG tablet  Recent consult visits:  05/12/22 X-ray barium swallow Results: Moderate volume gastroesophageal reflux to distal esophagus. Otherwise normal fluoroscopic esophagram study 04/29/22 Andrey Farmer, MD Dysphagia Ordered: X-ray barium swallow  04/21/22 Christell Faith, PA (Cardiology) CHF Ordered: EKG No med changes FU 6 months 04/08/22 Owens Loffler, MD Localized osteoarthritis of left hand Start: methylPREDNISolone (MEDROL DOSEPAK) 4 MG TBPK tablet Stop: azithromycin (ZITHROMAX) 250 MG tablet  Hospital visits:  None in previous 6 months  Medications: Outpatient Encounter Medications as of 06/15/2022  Medication Sig   albuterol (VENTOLIN HFA) 108 (90 Base) MCG/ACT inhaler Inhale 2 puffs into the lungs every 6 (six) hours as needed for wheezing or shortness of breath.   amLODipine (NORVASC) 2.5 MG tablet Take 0.5 tablets (1.25 mg total) by mouth daily.   aspirin 81 MG tablet Take 1 tablet (81 mg total) by mouth daily.   cetirizine (ZYRTEC) 10 MG tablet Take 1 tablet (10 mg total) by mouth daily.   Coenzyme Q10 (CO Q10) 100 MG CAPS Take 100 mg by mouth daily.   ezetimibe (ZETIA) 10 MG tablet TAKE 1 TABLET BY MOUTH DAILY   furosemide (LASIX) 20 MG tablet TAKE 1 TABLET BY MOUTH DAILY.   gabapentin (NEURONTIN) 400 MG capsule TAKE 1 CAPSULE BY MOUTH EVERY 8 HOURS   ketoconazole (NIZORAL) 2 % shampoo Apply topically as directed.   metoprolol succinate (TOPROL XL) 25 MG 24 hr tablet Take 0.5 tablets (12.5 mg total) by mouth at bedtime.   omeprazole (PRILOSEC) 20 MG capsule Take 1 capsule (20  mg total) by mouth 2 (two) times daily before a meal.   potassium chloride (KLOR-CON) 10 MEQ tablet TAKE 1 TABLET BY MOUTH DAILY   rosuvastatin (CRESTOR) 20 MG tablet TAKE ONE TABLET BY MOUTH DAILY   sertraline (ZOLOFT) 100 MG tablet TAKE ONE AND A HALF TABLETS BY MOUTH EVERY DAY   vitamin B-12 (CYANOCOBALAMIN) 1000 MCG tablet Take 1 tablet (1,000 mcg total) by mouth daily.   No facility-administered encounter medications on file as of 06/15/2022.    Recent Office Vitals: BP Readings from Last 3 Encounters:  05/14/22 110/80  04/21/22 (!) 158/64  04/08/22 (!) 160/60   Pulse Readings from Last 3 Encounters:  05/14/22 (!) 53  04/21/22 61  04/08/22 60    Wt Readings from Last 3 Encounters:  05/14/22 142 lb (64.4 kg)  05/11/22 140 lb (63.5 kg)  04/21/22 142 lb 9.6 oz (64.7 kg)     Kidney Function Lab Results  Component Value Date/Time   CREATININE 0.76 08/08/2021 03:42 PM   CREATININE 0.79 04/14/2021 11:29 AM   CREATININE 0.81 08/04/2017 04:49 PM   GFR 70.32 04/14/2021 11:29 AM   GFRNONAA >60 08/08/2021 03:42 PM   GFRAA 73 07/29/2020 11:13 AM      Latest Ref Rng & Units 08/08/2021    3:42 PM 04/14/2021   11:29 AM 01/21/2021   10:32 AM  BMP  Glucose 70 - 99 mg/dL 107  98  94   BUN 8 - 23 mg/dL '18  17  13   '$ Creatinine 0.44 - 1.00 mg/dL 0.76  0.79  0.85  BUN/Creat Ratio 12 - 28   15   Sodium 135 - 145 mmol/L 138  141  140   Potassium 3.5 - 5.1 mmol/L 4.3  3.5  4.1   Chloride 98 - 111 mmol/L 103  101  97   CO2 22 - 32 mmol/L '29  31  27   '$ Calcium 8.9 - 10.3 mg/dL 9.3  9.0  9.5    Contacted patient on 06/22/2022 to discuss hypertension disease state  Current antihypertensive regimen:  Furosemide 20 mg daily  Potassium chloride 10 mEq daily Amlodipine 2.5 mg -1/2 tab AM Metoprolol succinate 12.5 mg daily PM   Patient verbally confirms she is taking the above medications as directed. Yes  How often are you checking your Blood Pressure? infrequently I have asked patient  to take their blood pressure daily and keep a log. Advised patient I would call back on 06/26/2022 for log. Patient verbalized understanding and agreed.   Wrist or arm cuff: Arm cuff Caffeine intake: Patient drinks 1 cup of coffee in the morning Salt intake: Patient limits salt Over the counter medications including pseudoephedrine or NSAIDs? 1 low dose aspirin daily  What recent interventions/DTPs have been made by any provider to improve Blood Pressure control since last CPP Visit: Continue current medication  Any recent hospitalizations or ED visits since last visit with CPP? No  Adherence Review: Is the patient currently on ACE/ARB medication? No Does the patient have >5 day gap between last estimated fill dates? No  Summary of recommendations from last [place] visit (Date:02/18/22)  Summary: CCM F/U visit -Reviewed medications; pt affirms compliance as prescribed -BP is at goal at home on average (SBP 119-130 over past several weeks) -Pt reports she increased sertraline to 100 mg since 50 mg was not working as well  -Pt reports improvement in dysphagia sent increasing omeprazole; she also has appt scheduled with GI in August   Recommendations/Changes made from today's visit: -No med changes   Plan: -[place] will call patient in 3 months for BP check -Pharmacist follow up televisit scheduled for 6 months  Star Rating Drugs:  Medication:  Last Fill: Day Supply Rosuvastatin 20 mg 05/07/22 100  Care Gaps: Annual wellness visit in last year? Yes 05/11/22 Most Recent BP reading: 110/80 on 05/14/22  Upcoming appointments: CCM appointment on 09/02/2022  Charlene Brooke, CPP notified  Marijean Niemann, [place] Assistant [phone]    .

## 2022-06-19 ENCOUNTER — Ambulatory Visit: Payer: Medicare HMO | Admitting: Speech Pathology

## 2022-06-25 ENCOUNTER — Ambulatory Visit: Payer: Medicare HMO | Admitting: Speech Pathology

## 2022-06-30 ENCOUNTER — Ambulatory Visit: Payer: Medicare HMO | Admitting: Speech Pathology

## 2022-07-03 ENCOUNTER — Ambulatory Visit: Payer: Medicare HMO | Admitting: Speech Pathology

## 2022-07-07 ENCOUNTER — Ambulatory Visit: Payer: Medicare HMO | Admitting: Speech Pathology

## 2022-07-07 DIAGNOSIS — M67442 Ganglion, left hand: Secondary | ICD-10-CM | POA: Diagnosis not present

## 2022-07-09 ENCOUNTER — Ambulatory Visit: Payer: Medicare HMO | Admitting: Speech Pathology

## 2022-07-16 ENCOUNTER — Ambulatory Visit: Payer: Medicare HMO | Admitting: Speech Pathology

## 2022-07-16 ENCOUNTER — Telehealth: Payer: Self-pay | Admitting: Family Medicine

## 2022-07-16 NOTE — Telephone Encounter (Signed)
Patient called and stated she is having lightheadedness, dizziness, being off balance and being weak. Patient was sent to access nurse

## 2022-07-16 NOTE — Telephone Encounter (Signed)
Okay--I will check her tomorrow

## 2022-07-16 NOTE — Telephone Encounter (Signed)
[facility] - Client TELEPHONE ADVICE RECORD [facility] Patient Name: Catherine Green ER Gender: Female DOB: [birth year] Age: 81 Y 60 M 3 D Return Phone Number: [phone] (Primary) Address: City/ State/ Zip: [place]  [id number] Client [facility] - Client Client Site [facility] - Day Provider Renford Dills - MD Contact Type Call Who Is Calling Patient / Member / Family / Caregiver Call Type Triage / Clinical Relationship To Patient Self Return Phone Number [phone] (Primary) Chief Complaint Dizziness Reason for Call Symptomatic / Request for [place] states her s/s dizziness, light headiness, weak, and off balanced. Translation No Nurse Assessment Nurse: Patsey Berthold, RN, [place] Date/Time (Eastern Time): 07/16/2022 3:55:09 PM Confirm and document reason for call. If symptomatic, describe symptoms. ---Caller states has dizziness, light headiness, weak, and off balanced, onset 2-3 years. Reports worsening dizziness. Does the patient have any new or worsening symptoms? ---Yes Will a triage be completed? ---Yes Related visit to physician within the last 2 weeks? ---No Does the PT have any chronic conditions? (i.e. diabetes, asthma, this includes High risk factors for pregnancy, etc.) ---Yes List chronic conditions. ---HTN "Heart" Is this a behavioral health or substance abuse call? ---No Guidelines Guideline Title Affirmed Question Affirmed Notes Nurse Date/Time (Eastern Time) Dizziness - Vertigo SEVERE dizziness (vertigo) (e.g., unable to walk without assistance) Patsey Berthold, RN, [place] 07/16/2022 3:59:55 PM Disp. Time Eilene Ghazi Time) Disposition Final User 07/16/2022 4:03:40 PM Go to ED Now (or PCP triage) Yes Patsey Berthold, [place], [place] Final Disposition 07/16/2022 4:03:40 PM Go to ED Now (or PCP triage) Yes Patsey Berthold, RN, [place] PLEASE NOTE: All timestamps contained  within this report are represented as [place] Standard Time. CONFIDENTIALTY NOTICE: This fax transmission is intended only for the addressee. It contains information that is legally privileged, confidential or otherwise protected from use or disclosure. If you are not the intended recipient, you are strictly prohibited from reviewing, disclosing, copying using or disseminating any of this information or taking any action in reliance on or regarding this information. If you have received this fax in error, please notify us immediately by telephone so that we can arrange for its return to [place]. Phone: [phone], Toll-Free: [phone], Fax: [phone] Page: 2 of 2 Call Id: [id number] Nara Visa Disagree/Comply Disagree Caller Understands Yes PreDisposition Did not know what to do Care Advice Given Per Guideline GO TO ED NOW (OR PCP TRIAGE): * IF NO PCP (PRIMARY CARE PROVIDER) SECOND-LEVEL TRIAGE: You need to be seen within the next hour. Go to the [facility] at _____________ [place] as soon as you can. CARE ADVICE given per Dizziness - Vertigo (Adult) guideline. NOTE TO TRIAGER - DRIVING: * Another adult should drive. Comments User: Lerry Liner, RN Date/Time Eilene Ghazi Time): 07/16/2022 3:58:57 PM reports short of breath with activity, states not a new or worsening symptom User: Lerry Liner, RN Date/Time Eilene Ghazi Time): 07/16/2022 4:05:10 PM calling [phone] backline since patient refusing ED outcome, speaking [place] at this time. Referrals GO TO FACILITY REFUSE

## 2022-07-16 NOTE — Telephone Encounter (Signed)
Pt said she went out to [facility] and all of a sudden legs felt weak and wobbly and lightheaded; lasted about 10 - 15 mins.No CP,SOB or H/A. Now pt is sitting resting at home with no symptoms. Pt said this has been going on for years but seems to be more often and worsening. Pt does not want to go to UC or ED and pt scheduled appt with Dr Silvio Pate on 07/17/22 at 11 AM. Pt could not schedule sooner appt due to workers coming to house. UC & ED precautions given and pt voiced understanding.sending note to Dr Silvio Pate and Silvio Pate pool.

## 2022-07-16 NOTE — Telephone Encounter (Signed)
Received call from louis at access nurse. He advised patient that she needs to go to ED for evaluation. Patient declined request call from office. Sending note to triage and sending teams to let Rena know.

## 2022-07-17 ENCOUNTER — Encounter: Payer: Self-pay | Admitting: Neurology

## 2022-07-17 ENCOUNTER — Ambulatory Visit (INDEPENDENT_AMBULATORY_CARE_PROVIDER_SITE_OTHER): Payer: Medicare HMO | Admitting: Internal Medicine

## 2022-07-17 ENCOUNTER — Encounter: Payer: Self-pay | Admitting: Internal Medicine

## 2022-07-17 VITALS — BP 150/74 | HR 63 | Temp 97.7°F | Ht 62.0 in | Wt 138.0 lb

## 2022-07-17 DIAGNOSIS — M6281 Muscle weakness (generalized): Secondary | ICD-10-CM | POA: Diagnosis not present

## 2022-07-17 LAB — COMPREHENSIVE METABOLIC PANEL
ALT: 9 U/L (ref 0–35)
AST: 17 U/L (ref 0–37)
Albumin: 4.5 g/dL (ref 3.5–5.2)
Alkaline Phosphatase: 102 U/L (ref 39–117)
BUN: 15 mg/dL (ref 6–23)
CO2: 32 mEq/L (ref 19–32)
Calcium: 9.3 mg/dL (ref 8.4–10.5)
Chloride: 99 mEq/L (ref 96–112)
Creatinine, Ser: 0.93 mg/dL (ref 0.40–1.20)
GFR: 57.3 mL/min — ABNORMAL LOW (ref >60.00)
Glucose, Bld: 92 mg/dL (ref 70–99)
Potassium: 4.8 mEq/L (ref 3.5–5.1)
Sodium: 139 mEq/L (ref 135–145)
Total Bilirubin: 0.5 mg/dL (ref 0.2–1.2)
Total Protein: 6.7 g/dL (ref 6.0–8.3)

## 2022-07-17 LAB — LIPID PANEL
Cholesterol: 148 mg/dL (ref 0–200)
HDL: 60 mg/dL (ref >39.00)
LDL Cholesterol: 61 mg/dL (ref 0–99)
NonHDL: 88.21
Total CHOL/HDL Ratio: 2
Triglycerides: 136 mg/dL (ref 0.0–149.0)
VLDL: 27.2 mg/dL (ref 0.0–40.0)

## 2022-07-17 LAB — CBC
HCT: 37.3 % (ref 36.0–46.0)
Hemoglobin: 12.2 g/dL (ref 12.0–15.0)
MCHC: 32.7 g/dL (ref 30.0–36.0)
MCV: 80.3 fl (ref 78.0–100.0)
Platelets: 359 10*3/uL (ref 150.0–400.0)
RBC: 4.64 Mil/uL (ref 3.87–5.11)
RDW: 15.1 % (ref 11.5–15.5)
WBC: 5.9 10*3/uL (ref 4.0–10.5)

## 2022-07-17 LAB — CK: Total CK: 134 U/L (ref 7–177)

## 2022-07-17 LAB — SEDIMENTATION RATE: Sed Rate: 25 mm/hr (ref 0–30)

## 2022-07-17 LAB — TSH: TSH: 1.02 u[IU]/mL (ref 0.35–5.50)

## 2022-07-17 LAB — VITAMIN B12: Vitamin B-12: 874 pg/mL (ref 211–911)

## 2022-07-17 NOTE — Assessment & Plan Note (Signed)
Fairly severe and now with gait problems and falls Seems likely neurogenic--but also arms (so unlikely just related to her past spine surgery) Will check labs Have her stop the crestor for a month to see if that makes any difference Neurology evaluation

## 2022-07-17 NOTE — Progress Notes (Signed)
Subjective:    Patient ID: Catherine Green, female    DOB: 08-Nov-1940, 81 y.o.   MRN: [id number]  HPI Here due to weakness and episode of passing out  Was checking into dentist's office-- 11/7 Doesn't remember exactly what happened --did wind up on floor Doesn't think she lost conciousness--"legs just went out from under me"  Feels weak--"all the time"---many months Legs from thighs to calves--"just go out from under me" Only has pain when putting on or off the compression hose Arms aren't really weak  Does walk some--like shopping This brings on the weakness  Had back surgery several years ago Then had multiple injections into back--trouble sitting after the last one (1 year ago or more) Feels it got weak since then Gets "nerve like twitching" down leg--then shakes, then goes out  Current Outpatient Medications on File Prior to Visit  Medication Sig Dispense Refill   albuterol (VENTOLIN HFA) 108 (90 Base) MCG/ACT inhaler Inhale 2 puffs into the lungs every 6 (six) hours as needed for wheezing or shortness of breath. 8 g 0   amLODipine (NORVASC) 2.5 MG tablet Take 0.5 tablets (1.25 mg total) by mouth daily.     aspirin 81 MG tablet Take 1 tablet (81 mg total) by mouth daily. 30 tablet    cetirizine (ZYRTEC) 10 MG tablet Take 1 tablet (10 mg total) by mouth daily.     Coenzyme Q10 (CO Q10) 100 MG CAPS Take 100 mg by mouth daily.     ezetimibe (ZETIA) 10 MG tablet TAKE 1 TABLET BY MOUTH DAILY 90 tablet 1   furosemide (LASIX) 20 MG tablet TAKE 1 TABLET BY MOUTH DAILY. 90 tablet 3   gabapentin (NEURONTIN) 400 MG capsule TAKE 1 CAPSULE BY MOUTH EVERY 8 HOURS 90 capsule 5   ketoconazole (NIZORAL) 2 % shampoo Apply topically as directed.     metoprolol succinate (TOPROL XL) 25 MG 24 hr tablet Take 0.5 tablets (12.5 mg total) by mouth at bedtime. 45 tablet 3   omeprazole (PRILOSEC) 20 MG capsule Take 1 capsule (20 mg total) by mouth 2 (two) times daily before a meal.     potassium  chloride (KLOR-CON) 10 MEQ tablet TAKE 1 TABLET BY MOUTH DAILY 90 tablet 1   rosuvastatin (CRESTOR) 20 MG tablet TAKE ONE TABLET BY MOUTH DAILY 90 tablet 1   sertraline (ZOLOFT) 100 MG tablet TAKE ONE AND A HALF TABLETS BY MOUTH EVERY DAY 150 tablet 2   vitamin B-12 (CYANOCOBALAMIN) 1000 MCG tablet Take 1 tablet (1,000 mcg total) by mouth daily.     No current facility-administered medications on file prior to visit.    Allergies  Allergen Reactions   Lyrica [Pregabalin] Shortness Of Breath, Swelling and Other (See Comments)    chest tight   Hydrocodone Other (See Comments)    hallucinate   Ivp Dye [Iodinated Contrast Media] Hives and Itching        Lipitor [Atorvastatin] Nausea And Vomiting   Methylprednisolone Other (See Comments)    Severe weekness    Oxycodone Hcl Other (See Comments)    Hallucinations    Penicillin G Nausea And Vomiting   Shellfish Allergy Hives and Itching   Buspar [Buspirone]     Worsening mood   Clavulanic Acid    Prednisone    Augmentin [Amoxicillin-Pot Clavulanate] Nausea And Vomiting   Codeine Nausea And Vomiting and Other (See Comments)   Cymbalta [Duloxetine Hcl] Nausea Only    GI upset at '30mg'$    Demerol  Nausea And Vomiting   Doxycycline Nausea Only and Other (See Comments)    Weakness, sick to her stomach   Effexor [Venlafaxine] Nausea Only   Hydrochlorothiazide Other (See Comments)    Not an allergy but urinary frequency and sweating at '25mg'$    Hydroxyzine Other (See Comments)    Excessive sweating.    Metoprolol Other (See Comments)    Slowed body down, per pt    Protonix [Pantoprazole Sodium] Nausea Only   Trazodone And Nefazodone Other (See Comments)    Sedation     Past Medical History:  Diagnosis Date   Allergy    hay fever   Anemia    Anxiety    Back pain    Carotid artery occlusion    Cerebrovascular disease    extracranial; occlusive   Chicken pox    Coronary artery disease    Depression    Dizziness    Dizziness     DVT (deep venous thrombosis) (HCC)    Fainting spell    GERD (gastroesophageal reflux disease)    Headache    Heart murmur    Hyperlipidemia    Hypertension    Leg pain    PONV (postoperative nausea and vomiting)    severe nausea and vomiting   Pre-syncope    PVD (peripheral vascular disease) ([place])    endarterectomy by [name]   Seasonal allergies    Shortness of breath dyspnea    wth ambulation at times   Swelling of both ankles    and abdomen; takes Lasix when needed   Thrombophlebitis    following childbirth   Ulcer     Past Surgical History:  Procedure Laterality Date   APPENDECTOMY     BACK SURGERY  01/14/16   BREAST SURGERY     saline implants   CARDIAC CATHETERIZATION     CAROTID ENDARTERECTOMY  1992   CAROTID ENDARTERECTOMY Right February 13, 2005   Re-do Right CE   CATARACT EXTRACTION, BILATERAL  2013   CHOLECYSTECTOMY     CHOLECYSTECTOMY, LAPAROSCOPIC  04/02/14   Dr. Rochel Brome   CORONARY ARTERY BYPASS GRAFT  2006   x3 Dr. Prescott Gum   ENDARTERECTOMY Left 07/20/2014   Procedure: ENDARTERECTOMY CAROTID-LEFT;  Surgeon: Rosetta Posner, MD;  Location: [facility];  Service: Vascular;  Laterality: Left;   EYE SURGERY Bilateral Feb. 2013   Cataract Left eye   SEPTOPLASTY     with bilateral inferior turbinate reductions   SPINE SURGERY  12/2015   TOTAL ABDOMINAL HYSTERECTOMY     UPPER GASTROINTESTINAL ENDOSCOPY     had polyps removed from esophagus   UPPER GI ENDOSCOPY      Family History  Problem Relation Age of Onset   Aneurysm Mother        brain   Heart disease Mother        Aneyursm    Hyperlipidemia Mother    Hypertension Mother    Varicose Veins Mother    Bleeding Disorder Mother    Heart disease Father    Cirrhosis Father    Heart attack Father    Hyperlipidemia Father    Hypertension Father    Cancer Sister        lung   Heart disease Sister        Aneurysm   Hyperlipidemia Sister    Hypertension Sister    Varicose Veins Sister    Bleeding  Disorder Sister    Heart disease Brother  Before age 65   Aneurysm Brother    Deep vein thrombosis Brother    Birth defects Brother    Hyperlipidemia Brother    Hypertension Brother    Peripheral vascular disease Daughter    Hyperlipidemia Daughter    Hypertension Daughter    Hypertension Other    Breast cancer Neg Hx    Colon cancer Neg Hx     Social History   Socioeconomic History   Marital status: Married    Spouse name: Jeneen Rinks    Number of children: 1   Years of education: Not on file   Highest education level: 11th grade  Occupational History    Comment: Retired   Tobacco Use   Smoking status: Former    Packs/day: 0.30    Years: 15.00    Total pack years: 4.50    Types: Cigarettes    Quit date: 08/31/1996    Years since quitting: 25.8   Smokeless tobacco: Never  Vaping Use   Vaping Use: Never used  Substance and Sexual Activity   Alcohol use: No    Alcohol/week: 0.0 standard drinks of alcohol   Drug use: No   Sexual activity: Not Currently  Other Topics Concern   Not on file  Social History Narrative   Lives with husband   Work - retired Emergency planning/management officer   Diet - healthy   Right handed    Caffeine- 1 cup per day.   Social Determinants of Health   Financial Resource Strain: Low Risk  (05/11/2022)   Overall Financial Resource Strain (CARDIA)    Difficulty of Paying Living Expenses: Not hard at all  Food Insecurity: No Food Insecurity (05/11/2022)   Hunger Vital Sign    Worried About Running Out of Food in the Last Year: Never true    Ran Out of Food in the Last Year: Never true  Transportation Needs: No Transportation Needs (05/11/2022)   PRAPARE - Hydrologist (Medical): No    Lack of Transportation (Non-Medical): No  Physical Activity: Inactive (05/11/2022)   Exercise Vital Sign    Days of Exercise per Week: 0 days    Minutes of Exercise per Session: 0 min  Stress: No Stress Concern Present (05/11/2022)   [place]    Feeling of Stress : Not at all  Social Connections: Not on file  Intimate Partner Violence: Not on file   Review of Systems Had fever/illness in [place]. Improved     Objective:   Physical Exam Constitutional:      Appearance: Normal appearance.  Neurological:     Mental Status: She is alert.     Comments: Slight wide based gait and mild ataxia Weakness in all muscle groups in legs >arms (but both) Normal tone            Assessment & Plan:

## 2022-07-17 NOTE — Patient Instructions (Signed)
Please stop the crestor (rosuvastatin) for the next month to see if that helps your muscle strength at all

## 2022-07-20 ENCOUNTER — Ambulatory Visit: Payer: Medicare HMO | Admitting: Speech Pathology

## 2022-07-22 ENCOUNTER — Ambulatory Visit: Payer: Medicare HMO | Admitting: Speech Pathology

## 2022-07-27 ENCOUNTER — Ambulatory Visit: Payer: Medicare HMO | Admitting: Speech Pathology

## 2022-07-28 NOTE — Progress Notes (Unsigned)
Initial neurology clinic note  SERVICE DATE: 07/30/22  Reason for Evaluation: Consultation requested by Venia Carbon, MD for an opinion regarding muscle weakness. My final recommendations will be communicated back to the requesting physician by way of shared medical record or letter to requesting physician via [place] mail.  HPI: This is Ms. Catherine Green, a 81 y.o. right-handed female with a medical history of lumbar spine disease s/p surgery (~2018), HTN, HLD, CAD s/p CABG (2004), carotid stenosis s/p surgery on both sides multiple times, depression, PVD, edema (on lasix), previous smoker who presents to neurology clinic with the chief complaint of muscle weakness. The patient is accompanied by husband.  Patient has had weakness off and on for 2-3 years. The weakness is in her legs. It is especially bad when it is cold. She gets pain in her back, front of her legs, and down below knee. It is an electric pain. She can have leg soreness the day after bad electric pains. It has been getting worse. She states this has been going on for a while (not sure exactly when). She finds when she walks, things are worse. When she sits down, symptoms improve. When she is in a store walking, hanging on a cart helps her move better. She will use a walker in her condo.  Patient had one fall, on 07/07/22 when she fell at the dentist. She has almost fallen other times but grabs onto her husband.  She has a history of lumbar spine disease. She had surgery in ~2018 and has been told everything has been done that can be done. She previously got injections as well.  She takes gabapentin 400 mg every 8 hours for pain. This helps but causes some cognitive slowing for her. She takes B12 1000 mcg daily. She was previously on crestor 20 mg and zetia 10 mg daily for many years. She recently stopped the crestor but is still on the zetia due to concerns for weakness on 07/17/22. This has not changed her symptoms.   Patient  denies diplopia, ptosis, problems chewing, changes to her voice. She occasionally gets choked when eating bread or something she has to chew a lot. She occasionally coughs when drinking water. She has had this looked at with endoscopy years ago and a polyp was removed. She does not think this helped.  There are no neuromuscular respiratory weakness symptoms, particularly orthopnea>dyspnea.   Pseudobulbar affect is absent.  She endorses dry eyes and mouth. She denies bowel or bladder problems, early satiety, or postprandial bloating.  She has lost 30 pounds over the last year but stable over the last year. Patient feels this was due to taking lasix and loss of edema.  EtOH use: None  Restrictive diet? No Family history of neuropathy/myopathy/NM disease? No  Patient had an EMG in 2019, prior to symptoms in her legs (normal EMG). Patient has not done PT in a few years.   MEDICATIONS:  Outpatient Encounter Medications as of 07/30/2022  Medication Sig   albuterol (VENTOLIN HFA) 108 (90 Base) MCG/ACT inhaler Inhale 2 puffs into the lungs every 6 (six) hours as needed for wheezing or shortness of breath.   amLODipine (NORVASC) 2.5 MG tablet Take 0.5 tablets (1.25 mg total) by mouth daily.   aspirin 81 MG tablet Take 1 tablet (81 mg total) by mouth daily.   cetirizine (ZYRTEC) 10 MG tablet Take 1 tablet (10 mg total) by mouth daily. (Patient taking differently: Take 10 mg by mouth daily. prn)  Coenzyme Q10 (CO Q10) 100 MG CAPS Take 100 mg by mouth daily. prn   ezetimibe (ZETIA) 10 MG tablet TAKE 1 TABLET BY MOUTH DAILY   furosemide (LASIX) 20 MG tablet TAKE 1 TABLET BY MOUTH DAILY.   gabapentin (NEURONTIN) 400 MG capsule TAKE 1 CAPSULE BY MOUTH EVERY 8 HOURS   ketoconazole (NIZORAL) 2 % shampoo Apply topically as directed.   metoprolol succinate (TOPROL XL) 25 MG 24 hr tablet Take 0.5 tablets (12.5 mg total) by mouth at bedtime.   omeprazole (PRILOSEC) 20 MG capsule Take 1 capsule (20 mg  total) by mouth 2 (two) times daily before a meal.   potassium chloride (KLOR-CON) 10 MEQ tablet TAKE 1 TABLET BY MOUTH DAILY   sertraline (ZOLOFT) 100 MG tablet TAKE ONE AND A HALF TABLETS BY MOUTH EVERY DAY   vitamin B-12 (CYANOCOBALAMIN) 1000 MCG tablet Take 1 tablet (1,000 mcg total) by mouth daily.   rosuvastatin (CRESTOR) 20 MG tablet TAKE ONE TABLET BY MOUTH DAILY (Patient not taking: Reported on 07/30/2022)   No facility-administered encounter medications on file as of 07/30/2022.    PAST MEDICAL HISTORY: Past Medical History:  Diagnosis Date   Allergy    hay fever   Anemia    Anxiety    Back pain    Carotid artery occlusion    Cerebrovascular disease    extracranial; occlusive   Chicken pox    Coronary artery disease    Depression    Dizziness    Dizziness    DVT (deep venous thrombosis) ([facility])    Fainting spell    GERD (gastroesophageal reflux disease)    Headache    Heart murmur    Hyperlipidemia    Hypertension    Leg pain    PONV (postoperative nausea and vomiting)    severe nausea and vomiting   Pre-syncope    PVD (peripheral vascular disease) ([facility])    endarterectomy by [name]   Seasonal allergies    Shortness of breath dyspnea    wth ambulation at times   Swelling of both ankles    and abdomen; takes Lasix when needed   Thrombophlebitis    following childbirth   Ulcer     PAST SURGICAL HISTORY: Past Surgical History:  Procedure Laterality Date   APPENDECTOMY     BACK SURGERY  01/14/16   BREAST SURGERY     saline implants   CARDIAC CATHETERIZATION     CAROTID ENDARTERECTOMY  1992   CAROTID ENDARTERECTOMY Right February 13, 2005   Re-do Right CE   CATARACT EXTRACTION, BILATERAL  2013   CHOLECYSTECTOMY     CHOLECYSTECTOMY, LAPAROSCOPIC  04/02/14   Dr. Rochel Brome   CORONARY ARTERY BYPASS GRAFT  2006   x3 Dr. Prescott Gum   ENDARTERECTOMY Left 07/20/2014   Procedure: ENDARTERECTOMY CAROTID-LEFT;  Surgeon: Rosetta Posner, MD;  Location: [facility];   Service: Vascular;  Laterality: Left;   EYE SURGERY Bilateral Feb. 2013   Cataract Left eye   SEPTOPLASTY     with bilateral inferior turbinate reductions   SPINE SURGERY  12/2015   TOTAL ABDOMINAL HYSTERECTOMY     UPPER GASTROINTESTINAL ENDOSCOPY     had polyps removed from esophagus   UPPER GI ENDOSCOPY      ALLERGIES: Allergies  Allergen Reactions   Lyrica [Pregabalin] Shortness Of Breath, Swelling and Other (See Comments)    chest tight   Hydrocodone Other (See Comments)    hallucinate   Ivp Dye [Iodinated Contrast  Media] Hives and Itching        Lipitor [Atorvastatin] Nausea And Vomiting   Methylprednisolone Other (See Comments)    Severe weekness    Oxycodone Hcl Other (See Comments)    Hallucinations    Penicillin G Nausea And Vomiting   Shellfish Allergy Hives and Itching   Buspar [Buspirone]     Worsening mood   Clavulanic Acid    Prednisone    Augmentin [Amoxicillin-Pot Clavulanate] Nausea And Vomiting   Codeine Nausea And Vomiting and Other (See Comments)   Cymbalta [Duloxetine Hcl] Nausea Only    GI upset at 46m   Demerol Nausea And Vomiting   Doxycycline Nausea Only and Other (See Comments)    Weakness, sick to her stomach   Effexor [Venlafaxine] Nausea Only   Hydrochlorothiazide Other (See Comments)    Not an allergy but urinary frequency and sweating at 256m  Hydroxyzine Other (See Comments)    Excessive sweating.    Metoprolol Other (See Comments)    Slowed body down, per pt    Protonix [Pantoprazole Sodium] Nausea Only   Trazodone And Nefazodone Other (See Comments)    Sedation     FAMILY HISTORY: Family History  Problem Relation Age of Onset   Aneurysm Mother        brain   Heart disease Mother        Aneyursm    Hyperlipidemia Mother    Hypertension Mother    Varicose Veins Mother    Bleeding Disorder Mother    Heart disease Father    Cirrhosis Father    Heart attack Father    Hyperlipidemia Father    Hypertension Father     Cancer Sister        lung   Heart disease Sister        Aneurysm   Hyperlipidemia Sister    Hypertension Sister    Varicose Veins Sister    Bleeding Disorder Sister    Heart disease Brother        Before age 82 Aneurysm Brother    Deep vein thrombosis Brother    Birth defects Brother    Hyperlipidemia Brother    Hypertension Brother    Peripheral vascular disease Daughter    Hyperlipidemia Daughter    Hypertension Daughter    Hypertension Other    Breast cancer Neg Hx    Colon cancer Neg Hx     SOCIAL HISTORY: Social History   Tobacco Use   Smoking status: Former    Packs/day: 0.30    Years: 15.00    Total pack years: 4.50    Types: Cigarettes    Quit date: 08/31/1996    Years since quitting: 25.9   Smokeless tobacco: Never  Vaping Use   Vaping Use: Never used  Substance Use Topics   Alcohol use: No    Alcohol/week: 0.0 standard drinks of alcohol   Drug use: No   Social History   Social History Narrative   Lives with husband in a one level    Work - retired haEmergency planning/management officer Diet - healthy   Right handed    Caffeine- 1 cup per day.     OBJECTIVE: PHYSICAL EXAM:       07/30/2022 3:09 PM 07/30/2022 3:10 PM 07/30/2022 3:11 PM   Orthostatic BP 164/68 172/66 162/58  BP Location Left Arm Left Arm Left Arm  Patient Position Supine Sitting Standing  Cuff Size Small Small Small  Orthostatic Pulse 71  69 69  SpO2 90 % 96 % 96 %  Weight 141 lb 3.2 oz (64 kg)     General: General appearance: Awake and alert. No distress. Cooperative with exam.  Skin: No obvious rash or jaundice. HEENT: Atraumatic. Anicteric. Lungs: Non-labored breathing on room air  Extremities: No edema. No obvious deformity.  Musculoskeletal: No obvious joint swelling. Psych: Affect appropriate.  Neurological: Mental Status: Alert. Speech fluent. No pseudobulbar affect Cranial Nerves: CNII: No RAPD. Visual fields grossly intact. CNIII, IV, VI: PERRL. No nystagmus. EOMI. CN V: Facial  sensation intact bilaterally to fine touch. [place] clench strong. Jaw jerk negative. CN VII: Facial muscles symmetric and strong. No ptosis at rest. CN VIII: Hearing grossly intact bilaterally. CN IX: Hypophonia. CN X: Palate elevates symmetrically. CN XI: Full strength shoulder shrug bilaterally. CN XII: Tongue protrusion full and midline. No atrophy or fasciculations. No significant dysarthria Motor: Tone is normal. No atrophy.  Individual muscle group testing (MRC grade out of 5):  Movement     Neck flexion 5    Neck extension 5     Right Left   Shoulder abduction 4+ 4+ Limited by pain in proximal muscles  Shoulder adduction 5 5   Shoulder ext rotation 5 5   Shoulder int rotation 5 5   Elbow flexion 5 5   Elbow extension 5 5   Wrist extension 5 5   Wrist flexion 5 5   Finger abduction - FDI 5 5   Finger abduction - ADM 5 5   Finger extension 5 5   Finger distal flexion - 2/_0 Finger distal flexion - 4/_1 Thumb flexion - FPL 5 5   Thumb abduction - APB 5 5    Hip flexion 4+ 4+ Limited by pain  Hip extension 5 5   Hip adduction 5 5   Hip abduction 5 5   Knee extension 5 5   Knee flexion 5 5   Dorsiflexion 5 5   Plantarflexion 5 5   Inversion 5 5   Eversion 5 5   Great toe extension 5 5   Great toe flexion 5 5     Reflexes:  Right Left   Bicep 2+ 2+   Tricep 2+ 2+   BrRad 2+ 2+   Knee 3+ 3+ Cross adductors bilaterally  Ankle 2+ 2+ 2 beats of clonus in left ankle   Pathological Reflexes: Babinski: mute response bilaterally Hoffman: absent bilaterally Troemner: absent bilaterally Facial: Absent bilaterally Midline tap: absent Sensation: Pinprick: Patchy loss in bilateral arms and legs Vibration: Diminished in left great toe compared to right Proprioception: Intact in bilateral great toes Coordination: Intact finger-to- nose-finger bilaterally. Romberg positive. Gait: Unable to rise from chair with arms crossed unassisted. Narrow-based, very  unsteady gait.  Lab and Test Review: Internal labs: 07/17/22 labs: CK: 134 (409 four years ago) B12: 874 TSH: 1.02 ESR: 25 CBC and CMP wnl  HbA1c (02/07/22): 5.8  MRI lumbar spine wo contrast (11/12/20): FINDINGS: Segmentation: Consistent with the previous exams, L5 is a transitional vertebra, largely sacralized.   Alignment:  Normal   Vertebrae:  No fracture or primary bone lesion.   Conus medullaris and cauda equina: Conus extends to the L1 level. Conus and cauda equina appear normal.   Paraspinal and other soft tissues: Negative. Insignificant small renal cysts.   Disc levels:   T12-L1 and L1-2: Normal.   L2-3: Mild bulging of the disc. Mild ligamentous prominence. Mild  canal narrowing but no neural compression. No change since 2020.   L3-4: Mild bulging of the disc. Mild ligamentous prominence. Mild canal narrowing but no neural compression. No change since 2020.   L4-5: Previous partial in ectomy on the right. Moderate bulging of the disc. Mild facet hypertrophy. Mild narrowing of the lateral recesses but without visible neural compression. No change since 2020.   L5-S1: Transitional level.  Rudimentary normal disc.  No stenosis.   IMPRESSION: 1. Consistent with the previous exams, L5 is a transitional vertebra, largely sacralized. 2. No change since the study 05/25/2019. Mild chronic degenerative changes at L2-3, L3-4 and L4-5 with disc bulges and ligamentous prominence but no apparent neural compression. Findings could contribute to back pain. Previous right laminotomy L4-5.  MRI brain w/wo contrast (08/03/18): FINDINGS: Brain: IAC protocol was performed including thin section imaging through the posterior fossa before and after intravenous contrast. Seventh and 8th cranial nerves are normal. Negative for vestibular schwannoma. Basilar cisterns normal. Brainstem and cerebellum normal. Mastoid sinus is clear bilaterally. No enhancing mass in  the posterior fossa or temporal bone.   Ventricle size and cerebral volume normal. Negative for acute infarct. Scattered white matter hyperintensities bilaterally compatible with chronic microvascular ischemia.   Vascular: Normal arterial flow voids. Normal venous enhancement without thrombosis.   Skull and upper cervical spine: Negative   Sinuses/Orbits: Paranasal sinuses clear. Mastoid sinus clear. Bilateral cataract surgery.   Other: None   IMPRESSION: No cause for hearing loss. Negative for vestibular schwannoma or other mass in the posterior fossa.   Mild chronic microvascular ischemic change in the cerebral white matter bilaterally.  EMG (05/26/18 - [facility]): FINDINGS: NERVE CONDUCTION STUDY: Bilateral tibial and peroneal motor responses are normal. Bilateral sural and superficial peroneal sensory responses are normal. Bilateral tibial F wave latencies are normal. NEEDLE ELECTROMYOGRAPHY: Needle examination of left upper extremity and left lower extremity is unremarkable. IMPRESSION:  This is a normal study. No electrodiagnostic evidence of large fiber neuropathy or myopathy at this time.   ASSESSMENT: Catherine Green is a 81 y.o. female who presents for evaluation of leg weakness and pain. She has a relevant medical history of lumbar spine disease s/p surgery (~2018), HTN, HLD, CAD s/p CABG (2004), carotid stenosis s/p surgery on both sides multiple times, depression, PVD, edema (on lasix), previous smoker. Her neurological examination is pertinent for pain in proximal muscle testing and ?weakness. She is hyperreflexic, especially in the lower extremities. Sensation was diminished in a patchy fashion without clear distribution for localization. Orthostatic vitals were negative for orthostatic hypotension. Available diagnostic data is significant for normal EMG in 2019, MRI lumbar spine from 10/2020 that showed no significant compression. Lab work including B12, HbA1c, and CK are  unremarkable.   The etiology of patient's symptoms are currently unclear. Given her previous history of lumbar spine disease and symptoms that worsen with standing and improve with sitting, consistent with neurogenic claudication, the lumbar spine is one possible localization. Her use of statin with zetia can increase the chance of myalgias and inflammatory myopathy, and given her ?proximal weakness and pain, this could be another etiology. I would expect her CK to be elevated though, and it is actually lower recently than 4 years ago. Given her hyperreflexia, a cervical or thoracic spine localization is also possible. I will start with blood work and EMG and may expand work up if unrevealing. Patient would benefit for PT for strengthening and help with balance.  PLAN: -Blood work: HMG CoA reductase  ab, vit E, copper, B1, IFE, ANA, ENA -EMG: RLE and RUE (myopathy and radiculopathy) -Will consider MRI cervical and thoracic spine if other work up is unrevealing -Continue gabapentin -Physical therapy ([facility])  -Return to clinic in 3 months  The impression above as well as the plan as outlined below were extensively discussed with the patient (in the company of husband) who voiced understanding. All questions were answered to their satisfaction.  The patient was counseled on pertinent fall precautions per the printed material provided today, and as noted under the "Patient Instructions" section below.  When available, results of the above investigations and possible further recommendations will be communicated to the patient via telephone/MyChart. Patient to call office if not contacted after expected testing turnaround time.   Total time spent reviewing records, interview, history/exam, documentation, and coordination of care on day of encounter:  75 min   Thank you for allowing me to participate in patient's care.  If I can answer any additional questions, I would be pleased to do  so.  Kai Levins, MD   CC: Tonia Ghent, MD [place] [address]  CC: Referring provider: Venia Carbon, MD [address] [place],  [place] [address]

## 2022-07-30 ENCOUNTER — Other Ambulatory Visit: Payer: Medicare HMO

## 2022-07-30 ENCOUNTER — Ambulatory Visit: Payer: Medicare HMO | Admitting: Neurology

## 2022-07-30 ENCOUNTER — Encounter: Payer: Self-pay | Admitting: Neurology

## 2022-07-30 VITALS — Wt 141.2 lb

## 2022-07-30 DIAGNOSIS — M79604 Pain in right leg: Secondary | ICD-10-CM

## 2022-07-30 DIAGNOSIS — R29898 Other symptoms and signs involving the musculoskeletal system: Secondary | ICD-10-CM | POA: Diagnosis not present

## 2022-07-30 DIAGNOSIS — M545 Low back pain, unspecified: Secondary | ICD-10-CM

## 2022-07-30 DIAGNOSIS — M79605 Pain in left leg: Secondary | ICD-10-CM

## 2022-07-30 DIAGNOSIS — R2681 Unsteadiness on feet: Secondary | ICD-10-CM

## 2022-07-30 NOTE — Patient Instructions (Signed)
I am not sure the cause of your leg weakness and pain and imbalance.  I would like to investigate further with: -Blood work today -Muscle and nerve test called an EMG (see more information below)  I will be in touch with you when I have your results.  I want to send you to physical therapy to help you get stronger and help with your balance.  Continue gabapentin for your pain.  I want to see you back in clinic in about 3 months. Please let me know if you have any questions or concerns in the meantime.   The physicians and staff at [facility] are committed to providing excellent care. You may receive a survey requesting feedback about your experience at our office. We strive to receive "very good" responses to the survey questions. If you feel that your experience would prevent you from giving the office a "very good " response, please contact our office to try to remedy the situation. We may be reached at [phone]. Thank you for taking the time out of your busy day to complete the survey.  Kai Levins, MD [facility]  ELECTROMYOGRAM AND NERVE CONDUCTION STUDIES (EMG/NCS) INSTRUCTIONS  How to Prepare The neurologist conducting the EMG will need to know if you have certain medical conditions. Tell the neurologist and other EMG lab personnel if you: Have a pacemaker or any other electrical medical device Take blood-thinning medications Have hemophilia, a blood-clotting disorder that causes prolonged bleeding Bathing Take a shower or bath shortly before your exam in order to remove oils from your skin. Don't apply lotions or creams before the exam.  What to Expect You'll likely be asked to change into a hospital gown for the procedure and lie down on an examination table. The following explanations can help you understand what will happen during the exam.  Electrodes. The neurologist or a technician places surface electrodes at various locations on your skin depending on  where you're experiencing symptoms. Or the neurologist may insert needle electrodes at different sites depending on your symptoms.  Sensations. The electrodes will at times transmit a tiny electrical current that you may feel as a twinge or spasm. The needle electrode may cause discomfort or pain that usually ends shortly after the needle is removed. If you are concerned about discomfort or pain, you may want to talk to the neurologist about taking a short break during the exam.  Instructions. During the needle EMG, the neurologist will assess whether there is any spontaneous electrical activity when the muscle is at rest - activity that isn't present in healthy muscle tissue - and the degree of activity when you slightly contract the muscle.  He or she will give you instructions on resting and contracting a muscle at appropriate times. Depending on what muscles and nerves the neurologist is examining, he or she may ask you to change positions during the exam.  After your EMG You may experience some temporary, minor bruising where the needle electrode was inserted into your muscle. This bruising should fade within several days. If it persists, contact your primary care doctor.   Preventing Falls at [facility] are common, often dreaded events in the lives of older people. Aside from the obvious injuries and even death that may result, fall can cause wide-ranging consequences including loss of independence, mental decline, decreased activity and mobility. Younger people are also at risk of falling, especially those with chronic illnesses and fatigue.  Ways to reduce risk for falling  Examine diet and medications. Warm foods and alcohol dilate blood vessels, which can lead to dizziness when standing. Sleep aids, antidepressants and pain medications can also increase the likelihood of a fall.  Get a vision exam. Poor vision, cataracts and glaucoma increase the chances of falling.  Check foot gear. Shoes  should fit snugly and have a sturdy, nonskid sole and a broad, low heel  Participate in a physician-approved exercise program to build and maintain muscle strength and improve balance and coordination. Programs that use ankle weights or stretch bands are excellent for muscle-strengthening. Water aerobics programs and low-impact Tai Chi programs have also been shown to improve balance and coordination.  Increase vitamin D intake. Vitamin D improves muscle strength and increases the amount of calcium the body is able to absorb and deposit in bones.  How to prevent falls from common hazards Floors - Remove all loose wires, cords, and throw rugs. Minimize clutter. Make sure rugs are anchored and smooth. Keep furniture in its usual place.  Chairs -- Use chairs with straight backs, armrests and firm seats. Add firm cushions to existing pieces to add height.  Bathroom - Install grab bars and non-skid tape in the tub or shower. Use a bathtub transfer bench or a shower chair with a back support Use an elevated toilet seat and/or safety rails to assist standing from a low surface. Do not use towel racks or bathroom tissue holders to help you stand.  Lighting - Make sure halls, stairways, and entrances are well-lit. Install a night light in your bathroom or hallway. Make sure there is a light switch at the top and bottom of the staircase. Turn lights on if you get up in the middle of the night. Make sure lamps or light switches are within reach of the bed if you have to get up during the night.  Kitchen - Install non-skid rubber mats near the sink and stove. Clean spills immediately. Store frequently used utensils, pots, pans between waist and eye level. This helps prevent reaching and bending. Sit when getting things out of lower cupboards.  Living room/ Bedrooms - Place furniture with wide spaces in between, giving enough room to move around. Establish a route through the living room that gives you something  to hold onto as you walk.  Stairs - Make sure treads, rails, and rugs are secure. Install a rail on both sides of the stairs. If stairs are a threat, it might be helpful to arrange most of your activities on the lower level to reduce the number of times you must climb the stairs.  Entrances and doorways - Install metal handles on the walls adjacent to the doorknobs of all doors to make it more secure as you travel through the doorway.  Tips for maintaining balance Keep at least one hand free at all times. Try using a backpack or fanny pack to hold things rather than carrying them in your hands. Never carry objects in both hands when walking as this interferes with keeping your balance.  Attempt to swing both arms from front to back while walking. This might require a conscious effort if Parkinson's disease has diminished your movement. It will, however, help you to maintain balance and posture, and reduce fatigue.  Consciously lift your feet off of the ground when walking. Shuffling and dragging of the feet is a common culprit in losing your balance.  When trying to navigate turns, use a "U" technique of facing forward and making a wide turn,  rather than pivoting sharply.  Try to stand with your feet shoulder-length apart. When your feet are close together for any length of time, you increase your risk of losing your balance and falling.  Do one thing at a time. Don't try to walk and accomplish another task, such as reading or looking around. The decrease in your automatic reflexes complicates motor function, so the less distraction, the better.  Do not wear rubber or gripping soled shoes, they might "catch" on the floor and cause tripping.  Move slowly when changing positions. Use deliberate, concentrated movements and, if needed, use a grab bar or walking aid. Count 15 seconds between each movement. For example, when rising from a seated position, wait 15 seconds after standing to begin  walking.  If balance is a continuous problem, you might want to consider a walking aid such as a cane, walking stick, or walker. Once you've mastered walking with help, you might be ready to try it on your own again.

## 2022-07-31 ENCOUNTER — Ambulatory Visit: Payer: Medicare HMO | Admitting: Speech Pathology

## 2022-08-01 LAB — ANA+ENA+DNA/DS+SCL 70+SJOSSA/B
ANA Titer 1: NEGATIVE
ENA RNP Ab: 0.7 AI (ref 0.0–0.9)
ENA SM Ab Ser-aCnc: <0.2 AI (ref 0.0–0.9)
ENA SSA (RO) Ab: <0.2 AI (ref 0.0–0.9)
ENA SSB (LA) Ab: <0.2 AI (ref 0.0–0.9)
Scleroderma (Scl-70) (ENA) Antibody, IgG: <0.2 AI (ref 0.0–0.9)
dsDNA Ab: <1 IU/mL (ref 0–9)

## 2022-08-01 LAB — SPECIMEN STATUS REPORT

## 2022-08-03 ENCOUNTER — Ambulatory Visit: Payer: Medicare HMO | Admitting: Neurology

## 2022-08-05 ENCOUNTER — Ambulatory Visit: Payer: Medicare HMO | Admitting: Speech Pathology

## 2022-08-06 ENCOUNTER — Other Ambulatory Visit: Payer: Self-pay | Admitting: Cardiovascular Disease

## 2022-08-06 LAB — HMGCR AB (IGG): HMGCR AB (IGG): <2 CU (ref <20)

## 2022-08-06 LAB — IMMUNOFIXATION ELECTROPHORESIS
IgG (Immunoglobin G), Serum: 546 mg/dL — ABNORMAL LOW (ref 600–1540)
IgM, Serum: 59 mg/dL (ref 50–300)
Immunoglobulin A: 157 mg/dL (ref 70–320)

## 2022-08-06 LAB — VITAMIN E
Gamma-Tocopherol (Vit E): 2.9 mg/L (ref <=4.3)
Vitamin E (Alpha Tocopherol): 11 mg/L (ref 5.7–19.9)

## 2022-08-06 LAB — COPPER, SERUM: Copper: 135 ug/dL (ref 70–175)

## 2022-08-06 LAB — VITAMIN B1: Vitamin B1 (Thiamine): 11 nmol/L (ref 8–30)

## 2022-08-07 ENCOUNTER — Ambulatory Visit: Payer: Medicare HMO | Admitting: Speech Pathology

## 2022-08-12 ENCOUNTER — Ambulatory Visit: Payer: Medicare HMO | Admitting: Speech Pathology

## 2022-08-14 ENCOUNTER — Ambulatory Visit: Payer: Medicare HMO | Admitting: Speech Pathology

## 2022-08-19 ENCOUNTER — Ambulatory Visit: Payer: Medicare HMO | Admitting: Speech Pathology

## 2022-08-21 ENCOUNTER — Ambulatory Visit: Payer: Medicare HMO | Admitting: Speech Pathology

## 2022-08-27 ENCOUNTER — Ambulatory Visit: Payer: Medicare HMO | Admitting: Speech Pathology

## 2022-08-30 ENCOUNTER — Other Ambulatory Visit: Payer: Self-pay | Admitting: Family Medicine

## 2022-08-30 DIAGNOSIS — I5033 Acute on chronic diastolic (congestive) heart failure: Secondary | ICD-10-CM

## 2022-09-01 ENCOUNTER — Telehealth: Payer: Self-pay

## 2022-09-01 NOTE — Progress Notes (Signed)
Reason for Encounter: Appointment Reminder  Contacted patient on 09/01/2022   Medications: Outpatient Encounter Medications as of 09/01/2022  Medication Sig   albuterol (VENTOLIN HFA) 108 (90 Base) MCG/ACT inhaler Inhale 2 puffs into the lungs every 6 (six) hours as needed for wheezing or shortness of breath.   amLODipine (NORVASC) 2.5 MG tablet Take 0.5 tablets (1.25 mg total) by mouth daily.   aspirin 81 MG tablet Take 1 tablet (81 mg total) by mouth daily.   cetirizine (ZYRTEC) 10 MG tablet Take 1 tablet (10 mg total) by mouth daily. (Patient taking differently: Take 10 mg by mouth daily. prn)   Coenzyme Q10 (CO Q10) 100 MG CAPS Take 100 mg by mouth daily. prn   ezetimibe (ZETIA) 10 MG tablet TAKE 1 TABLET BY MOUTH DAILY   furosemide (LASIX) 20 MG tablet TAKE 1 TABLET BY MOUTH DAILY.   gabapentin (NEURONTIN) 400 MG capsule TAKE 1 CAPSULE BY MOUTH EVERY 8 HOURS   ketoconazole (NIZORAL) 2 % shampoo Apply topically as directed.   metoprolol succinate (TOPROL XL) 25 MG 24 hr tablet Take 0.5 tablets (12.5 mg total) by mouth at bedtime.   omeprazole (PRILOSEC) 20 MG capsule Take 1 capsule (20 mg total) by mouth 2 (two) times daily before a meal.   potassium chloride (KLOR-CON) 10 MEQ tablet TAKE 1 TABLET BY MOUTH DAILY   rosuvastatin (CRESTOR) 20 MG tablet TAKE ONE TABLET BY MOUTH DAILY (Patient not taking: Reported on 07/30/2022)   sertraline (ZOLOFT) 100 MG tablet TAKE ONE AND A HALF TABLETS BY MOUTH EVERY DAY   vitamin B-12 (CYANOCOBALAMIN) 1000 MCG tablet Take 1 tablet (1,000 mcg total) by mouth daily.   No facility-administered encounter medications on file as of 09/01/2022.   Lab Results  Component Value Date/Time   HGBA1C 5.8 (A) 02/07/2021 09:25 AM   HGBA1C 5.8 (A) 11/13/2019 09:26 AM   HGBA1C 7.1 (H) 02/01/2019 09:07 AM   HGBA1C 6.6 (H) 11/03/2018 10:57 AM   MICROALBUR 11.7 (H) 11/03/2018 10:57 AM    BP Readings from Last 3 Encounters:  07/17/22 (!) 150/74  05/14/22 110/80   04/21/22 (!) 158/64    Unsuccessful attempt to reach patient. Left patient message reminding patient of appointment.  Patient contacted to confirm telephone appointment with Charlene Brooke, PharmD, on 09/02/2022 at 3:00.  Star Rating Drugs:  Medication:  Last Fill: Day Supply Rosuvastatin 20 mg    05/07/22          100   Care Gaps: Annual wellness visit in last year? Yes 05/11/22   Charlene Brooke, CPP notified  Marijean Niemann, [place] Clinical Pharmacy Assistant [phone]

## 2022-09-02 ENCOUNTER — Ambulatory Visit: Payer: Medicare HMO | Admitting: Speech Pathology

## 2022-09-02 ENCOUNTER — Encounter: Payer: Medicare HMO | Admitting: Pharmacist

## 2022-09-02 ENCOUNTER — Telehealth: Payer: Self-pay

## 2022-09-02 NOTE — Progress Notes (Signed)
  Chronic Care Management   Note  09/02/2022 Name: Catherine Green MRN: [id number] DOB: [birth year]  Catherine Green is a 82 y.o. year old female who is a primary care patient of Tonia Ghent, MD. I reached out to Stephenie Acres by phone today in response to a referral sent by Catherine Green PCP.  Catherine Green was given information about Chronic Care Management services today including:  CCM service includes personalized support from designated clinical staff supervised by the physician, including individualized plan of care and coordination with other care providers 24/7 contact phone numbers for assistance for urgent and routine care needs. Service will only be billed when office clinical staff spend 20 minutes or more in a month to coordinate care. Only one practitioner may furnish and bill the service in a calendar month. The patient may stop CCM services at amy time (effective at the end of the month) by phone call to the office staff. The patient will be responsible for cost sharing (co-pay) or up to 20% of the service fee (after annual deductible is met)  Catherine Green  agreedto scheduling an appointment with the CCM RN Case Manager and Pharm D   Follow up plan: Patient agreed to scheduled appointment with RN Case Manager on 09/09/2022 and Pharm D 09/16/2022(date/time).   Noreene Larsson, [place], [place] [address] Direct Dial: [phone] [email or web address]

## 2022-09-04 ENCOUNTER — Ambulatory Visit: Payer: Medicare HMO | Admitting: Speech Pathology

## 2022-09-09 ENCOUNTER — Ambulatory Visit: Payer: Medicare HMO

## 2022-09-09 ENCOUNTER — Ambulatory Visit (INDEPENDENT_AMBULATORY_CARE_PROVIDER_SITE_OTHER): Payer: Medicare HMO

## 2022-09-09 ENCOUNTER — Telehealth: Payer: Medicare HMO

## 2022-09-09 ENCOUNTER — Ambulatory Visit: Payer: Medicare HMO | Admitting: Speech Pathology

## 2022-09-09 DIAGNOSIS — I5033 Acute on chronic diastolic (congestive) heart failure: Secondary | ICD-10-CM

## 2022-09-09 DIAGNOSIS — I10 Essential (primary) hypertension: Secondary | ICD-10-CM

## 2022-09-09 NOTE — Plan of Care (Signed)
Chronic Care Management Provider Comprehensive Care Plan    09/09/2022 Name: Catherine Green MRN: [id number] DOB: [birth year]  Referral to Chronic Care Management (CCM) services was placed by Provider:  Dr. Elsie Green on Date: 08-30-2022.  Chronic Condition 1: HF Provider Assessment and Plan HFpEF/pulmonary hypertension: She is euvolemic and well compensated and remains on low-dose furosemide 20 mg daily.  Recent labs stable.   Expected Outcome/Goals Addressed This Visit (Provider CCM goals/Provider Assessment and plan   CCM (HF)  EXPECTED OUTCOME:  MONITOR,SELF- MANAGE AND REDUCE SYMPTOMS OF HF   Symptom Management Condition 1: Take all medications as prescribed Attend all scheduled provider appointments Call provider office for new concerns or questions  call the Suicide and Crisis Green: 988 call the Catherine Green: [phone] or TTY: [phone] TTY [phone]) to talk to a trained counselor call 1-800-273-TALK (toll free, 24 hour hotline) if experiencing a Mental Health or Catherine Green  call office if I gain more than 2 pounds in one day or 5 pounds in one week use salt in moderation watch for swelling in feet, ankles and legs every day weigh myself daily develop a rescue plan follow rescue plan if symptoms flare-up eat more whole grains, fruits and vegetables, lean meats and healthy fats track symptoms and what helps feel better or worse  Chronic Condition 2: HTN Provider Assessment and Plan Labile hypertension with orthostatic hypotension: Blood pressure is reasonably controlled in the office.  She will need some degree of permissive hypertension.  Blood pressure has been well controlled at home.  She remains on Toprol-XL 12.5 mg nightly and amlodipine 2.5 mg in the morning.  Continue compression stockings and adequate hydration.   Expected Outcome/Goals Addressed This Visit (Provider CCM goals/Provider Assessment and plan    CCM (HYPERTENSION)  EXPECTED OUTCOME:  MONITOR,SELF- MANAGE AND REDUCE SYMPTOMS OF HYPERTENSION   Symptom Management Condition 2: Take all medications as prescribed Attend all scheduled provider appointments Call provider office for new concerns or questions  call the Suicide and Crisis Green: 988 call the Catherine Green: [phone] or TTY: [phone] TTY [phone]) to talk to a trained counselor call 1-800-273-TALK (toll free, 24 hour hotline) if experiencing a Mental Health or Catherine Green  check blood pressure 3 times per week learn about high blood pressure call doctor for signs and symptoms of high blood pressure keep all doctor appointments take medications for blood pressure exactly as prescribed report new symptoms to your doctor eat more whole grains, fruits and vegetables, lean meats and healthy fats  Problem List Patient Active Problem List   Diagnosis Date Noted   Muscle weakness (generalized) 07/17/2022   Mucous cyst of finger 05/17/2022   Acute recurrent frontal sinusitis 04/21/2021   Impacted cerumen of right ear 04/21/2021   History of diabetes mellitus 02/09/2021   Scalp irritation 11/13/2020   Seasonal allergies 07/12/2019   Anemia 02/05/2019   Rash 09/04/2018   GERD (gastroesophageal reflux disease) 09/04/2018   Elevated CK 07/30/2018   Neuropathy 04/24/2018   Health care maintenance 01/19/2018   Medicare annual wellness visit, initial 01/19/2018   Grief 10/07/2017   Sweating abnormality 05/28/2017   Insomnia 05/28/2017   Fatigue 01/15/2017   Dysuria 05/06/2016   Skin lesion 03/16/2016   Aftercare following surgery of the circulatory system 02/18/2016   Spinal stenosis of lumbar region 08/21/2015   Labile blood pressure 05/17/2015   Leg pain, bilateral 02/08/2015   SOB (shortness of breath) 02/08/2015  Cough 01/17/2015   Low back pain 11/05/2014   Benign paroxysmal positional vertigo 09/04/2014    Carotid stenosis 07/03/2014   Preoperative cardiovascular examination 03/07/2014   Esophageal dysphagia 12/25/2013   Acute on chronic diastolic CHF (congestive heart failure) (Catherine Green) 11/23/2013   Palpitations 11/23/2013   Urticaria 04/05/2013   Screening for colon cancer 05/27/2012   Hyperlipidemia 05/04/2012   Dizziness 04/11/2012   Anxiety and depression 03/10/2012   Carotid artery disease (Catherine Green) 12/22/2011   Other and unspecified hyperlipidemia 10/08/2009   Essential hypertension 10/08/2009   CAD, ARTERY BYPASS GRAFT 10/08/2009   PVD 10/08/2009   SYNCOPE, HX OF 10/08/2009   THROMBOPHLEBITIS, HX OF 10/08/2009    Medication Management  Current Outpatient Medications:    rosuvastatin (CRESTOR) 20 MG tablet, TAKE ONE TABLET BY MOUTH DAILY, Disp: 90 tablet, Rfl: 1   albuterol (VENTOLIN HFA) 108 (90 Base) MCG/ACT inhaler, Inhale 2 puffs into the lungs every 6 (six) hours as needed for wheezing or shortness of breath., Disp: 8 g, Rfl: 0   amLODipine (NORVASC) 2.5 MG tablet, Take 0.5 tablets (1.25 mg total) by mouth daily., Disp: , Rfl:    aspirin 81 MG tablet, Take 1 tablet (81 mg total) by mouth daily., Disp: 30 tablet, Rfl:    cetirizine (ZYRTEC) 10 MG tablet, Take 1 tablet (10 mg total) by mouth daily. (Patient taking differently: Take 10 mg by mouth daily. prn), Disp: , Rfl:    Coenzyme Q10 (CO Q10) 100 MG CAPS, Take 100 mg by mouth daily. prn, Disp: , Rfl:    ezetimibe (ZETIA) 10 MG tablet, TAKE 1 TABLET BY MOUTH DAILY, Disp: 90 tablet, Rfl: 0   furosemide (LASIX) 20 MG tablet, TAKE 1 TABLET BY MOUTH DAILY., Disp: 90 tablet, Rfl: 3   gabapentin (NEURONTIN) 400 MG capsule, TAKE 1 CAPSULE BY MOUTH EVERY 8 HOURS, Disp: 90 capsule, Rfl: 5   ketoconazole (NIZORAL) 2 % shampoo, Apply topically as directed., Disp: , Rfl:    metoprolol succinate (TOPROL XL) 25 MG 24 hr tablet, Take 0.5 tablets (12.5 mg total) by mouth at bedtime., Disp: 45 tablet, Rfl: 3   omeprazole (PRILOSEC) 20 MG  capsule, Take 1 capsule (20 mg total) by mouth 2 (two) times daily before a meal., Disp: , Rfl:    potassium chloride (KLOR-CON) 10 MEQ tablet, TAKE 1 TABLET BY MOUTH DAILY, Disp: 90 tablet, Rfl: 1   sertraline (ZOLOFT) 100 MG tablet, TAKE ONE AND A HALF TABLETS BY MOUTH EVERY DAY, Disp: 150 tablet, Rfl: 2   vitamin B-12 (CYANOCOBALAMIN) 1000 MCG tablet, Take 1 tablet (1,000 mcg total) by mouth daily., Disp: , Rfl:   Cognitive Assessment Identity Confirmed: : Name; DOB Cognitive Status: Normal   Functional Assessment Hearing Difficulty or Deaf: yes Hearing Management: has hearing aides Wear Glasses or Blind: yes Vision Management: wears glasses for distance Concentrating, Remembering or Making Decisions Difficulty (CP): no Difficulty Communicating: no Difficulty Eating/Swallowing: no Walking or Climbing Stairs Difficulty: yes Walking or Climbing Stairs: ambulation difficulty, requires equipment Mobility Management: uses a walker when ambulating, has a lot of pain in her bilateral legs- having testing Dressing/Bathing Difficulty: no Doing Errands Independently Difficulty (such as shopping) (CP): no Change in Functional Status Since Onset of Current Illness/Injury: no   Caregiver Assessment  Primary Source of Support/Comfort: spouse Name of Support/Comfort Primary Source: Courtlynn Holloman- husband People in Home: spouse Name(s) of People in Home: Jeneen Rinks- husband Family Caregiver if Needed: spouse Family Caregiver Names: Jeneen Rinks Primary Roles/Responsibilities: retired Expected Impact of  Illness/Hospitalization: the patient is concerned about her leg and back pain and is wanting to find out something to help this   Planned Interventions  Basic overview and discussion of pathophysiology of Heart Failure reviewed Provided education on low sodium diet. The patient states compliance with dietary restrictions Reviewed Heart Failure Action Plan in depth  Assessed need for readable accurate  scales in home. Has scales in her home Provided education about placing scale on hard, flat surface Advised patient to weigh each morning after emptying bladder Discussed importance of daily weight and advised patient to weigh and record daily. The patient states she does not weigh daily. The patient states that she weighs a couple of times a week. Education on the benefits of daily weights and monitoring for changes in her swelling/edema and monitoring for heart failure exacerbations. Ask the patient to call the office for +2/3 pounds in one day or +5 pounds in one week. Will continue to monitor Review of sx and sx of heart failure and ask the patient to monitor for acute changes or onset:  F- Fatigue  A- Activity intolerance  C- Chest congestion or cough  E- Edema or Swelling in feet, legs, abdomen  S- Shortness of breath Reviewed role of diuretics in prevention of fluid overload and management of heart failure Discussed the importance of keeping all appointments with provider Provided patient with education about the role of exercise in the management of heart failure Advised patient to discuss changes in HF, acute onset of swelling or edema noted, and other sx and sx of HF with provider Screening for signs and symptoms of depression related to chronic disease state  Assessed social determinant of health barriers EF% 60 to 65% 09-15-2021 Evaluation of current treatment plan related to hypertension self management and patient's adherence to plan as established by provider. The patient has a cardiologist that she sees on a regular basis. She does check her blood pressures at home and says they are usually good. Does experience headaches at times. Ask the patient to take her blood pressures when she is experiencing headaches. Education and support given;   Provided education to patient re: stroke prevention, s/s of heart attack and stroke; Reviewed prescribed diet heart healthy diet. Review and  education provided. The patient watches her sodium content. Says that she has an appetite sometimes and sometimes she does not. She denies any issues with dietary restrictions. Reviewed medications with patient and discussed importance of compliance. The patient is compliant with medications and has worked with pharm D in the past. Has upcoming appointment with the pharm D on 09-16-2022;  Counseled on the importance of exercise goals with target of 150 minutes per week Discussed plans with patient for ongoing care management follow up and provided patient with direct contact information for care management team; Advised patient, providing education and rationale, to monitor blood pressure daily and record, calling PCP for findings outside established parameters. The patient does take her blood pressures at home. No readings available at the time of the call. Discussed the goals of blood pressures: <010 systolic and <27 diastolic;  Reviewed scheduled/upcoming provider appointments including: No upcoming with the pcp, does have testing coming up for leg and back pain, knows to call pcp for changes Advised patient to discuss changes in blood pressures or heart health with provider; Provided education on prescribed diet heart healthy diet ;  Discussed complications of poorly controlled blood pressure such as heart disease, stroke, circulatory complications, vision complications, kidney impairment,  sexual dysfunction;  Screening for signs and symptoms of depression related to chronic disease state;  Assessed social determinant of health barriers;     Interaction and coordination with outside resources, practitioners, and providers See CCM Referral  Care Plan: Available in MyChart

## 2022-09-09 NOTE — Chronic Care Management (AMB) (Signed)
Chronic Care Management   CCM RN Visit Note  09/09/2022 Name: Catherine Green MRN: [id number] DOB: 11/10/1940  Subjective: Catherine Green is a 82 y.o. year old female who is a primary care patient of Tonia Ghent, MD. The patient was referred to the Chronic Care Management team for assistance with care management needs subsequent to provider initiation of CCM services and plan of care.    Today's Visit:  Engaged with patient by telephone for initial visit.     SDOH Interventions Today    Flowsheet Row Most Recent Value  SDOH Interventions   Food Insecurity Interventions Intervention Not Indicated  Housing Interventions Intervention Not Indicated  Transportation Interventions Intervention Not Indicated  Utilities Interventions Intervention Not Indicated  Alcohol Usage Interventions Intervention Not Indicated (Score <7)  Financial Strain Interventions Intervention Not Indicated  Physical Activity Interventions Other (Comments)  [the patient is limited in her ability to do exercises, the patient has back and leg pain, uses a walker to ambulate]  Stress Interventions Intervention Not Indicated  Social Connections Interventions Intervention Not Indicated, Other (Comment)  [has good support system from her family and friends]         Goals Addressed             This Visit's Progress    CCM Expected Outcome:  Monitor, Self-Manage and Reduce Symptoms of Heart Failure       Current Barriers:  Knowledge Deficits related to the importance of daily weights in the effective management of HF Care Coordination needs related to [facility] pharmacy and resources needed for effective medication management  in a patient with HF Chronic Disease Management support and education needs related to effective management of HF Wt Readings from Last 3 Encounters:  07/30/22 141 lb 3.2 oz (64 kg)  07/17/22 138 lb (62.6 kg)  05/14/22 142 lb (64.4 kg)     Planned Interventions: Basic overview and  discussion of pathophysiology of Heart Failure reviewed Provided education on low sodium diet. The patient states compliance with dietary restrictions Reviewed Heart Failure Action Plan in depth  Assessed need for readable accurate scales in home. Has scales in her home Provided education about placing scale on hard, flat surface Advised patient to weigh each morning after emptying bladder Discussed importance of daily weight and advised patient to weigh and record daily. The patient states she does not weigh daily. The patient states that she weighs a couple of times a week. Education on the benefits of daily weights and monitoring for changes in her swelling/edema and monitoring for heart failure exacerbations. Ask the patient to call the office for +2/3 pounds in one day or +5 pounds in one week. Will continue to monitor Review of sx and sx of heart failure and ask the patient to monitor for acute changes or onset:  F- Fatigue  A- Activity intolerance  C- Chest congestion or cough  E- Edema or Swelling in feet, legs, abdomen  S- Shortness of breath Reviewed role of diuretics in prevention of fluid overload and management of heart failure Discussed the importance of keeping all appointments with provider Provided patient with education about the role of exercise in the management of heart failure Advised patient to discuss changes in HF, acute onset of swelling or edema noted, and other sx and sx of HF with provider Screening for signs and symptoms of depression related to chronic disease state  Assessed social determinant of health barriers EF% 60 to 65% 09-15-2021  Symptom Management: Take  medications as prescribed   Attend all scheduled provider appointments Call provider office for new concerns or questions  call the Suicide and Crisis Lifeline: 988 call the [place] National Suicide Prevention Lifeline: (904)375-1423 or TTY: (775) 408-5398 TTY 516-578-5163) to talk to a trained  counselor call 1-800-273-TALK (toll free, 24 hour hotline) if experiencing a Mental Health or [place]  call office if I gain more than 2 pounds in one day or 5 pounds in one week use salt in moderation watch for swelling in feet, ankles and legs every day weigh myself daily develop a rescue plan follow rescue plan if symptoms flare-up eat more whole grains, fruits and vegetables, lean meats and healthy fats track symptoms and what helps feel better or worse dress right for the weather, hot or cold  Follow Up Plan: Telephone follow up appointment with care management team member scheduled for: 11-04-2022 at 0900 am       CCM Expected Outcome:  Monitor, Self-Manage, and Reduce Symptoms of Hypertension       Current Barriers:  Knowledge Deficits related to consistent measurement of blood pressures and to report blood pressures that are consistently out of range Chronic Disease Management support and education needs related to effective management of HTN BP Readings from Last 3 Encounters:  07/17/22 (!) 150/74  05/14/22 110/80  04/21/22 (!) 158/64     Planned Interventions: Evaluation of current treatment plan related to hypertension self management and patient's adherence to plan as established by provider. The patient has a cardiologist that she sees on a regular basis. She does check her blood pressures at home and says they are usually good. Does experience headaches at times. Ask the patient to take her blood pressures when she is experiencing headaches. Education and support given;   Provided education to patient re: stroke prevention, s/s of heart attack and stroke; Reviewed prescribed diet heart healthy diet. Review and education provided. The patient watches her sodium content. Says that she has an appetite sometimes and sometimes she does not. She denies any issues with dietary restrictions. Reviewed medications with patient and discussed importance of compliance.  The patient is compliant with medications and has worked with pharm D in the past. Has upcoming appointment with the pharm D on 09-16-2022;  Counseled on the importance of exercise goals with target of 150 minutes per week Discussed plans with patient for ongoing care management follow up and provided patient with direct contact information for care management team; Advised patient, providing education and rationale, to monitor blood pressure daily and record, calling PCP for findings outside established parameters. The patient does take her blood pressures at home. No readings available at the time of the call. Discussed the goals of blood pressures: <179 systolic and <15 diastolic;  Reviewed scheduled/upcoming provider appointments including: No upcoming with the pcp, does have testing coming up for leg and back pain, knows to call pcp for changes Advised patient to discuss changes in blood pressures or heart health with provider; Provided education on prescribed diet heart healthy diet ;  Discussed complications of poorly controlled blood pressure such as heart disease, stroke, circulatory complications, vision complications, kidney impairment, sexual dysfunction;  Screening for signs and symptoms of depression related to chronic disease state;  Assessed social determinant of health barriers;   Symptom Management: Take medications as prescribed   Attend all scheduled provider appointments Call provider office for new concerns or questions  call the Suicide and Crisis Lifeline: 988 call the [place] National Suicide  Prevention Lifeline: (860)052-2298 or TTY: 5870349990 TTY 319-479-0214) to talk to a trained counselor call 1-800-273-TALK (toll free, 24 hour hotline) if experiencing a [place] or [place]  check blood pressure 3 times per week learn about high blood pressure take blood pressure log to all doctor appointments call doctor for signs and symptoms of high blood  pressure develop an action plan for high blood pressure keep all doctor appointments take medications for blood pressure exactly as prescribed report new symptoms to your doctor eat more whole grains, fruits and vegetables, lean meats and healthy fats  Follow Up Plan: Telephone follow up appointment with care management team member scheduled for: 11-04-2022 at 0900 am          Plan:Telephone follow up appointment with care management team member scheduled for:  11-04-2022 at 9 am  [place], MSN, CCM RN Care Manager  Chronic Care Management Direct Number: [phone]

## 2022-09-09 NOTE — Patient Instructions (Signed)
Please call the care guide team at [phone] if you need to cancel or reschedule your appointment.   If you are experiencing a Mental Health or [place] or need someone to talk to, please call the Suicide and Crisis Lifeline: 988 call the [place] National Suicide Prevention Lifeline: 204-340-8123 or TTY: 586-886-1604 TTY 256-039-9087) to talk to a trained counselor call 1-800-273-TALK (toll free, 24 hour hotline)   Following is a copy of the CCM Program Consent:  CCM service includes personalized support from designated clinical staff supervised by the physician, including individualized plan of care and coordination with other care providers 24/7 contact phone numbers for assistance for urgent and routine care needs. Service will only be billed when office clinical staff spend 20 minutes or more in a month to coordinate care. Only one practitioner may furnish and bill the service in a calendar month. The patient may stop CCM services at amy time (effective at the end of the month) by phone call to the office staff. The patient will be responsible for cost sharing (co-pay) or up to 20% of the service fee (after annual deductible is met)  Following is a copy of your full provider care plan:   Goals Addressed             This Visit's Progress    CCM Expected Outcome:  Monitor, Self-Manage and Reduce Symptoms of Heart Failure       Current Barriers:  Knowledge Deficits related to the importance of daily weights in the effective management of HF Care Coordination needs related to [facility] pharmacy and resources needed for effective medication management  in a patient with HF Chronic Disease Management support and education needs related to effective management of HF Wt Readings from Last 3 Encounters:  07/30/22 141 lb 3.2 oz (64 kg)  07/17/22 138 lb (62.6 kg)  05/14/22 142 lb (64.4 kg)     Planned Interventions: Basic overview and discussion of pathophysiology of Heart  Failure reviewed Provided education on low sodium diet. The patient states compliance with dietary restrictions Reviewed Heart Failure Action Plan in depth  Assessed need for readable accurate scales in home. Has scales in her home Provided education about placing scale on hard, flat surface Advised patient to weigh each morning after emptying bladder Discussed importance of daily weight and advised patient to weigh and record daily. The patient states she does not weigh daily. The patient states that she weighs a couple of times a week. Education on the benefits of daily weights and monitoring for changes in her swelling/edema and monitoring for heart failure exacerbations. Ask the patient to call the office for +2/3 pounds in one day or +5 pounds in one week. Will continue to monitor Review of sx and sx of heart failure and ask the patient to monitor for acute changes or onset:  F- Fatigue  A- Activity intolerance  C- Chest congestion or cough  E- Edema or Swelling in feet, legs, abdomen  S- Shortness of breath Reviewed role of diuretics in prevention of fluid overload and management of heart failure Discussed the importance of keeping all appointments with provider Provided patient with education about the role of exercise in the management of heart failure Advised patient to discuss changes in HF, acute onset of swelling or edema noted, and other sx and sx of HF with provider Screening for signs and symptoms of depression related to chronic disease state  Assessed social determinant of health barriers EF% 60 to 65% 09-15-2021  Symptom Management: Take medications as prescribed   Attend all scheduled provider appointments Call provider office for new concerns or questions  call the Suicide and Crisis Lifeline: 988 call the [place] National Suicide Prevention Lifeline: 662 551 8629 or TTY: 815-770-6391 TTY 8204733892) to talk to a trained counselor call 1-800-273-TALK (toll free, 24  hour hotline) if experiencing a Mental Health or [place]  call office if I gain more than 2 pounds in one day or 5 pounds in one week use salt in moderation watch for swelling in feet, ankles and legs every day weigh myself daily develop a rescue plan follow rescue plan if symptoms flare-up eat more whole grains, fruits and vegetables, lean meats and healthy fats track symptoms and what helps feel better or worse dress right for the weather, hot or cold  Follow Up Plan: Telephone follow up appointment with care management team member scheduled for: 11-04-2022 at 0900 am       CCM Expected Outcome:  Monitor, Self-Manage, and Reduce Symptoms of Hypertension       Current Barriers:  Knowledge Deficits related to consistent measurement of blood pressures and to report blood pressures that are consistently out of range Chronic Disease Management support and education needs related to effective management of HTN BP Readings from Last 3 Encounters:  07/17/22 (!) 150/74  05/14/22 110/80  04/21/22 (!) 158/64     Planned Interventions: Evaluation of current treatment plan related to hypertension self management and patient's adherence to plan as established by provider. The patient has a cardiologist that she sees on a regular basis. She does check her blood pressures at home and says they are usually good. Does experience headaches at times. Ask the patient to take her blood pressures when she is experiencing headaches. Education and support given;   Provided education to patient re: stroke prevention, s/s of heart attack and stroke; Reviewed prescribed diet heart healthy diet. Review and education provided. The patient watches her sodium content. Says that she has an appetite sometimes and sometimes she does not. She denies any issues with dietary restrictions. Reviewed medications with patient and discussed importance of compliance. The patient is compliant with medications and  has worked with pharm D in the past. Has upcoming appointment with the pharm D on 09-16-2022;  Counseled on the importance of exercise goals with target of 150 minutes per week Discussed plans with patient for ongoing care management follow up and provided patient with direct contact information for care management team; Advised patient, providing education and rationale, to monitor blood pressure daily and record, calling PCP for findings outside established parameters. The patient does take her blood pressures at home. No readings available at the time of the call. Discussed the goals of blood pressures: <628 systolic and <31 diastolic;  Reviewed scheduled/upcoming provider appointments including: No upcoming with the pcp, does have testing coming up for leg and back pain, knows to call pcp for changes Advised patient to discuss changes in blood pressures or heart health with provider; Provided education on prescribed diet heart healthy diet ;  Discussed complications of poorly controlled blood pressure such as heart disease, stroke, circulatory complications, vision complications, kidney impairment, sexual dysfunction;  Screening for signs and symptoms of depression related to chronic disease state;  Assessed social determinant of health barriers;   Symptom Management: Take medications as prescribed   Attend all scheduled provider appointments Call provider office for new concerns or questions  call the Suicide and Crisis Lifeline: 988 call the  [place] National Suicide Prevention Lifeline: (954)019-6309 or TTY: (831) 470-2224 TTY 310-169-3480) to talk to a trained counselor call 1-800-273-TALK (toll free, 24 hour hotline) if experiencing a Mental Health or [place]  check blood pressure 3 times per week learn about high blood pressure take blood pressure log to all doctor appointments call doctor for signs and symptoms of high blood pressure develop an action plan for high blood  pressure keep all doctor appointments take medications for blood pressure exactly as prescribed report new symptoms to your doctor eat more whole grains, fruits and vegetables, lean meats and healthy fats  Follow Up Plan: Telephone follow up appointment with care management team member scheduled for: 11-04-2022 at 0900 am          Patient verbalizes understanding of instructions and care plan provided today and agrees to view in [place]. Active MyChart status and patient understanding of how to access instructions and care plan via MyChart confirmed with patient.     Telephone follow up appointment with care management team member scheduled for: 11-04-2022 at 0900 am

## 2022-09-11 ENCOUNTER — Ambulatory Visit: Payer: Medicare HMO | Admitting: Speech Pathology

## 2022-09-15 ENCOUNTER — Other Ambulatory Visit: Payer: Self-pay | Admitting: Family Medicine

## 2022-09-15 NOTE — Telephone Encounter (Signed)
Refill request for GABAPENTIN 400 MG CAP   LOV - 07/17/22 Next OV - not scheduled Last refill - 08/29/21 #90/5

## 2022-09-16 ENCOUNTER — Telehealth: Payer: Self-pay | Admitting: Anesthesiology

## 2022-09-16 ENCOUNTER — Other Ambulatory Visit: Payer: Medicare HMO | Admitting: Pharmacist

## 2022-09-16 NOTE — Progress Notes (Signed)
09/16/2022 Name: Catherine Green MRN: [id number] DOB: 13-Oct-1940  Chief Complaint  Patient presents with   Medication Management    Catherine Green is a 82 y.o. year old female who presented for a telephone visit.   They were referred to the pharmacist by their PCP for assistance in managing hypertension and heart failure .    Subjective:  Perform chart review - Patient seen for Office Visit with Dr. Silvio Pate on 11/17 related to muscle weakness. Provider advised patient:  Stop the crestor (rosuvastatin) for the next month to see if that helps your muscle strength at all  Referral placed to Neurology for evaluation - Office Visit with [facility] [place] on 11/30. Provider advised patient of plan for:  Blood work: HMG CoA reductase ab, vit E, copper, B1, IFE, ANA, ENA EMG: RLE and RUE (myopathy and radiculopathy) Will consider MRI cervical and thoracic spine if other work up is unrevealing Continue gabapentin Physical therapy ([place]) Return to clinic in 3 months  - From review of chart, note patient has EMG scheduled for 09/28/2022   Care Team: Primary Care Provider: Tonia Ghent, MD  Cardiologist: Ida Rogue, MD  Neurologist: Shellia Carwin, MD; Next Scheduled Visit: 09/28/2022  Medication Access/Adherence  Current Pharmacy:  [place], [place] - [address] [place] [address] Phone: [phone] Fax: [phone]  [id number] [id number]: [phone]: [phone]  Patient reports affordability concerns with their medications: No  Patient reports access/transportation concerns to their pharmacy: No  Patient reports adherence concerns with their medications:  No     HFpEF /Hypertension:  [facility] Current medications:  Beta blocker: metoprolol ER 25 mg - 1/2 tablet (12.5 mg)  QHS Diuretic regimen: furosemide 20 mg daily Potassium supplement: potassium chloride 10 meq daily CCB: amlodipine 2.5 mg - 1/2 tablet (1.25 mg) daily  Current home blood pressure readings: last checked: 1/16, reading: 138/72, HR 53  Patient denies hypotensive s/sx including dizziness, lightheadedness.   Patient denies any recent signs of swelling/edema  Confirms weighs herself daily as directed  Current physical activity: walks on treadmill ~10 minutes x a couple of days/week From review of chart, note patient referred by Neurology to physical therapy on 07/30/2022  Today patient denies having heard back regarding this referral   Hyperlipidemia/ASCVD Risk Reduction  [facility] Current lipid lowering medications: rosuvastatin 20 mg daily & ezetimibe 10 mg daily  Today patient reports she held rosuvastatin as recommended by Dr. LSilvio Pateon 11/17, but has since restarted taking it as did not notice a change in her muscle symptoms/weakness when off of rosuvastatin  Antiplatelet regimen: aspirin 81 mg  ASCVD History: status post three-vessel CABG in 2006, HFpEF, bilateral carotid artery disease status post bilateral CEA followed by vascular surgery, HTN  Risk Factors: prior tobacco use  Current physical activity: walks on treadmill ~10 minutes x a couple of days/week     Objective:  Lab Results  Component Value Date   CREATININE 0.93 07/17/2022   BUN 15 07/17/2022   NA 139 07/17/2022   K 4.8 07/17/2022   CL 99 07/17/2022   CO2 32 07/17/2022    Lab Results  Component Value Date   CHOL 148 07/17/2022   HDL 60.00 07/17/2022   LDLCALC 61 07/17/2022   LDLDIRECT 139.1 04/11/2012  TRIG 136.0 07/17/2022   CHOLHDL 2 07/17/2022   BP Readings from Last 3 Encounters:  07/17/22 (!) 150/74  05/14/22 110/80  04/21/22 (!) 158/64   Pulse Readings from Last 3 Encounters:  07/17/22 63  05/14/22 (!) 53  04/21/22 61     Medications  Reviewed Today     Reviewed by Rennis Petty, RPH-CPP (Pharmacist) on 09/16/22 at [place] List Status: <None>   Medication Order Taking? Sig Documenting Provider Last Dose Status Informant  albuterol (VENTOLIN HFA) 108 (90 Base) MCG/ACT inhaler [id number]  Inhale 2 puffs into the lungs every 6 (six) hours as needed for wheezing or shortness of breath. Rise Mu, PA-C  Active   amLODipine (NORVASC) 2.5 MG tablet [id number] Yes Take 0.5 tablets (1.25 mg total) by mouth daily. Tonia Ghent, MD Taking Active   aspirin 81 MG tablet [id number] Yes Take 1 tablet (81 mg total) by mouth daily. Minna Merritts, MD Taking Active   Coenzyme Q10 (CO Q10) 100 MG CAPS [id number] Yes Take 100 mg by mouth daily. prn [provider] Taking Active   ezetimibe (ZETIA) 10 MG tablet [id number] Yes TAKE 1 TABLET BY MOUTH DAILY Gollan, Kathlene November, MD Taking Active   furosemide (LASIX) 20 MG tablet [id number] Yes TAKE 1 TABLET BY MOUTH DAILY. Rise Mu, PA-C Taking Active   gabapentin (NEURONTIN) 400 MG capsule [id number] Yes TAKE 1 CAPSULE BY MOUTH EVERY 8 HOURS  Patient taking differently: Take 400 mg by mouth 3 (three) times daily as needed.   Tonia Ghent, MD Taking Active   ketoconazole (NIZORAL) 2 % shampoo [id number] Yes Apply topically as directed. [provider] Taking Active   loratadine (CLARITIN) 10 MG tablet [id number] Yes Take 10 mg by mouth daily as needed for allergies. [provider] Taking Active   metoprolol succinate (TOPROL XL) 25 MG 24 hr tablet [id number] Yes Take 0.5 tablets (12.5 mg total) by mouth at bedtime. Rise Mu, PA-C Taking Active   omeprazole (PRILOSEC) 20 MG capsule [id number] Yes Take 1 capsule (20 mg total) by mouth 2 (two) times daily before a meal. Tonia Ghent, MD Taking Active   potassium chloride (KLOR-CON) 10 MEQ tablet [id number] Yes TAKE 1 TABLET BY MOUTH DAILY Rockey Situ, Kathlene November, MD Taking Active   rosuvastatin (CRESTOR) 20 MG  tablet [id number] Yes TAKE ONE TABLET BY MOUTH DAILY Minna Merritts, MD Taking Active   sertraline (ZOLOFT) 100 MG tablet [id number] Yes TAKE ONE AND A HALF TABLETS BY MOUTH EVERY DAY Tonia Ghent, MD Taking Active   vitamin B-12 (CYANOCOBALAMIN) 1000 MCG tablet [id number] Yes Take 1 tablet (1,000 mcg total) by mouth daily. Tonia Ghent, MD Taking Active               Assessment/Plan:   Comprehensive medication review performed; medication list updated in electronic medical record  Encourage patent to be more consistent with use of her walker/using railings (including when on treadmill) for support  Patient to follow up regarding physical therapy referral from Neurology - Place call to [place] on behalf of patient today. Leave message with office requesting call back to patient regarding this referral  Hypertension: - Reviewed long term cardiovascular and renal outcomes of uncontrolled blood pressure - Reviewed appropriate blood pressure monitoring technique - Recommended to continue to check home blood pressure and heart rate, keep a log of the results and have this record to review at medical appointments - Reviewed to  continue to weigh daily and when to contact cardiology with weight gain   Hyperlipidemia/ASCVD Risk Reduction: - Currently controlled.     Follow Up Plan: Clinical Pharmacist to follow up with patient by telephone on 12/23/2022 at 9 am  Wallace Cullens, PharmD, [facility] [phone]

## 2022-09-16 NOTE — Telephone Encounter (Signed)
Catherine Green from medication management left message stating she saw pt today for a visit and pt informed her that she never heard back from the referrals Dr Berdine Addison sent for her. She was supposed to have an MRI as well a referral for PT. Pt requests call back.

## 2022-09-16 NOTE — Patient Instructions (Signed)
Goals Addressed             This Visit's Progress    Pharmacy Goal       Check your blood pressure once daily, and any time you have concerning symptoms like headache, chest pain, dizziness, shortness of breath, or vision changes.    To appropriately check your blood pressure, make sure you do the following:  1) Avoid caffeine, exercise, or tobacco products for 30 minutes before checking. Empty your bladder. 2) Sit with your back supported in a flat-backed chair. Rest your arm on something flat (arm of the chair, table, etc). 3) Sit still with your feet flat on the floor, resting, for at least 5 minutes.  4) Check your blood pressure. Take 1-2 readings.  5) Write down these readings and bring with you to any provider appointments.  Bring your home blood pressure machine with you to a provider's office for accuracy comparison at least once a year.   Make sure you take your blood pressure medications before you come to any office visit, even if you were asked to fast for labs.  Wallace Cullens, PharmD, [facility] [phone]

## 2022-09-16 NOTE — Telephone Encounter (Signed)
Called [facility] and I am going to resend the papers for referral of PT. Called Catherine Green and informed her of this and told her per Dr.Hill he will assess the MRI after the EMG and the 29th. She understood.

## 2022-09-28 ENCOUNTER — Telehealth: Payer: Self-pay | Admitting: Neurology

## 2022-09-28 ENCOUNTER — Ambulatory Visit: Payer: Medicare HMO | Admitting: Neurology

## 2022-09-28 DIAGNOSIS — M79604 Pain in right leg: Secondary | ICD-10-CM

## 2022-09-28 DIAGNOSIS — M79605 Pain in left leg: Secondary | ICD-10-CM | POA: Diagnosis not present

## 2022-09-28 DIAGNOSIS — M545 Low back pain, unspecified: Secondary | ICD-10-CM

## 2022-09-28 DIAGNOSIS — R2681 Unsteadiness on feet: Secondary | ICD-10-CM

## 2022-09-28 DIAGNOSIS — R29898 Other symptoms and signs involving the musculoskeletal system: Secondary | ICD-10-CM

## 2022-09-28 NOTE — Procedures (Signed)
[facility] Neurology  [place], [place]  [place], [place] [address] Tel: [phone] Fax: [phone] Test Date:  09/28/2022  Patient: Catherine Green DOB: 07-28-1941 Physician: Kai Levins, MD  Sex: Female Height: '5\' 2"'$  Ref Phys: Kai Levins, MD  ID#: [id number]   Technician:    History: This is an 82 year old female with leg weakness and pain.  NCV & EMG Findings: Extensive electrodiagnostic evaluation of the left upper and lower limbs show: Left sural, superficial peroneal/fibular, median, ulnar, and radial sensory responses are within normal limits. Left peroneal/fibular (EDB), tibial (AH), median (APB), and ulnar (ADM) motor responses are within normal limits. Left H reflex latency is within normal limits. There is no evidence of active or chronic motor axon loss changes affecting any of the tested muscles on needle examination. Motor unit configuration and recruitment pattern is within normal limits.  Impression: This is a normal electrodiagnostic evaluation. There is no electrodiagnostic evidence of a large fiber peripheral neuropathy, myopathy, left lumbosacral (L2-S1) motor radiculopathy, or left cervical (C5-C8) motor radiculopathy.    ___________________________ Kai Levins, MD    Nerve Conduction Studies Motor Nerve Results    Latency Amplitude F-Lat Segment Distance CV Comment  Site (ms) Norm (mV) Norm (ms)  (cm) (m/s) Norm   Left Fibular (EDB) Motor  Ankle 3.5  < 6.0 3.8  > 2.5        Bel fib head 10.1 - 3.8 -  Bel fib head-Ankle 30.5 46  > 40   Pop fossa 11.9 - 3.7 -  Pop fossa-Bel fib head 8 44 -   Left Median (APB) Motor  Wrist 2.7  < 4.0 10.3  > 5.0        Elbow 7.1 - 10.3 -  Elbow-Wrist 27 61  > 50   Left Tibial (AH) Motor  Ankle 3.7  < 6.0 13.8  > 4.0        Knee 12.2 - 11.0 -  Knee-Ankle 38 45  > 40   Left Ulnar (ADM) Motor  Wrist 1.90  < 3.1 9.7  > 7.0        Bel elbow 5.1 - 8.3 -  Bel elbow-Wrist 20 63  > 50   Ab elbow 7.0 - 8.2 -  Ab  elbow-Bel elbow 10 53 -    Sensory Sites    Neg Peak Lat Amplitude (O-P) Segment Distance Velocity Comment  Site (ms) Norm (V) Norm  (cm) (ms)   Left Median Sensory  Wrist-Dig II 3.2  < 3.8 40  > 10 Wrist-Dig II 13    Left Radial Sensory  Forearm-Wrist 2.4  < 2.8 23  > 10 Forearm-Wrist 10    Left Superficial Fibular Sensory  14 cm-Ankle 2.2  < 4.6 4  > 3 14 cm-Ankle 14    Left Sural Sensory  Calf-Lat mall 3.7  < 4.6 4  > 3 Calf-Lat mall 14    Left Ulnar Sensory  Wrist-Dig V 2.8  < 3.2 27  > 5 Wrist-Dig V 11     H-Reflex Results    M-Lat H Lat H Neg Amp H-M Lat  Site (ms) (ms) Norm (mV) (ms)  Left Tibial H-Reflex  Pop fossa 6.1 32.5  < 35.0 - 26.4   Electromyography   Side Muscle Ins.Act Fibs Fasc Recrt Amp Dur Poly Activation Comment  Left Tib ant Nml Nml Nml Nml Nml Nml Nml Nml N/A  Left Gastroc MH Nml Nml Nml Nml Nml Nml Nml 2-  N/A  Left Rectus fem Nml Nml Nml Nml Nml Nml Nml Nml N/A  Left Iliacus Nml Nml Nml Nml Nml Nml Nml Nml N/A  Left Gluteus med Nml Nml Nml Nml Nml Nml Nml Nml N/A  Left FDI Nml Nml Nml Nml Nml Nml Nml Nml N/A  Left Pronator teres Nml Nml Nml Nml Nml Nml Nml Nml N/A  Left Biceps Nml Nml Nml Nml Nml Nml Nml Nml N/A  Left Triceps lat hd Nml Nml Nml Nml Nml Nml Nml Nml N/A  Left Deltoid Nml Nml Nml Nml Nml Nml Nml Nml N/A      Waveforms:  Motor           Sensory             H-Reflex

## 2022-09-28 NOTE — Telephone Encounter (Signed)
Discussed the results of patient's EMG after the procedure today. EMG was essentially normal today. Patient begins PT tomorrow. She will follow up with me on 11/12/22.  All questions were answered.  Kai Levins, MD [facility]rology

## 2022-09-29 DIAGNOSIS — R296 Repeated falls: Secondary | ICD-10-CM | POA: Diagnosis not present

## 2022-09-29 DIAGNOSIS — M6281 Muscle weakness (generalized): Secondary | ICD-10-CM | POA: Diagnosis not present

## 2022-09-30 DIAGNOSIS — I110 Hypertensive heart disease with heart failure: Secondary | ICD-10-CM | POA: Diagnosis not present

## 2022-09-30 DIAGNOSIS — I5030 Unspecified diastolic (congestive) heart failure: Secondary | ICD-10-CM

## 2022-10-08 DIAGNOSIS — R296 Repeated falls: Secondary | ICD-10-CM | POA: Diagnosis not present

## 2022-10-08 DIAGNOSIS — M6281 Muscle weakness (generalized): Secondary | ICD-10-CM | POA: Diagnosis not present

## 2022-10-09 DIAGNOSIS — M6281 Muscle weakness (generalized): Secondary | ICD-10-CM | POA: Diagnosis not present

## 2022-10-09 DIAGNOSIS — R296 Repeated falls: Secondary | ICD-10-CM | POA: Diagnosis not present

## 2022-10-13 DIAGNOSIS — R296 Repeated falls: Secondary | ICD-10-CM | POA: Diagnosis not present

## 2022-10-13 DIAGNOSIS — M6281 Muscle weakness (generalized): Secondary | ICD-10-CM | POA: Diagnosis not present

## 2022-10-14 ENCOUNTER — Other Ambulatory Visit: Payer: Self-pay | Admitting: Physician Assistant

## 2022-10-15 DIAGNOSIS — M6281 Muscle weakness (generalized): Secondary | ICD-10-CM | POA: Diagnosis not present

## 2022-10-15 DIAGNOSIS — R296 Repeated falls: Secondary | ICD-10-CM | POA: Diagnosis not present

## 2022-10-20 DIAGNOSIS — R296 Repeated falls: Secondary | ICD-10-CM | POA: Diagnosis not present

## 2022-10-20 DIAGNOSIS — M6281 Muscle weakness (generalized): Secondary | ICD-10-CM | POA: Diagnosis not present

## 2022-10-26 ENCOUNTER — Encounter: Payer: Self-pay | Admitting: Family Medicine

## 2022-10-26 ENCOUNTER — Ambulatory Visit (INDEPENDENT_AMBULATORY_CARE_PROVIDER_SITE_OTHER): Payer: Medicare HMO | Admitting: Family Medicine

## 2022-10-26 VITALS — BP 134/70 | HR 79 | Temp 98.3°F | Ht 62.0 in | Wt 142.0 lb

## 2022-10-26 DIAGNOSIS — J0100 Acute maxillary sinusitis, unspecified: Secondary | ICD-10-CM

## 2022-10-26 MED ORDER — POLYETHYLENE GLYCOL 3350 17 GM/SCOOP PO POWD: 17.0000 g | Freq: Two times a day (BID) | ORAL | 0 refills | Status: DC | PRN

## 2022-10-26 MED ORDER — ALBUTEROL SULFATE HFA 108 (90 BASE) MCG/ACT IN AERS: 2.0000 | INHALATION_SPRAY | Freq: Four times a day (QID) | RESPIRATORY_TRACT | 3 refills | Status: DC | PRN

## 2022-10-26 MED ORDER — CEFDINIR 300 MG PO CAPS: 300.0000 mg | ORAL_CAPSULE | Freq: Two times a day (BID) | ORAL | 0 refills | Status: DC

## 2022-10-26 NOTE — Progress Notes (Unsigned)
Sx started about 7 days ago.  Sick contacts at home.  Congestion, nausea, clogged ears, wheezing since Wednesday. Patient has been taking otc cold and flu medication.  No sputum. Voice hoarse.  Runny nose.  No fevers known.  Hasn't used SABA today but has in the last few days.  She is minimally better over the last few days.    No h/o rash with PCN.  She had GI intolerance.   Recent constipation d/w pt. discussed options.  Can use MiraLAX.  Meds, vitals, and allergies reviewed.   ROS: Per HPI unless specifically indicated in ROS section   GEN: nad, alert and oriented HEENT: mucous membranes moist, tm w/o erythema, SOM noted. Nasal exam w/o erythema, clear discharge noted,  OP with cobblestoning NECK: supple w/o LA CV: rrr.   PULM: ctab, no inc wob EXT: no edema SKIN: Well-perfused. Right greater than left maxillary sinuses tender to palpation.

## 2022-10-26 NOTE — Patient Instructions (Signed)
Start Cefdinir.  Rest and fluids, use your inhaler.  Update [place] as needed.  Miralax as needed.  It may be cheaper OTC.  Take care.  Glad to see you.

## 2022-10-28 DIAGNOSIS — J0100 Acute maxillary sinusitis, unspecified: Secondary | ICD-10-CM | POA: Insufficient documentation

## 2022-10-28 HISTORY — DX: Acute maxillary sinusitis, unspecified: J01.00

## 2022-10-28 NOTE — Assessment & Plan Note (Signed)
Start Cefdinir.  Rest and fluids, use albuterol as needed.  Update [place] as needed.  Okay for outpatient follow-up.

## 2022-10-30 ENCOUNTER — Other Ambulatory Visit: Payer: Self-pay | Admitting: *Deleted

## 2022-10-30 DIAGNOSIS — I6523 Occlusion and stenosis of bilateral carotid arteries: Secondary | ICD-10-CM

## 2022-10-30 NOTE — Progress Notes (Signed)
I saw Catherine Green in neurology clinic on 11/12/22 in follow up for muscle weakness.  HPI: Catherine Green is a 82 y.o. year old female with a history of lumbar spine disease s/p surgery (~2018), HTN, HLD, CAD s/p CABG (2004), carotid stenosis s/p surgery on both sides multiple times, depression, PVD, edema (on lasix), previous smoker who we last saw on 07/30/22.  To briefly review: Patient has had weakness off and on for 2-3 years. The weakness is in her legs. It is especially bad when it is cold. She gets pain in her back, front of her legs, and down below knee. It is an electric pain. She can have leg soreness the day after bad electric pains. It has been getting worse. She states this has been going on for a while (not sure exactly when). She finds when she walks, things are worse. When she sits down, symptoms improve. When she is in a store walking, hanging on a cart helps her move better. She will use a walker in her condo.   Patient had one fall, on 07/07/22 when she fell at the dentist. She has almost fallen other times but grabs onto her husband.   She has a history of lumbar spine disease. She had surgery in ~2018 and has been told everything has been done that can be done. She previously got injections as well.   She takes gabapentin 400 mg every 8 hours for pain. This helps but causes some cognitive slowing for her. She takes B12 1000 mcg daily. She was previously on crestor 20 mg and zetia 10 mg daily for many years. She recently stopped the crestor but is still on the zetia due to concerns for weakness on 07/17/22. This has not changed her symptoms.    Patient denies diplopia, ptosis, problems chewing, changes to her voice. She occasionally gets choked when eating bread or something she has to chew a lot. She occasionally coughs when drinking water. She has had this looked at with endoscopy years ago and a polyp was removed. She does not think this helped.   There are no  neuromuscular respiratory weakness symptoms, particularly orthopnea>dyspnea.    Pseudobulbar affect is absent.   She endorses dry eyes and mouth. She denies bowel or bladder problems, early satiety, or postprandial bloating.   She has lost 30 pounds over the last year but stable over the last year. Patient feels this was due to taking lasix and loss of edema.   EtOH use: None  Restrictive diet? No Family history of neuropathy/myopathy/NM disease? No   Patient had an EMG in 2019, prior to symptoms in her legs (normal EMG).  Most recent Assessment and Plan (07/30/22): Her neurological examination is pertinent for pain in proximal muscle testing and ?weakness. She is hyperreflexic, especially in the lower extremities. Sensation was diminished in a patchy fashion without clear distribution for localization. Orthostatic vitals were negative for orthostatic hypotension. Available diagnostic data is significant for normal EMG in 2019, MRI lumbar spine from 10/2020 that showed no significant compression. Lab work including B12, HbA1c, and CK are unremarkable.    The etiology of patient's symptoms are currently unclear. Given her previous history of lumbar spine disease and symptoms that worsen with standing and improve with sitting, consistent with neurogenic claudication, the lumbar spine is one possible localization. Her use of statin with zetia can increase the chance of myalgias and inflammatory myopathy, and given her ?proximal weakness and pain, this could be another  etiology. I would expect her CK to be elevated though, and it is actually lower recently than 4 years ago. Given her hyperreflexia, a cervical or thoracic spine localization is also possible. I will start with blood work and EMG and may expand work up if unrevealing. Patient would benefit for PT for strengthening and help with balance.   PLAN: -Blood work: HMG CoA reductase ab, vit E, copper, B1, IFE, ANA, ENA -EMG: RLE and RUE  (myopathy and radiculopathy) -Will consider MRI cervical and thoracic spine if other work up is unrevealing -Continue gabapentin -Physical therapy ([facility] [facility])  Since their last visit: Labs were normal. EMG on 09/28/22 was normal with no evidence of neuropathy, myopathy, or left cervical or lumbosacral radiculopathy.  Patient's symptoms improved with therapy. She finished therapy recently and feels like she has returned to previously low. She has home exercises. She states she was keeping up with the exercises until she got sick about 3 weeks. She has a sinus infection and has felt dizzy and hearing worse than normal.  She has had no falls. She uses a walker at home.   MEDICATIONS:  Outpatient Encounter Medications as of 11/12/2022  Medication Sig   albuterol (VENTOLIN HFA) 108 (90 Base) MCG/ACT inhaler Inhale 2 puffs into the lungs every 6 (six) hours as needed for wheezing or shortness of breath.   amLODipine (NORVASC) 2.5 MG tablet Take 0.5 tablets (1.25 mg total) by mouth daily.   aspirin 81 MG tablet Take 1 tablet (81 mg total) by mouth daily.   cefdinir (OMNICEF) 300 MG capsule Take 1 capsule (300 mg total) by mouth 2 (two) times daily.   Coenzyme Q10 (CO Q10) 100 MG CAPS Take 100 mg by mouth daily. prn   ezetimibe (ZETIA) 10 MG tablet Take 1 tablet (10 mg total) by mouth daily. Additional refill available at office visit   furosemide (LASIX) 20 MG tablet TAKE 1 TABLET BY MOUTH DAILY.   gabapentin (NEURONTIN) 400 MG capsule Take 1 capsule (400 mg total) by mouth 3 (three) times daily as needed.   ketoconazole (NIZORAL) 2 % shampoo Apply topically as directed.   metoprolol succinate (TOPROL-XL) 25 MG 24 hr tablet TAKE 1/2 TABLET AT BEDTIME   omeprazole (PRILOSEC) 20 MG capsule Take 1 capsule (20 mg total) by mouth 2 (two) times daily before a meal.   polyethylene glycol powder (GLYCOLAX/MIRALAX) 17 GM/SCOOP powder Take 17 g by mouth 2 (two) times daily as needed.   potassium chloride  (KLOR-CON) 10 MEQ tablet TAKE 1 TABLET BY MOUTH DAILY   rosuvastatin (CRESTOR) 20 MG tablet TAKE ONE TABLET BY MOUTH DAILY   sertraline (ZOLOFT) 100 MG tablet TAKE ONE AND A HALF TABLETS BY MOUTH EVERY DAY   vitamin B-12 (CYANOCOBALAMIN) 1000 MCG tablet Take 1 tablet (1,000 mcg total) by mouth daily.   loratadine (CLARITIN) 10 MG tablet Take 10 mg by mouth daily as needed for allergies. (Patient not taking: Reported on 11/12/2022)   [DISCONTINUED] ezetimibe (ZETIA) 10 MG tablet TAKE 1 TABLET BY MOUTH DAILY   No facility-administered encounter medications on file as of 11/12/2022.    PAST MEDICAL HISTORY: Past Medical History:  Diagnosis Date   Allergy    hay fever   Anemia    Anxiety    Back pain    Carotid artery occlusion    Cerebrovascular disease    extracranial; occlusive   Chicken pox    Coronary artery disease    Depression    Dizziness  Dizziness    DVT (deep venous thrombosis) ([facility])    Fainting spell    GERD (gastroesophageal reflux disease)    Headache    Heart murmur    Hyperlipidemia    Hypertension    Leg pain    PONV (postoperative nausea and vomiting)    severe nausea and vomiting   Pre-syncope    PVD (peripheral vascular disease) ([place])    endarterectomy by [name]   Seasonal allergies    Shortness of breath dyspnea    wth ambulation at times   Swelling of both ankles    and abdomen; takes Lasix when needed   Thrombophlebitis    following childbirth   Ulcer     PAST SURGICAL HISTORY: Past Surgical History:  Procedure Laterality Date   APPENDECTOMY     BACK SURGERY  01/14/16   BREAST SURGERY     saline implants   CARDIAC CATHETERIZATION     CAROTID ENDARTERECTOMY  1992   CAROTID ENDARTERECTOMY Right February 13, 2005   Re-do Right CE   CATARACT EXTRACTION, BILATERAL  2013   CHOLECYSTECTOMY     CHOLECYSTECTOMY, LAPAROSCOPIC  04/02/14   Dr. Rochel Brome   CORONARY ARTERY BYPASS GRAFT  2006   x3 Dr. Prescott Gum   ENDARTERECTOMY Left 07/20/2014    Procedure: ENDARTERECTOMY CAROTID-LEFT;  Surgeon: Rosetta Posner, MD;  Location: [facility] OR;  Service: Vascular;  Laterality: Left;   EYE SURGERY Bilateral Feb. 2013   Cataract Left eye   SEPTOPLASTY     with bilateral inferior turbinate reductions   SPINE SURGERY  12/2015   TOTAL ABDOMINAL HYSTERECTOMY     UPPER GASTROINTESTINAL ENDOSCOPY     had polyps removed from esophagus   UPPER GI ENDOSCOPY      ALLERGIES: Allergies  Allergen Reactions   Lyrica [Pregabalin] Shortness Of Breath, Swelling and Other (See Comments)    chest tight   Hydrocodone Other (See Comments)    hallucinate   Ivp Dye [Iodinated Contrast Media] Hives and Itching        Lipitor [Atorvastatin] Nausea And Vomiting   Methylprednisolone Other (See Comments)    Severe weekness    Oxycodone Hcl Other (See Comments)    Hallucinations    Penicillin G Nausea And Vomiting   Shellfish Allergy Hives and Itching   Buspar [Buspirone]     Worsening mood   Clavulanic Acid    Prednisone    Codeine Nausea And Vomiting and Other (See Comments)   Cymbalta [Duloxetine Hcl] Nausea Only    GI upset at '30mg'$    Demerol Nausea And Vomiting   Doxycycline Nausea Only and Other (See Comments)    Weakness, sick to her stomach   Effexor [Venlafaxine] Nausea Only   Hydrochlorothiazide Other (See Comments)    Not an allergy but urinary frequency and sweating at '25mg'$    Hydroxyzine Other (See Comments)    Excessive sweating.    Metoprolol Other (See Comments)    Slowed body down, per pt    Protonix [Pantoprazole Sodium] Nausea Only   Trazodone And Nefazodone Other (See Comments)    Sedation     FAMILY HISTORY: Family History  Problem Relation Age of Onset   Aneurysm Mother        brain   Heart disease Mother        Aneyursm    Hyperlipidemia Mother    Hypertension Mother    Varicose Veins Mother    Bleeding Disorder Mother    Heart disease  Father    Cirrhosis Father    Heart attack Father    Hyperlipidemia Father     Hypertension Father    Cancer Sister        lung   Heart disease Sister        Aneurysm   Hyperlipidemia Sister    Hypertension Sister    Varicose Veins Sister    Bleeding Disorder Sister    Heart disease Brother        Before age 80   Aneurysm Brother    Deep vein thrombosis Brother    Birth defects Brother    Hyperlipidemia Brother    Hypertension Brother    Peripheral vascular disease Daughter    Hyperlipidemia Daughter    Hypertension Daughter    Hypertension Other    Breast cancer Neg Hx    Colon cancer Neg Hx     SOCIAL HISTORY: Social History   Tobacco Use   Smoking status: Former    Packs/day: 0.30    Years: 15.00    Additional pack years: 0.00    Total pack years: 4.50    Types: Cigarettes    Quit date: 08/31/1996    Years since quitting: 26.2    Passive exposure: Never   Smokeless tobacco: Never  Vaping Use   Vaping Use: Never used  Substance Use Topics   Alcohol use: No    Alcohol/week: 0.0 standard drinks of alcohol   Drug use: No   Social History   Social History Narrative   Lives with husband in a one level    Work - retired Emergency planning/management officer   Diet - healthy   Right handed    Caffeine- 1 cup per day.    Objective:  Vital Signs:  BP 130/60   Pulse (!) 54   Ht '5\' 2"'$  (1.575 m)   Wt 138 lb (62.6 kg)   SpO2 90%   BMI 25.24 kg/m   General: General appearance: Awake and alert. No distress. Cooperative with exam.  Skin: No obvious rash or jaundice. HEENT: Atraumatic. Anicteric. Lungs: Non-labored breathing on room air  Extremities: No edema. No obvious deformity.  Musculoskeletal: No obvious joint swelling.  Neurological: Mental Status: Alert. Speech fluent. No pseudobulbar affect Cranial Nerves: CNII: No RAPD. Visual fields intact. CNIII, IV, VI: PERRL. No nystagmus. EOMI. CN V: Facial sensation intact bilaterally to fine touch. CN VII: Facial muscles symmetric and strong. No ptosis at rest CN VIII: Hears finger rub well  bilaterally. CN IX: No hypophonia. CN X: Palate elevates symmetrically. CN XI: Full strength shoulder shrug bilaterally. CN XII: Tongue protrusion full and midline. No atrophy or fasciculations. No significant dysarthria Motor: Tone is mildly increased.  Individual muscle group testing (MRC grade out of 5):  Movement     Neck flexion 5    Neck extension 5     Right Left   Shoulder abduction 5 5   Elbow flexion 5 5   Elbow extension 5 5   Finger extension 5 5   Finger flexion 5 5    Hip flexion 5- 5- Give way due to pain  Hip extension 5 5   Hip adduction 5 5   Hip abduction 5 5   Knee extension 5 5   Knee flexion 5 5   Dorsiflexion 5 5   Plantarflexion 5 5     Reflexes:  Right Left  Bicep 2-3+ 2-3+  Tricep 2-3+ 2-3+  BrRad 2-3+ 2-3+  Knee 3+ 3+  Ankle  2+ 2+   Pathological Reflexes: Babinski: mute response bilaterally Cross adductors with patellar reflex bilaterally Sensation: Pinprick: Intact in all extremities Vibration: Intact in all extremities Coordination: Intact finger-to- nose-finger and heel-to-shin bilaterally. Romberg negative. Gait: Difficulty with, but able to rise from chair with arms crossed unassisted. Very unsteady, narrow based, ?spastic gait.   Lab and Test Review: New results: 07/30/22: HMGCR ab: < 2 Vit E wnl Copper wnl B1 wnl IFE: no M protein ANA/ENA wnl  EMG (09/28/22): NCV & EMG Findings: Extensive electrodiagnostic evaluation of the left upper and lower limbs show: Left sural, superficial peroneal/fibular, median, ulnar, and radial sensory responses are within normal limits. Left peroneal/fibular (EDB), tibial (AH), median (APB), and ulnar (ADM) motor responses are within normal limits. Left H reflex latency is within normal limits. There is no evidence of active or chronic motor axon loss changes affecting any of the tested muscles on needle examination. Motor unit configuration and recruitment pattern is within normal limits.    Impression: This is a normal electrodiagnostic evaluation. There is no electrodiagnostic evidence of a large fiber peripheral neuropathy, myopathy, left lumbosacral (L2-S1) motor radiculopathy, or left cervical (C5-C8) motor radiculopathy.  Previously reviewed results: 07/17/22 labs: CK: 134 (409 four years ago) B12: 874 TSH: 1.02 ESR: 25 CBC and CMP wnl   HbA1c (02/07/22): 5.8   MRI lumbar spine wo contrast (11/12/20): FINDINGS: Segmentation: Consistent with the previous exams, L5 is a transitional vertebra, largely sacralized.   Alignment:  Normal   Vertebrae:  No fracture or primary bone lesion.   Conus medullaris and cauda equina: Conus extends to the L1 level. Conus and cauda equina appear normal.   Paraspinal and other soft tissues: Negative. Insignificant small renal cysts.   Disc levels:   T12-L1 and L1-2: Normal.   L2-3: Mild bulging of the disc. Mild ligamentous prominence. Mild canal narrowing but no neural compression. No change since 2020.   L3-4: Mild bulging of the disc. Mild ligamentous prominence. Mild canal narrowing but no neural compression. No change since 2020.   L4-5: Previous partial in ectomy on the right. Moderate bulging of the disc. Mild facet hypertrophy. Mild narrowing of the lateral recesses but without visible neural compression. No change since 2020.   L5-S1: Transitional level.  Rudimentary normal disc.  No stenosis.   IMPRESSION: 1. Consistent with the previous exams, L5 is a transitional vertebra, largely sacralized. 2. No change since the study 05/25/2019. Mild chronic degenerative changes at L2-3, L3-4 and L4-5 with disc bulges and ligamentous prominence but no apparent neural compression. Findings could contribute to back pain. Previous right laminotomy L4-5.   MRI brain w/wo contrast (08/03/18): FINDINGS: Brain: IAC protocol was performed including thin section imaging through the posterior fossa before and after  intravenous contrast. Seventh and 8th cranial nerves are normal. Negative for vestibular schwannoma. Basilar cisterns normal. Brainstem and cerebellum normal. Mastoid sinus is clear bilaterally. No enhancing mass in the posterior fossa or temporal bone.   Ventricle size and cerebral volume normal. Negative for acute infarct. Scattered white matter hyperintensities bilaterally compatible with chronic microvascular ischemia.   Vascular: Normal arterial flow voids. Normal venous enhancement without thrombosis.   Skull and upper cervical spine: Negative   Sinuses/Orbits: Paranasal sinuses clear. Mastoid sinus clear. Bilateral cataract surgery.   Other: None   IMPRESSION: No cause for hearing loss. Negative for vestibular schwannoma or other mass in the posterior fossa.   Mild chronic microvascular ischemic change in the cerebral white matter bilaterally.  EMG (05/26/18 - [facility]): FINDINGS: NERVE CONDUCTION STUDY: Bilateral tibial and peroneal motor responses are normal. Bilateral sural and superficial peroneal sensory responses are normal. Bilateral tibial F wave latencies are normal. NEEDLE ELECTROMYOGRAPHY: Needle examination of left upper extremity and left lower extremity is unremarkable. IMPRESSION:  This is a normal study. No electrodiagnostic evidence of large fiber neuropathy or myopathy at this time.  ASSESSMENT: This is Catherine Green, a 82 y.o. female with neck, back, and leg pain. Previous lab work evaluating for myopathy and neuropathy was normal. EMG was also normal. Her examination today is significant for improved strength, but hyperreflexia and continued gait abnormality that appears more spastic. I am concerned for possible cervical stenosis or mimic. Patient's symptoms improved with PT, but recently regressed after getting sick (sinus infection per patient).  Plan: -Blood work: vit E, copper -MRI cervical spine wo contrast -Continue home exercises given  PT -Fall precautions discussed and given to patient  Return to clinic in 3 months  Total time spent reviewing records, interview, history/exam, documentation, and coordination of care on day of encounter:  30 min  Kai Levins, MD

## 2022-10-30 NOTE — Progress Notes (Signed)
[place] order in place

## 2022-11-02 NOTE — Progress Notes (Unsigned)
HISTORY AND PHYSICAL     CC:  follow up. Requesting Provider:  Tonia Ghent, MD  HPI: This is a 82 y.o. female here for follow up for carotid artery stenosis.  Pt is s/p right CEA 1992 and redo CEA in 2016 by [name] for right carotid false aneurysm.  She also underwent  left CEA on 07/20/2014 also by [name] for asymptomatic carotid artery stenosis.   Pt was last seen 1/10/202 and at that time she was doing well without neurological symptoms.  She was compliant with her asa/statin.  Pt returns today for follow up.    Pt *** any amaurosis fugax, speech difficulties, weakness, numbness, paralysis or clumsiness or facial droop.    ***  The pt is on a statin for cholesterol management.  The pt is on a daily aspirin.   Other AC:  none The pt is on CCB, BB, diuretic for hypertension.   The pt does not have diabetes Tobacco hx:  former  Pt does not have family hx of AAA.  Past Medical History:  Diagnosis Date   Allergy    hay fever   Anemia    Anxiety    Back pain    Carotid artery occlusion    Cerebrovascular disease    extracranial; occlusive   Chicken pox    Coronary artery disease    Depression    Dizziness    Dizziness    DVT (deep venous thrombosis) (HCC)    Fainting spell    GERD (gastroesophageal reflux disease)    Headache    Heart murmur    Hyperlipidemia    Hypertension    Leg pain    PONV (postoperative nausea and vomiting)    severe nausea and vomiting   Pre-syncope    PVD (peripheral vascular disease) ([facility])    endarterectomy by [name]   Seasonal allergies    Shortness of breath dyspnea    wth ambulation at times   Swelling of both ankles    and abdomen; takes Lasix when needed   Thrombophlebitis    following childbirth   Ulcer     Past Surgical History:  Procedure Laterality Date   APPENDECTOMY     BACK SURGERY  01/14/16   BREAST SURGERY     saline implants   CARDIAC CATHETERIZATION     CAROTID ENDARTERECTOMY  1992   CAROTID  ENDARTERECTOMY Right February 13, 2005   Re-do Right CE   CATARACT EXTRACTION, BILATERAL  2013   CHOLECYSTECTOMY     CHOLECYSTECTOMY, LAPAROSCOPIC  04/02/14   Dr. Rochel Brome   CORONARY ARTERY BYPASS GRAFT  2006   x3 Dr. Prescott Gum   ENDARTERECTOMY Left 07/20/2014   Procedure: ENDARTERECTOMY CAROTID-LEFT;  Surgeon: Rosetta Posner, MD;  Location: [facility];  Service: Vascular;  Laterality: Left;   EYE SURGERY Bilateral Feb. 2013   Cataract Left eye   SEPTOPLASTY     with bilateral inferior turbinate reductions   SPINE SURGERY  12/2015   TOTAL ABDOMINAL HYSTERECTOMY     UPPER GASTROINTESTINAL ENDOSCOPY     had polyps removed from esophagus   UPPER GI ENDOSCOPY      Allergies  Allergen Reactions   Lyrica [Pregabalin] Shortness Of Breath, Swelling and Other (See Comments)    chest tight   Hydrocodone Other (See Comments)    hallucinate   Ivp Dye [Iodinated Contrast Media] Hives and Itching        Lipitor [Atorvastatin] Nausea And Vomiting  Methylprednisolone Other (See Comments)    Severe weekness    Oxycodone Hcl Other (See Comments)    Hallucinations    Penicillin G Nausea And Vomiting   Shellfish Allergy Hives and Itching   Buspar [Buspirone]     Worsening mood   Clavulanic Acid    Prednisone    Codeine Nausea And Vomiting and Other (See Comments)   Cymbalta [Duloxetine Hcl] Nausea Only    GI upset at '30mg'$    Demerol Nausea And Vomiting   Doxycycline Nausea Only and Other (See Comments)    Weakness, sick to her stomach   Effexor [Venlafaxine] Nausea Only   Hydrochlorothiazide Other (See Comments)    Not an allergy but urinary frequency and sweating at '25mg'$    Hydroxyzine Other (See Comments)    Excessive sweating.    Metoprolol Other (See Comments)    Slowed body down, per pt    Protonix [Pantoprazole Sodium] Nausea Only   Trazodone And Nefazodone Other (See Comments)    Sedation     Current Outpatient Medications  Medication Sig Dispense Refill   albuterol (VENTOLIN  HFA) 108 (90 Base) MCG/ACT inhaler Inhale 2 puffs into the lungs every 6 (six) hours as needed for wheezing or shortness of breath. 8 g 3   amLODipine (NORVASC) 2.5 MG tablet Take 0.5 tablets (1.25 mg total) by mouth daily.     aspirin 81 MG tablet Take 1 tablet (81 mg total) by mouth daily. 30 tablet    cefdinir (OMNICEF) 300 MG capsule Take 1 capsule (300 mg total) by mouth 2 (two) times daily. 14 capsule 0   Coenzyme Q10 (CO Q10) 100 MG CAPS Take 100 mg by mouth daily. prn     ezetimibe (ZETIA) 10 MG tablet TAKE 1 TABLET BY MOUTH DAILY 90 tablet 0   furosemide (LASIX) 20 MG tablet TAKE 1 TABLET BY MOUTH DAILY. 90 tablet 3   gabapentin (NEURONTIN) 400 MG capsule Take 1 capsule (400 mg total) by mouth 3 (three) times daily as needed. 90 capsule 5   ketoconazole (NIZORAL) 2 % shampoo Apply topically as directed.     loratadine (CLARITIN) 10 MG tablet Take 10 mg by mouth daily as needed for allergies.     metoprolol succinate (TOPROL-XL) 25 MG 24 hr tablet TAKE 1/2 TABLET AT BEDTIME 45 tablet 3   omeprazole (PRILOSEC) 20 MG capsule Take 1 capsule (20 mg total) by mouth 2 (two) times daily before a meal.     polyethylene glycol powder (GLYCOLAX/MIRALAX) 17 GM/SCOOP powder Take 17 g by mouth 2 (two) times daily as needed. 850 g 0   potassium chloride (KLOR-CON) 10 MEQ tablet TAKE 1 TABLET BY MOUTH DAILY 90 tablet 1   rosuvastatin (CRESTOR) 20 MG tablet TAKE ONE TABLET BY MOUTH DAILY 90 tablet 1   sertraline (ZOLOFT) 100 MG tablet TAKE ONE AND A HALF TABLETS BY MOUTH EVERY DAY 150 tablet 2   vitamin B-12 (CYANOCOBALAMIN) 1000 MCG tablet Take 1 tablet (1,000 mcg total) by mouth daily.     No current facility-administered medications for this visit.    Family History  Problem Relation Age of Onset   Aneurysm Mother        brain   Heart disease Mother        Aneyursm    Hyperlipidemia Mother    Hypertension Mother    Varicose Veins Mother    Bleeding Disorder Mother    Heart disease Father     Cirrhosis Father  Heart attack Father    Hyperlipidemia Father    Hypertension Father    Cancer Sister        lung   Heart disease Sister        Aneurysm   Hyperlipidemia Sister    Hypertension Sister    Varicose Veins Sister    Bleeding Disorder Sister    Heart disease Brother        Before age 23   Aneurysm Brother    Deep vein thrombosis Brother    Birth defects Brother    Hyperlipidemia Brother    Hypertension Brother    Peripheral vascular disease Daughter    Hyperlipidemia Daughter    Hypertension Daughter    Hypertension Other    Breast cancer Neg Hx    Colon cancer Neg Hx     Social History   Socioeconomic History   Marital status: Married    Spouse name: Jeneen Rinks    Number of children: 1   Years of education: Not on file   Highest education level: 11th grade  Occupational History    Comment: Retired   Tobacco Use   Smoking status: Former    Packs/day: 0.30    Years: 15.00    Total pack years: 4.50    Types: Cigarettes    Quit date: 08/31/1996    Years since quitting: 26.1   Smokeless tobacco: Never  Vaping Use   Vaping Use: Never used  Substance and Sexual Activity   Alcohol use: No    Alcohol/week: 0.0 standard drinks of alcohol   Drug use: No   Sexual activity: Not Currently  Other Topics Concern   Not on file  Social History Narrative   Lives with husband in a one level    Work - retired Emergency planning/management officer   Diet - healthy   Right handed    Caffeine- 1 cup per day.   Social Determinants of Health   Financial Resource Strain: Low Risk  (09/09/2022)   Overall Financial Resource Strain (CARDIA)    Difficulty of Paying Living Expenses: Not hard at all  Food Insecurity: No Food Insecurity (09/09/2022)   Hunger Vital Sign    Worried About Running Out of Food in the Last Year: Never true    Ran Out of Food in the Last Year: Never true  Transportation Needs: No Transportation Needs (09/09/2022)   PRAPARE - Hydrologist  (Medical): No    Lack of Transportation (Non-Medical): No  Physical Activity: Inactive (09/09/2022)   Exercise Vital Sign    Days of Exercise per Week: 0 days    Minutes of Exercise per Session: 0 min  Stress: No Stress Concern Present (09/09/2022)   [place]    Feeling of Stress : Not at all  Social Connections: Moderately Integrated (09/09/2022)   Social Connection and Isolation Panel [NHANES]    Frequency of Communication with Friends and Family: More than three times a week    Frequency of Social Gatherings with Friends and Family: More than three times a week    Attends Religious Services: More than 4 times per year    Active Member of [employer] or Organizations: No    Attends Archivist Meetings: Never    Marital Status: Married  Human resources officer Violence: Not At Risk (09/09/2022)   Humiliation, Afraid, Rape, and Kick questionnaire    Fear of Current or Ex-Partner: No    Emotionally Abused:  No    Physically Abused: No    Sexually Abused: No     REVIEW OF SYSTEMS:  *** '[X]'$  denotes positive finding, '[ ]'$  denotes negative finding Cardiac  Comments:  Chest pain or chest pressure:    Shortness of breath upon exertion:    Short of breath when lying flat:    Irregular heart rhythm:        Vascular    Pain in calf, thigh, or hip brought on by ambulation:    Pain in feet at night that wakes you up from your sleep:     Blood clot in your veins:    Leg swelling:         Pulmonary    Oxygen at home:    Productive cough:     Wheezing:         Neurologic    Sudden weakness in arms or legs:     Sudden numbness in arms or legs:     Sudden onset of difficulty speaking or slurred speech:    Temporary loss of vision in one eye:     Problems with dizziness:         Gastrointestinal    Blood in stool:     Vomited blood:         Genitourinary    Burning when urinating:     Blood in urine:         Psychiatric    Major depression:         Hematologic    Bleeding problems:    Problems with blood clotting too easily:        Skin    Rashes or ulcers:        Constitutional    Fever or chills:      PHYSICAL EXAMINATION:  ***  General:  WDWN in NAD; vital signs documented above Gait: Not observed HENT: WNL, normocephalic Pulmonary: normal non-labored breathing Cardiac: {Desc; regular/irreg:14544} HR, {With/Without:20273} carotid bruit*** Abdomen: soft, NT; aortic pulse is *** palpable Skin: {With/Without:20273} rashes Vascular Exam/Pulses:  Right Left  Radial {Exam; arterial pulse strength 0-4:30167} {Exam; arterial pulse strength 0-4:30167}  Popliteal {Exam; arterial pulse strength 0-4:30167} {Exam; arterial pulse strength 0-4:30167}  DP {Exam; arterial pulse strength 0-4:30167} {Exam; arterial pulse strength 0-4:30167}  PT {Exam; arterial pulse strength 0-4:30167} {Exam; arterial pulse strength 0-4:30167}   Extremities: {With/Without:20273} open wounds Musculoskeletal: no muscle wasting or atrophy  Neurologic: A&O X 3; moving all extremities equally; speech is fluent/normal Psychiatric:  The pt has {Desc; normal/abnormal:11317::"Normal"} affect.   Non-Invasive Vascular Imaging:   Carotid Duplex on 11/03/2022 Right:  ***% ICA stenosis Left:  ***% ICA stenosis ***  Previous Carotid duplex on 09/09/2021: Right: 1-39% ICA stenosis Left:   1-39% ICA stenosis    ASSESSMENT/PLAN:: 82 y.o. female here for follow up carotid artery stenosis and is s/p right CEA 1992 and redo CEA in 2016 by [name] for right carotid false aneurysm.  She also underwent  left CEA on 07/20/2014 also by [name] for asymptomatic carotid artery stenosis.   -duplex today reveals *** -discussed s/s of stroke with pt and she understands should she develop any of these sx, she will go to the nearest ER or call 911. -pt will f/u in *** with carotid duplex -pt will call sooner should she have any  issues. -continue statin/asa   Leontine Locket, [facility] [phone]  Clinic MD:  Carlis Abbott

## 2022-11-03 ENCOUNTER — Ambulatory Visit (HOSPITAL_COMMUNITY)
Admission: RE | Admit: 2022-11-03 | Discharge: 2022-11-03 | Disposition: A | Discharge status: Home / Self Care | Payer: Medicare HMO | Source: Ambulatory Visit | Attending: Surgery | Admitting: Surgery

## 2022-11-03 ENCOUNTER — Encounter: Payer: Self-pay | Admitting: Physician Assistant

## 2022-11-03 ENCOUNTER — Ambulatory Visit: Payer: Medicare HMO | Admitting: Physician Assistant

## 2022-11-03 VITALS — BP 137/64 | HR 59 | Temp 97.9°F | Resp 20 | Ht 62.0 in | Wt 137.3 lb

## 2022-11-03 DIAGNOSIS — I6523 Occlusion and stenosis of bilateral carotid arteries: Secondary | ICD-10-CM | POA: Insufficient documentation

## 2022-11-04 ENCOUNTER — Ambulatory Visit (INDEPENDENT_AMBULATORY_CARE_PROVIDER_SITE_OTHER): Payer: Medicare HMO

## 2022-11-04 ENCOUNTER — Telehealth: Payer: Medicare HMO

## 2022-11-04 DIAGNOSIS — I10 Essential (primary) hypertension: Secondary | ICD-10-CM

## 2022-11-04 DIAGNOSIS — I5033 Acute on chronic diastolic (congestive) heart failure: Secondary | ICD-10-CM

## 2022-11-04 NOTE — Chronic Care Management (AMB) (Signed)
Chronic Care Management   CCM RN Visit Note  11/04/2022 Name: Catherine Green MRN: [id number] DOB: [birth year]  Subjective: Catherine Green is a 82 y.o. year old female who is a primary care patient of Tonia Ghent, MD. The patient was referred to the Chronic Care Management team for assistance with care management needs subsequent to provider initiation of CCM services and plan of care.    Today's Visit:  Engaged with patient by telephone for follow up visit.        Goals Addressed             This Visit's Progress    CCM Expected Outcome:  Monitor, Self-Manage and Reduce Symptoms of Heart Failure       Current Barriers:  Knowledge Deficits related to the importance of daily weights in the effective management of HF Care Coordination needs related to poly pharmacy and resources needed for effective medication management  in a patient with HF Chronic Disease Management support and education needs related to effective management of HF Wt Readings from Last 3 Encounters:  11/03/22 137 lb 4.8 oz (62.3 kg)  10/26/22 142 lb (64.4 kg)  07/30/22 141 lb 3.2 oz (64 kg)     Planned Interventions: Basic overview and discussion of pathophysiology of Heart Failure reviewed Provided education on low sodium diet. The patient states compliance with dietary restrictions Reviewed Heart Failure Action Plan in depth. The patient monitors her weight and is around 137 now. She says that she is not having any exacerbations in  her heart failure at this time. Saw vascular doctor yesterday and got a good report. No changes in the plan of care.   Assessed need for readable accurate scales in home. Has scales in her home Provided education about placing scale on hard, flat surface Advised patient to weigh each morning after emptying bladder Discussed importance of daily weight and advised patient to weigh and record daily. The patient states she does not weigh daily. The patient states that she weighs a  couple of times a week. Education on the benefits of daily weights and monitoring for changes in her swelling/edema and monitoring for heart failure exacerbations. Ask the patient to call the office for +2/3 pounds in one day or +5 pounds in one week. Will continue to monitor Review of sx and sx of heart failure and ask the patient to monitor for acute changes or onset:  F- Fatigue  A- Activity intolerance  C- Chest congestion or cough  E- Edema or Swelling in feet, legs, abdomen  S- Shortness of breath Reviewed role of diuretics in prevention of fluid overload and management of heart failure Discussed the importance of keeping all appointments with provider Provided patient with education about the role of exercise in the management of heart failure Advised patient to discuss changes in HF, acute onset of swelling or edema noted, and other sx and sx of HF with provider Screening for signs and symptoms of depression related to chronic disease state  Assessed social determinant of health barriers EF% 60 to 65% 09-15-2021  Symptom Management: Take medications as prescribed   Attend all scheduled provider appointments Call provider office for new concerns or questions  call the Suicide and Crisis Lifeline: 988 call the [place] National Suicide Prevention Lifeline: (442) 776-2753 or TTY: 289-886-3180 TTY (807) 054-5452) to talk to a trained counselor call 1-800-273-TALK (toll free, 24 hour hotline) if experiencing a Mental Health or [place]  call office if I gain more  than 2 pounds in one day or 5 pounds in one week use salt in moderation watch for swelling in feet, ankles and legs every day weigh myself daily develop a rescue plan follow rescue plan if symptoms flare-up eat more whole grains, fruits and vegetables, lean meats and healthy fats track symptoms and what helps feel better or worse dress right for the weather, hot or cold  Follow Up Plan: Telephone follow up  appointment with care management team member scheduled for: 12-30-2022 at 0900 am       CCM Expected Outcome:  Monitor, Self-Manage, and Reduce Symptoms of Hypertension       Current Barriers:  Knowledge Deficits related to consistent measurement of blood pressures and to report blood pressures that are consistently out of range Chronic Disease Management support and education needs related to effective management of HTN BP Readings from Last 3 Encounters:  11/03/22 137/64  10/26/22 134/70  07/17/22 (!) 150/74     Planned Interventions: Evaluation of current treatment plan related to hypertension self management and patient's adherence to plan as established by provider. The patient has a cardiologist that she sees on a regular basis. States that her blood pressures are stable. She does have a cold and has been to the pcp for this. She is feeling better but it is lingering. She is going to give it a couple more days and then decide if she needs to call the provider back for a follow up. She has a neti pot. Encouraged her to use the neti pot to see if that would help. Review of what worse looks like. Education and support given.    Provided education to patient re: stroke prevention, s/s of heart attack and stroke; Reviewed prescribed diet heart healthy diet. Review and education provided. The patient watches her sodium content. Says that she has an appetite sometimes and sometimes she does not. She denies any issues with dietary restrictions. Reviewed medications with patient and discussed importance of compliance. The patient is compliant with medications and has worked with pharm D in the past. Ongoing support and medication management with the pharm D. Counseled on the importance of exercise goals with target of 150 minutes per week Discussed plans with patient for ongoing care management follow up and provided patient with direct contact information for care management team; Advised patient,  providing education and rationale, to monitor blood pressure daily and record, calling PCP for findings outside established parameters. The patient does take her blood pressures at home. No readings available at the time of the call. Discussed the goals of blood pressures: [id number] systolic and 0000000 diastolic;  Reviewed scheduled/upcoming provider appointments including: Saw the pcp on 10-26-2022 for a sinus infection. Knows to call for changes. Advised patient to discuss changes in blood pressures or heart health with provider; Provided education on prescribed diet heart healthy diet ;  Discussed complications of poorly controlled blood pressure such as heart disease, stroke, circulatory complications, vision complications, kidney impairment, sexual dysfunction;  Screening for signs and symptoms of depression related to chronic disease state;  Assessed social determinant of health barriers;   Symptom Management: Take medications as prescribed   Attend all scheduled provider appointments Call provider office for new concerns or questions  call the Suicide and Crisis Lifeline: 988 call the [place] National Suicide Prevention Lifeline: 867-881-4613 or TTY: 703-191-5564 TTY 706 663 4816) to talk to a trained counselor call 1-800-273-TALK (toll free, 24 hour hotline) if experiencing a Mental Health or [place]  check blood pressure 3 times per week learn about high blood pressure take blood pressure log to all doctor appointments call doctor for signs and symptoms of high blood pressure develop an action plan for high blood pressure keep all doctor appointments take medications for blood pressure exactly as prescribed report new symptoms to your doctor eat more whole grains, fruits and vegetables, lean meats and healthy fats  Follow Up Plan: Telephone follow up appointment with care management team member scheduled for: 12-30-2022 at 0900 am          Plan:Telephone follow up  appointment with care management team member scheduled for:  12-30-2022 at 0900 am  [place], MSN, CCM RN Care Manager  Chronic Care Management Direct Number: [phone]

## 2022-11-04 NOTE — Patient Instructions (Signed)
Please call the care guide team at [phone] if you need to cancel or reschedule your appointment.   If you are experiencing a Mental Health or [place] or need someone to talk to, please call the Suicide and Crisis Lifeline: 988 call the [place] National Suicide Prevention Lifeline: 276-670-2910 or TTY: (513) 880-6791 TTY 740-755-8895) to talk to a trained counselor call 1-800-273-TALK (toll free, 24 hour hotline) go to [facility] Deputy [phone])   Following is a copy of the CCM Program Consent:  CCM service includes personalized support from designated clinical staff supervised by the physician, including individualized plan of care and coordination with other care providers 24/7 contact phone numbers for assistance for urgent and routine care needs. Service will only be billed when office clinical staff spend 20 minutes or more in a month to coordinate care. Only one practitioner may furnish and bill the service in a calendar month. The patient may stop CCM services at amy time (effective at the end of the month) by phone call to the office staff. The patient will be responsible for cost sharing (co-pay) or up to 20% of the service fee (after annual deductible is met)  Following is a copy of your full provider care plan:   Goals Addressed             This Visit's Progress    CCM Expected Outcome:  Monitor, Self-Manage and Reduce Symptoms of Heart Failure       Current Barriers:  Knowledge Deficits related to the importance of daily weights in the effective management of HF Care Coordination needs related to [facility] and resources needed for effective medication management  in a patient with HF Chronic Disease Management support and education needs related to effective management of HF Wt Readings from Last 3 Encounters:  11/03/22 137 lb 4.8 oz (62.3 kg)  10/26/22 142 lb (64.4 kg)  07/30/22 141 lb  3.2 oz (64 kg)     Planned Interventions: Basic overview and discussion of pathophysiology of Heart Failure reviewed Provided education on low sodium diet. The patient states compliance with dietary restrictions Reviewed Heart Failure Action Plan in depth. The patient monitors her weight and is around 137 now. She says that she is not having any exacerbations in  her heart failure at this time. Saw vascular doctor yesterday and got a good report. No changes in the plan of care.   Assessed need for readable accurate scales in home. Has scales in her home Provided education about placing scale on hard, flat surface Advised patient to weigh each morning after emptying bladder Discussed importance of daily weight and advised patient to weigh and record daily. The patient states she does not weigh daily. The patient states that she weighs a couple of times a week. Education on the benefits of daily weights and monitoring for changes in her swelling/edema and monitoring for heart failure exacerbations. Ask the patient to call the office for +2/3 pounds in one day or +5 pounds in one week. Will continue to monitor Review of sx and sx of heart failure and ask the patient to monitor for acute changes or onset:  F- Fatigue  A- Activity intolerance  C- Chest congestion or cough  E- Edema or Swelling in feet, legs, abdomen  S- Shortness of breath Reviewed role of diuretics in prevention of fluid overload and management of heart failure Discussed the importance of keeping all appointments with provider Provided patient with  education about the role of exercise in the management of heart failure Advised patient to discuss changes in HF, acute onset of swelling or edema noted, and other sx and sx of HF with provider Screening for signs and symptoms of depression related to chronic disease state  Assessed social determinant of health barriers EF% 60 to 65% 09-15-2021  Symptom Management: Take medications as  prescribed   Attend all scheduled provider appointments Call provider office for new concerns or questions  call the Suicide and Crisis Lifeline: 988 call the [place] National Suicide Prevention Lifeline: 640-484-8008 or TTY: (770)249-2786 TTY (937) 367-3559) to talk to a trained counselor call 1-800-273-TALK (toll free, 24 hour hotline) if experiencing a Mental Health or [place]  call office if I gain more than 2 pounds in one day or 5 pounds in one week use salt in moderation watch for swelling in feet, ankles and legs every day weigh myself daily develop a rescue plan follow rescue plan if symptoms flare-up eat more whole grains, fruits and vegetables, lean meats and healthy fats track symptoms and what helps feel better or worse dress right for the weather, hot or cold  Follow Up Plan: Telephone follow up appointment with care management team member scheduled for: 12-30-2022 at 0900 am       CCM Expected Outcome:  Monitor, Self-Manage, and Reduce Symptoms of Hypertension       Current Barriers:  Knowledge Deficits related to consistent measurement of blood pressures and to report blood pressures that are consistently out of range Chronic Disease Management support and education needs related to effective management of HTN BP Readings from Last 3 Encounters:  11/03/22 137/64  10/26/22 134/70  07/17/22 (!) 150/74     Planned Interventions: Evaluation of current treatment plan related to hypertension self management and patient's adherence to plan as established by provider. The patient has a cardiologist that she sees on a regular basis. States that her blood pressures are stable. She does have a cold and has been to the pcp for this. She is feeling better but it is lingering. She is going to give it a couple more days and then decide if she needs to call the provider back for a follow up. She has a neti pot. Encouraged her to use the neti pot to see if that would help.  Review of what worse looks like. Education and support given.    Provided education to patient re: stroke prevention, s/s of heart attack and stroke; Reviewed prescribed diet heart healthy diet. Review and education provided. The patient watches her sodium content. Says that she has an appetite sometimes and sometimes she does not. She denies any issues with dietary restrictions. Reviewed medications with patient and discussed importance of compliance. The patient is compliant with medications and has worked with pharm D in the past. Ongoing support and medication management with the pharm D. Counseled on the importance of exercise goals with target of 150 minutes per week Discussed plans with patient for ongoing care management follow up and provided patient with direct contact information for care management team; Advised patient, providing education and rationale, to monitor blood pressure daily and record, calling PCP for findings outside established parameters. The patient does take her blood pressures at home. No readings available at the time of the call. Discussed the goals of blood pressures: [id number] systolic and 0000000 diastolic;  Reviewed scheduled/upcoming provider appointments including: Saw the pcp on 10-26-2022 for a sinus infection. Knows to call for  changes. Advised patient to discuss changes in blood pressures or heart health with provider; Provided education on prescribed diet heart healthy diet ;  Discussed complications of poorly controlled blood pressure such as heart disease, stroke, circulatory complications, vision complications, kidney impairment, sexual dysfunction;  Screening for signs and symptoms of depression related to chronic disease state;  Assessed social determinant of health barriers;   Symptom Management: Take medications as prescribed   Attend all scheduled provider appointments Call provider office for new concerns or questions  call the Suicide and Crisis Lifeline:  988 call the [place] National Suicide Prevention Lifeline: 405-667-0415 or TTY: 972-231-4709 TTY (571)753-6500) to talk to a trained counselor call 1-800-273-TALK (toll free, 24 hour hotline) if experiencing a Mental Health or [place]  check blood pressure 3 times per week learn about high blood pressure take blood pressure log to all doctor appointments call doctor for signs and symptoms of high blood pressure develop an action plan for high blood pressure keep all doctor appointments take medications for blood pressure exactly as prescribed report new symptoms to your doctor eat more whole grains, fruits and vegetables, lean meats and healthy fats  Follow Up Plan: Telephone follow up appointment with care management team member scheduled for: 12-30-2022 at 0900 am          Patient verbalizes understanding of instructions and care plan provided today and agrees to view in [place]. Active MyChart status and patient understanding of how to access instructions and care plan via MyChart confirmed with patient.     Telephone follow up appointment with care management team member scheduled for: 12-30-2022 at 9 am

## 2022-11-05 DIAGNOSIS — R296 Repeated falls: Secondary | ICD-10-CM | POA: Diagnosis not present

## 2022-11-05 DIAGNOSIS — M6281 Muscle weakness (generalized): Secondary | ICD-10-CM | POA: Diagnosis not present

## 2022-11-06 DIAGNOSIS — C44519 Basal cell carcinoma of skin of other part of trunk: Secondary | ICD-10-CM | POA: Diagnosis not present

## 2022-11-06 DIAGNOSIS — L814 Other melanin hyperpigmentation: Secondary | ICD-10-CM | POA: Diagnosis not present

## 2022-11-06 DIAGNOSIS — Z85828 Personal history of other malignant neoplasm of skin: Secondary | ICD-10-CM | POA: Diagnosis not present

## 2022-11-06 DIAGNOSIS — L910 Hypertrophic scar: Secondary | ICD-10-CM | POA: Diagnosis not present

## 2022-11-06 DIAGNOSIS — L570 Actinic keratosis: Secondary | ICD-10-CM | POA: Diagnosis not present

## 2022-11-06 DIAGNOSIS — L821 Other seborrheic keratosis: Secondary | ICD-10-CM | POA: Diagnosis not present

## 2022-11-10 ENCOUNTER — Other Ambulatory Visit: Payer: Self-pay | Admitting: Cardiovascular Disease

## 2022-11-10 NOTE — Telephone Encounter (Signed)
Please contact patient for overdue 6 month f/u. Last seen by Christell Faith PA 04-21-22.  Refill pending appt.

## 2022-11-11 NOTE — Telephone Encounter (Signed)
Pt is scheduled on 4/17

## 2022-11-12 ENCOUNTER — Encounter: Payer: Self-pay | Admitting: Neurology

## 2022-11-12 ENCOUNTER — Other Ambulatory Visit (INDEPENDENT_AMBULATORY_CARE_PROVIDER_SITE_OTHER): Payer: Medicare HMO

## 2022-11-12 ENCOUNTER — Ambulatory Visit: Payer: Medicare HMO | Admitting: Neurology

## 2022-11-12 VITALS — BP 130/60 | HR 54 | Ht 62.0 in | Wt 138.0 lb

## 2022-11-12 DIAGNOSIS — R2681 Unsteadiness on feet: Secondary | ICD-10-CM | POA: Diagnosis not present

## 2022-11-12 DIAGNOSIS — M542 Cervicalgia: Secondary | ICD-10-CM | POA: Diagnosis not present

## 2022-11-12 DIAGNOSIS — M545 Low back pain, unspecified: Secondary | ICD-10-CM

## 2022-11-12 DIAGNOSIS — R292 Abnormal reflex: Secondary | ICD-10-CM

## 2022-11-12 DIAGNOSIS — M79605 Pain in left leg: Secondary | ICD-10-CM | POA: Diagnosis not present

## 2022-11-12 DIAGNOSIS — M79604 Pain in right leg: Secondary | ICD-10-CM | POA: Diagnosis not present

## 2022-11-12 DIAGNOSIS — R2689 Other abnormalities of gait and mobility: Secondary | ICD-10-CM | POA: Diagnosis not present

## 2022-11-12 NOTE — Patient Instructions (Signed)
While your other lab work and EMG was normal, I am concerned your symptoms could be coming from your neck. I would like to investigate further with the following: -Blood work today -MRI cervical spine (neck) - schedulers will call you to set this up. If you do not hear from anyone in 1-2 weeks, please contact our office.  I will be in touch when I have your results to discuss results and next steps.  Please continue home exercises given by physical therapy.  Be careful to avoid falls (see precautions below).  I want to see you in clinic again in 3 months or sooner if needed.  The physicians and staff at [facility] are committed to providing excellent care. You may receive a survey requesting feedback about your experience at our office. We strive to receive "very good" responses to the survey questions. If you feel that your experience would prevent you from giving the office a "very good " response, please contact our office to try to remedy the situation. We may be reached at [phone]. Thank you for taking the time out of your busy day to complete the survey.  Kai Levins, MD [facility]  Preventing Falls at [facility] are common, often dreaded events in the lives of older people. Aside from the obvious injuries and even death that may result, fall can cause wide-ranging consequences including loss of independence, mental decline, decreased activity and mobility. Younger people are also at risk of falling, especially those with chronic illnesses and fatigue.  Ways to reduce risk for falling Examine diet and medications. Warm foods and alcohol dilate blood vessels, which can lead to dizziness when standing. Sleep aids, antidepressants and pain medications can also increase the likelihood of a fall.  Get a vision exam. Poor vision, cataracts and glaucoma increase the chances of falling.  Check foot gear. Shoes should fit snugly and have a sturdy, nonskid sole and a broad,  low heel  Participate in a physician-approved exercise program to build and maintain muscle strength and improve balance and coordination. Programs that use ankle weights or stretch bands are excellent for muscle-strengthening. Water aerobics programs and low-impact Tai Chi programs have also been shown to improve balance and coordination.  Increase vitamin D intake. Vitamin D improves muscle strength and increases the amount of calcium the body is able to absorb and deposit in bones.  How to prevent falls from common hazards Floors - Remove all loose wires, cords, and throw rugs. Minimize clutter. Make sure rugs are anchored and smooth. Keep furniture in its usual place.  Chairs -- Use chairs with straight backs, armrests and firm seats. Add firm cushions to existing pieces to add height.  Bathroom - Install grab bars and non-skid tape in the tub or shower. Use a bathtub transfer bench or a shower chair with a back support Use an elevated toilet seat and/or safety rails to assist standing from a low surface. Do not use towel racks or bathroom tissue holders to help you stand.  Lighting - Make sure halls, stairways, and entrances are well-lit. Install a night light in your bathroom or hallway. Make sure there is a light switch at the top and bottom of the staircase. Turn lights on if you get up in the middle of the night. Make sure lamps or light switches are within reach of the bed if you have to get up during the night.  Kitchen - Install non-skid rubber mats near the sink and stove. Clean  spills immediately. Store frequently used utensils, pots, pans between waist and eye level. This helps prevent reaching and bending. Sit when getting things out of lower cupboards.  Living room/ Bedrooms - Place furniture with wide spaces in between, giving enough room to move around. Establish a route through the living room that gives you something to hold onto as you walk.  Stairs - Make sure treads, rails,  and rugs are secure. Install a rail on both sides of the stairs. If stairs are a threat, it might be helpful to arrange most of your activities on the lower level to reduce the number of times you must climb the stairs.  Entrances and doorways - Install metal handles on the walls adjacent to the doorknobs of all doors to make it more secure as you travel through the doorway.  Tips for maintaining balance Keep at least one hand free at all times. Try using a backpack or fanny pack to hold things rather than carrying them in your hands. Never carry objects in both hands when walking as this interferes with keeping your balance.  Attempt to swing both arms from front to back while walking. This might require a conscious effort if Parkinson's disease has diminished your movement. It will, however, help you to maintain balance and posture, and reduce fatigue.  Consciously lift your feet off of the ground when walking. Shuffling and dragging of the feet is a common culprit in losing your balance.  When trying to navigate turns, use a "U" technique of facing forward and making a wide turn, rather than pivoting sharply.  Try to stand with your feet shoulder-length apart. When your feet are close together for any length of time, you increase your risk of losing your balance and falling.  Do one thing at a time. Don't try to walk and accomplish another task, such as reading or looking around. The decrease in your automatic reflexes complicates motor function, so the less distraction, the better.  Do not wear rubber or gripping soled shoes, they might "catch" on the floor and cause tripping.  Move slowly when changing positions. Use deliberate, concentrated movements and, if needed, use a grab bar or walking aid. Count 15 seconds between each movement. For example, when rising from a seated position, wait 15 seconds after standing to begin walking.  If balance is a continuous problem, you might want to  consider a walking aid such as a cane, walking stick, or walker. Once you've mastered walking with help, you might be ready to try it on your own again.

## 2022-11-21 ENCOUNTER — Other Ambulatory Visit: Payer: Self-pay | Admitting: Family Medicine

## 2022-11-24 LAB — VITAMIN E
Gamma-Tocopherol (Vit E): 3 mg/L (ref <4.4)
Vitamin E (Alpha Tocopherol): 10.8 mg/L (ref 5.7–19.9)

## 2022-11-24 LAB — COPPER, SERUM: Copper: 148 ug/dL (ref 70–175)

## 2022-11-25 ENCOUNTER — Encounter: Payer: Self-pay | Admitting: Neurology

## 2022-11-25 DIAGNOSIS — H6123 Impacted cerumen, bilateral: Secondary | ICD-10-CM | POA: Diagnosis not present

## 2022-11-25 DIAGNOSIS — H903 Sensorineural hearing loss, bilateral: Secondary | ICD-10-CM | POA: Diagnosis not present

## 2022-11-29 DIAGNOSIS — I110 Hypertensive heart disease with heart failure: Secondary | ICD-10-CM

## 2022-11-29 DIAGNOSIS — I5030 Unspecified diastolic (congestive) heart failure: Secondary | ICD-10-CM

## 2022-12-05 ENCOUNTER — Ambulatory Visit
Admission: RE | Admit: 2022-12-05 | Discharge: 2022-12-05 | Disposition: A | Discharge status: Home / Self Care | Payer: Medicare HMO | Source: Ambulatory Visit | Attending: Neurology | Admitting: Neurology

## 2022-12-05 DIAGNOSIS — R292 Abnormal reflex: Secondary | ICD-10-CM

## 2022-12-05 DIAGNOSIS — R2689 Other abnormalities of gait and mobility: Secondary | ICD-10-CM

## 2022-12-05 DIAGNOSIS — M542 Cervicalgia: Secondary | ICD-10-CM

## 2022-12-05 DIAGNOSIS — M4802 Spinal stenosis, cervical region: Secondary | ICD-10-CM | POA: Diagnosis not present

## 2022-12-05 DIAGNOSIS — M47812 Spondylosis without myelopathy or radiculopathy, cervical region: Secondary | ICD-10-CM | POA: Diagnosis not present

## 2022-12-05 DIAGNOSIS — R2681 Unsteadiness on feet: Secondary | ICD-10-CM

## 2022-12-15 NOTE — Progress Notes (Unsigned)
Cardiology Office Note    Date:  12/16/2022   ID:  [name], DOB 11-Aug-1941, MRN [id number]  PCP:  Joaquim Nam, MD  Cardiologist:  Julien Nordmann, MD  Electrophysiologist:  None   Chief Complaint: Follow-up  History of Present Illness:   Catherine Green is a 82 y.o. female with history of CAD status post three-vessel CABG in 2006, HFpEF, bilateral carotid artery disease status post bilateral CEA followed by vascular surgery, HTN, HLD, frequent PVCs, atrial tachycardia, multiple medication intolerances, back pain status post surgery, and prior tobacco use starting at age 31 quitting at age 53 who presents for follow-up of hypertension.   Cardiac cath from 09/2007 demonstrated an atretic LIMA to LAD with patent vein grafts.   She was seen at her PCPs office in 10/2019 with dyspnea.  BNP mildly elevated at 300.  Her symptoms were felt to be likely multifactorial including volume overload and mild longstanding anemia.  She was started on Lasix 40 mg daily.  Echo in 11/2019 showed an EF of 65 to 70%, grade 3 diastolic dysfunction, normal RV systolic function and RV cavity size, and trivial mitral regurgitation.  Unable to estimate right heart pressures, IVC not documented as being dilated.  Small pleural effusion was noted.  Following this echo she was taking Lasix 40 mg twice daily with continued swelling.  Recheck at her PCPs office in late 12/2019 demonstrated her weight was down 6 pounds, however she continued to note bilateral pedal edema and abdominal bloating as the day progressed.  She continued to have some occasional dyspnea in the afternoons.  Her appetite remained down.  She was seen in the office on 02/06/2020 noting increased dyspnea, PND, 2-3 pillow orthopnea, and increased fatigue.  BP was elevated in the 180s to 200s systolic.  Her weight was down 8 pounds in the prior 3 months with notes indicating she was missing meals.  She reported nausea when eating with associated dysphagia.   Repeat BNP mildly improved at 239 with stable renal function.  She was taking Lasix 40 mg daily.  It was felt there was some component of venous insufficiency with her lower extremity swelling.  It was recommended she increase her Lasix to 40 mg twice daily for 1 week followed by 40 mg daily thereafter.  She was seen in 02/2020 noting improvement in her dyspnea following diuresis and had continued to take Lasix 40 mg twice daily since her visit in 01/2020.  Her weight is down another 6 pounds when compared to her last clinic visit.  She did note some positional dizziness and generalized fatigue.  She was orthostatic in the office that day.  Lasix was decreased to 40 mg daily.  Symptoms felt similar to her presentation leading up to her bypass.  In this setting, she underwent Lexiscan MPI on 03/15/2020 which showed no significant ischemia, normal wall motion, EF 76%, attenuation corrected images with aortic atherosclerosis and coronary artery calcifications, and was overall a low risk scan.  EKG showed PVCs.  CTA of the neck from 04/23/2020 showed no dissection or pseudoaneurysm without interval growth of remodeling/fusiform aneurysm within the right internal carotid artery, 30% left ICA stenosis, and 70% stenosis of the brachiocephalic origin.  There was 40% subclavian artery stenosis.  She was seen in the office on 04/29/2020 noting her dizziness persisted.  Her weight was stable.  She had further decreased her Lasix to 20 mg daily in the setting of ongoing dizziness.  She denied any  shortness of breath or lower extremity swelling.  Orthostatic vital signs remained positive at that visit.  Imdur was discontinued and carvedilol was decreased to 3.125 mg twice daily.  Following this, she contacted our office in 05/2020, noting continued orthostasis, and in this setting carvedilol was held.  She was seen in the office on 05/29/2020, and continued to note positional dizziness.  She was no longer on carvedilol or Imdur though  was taking Lasix 40 mg daily.  Home BP readings were predominantly in the 120s to 130s systolic.  Her dizziness was noted to date back at least to 02/2018.  She declined head CT.  She was advised to follow-up with her PCP.  It was again recommended her Lasix be decreased to 20 mg daily.  She was hypertensive when lying for orthostatic BP with orthostatics continuing to be mildly positive.  Renal artery ultrasound showed no evidence of renal artery stenosis.  She was seen in the office in 07/2020 noting some improvement in her chronic dizziness.  BP readings ranged from the 120s to 170 systolic with most heart rates in the 60s bpm. Carotid artery ultrasound in 07/2020, showed 1-39% bilateral ICA stenosis, antegrade flow along the vertebral arteries bilaterally, normal flow hemodynamics in the bilateral subclavian arteries. Bilateral ABI's were normal in 10/2020.    She was seen in 10/2020 and was doing well from a cardiac perspective.  She did continue to note a significant improvement in her chronic dizziness when having permissive BP.  Home BP readings were in the 150s to 160s for the most part with occasional readings in the 180s systolic.  She was frequently taking prn Coreg, and in this setting, it was recommended she take Coreg 3.125 mg bid.    She was seen on 01/08/2021 noting a several week history of elevated BP readings, particularly in the morning with many readings in the 160s to 190s mmHg systolic.  ReDs vest was elevated at 39%.  It was recommended she start amlodipine 5 mg nightly and continue carvedilol 3.125 mg twice daily.  Her Lasix was titrated to 40 mg daily for 3 days.  Labs obtained at that time showed a BNP of 163.  She underwent echo on 01/10/2021 which showed an EF of 60 to 65%, no regional wall motion abnormalities, indeterminate LV diastolic function parameters, normal RV systolic function and ventricular cavity size, and moderate mitral regurgitation.  She was seen in the office on  01/14/2021 with stable weight and BP of 170/64 in the office.  She continued to note unchanged longstanding lightheadedness and dizziness.  She remained very anxious.  Her shortness of breath did improve some following increased diuresis.  She did note some palpitations.  Given prior noted orthostatic hypotension permissive hypertension was recommended and she was continued on carvedilol and amlodipine.  Lasix was adjusted to 40 mg alternating with 20 mg every other day.  She underwent Zio patch which showed a predominant rhythm of sinus with an average heart rate of 62 bpm (range 48 to 135 bpm), 3 runs of SVT/atrial tachycardia were noted with the fastest interval lasting 9 beats with a maximal rate of 135 bpm and the longest interval lasting 14 seconds with an average rate of 102 bpm.  There were frequent PVCs representing a 13.4% burden.  No patient triggered events were observed.  Given this, it was recommended amlodipine be discontinued and carvedilol be titrated to 6.25 mg twice daily to improve with PVC burden.   She was seen in  the office on 08/08/2021 and was grieving the loss of her sister, daughter, and granddaughter.  With this, she noted intermittent episodes of shortness of breath, particularly when upset.  With her grief, she had not had much of an appetite.  Her weight was down 5 pounds when compared to her prior clinic visit.  She had been started on Zoloft by her PCP, though had not noted an improvement in her grief.  She was without symptoms of angina or decompensation.  She underwent echo on 09/15/2021 which demonstrated an EF of 60 to 65%, no regional wall motion abnormalities, grade 2 diastolic dysfunction, normal RV systolic function and ventricular cavity size, mildly elevated PASP estimated at 43.7 mmHg, moderate mitral regurgitation, and an estimated right atrial pressure of 3 mmHg.   She was seen in the office on 09/25/2021 and continued to feel poorly.  She noted intermittent substernal  chest tightness that radiated to the left upper arm and would last for several minutes with spontaneous resolution.  With these episodes, she noted increased fatigue.  This pain did not feel like her prior angina.  She also continued to note positional dizziness and generalized malaise/fatigue.  She noted no dizziness in the way she felt on days where she took 20 mg of Lasix versus 40 mg of Lasix.  Her previously noted weight loss had stabilized.  She continued to grieve.  Given symptoms, she underwent Lexiscan MPI on 10/01/2021 which showed no evidence of ischemia and was overall low risk.  Coronary artery calcifications were noted in the LAD and RCA territories along with a sending and descending aortic atherosclerosis.   She was seen in the office on 11/19/2021 and continued to note on changed, longstanding dizziness that appeared to be exacerbated with positional changes and eye movement.  Episodes at times were precipitated by a headache.  She also felt like the symptoms came on after taking carvedilol.  She was orthostatic in the office leading her furosemide to be decreased to 20 mg daily.  At her request, we discontinued carvedilol.  She declined replacement medication.  She contacted our office 2 days later noting elevated BP with a headache.  She was subsequently started on hydralazine 25 mg 3 times daily.  She subsequently called on 12/04/2021 complaining of no appetite, nausea, vomiting, and "not feeling well.  Hydralazine was discontinued and she was started on amlodipine 2.5 mg nightly.   She was seen in the office on 01/19/2022, continuing to note unchanged longstanding generalized malaise, fatigue, and dizziness.  Blood pressure readings at home ranged from 120 to 160 mmHg systolic.  She did have an isolated reading in the 190s systolic.  She remained on amlodipine 2.5 mg nightly.  She discontinued BuSpar that was recently started by her PCP as she felt like this made her feel like a "zombie."  No  significant lower extremity swelling or orthopnea.  She did continue to feel like she is depressed following recent losses in her family.  Amlodipine was transitioned from the evening to the morning hours and Toprol-XL 12.5 mg was started nightly.  She was last seen in the office in 03/2022 and remained without symptoms of angina or cardiac decompensation.  She was feeling better than she had in quite a few years.  She did occasionally still feel unsteady on her feet, though this was improved.  Blood pressure readings at home oral typically in the 120s systolic.  She was tolerating cardiac medications without issues.  Overall, she was pleased  with her improvement.  No changes were made at that time.  She comes in doing very well from a cardiac perspective.  She is without symptoms of angina or cardiac decompensation.  She reports this is the best she has felt in quite some time.  No dyspnea, palpitations, dizziness, presyncope, or syncope.  She feels like her energy levels have continued to improve.  She feels more steady on her feet.  Blood pressures have ranged from the 120s to 140s systolic.  No significant lower extremity swelling.  She does note some leg soreness after taking off her compression hose in the evening.  She also notes a headache after taking her evening metoprolol.  Reports a good appetite.  Otherwise, she is without acute concerns at this time.   Labs independently reviewed: 07/2022 - TC 148, TG 136, HDL 60, LDL 61, Hgb 12.2, PLT 359, TSH normal, potassium 4.8, BUN 15, serum creatinine 0.93, albumin 4.5, AST/ALT normal 01/2021 - A1c 5.8  Past Medical History:  Diagnosis Date   Allergy    hay fever   Anemia    Anxiety    Back pain    Carotid artery occlusion    Cerebrovascular disease    extracranial; occlusive   Chicken pox    Coronary artery disease    Depression    Dizziness    Dizziness    DVT (deep venous thrombosis)    Fainting spell    GERD (gastroesophageal reflux  disease)    Headache    Heart murmur    Hyperlipidemia    Hypertension    Leg pain    PONV (postoperative nausea and vomiting)    severe nausea and vomiting   Pre-syncope    PVD (peripheral vascular disease)    endarterectomy by Dr. Arbie Cookey   Seasonal allergies    Shortness of breath dyspnea    wth ambulation at times   Swelling of both ankles    and abdomen; takes Lasix when needed   Thrombophlebitis    following childbirth   Ulcer     Past Surgical History:  Procedure Laterality Date   APPENDECTOMY     BACK SURGERY  01/14/16   BREAST SURGERY     saline implants   CARDIAC CATHETERIZATION     CAROTID ENDARTERECTOMY  1992   CAROTID ENDARTERECTOMY Right February 13, 2005   Re-do Right CE   CATARACT EXTRACTION, BILATERAL  2013   CHOLECYSTECTOMY     CHOLECYSTECTOMY, LAPAROSCOPIC  04/02/14   Dr. Renda Rolls   CORONARY ARTERY BYPASS GRAFT  2006   x3 Dr. Donata Clay   ENDARTERECTOMY Left 07/20/2014   Procedure: ENDARTERECTOMY CAROTID-LEFT;  Surgeon: Larina Earthly, MD;  Location: [facility] OR;  Service: Vascular;  Laterality: Left;   EYE SURGERY Bilateral Feb. 2013   Cataract Left eye   SEPTOPLASTY     with bilateral inferior turbinate reductions   SPINE SURGERY  12/2015   TOTAL ABDOMINAL HYSTERECTOMY     UPPER GASTROINTESTINAL ENDOSCOPY     had polyps removed from esophagus   UPPER GI ENDOSCOPY      Current Medications: Current Meds  Medication Sig   albuterol (VENTOLIN HFA) 108 (90 Base) MCG/ACT inhaler Inhale 2 puffs into the lungs every 6 (six) hours as needed for wheezing or shortness of breath.   aspirin 81 MG tablet Take 1 tablet (81 mg total) by mouth daily.   Cetirizine HCl (ZYRTEC PO) Take 1 tablet by mouth daily.   Coenzyme Q10 (CO Q10)  100 MG CAPS Take 100 mg by mouth daily. prn   furosemide (LASIX) 20 MG tablet TAKE 1 TABLET BY MOUTH DAILY.   gabapentin (NEURONTIN) 400 MG capsule Take 1 capsule (400 mg total) by mouth 3 (three) times daily as needed.   ketoconazole  (NIZORAL) 2 % shampoo Apply topically as directed.   omeprazole (PRILOSEC) 20 MG capsule Take 1 capsule (20 mg total) by mouth 2 (two) times daily before a meal.   polyethylene glycol powder (GLYCOLAX/MIRALAX) 17 GM/SCOOP powder Take 17 g by mouth 2 (two) times daily as needed.   sertraline (ZOLOFT) 100 MG tablet TAKE ONE AND A HALF TABLETS BY MOUTH EVERY DAY   vitamin B-12 (CYANOCOBALAMIN) 1000 MCG tablet Take 1 tablet (1,000 mcg total) by mouth daily.   [DISCONTINUED] amLODipine (NORVASC) 2.5 MG tablet Take 0.5 tablets (1.25 mg total) by mouth daily.   [DISCONTINUED] ezetimibe (ZETIA) 10 MG tablet Take 1 tablet (10 mg total) by mouth daily. Additional refill available at office visit   [DISCONTINUED] metoprolol succinate (TOPROL-XL) 25 MG 24 hr tablet TAKE 1/2 TABLET AT BEDTIME   [DISCONTINUED] potassium chloride (KLOR-CON) 10 MEQ tablet TAKE 1 TABLET BY MOUTH DAILY   [DISCONTINUED] rosuvastatin (CRESTOR) 20 MG tablet TAKE ONE TABLET BY MOUTH DAILY    Allergies:   Lyrica [pregabalin], Hydrocodone, Ivp dye [iodinated contrast media], Lipitor [atorvastatin], Methylprednisolone, Oxycodone hcl, Penicillin g, Shellfish allergy, Buspar [buspirone], Clavulanic acid, Prednisone, Codeine, Cymbalta [duloxetine hcl], Demerol, Doxycycline, Effexor [venlafaxine], Hydrochlorothiazide, Hydroxyzine, Metoprolol, Protonix [pantoprazole sodium], and Trazodone and nefazodone   Social History   Socioeconomic History   Marital status: Married    Spouse name: Fayrene Fearing    Number of children: 1   Years of education: Not on file   Highest education level: 11th grade  Occupational History    Comment: Retired   Tobacco Use   Smoking status: Former    Packs/day: 0.30    Years: 15.00    Additional pack years: 0.00    Total pack years: 4.50    Types: Cigarettes    Quit date: 08/31/1996    Years since quitting: 26.3    Passive exposure: Never   Smokeless tobacco: Never  Vaping Use   Vaping Use: Never used   Substance and Sexual Activity   Alcohol use: No    Alcohol/week: 0.0 standard drinks of alcohol   Drug use: No   Sexual activity: Not Currently  Other Topics Concern   Not on file  Social History Narrative   Lives with husband in a one level    Work - retired Producer, television/film/video   Diet - healthy   Right handed    Caffeine- 1 cup per day.   Social Determinants of Health   Financial Resource Strain: Low Risk  (09/09/2022)   Overall Financial Resource Strain (CARDIA)    Difficulty of Paying Living Expenses: Not hard at all  Food Insecurity: No Food Insecurity (09/09/2022)   Hunger Vital Sign    Worried About Running Out of Food in the Last Year: Never true    Ran Out of Food in the Last Year: Never true  Transportation Needs: No Transportation Needs (09/09/2022)   PRAPARE - Administrator, Civil Service (Medical): No    Lack of Transportation (Non-Medical): No  Physical Activity: Inactive (09/09/2022)   Exercise Vital Sign    Days of Exercise per Week: 0 days    Minutes of Exercise per Session: 0 min  Stress: No Stress Concern Present (09/09/2022)  [employer] of Occupational Health - Occupational Stress Questionnaire    Feeling of Stress : Not at all  Social Connections: Moderately Integrated (09/09/2022)   Social Connection and Isolation Panel [NHANES]    Frequency of Communication with Friends and Family: More than three times a week    Frequency of Social Gatherings with Friends and Family: More than three times a week    Attends Religious Services: More than 4 times per year    Active Member of [employer] or Organizations: No    Attends Engineer, structural: Never    Marital Status: Married     Family History:  The patient's family history includes Aneurysm in her brother and mother; Birth defects in her brother; Bleeding Disorder in her mother and sister; Cancer in her sister; Cirrhosis in her father; Deep vein thrombosis in her brother; Heart attack in her  father; Heart disease in her brother, father, mother, and sister; Hyperlipidemia in her brother, daughter, father, mother, and sister; Hypertension in her brother, daughter, father, mother, sister, and another family member; Peripheral vascular disease in her daughter; Varicose Veins in her mother and sister. There is no history of Breast cancer or Colon cancer.  ROS:   12-point review of systems is negative unless otherwise noted in the HPI.   EKGs/Labs/Other Studies Reviewed:    Studies reviewed were summarized above. The additional studies were reviewed today:  2D echo 11/2019: 1. Left ventricular ejection fraction, by estimation, is 65 to 70%. The  left ventricle has normal function. Left ventricular endocardial border  not optimally defined to evaluate regional wall motion. Left ventricular  diastolic parameters are consistent  with Grade III diastolic dysfunction (restrictive). Elevated left atrial  pressure.   2. Right ventricular systolic function is normal. The right ventricular  size is normal. Tricuspid regurgitation signal is inadequate for assessing  PA pressure.   3. The mitral valve is grossly normal. Trivial mitral valve  regurgitation.   4. The aortic valve was not well visualized. Aortic valve regurgitation  is not visualized. No aortic stenosis is present.   5. The inferior vena cava is normal in size with greater than 50%  respiratory variability, suggesting right atrial pressure of 3 mmHg. __________   Eugenie Birks MPI 02/2020: Pharmacological myocardial perfusion imaging study with no significant  ischemia Normal wall motion, EF estimated at 76% No EKG changes concerning for ischemia at peak stress or in recovery. Resting EKG with PVCs Attenuation correction images with aortic atherosclerosis and coronary calcification Low risk scan ___________   2D echo 01/10/2021:  1. Left ventricular ejection fraction, by estimation, is 60 to 65%. The  left ventricle has  normal function. The left ventricle has no regional  wall motion abnormalities. Left ventricular diastolic parameters are  indeterminate.   2. Right ventricular systolic function is normal. The right ventricular  size is normal. Tricuspid regurgitation signal is inadequate for assessing  PA pressure.   3. The mitral valve was not well visualized. Moderate mitral valve  regurgitation.  __________   Luci Bank patch 12/2020: Predominant underlying rhythm was Sinus Rhythm.  Patient had a min HR of 48 bpm, max HR of 135 bpm, and avg HR of 62 bpm.   3 Supraventricular Tachycardia/atrial tachycardia runs occurred, the run with the fastest interval lasting 9 beats with a max rate of 135 bpm, the  longest lasting 14.0 secs with an avg rate of 102 bpm.     Isolated SVEs were rare (<1.0%), SVE Couplets were rare (<  1.0%), and SVE Triplets were rare  (<1.0%). Isolated VEs were frequent (13.4%, 115534), VE Couplets were rare (<1.0%, 84), and VE Triplets were rare (<1.0%, 1).  Ventricular Bigeminy and Trigeminy were present.   No patient triggered events noted __________   2D echo 09/15/2021: 1. Left ventricular ejection fraction, by estimation, is 60 to 65%. The  left ventricle has normal function. The left ventricle has no regional  wall motion abnormalities. Left ventricular diastolic parameters are  consistent with Grade II diastolic  dysfunction (pseudonormalization).   2. Right ventricular systolic function is normal. The right ventricular  size is normal. There is mildly elevated pulmonary artery systolic  pressure. The estimated right ventricular systolic pressure is 43.7 mmHg.   3. The mitral valve is normal in structure. Moderate mitral valve  regurgitation. No evidence of mitral stenosis.   4. The aortic valve is normal in structure. Aortic valve regurgitation is  not visualized. No aortic stenosis is present.   5. The inferior vena cava is normal in size with greater than 50%  respiratory  variability, suggesting right atrial pressure of 3 mmHg.   Comparison(s): LVEF 60-65%, Moderate MR. __________   Eugenie Birks MPI 10/01/2021:   The study is low risk.   No ST deviation was noted.   Left ventricular function is normal. Calculated LVEF is 79%.   There is no evidence for ischemia.   LAD and RCA calcifications noted.   Ascending and descending aorta calcifications noted.    EKG:  EKG is ordered today.  The EKG ordered today demonstrates NSR, 61 bpm, no acute ST-T changes  Recent Labs: 07/17/2022: ALT 9; BUN 15; Creatinine, Ser 0.93; Hemoglobin 12.2; Platelets 359.0; Potassium 4.8; Sodium 139; TSH 1.02  Recent Lipid Panel    Component Value Date/Time   CHOL 148 07/17/2022 1144   CHOL 110 02/26/2021 1124   TRIG 136.0 07/17/2022 1144   HDL 60.00 07/17/2022 1144   HDL 66 02/26/2021 1124   CHOLHDL 2 07/17/2022 1144   VLDL 27.2 07/17/2022 1144   LDLCALC 61 07/17/2022 1144   LDLCALC 28 02/26/2021 1124   LDLDIRECT 139.1 04/11/2012 1131    PHYSICAL EXAM:    VS:  BP (!) 153/72 (BP Location: Left Arm, Patient Position: Sitting, Cuff Size: Normal)   Pulse 61   Ht  (1.575 m)   Wt 141 lb 9.6 oz (64.2 kg)   SpO2 94%   BMI 25.90 kg/m   BMI: Body mass index is 25.9 kg/m.  Physical Exam Vitals reviewed.  Constitutional:      Appearance: She is well-developed.  HENT:     Head: Normocephalic and atraumatic.  Eyes:     General:        Right eye: No discharge.        Left eye: No discharge.  Neck:     Vascular: No JVD.  Cardiovascular:     Rate and Rhythm: Normal rate and regular rhythm.     Pulses:          Carotid pulses are  on the right side with bruit and  on the left side with bruit.      Posterior tibial pulses are 2+ on the right side and 2+ on the left side.     Heart sounds: S1 normal and S2 normal. Heart sounds not distant. No midsystolic click and no opening snap. Murmur heard.     High-pitched blowing holosystolic murmur is present with a grade of 2/6  at the apex.  No friction rub.  Pulmonary:     Effort: Pulmonary effort is normal. No respiratory distress.     Breath sounds: Normal breath sounds. No decreased breath sounds, wheezing or rales.  Chest:     Chest wall: No tenderness.  Abdominal:     General: There is no distension.  Musculoskeletal:     Cervical back: Normal range of motion.     Right lower leg: No edema.     Left lower leg: No edema.  Skin:    General: Skin is warm and dry.     Nails: There is no clubbing.  Neurological:     Mental Status: She is alert and oriented to person, place, and time.  Psychiatric:        Speech: Speech normal.        Behavior: Behavior normal.        Thought Content: Thought content normal.        Judgment: Judgment normal.     Wt Readings from Last 3 Encounters:  12/16/22 141 lb 9.6 oz (64.2 kg)  11/12/22 138 lb (62.6 kg)  11/03/22 137 lb 4.8 oz (62.3 kg)     ASSESSMENT & PLAN:   HFpEF/pulmonary hypertension: Euvolemic and well compensated.  Weight stable when compared to prior visit in 03/2022.  She remains on a low-dose furosemide 20 mg daily with KCl 10 mEq daily.  Most recent labs showed stable renal function and electrolytes.  Defer adding MRA or SGLT2 inhibitor secondary to multiple medication intolerances.  Labile hypertension with orthostatic hypotension: Blood pressure is mildly elevated in the office today though has been running quite well at outside offices and at home.  Given her history of orthostasis, I elected to defer titration of medication changes at this time.  I will discontinue her metoprolol secondary to headache and asked that she take an additional half tab of amlodipine in the evening hours and metoprolol as placed.  Otherwise, she will continue her morning amlodipine half tab.  CAD status post CABG without angina: She is doing well and without symptoms of angina.  Continue secondary prevention and risk factor modification including aspirin, amlodipine,  ezetimibe, Toprol-XL, and rosuvastatin.  No indication for ischemic testing.  Frequent PVCs/atrial tachycardia: Quiescent.  We will need to monitor off metoprolol.  Mitral regurgitation: Stable on echo last year.  Update periodically.  Carotid artery disease: 1 to 39% bilateral ICA stenosis on carotid artery ultrasound last month.  She remains on aspirin, ezetimibe, and rosuvastatin.  Followed by vascular surgery.  Right subclavian artery stenosis: Recent carotid artery ultrasound showed a stenotic right subclavian artery.  No symptoms of claudication at this time, therefore intervention is unlikely indicated.  All blood pressures should be obtained in the left upper extremity.  Followed by vascular surgery.  Dizziness/malaise/fatigue: Longstanding issue though as of late has been improving.   Disposition: F/u with Dr. Mariah Milling or an APP in 6 months.   Medication Adjustments/Labs and Tests Ordered: Current medicines are reviewed at length with the patient today.  Concerns regarding medicines are outlined above. Medication changes, Labs and Tests ordered today are summarized above and listed in the Patient Instructions accessible in Encounters.   Signed, Eula Listen, PA-C 12/16/2022 9:42 AM     [facility] [place] [address] [place], [place] [address] [phone]

## 2022-12-16 ENCOUNTER — Ambulatory Visit: Payer: Medicare HMO | Attending: Physician Assistant | Admitting: Physician Assistant

## 2022-12-16 ENCOUNTER — Encounter: Payer: Self-pay | Admitting: Physician Assistant

## 2022-12-16 VITALS — BP 153/72 | HR 61 | Ht 62.0 in | Wt 141.6 lb

## 2022-12-16 DIAGNOSIS — I340 Nonrheumatic mitral (valve) insufficiency: Secondary | ICD-10-CM

## 2022-12-16 DIAGNOSIS — I493 Ventricular premature depolarization: Secondary | ICD-10-CM | POA: Diagnosis not present

## 2022-12-16 DIAGNOSIS — I25118 Atherosclerotic heart disease of native coronary artery with other forms of angina pectoris: Secondary | ICD-10-CM | POA: Diagnosis not present

## 2022-12-16 DIAGNOSIS — I779 Disorder of arteries and arterioles, unspecified: Secondary | ICD-10-CM

## 2022-12-16 DIAGNOSIS — E785 Hyperlipidemia, unspecified: Secondary | ICD-10-CM

## 2022-12-16 DIAGNOSIS — I951 Orthostatic hypotension: Secondary | ICD-10-CM

## 2022-12-16 DIAGNOSIS — I5032 Chronic diastolic (congestive) heart failure: Secondary | ICD-10-CM

## 2022-12-16 DIAGNOSIS — R0989 Other specified symptoms and signs involving the circulatory and respiratory systems: Secondary | ICD-10-CM

## 2022-12-16 DIAGNOSIS — I272 Pulmonary hypertension, unspecified: Secondary | ICD-10-CM

## 2022-12-16 DIAGNOSIS — I4719 Other supraventricular tachycardia: Secondary | ICD-10-CM | POA: Diagnosis not present

## 2022-12-16 DIAGNOSIS — Z951 Presence of aortocoronary bypass graft: Secondary | ICD-10-CM | POA: Diagnosis not present

## 2022-12-16 DIAGNOSIS — R5383 Other fatigue: Secondary | ICD-10-CM

## 2022-12-16 DIAGNOSIS — R42 Dizziness and giddiness: Secondary | ICD-10-CM | POA: Diagnosis not present

## 2022-12-16 DIAGNOSIS — I771 Stricture of artery: Secondary | ICD-10-CM

## 2022-12-16 MED ORDER — AMLODIPINE BESYLATE 2.5 MG PO TABS: 1.2500 mg | ORAL_TABLET | Freq: Two times a day (BID) | ORAL | 2 refills | Status: DC

## 2022-12-16 MED ORDER — POTASSIUM CHLORIDE ER 10 MEQ PO TBCR: 10.0000 meq | EXTENDED_RELEASE_TABLET | Freq: Every day | ORAL | 2 refills | Status: DC

## 2022-12-16 MED ORDER — ROSUVASTATIN CALCIUM 20 MG PO TABS: 20.0000 mg | ORAL_TABLET | Freq: Every day | ORAL | 2 refills | Status: DC

## 2022-12-16 MED ORDER — EZETIMIBE 10 MG PO TABS: 10.0000 mg | ORAL_TABLET | Freq: Every day | ORAL | 2 refills | Status: DC

## 2022-12-16 NOTE — Patient Instructions (Addendum)
Medication Instructions:   Your physician has recommended you make the following change in your medication:   STOP Metoprolol Succinate.  START Amlodipine 2.5 mg 1/2 tablet by mouth twice daily.  *If you need a refill on your cardiac medications before your next appointment, please call your pharmacy*   Lab Work:  No labs ordered today.  If you have labs (blood work) drawn today and your tests are completely normal, you will receive your results only by: MyChart Message (if you have MyChart) OR A paper copy in the mail If you have any lab test that is abnormal or we need to change your treatment, we will call you to review the results.   Testing/Procedures:  No testing ordered today.   Follow-Up: At [facility], you and your health needs are our priority.  As part of our continuing mission to provide you with exceptional heart care, we have created designated Provider Care Teams.  These Care Teams include your primary Cardiologist (physician) and Advanced Practice Providers (APPs -  Physician Assistants and Nurse Practitioners) who all work together to provide you with the care you need, when you need it.  We recommend signing up for the patient portal called "MyChart".  Sign up information is provided on this After Visit Summary.  MyChart is used to connect with patients for Virtual Visits (Telemedicine).  Patients are able to view lab/test results, encounter notes, upcoming appointments, etc.  Non-urgent messages can be sent to your provider as well.   To learn more about what you can do with MyChart, go to [email or web address].    Your next appointment:   6 month(s)  Provider:   You may see Julien Nordmann, MD or one of the following Advanced Practice Providers on your designated Care Team:   Nicolasa Ducking, NP Eula Listen, PA-C Cadence Fransico Michael, PA-C Charlsie Quest, NP

## 2022-12-23 ENCOUNTER — Telehealth: Payer: Self-pay | Admitting: Cardiovascular Disease

## 2022-12-23 ENCOUNTER — Other Ambulatory Visit: Payer: Medicare HMO | Admitting: Pharmacist

## 2022-12-23 NOTE — Telephone Encounter (Signed)
Pt c/o medication issue:  1. Name of Medication:   amLODipine (NORVASC) 2.5 MG tablet    2. How are you currently taking this medication (dosage and times per day)?   Take 0.5 tablets (1.25 mg total) by mouth in the morning and at bedtime.    3. Are you having a reaction (difficulty breathing--STAT)? No  4. What is your medication issue?  Pt states that she is only taking above medication in the morning and back to taking Metoprolol at bedtime, due to medication causing her to have headaches at night. Please advise

## 2022-12-23 NOTE — Telephone Encounter (Signed)
Per pt... Pt states that she is only taking above medication in the morning and back to taking Metoprolol at bedtime, due to medication causing her to have headaches at night. Please advise   This is from the pts last note:   STOP Metoprolol Succinate.   START Amlodipine 2.5 mg 1/2 tablet by mouth twice daily.  She says that her BP was very high and she was having headaches... so she changed her meds back to Amlodipine 2.5 mg 1/2 in the morning and Metoprolol 25 mg 1/2 at bedtime and her headaches and BP has been much better and she is feeling very well.   BP readings:   134/64 122/47 137/65 HR 50-60 daily.   I will forward to Eula Listen PA for hie review prior to changing her med list to be sure he ia play with her changes.

## 2022-12-23 NOTE — Progress Notes (Signed)
12/23/2022 Name: Catherine Green MRN: [id number] DOB: 06-26-1941  Chief Complaint  Patient presents with   Medication Management    Catherine Green is a 82 y.o. year old female who presented for a telephone visit.   They were referred to the pharmacist by their PCP for assistance in managing hypertension and heart failure .   Subjective:  Care Team: Primary Care Provider: Joaquim Nam, MD  Cardiologist: Julien Nordmann, MD  Neurologist: Antony Madura, MD; Next Scheduled Visit: 02/10/2023 Neurosurgery: Julio Sicks, MD; Next Scheduled Visit: 12/24/2022  Medication Access/Adherence  Current Pharmacy:  [facility] - [place], [place] - [address] [place] [place] [address] Phone: [phone] Fax: [phone]  [id number] - [place], [place] - [address] [place] [address] Phone: [phone] Fax: [phone]   Patient reports affordability concerns with their medications: No  Patient reports access/transportation concerns to their pharmacy: No  Patient reports adherence concerns with their medications:  No       HFpEF /Hypertension:   Patient followed by [facility] - Note patient with history of orthostatic hypotension  - From review of chart, note at Office Visit with [facility] [place] on 4/17. Provider advise patient: Discontinue metoprolol secondary to headache Increase amlodipine dose to 2.5 mg 1/2 tablet by mouth twice daily   Today patient reports made changes as recommended by Cardiologist, but found that headaches increased and BP was elevated. Reports since returned to taking amlodipine 2.5 mg -  tablet daily and metoprolol ER 25 mg - 1/2 tablet (12.5 mg) QHS   Reports headaches seem to have improved with taking cetirizine 10 mg daily (thinks was related to her allergies).  Current medications:  Beta blocker: metoprolol ER 25 mg - 1/2 tablet (12.5 mg) QHS Diuretic regimen:  furosemide 20 mg daily Potassium supplement: potassium chloride 10 meq daily CCB: amlodipine 2.5 mg - 1/2 tablet (1.25 mg) daily   Current home blood pressure readings: last checked: today, reading: 134/64   Patient denies hypotensive s/sx including dizziness, lightheadedness.    Patient denies any recent signs of swelling/edema   Confirms weighs herself daily as directed   Current physical activity: walks on treadmill ~15-20 minutes x 2-3 days/week and doing home exercises from physical therapy     Hyperlipidemia/ASCVD Risk Reduction   Patient followed by [facility]   Current lipid lowering medications: rosuvastatin 20 mg daily & ezetimibe 10 mg daily   Antiplatelet regimen: aspirin 81 mg   ASCVD History: status post three-vessel CABG in 2006, HFpEF, bilateral carotid artery disease status post bilateral CEA followed by vascular surgery, HTN  Risk Factors: prior tobacco use   Current physical activity: walks on treadmill ~15-20 minutes x 2-3 days/week and doing exercises from physical therapy     Objective:  Lab Results  Component Value Date   CREATININE 0.93 07/17/2022   BUN 15 07/17/2022   NA 139 07/17/2022   K 4.8 07/17/2022   CL 99 07/17/2022   CO2 32 07/17/2022    Lab Results  Component Value Date   CHOL 148 07/17/2022   HDL 60.00 07/17/2022   LDLCALC 61 07/17/2022   LDLDIRECT 139.1 04/11/2012   TRIG 136.0 07/17/2022   CHOLHDL 2 07/17/2022   BP Readings from Last 3 Encounters:  12/16/22 (!) 153/72  11/12/22 130/60  11/03/22 137/64   Pulse Readings from Last 3 Encounters:  12/16/22 61  11/12/22 (!) 54  11/03/22 (!)  59     Medications Reviewed Today     Reviewed by Manuela Neptune, RPH-CPP (Pharmacist) on 12/23/22 at 2233  Med List Status: <None>   Medication Order Taking? Sig Documenting Provider Last Dose Status Informant  albuterol (VENTOLIN HFA) 108 (90 Base) MCG/ACT inhaler [id number]  Inhale 2 puffs into the lungs every 6  (six) hours as needed for wheezing or shortness of breath. Joaquim Nam, MD  Active   amLODipine (NORVASC) 2.5 MG tablet [id number] Yes Take 0.5 tablets (1.25 mg total) by mouth in the morning and at bedtime.  Patient taking differently: Take 1.25 mg by mouth in the morning.   Sondra Barges, PA-C Taking Active   aspirin 81 MG tablet [id number]  Take 1 tablet (81 mg total) by mouth daily. Antonieta Iba, MD  Active   Cetirizine HCl (ZYRTEC PO) [id number] Yes Take 1 tablet by mouth daily. [provider] Taking Active   Coenzyme Q10 (CO Q10) 100 MG CAPS [id number]  Take 100 mg by mouth daily. prn [provider]  Active   ezetimibe (ZETIA) 10 MG tablet [id number]  Take 1 tablet (10 mg total) by mouth daily. Sondra Barges, PA-C  Active   furosemide (LASIX) 20 MG tablet [id number] Yes TAKE 1 TABLET BY MOUTH DAILY. Sondra Barges, PA-C Taking Active   gabapentin (NEURONTIN) 400 MG capsule [id number]  Take 1 capsule (400 mg total) by mouth 3 (three) times daily as needed. Joaquim Nam, MD  Active   ketoconazole (NIZORAL) 2 % shampoo [id number]  Apply topically as directed. [provider]  Active   metoprolol succinate (TOPROL-XL) 25 MG 24 hr tablet [id number] Yes Take 12.5 mg by mouth at bedtime. [provider] Taking Active   omeprazole (PRILOSEC) 20 MG capsule [id number]  Take 1 capsule (20 mg total) by mouth 2 (two) times daily before a meal. Joaquim Nam, MD  Active   polyethylene glycol powder (GLYCOLAX/MIRALAX) 17 GM/SCOOP powder [id number]  Take 17 g by mouth 2 (two) times daily as needed. Joaquim Nam, MD  Active   potassium chloride (KLOR-CON) 10 MEQ tablet [id number] Yes Take 1 tablet (10 mEq total) by mouth daily. Sondra Barges, PA-C Taking Active   rosuvastatin (CRESTOR) 20 MG tablet [id number]  Take 1 tablet (20 mg total) by mouth daily. Sondra Barges, PA-C  Active   sertraline (ZOLOFT) 100 MG tablet [id number]  TAKE ONE AND A HALF TABLETS BY MOUTH EVERY  DAY Joaquim Nam, MD  Active   vitamin B-12 (CYANOCOBALAMIN) 1000 MCG tablet [id number]  Take 1 tablet (1,000 mcg total) by mouth daily. Joaquim Nam, MD  Active               Assessment/Plan:     HFpEF /Hypertension: - Advise patient to follow up with Cardiology office today to let provider know that she has returned to taking amlodipine 2.5 mg -  tablet daily and metoprolol ER 25 mg - 1/2 tablet (12.5 mg) QHS Review record this afternoon to confirm Cardiology contacted. Provider notified and advises okay for patient to continue - Reviewed long term cardiovascular and renal outcomes of uncontrolled blood pressure - Recommended to continue to check home blood pressure and heart rate, keep a log of the results and have this record to review at medical appointments - Reviewed to continue to weigh daily and when to contact cardiology with weight gain    Hyperlipidemia/ASCVD Risk Reduction: - Currently controlled.  Follow Up Plan: Clinical Pharmacist to follow up with patient by telephone on 03/24/2023 at 9:00 AM    Estelle Grumbles, PharmD, [facility] [phone]

## 2022-12-23 NOTE — Telephone Encounter (Signed)
Glad to hear she is feeling better.  Okay to continue amlodipine a half tab of 2.5 mg and Toprol  12.5 mg daily.

## 2022-12-23 NOTE — Patient Instructions (Signed)
Goals Addressed             This Visit's Progress    Pharmacy Goal       Check your blood pressure once daily, and any time you have concerning symptoms like headache, chest pain, dizziness, shortness of breath, or vision changes.    To appropriately check your blood pressure, make sure you do the following:  1) Avoid caffeine, exercise, or tobacco products for 30 minutes before checking. Empty your bladder. 2) Sit with your back supported in a flat-backed chair. Rest your arm on something flat (arm of the chair, table, etc). 3) Sit still with your feet flat on the floor, resting, for at least 5 minutes.  4) Check your blood pressure. Take 1-2 readings.  5) Write down these readings and bring with you to any provider appointments.  Bring your home blood pressure machine with you to a provider's office for accuracy comparison at least once a year.   Make sure you take your blood pressure medications before you come to any office visit, even if you were asked to fast for labs.  Catherine Green, PharmD, BCACP [facility] [phone]         

## 2022-12-24 DIAGNOSIS — Z6825 Body mass index (BMI) 25.0-25.9, adult: Secondary | ICD-10-CM | POA: Diagnosis not present

## 2022-12-24 DIAGNOSIS — G992 Myelopathy in diseases classified elsewhere: Secondary | ICD-10-CM | POA: Diagnosis not present

## 2022-12-24 DIAGNOSIS — M4802 Spinal stenosis, cervical region: Secondary | ICD-10-CM | POA: Diagnosis not present

## 2022-12-25 ENCOUNTER — Telehealth: Payer: Self-pay | Admitting: Cardiovascular Disease

## 2022-12-25 NOTE — Telephone Encounter (Addendum)
   Patient Name: Catherine Green  DOB: 1941/08/06 MRN: [id number]  Primary Cardiologist: Julien Nordmann, MD  Chart reviewed as part of pre-operative protocol coverage. Given past medical history and time since last visit, based on ACC/AHA guidelines, Catherine Green is at acceptable risk for the planned procedure without further cardiovascular testing.   She was most recently seen in the office on 12/16/2022 and was doing very well from a cardiac perspective.  Per Eula Listen, PA, who saw her at the time, "she was euvolemic and well compensated.  At that visit, she was feeling the best that she had in quite some time.  Based off that visit and prior visits/discussion with the patient, she is able to achieve greater than 4 METs without cardiac limitation.  Patient is low risk per RCRI for noncardiac surgery with an estimated rate of 0.9% of adverse cardiac event in the perioperative timeframe.  She may proceed with with noncardiac surgery at an overall low risk without further cardiac intervention.  If not deemed to great of a bleeding risk, would recommend she continue aspirin 81 mg daily in the perioperative timeframe."  I will route this recommendation to the requesting party via Epic fax function and remove from pre-op pool.  Please call with questions.  Joylene Grapes, NP 12/25/2022, 2:17 PM

## 2022-12-25 NOTE — Telephone Encounter (Signed)
We received preoperative cardiac risk assessment request for C5-C6 and C6-C7 anterior cervical fusion with general anesthesia.  She was most recently seen in the office on 12/16/2022 and was doing very well from a cardiac perspective.  She was euvolemic and well compensated.  At that visit, she was feeling the best that she had in quite some time.  Based off that visit and prior visits/discussion with the patient, she is able to achieve greater than 4 METs without cardiac limitation.  Patient is low risk per RCRI for noncardiac surgery with an estimated rate of 0.9% of adverse cardiac event in the perioperative timeframe.  She may proceed with with noncardiac surgery at an overall low risk without further cardiac intervention.  If not deemed to great of a bleeding risk, would recommend she continue aspirin 81 mg daily in the perioperative timeframe.

## 2022-12-25 NOTE — Telephone Encounter (Signed)
   Pre-operative Risk Assessment    Patient Name: Catherine Green  DOB: 09/02/1940 MRN: [id number]     Request for Surgical Clearance    Procedure:   C5-56, C6-7 ANTERIOR CERVICAL FUSION  Date of Surgery:  Clearance TBD                               Surgeon:  DR Julio Sicks Surgeon's Group or Practice Name:  [facility] Phone number:  [phone] Fax number:  [phone]  Type of Clearance Requested:   - Medical    Type of Anesthesia:  General    Additional requests/questions:    SignedDalia Heading   12/25/2022, 11:34 AM

## 2022-12-28 MED ORDER — AMLODIPINE BESYLATE 2.5 MG PO TABS: 1.2500 mg | ORAL_TABLET | Freq: Every morning | ORAL | 3 refills | Status: DC

## 2022-12-28 NOTE — Telephone Encounter (Signed)
Reviewed provider approval and recommendations. She verbalized understanding with no further questions at this time.

## 2022-12-30 ENCOUNTER — Telehealth: Payer: Self-pay

## 2022-12-30 ENCOUNTER — Telehealth: Payer: Medicare HMO

## 2022-12-30 NOTE — Telephone Encounter (Signed)
   CCM RN Visit Note   12-30-2022 Name: [name] MRN: [id number]      DOB: 04/03/1941  Subjective: Catherine Green is a 82 y.o. year old female who is a primary care patient of Dr. Crawford Givens. The patient was referred to the Chronic Care Management team for assistance with care management needs subsequent to provider initiation of CCM services and plan of care.      An unsuccessful telephone outreach was attempted today to contact the patient about Chronic Care Management needs.    Plan:A HIPAA compliant phone message was left for the patient providing contact information and requesting a return call.  Alto Denver RN, MSN, CCM RN Care Manager  Chronic Care Management Direct Number: [phone]

## 2023-01-05 ENCOUNTER — Other Ambulatory Visit: Payer: Self-pay | Admitting: Neurosurgery

## 2023-01-07 NOTE — Progress Notes (Signed)
Surgical Instructions    Your procedure is scheduled on 01/11/23.  Report to [facility] Main Entrance "A" at 11:30 A.M., then check in with the Admitting office.  Call this number if you have problems the morning of surgery:  [phone]   If you have any questions prior to your surgery date call [phone]: Open Monday-Friday 8am-4pm If you experience any cold or flu symptoms such as cough, fever, chills, shortness of breath, etc. between now and your scheduled surgery, please notify us at the above number     Remember:  Do not eat after midnight the night before your surgery  You may drink clear liquids until 10:30AM the morning of your surgery.   Clear liquids allowed are: Water, Non-Citrus Juices (without pulp), Carbonated Beverages, Clear Tea, Black Coffee ONLY (NO MILK, CREAM OR POWDERED CREAMER of any kind), and Gatorade    Take these medicines the morning of surgery with A SIP OF WATER:  amLODipine (NORVASC)  Cetirizine HCl (ZYRTEC PO)  ezetimibe (ZETIA)  omeprazole (PRILOSEC)  rosuvastatin (CRESTOR)  sertraline (ZOLOFT)  If needed: albuterol (VENTOLIN HFA) 108 (90 Base) MCG/ACT inhaler  gabapentin (NEURONTIN)   Follow your surgeon's instructions on when to stop Aspirin.  If no instructions were given by your surgeon then you will need to call the office to get those instructions.    As of today, STOP taking any Aspirin (unless otherwise instructed by your surgeon) Aleve, Naproxen, Ibuprofen, Motrin, Advil, Goody's, BC's, all herbal medications, fish oil, and all vitamins.            [facility] is not responsible for any belongings or valuables.    Do NOT Smoke (Tobacco/Vaping)  24 hours prior to your procedure  If you use a CPAP at night, you may bring your mask for your overnight stay.   Contacts, glasses, hearing aids, dentures or partials may not be worn into surgery, please bring cases for these belongings   For patients admitted to the hospital, discharge  time will be determined by your treatment team.   Patients discharged the day of surgery will not be allowed to drive home, and someone needs to stay with them for 24 hours.   SURGICAL WAITING ROOM VISITATION Patients having surgery or a procedure may have no more than 2 support people in the waiting area - these visitors may rotate.   Children under the age of 5 must have an adult with them who is not the patient. If the patient needs to stay at the hospital during part of their recovery, the visitor guidelines for inpatient rooms apply. Pre-op nurse will coordinate an appropriate time for 1 support person to accompany patient in pre-op.  This support person may not rotate.   Please refer to [email or web address] for the visitor guidelines for Inpatients (after your surgery is over and you are in a regular room).       Day of Surgery:  Take a shower with CHG soap. Wear Clean/Comfortable clothing the morning of surgery Do not wear jewelry or makeup. Do not wear lotions, powders, perfumes/cologne or deodorant. Do not shave 48 hours prior to surgery.  Men may shave face and neck. Do not bring valuables to the hospital. Do not wear nail polish, gel polish, artificial nails, or any other type of covering on natural nails (fingers and toes) If you have artificial nails or gel coating that need to be removed by a nail salon, please have this removed prior to surgery. Artificial nails or gel  coating may interfere with anesthesia's ability to adequately monitor your vital signs. Remember to brush your teeth WITH YOUR REGULAR TOOTHPASTE.    If you received a COVID test during your pre-op visit, it is requested that you wear a mask when out in public, stay away from anyone that may not be feeling well, and notify your surgeon if you develop symptoms. If you have been in contact with anyone that has tested positive in the last 10 days, please  notify your surgeon.    Please read over the following fact sheets that you were given.

## 2023-01-08 ENCOUNTER — Other Ambulatory Visit: Payer: Self-pay

## 2023-01-08 ENCOUNTER — Encounter (HOSPITAL_COMMUNITY): Payer: Self-pay

## 2023-01-08 ENCOUNTER — Encounter (HOSPITAL_COMMUNITY)
Admission: RE | Admit: 2023-01-08 | Discharge: 2023-01-08 | Disposition: A | Discharge status: Home / Self Care | Payer: Medicare HMO | Source: Ambulatory Visit | Attending: Neurosurgery | Admitting: Neurosurgery

## 2023-01-08 ENCOUNTER — Telehealth: Payer: Self-pay | Admitting: Cardiovascular Disease

## 2023-01-08 VITALS — BP 162/44 | HR 62 | Temp 98.5°F | Resp 18 | Ht 62.0 in | Wt 141.9 lb

## 2023-01-08 DIAGNOSIS — Z87891 Personal history of nicotine dependence: Secondary | ICD-10-CM | POA: Diagnosis not present

## 2023-01-08 DIAGNOSIS — I110 Hypertensive heart disease with heart failure: Secondary | ICD-10-CM | POA: Diagnosis not present

## 2023-01-08 DIAGNOSIS — Z951 Presence of aortocoronary bypass graft: Secondary | ICD-10-CM | POA: Insufficient documentation

## 2023-01-08 DIAGNOSIS — K219 Gastro-esophageal reflux disease without esophagitis: Secondary | ICD-10-CM | POA: Diagnosis not present

## 2023-01-08 DIAGNOSIS — I10 Essential (primary) hypertension: Secondary | ICD-10-CM

## 2023-01-08 DIAGNOSIS — E785 Hyperlipidemia, unspecified: Secondary | ICD-10-CM | POA: Diagnosis not present

## 2023-01-08 DIAGNOSIS — Z86718 Personal history of other venous thrombosis and embolism: Secondary | ICD-10-CM | POA: Insufficient documentation

## 2023-01-08 DIAGNOSIS — I2510 Atherosclerotic heart disease of native coronary artery without angina pectoris: Secondary | ICD-10-CM | POA: Insufficient documentation

## 2023-01-08 DIAGNOSIS — Z01812 Encounter for preprocedural laboratory examination: Secondary | ICD-10-CM | POA: Insufficient documentation

## 2023-01-08 DIAGNOSIS — I5030 Unspecified diastolic (congestive) heart failure: Secondary | ICD-10-CM | POA: Diagnosis not present

## 2023-01-08 DIAGNOSIS — I701 Atherosclerosis of renal artery: Secondary | ICD-10-CM | POA: Diagnosis not present

## 2023-01-08 DIAGNOSIS — I951 Orthostatic hypotension: Secondary | ICD-10-CM | POA: Insufficient documentation

## 2023-01-08 DIAGNOSIS — Z01818 Encounter for other preprocedural examination: Secondary | ICD-10-CM

## 2023-01-08 HISTORY — DX: Nonrheumatic mitral (valve) insufficiency: I34.0

## 2023-01-08 LAB — CBC
HCT: 36.2 % (ref 36.0–46.0)
Hemoglobin: 11 g/dL — ABNORMAL LOW (ref 12.0–15.0)
MCH: 25.7 pg — ABNORMAL LOW (ref 26.0–34.0)
MCHC: 30.4 g/dL (ref 30.0–36.0)
MCV: 84.6 fL (ref 80.0–100.0)
Platelets: 283 10*3/uL (ref 150–400)
RBC: 4.28 MIL/uL (ref 3.87–5.11)
RDW: 14.7 % (ref 11.5–15.5)
WBC: 6.7 10*3/uL (ref 4.0–10.5)
nRBC: 0 % (ref 0.0–0.2)

## 2023-01-08 LAB — BASIC METABOLIC PANEL
Anion gap: 8 (ref 5–15)
BUN: 17 mg/dL (ref 8–23)
CO2: 28 mmol/L (ref 22–32)
Calcium: 8.6 mg/dL — ABNORMAL LOW (ref 8.9–10.3)
Chloride: 102 mmol/L (ref 98–111)
Creatinine, Ser: 0.97 mg/dL (ref 0.44–1.00)
GFR, Estimated: 58 mL/min — ABNORMAL LOW (ref >60)
Glucose, Bld: 98 mg/dL (ref 70–99)
Potassium: 4.3 mmol/L (ref 3.5–5.1)
Sodium: 138 mmol/L (ref 135–145)

## 2023-01-08 LAB — SURGICAL PCR SCREEN
MRSA, PCR: NEGATIVE
Staphylococcus aureus: NEGATIVE

## 2023-01-08 NOTE — Progress Notes (Signed)
PCP - Dr. Crawford Givens Cardiologist - Dr. Julien Nordmann  PPM/ICD - denies   Chest x-ray - 01/22/20 EKG - 12/16/22 Stress Test - 10/01/21 ECHO - 09/18/21 Cardiac Cath - 09/07/07  Sleep Study - denies   DM- denies  Blood Thinner Instructions: n/a Aspirin Instructions: Hold 1 week (last dose 5/7)  ERAS Protcol - no, NPO   COVID TEST- n/a   Anesthesia review: yes, cardiac hx. Cardiac clearance note 4/26  Patient denies shortness of breath, fever, cough and chest pain at PAT appointment   All instructions explained to the patient, with a verbal understanding of the material. Patient agrees to go over the instructions while at home for a better understanding.  The opportunity to ask questions was provided.

## 2023-01-08 NOTE — Telephone Encounter (Signed)
Need patient last EKG to be re scanned in unable to read page. Please advise

## 2023-01-08 NOTE — Progress Notes (Signed)
Spoke with the pt, she will arrive on Monday at 1040, NPO post mn.

## 2023-01-08 NOTE — Progress Notes (Signed)
Anesthesia Chart Review:  Case: [id number] Date/Time: 01/11/23 1318   Procedure: ACDF C5-C6 - C6-C7 - 3C   Anesthesia type: General   Pre-op diagnosis: Stenosis   Location: [facility] ROOM [facility] / [facility]   Surgeons: Julio Sicks, MD       DISCUSSION: Patient is an 82 year old female scheduled for the above procedure.   History includes former smoker (quit 08/31/96), post-operative N/V, HTN (with orthostatic hypotension/presyncope ~ 2021), HLD, CAD (CABG: LIMA-LAD, SVG-DIAG, SVG-CX Marginal 06/14/03), murmur (moderate MR 09/15/21), HFpEF, exertional dyspnea, carotid artery disease (right CEA ~ 1992, redo right CEA with vein patch angioplasty for right carotid pseudoaneurysm 02/13/05; left CEA 07/20/14), CVA/TIA (1991), DVT (> 8 years ago), edema, GERD.  She had a low risk stress test on 10/01/21, EF 79%. 1-39% bilateral ICA stenosis by 11/03/22 [place]. 09/15/21 echo showed LVEF 60-65%, grade 2 diastolic dysfunction, mildly elevated PASP, RVSP 43.7 mmHg, moderate MR. May 2022 Zio patch monitor showed predominant SR with 3 brief runs of SVT/atrial tachycardia, increased PVC burden on 13.4%. Amlodipine was discontinued and Carvedilol increased then, but medications have since been adjusted due to side effects. Current medications include amlodipine 2.5 mg daily, Lasix 20 mg daily, Toprol 12.5 mg Q HS, Crestor 20 mg daily, Zetia 10 mg daily.   Last cardiology visit with Eula Listen, PA-C on 12/16/22. No new testing ordered with six month follow-up planned. He later outlined Preoperative cardiology input on 12/25/22: "We received preoperative cardiac risk assessment request for C5-C6 and C6-C7 anterior cervical fusion with general anesthesia.  She was most recently seen in the office on 12/16/2022 and was doing very well from a cardiac perspective.  She was euvolemic and well compensated.  At that visit, she was feeling the best that she had in quite some time.  Based off that visit and prior visits/discussion with the patient, she  is able to achieve greater than 4 METs without cardiac limitation.  Patient is low risk per RCRI for noncardiac surgery with an estimated rate of 0.9% of adverse cardiac event in the perioperative timeframe.  She may proceed with with noncardiac surgery at an overall low risk without further cardiac intervention.  If not deemed to great of a bleeding risk, would recommend she continue aspirin 81 mg daily in the perioperative timeframe." She reported last ASA 01/05/23.  Anesthesia team to evaluate on the day of surgery.   VS: BP (!) 162/44   Pulse 62   Temp 36.9 C   Resp 18   Ht 5\' 2"  (1.575 m)   Wt 64.4 kg   SpO2 91%   BMI 25.95 kg/m    PROVIDERS: Joaquim Nam, MD is PCP     Julien Nordmann, MD is cardiologist Doreatha Massed, PA-C is vascular surgery provider, last visit 11/03/22 with one year follow-up recommended.    LABS: Labs reviewed: Acceptable for surgery. (all labs ordered are listed, but only abnormal results are displayed)  Labs Reviewed  BASIC METABOLIC PANEL - Abnormal; Notable for the following components:      Result Value   Calcium 8.6 (*)    GFR, Estimated 58 (*)    All other components within normal limits  CBC - Abnormal; Notable for the following components:   Hemoglobin 11.0 (*)    MCH 25.7 (*)    All other components within normal limits  SURGICAL PCR SCREEN     IMAGES: MRI C-spine 12/05/22: IMPRESSION: 1. Degenerative spondylosis at C5-6 with endplate osteophytes and broad-based  disc herniation more prominent to the right of midline. Spinal stenosis with AP diameter of the canal only 6.5 mm. Effacement of the subarachnoid space and some deformity of the cord, more on the right. Early abnormal T2 signal within the cord at this level. Mild to moderate bilateral foraminal stenosis. Patient would be at risk of compressive myelopathy based on these findings. 2. C6-7: Endplate osteophytes and broad-based disc herniation with slight caudal down  turning. Spinal stenosis with AP diameter of the canal only 6.7 mm. Mild cord deformity. Moderate bilateral foraminal stenosis. Some risk of compressive myelopathy at this level as well. 3. C4-5: Endplate osteophytes and small central disc protrusion. AP diameter of the canal 8.2 mm. Mild left foraminal narrowing. 4. Chronic facet fusion on the right at C3-4.    EKG: EKG 12/16/22: Incomplete tracing scanned into [facility].  EKG 04/21/22: NSR. Non-specific T wave abnormality.   CV: US Carotid 11/03/22: Summary:  Right Carotid: Velocities in the right ICA are consistent with a 1-39%  stenosis.  Left Carotid: Velocities in the left ICA are consistent with a 1-39%  stenosis.  Vertebrals: Bilateral vertebral arteries demonstrate antegrade flow.  Subclavians: Right subclavian artery was stenotic. Normal flow  hemodynamics were seen in the left subclavian artery.  NOTE: Calcific plaque may obscure higher velocity.    Nuclear stress test 10/01/21:   The study is low risk.   No ST deviation was noted.   Left ventricular function is normal. Calculated LVEF is 79%.   There is no evidence for ischemia.   LAD and RCA calcifications noted.   Ascending and descending aorta calcifications noted.   Echo 09/15/21: IMPRESSIONS   1. Left ventricular ejection fraction, by estimation, is 60 to 65%. The  left ventricle has normal function. The left ventricle has no regional  wall motion abnormalities. Left ventricular diastolic parameters are  consistent with Grade II diastolic  dysfunction (pseudonormalization).   2. Right ventricular systolic function is normal. The right ventricular  size is normal. There is mildly elevated pulmonary artery systolic  pressure. The estimated right ventricular systolic pressure is 43.7 mmHg.   3. The mitral valve is normal in structure. Moderate mitral valve  regurgitation. No evidence of mitral stenosis.   4. The aortic valve is normal in structure. Aortic valve  regurgitation is  not visualized. No aortic stenosis is present.   5. The inferior vena cava is normal in size with greater than 50%  respiratory variability, suggesting right atrial pressure of 3 mmHg.  - Comparison(s): LVEF 60-65%, Moderate MR.    Long term event monitor 01/14/21 - 01/24/21: Predominant underlying rhythm was Sinus Rhythm.  Patient had a min HR of 48 bpm, max HR of 135 bpm, and avg HR of 62 bpm.   3 Supraventricular Tachycardia/atrial tachycardia runs occurred, the run with the fastest interval lasting 9 beats with a max rate of 135 bpm, the  longest lasting 14.0 secs with an avg rate of 102 bpm.     Isolated SVEs were rare (<1.0%), SVE Couplets were rare (<1.0%), and SVE Triplets were rare  (<1.0%). Isolated VEs were frequent (13.4%, 115534), VE Couplets were rare (<1.0%, 84), and VE Triplets were rare (<1.0%, 1).  Ventricular Bigeminy and Trigeminy were present.   No patient triggered events noted   Cardiac cath on 09/07/07: 1. Significant left main and one-vessel CAD. 2. Patent but small left internal mammary artery to the left anterior descending. 3. Patent vein graft to diagonal with no significant disease  in the body of the graft to distal graft insertion. 4. Patent vein graft to obtuse marginal with no significant disease in the body of the graft or distal to graft insertion. 5. Normal left ventricular systolic function. EF 60%. 6. Mild left renal artery stenosis. 7. Systemic hypertension. 8. Elevated left ventricular end-diastolic pressure.     Past Medical History:  Diagnosis Date   Allergy    hay fever   Anemia    Anxiety    Back pain    Carotid artery occlusion    Cerebrovascular disease    extracranial; occlusive   Chicken pox    Coronary artery disease    Depression    Dizziness    Dizziness    DVT (deep venous thrombosis) (HCC)    Fainting spell    GERD (gastroesophageal reflux disease)    Headache    Heart murmur    Hyperlipidemia     Hypertension    Leg pain    PONV (postoperative nausea and vomiting)    severe nausea and vomiting   Pre-syncope    PVD (peripheral vascular disease) (HCC)    endarterectomy by Dr. Arbie Cookey   Seasonal allergies    Shortness of breath dyspnea    wth ambulation at times   Swelling of both ankles    and abdomen; takes Lasix when needed   Thrombophlebitis    following childbirth   Ulcer     Past Surgical History:  Procedure Laterality Date   APPENDECTOMY     BREAST SURGERY     saline implants   CARDIAC CATHETERIZATION     CAROTID ENDARTERECTOMY  1992   CAROTID ENDARTERECTOMY Right 02/13/2005   Re-do Right CE   CATARACT EXTRACTION, BILATERAL  2013   CHOLECYSTECTOMY, LAPAROSCOPIC  04/02/2014   Dr. Renda Rolls   CORONARY ARTERY BYPASS GRAFT  2006   x3 Dr. Donata Clay   ENDARTERECTOMY Left 07/20/2014   Procedure: ENDARTERECTOMY CAROTID-LEFT;  Surgeon: Larina Earthly, MD;  Location: [facility] OR;  Service: Vascular;  Laterality: Left;   EYE SURGERY Bilateral 10/2011   Cataract Left eye   SEPTOPLASTY     with bilateral inferior turbinate reductions   SPINE SURGERY  12/2015   TOTAL ABDOMINAL HYSTERECTOMY     UPPER GASTROINTESTINAL ENDOSCOPY     had polyps removed from esophagus   UPPER GI ENDOSCOPY      MEDICATIONS:  albuterol (VENTOLIN HFA) 108 (90 Base) MCG/ACT inhaler   amLODipine (NORVASC) 2.5 MG tablet   aspirin 81 MG tablet   Coenzyme Q10 (CO Q10) 100 MG CAPS   ezetimibe (ZETIA) 10 MG tablet   furosemide (LASIX) 20 MG tablet   gabapentin (NEURONTIN) 400 MG capsule   loratadine (CLARITIN) 10 MG tablet   metoprolol succinate (TOPROL-XL) 25 MG 24 hr tablet   omeprazole (PRILOSEC) 20 MG capsule   polyethylene glycol powder (GLYCOLAX/MIRALAX) 17 GM/SCOOP powder   potassium chloride (KLOR-CON) 10 MEQ tablet   rosuvastatin (CRESTOR) 20 MG tablet   sertraline (ZOLOFT) 100 MG tablet   vitamin B-12 (CYANOCOBALAMIN) 1000 MCG tablet   No current facility-administered medications for  this encounter.    Shonna Chock, PA-C Surgical Short Stay/Anesthesiology [facility] Phone [phone] [facility] Phone [phone] 01/08/2023 5:12 PM

## 2023-01-08 NOTE — Anesthesia Preprocedure Evaluation (Signed)
Anesthesia Evaluation  Patient identified by MRN, date of birth, ID band Patient awake    Reviewed: Allergy & Precautions, NPO status , Patient's Chart, lab work & pertinent test results  History of Anesthesia Complications (+) PONV and history of anesthetic complications  Airway Mallampati: II  TM Distance: >3 FB Neck ROM: Limited    Dental  (+) Edentulous Upper, Edentulous Lower   Pulmonary former smoker   Pulmonary exam normal        Cardiovascular hypertension, Pt. on medications and Pt. on home beta blockers + CAD, + CABG, + Peripheral Vascular Disease, +CHF and + DVT   Rhythm:Regular Rate:Normal  ECHO 2023: Echo 09/15/21: IMPRESSIONS   1. Left ventricular ejection fraction, by estimation, is 60 to 65%. The  left ventricle has normal function. The left ventricle has no regional  wall motion abnormalities. Left ventricular diastolic parameters are  consistent with Grade II diastolic  dysfunction (pseudonormalization).   2. Right ventricular systolic function is normal. The right ventricular  size is normal. There is mildly elevated pulmonary artery systolic  pressure. The estimated right ventricular systolic pressure is 43.7 mmHg.   3. The mitral valve is normal in structure. Moderate mitral valve  regurgitation. No evidence of mitral stenosis.   4. The aortic valve is normal in structure. Aortic valve regurgitation is  not visualized. No aortic stenosis is present.   5. The inferior vena cava is normal in size with greater than 50%  respiratory variability, suggesting right atrial pressure of 3 mmHg.  - Comparison(s): LVEF 60-65%, Moderate MR.       Neuro/Psych  Headaches  Anxiety Depression       GI/Hepatic Neg liver ROS,GERD  Medicated,,  Endo/Other  negative endocrine ROS    Renal/GU negative Renal ROS  negative genitourinary   Musculoskeletal  (+) Arthritis , Osteoarthritis,    Abdominal Normal  abdominal exam  (+)   Peds  Hematology  (+) Blood dyscrasia, anemia Lab Results      Component                Value               Date                      WBC                      6.7                 01/08/2023                HGB                      11.0 (L)            01/08/2023                HCT                      36.2                01/08/2023                MCV                      84.6                01/08/2023  PLT                      283                 01/08/2023             Lab Results      Component                Value               Date                      NA                       138                 01/08/2023                K                        4.3                 01/08/2023                CO2                      28                  01/08/2023                GLUCOSE                  98                  01/08/2023                BUN                      17                  01/08/2023                CREATININE               0.97                01/08/2023                CALCIUM                  8.6 (L)             01/08/2023                EGFR                     69                  01/21/2021                GFRNONAA                 58 (L)              01/08/2023              Anesthesia Other Findings   Reproductive/Obstetrics  Anesthesia Physical Anesthesia Plan  ASA: 3  Anesthesia Plan: General   Post-op Pain Management: Tylenol PO (pre-op)*   Induction: Intravenous  PONV Risk Score and Plan: 4 or greater and Ondansetron, Dexamethasone, Treatment may vary due to age or medical condition, TIVA and Amisulpride  Airway Management Planned: Mask, Oral ETT and Video Laryngoscope Planned  Additional Equipment: None  Intra-op Plan:   Post-operative Plan: Extubation in OR  Informed Consent: I have reviewed the patients History and Physical, chart, labs and discussed the procedure  including the risks, benefits and alternatives for the proposed anesthesia with the patient or authorized representative who has indicated his/her understanding and acceptance.     Dental advisory given  Plan Discussed with: CRNA  Anesthesia Plan Comments: (PAT note written 01/08/2023 by Shonna Chock, PA-C.   )       Anesthesia Quick Evaluation

## 2023-01-11 ENCOUNTER — Encounter (HOSPITAL_COMMUNITY): Payer: Self-pay | Admitting: Neurosurgery

## 2023-01-11 ENCOUNTER — Ambulatory Visit (HOSPITAL_COMMUNITY)
Admission: RE | Disposition: A | Discharge status: Home / Self Care | Payer: Self-pay | Source: Home / Self Care | Attending: Neurosurgery

## 2023-01-11 ENCOUNTER — Observation Stay (HOSPITAL_COMMUNITY)
Admission: RE | Admit: 2023-01-11 | Discharge: 2023-01-12 | Disposition: A | Discharge status: Home / Self Care | Payer: Medicare HMO | Attending: Neurosurgery | Admitting: Neurosurgery

## 2023-01-11 ENCOUNTER — Other Ambulatory Visit: Payer: Self-pay

## 2023-01-11 ENCOUNTER — Ambulatory Visit (HOSPITAL_COMMUNITY): Payer: Medicare HMO

## 2023-01-11 ENCOUNTER — Ambulatory Visit (HOSPITAL_COMMUNITY): Payer: Medicare HMO | Admitting: Certified Registered"

## 2023-01-11 ENCOUNTER — Ambulatory Visit (HOSPITAL_BASED_OUTPATIENT_CLINIC_OR_DEPARTMENT_OTHER): Payer: Medicare HMO | Admitting: Certified Registered"

## 2023-01-11 DIAGNOSIS — Z7982 Long term (current) use of aspirin: Secondary | ICD-10-CM | POA: Insufficient documentation

## 2023-01-11 DIAGNOSIS — I10 Essential (primary) hypertension: Secondary | ICD-10-CM | POA: Diagnosis not present

## 2023-01-11 DIAGNOSIS — M50022 Cervical disc disorder at C5-C6 level with myelopathy: Secondary | ICD-10-CM | POA: Diagnosis not present

## 2023-01-11 DIAGNOSIS — Z87891 Personal history of nicotine dependence: Secondary | ICD-10-CM | POA: Diagnosis not present

## 2023-01-11 DIAGNOSIS — I110 Hypertensive heart disease with heart failure: Secondary | ICD-10-CM | POA: Diagnosis not present

## 2023-01-11 DIAGNOSIS — M4712 Other spondylosis with myelopathy, cervical region: Principal | ICD-10-CM | POA: Diagnosis present

## 2023-01-11 DIAGNOSIS — Z8673 Personal history of transient ischemic attack (TIA), and cerebral infarction without residual deficits: Secondary | ICD-10-CM | POA: Insufficient documentation

## 2023-01-11 DIAGNOSIS — M4802 Spinal stenosis, cervical region: Secondary | ICD-10-CM

## 2023-01-11 DIAGNOSIS — Z86718 Personal history of other venous thrombosis and embolism: Secondary | ICD-10-CM | POA: Insufficient documentation

## 2023-01-11 DIAGNOSIS — I2510 Atherosclerotic heart disease of native coronary artery without angina pectoris: Secondary | ICD-10-CM | POA: Insufficient documentation

## 2023-01-11 DIAGNOSIS — Z79899 Other long term (current) drug therapy: Secondary | ICD-10-CM | POA: Insufficient documentation

## 2023-01-11 DIAGNOSIS — M4722 Other spondylosis with radiculopathy, cervical region: Secondary | ICD-10-CM | POA: Insufficient documentation

## 2023-01-11 DIAGNOSIS — Z981 Arthrodesis status: Secondary | ICD-10-CM | POA: Diagnosis not present

## 2023-01-11 DIAGNOSIS — I509 Heart failure, unspecified: Secondary | ICD-10-CM

## 2023-01-11 DIAGNOSIS — Z951 Presence of aortocoronary bypass graft: Secondary | ICD-10-CM | POA: Insufficient documentation

## 2023-01-11 HISTORY — PX: ANTERIOR CERVICAL DECOMP/DISCECTOMY FUSION: SHX1161

## 2023-01-11 SURGERY — ANTERIOR CERVICAL DECOMPRESSION/DISCECTOMY FUSION 2 LEVELS
Anesthesia: General

## 2023-01-11 MED ORDER — ONDANSETRON HCL 4 MG/2ML IJ SOLN: 4.0000 mg | Freq: Four times a day (QID) | INTRAMUSCULAR | Status: DC | PRN

## 2023-01-11 MED ORDER — LACTATED RINGERS IV SOLN: INTRAVENOUS | Status: DC | PRN

## 2023-01-11 MED ORDER — THROMBIN 5000 UNITS EX SOLR
CUTANEOUS | Status: AC
  Filled 2023-01-11: qty 5000

## 2023-01-11 MED ORDER — FENTANYL CITRATE (PF) 100 MCG/2ML IJ SOLN: 25.0000 ug | INTRAMUSCULAR | Status: DC | PRN

## 2023-01-11 MED ORDER — FENTANYL CITRATE (PF) 100 MCG/2ML IJ SOLN
INTRAMUSCULAR | Status: DC | PRN
  Administered 2023-01-11 (×2): 50 ug via INTRAVENOUS

## 2023-01-11 MED ORDER — SERTRALINE HCL 50 MG PO TABS
50.0000 mg | ORAL_TABLET | Freq: Every day | ORAL | Status: DC
  Administered 2023-01-12: 50 mg via ORAL
  Filled 2023-01-11: qty 1

## 2023-01-11 MED ORDER — CO Q10 100 MG PO CAPS: 100.0000 mg | ORAL_CAPSULE | Freq: Every day | ORAL | Status: DC

## 2023-01-11 MED ORDER — HYDROCODONE-ACETAMINOPHEN 5-325 MG PO TABS
1.0000 | ORAL_TABLET | ORAL | Status: DC | PRN
Start: 2023-01-11 — End: 2023-01-11

## 2023-01-11 MED ORDER — CYCLOBENZAPRINE HCL 5 MG PO TABS: 5.0000 mg | ORAL_TABLET | Freq: Three times a day (TID) | ORAL | Status: DC | PRN

## 2023-01-11 MED ORDER — LACTATED RINGERS IV SOLN: INTRAVENOUS | Status: DC

## 2023-01-11 MED ORDER — METOPROLOL SUCCINATE ER 25 MG PO TB24
12.5000 mg | ORAL_TABLET | Freq: Every day | ORAL | Status: DC
  Administered 2023-01-11 – 2023-01-12 (×2): 12.5 mg via ORAL
  Filled 2023-01-11 (×2): qty 1

## 2023-01-11 MED ORDER — 0.9 % SODIUM CHLORIDE (POUR BTL) OPTIME
TOPICAL | Status: DC | PRN
  Administered 2023-01-11: 1000 mL

## 2023-01-11 MED ORDER — SUGAMMADEX SODIUM 200 MG/2ML IV SOLN
INTRAVENOUS | Status: DC | PRN
  Administered 2023-01-11: 200 mg via INTRAVENOUS

## 2023-01-11 MED ORDER — FENTANYL CITRATE (PF) 250 MCG/5ML IJ SOLN
INTRAMUSCULAR | Status: AC
  Filled 2023-01-11: qty 5

## 2023-01-11 MED ORDER — CEFAZOLIN SODIUM-DEXTROSE 1-4 GM/50ML-% IV SOLN
1.0000 g | Freq: Three times a day (TID) | INTRAVENOUS | Status: AC
  Administered 2023-01-11 – 2023-01-12 (×2): 1 g via INTRAVENOUS
  Filled 2023-01-11 (×2): qty 50

## 2023-01-11 MED ORDER — VITAMIN B-12 1000 MCG PO TABS
1000.0000 ug | ORAL_TABLET | Freq: Every day | ORAL | Status: DC
  Administered 2023-01-12: 1000 ug via ORAL
  Filled 2023-01-11: qty 1

## 2023-01-11 MED ORDER — SODIUM CHLORIDE 0.9% FLUSH
3.0000 mL | Freq: Two times a day (BID) | INTRAVENOUS | Status: DC
  Administered 2023-01-11: 3 mL via INTRAVENOUS

## 2023-01-11 MED ORDER — PHENOL 1.4 % MT LIQD
1.0000 | OROMUCOSAL | Status: DC | PRN
  Administered 2023-01-12: 1 via OROMUCOSAL
  Filled 2023-01-11: qty 177

## 2023-01-11 MED ORDER — GABAPENTIN 400 MG PO CAPS
400.0000 mg | ORAL_CAPSULE | Freq: Three times a day (TID) | ORAL | Status: DC
  Administered 2023-01-11 – 2023-01-12 (×2): 400 mg via ORAL
  Filled 2023-01-11 (×2): qty 1

## 2023-01-11 MED ORDER — DEXAMETHASONE SODIUM PHOSPHATE 10 MG/ML IJ SOLN
INTRAMUSCULAR | Status: DC | PRN
  Administered 2023-01-11: 5 mg via INTRAVENOUS

## 2023-01-11 MED ORDER — AMLODIPINE BESYLATE 2.5 MG PO TABS
1.2500 mg | ORAL_TABLET | Freq: Every morning | ORAL | Status: DC
  Administered 2023-01-12: 1.25 mg via ORAL
  Filled 2023-01-11: qty 0.5

## 2023-01-11 MED ORDER — VANCOMYCIN HCL IN DEXTROSE 1-5 GM/200ML-% IV SOLN
1000.0000 mg | INTRAVENOUS | Status: DC
  Filled 2023-01-11: qty 200

## 2023-01-11 MED ORDER — LIDOCAINE 2% (20 MG/ML) 5 ML SYRINGE
INTRAMUSCULAR | Status: DC | PRN
  Administered 2023-01-11: 60 mg via INTRAVENOUS

## 2023-01-11 MED ORDER — ACETAMINOPHEN 325 MG PO TABS
ORAL_TABLET | ORAL | Status: AC
  Filled 2023-01-11: qty 2

## 2023-01-11 MED ORDER — LORATADINE 10 MG PO TABS
10.0000 mg | ORAL_TABLET | Freq: Every day | ORAL | Status: DC
  Administered 2023-01-12: 10 mg via ORAL
  Filled 2023-01-11 (×2): qty 1

## 2023-01-11 MED ORDER — FUROSEMIDE 20 MG PO TABS
20.0000 mg | ORAL_TABLET | Freq: Every day | ORAL | Status: DC
  Administered 2023-01-12: 20 mg via ORAL
  Filled 2023-01-11 (×2): qty 1

## 2023-01-11 MED ORDER — EPHEDRINE SULFATE-NACL 50-0.9 MG/10ML-% IV SOSY
PREFILLED_SYRINGE | INTRAVENOUS | Status: DC | PRN
  Administered 2023-01-11: 5 mg via INTRAVENOUS

## 2023-01-11 MED ORDER — PROPOFOL 10 MG/ML IV BOLUS
INTRAVENOUS | Status: AC
  Filled 2023-01-11: qty 20

## 2023-01-11 MED ORDER — ONDANSETRON HCL 4 MG/2ML IJ SOLN
INTRAMUSCULAR | Status: DC | PRN
  Administered 2023-01-11: 4 mg via INTRAVENOUS

## 2023-01-11 MED ORDER — ROCURONIUM BROMIDE 10 MG/ML (PF) SYRINGE
PREFILLED_SYRINGE | INTRAVENOUS | Status: AC
  Filled 2023-01-11: qty 10

## 2023-01-11 MED ORDER — CHLORHEXIDINE GLUCONATE CLOTH 2 % EX PADS: 6.0000 | MEDICATED_PAD | Freq: Once | CUTANEOUS | Status: DC

## 2023-01-11 MED ORDER — CEFAZOLIN SODIUM-DEXTROSE 2-3 GM-%(50ML) IV SOLR
INTRAVENOUS | Status: DC | PRN
  Administered 2023-01-11: 2 g via INTRAVENOUS

## 2023-01-11 MED ORDER — ACETAMINOPHEN 500 MG PO TABS
1000.0000 mg | ORAL_TABLET | Freq: Four times a day (QID) | ORAL | Status: DC
  Administered 2023-01-11 – 2023-01-12 (×2): 1000 mg via ORAL
  Filled 2023-01-11 (×2): qty 2

## 2023-01-11 MED ORDER — ROCURONIUM BROMIDE 100 MG/10ML IV SOLN
INTRAVENOUS | Status: DC | PRN
  Administered 2023-01-11: 50 mg via INTRAVENOUS
  Administered 2023-01-11: 20 mg via INTRAVENOUS

## 2023-01-11 MED ORDER — PANTOPRAZOLE SODIUM 40 MG PO TBEC
40.0000 mg | DELAYED_RELEASE_TABLET | Freq: Every day | ORAL | Status: DC
  Administered 2023-01-12: 40 mg via ORAL
  Filled 2023-01-11: qty 1

## 2023-01-11 MED ORDER — TRAMADOL HCL 50 MG PO TABS
25.0000 mg | ORAL_TABLET | Freq: Four times a day (QID) | ORAL | Status: DC | PRN
  Administered 2023-01-12: 50 mg via ORAL
  Filled 2023-01-11: qty 1

## 2023-01-11 MED ORDER — ALBUTEROL SULFATE (2.5 MG/3ML) 0.083% IN NEBU: 3.0000 mL | INHALATION_SOLUTION | Freq: Four times a day (QID) | RESPIRATORY_TRACT | Status: DC | PRN

## 2023-01-11 MED ORDER — ORAL CARE MOUTH RINSE: 15.0000 mL | Freq: Once | OROMUCOSAL | Status: AC

## 2023-01-11 MED ORDER — LIDOCAINE 2% (20 MG/ML) 5 ML SYRINGE
INTRAMUSCULAR | Status: AC
  Filled 2023-01-11: qty 5

## 2023-01-11 MED ORDER — THROMBIN (RECOMBINANT) 5000 UNITS EX SOLR
CUTANEOUS | Status: DC | PRN
  Administered 2023-01-11: 10 mL via TOPICAL

## 2023-01-11 MED ORDER — ACETAMINOPHEN 500 MG PO TABS
1000.0000 mg | ORAL_TABLET | Freq: Once | ORAL | Status: AC
  Administered 2023-01-11: 1000 mg via ORAL
  Filled 2023-01-11: qty 2

## 2023-01-11 MED ORDER — POTASSIUM CHLORIDE ER 10 MEQ PO TBCR
10.0000 meq | EXTENDED_RELEASE_TABLET | Freq: Every day | ORAL | Status: DC
  Administered 2023-01-12: 10 meq via ORAL
  Filled 2023-01-11 (×4): qty 1

## 2023-01-11 MED ORDER — SODIUM CHLORIDE 0.9 % IV SOLN
250.0000 mL | INTRAVENOUS | Status: DC
  Administered 2023-01-11: 250 mL via INTRAVENOUS

## 2023-01-11 MED ORDER — PROPOFOL 10 MG/ML IV BOLUS
INTRAVENOUS | Status: DC | PRN
  Administered 2023-01-11 (×2): 125 ug/kg/min via INTRAVENOUS
  Administered 2023-01-11: 150 mg via INTRAVENOUS

## 2023-01-11 MED ORDER — AMISULPRIDE (ANTIEMETIC) 5 MG/2ML IV SOLN: 10.0000 mg | Freq: Once | INTRAVENOUS | Status: DC | PRN

## 2023-01-11 MED ORDER — MENTHOL 3 MG MT LOZG: 1.0000 | LOZENGE | OROMUCOSAL | Status: DC | PRN

## 2023-01-11 MED ORDER — ACETAMINOPHEN 325 MG PO TABS
650.0000 mg | ORAL_TABLET | ORAL | Status: DC | PRN
  Administered 2023-01-11: 650 mg via ORAL

## 2023-01-11 MED ORDER — FENTANYL CITRATE (PF) 100 MCG/2ML IJ SOLN
INTRAMUSCULAR | Status: AC
  Filled 2023-01-11: qty 2

## 2023-01-11 MED ORDER — HYDROMORPHONE HCL 1 MG/ML IJ SOLN: 1.0000 mg | INTRAMUSCULAR | Status: DC | PRN

## 2023-01-11 MED ORDER — EZETIMIBE 10 MG PO TABS
10.0000 mg | ORAL_TABLET | Freq: Every day | ORAL | Status: DC
  Administered 2023-01-12: 10 mg via ORAL
  Filled 2023-01-11: qty 1

## 2023-01-11 MED ORDER — SODIUM CHLORIDE 0.9% FLUSH: 3.0000 mL | INTRAVENOUS | Status: DC | PRN

## 2023-01-11 MED ORDER — CHLORHEXIDINE GLUCONATE 0.12 % MT SOLN
15.0000 mL | Freq: Once | OROMUCOSAL | Status: AC
  Administered 2023-01-11: 15 mL via OROMUCOSAL
  Filled 2023-01-11: qty 15

## 2023-01-11 MED ORDER — ROSUVASTATIN CALCIUM 20 MG PO TABS
20.0000 mg | ORAL_TABLET | Freq: Every day | ORAL | Status: DC
  Administered 2023-01-11 – 2023-01-12 (×2): 20 mg via ORAL
  Filled 2023-01-11 (×2): qty 1

## 2023-01-11 MED ORDER — ACETAMINOPHEN 650 MG RE SUPP: 650.0000 mg | RECTAL | Status: DC | PRN

## 2023-01-11 MED ORDER — ONDANSETRON HCL 4 MG PO TABS: 4.0000 mg | ORAL_TABLET | Freq: Four times a day (QID) | ORAL | Status: DC | PRN

## 2023-01-11 SURGICAL SUPPLY — 56 items
APL SKNCLS STERI-STRIP NONHPOA (GAUZE/BANDAGES/DRESSINGS) ×1
BAG COUNTER SPONGE SURGICOUNT (BAG) ×1 IMPLANT
BAG DECANTER FOR FLEXI CONT (MISCELLANEOUS) ×1 IMPLANT
BAG SPNG CNTER NS LX DISP (BAG) ×2
BENZOIN TINCTURE PRP APPL 2/3 (GAUZE/BANDAGES/DRESSINGS) ×1 IMPLANT
BIT DRILL 13 (BIT) IMPLANT
BUR MATCHSTICK NEURO 3.0 LAGG (BURR) ×1 IMPLANT
CAGE PEEK 6X14X11 (Cage) ×1 IMPLANT
CAGE PEEK 7X14X11 (Cage) ×1 IMPLANT
CANISTER SUCT 3000ML PPV (MISCELLANEOUS) ×1 IMPLANT
DRAPE C-ARM 42X72 X-RAY (DRAPES) ×2 IMPLANT
DRAPE LAPAROTOMY 100X72 PEDS (DRAPES) ×1 IMPLANT
DRAPE MICROSCOPE SLANT 54X150 (MISCELLANEOUS) ×1 IMPLANT
DURAPREP 6ML APPLICATOR 50/CS (WOUND CARE) ×1 IMPLANT
ELECT COATED BLADE 2.86 ST (ELECTRODE) ×1 IMPLANT
ELECT REM PT RETURN 9FT ADLT (ELECTROSURGICAL) ×1
ELECTRODE REM PT RTRN 9FT ADLT (ELECTROSURGICAL) ×1 IMPLANT
GAUZE 4X4 16PLY ~~LOC~~+RFID DBL (SPONGE) IMPLANT
GAUZE SPONGE 4X4 12PLY STRL (GAUZE/BANDAGES/DRESSINGS) ×1 IMPLANT
GLOVE ECLIPSE 9.0 STRL (GLOVE) ×1 IMPLANT
GLOVE EXAM NITRILE XL STR (GLOVE) IMPLANT
GOWN STRL REUS W/ TWL LRG LVL3 (GOWN DISPOSABLE) IMPLANT
GOWN STRL REUS W/ TWL XL LVL3 (GOWN DISPOSABLE) ×1 IMPLANT
GOWN STRL REUS W/TWL 2XL LVL3 (GOWN DISPOSABLE) IMPLANT
GOWN STRL REUS W/TWL LRG LVL3 (GOWN DISPOSABLE)
GOWN STRL REUS W/TWL XL LVL3 (GOWN DISPOSABLE) ×1
HALTER HD/CHIN CERV TRACTION D (MISCELLANEOUS) ×1 IMPLANT
HEMOSTAT POWDER KIT SURGIFOAM (HEMOSTASIS) IMPLANT
HEMOSTAT SURGICEL 2X14 (HEMOSTASIS) IMPLANT
KIT BASIN OR (CUSTOM PROCEDURE TRAY) ×1 IMPLANT
KIT TURNOVER KIT B (KITS) ×1 IMPLANT
NDL SPNL 20GX3.5 QUINCKE YW (NEEDLE) ×1 IMPLANT
NEEDLE SPNL 20GX3.5 QUINCKE YW (NEEDLE) ×1 IMPLANT
NS IRRIG 1000ML POUR BTL (IV SOLUTION) ×1 IMPLANT
PACK LAMINECTOMY NEURO (CUSTOM PROCEDURE TRAY) ×1 IMPLANT
PAD ARMBOARD 7.5X6 YLW CONV (MISCELLANEOUS) ×3 IMPLANT
PLATE VISION ELITE 40MM (Plate) IMPLANT
SCREW ST 13X4XST VA NS SPNE (Screw) IMPLANT
SCREW ST VAR 4 ATL (Screw) ×6 IMPLANT
SOL ELECTROSURG ANTI STICK (MISCELLANEOUS) ×1
SOLUTION ELECTROSURG ANTI STCK (MISCELLANEOUS) ×1 IMPLANT
SPACER SPNL 11X14X6XPEEK CVD (Cage) IMPLANT
SPACER SPNL 11X14X7XPEEK CVD (Cage) IMPLANT
SPCR SPNL 11X14X6XPEEK CVD (Cage) ×1 IMPLANT
SPCR SPNL 11X14X7XPEEK CVD (Cage) ×1 IMPLANT
SPONGE INTESTINAL PEANUT (DISPOSABLE) ×1 IMPLANT
SPONGE SURGIFOAM ABS GEL SZ50 (HEMOSTASIS) ×1 IMPLANT
STRIP CLOSURE SKIN 1/2X4 (GAUZE/BANDAGES/DRESSINGS) ×1 IMPLANT
SUT VIC AB 3-0 SH 8-18 (SUTURE) ×1 IMPLANT
SUT VIC AB 4-0 RB1 18 (SUTURE) ×1 IMPLANT
TAPE CLOTH 4X10 WHT NS (GAUZE/BANDAGES/DRESSINGS) ×1 IMPLANT
TAPE CLOTH SURG 4X10 WHT LF (GAUZE/BANDAGES/DRESSINGS) IMPLANT
TOWEL GREEN STERILE (TOWEL DISPOSABLE) ×1 IMPLANT
TOWEL GREEN STERILE FF (TOWEL DISPOSABLE) ×1 IMPLANT
TRAP SPECIMEN MUCUS 40CC (MISCELLANEOUS) ×1 IMPLANT
WATER STERILE IRR 1000ML POUR (IV SOLUTION) ×1 IMPLANT

## 2023-01-11 NOTE — Anesthesia Procedure Notes (Addendum)
Procedure Name: Intubation Date/Time: 01/11/2023 2:47 PM  Performed by: Marny Lowenstein, CRNAPre-anesthesia Checklist: Patient identified, Emergency Drugs available, Suction available and Patient being monitored Patient Re-evaluated:Patient Re-evaluated prior to induction Oxygen Delivery Method: Circle system utilized Preoxygenation: Pre-oxygenation with 100% oxygen Induction Type: IV induction Ventilation: Mask ventilation without difficulty and Oral airway inserted - appropriate to patient size Laryngoscope Size: Glidescope Grade View: Grade I Tube type: Oral Tube size: 7.0 mm Number of attempts: 1 Airway Equipment and Method: Stylet and Oral airway Placement Confirmation: ETT inserted through vocal cords under direct vision, positive ETCO2 and breath sounds checked- equal and bilateral Secured at: 21 cm Tube secured with: Tape Dental Injury: Teeth and Oropharynx as per pre-operative assessment  Comments: Intubated by Charlann Noss, SRNA

## 2023-01-11 NOTE — Progress Notes (Signed)
Orthopedic Tech Progress Note Patient Details:  Catherine Green [birth year] [id number]  Ortho Devices Type of Ortho Device: Soft collar Ortho Device/Splint Location: NECK Ortho Device/Splint Interventions: Ordered, Application, Adjustment   Post Interventions Patient Tolerated: Well Instructions Provided: Care of device  [name] 01/11/2023, 4:35 PM

## 2023-01-11 NOTE — Transfer of Care (Signed)
Immediate Anesthesia Transfer of Care Note  Patient: Catherine Green  Procedure(s) Performed: Anterior Cervical Decompression/Discectomy Fusion Cervical Five-Cervical Six - Cervical Six-Cervical Seven  Patient Location: PACU  Anesthesia Type:General  Level of Consciousness: awake, alert , and oriented  Airway & Oxygen Therapy: Patient Spontanous Breathing and Patient connected to face mask oxygen  Post-op Assessment: Report given to RN, Post -op Vital signs reviewed and stable, and Patient moving all extremities X 4  Post vital signs: Reviewed and stable  Last Vitals:  Vitals Value Taken Time  BP 112/59 01/11/23 1615  Temp    Pulse 73 01/11/23 1620  Resp 22 01/11/23 1620  SpO2 98 % 01/11/23 1620  Vitals shown include unvalidated device data.  Last Pain:  Vitals:   01/11/23 1038  TempSrc:   PainSc: 0-No pain      Patients Stated Pain Goal: 0 (01/11/23 1038)  Complications: There were no known notable events for this encounter.

## 2023-01-11 NOTE — Progress Notes (Signed)
PHARMACIST - PHYSICIAN ORDER COMMUNICATION  CONCERNING: P&T Medication Policy on Herbal Medications  DESCRIPTION:  This patient's order for:  Co-Q  has been noted.  This product(s) is classified as an "herbal" or natural product. Due to a lack of definitive safety studies or FDA approval, nonstandard manufacturing practices, plus the potential risk of unknown drug-drug interactions while on inpatient medications, the Pharmacy and Therapeutics Committee does not permit the use of "herbal" or natural products of this type within [facility].   ACTION TAKEN: The pharmacy department is unable to verify this order at this time and your patient has been informed of this safety policy. Please reevaluate patient's clinical condition at discharge and address if the herbal or natural product(s) should be resumed at that time.   Loralee Pacas, PharmD, BCPS 01/11/2023 6:05 PM

## 2023-01-11 NOTE — Op Note (Signed)
Date of procedure: 01/11/2023  Date of dictation: Same  Service: Neurosurgery  Preoperative diagnosis: Cervical stenosis with myelopathy  Postoperative diagnosis: Same  Procedure Name: C5-6, C6-7 anterior cervical discectomy and fusion utilizing interbody cages, local harvested autograft, and anterior plate instrumentation.  Surgeon:Laurene Melendrez A.Shatana Saxton, M.D.  Asst. Surgeon: Doran Durand, NP  Assistant utilized for exposure, decompression, fusion, instrumentation, closure.  Anesthesia: General  Indication: 82 year old female with neck pain with bilateral upper extremity numbness paresthesias and some weakness as well as lower extremity gait instability.  Workup demonstrates evidence of a large right paracentral disc herniation at C5-6 with marked cord compression and cord signal abnormality.  She has a broad-based disc herniation and stenosis at C6-7 as well.  Patient presents now for two-level anterior cervical decompression and fusion in hopes improving her symptoms.  Operative note: After induction of anesthesia, patient position supine with neck slightly extended and held placeholder traction.  Patient's anterior cervical region prepped and draped sterilely.  Incision made overlying C6.  Dissection performed in the right carefully dissecting around her prior carotid endarterectomy.  Retractor placed.  Fluoroscopy used.  Levels confirmed.  Disc bases at C5-6 and C6-7 incised.  Discectomy was then performed using various instruments down to level the posterior annulus.  Microscope was then brought into the field used throughout the remainder of the discectomy.  Remaining aspects of annulus and osteophytes were removed using high-speed drill down to level the posterior logical limb.  Posterior logical midline was tightly adherent to underlying thecal sac.  This was carefully dissected free.  With a wide central decompression then performed undercutting the bodies of C5 and C6.  Decompression then proceeded  intraneural foramina.  Wide anterior foraminotomies performed on the course exiting C6 nerve roots bilaterally.  A large amount of subligamentous disc herniation was resected along the way.  At this point a very thorough decompression had been achieved.  There was no evidence of injury to the thecal sac or nerve roots.  Procedures then repeated at C6-7 in a similar fashion again without complication.  Wound was then irrigated.  Gelfoam was placed topically for hemostasis then removed.  [employer] anatomic peek cages were then packed with locally harvested autograft.  Each cage was then impacted in place and recessed slightly from the anterior cortical margin.  40 mm Atlantis anterior cervical plate was then placed over the C5, C6 and C7 levels.  This then attached under fluoroscopic guidance using 13 mm variable angle screws to each at all 3 levels.  Locking screws were engaged after final tightening of the screws.  Final images revealed good position cages and the hardware with proper proper level with normal alignment of spine.  Wound was then irrigated.  Hemostasis was achieved with bipolar cautery.  Wounds and closed in layers with Vicryl sutures.  Steri-Strips and sterile dressing were applied.  No apparent complications.  Patient tolerated the procedure well and she returns to the recovery room postop.

## 2023-01-11 NOTE — H&P (Signed)
Catherine Green is an 82 y.o. female.   Chief Complaint: Neck pain HPI: 82 year old female with increasing posterior cervical pain with radiation to her shoulders and arms bilaterally.  Patient with progressive numbness paresthesias and some weakness in both distal upper extremities.  Patient also notes some increasing gait instability.  Workup demonstrates evidence of significant cervical disc generation with broad-based central disc herniation at C5-6 and C6-7 with some moderate spinal cord compression and some early signal abnormality at the C5-6 level.  Patient presents now for two-level anterior cervical decompression and fusion surgery in hopes improving her symptoms.  Past Medical History:  Diagnosis Date   Allergy    hay fever   Anemia    Anxiety    Back pain    Carotid artery occlusion    Cerebrovascular disease    extracranial; occlusive   Chicken pox    Coronary artery disease    Depression    Dizziness    Dizziness    DVT (deep venous thrombosis) (HCC)    Fainting spell    GERD (gastroesophageal reflux disease)    Headache    Heart murmur    Hyperlipidemia    Hypertension    Leg pain    Mitral regurgitation    PONV (postoperative nausea and vomiting)    severe nausea and vomiting   Pre-syncope    PVD (peripheral vascular disease) (HCC)    endarterectomy by Dr. Arbie Cookey   Seasonal allergies    Shortness of breath dyspnea    wth ambulation at times   Swelling of both ankles    and abdomen; takes Lasix when needed   Thrombophlebitis    following childbirth   Ulcer     Past Surgical History:  Procedure Laterality Date   APPENDECTOMY     BREAST SURGERY     saline implants   CARDIAC CATHETERIZATION     CAROTID ENDARTERECTOMY  1992   CAROTID ENDARTERECTOMY Right 02/13/2005   Re-do Right CE   CATARACT EXTRACTION, BILATERAL  2013   CHOLECYSTECTOMY, LAPAROSCOPIC  04/02/2014   Dr. Renda Rolls   CORONARY ARTERY BYPASS GRAFT  2006   x3 Dr. Donata Clay    ENDARTERECTOMY Left 07/20/2014   Procedure: ENDARTERECTOMY CAROTID-LEFT;  Surgeon: Larina Earthly, MD;  Location: [facility] OR;  Service: Vascular;  Laterality: Left;   EYE SURGERY Bilateral 10/2011   Cataract Left eye   SEPTOPLASTY     with bilateral inferior turbinate reductions   SPINE SURGERY  12/2015   TOTAL ABDOMINAL HYSTERECTOMY     UPPER GASTROINTESTINAL ENDOSCOPY     had polyps removed from esophagus   UPPER GI ENDOSCOPY      Family History  Problem Relation Age of Onset   Aneurysm Mother        brain   Heart disease Mother        Aneyursm    Hyperlipidemia Mother    Hypertension Mother    Varicose Veins Mother    Bleeding Disorder Mother    Heart disease Father    Cirrhosis Father    Heart attack Father    Hyperlipidemia Father    Hypertension Father    Cancer Sister        lung   Heart disease Sister        Aneurysm   Hyperlipidemia Sister    Hypertension Sister    Varicose Veins Sister    Bleeding Disorder Sister    Heart disease Brother  Before age 49   Aneurysm Brother    Deep vein thrombosis Brother    Birth defects Brother    Hyperlipidemia Brother    Hypertension Brother    Peripheral vascular disease Daughter    Hyperlipidemia Daughter    Hypertension Daughter    Hypertension Other    Breast cancer Neg Hx    Colon cancer Neg Hx    Social History:  reports that she quit smoking about 26 years ago. Her smoking use included cigarettes. She has a 4.50 pack-year smoking history. She has never been exposed to tobacco smoke. She has never used smokeless tobacco. She reports that she does not drink alcohol and does not use drugs.  Allergies:  Allergies  Allergen Reactions   Lyrica [Pregabalin] Shortness Of Breath, Swelling and Other (See Comments)    chest tight   Hydrocodone Other (See Comments)    hallucinate   Ivp Dye [Iodinated Contrast Media] Hives and Itching        Lipitor [Atorvastatin] Nausea And Vomiting   Methylprednisolone Other (See  Comments)    Severe weekness    Oxycodone Hcl Other (See Comments)    Hallucinations    Penicillin G Nausea And Vomiting   Shellfish Allergy Hives and Itching   Buspar [Buspirone]     Worsening mood   Clavulanic Acid    Prednisone    Codeine Nausea And Vomiting and Other (See Comments)   Cymbalta [Duloxetine Hcl] Nausea Only    GI upset at 30mg    Demerol Nausea And Vomiting   Doxycycline Nausea Only and Other (See Comments)    Weakness, sick to her stomach   Effexor [Venlafaxine] Nausea Only   Hydrochlorothiazide Other (See Comments)    Not an allergy but urinary frequency and sweating at 25mg    Hydroxyzine Other (See Comments)    Excessive sweating.    Metoprolol Other (See Comments)    Slowed body down, per pt    Protonix [Pantoprazole Sodium] Nausea Only   Trazodone And Nefazodone Other (See Comments)    Sedation     Medications Prior to Admission  Medication Sig Dispense Refill   albuterol (VENTOLIN HFA) 108 (90 Base) MCG/ACT inhaler Inhale 2 puffs into the lungs every 6 (six) hours as needed for wheezing or shortness of breath. 8 g 3   amLODipine (NORVASC) 2.5 MG tablet Take 0.5 tablets (1.25 mg total) by mouth in the morning. 45 tablet 3   aspirin 81 MG tablet Take 1 tablet (81 mg total) by mouth daily. 30 tablet    Coenzyme Q10 (CO Q10) 100 MG CAPS Take 100 mg by mouth daily.     ezetimibe (ZETIA) 10 MG tablet Take 1 tablet (10 mg total) by mouth daily. 90 tablet 2   furosemide (LASIX) 20 MG tablet TAKE 1 TABLET BY MOUTH DAILY. 90 tablet 3   gabapentin (NEURONTIN) 400 MG capsule Take 1 capsule (400 mg total) by mouth 3 (three) times daily as needed. (Patient taking differently: Take 400 mg by mouth 3 (three) times daily.) 90 capsule 5   loratadine (CLARITIN) 10 MG tablet Take 10 mg by mouth daily.     metoprolol succinate (TOPROL-XL) 25 MG 24 hr tablet Take 12.5 mg by mouth at bedtime.     omeprazole (PRILOSEC) 20 MG capsule Take 1 capsule (20 mg total) by mouth 2 (two)  times daily before a meal.     polyethylene glycol powder (GLYCOLAX/MIRALAX) 17 GM/SCOOP powder Take 17 g by mouth 2 (two) times daily  as needed. 850 g 0   potassium chloride (KLOR-CON) 10 MEQ tablet Take 1 tablet (10 mEq total) by mouth daily. 90 tablet 2   rosuvastatin (CRESTOR) 20 MG tablet Take 1 tablet (20 mg total) by mouth daily. 90 tablet 2   sertraline (ZOLOFT) 100 MG tablet TAKE ONE AND A HALF TABLETS BY MOUTH EVERY DAY 150 tablet 2   vitamin B-12 (CYANOCOBALAMIN) 1000 MCG tablet Take 1 tablet (1,000 mcg total) by mouth daily.      No results found for this or any previous visit (from the past 48 hour(s)). No results found.  Pertinent items noted in HPI and remainder of comprehensive ROS otherwise negative.  Blood pressure (!) 174/67, pulse 73, temperature 97.8 F (36.6 C), temperature source Oral, resp. rate 18, height 5\' 2"  (1.575 m), weight 64 kg, SpO2 93 %.  Patient is awake and alert.  She is oriented and appropriate.  Speech is fluent.  Judgment insight are intact.  Cranial nerve function normal bilateral.  Motor examination reveals some mild weakness of grip and intrinsic strength bilateral.  Sensory examination with patchy distal sensory loss in both upper extremities.  Gait is unsteady.  Examination head ears eyes nose throat is unremarked.  Chest and abdomen are benign.  Extremities are free of major deformity. Assessment/Plan There is no C5-6, C6-7 stenosis with myelopathy.  Plan C5-6, C6-7 anterior cervical discectomy and fusion utilizing interbody cages, local harvested autograft, and anterior plate instrumentation.  Risks and benefits been explained.  Patient wishes to proceed.  Kathaleen Maser Catherine Green 01/11/2023, 12:51 PM

## 2023-01-11 NOTE — Brief Op Note (Signed)
01/11/2023  3:53 PM  PATIENT:  Catherine Green  82 y.o. female  PRE-OPERATIVE DIAGNOSIS:  Stenosis  POST-OPERATIVE DIAGNOSIS:  Stenosis  PROCEDURE:  Procedure(s) with comments: Anterior Cervical Decompression/Discectomy Fusion Cervical Five-Cervical Six - Cervical Six-Cervical Seven (N/A) - 3C  SURGEON:  Surgeon(s) and Role:    * Julio Sicks, MD - Primary  PHYSICIAN ASSISTANT:   ASSISTANTSMarland Mcalpine   ANESTHESIA:   general  EBL:  300 mL   BLOOD ADMINISTERED:none  DRAINS: none   LOCAL MEDICATIONS USED:  NONE  SPECIMEN:  No Specimen  DISPOSITION OF SPECIMEN:  N/A  COUNTS:  YES  TOURNIQUET:  * No tourniquets in log *  DICTATION: .Dragon Dictation  PLAN OF CARE: Admit for overnight observation  PATIENT DISPOSITION:  PACU - hemodynamically stable.   Delay start of Pharmacological VTE agent (>24hrs) due to surgical blood loss or risk of bleeding: yes

## 2023-01-12 ENCOUNTER — Encounter (HOSPITAL_COMMUNITY): Payer: Self-pay | Admitting: Neurosurgery

## 2023-01-12 DIAGNOSIS — I10 Essential (primary) hypertension: Secondary | ICD-10-CM | POA: Diagnosis not present

## 2023-01-12 DIAGNOSIS — Z8673 Personal history of transient ischemic attack (TIA), and cerebral infarction without residual deficits: Secondary | ICD-10-CM | POA: Diagnosis not present

## 2023-01-12 DIAGNOSIS — M4722 Other spondylosis with radiculopathy, cervical region: Secondary | ICD-10-CM | POA: Diagnosis not present

## 2023-01-12 DIAGNOSIS — M4802 Spinal stenosis, cervical region: Secondary | ICD-10-CM | POA: Diagnosis not present

## 2023-01-12 DIAGNOSIS — Z951 Presence of aortocoronary bypass graft: Secondary | ICD-10-CM | POA: Diagnosis not present

## 2023-01-12 DIAGNOSIS — Z87891 Personal history of nicotine dependence: Secondary | ICD-10-CM | POA: Diagnosis not present

## 2023-01-12 DIAGNOSIS — M4712 Other spondylosis with myelopathy, cervical region: Secondary | ICD-10-CM | POA: Diagnosis not present

## 2023-01-12 DIAGNOSIS — I2510 Atherosclerotic heart disease of native coronary artery without angina pectoris: Secondary | ICD-10-CM | POA: Diagnosis not present

## 2023-01-12 DIAGNOSIS — Z86718 Personal history of other venous thrombosis and embolism: Secondary | ICD-10-CM | POA: Diagnosis not present

## 2023-01-12 DIAGNOSIS — Z7982 Long term (current) use of aspirin: Secondary | ICD-10-CM | POA: Diagnosis not present

## 2023-01-12 DIAGNOSIS — Z79899 Other long term (current) drug therapy: Secondary | ICD-10-CM | POA: Diagnosis not present

## 2023-01-12 MED ORDER — CYCLOBENZAPRINE HCL 5 MG PO TABS: 5.0000 mg | ORAL_TABLET | Freq: Three times a day (TID) | ORAL | 0 refills | Status: DC | PRN

## 2023-01-12 MED ORDER — TRAMADOL HCL 50 MG PO TABS: 25.0000 mg | ORAL_TABLET | Freq: Four times a day (QID) | ORAL | 1 refills | Status: DC | PRN

## 2023-01-12 MED FILL — Thrombin For Soln 5000 Unit: CUTANEOUS | Qty: 2 | Status: AC

## 2023-01-12 NOTE — Discharge Instructions (Signed)

## 2023-01-12 NOTE — Discharge Summary (Signed)
Physician Discharge Summary  Patient ID: Catherine Green MRN: [id number] DOB/AGE: 1941/05/05 82 y.o.  Admit date: 01/11/2023 Discharge date: 01/12/2023  Admission Diagnoses:  Discharge Diagnoses:  Principal Problem:   Cervical spondylosis with myelopathy and radiculopathy   Discharged Condition: good  Hospital Course: Patient admitted to the hospital where she underwent two-level anterior cervical decompression and fusion.  Postoperatively doing well.  Preoperative neck pain and radiating pain numbness and weakness all much improved.  Standing ambulating and voiding.  Ready for discharge home.  Consults:   Significant Diagnostic Studies:   Treatments:   Discharge Exam: Blood pressure (!) 144/48, pulse 70, temperature 98.3 F (36.8 C), temperature source Oral, resp. rate 18, height 5\' 2"  (1.575 m), weight 64 kg, SpO2 90 %. Awake and alert.  Oriented and appropriate.  Motor examination intact motor and sensory examination with some mild numbness in both distal hands but this is improved from preop.  Wound healing well.  Neck soft.  Voice strong.  Swallowing well.  Chest and abdomen benign.  Disposition: Discharge disposition: 01-Home or Self Care        Allergies as of 01/12/2023       Reactions   Lyrica [pregabalin] Shortness Of Breath, Swelling, Other (See Comments)   chest tight   Hydrocodone Other (See Comments)   hallucinate   Ivp Dye [iodinated Contrast Media] Hives, Itching       Lipitor [atorvastatin] Nausea And Vomiting   Methylprednisolone Other (See Comments)   Severe weekness    Oxycodone Hcl Other (See Comments)   Hallucinations    Penicillin G Nausea And Vomiting   Shellfish Allergy Hives, Itching   Buspar [buspirone]    Worsening mood   Clavulanic Acid    Prednisone    Codeine Nausea And Vomiting, Other (See Comments)   Cymbalta [duloxetine Hcl] Nausea Only   GI upset at 30mg    Demerol Nausea And Vomiting   Doxycycline Nausea Only, Other (See  Comments)   Weakness, sick to her stomach   Effexor [venlafaxine] Nausea Only   Hydrochlorothiazide Other (See Comments)   Not an allergy but urinary frequency and sweating at 25mg    Hydroxyzine Other (See Comments)   Excessive sweating.    Metoprolol Other (See Comments)   Slowed body down, per pt   Protonix [pantoprazole Sodium] Nausea Only   Trazodone And Nefazodone Other (See Comments)   Sedation         Medication List     TAKE these medications    albuterol 108 (90 Base) MCG/ACT inhaler Commonly known as: VENTOLIN HFA Inhale 2 puffs into the lungs every 6 (six) hours as needed for wheezing or shortness of breath.   amLODipine 2.5 MG tablet Commonly known as: NORVASC Take 0.5 tablets (1.25 mg total) by mouth in the morning.   aspirin 81 MG tablet Take 1 tablet (81 mg total) by mouth daily.   Co Q10 100 MG Caps Take 100 mg by mouth daily.   cyanocobalamin 1000 MCG tablet Commonly known as: VITAMIN B12 Take 1 tablet (1,000 mcg total) by mouth daily.   cyclobenzaprine 5 MG tablet Commonly known as: FLEXERIL Take 1-2 tablets (5-10 mg total) by mouth 3 (three) times daily as needed for muscle spasms.   ezetimibe 10 MG tablet Commonly known as: ZETIA Take 1 tablet (10 mg total) by mouth daily.   furosemide 20 MG tablet Commonly known as: LASIX TAKE 1 TABLET BY MOUTH DAILY.   gabapentin 400 MG capsule Commonly known as: NEURONTIN  Take 1 capsule (400 mg total) by mouth 3 (three) times daily as needed. What changed: when to take this   loratadine 10 MG tablet Commonly known as: CLARITIN Take 10 mg by mouth daily.   metoprolol succinate 25 MG 24 hr tablet Commonly known as: TOPROL-XL Take 12.5 mg by mouth at bedtime.   omeprazole 20 MG capsule Commonly known as: PRILOSEC Take 1 capsule (20 mg total) by mouth 2 (two) times daily before a meal.   polyethylene glycol powder 17 GM/SCOOP powder Commonly known as: GLYCOLAX/MIRALAX Take 17 g by mouth 2 (two)  times daily as needed.   potassium chloride 10 MEQ tablet Commonly known as: KLOR-CON Take 1 tablet (10 mEq total) by mouth daily.   rosuvastatin 20 MG tablet Commonly known as: CRESTOR Take 1 tablet (20 mg total) by mouth daily.   sertraline 100 MG tablet Commonly known as: ZOLOFT TAKE ONE AND A HALF TABLETS BY MOUTH EVERY DAY   traMADol 50 MG tablet Commonly known as: ULTRAM Take 0.5-1 tablets (25-50 mg total) by mouth every 6 (six) hours as needed for moderate pain.         Signed: Kathaleen Maser Asahel Risden 01/12/2023, 9:50 AM

## 2023-01-12 NOTE — Anesthesia Postprocedure Evaluation (Signed)
Anesthesia Post Note  Patient: Catherine Green  Procedure(s) Performed: Anterior Cervical Decompression/Discectomy Fusion Cervical Five-Cervical Six - Cervical Six-Cervical Seven     Patient location during evaluation: PACU Anesthesia Type: General Level of consciousness: awake and alert Pain management: pain level controlled Vital Signs Assessment: post-procedure vital signs reviewed and stable Respiratory status: spontaneous breathing, nonlabored ventilation, respiratory function stable and patient connected to nasal cannula oxygen Cardiovascular status: blood pressure returned to baseline and stable Postop Assessment: no apparent nausea or vomiting Anesthetic complications: no   There were no known notable events for this encounter.  Last Vitals:  Vitals:   01/12/23 0327 01/12/23 0714  BP: (!) 140/52 (!) 144/48  Pulse: 64 70  Resp: 18 18  Temp: 36.7 C 36.8 C  SpO2: 98% 90%    Last Pain:  Vitals:   01/12/23 0830  TempSrc:   PainSc: 5                  Prince Couey P Ladarian Bonczek

## 2023-01-12 NOTE — Evaluation (Signed)
Occupational Therapy Evaluation Patient Details Name: Catherine Green MRN: [id number] DOB: 06-08-1941 Today's Date: 01/12/2023   History of Present Illness 82 yo female s/p 5/13 C5-6 C6-7 ACDF utilizing interbody cages PMH anxiety back pain CAD HLD HTN PVD CABG x2 back surg   Clinical Impression   Patient is s/p ACDF C5-6 C6-7 surgery resulting in functional limitations due to the deficits listed below (see OT problem list). Pt at baseline indep and drives. Pt at this time unsteady with sit<>stand transfers with RW used. Pt reports balance deficits at baseline and spouse ability to (A). Pt could benefit in the future from outpatient for balance and LB strengthening.  Patient will benefit from skilled OT acutely to increase independence and safety with ADLS to allow discharge home with family (A) .       Recommendations for follow up therapy are one component of a multi-disciplinary discharge planning process, led by the attending physician.  Recommendations may be updated based on patient status, additional functional criteria and insurance authorization.   Assistance Recommended at Discharge PRN  Patient can return home with the following A little help with walking and/or transfers;Assist for transportation    Functional Status Assessment  Patient has had a recent decline in their functional status and demonstrates the ability to make significant improvements in function in a reasonable and predictable amount of time.  Equipment Recommendations  None recommended by OT (has DME rollator)    Recommendations for Other Services       Precautions / Restrictions Precautions Precautions: Cervical Precaution Comments: handout provided and reviewed for adls. pt hoh so having pt repeat information to ensure understanding Required Braces or Orthoses: Cervical Brace Cervical Brace: Soft collar;At all times Restrictions Weight Bearing Restrictions: No      Mobility Bed Mobility Overal bed  mobility: Needs Assistance Bed Mobility: Supine to Sit, Sit to Supine     Supine to sit: Supervision Sit to supine: Supervision   General bed mobility comments: pt with cues for sequence but able to complete without physical (A)    Transfers Overall transfer level: Needs assistance Equipment used: Rolling walker (2 wheels) Transfers: Sit to/from Stand Sit to Stand: Min guard, From elevated surface           General transfer comment: pt would require physical (A) from lower surface. pt reports "this isnt new. this is how its been for a while"      Balance Overall balance assessment: Mild deficits observed, not formally tested                                         ADL either performed or assessed with clinical judgement   ADL Overall ADL's : Needs assistance/impaired Eating/Feeding: Independent   Grooming: Independent   Upper Body Bathing: Min guard   Lower Body Bathing: Min guard   Upper Body Dressing : Min guard Upper Body Dressing Details (indicate cue type and reason): max (A) to button shirt. mod (A) do don doff ccollar in bathroom standing. recommend seated position. Lower Body Dressing: Sit to/from stand Lower Body Dressing Details (indicate cue type and reason): cued to sit to decrease fall risk. pt able to figure 4 cross Toilet Transfer: Chemical engineer (2 wheels) Toilet Transfer Details (indicate cue type and reason): cues for keeping RW close and not abandoning the RW. pt pushing it out of the way initially  and says i dont need this in there Toileting- Architect and Hygiene: Min guard       Functional mobility during ADLs: Rolling walker (2 wheels);Min guard General ADL Comments: pt with elevated surface to help with sit<>Stand and decreased fall risk. pt reports spouse can help.   Cervical precautions ( handout provided): Educated patient on don doff brace with return demonstration, educated on oral  care using cups, washing face with cloth, never to wash directly on incision site, avoid neck rotation flexion and extension, positioning with pillows in chair for bil UE, sleeping positioning, avoiding pushing / pulling with bil UE, a Pt educated on need to notify doctor / RN of swallowing changes or choking..    Vision Baseline Vision/History: 0 No visual deficits Ability to See in Adequate Light: 0 Adequate Patient Visual Report: No change from baseline       Perception     Praxis      Pertinent Vitals/Pain Pain Assessment Pain Assessment: Faces Faces Pain Scale: Hurts little more Pain Location: neck Pain Descriptors / Indicators: Discomfort, Grimacing Pain Intervention(s): Limited activity within patient's tolerance, Premedicated before session, Repositioned     Hand Dominance Right   Extremity/Trunk Assessment Upper Extremity Assessment Upper Extremity Assessment: Overall WFL for tasks assessed (pt with fake nails and unable to button shirts. pt reports "its these nails" pt states " always wear pull over shirts and he told me not to and i can't get these buttons now with these nails")   Lower Extremity Assessment Lower Extremity Assessment: Generalized weakness (pt with noticeable decreased power up from surface and weakness in bil quads)   Cervical / Trunk Assessment Cervical / Trunk Assessment: Neck Surgery   Communication Communication Communication: HOH   Cognition Arousal/Alertness: Awake/alert Behavior During Therapy: WFL for tasks assessed/performed Overall Cognitive Status: Within Functional Limits for tasks assessed                                       General Comments  dressing dry and intact    Exercises     Shoulder Instructions      Home Living Family/patient expects to be discharged to:: Private residence Living Arrangements: Spouse/significant other Available Help at Discharge: Family;Available 24 hours/day Type of Home:  House Home Access: Level entry     Home Layout: One level     Bathroom Shower/Tub: Producer, television/film/video: Handicapped height     Home Equipment: Rollator (4 wheels);Hand held shower head;Grab bars - toilet;Shower seat - built in Librarian, academic but not a lift chair)   Additional Comments: reports sisters, neighbors and best friend can all help her      Prior Functioning/Environment Prior Level of Function : Driving;Independent/Modified Independent             Mobility Comments: reports "my balance hasnt been very good"          OT Problem List: Decreased strength;Decreased activity tolerance;Impaired balance (sitting and/or standing);Decreased safety awareness;Decreased knowledge of use of DME or AE;Decreased knowledge of precautions      OT Treatment/Interventions: Self-care/ADL training;Therapeutic exercise;Neuromuscular education;Energy conservation;DME and/or AE instruction;Manual therapy;Modalities;Therapeutic activities;Patient/family education;Balance training    OT Goals(Current goals can be found in the care plan section) Acute Rehab OT Goals Patient Stated Goal: to go home OT Goal Formulation: With patient Time For Goal Achievement: 01/26/23 Potential to Achieve Goals: Good  OT Frequency: Min  2X/week    Co-evaluation              AM-PAC OT "6 Clicks" Daily Activity     Outcome Measure Help from another person eating meals?: None Help from another person taking care of personal grooming?: None Help from another person toileting, which includes using toliet, bedpan, or urinal?: A Little Help from another person bathing (including washing, rinsing, drying)?: A Little Help from another person to put on and taking off regular upper body clothing?: None Help from another person to put on and taking off regular lower body clothing?: None 6 Click Score: 22   End of Session Equipment Utilized During Treatment: Gait belt;Rolling walker (2  wheels);Cervical collar Nurse Communication: Mobility status;Precautions  Activity Tolerance: Patient tolerated treatment well Patient left: in bed;with call bell/phone within reach  OT Visit Diagnosis: Unsteadiness on feet (R26.81);Muscle weakness (generalized) (M62.81)                Time: 1610-9604 OT Time Calculation (min): 25 min Charges:  OT General Charges $OT Visit: 1 Visit OT Evaluation $OT Eval Moderate Complexity: 1 Mod   Brynn, OTR/L  Acute Rehabilitation Services Office: [phone] .   Mateo Flow 01/12/2023, 10:20 AM

## 2023-01-12 NOTE — Plan of Care (Signed)
Pt and husband given D/C instructions with verbal understanding. Rx's were sent to the pharmacy by MD. Pt's incision is clean and dry with no sign of infection. Pt's IV was removed prior to D/C. Pt D/C'd home via wheelchair per MD order. Pt is stable @ D/C and has no other needs at this time. Soyla Bainter, RN 

## 2023-01-28 ENCOUNTER — Ambulatory Visit (INDEPENDENT_AMBULATORY_CARE_PROVIDER_SITE_OTHER): Payer: Medicare HMO

## 2023-01-28 ENCOUNTER — Telehealth: Payer: Medicare HMO

## 2023-01-28 DIAGNOSIS — I10 Essential (primary) hypertension: Secondary | ICD-10-CM

## 2023-01-28 DIAGNOSIS — I5033 Acute on chronic diastolic (congestive) heart failure: Secondary | ICD-10-CM

## 2023-01-28 NOTE — Chronic Care Management (AMB) (Signed)
Chronic Care Management   CCM RN Visit Note  01/28/2023 Name: Catherine Green MRN: [id number] DOB: 02/26/1941  Subjective: Catherine Green is a 82 y.o. year old female who is a primary care patient of Joaquim Nam, MD. The patient was referred to the Chronic Care Management team for assistance with care management needs subsequent to provider initiation of CCM services and plan of care.    Today's Visit:  Engaged with patient by telephone for follow up visit.        Goals Addressed             This Visit's Progress    CCM Expected Outcome:  Monitor, Self-Manage and Reduce Symptoms of Heart Failure       Current Barriers:  Knowledge Deficits related to the importance of daily weights in the effective management of HF Care Coordination needs related to [facility] and resources needed for effective medication management  in a patient with HF Chronic Disease Management support and education needs related to effective management of HF Wt Readings from Last 3 Encounters:  01/11/23 141 lb (64 kg)  01/08/23 141 lb 14.4 oz (64.4 kg)  12/16/22 141 lb 9.6 oz (64.2 kg)     Planned Interventions: Basic overview and discussion of pathophysiology of Heart Failure reviewed. The patient is stable. Denies any new issues with her heart failure or heart health. Follows up next month with her cardiologist. She is post surgery on her neck/back and healing well. She states the pain is minimal compared to what it was. She is thankful for the surgery being successful. She only was in the hospital overnight. Review of surgical incision and she states everything is healing well. Will continue to monitor for changes.  Provided education on low sodium diet. The patient states compliance with dietary restrictions Reviewed Heart Failure Action Plan in depth. The patient monitors her weight and is around 141 now. She says that she is not having any exacerbations in  her heart failure at this time. Sees  specialist on a regular basis. No changes in the plan of care.   Assessed need for readable accurate scales in home. Has scales in her home Provided education about placing scale on hard, flat surface Advised patient to weigh each morning after emptying bladder Discussed importance of daily weight and advised patient to weigh and record daily. The patient states she does not weigh daily. The patient states that she weighs a couple of times a week. Education on the benefits of daily weights and monitoring for changes in her swelling/edema and monitoring for heart failure exacerbations. Ask the patient to call the office for +2/3 pounds in one day or +5 pounds in one week. Will continue to monitor Review of sx and sx of heart failure and ask the patient to monitor for acute changes or onset:  F- Fatigue  A- Activity intolerance  C- Chest congestion or cough  E- Edema or Swelling in feet, legs, abdomen  S- Shortness of breath Reviewed role of diuretics in prevention of fluid overload and management of heart failure. The patient takes Lasix 20 mg QD for effective management of fluid balance Discussed the importance of keeping all appointments with provider. Keeps appointments. Has upcoming appointment with the cardiologist Dr. Loleta Chance.  Provided patient with education about the role of exercise in the management of heart failure Advised patient to discuss changes in HF, acute onset of swelling or edema noted, and other sx and sx of HF with  provider Screening for signs and symptoms of depression related to chronic disease state  Assessed social determinant of health barriers EF% 60 to 65% 09-15-2021  Symptom Management: Take medications as prescribed   Attend all scheduled provider appointments Call provider office for new concerns or questions  call the Suicide and Crisis Lifeline: 988 call the [place] National Suicide Prevention Lifeline: 4757584411 or TTY: (239) 861-4039 TTY (586) 436-6272) to talk  to a trained counselor call 1-800-273-TALK (toll free, 24 hour hotline) if experiencing a Mental Health or Behavioral Health Crisis  call office if I gain more than 2 pounds in one day or 5 pounds in one week use salt in moderation watch for swelling in feet, ankles and legs every day weigh myself daily develop a rescue plan follow rescue plan if symptoms flare-up eat more whole grains, fruits and vegetables, lean meats and healthy fats track symptoms and what helps feel better or worse dress right for the weather, hot or cold  Follow Up Plan: Telephone follow up appointment with care management team member scheduled for: 03-31-2023 at 1030 am       CCM Expected Outcome:  Monitor, Self-Manage, and Reduce Symptoms of Hypertension       Current Barriers:  Knowledge Deficits related to consistent measurement of blood pressures and to report blood pressures that are consistently out of range Chronic Disease Management support and education needs related to effective management of HTN BP Readings from Last 3 Encounters:  01/12/23 (!) 144/48  01/08/23 (!) 162/44  12/16/22 (!) 153/72     Planned Interventions: Evaluation of current treatment plan related to hypertension self management and patient's adherence to plan as established by provider. The patient has a cardiologist that she sees on a regular basis. States that her blood pressures are more stable post surgery on her neck. The patient is home after surgery on 01-11-2023 with an overnight stay. She feels she is stable and doing well. Denies any acute changes in her HTN or heart health. Provided education to patient re: stroke prevention, s/s of heart attack and stroke; Reviewed prescribed diet heart healthy diet. Review and education provided. The patient watches her sodium content. Says that she has an appetite sometimes and sometimes she does not. She denies any issues with dietary restrictions. Reviewed medications with patient and  discussed importance of compliance. The patient is compliant with medications and has worked with pharm D in the past. Ongoing support and medication management with the pharm D. Counseled on the importance of exercise goals with target of 150 minutes per week Discussed plans with patient for ongoing care management follow up and provided patient with direct contact information for care management team; Advised patient, providing education and rationale, to monitor blood pressure daily and record, calling PCP for findings outside established parameters. The patient does take her blood pressures at home. No readings available at the time of the call. Discussed the goals of blood pressures: <150 systolic and <90 diastolic;  Reviewed scheduled/upcoming provider appointments including: Sees pcp when needed. Has upcoming appointments with Dr. Loleta Chance and Dr. Jordan Likes in June. Advised patient to discuss changes in blood pressures or heart health with provider; Provided education on prescribed diet heart healthy diet ;  Discussed complications of poorly controlled blood pressure such as heart disease, stroke, circulatory complications, vision complications, kidney impairment, sexual dysfunction;  Screening for signs and symptoms of depression related to chronic disease state;  Assessed social determinant of health barriers;   Symptom Management: Take medications as prescribed  Attend all scheduled provider appointments Call provider office for new concerns or questions  call the Suicide and Crisis Lifeline: 988 call the [place] National Suicide Prevention Lifeline: (319)800-9681 or TTY: 212-349-0428 TTY (806)422-9685) to talk to a trained counselor call 1-800-273-TALK (toll free, 24 hour hotline) if experiencing a Mental Health or Behavioral Health Crisis  check blood pressure 3 times per week learn about high blood pressure take blood pressure log to all doctor appointments call doctor for signs and symptoms  of high blood pressure develop an action plan for high blood pressure keep all doctor appointments take medications for blood pressure exactly as prescribed report new symptoms to your doctor eat more whole grains, fruits and vegetables, lean meats and healthy fats  Follow Up Plan: Telephone follow up appointment with care management team member scheduled for: 03-31-2023 at 1030 am          Plan:Telephone follow up appointment with care management team member scheduled for:  03-31-2023 at 1030 am  Alto Denver RN, MSN, CCM RN Care Manager  Chronic Care Management Direct Number: [phone]

## 2023-01-28 NOTE — Patient Instructions (Signed)
Please call the care guide team at [phone] if you need to cancel or reschedule your appointment.   If you are experiencing a Mental Health or Behavioral Health Crisis or need someone to talk to, please call the Suicide and Crisis Lifeline: 988 call the [place] National Suicide Prevention Lifeline: (636)869-7749 or TTY: 603-696-7879 TTY 2624646981) to talk to a trained counselor call 1-800-273-TALK (toll free, 24 hour hotline) go to [facility] [address], [place] [phone])   Following is a copy of the CCM Program Consent:  CCM service includes personalized support from designated clinical staff supervised by the physician, including individualized plan of care and coordination with other care providers 24/7 contact phone numbers for assistance for urgent and routine care needs. Service will only be billed when office clinical staff spend 20 minutes or more in a month to coordinate care. Only one practitioner may furnish and bill the service in a calendar month. The patient may stop CCM services at amy time (effective at the end of the month) by phone call to the office staff. The patient will be responsible for cost sharing (co-pay) or up to 20% of the service fee (after annual deductible is met)  Following is a copy of your full provider care plan:   Goals Addressed             This Visit's Progress    CCM Expected Outcome:  Monitor, Self-Manage and Reduce Symptoms of Heart Failure       Current Barriers:  Knowledge Deficits related to the importance of daily weights in the effective management of HF Care Coordination needs related to [facility] and resources needed for effective medication management  in a patient with HF Chronic Disease Management support and education needs related to effective management of HF Wt Readings from Last 3 Encounters:  01/11/23 141 lb (64 kg)  01/08/23 141 lb 14.4 oz (64.4 kg)  12/16/22 141 lb 9.6  oz (64.2 kg)     Planned Interventions: Basic overview and discussion of pathophysiology of Heart Failure reviewed. The patient is stable. Denies any new issues with her heart failure or heart health. Follows up next month with her cardiologist. She is post surgery on her neck/back and healing well. She states the pain is minimal compared to what it was. She is thankful for the surgery being successful. She only was in the hospital overnight. Review of surgical incision and she states everything is healing well. Will continue to monitor for changes.  Provided education on low sodium diet. The patient states compliance with dietary restrictions Reviewed Heart Failure Action Plan in depth. The patient monitors her weight and is around 141 now. She says that she is not having any exacerbations in  her heart failure at this time. Sees specialist on a regular basis. No changes in the plan of care.   Assessed need for readable accurate scales in home. Has scales in her home Provided education about placing scale on hard, flat surface Advised patient to weigh each morning after emptying bladder Discussed importance of daily weight and advised patient to weigh and record daily. The patient states she does not weigh daily. The patient states that she weighs a couple of times a week. Education on the benefits of daily weights and monitoring for changes in her swelling/edema and monitoring for heart failure exacerbations. Ask the patient to call the office for +2/3 pounds in one day or +5 pounds in one week. Will continue to  monitor Review of sx and sx of heart failure and ask the patient to monitor for acute changes or onset:  F- Fatigue  A- Activity intolerance  C- Chest congestion or cough  E- Edema or Swelling in feet, legs, abdomen  S- Shortness of breath Reviewed role of diuretics in prevention of fluid overload and management of heart failure. The patient takes Lasix 20 mg QD for effective management of  fluid balance Discussed the importance of keeping all appointments with provider. Keeps appointments. Has upcoming appointment with the cardiologist Dr. Loleta Chance.  Provided patient with education about the role of exercise in the management of heart failure Advised patient to discuss changes in HF, acute onset of swelling or edema noted, and other sx and sx of HF with provider Screening for signs and symptoms of depression related to chronic disease state  Assessed social determinant of health barriers EF% 60 to 65% 09-15-2021  Symptom Management: Take medications as prescribed   Attend all scheduled provider appointments Call provider office for new concerns or questions  call the Suicide and Crisis Lifeline: 988 call the [place] National Suicide Prevention Lifeline: 408-537-3265 or TTY: 907 260 1145 TTY 4148323377) to talk to a trained counselor call 1-800-273-TALK (toll free, 24 hour hotline) if experiencing a Mental Health or Behavioral Health Crisis  call office if I gain more than 2 pounds in one day or 5 pounds in one week use salt in moderation watch for swelling in feet, ankles and legs every day weigh myself daily develop a rescue plan follow rescue plan if symptoms flare-up eat more whole grains, fruits and vegetables, lean meats and healthy fats track symptoms and what helps feel better or worse dress right for the weather, hot or cold  Follow Up Plan: Telephone follow up appointment with care management team member scheduled for: 03-31-2023 at 1030 am       CCM Expected Outcome:  Monitor, Self-Manage, and Reduce Symptoms of Hypertension       Current Barriers:  Knowledge Deficits related to consistent measurement of blood pressures and to report blood pressures that are consistently out of range Chronic Disease Management support and education needs related to effective management of HTN BP Readings from Last 3 Encounters:  01/12/23 (!) 144/48  01/08/23 (!) 162/44   12/16/22 (!) 153/72     Planned Interventions: Evaluation of current treatment plan related to hypertension self management and patient's adherence to plan as established by provider. The patient has a cardiologist that she sees on a regular basis. States that her blood pressures are more stable post surgery on her neck. The patient is home after surgery on 01-11-2023 with an overnight stay. She feels she is stable and doing well. Denies any acute changes in her HTN or heart health. Provided education to patient re: stroke prevention, s/s of heart attack and stroke; Reviewed prescribed diet heart healthy diet. Review and education provided. The patient watches her sodium content. Says that she has an appetite sometimes and sometimes she does not. She denies any issues with dietary restrictions. Reviewed medications with patient and discussed importance of compliance. The patient is compliant with medications and has worked with pharm D in the past. Ongoing support and medication management with the pharm D. Counseled on the importance of exercise goals with target of 150 minutes per week Discussed plans with patient for ongoing care management follow up and provided patient with direct contact information for care management team; Advised patient, providing education and rationale, to monitor blood pressure  daily and record, calling PCP for findings outside established parameters. The patient does take her blood pressures at home. No readings available at the time of the call. Discussed the goals of blood pressures: <150 systolic and <90 diastolic;  Reviewed scheduled/upcoming provider appointments including: Sees pcp when needed. Has upcoming appointments with Dr. Loleta Chance and Dr. Jordan Likes in June. Advised patient to discuss changes in blood pressures or heart health with provider; Provided education on prescribed diet heart healthy diet ;  Discussed complications of poorly controlled blood pressure such as  heart disease, stroke, circulatory complications, vision complications, kidney impairment, sexual dysfunction;  Screening for signs and symptoms of depression related to chronic disease state;  Assessed social determinant of health barriers;   Symptom Management: Take medications as prescribed   Attend all scheduled provider appointments Call provider office for new concerns or questions  call the Suicide and Crisis Lifeline: 988 call the [place] National Suicide Prevention Lifeline: (430)308-4230 or TTY: (440)247-3787 TTY (863)182-4684) to talk to a trained counselor call 1-800-273-TALK (toll free, 24 hour hotline) if experiencing a Mental Health or Behavioral Health Crisis  check blood pressure 3 times per week learn about high blood pressure take blood pressure log to all doctor appointments call doctor for signs and symptoms of high blood pressure develop an action plan for high blood pressure keep all doctor appointments take medications for blood pressure exactly as prescribed report new symptoms to your doctor eat more whole grains, fruits and vegetables, lean meats and healthy fats  Follow Up Plan: Telephone follow up appointment with care management team member scheduled for: 03-31-2023 at 1030 am          Patient verbalizes understanding of instructions and care plan provided today and agrees to view in MyChart. Active MyChart status and patient understanding of how to access instructions and care plan via MyChart confirmed with patient.  Telephone follow up appointment with care management team member scheduled for: 03-31-2023 at 1030 am

## 2023-01-29 DIAGNOSIS — I110 Hypertensive heart disease with heart failure: Secondary | ICD-10-CM

## 2023-01-29 DIAGNOSIS — I5030 Unspecified diastolic (congestive) heart failure: Secondary | ICD-10-CM

## 2023-02-02 NOTE — Progress Notes (Signed)
I saw Catherine Green in neurology clinic on 02/10/23 in follow up for muscle weakness and gait instability.  HPI: LENORA HUEBERT is a 82 y.o. year old female with a history of lumbar spine disease s/p surgery (~2018), HTN, HLD, CAD s/p CABG (2004), carotid stenosis s/p surgery on both sides multiple times, depression, PVD, edema (on lasix), previous smoker who we last saw on 11/12/22.  To briefly review: 07/30/22: Patient has had weakness off and on for 2-3 years. The weakness is in her legs. It is especially bad when it is cold. She gets pain in her back, front of her legs, and down below knee. It is an electric pain. She can have leg soreness the day after bad electric pains. It has been getting worse. She states this has been going on for a while (not sure exactly when). She finds when she walks, things are worse. When she sits down, symptoms improve. When she is in a store walking, hanging on a cart helps her move better. She will use a walker in her condo.   Patient had one fall, on 07/07/22 when she fell at the dentist. She has almost fallen other times but grabs onto her husband.   She has a history of lumbar spine disease. She had surgery in ~2018 and has been told everything has been done that can be done. She previously got injections as well.   She takes gabapentin 400 mg every 8 hours for pain. This helps but causes some cognitive slowing for her. She takes B12 1000 mcg daily. She was previously on crestor 20 mg and zetia 10 mg daily for many years. She recently stopped the crestor but is still on the zetia due to concerns for weakness on 07/17/22. This has not changed her symptoms.    Patient denies diplopia, ptosis, problems chewing, changes to her voice. She occasionally gets choked when eating bread or something she has to chew a lot. She occasionally coughs when drinking water. She has had this looked at with endoscopy years ago and a polyp was removed. She does not think this  helped.   There are no neuromuscular respiratory weakness symptoms, particularly orthopnea>dyspnea.    Pseudobulbar affect is absent.   She endorses dry eyes and mouth. She denies bowel or bladder problems, early satiety, or postprandial bloating.   She has lost 30 pounds over the last year but stable over the last year. Patient feels this was due to taking lasix and loss of edema.   EtOH use: None  Restrictive diet? No Family history of neuropathy/myopathy/NM disease? No   Patient had an EMG in 2019, prior to symptoms in her legs (normal EMG).  11/12/22: Labs were normal. EMG on 09/28/22 was normal with no evidence of neuropathy, myopathy, or left cervical or lumbosacral radiculopathy.   Patient's symptoms improved with therapy. She finished therapy recently and feels like she has returned to previously low. She has home exercises. She states she was keeping up with the exercises until she got sick about 3 weeks. She has a sinus infection and has felt dizzy and hearing worse than normal.   She has had no falls. She uses a walker at home.  Most recent Assessment and Plan (11/11/21): This is LORENNA GURMAN, a 82 y.o. female with neck, back, and leg pain. Previous lab work evaluating for myopathy and neuropathy was normal. EMG was also normal. Her examination today is significant for improved strength, but hyperreflexia and continued gait  abnormality that appears more spastic. I am concerned for possible cervical stenosis or mimic. Patient's symptoms improved with PT, but recently regressed after getting sick (sinus infection per patient).   Plan: -Blood work: vit E, copper -MRI cervical spine wo contrast -Continue home exercises given PT -Fall precautions discussed and given to patient  Since their last visit: Labs were normal. MRI cervical spine confirmed spinal stenosis, so patient was referred to spine surgery. Patient had C5-C7 fusion on 01/11/23.   She mentions that she is still  weak. She is having difficulty swallowing, which she states is new from surgery. It started a couple of weeks after surgery. Her voice is weaker as well.  She thinks overall she is walking better. She has not had any falls. Her legs have not given out on her. She denies any neck pain or numbness or tingling in arms or legs. She denies orthopnea. She denies muscle twitching.   MEDICATIONS:  Outpatient Encounter Medications as of 02/10/2023  Medication Sig   albuterol (VENTOLIN HFA) 108 (90 Base) MCG/ACT inhaler Inhale 2 puffs into the lungs every 6 (six) hours as needed for wheezing or shortness of breath.   amLODipine (NORVASC) 2.5 MG tablet Take 0.5 tablets (1.25 mg total) by mouth in the morning.   aspirin 81 MG tablet Take 1 tablet (81 mg total) by mouth daily.   Coenzyme Q10 (CO Q10) 100 MG CAPS Take 100 mg by mouth daily.   cyclobenzaprine (FLEXERIL) 5 MG tablet Take 1-2 tablets (5-10 mg total) by mouth 3 (three) times daily as needed for muscle spasms.   ezetimibe (ZETIA) 10 MG tablet Take 1 tablet (10 mg total) by mouth daily.   furosemide (LASIX) 20 MG tablet TAKE 1 TABLET BY MOUTH DAILY.   gabapentin (NEURONTIN) 400 MG capsule Take 1 capsule (400 mg total) by mouth 3 (three) times daily as needed. (Patient taking differently: Take 400 mg by mouth 3 (three) times daily.)   loratadine (CLARITIN) 10 MG tablet Take 10 mg by mouth daily.   metoprolol succinate (TOPROL-XL) 25 MG 24 hr tablet Take 12.5 mg by mouth at bedtime.   omeprazole (PRILOSEC) 20 MG capsule Take 1 capsule (20 mg total) by mouth 2 (two) times daily before a meal.   polyethylene glycol powder (GLYCOLAX/MIRALAX) 17 GM/SCOOP powder Take 17 g by mouth 2 (two) times daily as needed.   potassium chloride (KLOR-CON) 10 MEQ tablet Take 1 tablet (10 mEq total) by mouth daily.   rosuvastatin (CRESTOR) 20 MG tablet Take 1 tablet (20 mg total) by mouth daily.   sertraline (ZOLOFT) 100 MG tablet TAKE ONE AND A HALF TABLETS BY MOUTH  EVERY DAY   traMADol (ULTRAM) 50 MG tablet Take 0.5-1 tablets (25-50 mg total) by mouth every 6 (six) hours as needed for moderate pain.   vitamin B-12 (CYANOCOBALAMIN) 1000 MCG tablet Take 1 tablet (1,000 mcg total) by mouth daily.   No facility-administered encounter medications on file as of 02/10/2023.    PAST MEDICAL HISTORY: Past Medical History:  Diagnosis Date   Allergy    hay fever   Anemia    Anxiety    Back pain    Carotid artery occlusion    Cerebrovascular disease    extracranial; occlusive   Chicken pox    Coronary artery disease    Depression    Dizziness    Dizziness    DVT (deep venous thrombosis) (HCC)    Fainting spell    GERD (gastroesophageal reflux disease)  Headache    Heart murmur    Hyperlipidemia    Hypertension    Leg pain    Mitral regurgitation    PONV (postoperative nausea and vomiting)    severe nausea and vomiting   Pre-syncope    PVD (peripheral vascular disease) (HCC)    endarterectomy by Dr. Arbie Cookey   Seasonal allergies    Shortness of breath dyspnea    wth ambulation at times   Swelling of both ankles    and abdomen; takes Lasix when needed   Thrombophlebitis    following childbirth   Ulcer     PAST SURGICAL HISTORY: Past Surgical History:  Procedure Laterality Date   ANTERIOR CERVICAL DECOMP/DISCECTOMY FUSION N/A 01/11/2023   Procedure: Anterior Cervical Decompression/Discectomy Fusion Cervical Five-Cervical Six - Cervical Six-Cervical Seven;  Surgeon: Julio Sicks, MD;  Location: [facility] OR;  Service: Neurosurgery;  Laterality: N/A;  3C   APPENDECTOMY     BREAST SURGERY     saline implants   CARDIAC CATHETERIZATION     CAROTID ENDARTERECTOMY  1992   CAROTID ENDARTERECTOMY Right 02/13/2005   Re-do Right CE   CATARACT EXTRACTION, BILATERAL  2013   CHOLECYSTECTOMY, LAPAROSCOPIC  04/02/2014   Dr. Renda Rolls   CORONARY ARTERY BYPASS GRAFT  2006   x3 Dr. Donata Clay   ENDARTERECTOMY Left 07/20/2014   Procedure: ENDARTERECTOMY  CAROTID-LEFT;  Surgeon: Larina Earthly, MD;  Location: [facility] OR;  Service: Vascular;  Laterality: Left;   EYE SURGERY Bilateral 10/2011   Cataract Left eye   SEPTOPLASTY     with bilateral inferior turbinate reductions   SPINE SURGERY  12/2015   TOTAL ABDOMINAL HYSTERECTOMY     UPPER GASTROINTESTINAL ENDOSCOPY     had polyps removed from esophagus   UPPER GI ENDOSCOPY      ALLERGIES: Allergies  Allergen Reactions   Lyrica [Pregabalin] Shortness Of Breath, Swelling and Other (See Comments)    chest tight   Hydrocodone Other (See Comments)    hallucinate   Ivp Dye [Iodinated Contrast Media] Hives and Itching        Lipitor [Atorvastatin] Nausea And Vomiting   Methylprednisolone Other (See Comments)    Severe weekness    Oxycodone Hcl Other (See Comments)    Hallucinations    Penicillin G Nausea And Vomiting   Shellfish Allergy Hives and Itching   Buspar [Buspirone]     Worsening mood   Clavulanic Acid    Prednisone    Codeine Nausea And Vomiting and Other (See Comments)   Cymbalta [Duloxetine Hcl] Nausea Only    GI upset at 30mg    Demerol Nausea And Vomiting   Doxycycline Nausea Only and Other (See Comments)    Weakness, sick to her stomach   Effexor [Venlafaxine] Nausea Only   Hydrochlorothiazide Other (See Comments)    Not an allergy but urinary frequency and sweating at 25mg    Hydroxyzine Other (See Comments)    Excessive sweating.    Metoprolol Other (See Comments)    Slowed body down, per pt    Protonix [Pantoprazole Sodium] Nausea Only   Trazodone And Nefazodone Other (See Comments)    Sedation     FAMILY HISTORY: Family History  Problem Relation Age of Onset   Aneurysm Mother        brain   Heart disease Mother        Aneyursm    Hyperlipidemia Mother    Hypertension Mother    Varicose Veins Mother    Bleeding Disorder  Mother    Heart disease Father    Cirrhosis Father    Heart attack Father    Hyperlipidemia Father    Hypertension Father    Cancer  Sister        lung   Heart disease Sister        Aneurysm   Hyperlipidemia Sister    Hypertension Sister    Varicose Veins Sister    Bleeding Disorder Sister    Heart disease Brother        Before age 55   Aneurysm Brother    Deep vein thrombosis Brother    Birth defects Brother    Hyperlipidemia Brother    Hypertension Brother    Peripheral vascular disease Daughter    Hyperlipidemia Daughter    Hypertension Daughter    Hypertension Other    Breast cancer Neg Hx    Colon cancer Neg Hx     SOCIAL HISTORY: Social History   Tobacco Use   Smoking status: Former    Packs/day: 0.30    Years: 15.00    Additional pack years: 0.00    Total pack years: 4.50    Types: Cigarettes    Quit date: 08/31/1996    Years since quitting: 26.4    Passive exposure: Never   Smokeless tobacco: Never  Vaping Use   Vaping Use: Never used  Substance Use Topics   Alcohol use: No    Alcohol/week: 0.0 standard drinks of alcohol   Drug use: No   Social History   Social History Narrative   Lives with husband in a one level    Work - retired Producer, television/film/video   Diet - healthy   Right handed    Caffeine- 1 cup per day.      Had 1 daughter. Daughter is deceased    Objective:  Vital Signs:  BP 138/86   Pulse 62   Ht 5\' 2"  (1.575 m)   Wt 139 lb (63 kg)   SpO2 98%   BMI 25.42 kg/m   General: General appearance: Awake and alert. No distress. Cooperative with exam.  Skin: No obvious rash or jaundice. HEENT: Atraumatic. Anicteric. Lungs: Non-labored breathing on room air  Extremities: No edema.   Neurological: Mental Status: Alert. Speech fluent. No pseudobulbar affect Cranial Nerves: CNII: No RAPD. Visual fields intact. CNIII, IV, VI: PERRL. No nystagmus. EOMI. CN V: Facial sensation intact bilaterally to fine touch. [place] clench strong. Jaw jerk positive. CN VII: Facial muscles symmetric and strong. No ptosis at rest. CN VIII: Hears finger rub well bilaterally. CN IX: No  hypophonia. CN X: Palate elevates symmetrically. CN XI: Full strength shoulder shrug bilaterally. CN XII: Tongue protrusion full and midline. No atrophy or fasciculations. Hoarseness but not dysarthria. Motor: Tone is mildly increased in all extremities. No fasciculations in any extremities. No atrophy.  Individual muscle group testing (MRC grade out of 5):  Movement     Neck flexion 5    Neck extension 5     Right Left   Shoulder abduction 5 5   Shoulder adduction 5 5   Elbow flexion 5 5   Elbow extension 5 4+   Wrist extension 5 5   Wrist flexion 5 5   Finger abduction - FDI 5 5   Finger abduction - ADM 5 5   Finger extension 5 5   Finger distal flexion - 2/3 5 5    Finger distal flexion - 4/5 5 5    Thumb flexion - FPL  5 5   Thumb abduction - APB 5 5    Hip flexion 5 5   Knee extension 5 5   Knee flexion 5 5   Dorsiflexion 5 5   Plantarflexion 5 5    Reflexes:  Right Left  Bicep 2+ 3+  Tricep 2+ 2+  BrRad 2+ 3+  Knee 2+ 2+  Ankle 2+ 2+   Pathological Reflexes: Babinski: mute response bilaterally Hoffman: absent bilaterally Troemner: present (L > R) Facial: absent bilaterally Midline tap: absent Sensation: Pinprick: Intact in all extremities Coordination: Intact finger-to- nose-finger and heel-to-shin bilaterally. Gait: Unsteady, narrow-based. Mildly spastic gait.   Lab and Test Review: New results: 11/11/21: Vit E wnl Copper wnl  MRI cervical spine wo contrast (12/05/22): FINDINGS: Alignment: Straightening of the normal cervical lordosis.   Vertebrae: Chronic fusion of the facets on the right at C3-4.   Cord: See below regarding spinal stenosis with cord deformity most pronounced at C5-6. Probable early abnormal T2 signal within the cord.   Posterior Fossa, vertebral arteries, paraspinal tissues: Negative   Disc levels:   The foramen magnum is widely patent. There is ordinary mild osteoarthritis of the C1-2 articulation but no encroachment upon  the neural structures.   C2-3: Mild disc bulge. Mild facet osteoarthritis. Mild left foraminal stenosis.   C3-4: Chronic facet fusion on the right. No disc pathology. No canal or foraminal stenosis.   C4-5: Endplate osteophytes and small central disc protrusion. Narrowing of the ventral subarachnoid space but no compression of cord. AP diameter of the canal in the midline 8.2 mm. Facet degeneration on the left. Mild left foraminal narrowing.   C5-6: Spondylosis with endplate osteophytes and a broad-based disc herniation more prominent to the right of midline. Spinal stenosis with AP diameter in the midline only 6.5 mm. Effacement the subarachnoid space and some deformity the cord, more on the right. Early abnormal T2 signal at this level. Mild to moderate foraminal stenosis.   C6-7: Endplate osteophytes and broad-based disc herniation with slight caudal down turning. Narrowing of the subarachnoid space with AP diameter of the canal only 6.7 mm. Mild cord deformity. Bilateral foraminal stenosis of a moderate degree.   C7-T1: Minimal disc bulge.  No canal or foraminal stenosis.   IMPRESSION: 1. Degenerative spondylosis at C5-6 with endplate osteophytes and broad-based disc herniation more prominent to the right of midline. Spinal stenosis with AP diameter of the canal only 6.5 mm. Effacement of the subarachnoid space and some deformity of the cord, more on the right. Early abnormal T2 signal within the cord at this level. Mild to moderate bilateral foraminal stenosis. Patient would be at risk of compressive myelopathy based on these findings. 2. C6-7: Endplate osteophytes and broad-based disc herniation with slight caudal down turning. Spinal stenosis with AP diameter of the canal only 6.7 mm. Mild cord deformity. Moderate bilateral foraminal stenosis. Some risk of compressive myelopathy at this level as well. 3. C4-5: Endplate osteophytes and small central disc protrusion.  AP diameter of the canal 8.2 mm. Mild left foraminal narrowing. 4. Chronic facet fusion on the right at C3-4.  Cervical spine xray (01/11/23): FINDINGS: Two intraoperative fluoroscopic views of the cervical spine. Alignment is anatomic. C5, C6, C7 anterior fusion plate is present. 10 seconds of fluoro time. Dose: 1.06 micro gray.   IMPRESSION: C5, C6, C7 anterior fusion.  Previously reviewed results: 07/30/22: HMGCR ab: < 2 Vit E wnl Copper wnl B1 wnl IFE: no M protein ANA/ENA wnl   07/17/22 labs:  CK: 134 (409 four years ago) B12: 874 TSH: 1.02 ESR: 25 CBC and CMP wnl   HbA1c (02/07/22): 5.8   MRI lumbar spine wo contrast (11/12/20): FINDINGS: Segmentation: Consistent with the previous exams, L5 is a transitional vertebra, largely sacralized.   Alignment:  Normal   Vertebrae:  No fracture or primary bone lesion.   Conus medullaris and cauda equina: Conus extends to the L1 level. Conus and cauda equina appear normal.   Paraspinal and other soft tissues: Negative. Insignificant small renal cysts.   Disc levels:   T12-L1 and L1-2: Normal.   L2-3: Mild bulging of the disc. Mild ligamentous prominence. Mild canal narrowing but no neural compression. No change since 2020.   L3-4: Mild bulging of the disc. Mild ligamentous prominence. Mild canal narrowing but no neural compression. No change since 2020.   L4-5: Previous partial in ectomy on the right. Moderate bulging of the disc. Mild facet hypertrophy. Mild narrowing of the lateral recesses but without visible neural compression. No change since 2020.   L5-S1: Transitional level.  Rudimentary normal disc.  No stenosis.   IMPRESSION: 1. Consistent with the previous exams, L5 is a transitional vertebra, largely sacralized. 2. No change since the study 05/25/2019. Mild chronic degenerative changes at L2-3, L3-4 and L4-5 with disc bulges and ligamentous prominence but no apparent neural compression. Findings  could contribute to back pain. Previous right laminotomy L4-5.   MRI brain w/wo contrast (08/03/18): FINDINGS: Brain: IAC protocol was performed including thin section imaging through the posterior fossa before and after intravenous contrast. Seventh and 8th cranial nerves are normal. Negative for vestibular schwannoma. Basilar cisterns normal. Brainstem and cerebellum normal. Mastoid sinus is clear bilaterally. No enhancing mass in the posterior fossa or temporal bone.   Ventricle size and cerebral volume normal. Negative for acute infarct. Scattered white matter hyperintensities bilaterally compatible with chronic microvascular ischemia.   Vascular: Normal arterial flow voids. Normal venous enhancement without thrombosis.   Skull and upper cervical spine: Negative   Sinuses/Orbits: Paranasal sinuses clear. Mastoid sinus clear. Bilateral cataract surgery.   Other: None   IMPRESSION: No cause for hearing loss. Negative for vestibular schwannoma or other mass in the posterior fossa.   Mild chronic microvascular ischemic change in the cerebral white matter bilaterally.   EMG (05/26/18 - [facility]): FINDINGS: NERVE CONDUCTION STUDY: Bilateral tibial and peroneal motor responses are normal. Bilateral sural and superficial peroneal sensory responses are normal. Bilateral tibial F wave latencies are normal. NEEDLE ELECTROMYOGRAPHY: Needle examination of left upper extremity and left lower extremity is unremarkable. IMPRESSION:  This is a normal study. No electrodiagnostic evidence of large fiber neuropathy or myopathy at this time.  EMG (09/28/22): NCV & EMG Findings: Extensive electrodiagnostic evaluation of the left upper and lower limbs show: Left sural, superficial peroneal/fibular, median, ulnar, and radial sensory responses are within normal limits. Left peroneal/fibular (EDB), tibial (AH), median (APB), and ulnar (ADM) motor responses are within normal limits. Left H reflex  latency is within normal limits. There is no evidence of active or chronic motor axon loss changes affecting any of the tested muscles on needle examination. Motor unit configuration and recruitment pattern is within normal limits.   Impression: This is a normal electrodiagnostic evaluation. There is no electrodiagnostic evidence of a large fiber peripheral neuropathy, myopathy, left lumbosacral (L2-S1) motor radiculopathy, or left cervical (C5-C8) motor radiculopathy.  ASSESSMENT: This is Catherine Green, a 82 y.o. female with gait instability and imbalance found to have cervical stenosis. She  had C5-C7 fusion on 01/11/23. Her walking has improved some since surgery, despite not being very active. She mentions some difficulty swallowing since the surgery. I am not sure if this is expected after the surgery due to local swelling, but I encouraged her to speak with spine surgeon about this and also if she needs physical therapy. I am monitoring closely, but do not see current evidence of other pathology such as motor neuron disease that may also explain symptoms. The normal EMG ion 09/28/22 also argues against this.  Plan: -Will continue to monitor symptoms -Encouraged patient to tell spine surgeon about swallowing difficulties -May consider speech evaluation if symptoms do not improve -Patient to ask spine surgeon about if more PT is needed -Fall precautions discussed  Return to clinic in 6 months  Total time spent reviewing records, interview, history/exam, documentation, and coordination of care on day of encounter:  40 min  Jacquelyne Balint, MD

## 2023-02-08 ENCOUNTER — Other Ambulatory Visit: Payer: Self-pay | Admitting: Family Medicine

## 2023-02-08 NOTE — Telephone Encounter (Signed)
Patient had cervical decompression and fusion on 01/11/23. Ok to refill or should she be getting from neurosurgery in post op follow up.

## 2023-02-09 NOTE — Telephone Encounter (Signed)
Please verify current dosing with patient.  I can send the refill at that point.  Please request most recent neurosurgery clinic notes.

## 2023-02-10 ENCOUNTER — Other Ambulatory Visit: Payer: Self-pay | Admitting: Physician Assistant

## 2023-02-10 ENCOUNTER — Ambulatory Visit: Payer: Medicare HMO | Admitting: Neurology

## 2023-02-10 ENCOUNTER — Encounter: Payer: Self-pay | Admitting: Neurology

## 2023-02-10 VITALS — BP 138/86 | HR 62 | Ht 62.0 in | Wt 139.0 lb

## 2023-02-10 DIAGNOSIS — M4722 Other spondylosis with radiculopathy, cervical region: Secondary | ICD-10-CM | POA: Diagnosis not present

## 2023-02-10 DIAGNOSIS — R2681 Unsteadiness on feet: Secondary | ICD-10-CM

## 2023-02-10 DIAGNOSIS — M4712 Other spondylosis with myelopathy, cervical region: Secondary | ICD-10-CM | POA: Diagnosis not present

## 2023-02-10 DIAGNOSIS — M542 Cervicalgia: Secondary | ICD-10-CM | POA: Diagnosis not present

## 2023-02-10 DIAGNOSIS — M4802 Spinal stenosis, cervical region: Secondary | ICD-10-CM | POA: Diagnosis not present

## 2023-02-10 DIAGNOSIS — R292 Abnormal reflex: Secondary | ICD-10-CM

## 2023-02-10 DIAGNOSIS — R2689 Other abnormalities of gait and mobility: Secondary | ICD-10-CM | POA: Diagnosis not present

## 2023-02-10 DIAGNOSIS — Z6825 Body mass index (BMI) 25.0-25.9, adult: Secondary | ICD-10-CM | POA: Diagnosis not present

## 2023-02-10 NOTE — Patient Instructions (Addendum)
Please tell your spine surgeon about swallowing difficulties and ask if this is normal and if so, when it might improve.  I can consider speech evaluation if symptoms do not improve.  Also ask spine surgeon about if you need more physical therapy.   Return to clinic in 6 months  The physicians and staff at [facility] are committed to providing excellent care. You may receive a survey requesting feedback about your experience at our office. We strive to receive "very good" responses to the survey questions. If you feel that your experience would prevent you from giving the office a "very good " response, please contact our office to try to remedy the situation. We may be reached at [phone]. Thank you for taking the time out of your busy day to complete the survey.  Jacquelyne Balint, MD [facility]  Preventing Falls at [facility] are common, often dreaded events in the lives of older people. Aside from the obvious injuries and even death that may result, fall can cause wide-ranging consequences including loss of independence, mental decline, decreased activity and mobility. Younger people are also at risk of falling, especially those with chronic illnesses and fatigue.  Ways to reduce risk for falling Examine diet and medications. Warm foods and alcohol dilate blood vessels, which can lead to dizziness when standing. Sleep aids, antidepressants and pain medications can also increase the likelihood of a fall.  Get a vision exam. Poor vision, cataracts and glaucoma increase the chances of falling.  Check foot gear. Shoes should fit snugly and have a sturdy, nonskid sole and a broad, low heel  Participate in a physician-approved exercise program to build and maintain muscle strength and improve balance and coordination. Programs that use ankle weights or stretch bands are excellent for muscle-strengthening. Water aerobics programs and low-impact Tai Chi programs have also been shown  to improve balance and coordination.  Increase vitamin D intake. Vitamin D improves muscle strength and increases the amount of calcium the body is able to absorb and deposit in bones.  How to prevent falls from common hazards Floors - Remove all loose wires, cords, and throw rugs. Minimize clutter. Make sure rugs are anchored and smooth. Keep furniture in its usual place.  Chairs -- Use chairs with straight backs, armrests and firm seats. Add firm cushions to existing pieces to add height.  Bathroom - Install grab bars and non-skid tape in the tub or shower. Use a bathtub transfer bench or a shower chair with a back support Use an elevated toilet seat and/or safety rails to assist standing from a low surface. Do not use towel racks or bathroom tissue holders to help you stand.  Lighting - Make sure halls, stairways, and entrances are well-lit. Install a night light in your bathroom or hallway. Make sure there is a light switch at the top and bottom of the staircase. Turn lights on if you get up in the middle of the night. Make sure lamps or light switches are within reach of the bed if you have to get up during the night.  Kitchen - Install non-skid rubber mats near the sink and stove. Clean spills immediately. Store frequently used utensils, pots, pans between waist and eye level. This helps prevent reaching and bending. Sit when getting things out of lower cupboards.  Living room/ Bedrooms - Place furniture with wide spaces in between, giving enough room to move around. Establish a route through the living room that gives you something to hold  onto as you walk.  Stairs - Make sure treads, rails, and rugs are secure. Install a rail on both sides of the stairs. If stairs are a threat, it might be helpful to arrange most of your activities on the lower level to reduce the number of times you must climb the stairs.  Entrances and doorways - Install metal handles on the walls adjacent to the  doorknobs of all doors to make it more secure as you travel through the doorway.  Tips for maintaining balance Keep at least one hand free at all times. Try using a backpack or fanny pack to hold things rather than carrying them in your hands. Never carry objects in both hands when walking as this interferes with keeping your balance.  Attempt to swing both arms from front to back while walking. This might require a conscious effort if Parkinson's disease has diminished your movement. It will, however, help you to maintain balance and posture, and reduce fatigue.  Consciously lift your feet off of the ground when walking. Shuffling and dragging of the feet is a common culprit in losing your balance.  When trying to navigate turns, use a "U" technique of facing forward and making a wide turn, rather than pivoting sharply.  Try to stand with your feet shoulder-length apart. When your feet are close together for any length of time, you increase your risk of losing your balance and falling.  Do one thing at a time. Don't try to walk and accomplish another task, such as reading or looking around. The decrease in your automatic reflexes complicates motor function, so the less distraction, the better.  Do not wear rubber or gripping soled shoes, they might "catch" on the floor and cause tripping.  Move slowly when changing positions. Use deliberate, concentrated movements and, if needed, use a grab bar or walking aid. Count 15 seconds between each movement. For example, when rising from a seated position, wait 15 seconds after standing to begin walking.  If balance is a continuous problem, you might want to consider a walking aid such as a cane, walking stick, or walker. Once you've mastered walking with help, you might be ready to try it on your own again.

## 2023-02-10 NOTE — Telephone Encounter (Signed)
LMTCB

## 2023-02-11 NOTE — Telephone Encounter (Signed)
Sent. Thanks.   

## 2023-02-11 NOTE — Telephone Encounter (Signed)
Patient called back and how rx is written is how she is taking it. 400 mg TID. Records request sent to neurosurgery.

## 2023-03-18 DIAGNOSIS — M4802 Spinal stenosis, cervical region: Secondary | ICD-10-CM | POA: Diagnosis not present

## 2023-03-18 DIAGNOSIS — Z6825 Body mass index (BMI) 25.0-25.9, adult: Secondary | ICD-10-CM | POA: Diagnosis not present

## 2023-03-19 ENCOUNTER — Other Ambulatory Visit: Payer: Self-pay | Admitting: Family Medicine

## 2023-03-19 MED ORDER — GABAPENTIN 400 MG PO CAPS: 400.0000 mg | ORAL_CAPSULE | Freq: Three times a day (TID) | ORAL | 5 refills | Status: DC | PRN

## 2023-03-19 NOTE — Progress Notes (Signed)
Refill printed and given to patient's husband for him to deliver.

## 2023-03-24 ENCOUNTER — Other Ambulatory Visit: Payer: Medicare HMO | Admitting: Pharmacist

## 2023-03-24 NOTE — Patient Instructions (Signed)
Goals Addressed             This Visit's Progress    Pharmacy Goal       Check your blood pressure once daily, and any time you have concerning symptoms like headache, chest pain, dizziness, shortness of breath, or vision changes.    To appropriately check your blood pressure, make sure you do the following:  1) Avoid caffeine, exercise, or tobacco products for 30 minutes before checking. Empty your bladder. 2) Sit with your back supported in a flat-backed chair. Rest your arm on something flat (arm of the chair, table, etc). 3) Sit still with your feet flat on the floor, resting, for at least 5 minutes.  4) Check your blood pressure. Take 1-2 readings.  5) Write down these readings and bring with you to any provider appointments.  Bring your home blood pressure machine with you to a provider's office for accuracy comparison at least once a year.   Make sure you take your blood pressure medications before you come to any office visit, even if you were asked to fast for labs.  Elisabeth Delles, PharmD, BCACP [facility] [phone]         

## 2023-03-24 NOTE — Progress Notes (Signed)
03/24/2023 Name: Catherine Green MRN: [id number] DOB: 1941-07-14  Chief Complaint  Patient presents with   Medication Management    Catherine Green is a 82 y.o. year old female who presented for a telephone visit.   They were referred to the pharmacist by their PCP for assistance in managing hypertension and heart failure .    Subjective:   Care Team: Primary Care Provider: Joaquim Nam, MD  Cardiologist: Julien Nordmann, MD  Neurologist: Antony Madura, MD; Next Scheduled Visit: 08/13/2023   Medication Access/Adherence  Current Pharmacy:  [facility] - [place], [place] - [address] [place] [address] Phone: [phone] Fax: [phone]   Patient reports affordability concerns with their medications: No  Patient reports access/transportation concerns to their pharmacy: No  Patient reports adherence concerns with their medications:  No       HFpEF /Hypertension:   Patient followed by [facility] - Note patient with history of orthostatic hypotension    Current medications:  Beta blocker: metoprolol ER 25 mg - 1/2 tablet (12.5 mg) QHS Diuretic regimen: furosemide 20 mg daily Potassium supplement: potassium chloride 10 meq daily CCB: amlodipine 2.5 mg - 1/2 tablet (1.25 mg) daily   Recent home blood pressure readings: - 7/14: 124/72, HR 75 - 7/15: 122/59, HR 60 - 7/18: 144/70, HR 61   Patient denies hypotensive s/sx including dizziness, lightheadedness.    Reports occasionally has slight swelling in her legs after being on her feet all day, but that this always resolves with elevating her legs   Patient weighs herself daily   Current physical activity: reports limited since her spinal procedure in May, but planning to get back to walking more     Objective:  Lab Results  Component Value Date   CREATININE 0.97 01/08/2023   BUN 17 01/08/2023   NA 138 01/08/2023   K 4.3 01/08/2023   CL 102 01/08/2023   CO2  28 01/08/2023    Lab Results  Component Value Date   CHOL 148 07/17/2022   HDL 60.00 07/17/2022   LDLCALC 61 07/17/2022   LDLDIRECT 139.1 04/11/2012   TRIG 136.0 07/17/2022   CHOLHDL 2 07/17/2022   BP Readings from Last 3 Encounters:  02/10/23 138/86  01/12/23 (!) 144/48  01/08/23 (!) 162/44   Pulse Readings from Last 3 Encounters:  02/10/23 62  01/12/23 70  01/08/23 62     Medications Reviewed Today     Reviewed by Manuela Neptune, RPH-CPP (Pharmacist) on 03/24/23 at 1612  Med List Status: <None>   Medication Order Taking? Sig Documenting Provider Last Dose Status Informant  albuterol (VENTOLIN HFA) 108 (90 Base) MCG/ACT inhaler [id number]  Inhale 2 puffs into the lungs every 6 (six) hours as needed for wheezing or shortness of breath. Joaquim Nam, MD  Active Self  amLODipine (NORVASC) 2.5 MG tablet [id number] Yes Take 0.5 tablets (1.25 mg total) by mouth in the morning. Sondra Barges, PA-C Taking Active Self  aspirin 81 MG tablet [id number]  Take 1 tablet (81 mg total) by mouth daily. Antonieta Iba, MD  Active Self  Coenzyme Q10 (CO Q10) 100 MG CAPS [id number]  Take 100 mg by mouth daily. [provider]  Active Self  cyclobenzaprine (FLEXERIL) 5 MG tablet [id number]  Take 1-2 tablets (5-10 mg total) by mouth 3 (three) times daily as needed for muscle spasms. Julio Sicks, MD  Active   ezetimibe (ZETIA) 10 MG tablet  [id number]  Take 1 tablet (10 mg total) by mouth daily. Sondra Barges, PA-C  Active Self  furosemide (LASIX) 20 MG tablet [id number] Yes TAKE 1 TABLET BY MOUTH DAILY. Antonieta Iba, MD Taking Active   gabapentin (NEURONTIN) 400 MG capsule [id number]  Take 1 capsule (400 mg total) by mouth 3 (three) times daily as needed. Joaquim Nam, MD  Active   loratadine (CLARITIN) 10 MG tablet [id number]  Take 10 mg by mouth daily. [provider]  Active Self  metoprolol succinate (TOPROL-XL) 25 MG 24 hr tablet [id number] Yes Take 12.5 mg by mouth at  bedtime. [provider] Taking Active Self  omeprazole (PRILOSEC) 20 MG capsule [id number]  Take 1 capsule (20 mg total) by mouth 2 (two) times daily before a meal. Joaquim Nam, MD  Active Self  polyethylene glycol powder (GLYCOLAX/MIRALAX) 17 GM/SCOOP powder [id number]  Take 17 g by mouth 2 (two) times daily as needed. Joaquim Nam, MD  Active Self  potassium chloride (KLOR-CON) 10 MEQ tablet [id number] Yes Take 1 tablet (10 mEq total) by mouth daily. Sondra Barges, PA-C Taking Active Self  rosuvastatin (CRESTOR) 20 MG tablet [id number]  Take 1 tablet (20 mg total) by mouth daily. Sondra Barges, PA-C  Active Self  sertraline (ZOLOFT) 100 MG tablet [id number]  TAKE ONE AND A HALF TABLETS BY MOUTH EVERY DAY Joaquim Nam, MD  Active Self  traMADol (ULTRAM) 50 MG tablet [id number]  Take 0.5-1 tablets (25-50 mg total) by mouth every 6 (six) hours as needed for moderate pain. Julio Sicks, MD  Active   vitamin B-12 (CYANOCOBALAMIN) 1000 MCG tablet [id number]  Take 1 tablet (1,000 mcg total) by mouth daily. Joaquim Nam, MD  Active Self              Assessment/Plan:   HFpEF /Hypertension: - Have reviewed long term cardiovascular and renal outcomes of uncontrolled blood pressure - Recommended to continue to check home blood pressure and heart rate, keep a log of the results and have this record to review at medical appointments - Reviewed to continue to weigh daily and when to contact cardiology with weight gain     Follow Up Plan: Patient denies further medication questions or concerns today Provide patient with contact information for clinic pharmacist to contact if needed in future for medication questions/concerns    Estelle Grumbles, PharmD, [facility] [phone]

## 2023-03-31 ENCOUNTER — Telehealth: Payer: Medicare HMO

## 2023-03-31 ENCOUNTER — Ambulatory Visit (INDEPENDENT_AMBULATORY_CARE_PROVIDER_SITE_OTHER): Payer: Medicare HMO

## 2023-03-31 DIAGNOSIS — I10 Essential (primary) hypertension: Secondary | ICD-10-CM

## 2023-03-31 DIAGNOSIS — I110 Hypertensive heart disease with heart failure: Secondary | ICD-10-CM | POA: Diagnosis not present

## 2023-03-31 DIAGNOSIS — F419 Anxiety disorder, unspecified: Secondary | ICD-10-CM

## 2023-03-31 DIAGNOSIS — I5033 Acute on chronic diastolic (congestive) heart failure: Secondary | ICD-10-CM

## 2023-03-31 DIAGNOSIS — I5030 Unspecified diastolic (congestive) heart failure: Secondary | ICD-10-CM

## 2023-03-31 NOTE — Chronic Care Management (AMB) (Signed)
ERRor in Charting

## 2023-03-31 NOTE — Patient Instructions (Signed)
Please call the care guide team at [phone] if you need to cancel or reschedule your appointment.   If you are experiencing a Mental Health or Behavioral Health Crisis or need someone to talk to, please call the Suicide and Crisis Lifeline: 988 call the [place] National Suicide Prevention Lifeline: 858-829-3061 or TTY: 602-660-9185 TTY (712)852-1310) to talk to a trained counselor call 1-800-273-TALK (toll free, 24 hour hotline) go to [facility] [address], [place] [phone])   Following is a copy of the CCM Program Consent:  CCM service includes personalized support from designated clinical staff supervised by the physician, including individualized plan of care and coordination with other care providers 24/7 contact phone numbers for assistance for urgent and routine care needs. Service will only be billed when office clinical staff spend 20 minutes or more in a month to coordinate care. Only one practitioner may furnish and bill the service in a calendar month. The patient may stop CCM services at amy time (effective at the end of the month) by phone call to the office staff. The patient will be responsible for cost sharing (co-pay) or up to 20% of the service fee (after annual deductible is met)  Following is a copy of your full provider care plan:   Goals Addressed             This Visit's Progress    RNCM Care Management Expected Outcome:  Monitor, Self-Manage and Reduce Symptoms of Heart Failure       Current Barriers:  Knowledge Deficits related to the importance of daily weights in the effective management of HF Care Coordination needs related to poly pharmacy and resources needed for effective medication management  in a patient with HF Chronic Disease Management support and education needs related to effective management of HF Wt Readings from Last 3 Encounters:  02/10/23 139 lb (63 kg)  01/11/23 141 lb (64 kg)  01/08/23 141  lb 14.4 oz (64.4 kg)     Planned Interventions: Basic overview and discussion of pathophysiology of Heart Failure reviewed. The patient is stable. Denies any new issues with her heart failure or heart health.The patient feels like she is doing very well. The patient states that her blood pressure this am was 156/62 but it was before her medications. The patient states that after  her medications it was 131/64. She is wanting to be more active and do things but she has to be careful with her surgery she had on her neck. Feels she is getting little anxious and that she is having some panic attacks. Discussed ways to help with anxiety.  Provided education on low sodium diet. The patient states compliance with dietary restrictions Reviewed Heart Failure Action Plan in depth. The patient monitors her weight and is around 141 now. She says that she is not having any exacerbations in  her heart failure at this time. Sees specialist on a regular basis. No changes in the plan of care.   Assessed need for readable accurate scales in home. Has scales in her home Provided education about placing scale on hard, flat surface Advised patient to weigh each morning after emptying bladder Discussed importance of daily weight and advised patient to weigh and record daily. The patient states she does not weigh daily. The patient states that she weighs a couple of times a week. Education on the benefits of daily weights and monitoring for changes in her swelling/edema and monitoring for heart failure exacerbations. Ask the  patient to call the office for +2/3 pounds in one day or +5 pounds in one week. Will continue to monitor Review of sx and sx of heart failure and ask the patient to monitor for acute changes or onset:  F- Fatigue  A- Activity intolerance  C- Chest congestion or cough  E- Edema or Swelling in feet, legs, abdomen  S- Shortness of breath Reviewed role of diuretics in prevention of fluid overload and  management of heart failure. The patient takes Lasix 20 mg QD for effective management of fluid balance Discussed the importance of keeping all appointments with provider. Keeps appointments. Has upcoming appointment with the cardiologist Dr. Loleta Chance.  Provided patient with education about the role of exercise in the management of heart failure Advised patient to discuss changes in HF, acute onset of swelling or edema noted, and other sx and sx of HF with provider Screening for signs and symptoms of depression related to chronic disease state  Assessed social determinant of health barriers EF% 60 to 65% 09-15-2021  Symptom Management: Take medications as prescribed   Attend all scheduled provider appointments Call provider office for new concerns or questions  call the Suicide and Crisis Lifeline: 988 call the [place] National Suicide Prevention Lifeline: 508-658-2368 or TTY: 705 512 6599 TTY (913)410-3969) to talk to a trained counselor call 1-800-273-TALK (toll free, 24 hour hotline) if experiencing a Mental Health or Behavioral Health Crisis  call office if I gain more than 2 pounds in one day or 5 pounds in one week use salt in moderation watch for swelling in feet, ankles and legs every day weigh myself daily develop a rescue plan follow rescue plan if symptoms flare-up eat more whole grains, fruits and vegetables, lean meats and healthy fats track symptoms and what helps feel better or worse dress right for the weather, hot or cold  Follow Up Plan: Telephone follow up appointment with care management team member scheduled for: 05-26-2023 at 9 am       RNCM Care Management Expected Outcome:  Monitor, Self-Manage, and Reduce Symptoms of Hypertension       Current Barriers:  Knowledge Deficits related to consistent measurement of blood pressures and to report blood pressures that are consistently out of range Chronic Disease Management support and education needs related to effective  management of HTN BP Readings from Last 3 Encounters:  02/10/23 138/86  01/12/23 (!) 144/48  01/08/23 (!) 162/44     Planned Interventions: Evaluation of current treatment plan related to hypertension self management and patient's adherence to plan as established by provider. The patient has a cardiologist that she sees on a regular basis. States that her blood pressures are more stable post surgery on her neck. She feels she is stable and doing well. Denies any acute changes in her HTN or heart health. She says she is feeling great. Provided education to patient re: stroke prevention, s/s of heart attack and stroke; Reviewed prescribed diet heart healthy diet. Review and education provided. The patient watches her sodium content. Says that she has an appetite sometimes and sometimes she does not. She denies any issues with dietary restrictions. Reviewed medications with patient and discussed importance of compliance. The patient is compliant with medications and has worked with pharm D in the past. Ongoing support and medication management with the pharm D. Counseled on the importance of exercise goals with target of 150 minutes per week Discussed plans with patient for ongoing care management follow up and provided patient with direct contact  information for care management team; Advised patient, providing education and rationale, to monitor blood pressure daily and record, calling PCP for findings outside established parameters. The patient does take her blood pressures at home. No readings available at the time of the call. Discussed the goals of blood pressures: <150 systolic and <90 diastolic;  Reviewed scheduled/upcoming provider appointments including: Sees pcp when needed. Has upcoming appointments with Dr. Loleta Chance and Dr. Jordan Likes in June. Advised patient to discuss changes in blood pressures or heart health with provider; Provided education on prescribed diet heart healthy diet ;  Discussed  complications of poorly controlled blood pressure such as heart disease, stroke, circulatory complications, vision complications, kidney impairment, sexual dysfunction;  Screening for signs and symptoms of depression related to chronic disease state;  Assessed social determinant of health barriers;   Symptom Management: Take medications as prescribed   Attend all scheduled provider appointments Call provider office for new concerns or questions  call the Suicide and Crisis Lifeline: 988 call the [place] National Suicide Prevention Lifeline: 804-190-7401 or TTY: (432)352-5670 TTY 513-876-4019) to talk to a trained counselor call 1-800-273-TALK (toll free, 24 hour hotline) if experiencing a Mental Health or Behavioral Health Crisis  check blood pressure 3 times per week learn about high blood pressure take blood pressure log to all doctor appointments call doctor for signs and symptoms of high blood pressure develop an action plan for high blood pressure keep all doctor appointments take medications for blood pressure exactly as prescribed report new symptoms to your doctor eat more whole grains, fruits and vegetables, lean meats and healthy fats  Follow Up Plan: Telephone follow up appointment with care management team member scheduled for: 05-26-2023 at 9 am          Patient verbalizes understanding of instructions and care plan provided today and agrees to view in MyChart. Active MyChart status and patient understanding of how to access instructions and care plan via MyChart confirmed with patient.  Telephone follow up appointment with care management team member scheduled for: 05-26-2023 at 9 am

## 2023-03-31 NOTE — Chronic Care Management (AMB) (Signed)
Chronic Care Management   CCM RN Visit Note  03/31/2023 Name: Catherine Green MRN: [id number] DOB: [birth year]  Subjective: Catherine Green is a 82 y.o. year old female who is a primary care patient of Joaquim Nam, MD. The patient was referred to the Chronic Care Management team for assistance with care management needs subsequent to provider initiation of CCM services and plan of care.    Today's Visit:  Engaged with patient by telephone for follow up visit.        Goals Addressed             This Visit's Progress    RNCM Care Management Expected Outcome:  Monitor, Self-Manage and Reduce Symptoms of Heart Failure       Current Barriers:  Knowledge Deficits related to the importance of daily weights in the effective management of HF Care Coordination needs related to [facility] and resources needed for effective medication management  in a patient with HF Chronic Disease Management support and education needs related to effective management of HF Wt Readings from Last 3 Encounters:  02/10/23 139 lb (63 kg)  01/11/23 141 lb (64 kg)  01/08/23 141 lb 14.4 oz (64.4 kg)     Planned Interventions: Basic overview and discussion of pathophysiology of Heart Failure reviewed. The patient is stable. Denies any new issues with her heart failure or heart health.The patient feels like she is doing very well. The patient states that her blood pressure this am was 156/62 but it was before her medications. The patient states that after  her medications it was 131/64. She is wanting to be more active and do things but she has to be careful with her surgery she had on her neck. Feels she is getting little anxious and that she is having some panic attacks. Discussed ways to help with anxiety.  Provided education on low sodium diet. The patient states compliance with dietary restrictions Reviewed Heart Failure Action Plan in depth. The patient monitors her weight and is around 141 now. She says  that she is not having any exacerbations in  her heart failure at this time. Sees specialist on a regular basis. No changes in the plan of care.   Assessed need for readable accurate scales in home. Has scales in her home Provided education about placing scale on hard, flat surface Advised patient to weigh each morning after emptying bladder Discussed importance of daily weight and advised patient to weigh and record daily. The patient states she does not weigh daily. The patient states that she weighs a couple of times a week. Education on the benefits of daily weights and monitoring for changes in her swelling/edema and monitoring for heart failure exacerbations. Ask the patient to call the office for +2/3 pounds in one day or +5 pounds in one week. Will continue to monitor Review of sx and sx of heart failure and ask the patient to monitor for acute changes or onset:  F- Fatigue  A- Activity intolerance  C- Chest congestion or cough  E- Edema or Swelling in feet, legs, abdomen  S- Shortness of breath Reviewed role of diuretics in prevention of fluid overload and management of heart failure. The patient takes Lasix 20 mg QD for effective management of fluid balance Discussed the importance of keeping all appointments with provider. Keeps appointments. Has upcoming appointment with the cardiologist Dr. Loleta Chance.  Provided patient with education about the role of exercise in the management of heart failure Advised patient  to discuss changes in HF, acute onset of swelling or edema noted, and other sx and sx of HF with provider Screening for signs and symptoms of depression related to chronic disease state  Assessed social determinant of health barriers EF% 60 to 65% 09-15-2021  Symptom Management: Take medications as prescribed   Attend all scheduled provider appointments Call provider office for new concerns or questions  call the Suicide and Crisis Lifeline: 988 call the [place] National Suicide  Prevention Lifeline: 319-703-5860 or TTY: 734-677-2045 TTY (559)701-5366) to talk to a trained counselor call 1-800-273-TALK (toll free, 24 hour hotline) if experiencing a Mental Health or Behavioral Health Crisis  call office if I gain more than 2 pounds in one day or 5 pounds in one week use salt in moderation watch for swelling in feet, ankles and legs every day weigh myself daily develop a rescue plan follow rescue plan if symptoms flare-up eat more whole grains, fruits and vegetables, lean meats and healthy fats track symptoms and what helps feel better or worse dress right for the weather, hot or cold  Follow Up Plan: Telephone follow up appointment with care management team member scheduled for: 05-26-2023 at 9 am       RNCM Care Management Expected Outcome:  Monitor, Self-Manage, and Reduce Symptoms of Hypertension       Current Barriers:  Knowledge Deficits related to consistent measurement of blood pressures and to report blood pressures that are consistently out of range Chronic Disease Management support and education needs related to effective management of HTN BP Readings from Last 3 Encounters:  02/10/23 138/86  01/12/23 (!) 144/48  01/08/23 (!) 162/44     Planned Interventions: Evaluation of current treatment plan related to hypertension self management and patient's adherence to plan as established by provider. The patient has a cardiologist that she sees on a regular basis. States that her blood pressures are more stable post surgery on her neck. She feels she is stable and doing well. Denies any acute changes in her HTN or heart health. She says she is feeling great. Provided education to patient re: stroke prevention, s/s of heart attack and stroke; Reviewed prescribed diet heart healthy diet. Review and education provided. The patient watches her sodium content. Says that she has an appetite sometimes and sometimes she does not. She denies any issues with dietary  restrictions. Reviewed medications with patient and discussed importance of compliance. The patient is compliant with medications and has worked with pharm D in the past. Ongoing support and medication management with the pharm D. Counseled on the importance of exercise goals with target of 150 minutes per week Discussed plans with patient for ongoing care management follow up and provided patient with direct contact information for care management team; Advised patient, providing education and rationale, to monitor blood pressure daily and record, calling PCP for findings outside established parameters. The patient does take her blood pressures at home. No readings available at the time of the call. Discussed the goals of blood pressures: <150 systolic and <90 diastolic;  Reviewed scheduled/upcoming provider appointments including: Sees pcp when needed. Has upcoming appointments with Dr. Loleta Chance and Dr. Jordan Likes in June. Advised patient to discuss changes in blood pressures or heart health with provider; Provided education on prescribed diet heart healthy diet ;  Discussed complications of poorly controlled blood pressure such as heart disease, stroke, circulatory complications, vision complications, kidney impairment, sexual dysfunction;  Screening for signs and symptoms of depression related to chronic disease state;  Assessed social determinant of health barriers;   Symptom Management: Take medications as prescribed   Attend all scheduled provider appointments Call provider office for new concerns or questions  call the Suicide and Crisis Lifeline: 988 call the [place] National Suicide Prevention Lifeline: (878)767-7335 or TTY: 505-614-2332 TTY 709-423-3629) to talk to a trained counselor call 1-800-273-TALK (toll free, 24 hour hotline) if experiencing a Mental Health or Behavioral Health Crisis  check blood pressure 3 times per week learn about high blood pressure take blood pressure log to all  doctor appointments call doctor for signs and symptoms of high blood pressure develop an action plan for high blood pressure keep all doctor appointments take medications for blood pressure exactly as prescribed report new symptoms to your doctor eat more whole grains, fruits and vegetables, lean meats and healthy fats  Follow Up Plan: Telephone follow up appointment with care management team member scheduled for: 05-26-2023 at 9 am          Plan:Telephone follow up appointment with care management team member scheduled for:  05-26-2023 at 0900 am  Alto Denver RN, MSN, CCM RN Care Manager  Chronic Care Management Direct Number: [phone]

## 2023-04-15 DIAGNOSIS — M48061 Spinal stenosis, lumbar region without neurogenic claudication: Secondary | ICD-10-CM | POA: Diagnosis not present

## 2023-04-15 DIAGNOSIS — M961 Postlaminectomy syndrome, not elsewhere classified: Secondary | ICD-10-CM | POA: Diagnosis not present

## 2023-04-15 DIAGNOSIS — M4802 Spinal stenosis, cervical region: Secondary | ICD-10-CM | POA: Diagnosis not present

## 2023-05-13 DIAGNOSIS — Z6825 Body mass index (BMI) 25.0-25.9, adult: Secondary | ICD-10-CM | POA: Diagnosis not present

## 2023-05-13 DIAGNOSIS — M4802 Spinal stenosis, cervical region: Secondary | ICD-10-CM | POA: Diagnosis not present

## 2023-05-13 DIAGNOSIS — M961 Postlaminectomy syndrome, not elsewhere classified: Secondary | ICD-10-CM | POA: Diagnosis not present

## 2023-05-25 ENCOUNTER — Ambulatory Visit (INDEPENDENT_AMBULATORY_CARE_PROVIDER_SITE_OTHER): Payer: Medicare HMO

## 2023-05-25 VITALS — Ht 62.0 in | Wt 140.0 lb

## 2023-05-25 DIAGNOSIS — Z0000 Encounter for general adult medical examination without abnormal findings: Secondary | ICD-10-CM | POA: Diagnosis not present

## 2023-05-25 NOTE — Progress Notes (Signed)
Subjective:   Catherine Green is a 82 y.o. female who presents for Medicare Annual (Subsequent) preventive examination.  Visit Complete: Virtual  I connected with  Catherine Green on 05/25/23 by a audio enabled telemedicine application and verified that I am speaking with the correct person using two identifiers.  Patient Location: Home  Provider Location: Home Office  I discussed the limitations of evaluation and management by telemedicine. The patient expressed understanding and agreed to proceed.  Patient Medicare AWV questionnaire was completed by the patient on 05/25/2023; I have confirmed that all information answered by patient is correct and no changes since this date. Vital Signs: Unable to obtain new vitals due to this being a telehealth visit.  Cardiac Risk Factors include: advanced age (>82men, >60 women);dyslipidemia;hypertension     Objective:    Today's Vitals   05/25/23 0807  Weight: 140 lb (63.5 kg)  Height: 5\' 2"  (1.575 m)   Body mass index is 25.61 kg/m.     05/25/2023    8:10 AM 01/11/2023   10:39 AM 01/08/2023   11:33 AM 11/12/2022   10:14 AM 07/30/2022    2:49 PM 05/11/2022    8:33 AM 06/27/2019    9:55 AM  Advanced Directives  Does Patient Have a Medical Advance Directive? Yes Yes Yes Yes Yes Yes Yes  Type of Estate agent of [place];Living will Living will Living will Living will;Healthcare Power of Attorney Living will;Healthcare Power of [employer] Power of [place];Living will Healthcare Power of [place];Living will  Does patient want to make changes to medical advance directive?  No - Patient declined No - Patient declined    No - Patient declined  Copy of Healthcare Power of Attorney in Chart? No - copy requested No - copy requested Yes - validated most recent copy scanned in chart (See row information) No - copy requested  No - copy requested     Current Medications (verified) Outpatient Encounter Medications as of  05/25/2023  Medication Sig   albuterol (VENTOLIN HFA) 108 (90 Base) MCG/ACT inhaler Inhale 2 puffs into the lungs every 6 (six) hours as needed for wheezing or shortness of breath.   amLODipine (NORVASC) 2.5 MG tablet Take 0.5 tablets (1.25 mg total) by mouth in the morning.   aspirin 81 MG tablet Take 1 tablet (81 mg total) by mouth daily.   Coenzyme Q10 (CO Q10) 100 MG CAPS Take 100 mg by mouth daily.   cyclobenzaprine (FLEXERIL) 5 MG tablet Take 1-2 tablets (5-10 mg total) by mouth 3 (three) times daily as needed for muscle spasms.   ezetimibe (ZETIA) 10 MG tablet Take 1 tablet (10 mg total) by mouth daily.   furosemide (LASIX) 20 MG tablet TAKE 1 TABLET BY MOUTH DAILY.   gabapentin (NEURONTIN) 400 MG capsule Take 1 capsule (400 mg total) by mouth 3 (three) times daily as needed.   loratadine (CLARITIN) 10 MG tablet Take 10 mg by mouth daily.   metoprolol succinate (TOPROL-XL) 25 MG 24 hr tablet Take 12.5 mg by mouth at bedtime.   omeprazole (PRILOSEC) 20 MG capsule Take 1 capsule (20 mg total) by mouth 2 (two) times daily before a meal.   polyethylene glycol powder (GLYCOLAX/MIRALAX) 17 GM/SCOOP powder Take 17 g by mouth 2 (two) times daily as needed.   potassium chloride (KLOR-CON) 10 MEQ tablet Take 1 tablet (10 mEq total) by mouth daily.   rosuvastatin (CRESTOR) 20 MG tablet Take 1 tablet (20 mg total) by mouth daily.  sertraline (ZOLOFT) 100 MG tablet TAKE ONE AND A HALF TABLETS BY MOUTH EVERY DAY   traMADol (ULTRAM) 50 MG tablet Take 0.5-1 tablets (25-50 mg total) by mouth every 6 (six) hours as needed for moderate pain.   vitamin B-12 (CYANOCOBALAMIN) 1000 MCG tablet Take 1 tablet (1,000 mcg total) by mouth daily.   No facility-administered encounter medications on file as of 05/25/2023.    Allergies (verified) Lyrica [pregabalin], Hydrocodone, Ivp dye [iodinated contrast media], Lipitor [atorvastatin], Methylprednisolone, Oxycodone hcl, Penicillin g, Shellfish allergy, Buspar  [buspirone], Clavulanic acid, Prednisone, Codeine, Cymbalta [duloxetine hcl], Demerol, Doxycycline, Effexor [venlafaxine], Hydrochlorothiazide, Hydroxyzine, Metoprolol, Protonix [pantoprazole sodium], and Trazodone and nefazodone   History: Past Medical History:  Diagnosis Date   Allergy    hay fever   Anemia    Anxiety    Back pain    Carotid artery occlusion    Cerebrovascular disease    extracranial; occlusive   Chicken pox    Coronary artery disease    Depression    Dizziness    Dizziness    DVT (deep venous thrombosis) (HCC)    Fainting spell    GERD (gastroesophageal reflux disease)    Headache    Heart murmur    Hyperlipidemia    Hypertension    Leg pain    Mitral regurgitation    PONV (postoperative nausea and vomiting)    severe nausea and vomiting   Pre-syncope    PVD (peripheral vascular disease) (HCC)    endarterectomy by Dr. Arbie Cookey   Seasonal allergies    Shortness of breath dyspnea    wth ambulation at times   Swelling of both ankles    and abdomen; takes Lasix when needed   Thrombophlebitis    following childbirth   Ulcer    Past Surgical History:  Procedure Laterality Date   ANTERIOR CERVICAL DECOMP/DISCECTOMY FUSION N/A 01/11/2023   Procedure: Anterior Cervical Decompression/Discectomy Fusion Cervical Five-Cervical Six - Cervical Six-Cervical Seven;  Surgeon: Julio Sicks, MD;  Location: [facility] OR;  Service: Neurosurgery;  Laterality: N/A;  3C   APPENDECTOMY     BREAST SURGERY     saline implants   CARDIAC CATHETERIZATION     CAROTID ENDARTERECTOMY  1992   CAROTID ENDARTERECTOMY Right 02/13/2005   Re-do Right CE   CATARACT EXTRACTION, BILATERAL  2013   CHOLECYSTECTOMY, LAPAROSCOPIC  04/02/2014   Dr. Renda Green   CORONARY ARTERY BYPASS GRAFT  2006   x3 Dr. Donata Clay   ENDARTERECTOMY Left 07/20/2014   Procedure: ENDARTERECTOMY CAROTID-LEFT;  Surgeon: Larina Earthly, MD;  Location: [facility] OR;  Service: Vascular;  Laterality: Left;   EYE SURGERY Bilateral  10/2011   Cataract Left eye   SEPTOPLASTY     with bilateral inferior turbinate reductions   SPINE SURGERY  12/2015   TOTAL ABDOMINAL HYSTERECTOMY     UPPER GASTROINTESTINAL ENDOSCOPY     had polyps removed from esophagus   UPPER GI ENDOSCOPY     Family History  Problem Relation Age of Onset   Aneurysm Mother        brain   Heart disease Mother        Aneyursm    Hyperlipidemia Mother    Hypertension Mother    Varicose Veins Mother    Bleeding Disorder Mother    Heart disease Father    Cirrhosis Father    Heart attack Father    Hyperlipidemia Father    Hypertension Father    Cancer Sister  lung   Heart disease Sister        Aneurysm   Hyperlipidemia Sister    Hypertension Sister    Varicose Veins Sister    Bleeding Disorder Sister    Heart disease Brother        Before age 38   Aneurysm Brother    Deep vein thrombosis Brother    Birth defects Brother    Hyperlipidemia Brother    Hypertension Brother    Peripheral vascular disease Daughter    Hyperlipidemia Daughter    Hypertension Daughter    Hypertension Other    Breast cancer Neg Hx    Colon cancer Neg Hx    Social History   Socioeconomic History   Marital status: Married    Spouse name: Fayrene Fearing    Number of children: 1   Years of education: Not on file   Highest education level: 11th grade  Occupational History    Comment: Retired   Tobacco Use   Smoking status: Former    Current packs/day: 0.00    Average packs/day: 0.3 packs/day for 15.0 years (4.5 ttl pk-yrs)    Types: Cigarettes    Start date: 08/31/1981    Quit date: 08/31/1996    Years since quitting: 26.7    Passive exposure: Never   Smokeless tobacco: Never  Vaping Use   Vaping status: Never Used  Substance and Sexual Activity   Alcohol use: No    Alcohol/week: 0.0 standard drinks of alcohol   Drug use: No   Sexual activity: Not Currently  Other Topics Concern   Not on file  Social History Narrative   Lives with husband in a one  level    Work - retired Producer, television/film/video   Diet - healthy   Right handed    Caffeine- 1 cup per day.      Had 1 daughter. Daughter is deceased   Social Determinants of Health   Financial Resource Strain: Low Risk  (05/25/2023)   Overall Financial Resource Strain (CARDIA)    Difficulty of Paying Living Expenses: Not hard at all  Food Insecurity: No Food Insecurity (05/25/2023)   Hunger Vital Sign    Worried About Running Out of Food in the Last Year: Never true    Ran Out of Food in the Last Year: Never true  Transportation Needs: No Transportation Needs (05/25/2023)   PRAPARE - Administrator, Civil Service (Medical): No    Lack of Transportation (Non-Medical): No  Physical Activity: Sufficiently Active (05/25/2023)   Exercise Vital Sign    Days of Exercise per Week: 5 days    Minutes of Exercise per Session: 30 min  Stress: No Stress Concern Present (05/25/2023)   [employer] of Occupational Health - Occupational Stress Questionnaire    Feeling of Stress : Not at all  Social Connections: Moderately Integrated (05/25/2023)   Social Connection and Isolation Panel [NHANES]    Frequency of Communication with Friends and Family: More than three times a week    Frequency of Social Gatherings with Friends and Family: More than three times a week    Attends Religious Services: More than 4 times per year    Active Member of [employer] or Organizations: No    Attends Banker Meetings: Never    Marital Status: Married    Tobacco Counseling Counseling given: Not Answered   Clinical Intake:  Pre-visit preparation completed: Yes  Pain : No/denies pain     Nutritional Risks: None  Diabetes: No  How often do you need to have someone help you when you read instructions, pamphlets, or other written materials from your doctor or pharmacy?: 1 - Never  Interpreter Needed?: No  Information entered by :: Renie Ora, LPN   Activities of Daily Living    05/25/2023     8:11 AM 01/08/2023   11:36 AM  In your present state of health, do you have any difficulty performing the following activities:  Hearing? 0   Vision? 0   Difficulty concentrating or making decisions? 0   Walking or climbing stairs? 0   Dressing or bathing? 0   Doing errands, shopping? 0 0  Preparing Food and eating ? N   Using the Toilet? N   In the past six months, have you accidently leaked urine? N   Do you have problems with loss of bowel control? N   Managing your Medications? N   Managing your Finances? N   Housekeeping or managing your Housekeeping? N     Patient Care Team: Joaquim Nam, MD as PCP - General (Family Medicine) Mariah Milling Tollie Pizza, MD as PCP - Cardiology (Cardiology) Antonieta Iba, MD (Cardiology) Julio Sicks, MD as Consulting Physician (Neurosurgery) Marlowe Sax, RN as Case Manager (General Practice) Antony Madura, MD as Consulting Physician (Neurology)  Indicate any recent Medical Services you may have received from other than [facility] providers in the past year (date may be approximate).     Assessment:   This is a routine wellness examination for Tilda.  Hearing/Vision screen Vision Screening - Comments:: Wears rx glasses - up to date with routine eye exams with  Dr.Mequen    Goals Addressed             This Visit's Progress    DIET - INCREASE WATER INTAKE         Depression Screen    05/25/2023    8:09 AM 10/26/2022    3:05 PM 09/09/2022    9:23 AM 05/11/2022    8:36 AM 05/10/2021   10:48 AM 05/10/2021   10:14 AM 05/10/2021   10:07 AM  PHQ 2/9 Scores  PHQ - 2 Score 0 0 0 0 0 0 0  PHQ- 9 Score  6   0 0 0    Fall Risk    05/25/2023    8:07 AM 11/12/2022   10:14 AM 11/04/2022    9:33 AM 10/26/2022    3:05 PM 09/09/2022    9:33 AM  Fall Risk   Falls in the past year? 0 0 1 1 1   Number falls in past yr: 0 0 0 0 0  Injury with Fall? 0 0 0 0 1  Risk for fall due to : No Fall Risks  History of fall(s) No Fall Risks History of  fall(s);Impaired balance/gait;Orthopedic patient  Follow up Falls prevention discussed Falls evaluation completed Falls evaluation completed;Education provided;Falls prevention discussed Falls evaluation completed Falls evaluation completed;Education provided;Falls prevention discussed    MEDICARE RISK AT HOME: Medicare Risk at Home Any stairs in or around the home?: No If so, are there any without handrails?: No Home free of loose throw rugs in walkways, pet beds, electrical cords, etc?: Yes Adequate lighting in your home to reduce risk of falls?: Yes Life alert?: No Use of a cane, walker or w/c?: No Grab bars in the bathroom?: Yes Shower chair or bench in shower?: Yes Elevated toilet seat or a handicapped toilet?: Yes  TIMED UP AND  GO:  Was the test performed?  No    Cognitive Function:    01/31/2019    8:31 AM 01/11/2018    9:16 AM 01/07/2017   11:01 AM  MMSE - Mini Mental State Exam  Orientation to time 5 5 5   Orientation to Place 5 5 5   Registration 3 3 3   Attention/ Calculation 0 0 0  Recall 3 3 3   Language- name 2 objects 0 0 0  Language- repeat 1 1 1   Language- follow 3 step command 0 3 3  Language- read & follow direction 0 0 0  Write a sentence 0 0 0  Copy design 0 0 0  Total score 17 20 20         05/25/2023    8:11 AM 05/11/2022    8:37 AM  6CIT Screen  What Year? 0 points 0 points  What month? 0 points 0 points  What time? 0 points 0 points  Count back from 20 0 points 0 points  Months in reverse 0 points 0 points  Repeat phrase 0 points 0 points  Total Score 0 points 0 points    Immunizations Immunization History  Administered Date(s) Administered   Fluad Quad(high Dose 65+) 05/11/2019, 07/08/2020   Influenza Split 05/27/2012   Influenza Whole 06/20/2013   Influenza, High Dose Seasonal PF 05/27/2018   Influenza, Seasonal, Injecte, Preservative Fre 06/24/2016   Influenza,inj,Quad PF,6+ Mos 06/14/2014, 06/07/2015, 05/24/2017   Influenza-Unspecified  07/14/2022   Moderna Covid-19 Fall Seasonal Vaccine 59yrs & older 08/08/2022   PFIZER(Purple Top)SARS-COV-2 Vaccination 09/05/2019, 09/26/2019, 06/12/2020   Pfizer Covid-19 Vaccine Bivalent Booster 51yrs & up 10/16/2021   Pneumococcal Conjugate-13 01/07/2017   Pneumococcal Polysaccharide-23 03/11/2011   Tdap 05/27/2012   Zoster, Live 03/11/2011    TDAP status: Due, Education has been provided regarding the importance of this vaccine. Advised may receive this vaccine at local pharmacy or Health Dept. Aware to provide a copy of the vaccination record if obtained from local pharmacy or Health Dept. Verbalized acceptance and understanding.  Flu Vaccine status: Due, Education has been provided regarding the importance of this vaccine. Advised may receive this vaccine at local pharmacy or Health Dept. Aware to provide a copy of the vaccination record if obtained from local pharmacy or Health Dept. Verbalized acceptance and understanding.  Pneumococcal vaccine status: Up to date  Covid-19 vaccine status: Completed vaccines  Qualifies for Shingles Vaccine? Yes   Zostavax completed No   Shingrix Completed?: No.    Education has been provided regarding the importance of this vaccine. Patient has been advised to call insurance company to determine out of pocket expense if they have not yet received this vaccine. Advised may also receive vaccine at local pharmacy or Health Dept. Verbalized acceptance and understanding.  Screening Tests Health Maintenance  Topic Date Due   Zoster Vaccines- Shingrix (1 of 2) 03/12/1990   DTaP/Tdap/Td (2 - Td or Tdap) 05/27/2022   INFLUENZA VACCINE  04/01/2023   COVID-19 Vaccine (6 - 2023-24 season) 05/02/2023   MAMMOGRAM  01/07/2026 (Originally 03/10/2012)   DEXA SCAN  01/07/2026 (Originally 03/12/2005)   Medicare Annual Wellness (AWV)  05/24/2024   Pneumonia Vaccine 54+ Years old  Completed   HPV VACCINES  Aged Out    Health Maintenance  Health Maintenance Due   Topic Date Due   Zoster Vaccines- Shingrix (1 of 2) 03/12/1990   DTaP/Tdap/Td (2 - Td or Tdap) 05/27/2022   INFLUENZA VACCINE  04/01/2023   COVID-19 Vaccine (6 -  2023-24 season) 05/02/2023    Colorectal cancer screening: No longer required.   Mammogram status: No longer required due to age.  Bone Density status: Ordered patient declined . Pt provided with contact info and advised to call to schedule appt.  Lung Cancer Screening: (Low Dose CT Chest recommended if Age 64-80 years, 20 pack-year currently smoking OR have quit w/in 15years.) does not qualify.   Lung Cancer Screening Referral: n/a  Additional Screening:  Hepatitis C Screening: does not qualify;   Vision Screening: Recommended annual ophthalmology exams for early detection of glaucoma and other disorders of the eye. Is the patient up to date with their annual eye exam?  Yes  Who is the provider or what is the name of the office in which the patient attends annual eye exams? Dr.McQuen  If pt is not established with a provider, would they like to be referred to a provider to establish care? No .   Dental Screening: Recommended annual dental exams for proper oral hygiene   Community Resource Referral / Chronic Care Management: CRR required this visit?  No   CCM required this visit?  No     Plan:     I have personally reviewed and noted the following in the patient's chart:   Medical and social history Use of alcohol, tobacco or illicit drugs  Current medications and supplements including opioid prescriptions. Patient is not currently taking opioid prescriptions. Functional ability and status Nutritional status Physical activity Advanced directives List of other physicians Hospitalizations, surgeries, and ER visits in previous 12 months Vitals Screenings to include cognitive, depression, and falls Referrals and appointments  In addition, I have reviewed and discussed with patient certain preventive  protocols, quality metrics, and best practice recommendations. A written personalized care plan for preventive services as well as general preventive health recommendations were provided to patient.     Lorrene Reid, LPN   10/29/6008   After Visit Summary: (MyChart) Due to this being a telephonic visit, the after visit summary with patients personalized plan was offered to patient via MyChart   Nurse Notes: none

## 2023-05-25 NOTE — Patient Instructions (Signed)
Catherine Green , Thank you for taking time to come for your Medicare Wellness Visit. I appreciate your ongoing commitment to your health goals. Please review the following plan we discussed and let me know if I can assist you in the future.   Referrals/Orders/Follow-Ups/Clinician Recommendations: Aim for 30 minutes of exercise or brisk walking, 6-8 glasses of water, and 5 servings of fruits and vegetables each day.   This is a list of the screening recommended for you and due dates:  Health Maintenance  Topic Date Due   Zoster (Shingles) Vaccine (1 of 2) 03/12/1990   DTaP/Tdap/Td vaccine (2 - Td or Tdap) 05/27/2022   Flu Shot  04/01/2023   COVID-19 Vaccine (6 - 2023-24 season) 05/02/2023   Mammogram  01/07/2026*   DEXA scan (bone density measurement)  01/07/2026*   Medicare Annual Wellness Visit  05/24/2024   Pneumonia Vaccine  Completed   HPV Vaccine  Aged Out  *Topic was postponed. The date shown is not the original due date.    Advanced directives: (Copy Requested) Please bring a copy of your health care power of attorney and living will to the office to be added to your chart at your convenience.  Next Medicare Annual Wellness Visit scheduled for next year: Yes  Insert Preventive Care attachment Insert FALL PREVENTION attachment if needed

## 2023-05-26 ENCOUNTER — Other Ambulatory Visit: Payer: Self-pay

## 2023-05-26 ENCOUNTER — Other Ambulatory Visit: Payer: Medicare HMO

## 2023-05-26 DIAGNOSIS — H6123 Impacted cerumen, bilateral: Secondary | ICD-10-CM | POA: Diagnosis not present

## 2023-05-26 DIAGNOSIS — H903 Sensorineural hearing loss, bilateral: Secondary | ICD-10-CM | POA: Diagnosis not present

## 2023-05-26 DIAGNOSIS — J300 Vasomotor rhinitis: Secondary | ICD-10-CM | POA: Diagnosis not present

## 2023-05-26 NOTE — Patient Instructions (Signed)
Visit Information  Thank you for taking time to visit with me today. Please don't hesitate to contact me if I can be of assistance to you before our next scheduled telephone appointment.  Following are the goals we discussed today:   Goals Addressed             This Visit's Progress    RNCM Care Management Expected Outcome:  Monitor, Self-Manage and Reduce Symptoms of Heart Failure       Current Barriers:  Knowledge Deficits related to the importance of daily weights in the effective management of HF Care Coordination needs related to [facility] pharmacy and resources needed for effective medication management  in a patient with HF Chronic Disease Management support and education needs related to effective management of HF Wt Readings from Last 3 Encounters:  05/25/23 140 lb (63.5 kg)  02/10/23 139 lb (63 kg)  01/11/23 141 lb (64 kg)     Planned Interventions: Basic overview and discussion of pathophysiology of Heart Failure reviewed. The patient is stable. Denies any new issues with her heart failure or heart health.The patient feels like she is doing very well. The patient is doing well. She states that she has some swelling but that is if she is up on her feet a lot. She denies any acute changes in her heart failure or  heart health. Will continue to monitor.  Provided education on low sodium diet. The patient states compliance with dietary restrictions Reviewed Heart Failure Action Plan in depth. The patient monitors her weight and is around 140 now. She says that she is not having any exacerbations in  her heart failure at this time. Sees specialist on a regular basis. No changes in the plan of care.   Assessed need for readable accurate scales in home. Has scales in her home Provided education about placing scale on hard, flat surface Advised patient to weigh each morning after emptying bladder Discussed importance of daily weight and advised patient to weigh and record daily. The  patient states she does not weigh daily. The patient states that she weighs a couple of times a week. Education on the benefits of daily weights and monitoring for changes in her swelling/edema and monitoring for heart failure exacerbations. Ask the patient to call the office for +2/3 pounds in one day or +5 pounds in one week. Will continue to monitor Review of sx and sx of heart failure and ask the patient to monitor for acute changes or onset:  F- Fatigue  A- Activity intolerance  C- Chest congestion or cough  E- Edema or Swelling in feet, legs, abdomen  S- Shortness of breath Reviewed role of diuretics in prevention of fluid overload and management of heart failure. The patient takes Lasix 20 mg QD for effective management of fluid balance Discussed the importance of keeping all appointments with provider. Keeps appointments. She has seen Dr. Loleta Chance her cardiologist recently. She knows to call for changes or new needs. Provided patient with education about the role of exercise in the management of heart failure Advised patient to discuss changes in HF, acute onset of swelling or edema noted, and other sx and sx of HF with provider Screening for signs and symptoms of depression related to chronic disease state  Assessed social determinant of health barriers EF% 60 to 65% 09-15-2021  Symptom Management: Take medications as prescribed   Attend all scheduled provider appointments Call provider office for new concerns or questions  call the Suicide and Crisis  Lifeline: 988 call the [place] National Suicide Prevention Lifeline: 717-415-9077 or TTY: 936-823-5242 TTY 712-344-5981) to talk to a trained counselor call 1-800-273-TALK (toll free, 24 hour hotline) if experiencing a Mental Health or Behavioral Health Crisis  call office if I gain more than 2 pounds in one day or 5 pounds in one week use salt in moderation watch for swelling in feet, ankles and legs every day weigh myself daily develop a  rescue plan follow rescue plan if symptoms flare-up eat more whole grains, fruits and vegetables, lean meats and healthy fats track symptoms and what helps feel better or worse dress right for the weather, hot or cold  Follow Up Plan: Telephone follow up appointment with care management team member scheduled for: 08-18-2023 at 9 am       RNCM Care Management Expected Outcome:  Monitor, Self-Manage, and Reduce Symptoms of Hypertension       Current Barriers:  Knowledge Deficits related to consistent measurement of blood pressures and to report blood pressures that are consistently out of range Chronic Disease Management support and education needs related to effective management of HTN BP Readings from Last 3 Encounters:  02/10/23 138/86  01/12/23 (!) 144/48  01/08/23 (!) 162/44     Planned Interventions: Evaluation of current treatment plan related to hypertension self management and patient's adherence to plan as established by provider. The patient has a cardiologist that she sees on a regular basis. States that her blood pressures are more stable post surgery on her neck. She feels she is stable and doing well. Denies any acute changes in her HTN or heart health. She says she is feeling great. She denies any pain with her cervical spine and feels she has recovered well. She is happy and doing things she enjoys doing. Will continue to monitor for changes or new needs. Provided education to patient re: stroke prevention, s/s of heart attack and stroke; Reviewed prescribed diet heart healthy diet. Review and education provided. The patient watches her sodium content. Says that she has an appetite sometimes and sometimes she does not. She denies any issues with dietary restrictions. Reviewed medications with patient and discussed importance of compliance. The patient is compliant with medications and has worked with pharm D in the past. Ongoing support and medication management with the pharm  D. Counseled on the importance of exercise goals with target of 150 minutes per week Discussed plans with patient for ongoing care management follow up and provided patient with direct contact information for care management team; Advised patient, providing education and rationale, to monitor blood pressure daily and record, calling PCP for findings outside established parameters. The patient does take her blood pressures at home. No readings available at the time of the call. Discussed the goals of blood pressures: <150 systolic and <90 diastolic;  Reviewed scheduled/upcoming provider appointments including: Sees pcp when needed. Sees specialist on a regular basis.  Advised patient to discuss changes in blood pressures or heart health with provider; Provided education on prescribed diet heart healthy diet ;  Discussed complications of poorly controlled blood pressure such as heart disease, stroke, circulatory complications, vision complications, kidney impairment, sexual dysfunction;  Screening for signs and symptoms of depression related to chronic disease state;  Assessed social determinant of health barriers;   Symptom Management: Take medications as prescribed   Attend all scheduled provider appointments Call provider office for new concerns or questions  call the Suicide and Crisis Lifeline: 988 call the [place] National Suicide Prevention Lifeline:  251-256-5869 or TTY: 979-031-5304 TTY 910-425-0745) to talk to a trained counselor call 1-800-273-TALK (toll free, 24 hour hotline) if experiencing a Mental Health or Behavioral Health Crisis  check blood pressure 3 times per week learn about high blood pressure take blood pressure log to all doctor appointments call doctor for signs and symptoms of high blood pressure develop an action plan for high blood pressure keep all doctor appointments take medications for blood pressure exactly as prescribed report new symptoms to your doctor eat  more whole grains, fruits and vegetables, lean meats and healthy fats  Follow Up Plan: Telephone follow up appointment with care management team member scheduled for: 08-18-2023 at 9 am           Our next appointment is by telephone on 08-18-2023 at 0900 am  Please call the care guide team at [phone] if you need to cancel or reschedule your appointment.   If you are experiencing a Mental Health or Behavioral Health Crisis or need someone to talk to, please call the Suicide and Crisis Lifeline: 988 call the [place] National Suicide Prevention Lifeline: 818-676-4367 or TTY: (435)125-6473 TTY 954-255-7849) to talk to a trained counselor call 1-800-273-TALK (toll free, 24 hour hotline) go to [facility] [address], [place] [phone])   Patient verbalizes understanding of instructions and care plan provided today and agrees to view in MyChart. Active MyChart status and patient understanding of how to access instructions and care plan via MyChart confirmed with patient.     Telephone follow up appointment with care management team member scheduled for: 08-18-2023 at 9 am  Alto Denver RN, MSN, CCM RN Care Manager  [facility]  Ambulatory Care Management  Direct Number: [phone]

## 2023-05-26 NOTE — Patient Outreach (Signed)
Care Management   Visit Note  05/26/2023 Name: Catherine Green MRN: [id number] DOB: [birth year]  Subjective: Catherine Green is a 82 y.o. year old female who is a primary care patient of Catherine Nam, MD. The Care Management team was consulted for assistance.      Engaged with patient spoke with patient by telephone.    Goals Addressed             This Visit's Progress    RNCM Care Management Expected Outcome:  Monitor, Self-Manage and Reduce Symptoms of Heart Failure       Current Barriers:  Knowledge Deficits related to the importance of daily weights in the effective management of HF Care Coordination needs related to [facility] pharmacy and resources needed for effective medication management  in a patient with HF Chronic Disease Management support and education needs related to effective management of HF Wt Readings from Last 3 Encounters:  05/25/23 140 lb (63.5 kg)  02/10/23 139 lb (63 kg)  01/11/23 141 lb (64 kg)     Planned Interventions: Basic overview and discussion of pathophysiology of Heart Failure reviewed. The patient is stable. Denies any new issues with her heart failure or heart health.The patient feels like she is doing very well. The patient is doing well. She states that she has some swelling but that is if she is up on her feet a lot. She denies any acute changes in her heart failure or  heart health. Will continue to monitor.  Provided education on low sodium diet. The patient states compliance with dietary restrictions Reviewed Heart Failure Action Plan in depth. The patient monitors her weight and is around 140 now. She says that she is not having any exacerbations in  her heart failure at this time. Sees specialist on a regular basis. No changes in the plan of care.   Assessed need for readable accurate scales in home. Has scales in her home Provided education about placing scale on hard, flat surface Advised patient to weigh each morning after emptying  bladder Discussed importance of daily weight and advised patient to weigh and record daily. The patient states she does not weigh daily. The patient states that she weighs a couple of times a week. Education on the benefits of daily weights and monitoring for changes in her swelling/edema and monitoring for heart failure exacerbations. Ask the patient to call the office for +2/3 pounds in one day or +5 pounds in one week. Will continue to monitor Review of sx and sx of heart failure and ask the patient to monitor for acute changes or onset:  F- Fatigue  A- Activity intolerance  C- Chest congestion or cough  E- Edema or Swelling in feet, legs, abdomen  S- Shortness of breath Reviewed role of diuretics in prevention of fluid overload and management of heart failure. The patient takes Lasix 20 mg QD for effective management of fluid balance Discussed the importance of keeping all appointments with provider. Keeps appointments. She has seen Dr. Loleta Green her cardiologist recently. She knows to call for changes or new needs. Provided patient with education about the role of exercise in the management of heart failure Advised patient to discuss changes in HF, acute onset of swelling or edema noted, and other sx and sx of HF with provider Screening for signs and symptoms of depression related to chronic disease state  Assessed social determinant of health barriers EF% 60 to 65% 09-15-2021  Symptom Management: Take medications as prescribed  Attend all scheduled provider appointments Call provider office for new concerns or questions  call the Suicide and Crisis Lifeline: 988 call the [place] National Suicide Prevention Lifeline: (435)425-3747 or TTY: (828)644-2114 TTY 9183606654) to talk to a trained counselor call 1-800-273-TALK (toll free, 24 hour hotline) if experiencing a Mental Health or Behavioral Health Crisis  call office if I gain more than 2 pounds in one day or 5 pounds in one week use salt in  moderation watch for swelling in feet, ankles and legs every day weigh myself daily develop a rescue plan follow rescue plan if symptoms flare-up eat more whole grains, fruits and vegetables, lean meats and healthy fats track symptoms and what helps feel better or worse dress right for the weather, hot or cold  Follow Up Plan: Telephone follow up appointment with care management team member scheduled for: 08-18-2023 at 9 am       RNCM Care Management Expected Outcome:  Monitor, Self-Manage, and Reduce Symptoms of Hypertension       Current Barriers:  Knowledge Deficits related to consistent measurement of blood pressures and to report blood pressures that are consistently out of range Chronic Disease Management support and education needs related to effective management of HTN BP Readings from Last 3 Encounters:  02/10/23 138/86  01/12/23 (!) 144/48  01/08/23 (!) 162/44     Planned Interventions: Evaluation of current treatment plan related to hypertension self management and patient's adherence to plan as established by provider. The patient has a cardiologist that she sees on a regular basis. States that her blood pressures are more stable post surgery on her neck. She feels she is stable and doing well. Denies any acute changes in her HTN or heart health. She says she is feeling great. She denies any pain with her cervical spine and feels she has recovered well. She is happy and doing things she enjoys doing. Will continue to monitor for changes or new needs. Provided education to patient re: stroke prevention, s/s of heart attack and stroke; Reviewed prescribed diet heart healthy diet. Review and education provided. The patient watches her sodium content. Says that she has an appetite sometimes and sometimes she does not. She denies any issues with dietary restrictions. Reviewed medications with patient and discussed importance of compliance. The patient is compliant with medications  and has worked with pharm D in the past. Ongoing support and medication management with the pharm D. Counseled on the importance of exercise goals with target of 150 minutes per week Discussed plans with patient for ongoing care management follow up and provided patient with direct contact information for care management team; Advised patient, providing education and rationale, to monitor blood pressure daily and record, calling PCP for findings outside established parameters. The patient does take her blood pressures at home. No readings available at the time of the call. Discussed the goals of blood pressures: <150 systolic and <90 diastolic;  Reviewed scheduled/upcoming provider appointments including: Sees pcp when needed. Sees specialist on a regular basis.  Advised patient to discuss changes in blood pressures or heart health with provider; Provided education on prescribed diet heart healthy diet ;  Discussed complications of poorly controlled blood pressure such as heart disease, stroke, circulatory complications, vision complications, kidney impairment, sexual dysfunction;  Screening for signs and symptoms of depression related to chronic disease state;  Assessed social determinant of health barriers;   Symptom Management: Take medications as prescribed   Attend all scheduled provider appointments Call provider office for  new concerns or questions  call the Suicide and Crisis Lifeline: 988 call the [place] National Suicide Prevention Lifeline: 9412689550 or TTY: 7822606114 TTY (332)762-7716) to talk to a trained counselor call 1-800-273-TALK (toll free, 24 hour hotline) if experiencing a Mental Health or Behavioral Health Crisis  check blood pressure 3 times per week learn about high blood pressure take blood pressure log to all doctor appointments call doctor for signs and symptoms of high blood pressure develop an action plan for high blood pressure keep all doctor  appointments take medications for blood pressure exactly as prescribed report new symptoms to your doctor eat more whole grains, fruits and vegetables, lean meats and healthy fats  Follow Up Plan: Telephone follow up appointment with care management team member scheduled for: 08-18-2023 at 9 am           Consent to Services:  Patient was given information about care management services, agreed to services, and gave verbal consent to participate.   Plan: Telephone follow up appointment with care management team member scheduled for: 08-18-2023 at 0900 am  Alto Denver RN, MSN, CCM RN Care Manager  [facility]  Ambulatory Care Management  Direct Number: [phone]

## 2023-06-29 DIAGNOSIS — H5211 Myopia, right eye: Secondary | ICD-10-CM | POA: Diagnosis not present

## 2023-06-29 DIAGNOSIS — H353111 Nonexudative age-related macular degeneration, right eye, early dry stage: Secondary | ICD-10-CM | POA: Diagnosis not present

## 2023-06-29 DIAGNOSIS — H5712 Ocular pain, left eye: Secondary | ICD-10-CM | POA: Diagnosis not present

## 2023-06-29 DIAGNOSIS — Z961 Presence of intraocular lens: Secondary | ICD-10-CM | POA: Diagnosis not present

## 2023-08-03 ENCOUNTER — Other Ambulatory Visit: Payer: Self-pay

## 2023-08-13 ENCOUNTER — Ambulatory Visit: Payer: Medicare HMO | Admitting: Neurology

## 2023-08-18 ENCOUNTER — Ambulatory Visit: Payer: Self-pay

## 2023-08-18 ENCOUNTER — Other Ambulatory Visit: Payer: Medicare HMO

## 2023-08-18 NOTE — Patient Outreach (Signed)
Care Coordination   Initial Visit Note   08/18/2023 Name: Catherine Green MRN: [id number] DOB: [birth year]  Catherine Green is a 82 y.o. year old female who sees Joaquim Nam, MD for primary care. I spoke with  [name] by phone today.  What matters to the patients health and wellness today?  Patient denies any heart failure symptoms. She reports weighing daily and states today's weight is 138 lbs.  Patient reports having symptoms of bilateral leg weakness. She states her legs give way on her and she has near falls. Patient states her husband is able to catch her before she falls to the ground.  Patient reports having cervical surgery in May 2024   Goals Addressed             This Visit's Progress    Management and education of health conditions.       Interventions Today    Flowsheet Row Most Recent Value  Chronic Disease   Chronic disease during today's visit Congestive Heart Failure (CHF), Other  [bilateral leg weakness/ falls]  General Interventions   General Interventions Discussed/Reviewed General Interventions Discussed, Doctor Visits  [evaluation of current treatment plan for HF/ bilateral leg weakness/ falls and patients adherence to plan as established by provider. Assessed for HF symptoms and bilateral leg weakness]  Doctor Visits Discussed/Reviewed Doctor Visits Discussed  [Advised patient to follow up with her primary care provider regarding increase symptoms of leg weakness/ near falls.  Primary care visit arranged for 09/20/23 at 12:30 pm.  Patient notified of appointment date and time.]  Education Interventions   Education Provided Provided Education, Provided Printed Education  [reviewed signs/ symptoms of heart failure. Advised to weigh daily and record. Heart failure action plan discussed.]  Provided Verbal Education On When to see the doctor  [Advised to notify provider for increase HF symptoms: fatigue, SOB, swelling in feet/ankles/legs, and increase weight  gain of 3 lbs overnight or 5 lbs in a week.]  Pharmacy Interventions   Pharmacy Dicussed/Reviewed Pharmacy Topics Discussed  [medication list reviewed and compliance discussed.]  Safety Interventions   Safety Discussed/Reviewed Safety Discussed, Fall Risk  [Fall prevention discussed. Education article on fall prevention sent to patient.]           COMPLETED: RNCM Care Management Expected Outcome:  Monitor, Self-Manage and Reduce Symptoms of Heart Failure       Current Barriers:  Knowledge Deficits related to the importance of daily weights in the effective management of HF Care Coordination needs related to [facility] and resources needed for effective medication management  in a patient with HF Chronic Disease Management support and education needs related to effective management of HF Wt Readings from Last 3 Encounters:  05/25/23 140 lb (63.5 kg)  02/10/23 139 lb (63 kg)  01/11/23 141 lb (64 kg)     Planned Interventions: Basic overview and discussion of pathophysiology of Heart Failure reviewed. The patient is stable. Denies any new issues with her heart failure or heart health.The patient feels like she is doing very well. The patient is doing well. She states that she has some swelling but that is if she is up on her feet a lot. She denies any acute changes in her heart failure or  heart health. Will continue to monitor.  Provided education on low sodium diet. The patient states compliance with dietary restrictions Reviewed Heart Failure Action Plan in depth. The patient monitors her weight and is around 140 now. She says  that she is not having any exacerbations in  her heart failure at this time. Sees specialist on a regular basis. No changes in the plan of care.   Assessed need for readable accurate scales in home. Has scales in her home Provided education about placing scale on hard, flat surface Advised patient to weigh each morning after emptying bladder Discussed importance  of daily weight and advised patient to weigh and record daily. The patient states she does not weigh daily. The patient states that she weighs a couple of times a week. Education on the benefits of daily weights and monitoring for changes in her swelling/edema and monitoring for heart failure exacerbations. Ask the patient to call the office for +2/3 pounds in one day or +5 pounds in one week. Will continue to monitor Review of sx and sx of heart failure and ask the patient to monitor for acute changes or onset:  F- Fatigue  A- Activity intolerance  C- Chest congestion or cough  E- Edema or Swelling in feet, legs, abdomen  S- Shortness of breath Reviewed role of diuretics in prevention of fluid overload and management of heart failure. The patient takes Lasix 20 mg QD for effective management of fluid balance Discussed the importance of keeping all appointments with provider. Keeps appointments. She has seen Dr. Loleta Chance her cardiologist recently. She knows to call for changes or new needs. Provided patient with education about the role of exercise in the management of heart failure Advised patient to discuss changes in HF, acute onset of swelling or edema noted, and other sx and sx of HF with provider Screening for signs and symptoms of depression related to chronic disease state  Assessed social determinant of health barriers EF% 60 to 65% 09-15-2021  Symptom Management: Take medications as prescribed   Attend all scheduled provider appointments Call provider office for new concerns or questions  call the Suicide and Crisis Lifeline: 988 call the [place] National Suicide Prevention Lifeline: 270-607-4557 or TTY: (901)606-9143 TTY 956 811 7106) to talk to a trained counselor call 1-800-273-TALK (toll free, 24 hour hotline) if experiencing a Mental Health or Behavioral Health Crisis  call office if I gain more than 2 pounds in one day or 5 pounds in one week use salt in moderation watch for  swelling in feet, ankles and legs every day weigh myself daily develop a rescue plan follow rescue plan if symptoms flare-up eat more whole grains, fruits and vegetables, lean meats and healthy fats track symptoms and what helps feel better or worse dress right for the weather, hot or cold  Follow Up Plan: Telephone follow up appointment with care management team member scheduled for: 08-18-2023 at 9 am       COMPLETED: [facility] Care Management Expected Outcome:  Monitor, Self-Manage, and Reduce Symptoms of Hypertension       Current Barriers:  Knowledge Deficits related to consistent measurement of blood pressures and to report blood pressures that are consistently out of range Chronic Disease Management support and education needs related to effective management of HTN BP Readings from Last 3 Encounters:  02/10/23 138/86  01/12/23 (!) 144/48  01/08/23 (!) 162/44     Planned Interventions: Evaluation of current treatment plan related to hypertension self management and patient's adherence to plan as established by provider. The patient has a cardiologist that she sees on a regular basis. States that her blood pressures are more stable post surgery on her neck. She feels she is stable and doing well. Denies any  acute changes in her HTN or heart health. She says she is feeling great. She denies any pain with her cervical spine and feels she has recovered well. She is happy and doing things she enjoys doing. Will continue to monitor for changes or new needs. Provided education to patient re: stroke prevention, s/s of heart attack and stroke; Reviewed prescribed diet heart healthy diet. Review and education provided. The patient watches her sodium content. Says that she has an appetite sometimes and sometimes she does not. She denies any issues with dietary restrictions. Reviewed medications with patient and discussed importance of compliance. The patient is compliant with medications and has  worked with pharm D in the past. Ongoing support and medication management with the pharm D. Counseled on the importance of exercise goals with target of 150 minutes per week Discussed plans with patient for ongoing care management follow up and provided patient with direct contact information for care management team; Advised patient, providing education and rationale, to monitor blood pressure daily and record, calling PCP for findings outside established parameters. The patient does take her blood pressures at home. No readings available at the time of the call. Discussed the goals of blood pressures: <150 systolic and <90 diastolic;  Reviewed scheduled/upcoming provider appointments including: Sees pcp when needed. Sees specialist on a regular basis.  Advised patient to discuss changes in blood pressures or heart health with provider; Provided education on prescribed diet heart healthy diet ;  Discussed complications of poorly controlled blood pressure such as heart disease, stroke, circulatory complications, vision complications, kidney impairment, sexual dysfunction;  Screening for signs and symptoms of depression related to chronic disease state;  Assessed social determinant of health barriers;   Symptom Management: Take medications as prescribed   Attend all scheduled provider appointments Call provider office for new concerns or questions  call the Suicide and Crisis Lifeline: 988 call the [place] National Suicide Prevention Lifeline: (630)234-4925 or TTY: 615 441 4054 TTY 845-476-9789) to talk to a trained counselor call 1-800-273-TALK (toll free, 24 hour hotline) if experiencing a Mental Health or Behavioral Health Crisis  check blood pressure 3 times per week learn about high blood pressure take blood pressure log to all doctor appointments call doctor for signs and symptoms of high blood pressure develop an action plan for high blood pressure keep all doctor appointments take  medications for blood pressure exactly as prescribed report new symptoms to your doctor eat more whole grains, fruits and vegetables, lean meats and healthy fats  Follow Up Plan: Telephone follow up appointment with care management team member scheduled for: 08-18-2023 at 9 am          SDOH assessments and interventions completed:  Yes  SDOH Interventions Today    Flowsheet Row Most Recent Value  SDOH Interventions   Food Insecurity Interventions Intervention Not Indicated  Housing Interventions Intervention Not Indicated  Transportation Interventions Intervention Not Indicated  Utilities Interventions Intervention Not Indicated        Care Coordination Interventions:  Yes, provided   Follow up plan: Follow up call scheduled for 09/13/23 at 10 am    Encounter Outcome:  Patient Visit Completed   George Ina RN,BSN,CCM [facility]  [facility], [facility] coordinator / Case Manager Phone: [phone]

## 2023-08-18 NOTE — Patient Instructions (Signed)
Visit Information  Thank you for taking time to visit with me today. Please don't hesitate to contact me if I can be of assistance to you.   Following are the goals we discussed today:   Goals Addressed             This Visit's Progress    Management and education of health conditions.       Interventions Today    Flowsheet Row Most Recent Value  Chronic Disease   Chronic disease during today's visit Congestive Heart Failure (CHF), Other  [bilateral leg weakness/ falls]  General Interventions   General Interventions Discussed/Reviewed General Interventions Discussed, Doctor Visits  [evaluation of current treatment plan for HF/ bilateral leg weakness/ falls and patients adherence to plan as established by provider. Assessed for HF symptoms and bilateral leg weakness]  Doctor Visits Discussed/Reviewed Doctor Visits Discussed  [Advised patient to follow up with her primary care provider regarding increase symptoms of leg weakness/ near falls.  Primary care visit arranged for 09/20/23 at 12:30 pm.  Patient notified of appointment date and time.]  Education Interventions   Education Provided Provided Education, Provided Printed Education  [reviewed signs/ symptoms of heart failure. Advised to weigh daily and record. Heart failure action plan discussed.]  Provided Verbal Education On When to see the doctor  [Advised to notify provider for increase HF symptoms: fatigue, SOB, swelling in feet/ankles/legs, and increase weight gain of 3 lbs overnight or 5 lbs in a week.]  Pharmacy Interventions   Pharmacy Dicussed/Reviewed Pharmacy Topics Discussed  [medication list reviewed and compliance discussed.]  Safety Interventions   Safety Discussed/Reviewed Safety Discussed, Fall Risk  [Fall prevention discussed. Education article on fall prevention sent to patient.]           COMPLETED: RNCM Care Management Expected Outcome:  Monitor, Self-Manage and Reduce Symptoms of Heart Failure       Current  Barriers:  Knowledge Deficits related to the importance of daily weights in the effective management of HF Care Coordination needs related to [facility] and resources needed for effective medication management  in a patient with HF Chronic Disease Management support and education needs related to effective management of HF Wt Readings from Last 3 Encounters:  05/25/23 140 lb (63.5 kg)  02/10/23 139 lb (63 kg)  01/11/23 141 lb (64 kg)     Planned Interventions: Basic overview and discussion of pathophysiology of Heart Failure reviewed. The patient is stable. Denies any new issues with her heart failure or heart health.The patient feels like she is doing very well. The patient is doing well. She states that she has some swelling but that is if she is up on her feet a lot. She denies any acute changes in her heart failure or  heart health. Will continue to monitor.  Provided education on low sodium diet. The patient states compliance with dietary restrictions Reviewed Heart Failure Action Plan in depth. The patient monitors her weight and is around 140 now. She says that she is not having any exacerbations in  her heart failure at this time. Sees specialist on a regular basis. No changes in the plan of care.   Assessed need for readable accurate scales in home. Has scales in her home Provided education about placing scale on hard, flat surface Advised patient to weigh each morning after emptying bladder Discussed importance of daily weight and advised patient to weigh and record daily. The patient states she does not weigh daily. The patient states that  she weighs a couple of times a week. Education on the benefits of daily weights and monitoring for changes in her swelling/edema and monitoring for heart failure exacerbations. Ask the patient to call the office for +2/3 pounds in one day or +5 pounds in one week. Will continue to monitor Review of sx and sx of heart failure and ask the patient to  monitor for acute changes or onset:  F- Fatigue  A- Activity intolerance  C- Chest congestion or cough  E- Edema or Swelling in feet, legs, abdomen  S- Shortness of breath Reviewed role of diuretics in prevention of fluid overload and management of heart failure. The patient takes Lasix 20 mg QD for effective management of fluid balance Discussed the importance of keeping all appointments with provider. Keeps appointments. She has seen Dr. Loleta Chance her cardiologist recently. She knows to call for changes or new needs. Provided patient with education about the role of exercise in the management of heart failure Advised patient to discuss changes in HF, acute onset of swelling or edema noted, and other sx and sx of HF with provider Screening for signs and symptoms of depression related to chronic disease state  Assessed social determinant of health barriers EF% 60 to 65% 09-15-2021  Symptom Management: Take medications as prescribed   Attend all scheduled provider appointments Call provider office for new concerns or questions  call the Suicide and Crisis Lifeline: 988 call the [place] National Suicide Prevention Lifeline: (616)046-4709 or TTY: 551-744-8742 TTY 517-554-7829) to talk to a trained counselor call 1-800-273-TALK (toll free, 24 hour hotline) if experiencing a Mental Health or Behavioral Health Crisis  call office if I gain more than 2 pounds in one day or 5 pounds in one week use salt in moderation watch for swelling in feet, ankles and legs every day weigh myself daily develop a rescue plan follow rescue plan if symptoms flare-up eat more whole grains, fruits and vegetables, lean meats and healthy fats track symptoms and what helps feel better or worse dress right for the weather, hot or cold  Follow Up Plan: Telephone follow up appointment with care management team member scheduled for: 08-18-2023 at 9 am       COMPLETED: [facility] Care Management Expected Outcome:  Monitor,  Self-Manage, and Reduce Symptoms of Hypertension       Current Barriers:  Knowledge Deficits related to consistent measurement of blood pressures and to report blood pressures that are consistently out of range Chronic Disease Management support and education needs related to effective management of HTN BP Readings from Last 3 Encounters:  02/10/23 138/86  01/12/23 (!) 144/48  01/08/23 (!) 162/44     Planned Interventions: Evaluation of current treatment plan related to hypertension self management and patient's adherence to plan as established by provider. The patient has a cardiologist that she sees on a regular basis. States that her blood pressures are more stable post surgery on her neck. She feels she is stable and doing well. Denies any acute changes in her HTN or heart health. She says she is feeling great. She denies any pain with her cervical spine and feels she has recovered well. She is happy and doing things she enjoys doing. Will continue to monitor for changes or new needs. Provided education to patient re: stroke prevention, s/s of heart attack and stroke; Reviewed prescribed diet heart healthy diet. Review and education provided. The patient watches her sodium content. Says that she has an appetite sometimes and sometimes she does  not. She denies any issues with dietary restrictions. Reviewed medications with patient and discussed importance of compliance. The patient is compliant with medications and has worked with pharm D in the past. Ongoing support and medication management with the pharm D. Counseled on the importance of exercise goals with target of 150 minutes per week Discussed plans with patient for ongoing care management follow up and provided patient with direct contact information for care management team; Advised patient, providing education and rationale, to monitor blood pressure daily and record, calling PCP for findings outside established parameters. The patient  does take her blood pressures at home. No readings available at the time of the call. Discussed the goals of blood pressures: <150 systolic and <90 diastolic;  Reviewed scheduled/upcoming provider appointments including: Sees pcp when needed. Sees specialist on a regular basis.  Advised patient to discuss changes in blood pressures or heart health with provider; Provided education on prescribed diet heart healthy diet ;  Discussed complications of poorly controlled blood pressure such as heart disease, stroke, circulatory complications, vision complications, kidney impairment, sexual dysfunction;  Screening for signs and symptoms of depression related to chronic disease state;  Assessed social determinant of health barriers;   Symptom Management: Take medications as prescribed   Attend all scheduled provider appointments Call provider office for new concerns or questions  call the Suicide and Crisis Lifeline: 988 call the [place] National Suicide Prevention Lifeline: 361 681 6589 or TTY: 7041853092 TTY 4084099052) to talk to a trained counselor call 1-800-273-TALK (toll free, 24 hour hotline) if experiencing a Mental Health or Behavioral Health Crisis  check blood pressure 3 times per week learn about high blood pressure take blood pressure log to all doctor appointments call doctor for signs and symptoms of high blood pressure develop an action plan for high blood pressure keep all doctor appointments take medications for blood pressure exactly as prescribed report new symptoms to your doctor eat more whole grains, fruits and vegetables, lean meats and healthy fats  Follow Up Plan: Telephone follow up appointment with care management team member scheduled for: 08-18-2023 at 9 am          Our next appointment is by telephone on 09/13/23 at 10 am  Please call the care guide team at [phone] if you need to cancel or reschedule your appointment.   If you are experiencing a  Mental Health or Behavioral Health Crisis or need someone to talk to, please call the Suicide and Crisis Lifeline: 988 call 1-800-273-TALK (toll free, 24 hour hotline)  Patient verbalizes understanding of instructions and care plan provided today and agrees to view in MyChart. Active MyChart status and patient understanding of how to access instructions and care plan via MyChart confirmed with patient.     George Ina RN,BSN,CCM [place]  [facility], [facility] coordinator / Case Manager Phone: [phone]  Fall Prevention in the Home, Adult Falls can cause injuries and can happen to people of all ages. There are many things you can do to make your home safer and to help prevent falls. What actions can I take to prevent falls? General information Use good lighting in all rooms. Make sure to: Replace any light bulbs that burn out. Turn on the lights in dark areas and use night-lights. Keep items that you use often in easy-to-reach places. Lower the shelves around your home if needed. Move furniture so that there are clear paths around it. Do not use throw rugs or other things on the  floor that can make you trip. If any of your floors are uneven, fix them. Add color or contrast paint or tape to clearly mark and help you see: Grab bars or handrails. First and last steps of staircases. Where the edge of each step is. If you use a ladder or stepladder: Make sure that it is fully opened. Do not climb a closed ladder. Make sure the sides of the ladder are locked in place. Have someone hold the ladder while you use it. Know where your pets are as you move through your home. What can I do in the bathroom?     Keep the floor dry. Clean up any water on the floor right away. Remove soap buildup in the bathtub or shower. Buildup makes bathtubs and showers slippery. Use non-skid mats or decals on the floor of the bathtub or shower. Attach bath mats securely  with double-sided, non-slip rug tape. If you need to sit down in the shower, use a non-slip stool. Install grab bars by the toilet and in the bathtub and shower. Do not use towel bars as grab bars. What can I do in the bedroom? Make sure that you have a light by your bed that is easy to reach. Do not use any sheets or blankets on your bed that hang to the floor. Have a firm chair or bench with side arms that you can use for support when you get dressed. What can I do in the kitchen? Clean up any spills right away. If you need to reach something above you, use a step stool with a grab bar. Keep electrical cords out of the way. Do not use floor polish or wax that makes floors slippery. What can I do with my stairs? Do not leave anything on the stairs. Make sure that you have a light switch at the top and the bottom of the stairs. Make sure that there are handrails on both sides of the stairs. Fix handrails that are broken or loose. Install non-slip stair treads on all your stairs if they do not have carpet. Avoid having throw rugs at the top or bottom of the stairs. Choose a carpet that does not hide the edge of the steps on the stairs. Make sure that the carpet is firmly attached to the stairs. Fix carpet that is loose or worn. What can I do on the outside of my home? Use bright outdoor lighting. Fix the edges of walkways and driveways and fix any cracks. Clear paths of anything that can make you trip, such as tools or rocks. Add color or contrast paint or tape to clearly mark and help you see anything that might make you trip as you walk through a door, such as a raised step or threshold. Trim any bushes or trees on paths to your home. Check to see if handrails are loose or broken and that both sides of all steps have handrails. Install guardrails along the edges of any raised decks and porches. Have leaves, snow, or ice cleared regularly. Use sand, salt, or ice melter on paths if you live  where there is ice and snow during the winter. Clean up any spills in your garage right away. This includes grease or oil spills. What other actions can I take? Review your medicines with your doctor. Some medicines can cause dizziness or changes in blood pressure, which increase your risk of falling. Wear shoes that: Have a low heel. Do not wear high heels. Have rubber bottoms  and are closed at the toe. Feel good on your feet and fit well. Use tools that help you move around if needed. These include: Canes. Walkers. Scooters. Crutches. Ask your doctor what else you can do to help prevent falls. This may include seeing a physical therapist to learn to do exercises to move better and get stronger. Where to find more information Centers for Disease Control and Prevention, STEAD[employer]: TonerPromos.no [employer] on Aging: BaseRingTones.pl National Institute on Aging: BaseRingTones.pl Contact a doctor if: You are afraid of falling at home. You feel weak, drowsy, or dizzy at home. You fall at home. Get help right away if you: Lose consciousness or have trouble moving after a fall. Have a fall that causes a head injury. These symptoms may be an emergency. Get help right away. Call 911. Do not wait to see if the symptoms will go away. Do not drive yourself to the hospital. This information is not intended to replace advice given to you by your health care provider. Make sure you discuss any questions you have with your health care provider. Document Revised: 04/20/2022 Document Reviewed: 04/20/2022 Elsevier Patient Education  2024 [employer].

## 2023-09-07 ENCOUNTER — Other Ambulatory Visit: Payer: Self-pay | Admitting: Physician Assistant

## 2023-09-08 NOTE — Telephone Encounter (Signed)
 Good morning,  Will you please outreach patient to schedule OD follow up.  Thank you,  Ferne Coe

## 2023-09-09 NOTE — Telephone Encounter (Signed)
 Last office visit:  12/16/22 with plan to f/u  in 6 months  Next office visit:  none/does have active recall

## 2023-09-09 NOTE — Telephone Encounter (Signed)
 Called and left voicemail for return call to schedule follow up appt.

## 2023-09-09 NOTE — Telephone Encounter (Signed)
 Appointment scheduled for 12/20/2023

## 2023-09-13 ENCOUNTER — Ambulatory Visit: Payer: Self-pay

## 2023-09-13 NOTE — Patient Instructions (Signed)
 Visit Information  Thank you for taking time to visit with me today. Please don't hesitate to contact me if I can be of assistance to you.   Following are the goals we discussed today:   Goals Addressed             This Visit's Progress    Management and education of health conditions.       Interventions Today    Flowsheet Row Most Recent Value  Chronic Disease   Chronic disease during today's visit Congestive Heart Failure (CHF), Other  [bilateral leg weakness.]  General Interventions   General Interventions Discussed/Reviewed General Interventions Reviewed, Doctor Visits  Doctor Visits Discussed/Reviewed Doctor Visits Reviewed  [reviewed upcoming provider visits. Advised to keep follow up visits with providers as recommended.]  Education Interventions   Education Provided Provided Education  [reveiwed heart failure symptoms.  Discussed when patient should contact provider for symptoms and when to call 911. Discussed importance of weighing dailiy.]  Nutrition Interventions   Nutrition Discussed/Reviewed Nutrition Reviewed, Decreasing salt  [Discussed importance of following a low salt diet.]  Pharmacy Interventions   Pharmacy Dicussed/Reviewed Pharmacy Topics Reviewed  [medications reviewed and compliance discussed. Advised to take medications as prescribed.]  Safety Interventions   Safety Discussed/Reviewed Safety Discussed  Fredna for falls.]              Our next appointment is by telephone on 10/13/23 at 10 am  Please call the care guide team at [phone] if you need to cancel or reschedule your appointment.   If you are experiencing a Mental Health or Behavioral Health Crisis or need someone to talk to, please call the Suicide and Crisis Lifeline: 988 call 1-800-273-TALK (toll free, 24 hour hotline)  Patient verbalizes understanding of instructions and care plan provided today and agrees to view in MyChart. Active MyChart status and patient understanding of  how to access instructions and care plan via MyChart confirmed with patient.     Arvin Seip RN,BSN,CCM [place]  [facility], [facility] coordinator / Case Manager Phone: [phone]

## 2023-09-13 NOTE — Patient Outreach (Signed)
  Care Coordination   Follow Up Visit Note   09/13/2023 Name: KAYDEE MAGEL MRN: [id number] DOB: [birth year]  MAKENNAH OMURA is a 83 y.o. year old female who sees Cleatus Arlyss GORMAN, MD for primary care. I engaged with [name] in the providers office today.  What matters to the patients health and wellness today?  Patient denies any increase in heart failure symptoms. She reports a steady weight of 138 lbs.  Patient states she continues to have ongoing bilateral leg weakness. She states she will discuss this with her doctor at her next appointment on 09/20/23.    Goals Addressed             This Visit's Progress    Management and education of health conditions.       Interventions Today    Flowsheet Row Most Recent Value  Chronic Disease   Chronic disease during today's visit Congestive Heart Failure (CHF), Other  [bilateral leg weakness.]  General Interventions   General Interventions Discussed/Reviewed General Interventions Reviewed, Doctor Visits  Doctor Visits Discussed/Reviewed Doctor Visits Reviewed  [reviewed upcoming provider visits. Advised to keep follow up visits with providers as recommended.]  Education Interventions   Education Provided Provided Education  [reveiwed heart failure symptoms.  Discussed when patient should contact provider for symptoms and when to call 911. Discussed importance of weighing dailiy.]  Nutrition Interventions   Nutrition Discussed/Reviewed Nutrition Reviewed, Decreasing salt  [Discussed importance of following a low salt diet.]  Pharmacy Interventions   Pharmacy Dicussed/Reviewed Pharmacy Topics Reviewed  [medications reviewed and compliance discussed. Advised to take medications as prescribed.]  Safety Interventions   Safety Discussed/Reviewed Safety Discussed  [Assessed for falls.]              SDOH assessments and interventions completed:  No     Care Coordination Interventions:  Yes, provided   Follow up plan: Follow up  call scheduled for 10/13/23 at 10 am    Encounter Outcome:  Patient Visit Completed   Cheree Fowles RN,BSN,CCM [facility]  [facility], [facility] coordinator / Case Manager Phone: [phone]

## 2023-09-20 ENCOUNTER — Ambulatory Visit (INDEPENDENT_AMBULATORY_CARE_PROVIDER_SITE_OTHER): Payer: Medicare HMO | Admitting: Family Medicine

## 2023-09-20 ENCOUNTER — Ambulatory Visit (INDEPENDENT_AMBULATORY_CARE_PROVIDER_SITE_OTHER)
Admission: RE | Admit: 2023-09-20 | Discharge: 2023-09-20 | Disposition: A | Discharge status: Home / Self Care | Payer: Medicare HMO | Source: Ambulatory Visit | Attending: Family Medicine | Admitting: Family Medicine

## 2023-09-20 ENCOUNTER — Encounter: Payer: Self-pay | Admitting: Family Medicine

## 2023-09-20 VITALS — BP 136/70 | HR 59 | Temp 98.0°F | Ht 62.0 in | Wt 138.0 lb

## 2023-09-20 DIAGNOSIS — L659 Nonscarring hair loss, unspecified: Secondary | ICD-10-CM

## 2023-09-20 DIAGNOSIS — M545 Low back pain, unspecified: Secondary | ICD-10-CM | POA: Diagnosis not present

## 2023-09-20 DIAGNOSIS — I700 Atherosclerosis of aorta: Secondary | ICD-10-CM | POA: Diagnosis not present

## 2023-09-20 DIAGNOSIS — M47816 Spondylosis without myelopathy or radiculopathy, lumbar region: Secondary | ICD-10-CM | POA: Diagnosis not present

## 2023-09-20 DIAGNOSIS — M47817 Spondylosis without myelopathy or radiculopathy, lumbosacral region: Secondary | ICD-10-CM | POA: Diagnosis not present

## 2023-09-20 LAB — CBC WITH DIFFERENTIAL/PLATELET
Basophils Absolute: 0 10*3/uL (ref 0.0–0.1)
Basophils Relative: 0.3 % (ref 0.0–3.0)
Eosinophils Absolute: 0 10*3/uL (ref 0.0–0.7)
Eosinophils Relative: 0.6 % (ref 0.0–5.0)
HCT: 38.1 % (ref 36.0–46.0)
Hemoglobin: 12.4 g/dL (ref 12.0–15.0)
Lymphocytes Relative: 18.7 % (ref 12.0–46.0)
Lymphs Abs: 1.4 10*3/uL (ref 0.7–4.0)
MCHC: 32.5 g/dL (ref 30.0–36.0)
MCV: 80.8 fL (ref 78.0–100.0)
Monocytes Absolute: 0.5 10*3/uL (ref 0.1–1.0)
Monocytes Relative: 6.3 % (ref 3.0–12.0)
Neutro Abs: 5.5 10*3/uL (ref 1.4–7.7)
Neutrophils Relative %: 74.1 % (ref 43.0–77.0)
Platelets: 314 10*3/uL (ref 150.0–400.0)
RBC: 4.71 Mil/uL (ref 3.87–5.11)
RDW: 15.2 % (ref 11.5–15.5)
WBC: 7.4 10*3/uL (ref 4.0–10.5)

## 2023-09-20 LAB — COMPREHENSIVE METABOLIC PANEL
ALT: 14 U/L (ref 0–35)
AST: 22 U/L (ref 0–37)
Albumin: 4.7 g/dL (ref 3.5–5.2)
Alkaline Phosphatase: 112 U/L (ref 39–117)
BUN: 13 mg/dL (ref 6–23)
CO2: 32 meq/L (ref 19–32)
Calcium: 9.3 mg/dL (ref 8.4–10.5)
Chloride: 99 meq/L (ref 96–112)
Creatinine, Ser: 0.91 mg/dL (ref 0.40–1.20)
GFR: 58.33 mL/min — ABNORMAL LOW (ref >60.00)
Glucose, Bld: 97 mg/dL (ref 70–99)
Potassium: 4.3 meq/L (ref 3.5–5.1)
Sodium: 140 meq/L (ref 135–145)
Total Bilirubin: 0.4 mg/dL (ref 0.2–1.2)
Total Protein: 7 g/dL (ref 6.0–8.3)

## 2023-09-20 LAB — TSH: TSH: 1.29 u[IU]/mL (ref 0.35–5.50)

## 2023-09-20 MED ORDER — GABAPENTIN 400 MG PO CAPS: 400.0000 mg | ORAL_CAPSULE | Freq: Four times a day (QID) | ORAL | Status: DC

## 2023-09-20 NOTE — Patient Instructions (Signed)
Go to the lab on the way out.   If you have mychart we'll likely use that to update you.    Try taking gabapentin up to 4 times per day.  See if that helps the pain.  We'll be in touch.  Take care.  Glad to see you.

## 2023-09-20 NOTE — Progress Notes (Unsigned)
She isn't have sig neck or arm sx.    She has lower back pain and leg pain.  That is ongoing, worse over the last few months.  Can radiate down both legs and she can feel "wobbly."  No falls.  She is careful about falls.  Pain is nearly constant.    In pain in spite of prev L4 L5 decompression surgery with subsequent ablation.  She can't tolerate prednisone.    She has diffuse hair loss.    She has been taking gabapentin 2-3 times per day.    Pain is worse standing, better sitting.   R SLR positive.  S/S grossly wnl.

## 2023-09-22 DIAGNOSIS — L659 Nonscarring hair loss, unspecified: Secondary | ICD-10-CM | POA: Insufficient documentation

## 2023-09-22 NOTE — Assessment & Plan Note (Signed)
Unclear source, see notes on labs.

## 2023-09-22 NOTE — Assessment & Plan Note (Signed)
She can try taking gabapentin up to 4 times per day and see if that helps the pain.  Concern for spinal stenosis since pain is worse standing, better sitting.  See notes on plain films, to exclude compression fracture.

## 2023-09-29 ENCOUNTER — Other Ambulatory Visit: Payer: Self-pay | Admitting: Family Medicine

## 2023-09-29 DIAGNOSIS — M545 Low back pain, unspecified: Secondary | ICD-10-CM

## 2023-10-04 ENCOUNTER — Other Ambulatory Visit: Payer: Self-pay | Admitting: Physician Assistant

## 2023-10-05 ENCOUNTER — Encounter: Payer: Self-pay | Admitting: *Deleted

## 2023-10-13 ENCOUNTER — Ambulatory Visit: Payer: Self-pay

## 2023-10-13 NOTE — Patient Instructions (Signed)
Visit Information  Thank you for taking time to visit with me today. Please don't hesitate to contact me if I can be of assistance to you.   Following are the goals we discussed today:   Goals Addressed             This Visit's Progress    Management and education of health conditions.       Interventions Today    Flowsheet Row Most Recent Value  Chronic Disease   Chronic disease during today's visit Congestive Heart Failure (CHF), Other  [flu like symptoms.]  General Interventions   General Interventions Discussed/Reviewed General Interventions Reviewed, Doctor Visits  Doctor Visits Discussed/Reviewed Doctor Visits Reviewed  Education Interventions   Education Provided Provided Education  [Advised to elevate head when laying down to help with post nasal drip/ coughing, use honey in tea or warm water for cough and stay hydrated. Advised to use humidifier and OTC medication approved by your provider for muscle aches/ pain]  Provided Verbal Education On Other, When to see the doctor  [Discussed when to see provider for ongoing flu like symptoms and/ or HF symptoms. Reviewed signs of heart failure exacerbation. Advised ongoing monitoring of weight. Advised to notify provider for increase SOB or call 911 for severe SOB.]  Pharmacy Interventions   Pharmacy Dicussed/Reviewed Pharmacy Topics Reviewed  [medications reviewed and compliance discussed.]               Our next appointment is by telephone on 11/09/23 at 10 am  Please call the care guide team at [phone] if you need to cancel or reschedule your appointment.   If you are experiencing a Mental Health or Behavioral Health Crisis or need someone to talk to, please call the Suicide and Crisis Lifeline: 988 call 1-800-273-TALK (toll free, 24 hour hotline)  Patient verbalizes understanding of instructions and care plan provided today and agrees to view in MyChart. Active MyChart status and patient understanding of how to  access instructions and care plan via MyChart confirmed with patient.     George Ina RN, BSN, CCM [employer], Population Health Case Manager Phone: [phone]     e

## 2023-10-13 NOTE — Patient Outreach (Signed)
  Care Coordination   Follow Up Visit Note   10/13/2023 Name: Catherine Green MRN: [id number] DOB: 1941-01-08  Catherine Green is a 83 y.o. year old female who sees Catherine Nam, MD for primary care. I spoke with  Catherine Green by phone today.  What matters to the patients health and wellness today?  Patient states she is not feeling very well today. She reports having flu like symptoms since 09/09/23.  Patient reports having slight increase SOB due to congestions. She states she is taking OTC coricidin.   Denies having a fever or headache. She reports having muscle ache/ pain.  Patient reports her weight has been stable over the last several days at 133/1- 133.8.  Reports mild swelling in ankles.    Goals Addressed             This Visit's Progress    Management and education of health conditions.       Interventions Today    Flowsheet Row Most Recent Value  Chronic Disease   Chronic disease during today's visit Congestive Heart Failure (CHF), Other  [flu like symptoms.]  General Interventions   General Interventions Discussed/Reviewed General Interventions Reviewed, Doctor Visits  Doctor Visits Discussed/Reviewed Doctor Visits Reviewed  Education Interventions   Education Provided Provided Education  [Advised to elevate head when laying down to help with post nasal drip/ coughing, use honey in tea or warm water for cough and stay hydrated. Advised to use humidifier and OTC medication approved by your provider for muscle aches/ pain]  Provided Verbal Education On Other, When to see the doctor  [Discussed when to see provider for ongoing flu like symptoms and/ or HF symptoms. Reviewed signs of heart failure exacerbation. Advised ongoing monitoring of weight. Advised to notify provider for increase SOB or call 911 for severe SOB.]  Pharmacy Interventions   Pharmacy Dicussed/Reviewed Pharmacy Topics Reviewed  [medications reviewed and compliance discussed.]               SDOH  assessments and interventions completed:  No     Care Coordination Interventions:  Yes, provided   Follow up plan: Follow up call scheduled for 11/09/23 at 10 am    Encounter Outcome:  Patient Visit Completed   George Ina RN, BSN, CCM [place]  [facility], Population Health Case Manager Phone: [phone]

## 2023-10-29 ENCOUNTER — Other Ambulatory Visit: Payer: Self-pay | Admitting: *Deleted

## 2023-10-29 DIAGNOSIS — I6523 Occlusion and stenosis of bilateral carotid arteries: Secondary | ICD-10-CM

## 2023-11-05 ENCOUNTER — Ambulatory Visit: Payer: Medicare HMO | Admitting: Physician Assistant

## 2023-11-05 ENCOUNTER — Encounter: Payer: Self-pay | Admitting: Physician Assistant

## 2023-11-05 ENCOUNTER — Ambulatory Visit (HOSPITAL_COMMUNITY)
Admission: RE | Admit: 2023-11-05 | Discharge: 2023-11-05 | Disposition: A | Discharge status: Home / Self Care | Payer: Medicare HMO | Source: Ambulatory Visit | Attending: Vascular Surgery | Admitting: Vascular Surgery

## 2023-11-05 VITALS — BP 180/78 | HR 74 | Temp 98.1°F | Resp 20 | Ht 62.0 in | Wt 140.0 lb

## 2023-11-05 DIAGNOSIS — I6523 Occlusion and stenosis of bilateral carotid arteries: Secondary | ICD-10-CM | POA: Diagnosis not present

## 2023-11-05 NOTE — Progress Notes (Signed)
 History of Present Illness:  Patient is a 83 y.o. year old female who presents for evaluation of carotid stenosis.  Pt is s/p right CEA 1992 and redo CEA in 2006 by Dr. 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  She also underwent  left CEA on 07/20/2014 also by Dr. Arbie Cookey for asymptomatic carotid artery stenosis.     The patient denies symptoms of TIA, amaurosis, or stroke.  Pt denies any amaurosis fugax, speech difficulties, or any new weakness, numbness, paralysis.   She has significant  discomfort s/p cervical fusion and states her legs give out at times.  He husband is here to assist her with ambulation.     She is medically managed on ASA and Statin daily and she is not a smoker.       Past Medical History:  Diagnosis Date   Allergy    hay fever   Anemia    Anxiety    Back pain    Carotid artery occlusion    Cerebrovascular disease    extracranial; occlusive   Chicken pox    Coronary artery disease    Depression    Dizziness    Dizziness    DVT (deep venous thrombosis) (HCC)    Fainting spell    GERD (gastroesophageal reflux disease)    Headache    Heart murmur    Hyperlipidemia    Hypertension    Leg pain    Mitral regurgitation    PONV (postoperative nausea and vomiting)    severe nausea and vomiting   Pre-syncope    PVD (peripheral vascular disease) (HCC)    endarterectomy by Dr. Arbie Cookey   Seasonal allergies    Shortness of breath dyspnea    wth ambulation at times   Swelling of both ankles    and abdomen; takes Lasix when needed   Thrombophlebitis    following childbirth   Ulcer     Past Surgical History:  Procedure Laterality Date   ANTERIOR CERVICAL DECOMP/DISCECTOMY FUSION N/A 01/11/2023   Procedure: Anterior Cervical Decompression/Discectomy Fusion Cervical Five-Cervical Six - Cervical Six-Cervical Seven;  Surgeon: Julio Sicks, MD;  Location: [facility] OR;  Service: Neurosurgery;  Laterality: N/A;  3C   APPENDECTOMY      BREAST SURGERY     saline implants   CARDIAC CATHETERIZATION     CAROTID ENDARTERECTOMY  1992   CAROTID ENDARTERECTOMY Right 02/13/2005   Re-do Right CE   CATARACT EXTRACTION, BILATERAL  2013   CHOLECYSTECTOMY, LAPAROSCOPIC  04/02/2014   Dr. Renda Rolls   CORONARY ARTERY BYPASS GRAFT  2006   x3 Dr. Donata Clay   ENDARTERECTOMY Left 07/20/2014   Procedure: ENDARTERECTOMY CAROTID-LEFT;  Surgeon: Larina Earthly, MD;  Location: [facility] OR;  Service: Vascular;  Laterality: Left;   EYE SURGERY Bilateral 10/2011   Cataract Left eye   SEPTOPLASTY     with bilateral inferior turbinate reductions   SPINE SURGERY  12/2015   TOTAL ABDOMINAL HYSTERECTOMY     UPPER GASTROINTESTINAL ENDOSCOPY     had polyps removed from esophagus   UPPER GI ENDOSCOPY       Social History Social History   Tobacco Use   Smoking status: Former    Current packs/day: 0.00    Average packs/day: 0.3 packs/day for 15.0 years (4.5 ttl pk-yrs)    Types: Cigarettes    Start date: 08/31/1981    Quit date: 08/31/1996    Years since quitting: 27.1  Passive exposure: Never   Smokeless tobacco: Never  Vaping Use   Vaping status: Never Used  Substance Use Topics   Alcohol use: No    Alcohol/week: 0.0 standard drinks of alcohol   Drug use: No    Family History Family History  Problem Relation Age of Onset   Aneurysm Mother        brain   Heart disease Mother        Aneyursm    Hyperlipidemia Mother    Hypertension Mother    Varicose Veins Mother    Bleeding Disorder Mother    Heart disease Father    Cirrhosis Father    Heart attack Father    Hyperlipidemia Father    Hypertension Father    Cancer Sister        lung   Heart disease Sister        Aneurysm   Hyperlipidemia Sister    Hypertension Sister    Varicose Veins Sister    Bleeding Disorder Sister    Heart disease Brother        Before age 65   Aneurysm Brother    Deep vein thrombosis Brother    Birth defects Brother    Hyperlipidemia Brother     Hypertension Brother    Peripheral vascular disease Daughter    Hyperlipidemia Daughter    Hypertension Daughter    Hypertension Other    Breast cancer Neg Hx    Colon cancer Neg Hx     Allergies  Allergies  Allergen Reactions   Lyrica [Pregabalin] Shortness Of Breath, Swelling and Other (See Comments)    chest tight   Hydrocodone Other (See Comments)    hallucinate   Ivp Dye [Iodinated Contrast Media] Hives and Itching        Lipitor [Atorvastatin] Nausea And Vomiting   Methylprednisolone Other (See Comments)    Severe weekness    Oxycodone Hcl Other (See Comments)    Hallucinations    Penicillin G Nausea And Vomiting   Shellfish Allergy Hives and Itching   Buspar [Buspirone]     Worsening mood   Clavulanic Acid    Prednisone    Codeine Nausea And Vomiting and Other (See Comments)   Cymbalta [Duloxetine Hcl] Nausea Only    GI upset at 30mg    Demerol Nausea And Vomiting   Doxycycline Nausea Only and Other (See Comments)    Weakness, sick to her stomach   Effexor [Venlafaxine] Nausea Only   Hydrochlorothiazide Other (See Comments)    Not an allergy but urinary frequency and sweating at 25mg    Hydroxyzine Other (See Comments)    Excessive sweating.    Metoprolol Other (See Comments)    Slowed body down, per pt    Protonix [Pantoprazole Sodium] Nausea Only   Trazodone And Nefazodone Other (See Comments)    Sedation      Current Outpatient Medications  Medication Sig Dispense Refill   albuterol (VENTOLIN HFA) 108 (90 Base) MCG/ACT inhaler Inhale 2 puffs into the lungs every 6 (six) hours as needed for wheezing or shortness of breath. 8 g 3   amLODipine (NORVASC) 2.5 MG tablet Take 0.5 tablets (1.25 mg total) by mouth in the morning. 45 tablet 3   aspirin 81 MG tablet Take 1 tablet (81 mg total) by mouth daily. 30 tablet    Coenzyme Q10 (CO Q10) 100 MG CAPS Take 100 mg by mouth daily.     ezetimibe (ZETIA) 10 MG tablet Take 1 tablet (10 mg  total) by mouth daily. 90  tablet 2   furosemide (LASIX) 20 MG tablet TAKE 1 TABLET BY MOUTH DAILY. 90 tablet 1   gabapentin (NEURONTIN) 400 MG capsule Take 1 capsule (400 mg total) by mouth 4 (four) times daily.     ipratropium (ATROVENT) 0.03 % nasal spray      loratadine (CLARITIN) 10 MG tablet Take 10 mg by mouth daily.     metoprolol succinate (TOPROL-XL) 25 MG 24 hr tablet TAKE 1/2 TABLET AT BEDTIME 45 tablet 0   omeprazole (PRILOSEC) 20 MG capsule Take 1 capsule (20 mg total) by mouth 2 (two) times daily before a meal.     polyethylene glycol powder (GLYCOLAX/MIRALAX) 17 GM/SCOOP powder Take 17 g by mouth 2 (two) times daily as needed. 850 g 0   potassium chloride (KLOR-CON M) 10 MEQ tablet Take 10 mEq by mouth daily.     rosuvastatin (CRESTOR) 20 MG tablet Take 1 tablet (20 mg total) by mouth daily. Overdue follow up visit. PLEASE CALL OFFICE TO SCHEDULE APPOINTMENT PRIOR TO NEXT REFILL 90 tablet 0   sertraline (ZOLOFT) 100 MG tablet TAKE ONE AND A HALF TABLETS BY MOUTH EVERY DAY 150 tablet 2   vitamin B-12 (CYANOCOBALAMIN) 1000 MCG tablet Take 1 tablet (1,000 mcg total) by mouth daily.     potassium chloride (KLOR-CON M) 10 MEQ tablet Take 1 tablet (10 mEq total) by mouth daily. Overdue follow up visit. PLEASE CALL OFFICE TO SCHEDULE APPOINTMENT PRIOR TO NEXT REFILL 90 tablet 0   No current facility-administered medications for this visit.    ROS:   General:  No weight loss, Fever, chills  HEENT: No recent headaches, no nasal bleeding, no visual changes, no sore throat  Neurologic: positive dizziness, blackouts, seizures. No recent symptoms of stroke or mini- stroke. No recent episodes of slurred speech, or temporary blindness.  Cardiac: No recent episodes of chest pain/pressure, no shortness of breath at rest.  No shortness of breath with exertion.  Denies history of atrial fibrillation or irregular heartbeat  Vascular: No history of rest pain in feet.  No history of claudication.  No history of  non-healing ulcer, No history of DVT   Pulmonary: No home oxygen, no productive cough, no hemoptysis,  No asthma or wheezing  Musculoskeletal:  [ ]  Arthritis, [ ]  Low back pain,  [ ]  Joint pain [x]  cervical pain  Hematologic:No history of hypercoagulable state.  No history of easy bleeding.  No history of anemia  Gastrointestinal: No hematochezia or melena,  No gastroesophageal reflux, no trouble swallowing  Urinary: [ ]  chronic Kidney disease, [ ]  on HD - [ ]  MWF or [ ]  TTHS, [ ]  Burning with urination, [ ]  Frequent urination, [ ]  Difficulty urinating;   Skin: No rashes  Psychological: No history of anxiety,  No history of depression   Physical Examination  Vitals:   11/05/23 1227 11/05/23 1229  BP: (!) 183/72 (!) 180/78  Pulse: 74   Resp: 20   Temp: 98.1 F (36.7 C)   TempSrc: Temporal   SpO2: 90%   Weight: 140 lb (63.5 kg)   Height: 5\' 2"  (1.575 m)     Body mass index is 25.61 kg/m.  General:  Alert and oriented, no acute distress HEENT: Normal Neck: No bruit or JVD Pulmonary: Clear to auscultation bilaterally Cardiac: Regular Rate and Rhythm without murmur Gastrointestinal: Soft, non-tender, non-distended, no mass, no scars Skin: No rash Extremity Pulses:   radial pulses bilaterally Musculoskeletal: No deformity  or edema  Neurologic: Upper and lower extremity motor grossly intact and symmetric  DATA:     Right Carotid Findings:  +----------+--------+--------+--------+-------------------------------+----  ----+           PSV cm/sEDV cm/sStenosisPlaque Description              Comments  +----------+--------+--------+--------+-------------------------------+----  ----+  CCA Prox  58      14              heterogenous                              +----------+--------+--------+--------+-------------------------------+----  ----+  CCA Mid   96      21              Soft plaque                                +----------+--------+--------+--------+-------------------------------+----  ----+  CCA Distal72      13              heterogenous                              +----------+--------+--------+--------+-------------------------------+----  ----+  ICA Prox  91      25      1-39%   heterogenous, calcific,                                                     homogeneous and irregular                 +----------+--------+--------+--------+-------------------------------+----  ----+  ICA Mid   90      19                                                        +----------+--------+--------+--------+-------------------------------+----  ----+  ICA Distal105     26                                                        +----------+--------+--------+--------+-------------------------------+----  ----+  ECA      100     24                                                        +----------+--------+--------+--------+-------------------------------+----  ----+   +----------+--------+-------+--------+-------------------+           PSV cm/sEDV cmsDescribeArm Pressure (mmHG)  +----------+--------+-------+--------+-------------------+  [id number]    10     Stenotic193                  +----------+--------+-------+--------+-------------------+   +---------+--------+--+--------+-+---------+  VertebralPSV cm/s55EDV cm/s8Antegrade  +---------+--------+--+--------+-+---------+      Left Carotid Findings:  +----------+--------+--------+--------+------------------+--------+  PSV cm/sEDV cm/sStenosisPlaque DescriptionComments  +----------+--------+--------+--------+------------------+--------+  CCA Prox  113     18                                          +----------+--------+--------+--------+------------------+--------+  CCA Mid   122     19                                           +----------+--------+--------+--------+------------------+--------+  CCA Distal135     18              heterogenous                +----------+--------+--------+--------+------------------+--------+  ICA Prox  191     43      40-59%  heterogenous                +----------+--------+--------+--------+------------------+--------+  ICA Mid   140     27                                          +----------+--------+--------+--------+------------------+--------+  ICA Distal115     29                                          +----------+--------+--------+--------+------------------+--------+  ECA      118     5                                           +----------+--------+--------+--------+------------------+--------+   +----------+--------+--------+--------+-------------------+           PSV cm/sEDV cm/sDescribeArm Pressure (mmHG)  +----------+--------+--------+--------+-------------------+  Subclavian180    2               191                  +----------+--------+--------+--------+-------------------+   +---------+--------+--+--------+--+---------+  VertebralPSV cm/s86EDV cm/s19Antegrade  +---------+--------+--+--------+--+---------+   Summary:  Right Carotid: Velocities in the right ICA are consistent with a 1-39%  stenosis.   Left Carotid: Velocities in the left ICA are consistent with a 40-59%  stenosis.   Vertebrals: Bilateral vertebral arteries demonstrate antegrade flow.  Subclavians: Right subclavian artery was stenotic. Normal flow  hemodynamics were               seen in the left subclavian artery.   ASSESSMENT/PLAN:  Patient is a 83 y.o. year old female who presents for evaluation of carotid stenosis.  Pt is s/p right CEA 1992 and redo CEA in 2006 by Dr. 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  She also underwent  left CEA on 07/20/2014 also by Dr. Arbie Cookey for asymptomatic carotid artery  stenosis.   Her duplex demonstrated slight increase in left ICA stenosis 40-59%, the right remains < 39%.  Over no significant change.  She denies new symptoms of stroke/TIA.  She will continue to ambulate as tolerates, take asa and Stain daily.  I will have her f/u for repeat duplex  in 1 year.  If she develops stroke like symptoms she will call 911.    Her BP was elevated B UE today, they monitor it at home and will recheck it.  If it continues to be elevate she will call her PCP.     Mosetta Pigeon PA-C [facility] Office: [phone]  MD in clinic [place]

## 2023-11-09 ENCOUNTER — Emergency Department

## 2023-11-09 ENCOUNTER — Ambulatory Visit: Payer: Self-pay

## 2023-11-09 ENCOUNTER — Encounter: Payer: Self-pay | Admitting: Family Medicine

## 2023-11-09 ENCOUNTER — Inpatient Hospital Stay
Admission: EM | Admit: 2023-11-09 | Discharge: 2023-11-12 | DRG: 291 | Disposition: A | Discharge status: Home / Self Care | Attending: Internal Medicine | Admitting: Internal Medicine

## 2023-11-09 ENCOUNTER — Ambulatory Visit (INDEPENDENT_AMBULATORY_CARE_PROVIDER_SITE_OTHER): Admitting: Family Medicine

## 2023-11-09 ENCOUNTER — Other Ambulatory Visit: Payer: Self-pay

## 2023-11-09 VITALS — BP 162/80 | HR 64 | Temp 98.5°F | Ht 62.0 in | Wt 142.2 lb

## 2023-11-09 DIAGNOSIS — J040 Acute laryngitis: Secondary | ICD-10-CM | POA: Diagnosis present

## 2023-11-09 DIAGNOSIS — Z87891 Personal history of nicotine dependence: Secondary | ICD-10-CM | POA: Diagnosis not present

## 2023-11-09 DIAGNOSIS — I509 Heart failure, unspecified: Secondary | ICD-10-CM | POA: Diagnosis not present

## 2023-11-09 DIAGNOSIS — I10 Essential (primary) hypertension: Secondary | ICD-10-CM | POA: Diagnosis present

## 2023-11-09 DIAGNOSIS — J9601 Acute respiratory failure with hypoxia: Secondary | ICD-10-CM

## 2023-11-09 DIAGNOSIS — Z888 Allergy status to other drugs, medicaments and biological substances status: Secondary | ICD-10-CM | POA: Diagnosis not present

## 2023-11-09 DIAGNOSIS — Z8249 Family history of ischemic heart disease and other diseases of the circulatory system: Secondary | ICD-10-CM | POA: Diagnosis not present

## 2023-11-09 DIAGNOSIS — Z79899 Other long term (current) drug therapy: Secondary | ICD-10-CM | POA: Diagnosis not present

## 2023-11-09 DIAGNOSIS — Z981 Arthrodesis status: Secondary | ICD-10-CM

## 2023-11-09 DIAGNOSIS — F32A Depression, unspecified: Secondary | ICD-10-CM | POA: Diagnosis not present

## 2023-11-09 DIAGNOSIS — I4719 Other supraventricular tachycardia: Secondary | ICD-10-CM | POA: Diagnosis present

## 2023-11-09 DIAGNOSIS — I25810 Atherosclerosis of coronary artery bypass graft(s) without angina pectoris: Secondary | ICD-10-CM | POA: Diagnosis not present

## 2023-11-09 DIAGNOSIS — Z951 Presence of aortocoronary bypass graft: Secondary | ICD-10-CM | POA: Diagnosis not present

## 2023-11-09 DIAGNOSIS — Z91041 Radiographic dye allergy status: Secondary | ICD-10-CM | POA: Diagnosis not present

## 2023-11-09 DIAGNOSIS — Z880 Allergy status to penicillin: Secondary | ICD-10-CM | POA: Diagnosis not present

## 2023-11-09 DIAGNOSIS — Z7982 Long term (current) use of aspirin: Secondary | ICD-10-CM | POA: Diagnosis not present

## 2023-11-09 DIAGNOSIS — Z1152 Encounter for screening for COVID-19: Secondary | ICD-10-CM

## 2023-11-09 DIAGNOSIS — I110 Hypertensive heart disease with heart failure: Principal | ICD-10-CM | POA: Diagnosis present

## 2023-11-09 DIAGNOSIS — I4891 Unspecified atrial fibrillation: Secondary | ICD-10-CM | POA: Diagnosis not present

## 2023-11-09 DIAGNOSIS — K219 Gastro-esophageal reflux disease without esophagitis: Secondary | ICD-10-CM | POA: Diagnosis present

## 2023-11-09 DIAGNOSIS — R918 Other nonspecific abnormal finding of lung field: Secondary | ICD-10-CM | POA: Diagnosis not present

## 2023-11-09 DIAGNOSIS — Z8489 Family history of other specified conditions: Secondary | ICD-10-CM

## 2023-11-09 DIAGNOSIS — I272 Pulmonary hypertension, unspecified: Secondary | ICD-10-CM | POA: Diagnosis not present

## 2023-11-09 DIAGNOSIS — I5031 Acute diastolic (congestive) heart failure: Secondary | ICD-10-CM | POA: Diagnosis not present

## 2023-11-09 DIAGNOSIS — Z7951 Long term (current) use of inhaled steroids: Secondary | ICD-10-CM

## 2023-11-09 DIAGNOSIS — I739 Peripheral vascular disease, unspecified: Secondary | ICD-10-CM | POA: Diagnosis not present

## 2023-11-09 DIAGNOSIS — Z83438 Family history of other disorder of lipoprotein metabolism and other lipidemia: Secondary | ICD-10-CM

## 2023-11-09 DIAGNOSIS — Z555 Less than a high school diploma: Secondary | ICD-10-CM

## 2023-11-09 DIAGNOSIS — I25708 Atherosclerosis of coronary artery bypass graft(s), unspecified, with other forms of angina pectoris: Secondary | ICD-10-CM | POA: Diagnosis not present

## 2023-11-09 DIAGNOSIS — I5033 Acute on chronic diastolic (congestive) heart failure: Secondary | ICD-10-CM | POA: Diagnosis present

## 2023-11-09 DIAGNOSIS — Z91013 Allergy to seafood: Secondary | ICD-10-CM

## 2023-11-09 DIAGNOSIS — I700 Atherosclerosis of aorta: Secondary | ICD-10-CM | POA: Diagnosis not present

## 2023-11-09 DIAGNOSIS — E785 Hyperlipidemia, unspecified: Secondary | ICD-10-CM | POA: Diagnosis present

## 2023-11-09 DIAGNOSIS — R0902 Hypoxemia: Secondary | ICD-10-CM | POA: Diagnosis not present

## 2023-11-09 DIAGNOSIS — M7989 Other specified soft tissue disorders: Secondary | ICD-10-CM | POA: Diagnosis not present

## 2023-11-09 DIAGNOSIS — J441 Chronic obstructive pulmonary disease with (acute) exacerbation: Secondary | ICD-10-CM

## 2023-11-09 DIAGNOSIS — R0602 Shortness of breath: Secondary | ICD-10-CM | POA: Diagnosis not present

## 2023-11-09 DIAGNOSIS — I25118 Atherosclerotic heart disease of native coronary artery with other forms of angina pectoris: Secondary | ICD-10-CM | POA: Diagnosis not present

## 2023-11-09 DIAGNOSIS — Z885 Allergy status to narcotic agent status: Secondary | ICD-10-CM

## 2023-11-09 DIAGNOSIS — J90 Pleural effusion, not elsewhere classified: Secondary | ICD-10-CM | POA: Diagnosis not present

## 2023-11-09 DIAGNOSIS — R609 Edema, unspecified: Secondary | ICD-10-CM | POA: Diagnosis not present

## 2023-11-09 DIAGNOSIS — Z801 Family history of malignant neoplasm of trachea, bronchus and lung: Secondary | ICD-10-CM

## 2023-11-09 DIAGNOSIS — Z8672 Personal history of thrombophlebitis: Secondary | ICD-10-CM

## 2023-11-09 DIAGNOSIS — Z832 Family history of diseases of the blood and blood-forming organs and certain disorders involving the immune mechanism: Secondary | ICD-10-CM

## 2023-11-09 HISTORY — DX: Acute respiratory failure with hypoxia: J96.01

## 2023-11-09 LAB — CBC
HCT: 36.8 % (ref 36.0–46.0)
Hemoglobin: 11.4 g/dL — ABNORMAL LOW (ref 12.0–15.0)
MCH: 26.4 pg (ref 26.0–34.0)
MCHC: 31 g/dL (ref 30.0–36.0)
MCV: 85.2 fL (ref 80.0–100.0)
Platelets: 256 10*3/uL (ref 150–400)
RBC: 4.32 MIL/uL (ref 3.87–5.11)
RDW: 14.8 % (ref 11.5–15.5)
WBC: 6.1 10*3/uL (ref 4.0–10.5)
nRBC: 0 % (ref 0.0–0.2)

## 2023-11-09 LAB — RESP PANEL BY RT-PCR (RSV, FLU A&B, COVID)  RVPGX2
Influenza A by PCR: NEGATIVE
Influenza B by PCR: NEGATIVE
Resp Syncytial Virus by PCR: NEGATIVE
SARS Coronavirus 2 by RT PCR: NEGATIVE

## 2023-11-09 LAB — BASIC METABOLIC PANEL
Anion gap: 11 (ref 5–15)
BUN: 16 mg/dL (ref 8–23)
CO2: 24 mmol/L (ref 22–32)
Calcium: 9 mg/dL (ref 8.9–10.3)
Chloride: 103 mmol/L (ref 98–111)
Creatinine, Ser: 0.72 mg/dL (ref 0.44–1.00)
GFR, Estimated: >60 mL/min (ref >60)
Glucose, Bld: 95 mg/dL (ref 70–99)
Potassium: 4.4 mmol/L (ref 3.5–5.1)
Sodium: 138 mmol/L (ref 135–145)

## 2023-11-09 LAB — TROPONIN I (HIGH SENSITIVITY)
Troponin I (High Sensitivity): 8 ng/L (ref <18)
Troponin I (High Sensitivity): 8 ng/L (ref <18)

## 2023-11-09 LAB — BRAIN NATRIURETIC PEPTIDE: B Natriuretic Peptide: 204.2 pg/mL — ABNORMAL HIGH (ref 0.0–100.0)

## 2023-11-09 LAB — PROCALCITONIN: Procalcitonin: <0.1 ng/mL

## 2023-11-09 MED ORDER — SERTRALINE HCL 50 MG PO TABS
150.0000 mg | ORAL_TABLET | Freq: Every day | ORAL | Status: DC
  Administered 2023-11-10 – 2023-11-12 (×3): 150 mg via ORAL
  Filled 2023-11-09 (×3): qty 3

## 2023-11-09 MED ORDER — ENOXAPARIN SODIUM 40 MG/0.4ML IJ SOSY
40.0000 mg | PREFILLED_SYRINGE | INTRAMUSCULAR | Status: DC
  Administered 2023-11-09 – 2023-11-11 (×3): 40 mg via SUBCUTANEOUS
  Filled 2023-11-09 (×3): qty 0.4

## 2023-11-09 MED ORDER — EZETIMIBE 10 MG PO TABS
10.0000 mg | ORAL_TABLET | Freq: Every day | ORAL | Status: DC
Start: 2023-11-10 — End: 2023-11-12
  Administered 2023-11-10 – 2023-11-12 (×3): 10 mg via ORAL
  Filled 2023-11-09 (×3): qty 1

## 2023-11-09 MED ORDER — SODIUM CHLORIDE 0.9% FLUSH
3.0000 mL | Freq: Two times a day (BID) | INTRAVENOUS | Status: DC
  Administered 2023-11-09 – 2023-11-12 (×6): 3 mL via INTRAVENOUS

## 2023-11-09 MED ORDER — AMLODIPINE BESYLATE 2.5 MG PO TABS
1.2500 mg | ORAL_TABLET | Freq: Every morning | ORAL | Status: DC
  Administered 2023-11-10 – 2023-11-12 (×3): 1.25 mg via ORAL
  Filled 2023-11-09 (×3): qty 0.5

## 2023-11-09 MED ORDER — FUROSEMIDE 10 MG/ML IJ SOLN
40.0000 mg | Freq: Every day | INTRAMUSCULAR | Status: DC
Start: 2023-11-10 — End: 2023-11-10
  Administered 2023-11-10: 40 mg via INTRAVENOUS
  Filled 2023-11-09: qty 4

## 2023-11-09 MED ORDER — ACETAMINOPHEN 325 MG PO TABS: 650.0000 mg | ORAL_TABLET | ORAL | Status: DC | PRN

## 2023-11-09 MED ORDER — ASPIRIN 81 MG PO TBEC
81.0000 mg | DELAYED_RELEASE_TABLET | Freq: Every day | ORAL | Status: DC
  Administered 2023-11-10 – 2023-11-12 (×3): 81 mg via ORAL
  Filled 2023-11-09 (×3): qty 1

## 2023-11-09 MED ORDER — ONDANSETRON HCL 4 MG/2ML IJ SOLN
4.0000 mg | Freq: Four times a day (QID) | INTRAMUSCULAR | Status: DC | PRN
  Administered 2023-11-09: 4 mg via INTRAVENOUS
  Filled 2023-11-09: qty 2

## 2023-11-09 MED ORDER — FUROSEMIDE 10 MG/ML IJ SOLN
40.0000 mg | Freq: Once | INTRAMUSCULAR | Status: AC
  Administered 2023-11-09: 40 mg via INTRAVENOUS
  Filled 2023-11-09: qty 4

## 2023-11-09 MED ORDER — METOPROLOL SUCCINATE ER 25 MG PO TB24
12.5000 mg | ORAL_TABLET | Freq: Every day | ORAL | Status: DC
  Administered 2023-11-09 – 2023-11-11 (×3): 12.5 mg via ORAL
  Filled 2023-11-09 (×3): qty 1

## 2023-11-09 MED ORDER — ALBUTEROL SULFATE (2.5 MG/3ML) 0.083% IN NEBU: 2.5000 mg | INHALATION_SOLUTION | Freq: Four times a day (QID) | RESPIRATORY_TRACT | Status: DC | PRN

## 2023-11-09 MED ORDER — ROSUVASTATIN CALCIUM 20 MG PO TABS
20.0000 mg | ORAL_TABLET | Freq: Every day | ORAL | Status: DC
Start: 2023-11-10 — End: 2023-11-12
  Administered 2023-11-10 – 2023-11-11 (×2): 20 mg via ORAL
  Filled 2023-11-09: qty 2
  Filled 2023-11-09 (×2): qty 1

## 2023-11-09 MED ORDER — VITAMIN B-12 1000 MCG PO TABS
1000.0000 ug | ORAL_TABLET | Freq: Every day | ORAL | Status: DC
  Administered 2023-11-10 – 2023-11-12 (×3): 1000 ug via ORAL
  Filled 2023-11-09: qty 2
  Filled 2023-11-09: qty 1
  Filled 2023-11-09: qty 2

## 2023-11-09 MED ORDER — GABAPENTIN 400 MG PO CAPS
400.0000 mg | ORAL_CAPSULE | Freq: Four times a day (QID) | ORAL | Status: DC
  Administered 2023-11-09 – 2023-11-12 (×9): 400 mg via ORAL
  Filled 2023-11-09 (×9): qty 1

## 2023-11-09 MED ORDER — SODIUM CHLORIDE 0.9% FLUSH: 3.0000 mL | INTRAVENOUS | Status: DC | PRN

## 2023-11-09 MED ORDER — SODIUM CHLORIDE 0.9 % IV SOLN: 250.0000 mL | INTRAVENOUS | Status: AC | PRN

## 2023-11-09 NOTE — H&P (Signed)
 History and Physical    Patient: Catherine Green [id number] DOB: March 26, 1941 DOA: 11/09/2023 DOS: the patient was seen and examined on 11/09/2023 PCP: Joaquim Nam, MD  Patient coming from: Home  Chief Complaint:  Chief Complaint  Patient presents with   Shortness of Breath   HPI: JONNELLE LAWNICZAK is a 83 y.o. female with medical history significant of HFpEF, pulmonary hypertension, CAD s/p CABG (2009), atrial tachycardia, carotid artery disease, hypertension, hyperlipidemia, who presents to the ED due to shortness of breath.  Mrs. Waskey states that for the last couple days, she has been experiencing shortness of breath, orthopnea and worsening lower extremity swelling.  She endorses some abdominal bloating but denies noting any frank distention.  She notes that she has been taking Lasix 20 mg daily as instructed, however this dose is much lower than she was previously on a few years ago, and wonders if she needs to be on a higher dose again.  Otherwise, she denies any recent fever, chills, nausea, vomiting, chest pain, palpitations.  She notes that her sinuses have been bothering her and she has been experiencing a sinus headache.  She endorses chronic voice hoarseness that has been ongoing for years and is unchanged.  ED course: On arrival to the ED, patient was hypertensive at 167/72 with heart rate of 70.  She was saturating at 100% on 2 L.  She was afebrile at 98.1.  Initial workup notable for hemoglobin of 11.4 and normal BNP.  BNP elevated at 204.  Troponin negative at 8.  Chest x-ray notable for chronic interstitial markings with superimposed opacity concerning for edema with bilateral pleural effusions.  Lower extremity Dopplers ordered and pending.  TRH contacted for admission.  Review of Systems: As mentioned in the history of present illness. All other systems reviewed and are negative.  Past Medical History:  Diagnosis Date   Allergy    hay fever   Anemia    Anxiety     Back pain    Carotid artery occlusion    Cerebrovascular disease    extracranial; occlusive   Chicken pox    Coronary artery disease    Depression    Dizziness    Dizziness    DVT (deep venous thrombosis) (HCC)    Fainting spell    GERD (gastroesophageal reflux disease)    Headache    Heart murmur    Hyperlipidemia    Hypertension    Leg pain    Mitral regurgitation    PONV (postoperative nausea and vomiting)    severe nausea and vomiting   Pre-syncope    PVD (peripheral vascular disease) (HCC)    endarterectomy by Dr. Arbie Cookey   Seasonal allergies    Shortness of breath dyspnea    wth ambulation at times   Swelling of both ankles    and abdomen; takes Lasix when needed   Thrombophlebitis    following childbirth   Ulcer    Past Surgical History:  Procedure Laterality Date   ANTERIOR CERVICAL DECOMP/DISCECTOMY FUSION N/A 01/11/2023   Procedure: Anterior Cervical Decompression/Discectomy Fusion Cervical Five-Cervical Six - Cervical Six-Cervical Seven;  Surgeon: Julio Sicks, MD;  Location: [facility] OR;  Service: Neurosurgery;  Laterality: N/A;  3C   APPENDECTOMY     BREAST SURGERY     saline implants   CARDIAC CATHETERIZATION     CAROTID ENDARTERECTOMY  1992   CAROTID ENDARTERECTOMY Right 02/13/2005   Re-do Right CE   CATARACT EXTRACTION, BILATERAL  2013  CHOLECYSTECTOMY, LAPAROSCOPIC  04/02/2014   Dr. Renda Rolls   CORONARY ARTERY BYPASS GRAFT  2006   x3 Dr. Donata Clay   ENDARTERECTOMY Left 07/20/2014   Procedure: ENDARTERECTOMY CAROTID-LEFT;  Surgeon: Larina Earthly, MD;  Location: [facility] OR;  Service: Vascular;  Laterality: Left;   EYE SURGERY Bilateral 10/2011   Cataract Left eye   SEPTOPLASTY     with bilateral inferior turbinate reductions   SPINE SURGERY  12/2015   TOTAL ABDOMINAL HYSTERECTOMY     UPPER GASTROINTESTINAL ENDOSCOPY     had polyps removed from esophagus   UPPER GI ENDOSCOPY     Social History:  reports that she quit smoking about 27 years ago. Her  smoking use included cigarettes. She started smoking about 42 years ago. She has a 4.5 pack-year smoking history. She has never been exposed to tobacco smoke. She has never used smokeless tobacco. She reports that she does not drink alcohol and does not use drugs.  Allergies  Allergen Reactions   Lyrica [Pregabalin] Shortness Of Breath, Swelling and Other (See Comments)    chest tight   Hydrocodone Other (See Comments)    hallucinate   Ivp Dye [Iodinated Contrast Media] Hives and Itching        Lipitor [Atorvastatin] Nausea And Vomiting   Methylprednisolone Other (See Comments)    Severe weekness    Oxycodone Hcl Other (See Comments)    Hallucinations    Penicillin G Nausea And Vomiting   Shellfish Allergy Hives and Itching   Buspar [Buspirone]     Worsening mood   Clavulanic Acid    Prednisone    Codeine Nausea And Vomiting and Other (See Comments)   Cymbalta [Duloxetine Hcl] Nausea Only    GI upset at 30mg    Demerol Nausea And Vomiting   Doxycycline Nausea Only and Other (See Comments)    Weakness, sick to her stomach   Effexor [Venlafaxine] Nausea Only   Hydrochlorothiazide Other (See Comments)    Not an allergy but urinary frequency and sweating at 25mg    Hydroxyzine Other (See Comments)    Excessive sweating.    Metoprolol Other (See Comments)    Slowed body down, per pt    Protonix [Pantoprazole Sodium] Nausea Only   Trazodone And Nefazodone Other (See Comments)    Sedation     Family History  Problem Relation Age of Onset   Aneurysm Mother        brain   Heart disease Mother        Aneyursm    Hyperlipidemia Mother    Hypertension Mother    Varicose Veins Mother    Bleeding Disorder Mother    Heart disease Father    Cirrhosis Father    Heart attack Father    Hyperlipidemia Father    Hypertension Father    Cancer Sister        lung   Heart disease Sister        Aneurysm   Hyperlipidemia Sister    Hypertension Sister    Varicose Veins Sister     Bleeding Disorder Sister    Heart disease Brother        Before age 45   Aneurysm Brother    Deep vein thrombosis Brother    Birth defects Brother    Hyperlipidemia Brother    Hypertension Brother    Peripheral vascular disease Daughter    Hyperlipidemia Daughter    Hypertension Daughter    Hypertension Other    Breast cancer  Neg Hx    Colon cancer Neg Hx     Prior to Admission medications   Medication Sig Start Date End Date Taking? Authorizing Provider  albuterol (VENTOLIN HFA) 108 (90 Base) MCG/ACT inhaler Inhale 2 puffs into the lungs every 6 (six) hours as needed for wheezing or shortness of breath. 10/26/22   Joaquim Nam, MD  amLODipine (NORVASC) 2.5 MG tablet Take 0.5 tablets (1.25 mg total) by mouth in the morning. 12/28/22   Sondra Barges, PA-C  aspirin 81 MG tablet Take 1 tablet (81 mg total) by mouth daily. 03/29/18   Antonieta Iba, MD  Coenzyme Q10 (CO Q10) 100 MG CAPS Take 100 mg by mouth daily.    [provider]  ezetimibe (ZETIA) 10 MG tablet Take 1 tablet (10 mg total) by mouth daily. 12/16/22   Dunn, Raymon Mutton, PA-C  furosemide (LASIX) 20 MG tablet TAKE 1 TABLET BY MOUTH DAILY. 02/10/23   Antonieta Iba, MD  gabapentin (NEURONTIN) 400 MG capsule Take 1 capsule (400 mg total) by mouth 4 (four) times daily. 09/20/23   Joaquim Nam, MD  ipratropium (ATROVENT) 0.03 % nasal spray  05/26/23   [provider]  loratadine (CLARITIN) 10 MG tablet Take 10 mg by mouth daily.    [provider]  metoprolol succinate (TOPROL-XL) 25 MG 24 hr tablet TAKE 1/2 TABLET AT BEDTIME 10/05/23   Dunn, Raymon Mutton, PA-C  omeprazole (PRILOSEC) 20 MG capsule Take 1 capsule (20 mg total) by mouth 2 (two) times daily before a meal. 02/05/22   Joaquim Nam, MD  polyethylene glycol powder (GLYCOLAX/MIRALAX) 17 GM/SCOOP powder Take 17 g by mouth 2 (two) times daily as needed. 10/26/22   Joaquim Nam, MD  potassium chloride (KLOR-CON M) 10 MEQ tablet Take 10 mEq by mouth  daily. 03/11/23   [provider]  rosuvastatin (CRESTOR) 20 MG tablet Take 1 tablet (20 mg total) by mouth daily. Overdue follow up visit. PLEASE CALL OFFICE TO SCHEDULE APPOINTMENT PRIOR TO NEXT REFILL 09/09/23   Antonieta Iba, MD  sertraline (ZOLOFT) 100 MG tablet TAKE ONE AND A HALF TABLETS BY MOUTH EVERY DAY 11/23/22   Joaquim Nam, MD  vitamin B-12 (CYANOCOBALAMIN) 1000 MCG tablet Take 1 tablet (1,000 mcg total) by mouth daily. 04/24/18   Joaquim Nam, MD    Physical Exam: Vitals:   11/09/23 2057 11/09/23 2100 11/09/23 2130 11/09/23 2200  BP:  (!) 177/59 (!) 176/78 (!) 170/68  Pulse:  69 71 66  Resp:  17 18 14   Temp:      TempSrc:      SpO2: 100% 100% 100% 98%   Physical Exam Vitals and nursing note reviewed.  Constitutional:      Appearance: She is normal weight.  HENT:     Head: Normocephalic and atraumatic.     Mouth/Throat:     Mouth: Mucous membranes are moist.     Pharynx: Oropharynx is clear.  Eyes:     Extraocular Movements: Extraocular movements intact.     Pupils: Pupils are equal, round, and reactive to light.  Cardiovascular:     Rate and Rhythm: Normal rate. Rhythm irregular.  Pulmonary:     Effort: Pulmonary effort is normal. Tachypnea present.     Breath sounds: Examination of the right-lower field reveals rales. Examination of the left-lower field reveals rales. Rales present.  Abdominal:     Palpations: Abdomen is soft.     Tenderness: There is  no abdominal tenderness.  Musculoskeletal:     Right lower leg: 1+ Pitting Edema present.     Left lower leg: 1+ Pitting Edema present.  Skin:    General: Skin is warm and dry.  Neurological:     General: No focal deficit present.     Mental Status: She is alert and oriented to person, place, and time.  Psychiatric:        Mood and Affect: Mood normal.        Behavior: Behavior normal.    Data Reviewed: CBC with WBC of 6.1, hemoglobin of 11.4, platelets of 256 BMP with sodium of 138,  potassium 4.4, bicarb 24, BUN 16, creatinine 0.72 with GFR above 60 Troponin 8 BNP 204  EKG personally reviewed.  P waves are minimal not consistently seen, in addition to irregular, concerning for atrial fibrillation.  Versus sinus arrhythmia.  No acute ischemic changes.  DG Chest 2 View Result Date: 11/09/2023 CLINICAL DATA:  SOB EXAM: CHEST - 2 VIEW COMPARISON:  Chest x-ray 01/22/2020 FINDINGS: The heart and mediastinal contours are unchanged. Atherosclerotic plaque. Surgical changes overlie the mediastinum. No focal consolidation. Chronic coarsened interstitial markings with superimposed bilateral perihilar interstitial and airspace opacities. Bilateral trace to small volume pleural effusions, left greater than right. No pneumothorax. No acute osseous abnormality. Sternotomy wires are intact. Cervical spine surgical hardware. IMPRESSION: 1. Chronic coarsened interstitial markings with superimposed bilateral perihilar interstitial and airspace opacities. Findings could represent pulmonary edema versus infection/inflammation. 2. Bilateral trace to small volume pleural effusions, left greater than right. 3.  Aortic Atherosclerosis (ICD10-I70.0). Electronically Signed   By: Tish Frederickson M.D.   On: 11/09/2023 19:37   Results are pending, will review when available.  Assessment and Plan:  Acute on chronic heart failure with preserved ejection fraction (HFpEF) (HCC) Per chart review, patient has a history of HFpEF with last EF of 60-65% with grade 2 diastolic dysfunction, last evaluated in January 2023.  Last follow-up with GI was in August 2024. Unclear etiology of exacerbation at this time.   - Telemetry monitoring - Update echocardiogram - Lasix 40 mg IV daily - Strict in and out - Daily weights - Daily BMP and magnesium  Acute hypoxic respiratory failure (HCC) In the setting of pulmonary edema.  - Continue supplemental oxygen to maintain oxygen saturation above 88% - Wean as  tolerated  CAD, ARTERY BYPASS GRAFT No reported chest pain at this time.  - Resume home aspirin, statin, Zetia  Essential hypertension - Resume home amlodipine, metoprolol tomorrow  Advance Care Planning:   Code Status: Full Code verified  Consults: None  Family Communication: No family at bedside  Severity of Illness: The appropriate patient status for this patient is INPATIENT. Inpatient status is judged to be reasonable and necessary in order to provide the required intensity of service to ensure the patient's safety. The patient's presenting symptoms, physical exam findings, and initial radiographic and laboratory data in the context of their chronic comorbidities is felt to place them at high risk for further clinical deterioration. Furthermore, it is not anticipated that the patient will be medically stable for discharge from the hospital within 2 midnights of admission.   * I certify that at the point of admission it is my clinical judgment that the patient will require inpatient hospital care spanning beyond 2 midnights from the point of admission due to high intensity of service, high risk for further deterioration and high frequency of surveillance required.*  Author: Verdene Lennert, MD 11/09/2023  10:42 PM  For on call review [email or web address].

## 2023-11-09 NOTE — Progress Notes (Unsigned)
 Initial pulse ox 84%.  Recheck pulse ox 96% on 2L O2.   Several days of cough, hoarse voice.  More SOB supine, betting sitting up.  HA recent noted. Rhinorrhea.  Some BLE edema, esp R ankle.  No fevers.    Verified pulse ox down to 88% when off O2 briefly. Then back to 96% on 2L O2.   Weight is up slightly.    Meds, vitals, and allergies reviewed.   ROS: Per HPI unless specifically indicated in ROS section   Nad but looks like she doesn't feel well.  Neck suple RRR  Dec BS B lung bases.  Abd soft, not ttp Trace BLE edema.   EMS called after getting patient consent for transport.   30 minutes were devoted to patient care in this encounter (this includes time spent reviewing the patient's file/history, interviewing and examining the patient, counseling/reviewing plan with patient, contacting 911, discussion with paramedics onsite).

## 2023-11-09 NOTE — Assessment & Plan Note (Signed)
-   Resume home amlodipine, metoprolol tomorrow

## 2023-11-09 NOTE — ED Notes (Signed)
 First Nurse Note: Pt to ED via [facility] from her PCP office for shortness of breath. Pt also having difficulty sleeping laying flat. Pt was 96% on 4 liters, sats at the doctors was in the 80's. Pt is currently in NAD.

## 2023-11-09 NOTE — ED Provider Notes (Signed)
 [facility] Provider Note    Event Date/Time   First MD Initiated Contact with Patient 11/09/23 2012     (approximate)   History   Shortness of Breath   HPI  Catherine Green is a 83 y.o. female who comes in with increasing shortness of breath.  Patient had initial pulse ox of 84% and placed on 2 L due to increasing weight gain, some lower extremity edema.  Patient sent to the emergency room for evaluation.  I reviewed the note from Dr. Para March today.  Patient reports that she is on Lasix.  Denies any history of atrial fibrillation.  She does report that her right leg has been hurting and she has some increased swelling in the right more than the left.  Denies any abdominal pain.  She reports just having some fevers, cough.  Physical Exam   Triage Vital Signs: ED Triage Vitals  Encounter Vitals Group     BP 11/09/23 1656 (!) 157/70     Systolic BP Percentile --      Diastolic BP Percentile --      Pulse Rate 11/09/23 1656 69     Resp 11/09/23 1656 (!) 28     Temp 11/09/23 1656 98.6 F (37 C)     Temp Source 11/09/23 1656 Oral     SpO2 11/09/23 1656 97 %     Weight --      Height --      Head Circumference --      Peak Flow --      Pain Score 11/09/23 1657 0     Pain Loc --      Pain Education --      Exclude from Growth Chart --     Most recent vital signs: Vitals:   11/09/23 1656  BP: (!) 157/70  Pulse: 69  Resp: (!) 28  Temp: 98.6 F (37 C)  SpO2: 97%     General: Awake, no distress.  CV:  Good peripheral perfusion.  Resp:  Normal effort.  Clear lungs Abd:  No distention.  Other:  Swelling noted in legs worse on the right.   ED Results / Procedures / Treatments   Labs (all labs ordered are listed, but only abnormal results are displayed) Labs Reviewed  CBC - Abnormal; Notable for the following components:      Result Value   Hemoglobin 11.4 (*)    All other components within normal limits  BRAIN NATRIURETIC PEPTIDE -  Abnormal; Notable for the following components:   B Natriuretic Peptide 204.2 (*)    All other components within normal limits  RESP PANEL BY RT-PCR (RSV, FLU A&B, COVID)  [id number]  BASIC METABOLIC PANEL  TROPONIN I (HIGH SENSITIVITY)  TROPONIN I (HIGH SENSITIVITY)     EKG  My interpretation of EKG:  Atrial fibrillation with a rate of 71 without any ST elevation or T wave inversions, normal intervals  RADIOLOGY I have reviewed the xray personally and interpreted positive for edema   PROCEDURES:  Critical Care performed: Yes, see critical care procedure note(s)  .Critical Care  Performed by: Concha Se, MD Authorized by: Concha Se, MD   Critical care provider statement:    Critical care time (minutes):  30   Critical care was necessary to treat or prevent imminent or life-threatening deterioration of the following conditions:  Respiratory failure   Critical care was time spent personally by me on the following activities:  Development of  treatment plan with patient or surrogate, discussions with consultants, evaluation of patient's response to treatment, examination of patient, ordering and review of laboratory studies, ordering and review of radiographic studies, ordering and performing treatments and interventions, pulse oximetry, re-evaluation of patient's condition and review of old charts .1-3 Lead EKG Interpretation  Performed by: Concha Se, MD Authorized by: Concha Se, MD     Interpretation: abnormal     ECG rate:  70   ECG rate assessment: normal     Rhythm: atrial fibrillation     Ectopy: none     Conduction: normal      MEDICATIONS ORDERED IN ED: Medications  furosemide (LASIX) injection 40 mg (40 mg Intravenous Given 11/09/23 2049)     IMPRESSION / MDM / ASSESSMENT AND PLAN / ED COURSE  I reviewed the triage vital signs and the nursing notes.   Patient's presentation is most consistent with acute presentation with potential threat to life  or bodily function.   Patient comes in with shortness of breath and new hypoxia patient currently on 2 L.  BNP is elevated CBC shows no elevation of white count no fevers to suggest infection.  Her BMP shows normal creatinine.  Troponin was negative.  Chest x-ray is concerning for edema.  Possible infection but patient has normal white count no fever will add on procalcitonin but this time it seems more consistent with CHF I will add also at get a COVID, flu.  I will discuss with the hospital team for admission given new oxygen requirement and new afib.   The patient is on the cardiac monitor to evaluate for evidence of arrhythmia and/or significant heart rate changes.      FINAL CLINICAL IMPRESSION(S) / ED DIAGNOSES   Final diagnoses:  Acute on chronic congestive heart failure, unspecified heart failure type (HCC)  Atrial fibrillation, unspecified type (HCC)     Rx / DC Orders   ED Discharge Orders     None        Note:  This document was prepared using Dragon voice recognition software and may include unintentional dictation errors.   Concha Se, MD 11/09/23 [phone]

## 2023-11-09 NOTE — Assessment & Plan Note (Addendum)
 Per chart review, patient has a history of HFpEF with last EF of 60-65% with grade 2 diastolic dysfunction, last evaluated in January 2023.  Last follow-up with GI was in August 2024. Unclear etiology of exacerbation at this time.   - Telemetry monitoring - Update echocardiogram - Lasix 40 mg IV daily - Strict in and out - Daily weights - Daily BMP and magnesium

## 2023-11-09 NOTE — ED Notes (Signed)
 CCMD aware to be monitoring patient

## 2023-11-09 NOTE — Assessment & Plan Note (Signed)
 In the setting of pulmonary edema.  - Continue supplemental oxygen to maintain oxygen saturation above 88% - Wean as tolerated

## 2023-11-09 NOTE — ED Triage Notes (Addendum)
 Pt to ED via [facility] from PCP. Pt was seen at office for increasing SOB. Sats at PCP 80s. Pt placed on 4L [place] and is von 4L [place] on arrival. Sats 99% in triage on 4L [place]. Pt denies CP. Pt reports leg swelling. Pt reports hx of CHF  Pt unable to speak in full sentences due to SOB

## 2023-11-09 NOTE — Patient Instructions (Signed)
 Visit Information  Thank you for taking time to visit with me today. Please don't hesitate to contact me if I can be of assistance to you.   Following are the goals we discussed today:   Goals Addressed             This Visit's Progress    Management and education of health conditions.       Interventions Today    Flowsheet Row Most Recent Value  Chronic Disease   Chronic disease during today's visit Other  [SOB/ wheezing/ congestion symptoms]  General Interventions   General Interventions Discussed/Reviewed General Interventions Reviewed, Communication with  [evaluation of current treatment plan for listed health condition and patients adherence to plan as established by provider.  Assessed severity of reported symptoms of SOB/ coughing x 3 days.]  Communication with PCP/Specialists  [Contacted priimary care provider office and spoke with Rosey Bath to schedule patient for acute visit with Dr. Para March for today 11/09/23.  Patient contacted and notified of 3 pm appointment with primary provider on 11/09/23.]  Education Interventions   Education Provided Provided Education  [Patient advised to call 911 / go to ED for worsening SOB.]              Our next appointment is by telephone on 11/10/23 at 2:30 pm  Please call the care guide team at [phone] if you need to cancel or reschedule your appointment.   If you are experiencing a Mental Health or Behavioral Health Crisis or need someone to talk to, please call the Suicide and Crisis Lifeline: 988 call 1-800-273-TALK (toll free, 24 hour hotline)  Patient verbalizes understanding of instructions and care plan provided today and agrees to view in MyChart. Active MyChart status and patient understanding of how to access instructions and care plan via MyChart confirmed with patient.     George Ina RN, BSN, CCM [employer], Population Health Case Manager Phone: [phone]

## 2023-11-09 NOTE — Assessment & Plan Note (Signed)
 No reported chest pain at this time.  - Resume home aspirin, statin, Zetia

## 2023-11-09 NOTE — Patient Outreach (Signed)
 Care Coordination   Follow Up Visit Note   11/09/2023 Name: Catherine Green MRN: [id number] DOB: [birth year]  Catherine Green is a 83 y.o. year old female who sees Joaquim Nam, MD for primary care. I spoke with  [name] by phone today.  What matters to the patients health and wellness today?   Patient reports symptoms of SOB/ cough x 3 days. She reports using her inhaler this morning and taking coricidin.  Husband reports giving patient Dayquil today.  RN recommended patient be seen by primary care provider today and offered to call and schedule appointment.  Telephone call made to primary care provider office. Spoke with Rosey Bath who scheduled patient an acute care visit with primary care provider for today 11/09/23 at 3:00 pm.  Returned call to patient and spoke with spouse at the authorization of patient.  Notified spouse that patient has acute care visit appointment with primary care provider today at 3:00 pm.  Stressed with spouse that patient needs to arrive to appointment at 2:45 pm.  Also advised spouse/ patient if patient's SOB continues to worsen to go to the ED for care.     Goals Addressed             This Visit's Progress    Management and education of health conditions.       Interventions Today    Flowsheet Row Most Recent Value  Chronic Disease   Chronic disease during today's visit Other  [SOB/ wheezing/ congestion symptoms]  General Interventions   General Interventions Discussed/Reviewed General Interventions Reviewed, Communication with  [evaluation of current treatment plan for listed health condition and patients adherence to plan as established by provider.  Assessed severity of reported symptoms of SOB/ coughing x 3 days.]  Communication with PCP/Specialists  [Contacted priimary care provider office and spoke with Rosey Bath to schedule patient for acute visit with Dr. Para March for today 11/09/23.  Patient contacted and notified of 3 pm appointment with primary  provider on 11/09/23.]  Education Interventions   Education Provided Provided Education  [Patient advised to call 911 / go to ED for worsening SOB.]              SDOH assessments and interventions completed:  No     Care Coordination Interventions:  Yes, provided   Follow up plan: Follow up call scheduled for 11/10/23 at 2:30 pm    Encounter Outcome:  Patient Visit Completed   George Ina RN, BSN, CCM [place]  [facility], Population Health Case Manager Phone: [phone]

## 2023-11-10 ENCOUNTER — Telehealth (HOSPITAL_COMMUNITY): Payer: Self-pay | Admitting: Pharmacy Technician

## 2023-11-10 ENCOUNTER — Other Ambulatory Visit (HOSPITAL_COMMUNITY): Payer: Self-pay

## 2023-11-10 ENCOUNTER — Inpatient Hospital Stay (HOSPITAL_COMMUNITY)
Admit: 2023-11-10 | Discharge: 2023-11-10 | Disposition: A | Discharge status: Home / Self Care | Attending: Internal Medicine | Admitting: Internal Medicine

## 2023-11-10 DIAGNOSIS — I5031 Acute diastolic (congestive) heart failure: Secondary | ICD-10-CM | POA: Diagnosis not present

## 2023-11-10 DIAGNOSIS — I25708 Atherosclerosis of coronary artery bypass graft(s), unspecified, with other forms of angina pectoris: Secondary | ICD-10-CM

## 2023-11-10 DIAGNOSIS — J9601 Acute respiratory failure with hypoxia: Secondary | ICD-10-CM | POA: Diagnosis not present

## 2023-11-10 DIAGNOSIS — I10 Essential (primary) hypertension: Secondary | ICD-10-CM | POA: Diagnosis not present

## 2023-11-10 DIAGNOSIS — I5033 Acute on chronic diastolic (congestive) heart failure: Secondary | ICD-10-CM | POA: Diagnosis not present

## 2023-11-10 DIAGNOSIS — J441 Chronic obstructive pulmonary disease with (acute) exacerbation: Secondary | ICD-10-CM

## 2023-11-10 DIAGNOSIS — I25810 Atherosclerosis of coronary artery bypass graft(s) without angina pectoris: Secondary | ICD-10-CM | POA: Diagnosis not present

## 2023-11-10 LAB — BASIC METABOLIC PANEL
Anion gap: 12 (ref 5–15)
BUN: 15 mg/dL (ref 8–23)
CO2: 22 mmol/L (ref 22–32)
Calcium: 8.3 mg/dL — ABNORMAL LOW (ref 8.9–10.3)
Chloride: 102 mmol/L (ref 98–111)
Creatinine, Ser: 0.78 mg/dL (ref 0.44–1.00)
GFR, Estimated: >60 mL/min (ref >60)
Glucose, Bld: 92 mg/dL (ref 70–99)
Potassium: 3.9 mmol/L (ref 3.5–5.1)
Sodium: 136 mmol/L (ref 135–145)

## 2023-11-10 LAB — ECHOCARDIOGRAM COMPLETE
AR max vel: 0.82 cm2
AV Area VTI: 0.94 cm2
AV Area mean vel: 0.82 cm2
AV Mean grad: 4.6 mmHg
AV Peak grad: 8.3 mmHg
Ao pk vel: 1.44 m/s
Area-P 1/2: 3.15 cm2
Height: 62 in
MV VTI: 0.76 cm2
S' Lateral: 2.3 cm
Weight: 2275.2 [oz_av]

## 2023-11-10 LAB — MAGNESIUM: Magnesium: 2.2 mg/dL (ref 1.7–2.4)

## 2023-11-10 MED ORDER — FLUTICASONE FUROATE-VILANTEROL 100-25 MCG/ACT IN AEPB
1.0000 | INHALATION_SPRAY | Freq: Every day | RESPIRATORY_TRACT | Status: DC
  Administered 2023-11-11 – 2023-11-12 (×2): 1 via RESPIRATORY_TRACT
  Filled 2023-11-10: qty 28

## 2023-11-10 MED ORDER — FUROSEMIDE 10 MG/ML IJ SOLN
40.0000 mg | Freq: Two times a day (BID) | INTRAMUSCULAR | Status: DC
  Administered 2023-11-10 – 2023-11-11 (×3): 40 mg via INTRAVENOUS
  Filled 2023-11-10 (×2): qty 4

## 2023-11-10 NOTE — ED Notes (Signed)
 Pt has 2 hearing aids. Offers that 1 is beeping. Helped patient place 1 hearing aid in a specimen cup and placed specimen cup in patients left shoe setting at bedside. Pt reports that she wants to keep the other hearing aid for now.

## 2023-11-10 NOTE — ED Notes (Signed)
 Patient desated to 80% on RA ambulating 15 ft to the bathroom assisted

## 2023-11-10 NOTE — Progress Notes (Signed)
*  PRELIMINARY RESULTS* Echocardiogram 2D Echocardiogram has been performed.  Catherine Green 11/10/2023, 2:30 PM

## 2023-11-10 NOTE — Consult Note (Signed)
 Cardiology Consultation   Patient ID: Catherine Green MRN: [id number]; DOB: 1940/10/31  Admit date: 11/09/2023 Date of Consult: 11/10/2023  PCP:  Catherine Nam, MD   [facility] Providers Cardiologist:  Catherine Nordmann, MD       Patient Profile:   Catherine Green is a 83 y.o. female with a hx of CAD s/p three vessel CABG 2006, HFpEF, bilateral carotid artery disease s/p bilateral CEA followed by vascular surgery, HTN, HLD, frequent PVCs, atrial tachycardia, multiple mediation intolerances, back pain s/p surgery, and prior tobacco use from age [age] who is being seen 11/10/2023 for the evaluation of acute CHF exacerbation at the request of Dr. Sherryll Green.  History of Present Illness:   Catherine Green follows with [facility] for the above cardiac issues. She has a longstanding history of HFpEF with frequent fluctuations in weight and symptoms. She has labile hypertension and chronic positional dizziness. She has intolerance to several medications with frequent medication changes complicating care. Echo 09/15/2021 showed EF of 60 to 65%, no regional wall motion abnormalities, grade 2 diastolic dysfunction, normal RV systolic function and ventricular cavity size, mildly elevated PASP estimated at 43.7 mmHg, moderate mitral regurgitation, and an estimated right atrial pressure of 3 mmHg. Lexiscan MPI on 10/01/2021 showed no evidence of ischemia and was overall low risk. Coronary artery calcifications were noted in the LAD and RCA territories along with a sending and descending aortic atherosclerosis. She was most recently seen in the cardiology clinic for routine follow up 12/16/2022 and was doing very well from a cardiac perspective without symptoms of angina or cardiac decompensation. She did report that metoprolol was causing headache and it was discontinue with subsequent up-titration of amlodipine. No other medication changes were made. She was continued on amlodipine 1.25 mg BID, ASA 81 mg daily,  ezetimibe 10 mg daily, furosemide 20 mg daily, Kcl 10 mEq daily, and rosuvastatin 20 mg daily. GDMT limited by medication intolerance.   Patient was seen by PCP 3/11 for routine follow-up.  On arrival, initial pulse ox 84%, patient reportedly extremely short of breath with several days of cough. She denies chest pain, palpitations, nausea, and fever. EMS was called to transport patient to emergency room. In the ED, BP 157/70, HR 69, RR 28, T98.6 F, SpO2 97% on 2 L supplemental oxygen.  BMP and CBC within normal limits.  Troponin negative. BNP slightly elevated at 204.2. EKG without acute ischemic changes. Chest x-ray with findings concerning for edema and small bilateral pleural effusions. Venous ultrasound negative for DVT.  Pittore panel negative. Patient was given IV Lasix 40 mg.  Past Medical History:  Diagnosis Date   Allergy    hay fever   Anemia    Anxiety    Back pain    Carotid artery occlusion    Cerebrovascular disease    extracranial; occlusive   Chicken pox    Coronary artery disease    Depression    Dizziness    Dizziness    DVT (deep venous thrombosis) (HCC)    Fainting spell    GERD (gastroesophageal reflux disease)    Headache    Heart murmur    Hyperlipidemia    Hypertension    Leg pain    Mitral regurgitation    PONV (postoperative nausea and vomiting)    severe nausea and vomiting   Pre-syncope    PVD (peripheral vascular disease) (HCC)    endarterectomy by Dr. Arbie Green   Seasonal allergies    Shortness  of breath dyspnea    wth ambulation at times   Swelling of both ankles    and abdomen; takes Lasix when needed   Thrombophlebitis    following childbirth   Ulcer     Past Surgical History:  Procedure Laterality Date   ANTERIOR CERVICAL DECOMP/DISCECTOMY FUSION N/A 01/11/2023   Procedure: Anterior Cervical Decompression/Discectomy Fusion Cervical Five-Cervical Six - Cervical Six-Cervical Seven;  Surgeon: Catherine Sicks, MD;  Location: [facility] OR;  Service:  Neurosurgery;  Laterality: N/A;  3C   APPENDECTOMY     BREAST SURGERY     saline implants   CARDIAC CATHETERIZATION     CAROTID ENDARTERECTOMY  1992   CAROTID ENDARTERECTOMY Right 02/13/2005   Re-do Right CE   CATARACT EXTRACTION, BILATERAL  2013   CHOLECYSTECTOMY, LAPAROSCOPIC  04/02/2014   Dr. Renda Green   CORONARY ARTERY BYPASS GRAFT  2006   x3 Dr. Donata Green   ENDARTERECTOMY Left 07/20/2014   Procedure: ENDARTERECTOMY CAROTID-LEFT;  Surgeon: Catherine Earthly, MD;  Location: [facility] OR;  Service: Vascular;  Laterality: Left;   EYE SURGERY Bilateral 10/2011   Cataract Left eye   SEPTOPLASTY     with bilateral inferior turbinate reductions   SPINE SURGERY  12/2015   TOTAL ABDOMINAL HYSTERECTOMY     UPPER GASTROINTESTINAL ENDOSCOPY     had polyps removed from esophagus   UPPER GI ENDOSCOPY         Inpatient Medications: Scheduled Meds:  amLODipine  1.25 mg Oral q AM   aspirin EC  81 mg Oral Daily   cyanocobalamin  1,000 mcg Oral Daily   enoxaparin (LOVENOX) injection  40 mg Subcutaneous Q24H   ezetimibe  10 mg Oral Daily   furosemide  40 mg Intravenous Daily   gabapentin  400 mg Oral QID   metoprolol succinate  12.5 mg Oral QHS   rosuvastatin  20 mg Oral Daily   sertraline  150 mg Oral Daily   sodium chloride flush  3 mL Intravenous Q12H   Continuous Infusions:  sodium chloride     PRN Meds: sodium chloride, acetaminophen, albuterol, ondansetron (ZOFRAN) IV, sodium chloride flush  Allergies:    Allergies  Allergen Reactions   Lyrica [Pregabalin] Shortness Of Breath, Swelling and Other (See Comments)    chest tight   Hydrocodone Other (See Comments)    hallucinate   Ivp Dye [Iodinated Contrast Media] Hives and Itching        Lipitor [Atorvastatin] Nausea And Vomiting   Methylprednisolone Other (See Comments)    Severe weekness    Oxycodone Hcl Other (See Comments)    Hallucinations    Penicillin G Nausea And Vomiting   Shellfish Allergy Hives and Itching    Buspar [Buspirone]     Worsening mood   Clavulanic Acid    Prednisone    Codeine Nausea And Vomiting and Other (See Comments)   Cymbalta [Duloxetine Hcl] Nausea Only    GI upset at 30mg    Demerol Nausea And Vomiting   Doxycycline Nausea Only and Other (See Comments)    Weakness, sick to her stomach   Effexor [Venlafaxine] Nausea Only   Hydrochlorothiazide Other (See Comments)    Not an allergy but urinary frequency and sweating at 25mg    Hydroxyzine Other (See Comments)    Excessive sweating.    Metoprolol Other (See Comments)    Slowed body down, per pt    Protonix [Pantoprazole Sodium] Nausea Only   Trazodone And Nefazodone Other (See Comments)  Sedation     Social History:   Social History   Socioeconomic History   Marital status: Married    Spouse name: Fayrene Fearing    Number of children: 1   Years of education: Not on file   Highest education level: 11th grade  Occupational History    Comment: Retired   Tobacco Use   Smoking status: Former    Current packs/day: 0.00    Average packs/day: 0.3 packs/day for 15.0 years (4.5 ttl pk-yrs)    Types: Cigarettes    Start date: 08/31/1981    Quit date: 08/31/1996    Years since quitting: 27.2    Passive exposure: Never   Smokeless tobacco: Never  Vaping Use   Vaping status: Never Used  Substance and Sexual Activity   Alcohol use: No    Alcohol/week: 0.0 standard drinks of alcohol   Drug use: No   Sexual activity: Not Currently  Other Topics Concern   Not on file  Social History Narrative   Lives with husband in a one level    Work - retired Producer, television/film/video   Diet - healthy   Right handed    Caffeine- 1 cup per day.      Had 1 daughter. Daughter is deceased   Social Drivers of Corporate investment banker Strain: Low Risk  (05/25/2023)   Overall Financial Resource Strain (CARDIA)    Difficulty of Paying Living Expenses: Not hard at all  Food Insecurity: No Food Insecurity (08/18/2023)   Hunger Vital Sign    Worried  About Running Out of Food in the Last Year: Never true    Ran Out of Food in the Last Year: Never true  Transportation Needs: No Transportation Needs (08/18/2023)   PRAPARE - Administrator, Civil Service (Medical): No    Lack of Transportation (Non-Medical): No  Physical Activity: Sufficiently Active (05/25/2023)   Exercise Vital Sign    Days of Exercise per Week: 5 days    Minutes of Exercise per Session: 30 min  Stress: No Stress Concern Present (05/25/2023)   [employer] of Occupational Health - Occupational Stress Questionnaire    Feeling of Stress : Not at all  Social Connections: Moderately Integrated (05/25/2023)   Social Connection and Isolation Panel [NHANES]    Frequency of Communication with Friends and Family: More than three times a week    Frequency of Social Gatherings with Friends and Family: More than three times a week    Attends Religious Services: More than 4 times per year    Active Member of [employer] or Organizations: No    Attends Banker Meetings: Never    Marital Status: Married  Catering manager Violence: Not At Risk (08/18/2023)   Humiliation, Afraid, Rape, and Kick questionnaire    Fear of Current or Ex-Partner: No    Emotionally Abused: No    Physically Abused: No    Sexually Abused: No    Family History:    Family History  Problem Relation Age of Onset   Aneurysm Mother        brain   Heart disease Mother        Aneyursm    Hyperlipidemia Mother    Hypertension Mother    Varicose Veins Mother    Bleeding Disorder Mother    Heart disease Father    Cirrhosis Father    Heart attack Father    Hyperlipidemia Father    Hypertension Father    Cancer Sister  lung   Heart disease Sister        Aneurysm   Hyperlipidemia Sister    Hypertension Sister    Varicose Veins Sister    Bleeding Disorder Sister    Heart disease Brother        Before age 35   Aneurysm Brother    Deep vein thrombosis Brother    Birth  defects Brother    Hyperlipidemia Brother    Hypertension Brother    Peripheral vascular disease Daughter    Hyperlipidemia Daughter    Hypertension Daughter    Hypertension Other    Breast cancer Neg Hx    Colon cancer Neg Hx      ROS:  Please see the history of present illness.    Physical Exam/Data:   Vitals:   11/10/23 1000 11/10/23 1040 11/10/23 1045 11/10/23 1300  BP: (!) 131/46     Pulse: 62  68 70  Resp: 15  18 19   Temp:  98.3 F (36.8 C)    TempSrc:  Oral    SpO2: 96%  93% 95%    Intake/Output Summary (Last 24 hours) at 11/10/2023 1413 Last data filed at 11/09/2023 2315 Gross per 24 hour  Intake --  Output 950 ml  Net -950 ml      11/09/2023    3:00 PM 11/05/2023   12:27 PM 09/20/2023   12:26 PM  Last 3 Weights  Weight (lbs) 142 lb 3.2 oz 140 lb 138 lb  Weight (kg) 64.501 kg 63.504 kg 62.596 kg     There is no height or weight on file to calculate BMI.  General:  Well nourished, well developed, in no acute distress HEENT: normal Neck: no JVD Cardiac:  normal S1, S2; RRR; no murmur Lungs: Crackles at the bases bilaterally Abd: soft, nontender, no hepatomegaly  Ext: Trace LE edema Skin: warm and dry  Psych:  Normal affect   EKG:  The EKG was personally reviewed and demonstrates:  Sinus arrhythmia, rate 71 bpm Telemetry:  Telemetry was personally reviewed and demonstrates: Sinus rhythm with rare PVC  Relevant CV Studies:  11/10/2023 Echo complete 1. Left ventricular ejection fraction, by estimation, is 60 to 65%. The  left ventricle has normal function. The left ventricle has no regional  wall motion abnormalities. Left ventricular diastolic parameters are  indeterminate.   2. Right ventricular systolic function is normal. The right ventricular  size is normal. There is normal pulmonary artery systolic pressure. The  estimated right ventricular systolic pressure is 20.8 mmHg.   3. The mitral valve is normal in structure. Mild mitral valve   regurgitation. No evidence of mitral stenosis.   4. The aortic valve is normal in structure. Aortic valve regurgitation is  not visualized. Aortic valve sclerosis is present, with no evidence of  aortic valve stenosis.   5. The inferior vena cava is normal in size with greater than 50%  respiratory variability, suggesting right atrial pressure of 3 mmHg.   Laboratory Data:  High Sensitivity Troponin:   Recent Labs  Lab 11/09/23 1707 11/09/23 2044  TROPONINIHS 8 8     Chemistry Recent Labs  Lab 11/09/23 1707 11/10/23 0327  NA 138 136  K 4.4 3.9  CL 103 102  CO2 24 22  GLUCOSE 95 92  BUN 16 15  CREATININE 0.72 0.78  CALCIUM 9.0 8.3*  MG  --  2.2  GFRNONAA >60 >60  ANIONGAP 11 12    No results for input(s): "PROT", "ALBUMIN", "  AST", "ALT", "ALKPHOS", "BILITOT" in the last 168 hours. Lipids No results for input(s): "CHOL", "TRIG", "HDL", "LABVLDL", "LDLCALC", "CHOLHDL" in the last 168 hours.  Hematology Recent Labs  Lab 11/09/23 1707  WBC 6.1  RBC 4.32  HGB 11.4*  HCT 36.8  MCV 85.2  MCH 26.4  MCHC 31.0  RDW 14.8  PLT 256   Thyroid No results for input(s): "TSH", "FREET4" in the last 168 hours.  BNP Recent Labs  Lab 11/09/23 1707  BNP 204.2*    DDimer No results for input(s): "DDIMER" in the last 168 hours.   Radiology/Studies:  US Venous Img Lower Unilateral Right Result Date: 11/09/2023 IMPRESSION: No evidence of right lower extremity DVT. Electronically Signed   By: Narda Rutherford M.D.   On: 11/09/2023 23:03   DG Chest 2 View Result Date: 11/09/2023 IMPRESSION: 1. Chronic coarsened interstitial markings with superimposed bilateral perihilar interstitial and airspace opacities. Findings could represent pulmonary edema versus infection/inflammation. 2. Bilateral trace to small volume pleural effusions, left greater than right. 3.  Aortic Atherosclerosis (ICD10-I70.0). Electronically Signed   By: Tish Frederickson M.D.   On: 11/09/2023 19:37    Assessment and Plan:   Acute on chronic HFpEF - Echo 09/15/2021 showed EF of 60 to 65%, no RWMA, G2DD, normal RV, mildly elevated PASP, moderate MR - Presented 3/11 from PCP office with hypoxia and cough x2 days - BNP 204 - Given IV Lasix 40 mg x2 in the ED with -950 mL output, no input recorded - Repeat echo shows LVEF 60-65% - Remains volume overloaded on exam - Increase IV Lasix to 40 mg BID - Continue to monitor kidney function, strict I/Os, and daily weights - Continue PTA metoprolol succinate 12.5 mg - GDMT limited by medication intolerance  Acute hypoxic respiratory failure - Currently requiring 2 L supplemental O2 - In the setting of pulmonary edema, suspect a component of COPD with 30 year smoking history - Could benefit from nebulizer treatment although patient reports allergy to oral prednisone  CAD - No chest pain - Continue PTA ASA, statin, and Zetia  Hypertension - BP stable, continue PTA metoprolol and amlodipine  For questions or updates, please contact [facility] Please consult [email or web address] for contact info under    Signed, Orion Crook, PA-C  11/10/2023 2:13 PM

## 2023-11-10 NOTE — Progress Notes (Signed)
*  PRELIMINARY RESULTS* Echocardiogram 2D Echocardiogram has been performed.  Cristela Blue 11/10/2023, 2:30 PM

## 2023-11-10 NOTE — Telephone Encounter (Signed)
 Patient Product/process development scientist completed.    The patient is insured through [employer]. Patient has Medicare and is not eligible for a copay card, but may be able to apply for patient assistance or Medicare RX Payment Plan (Patient Must reach out to their plan, if eligible for payment plan), if available.    Ran test claim for Farxiga 10 mg and the current 30 day co-pay is $151.05 due to a deductible.  Ran test claim for Jardiance 10 mg and the current 30 day co-pay is $158.54 due to a deductible.   This test claim was processed through [facility]- copay amounts may vary at other pharmacies due to pharmacy/plan contracts, or as the patient moves through the different stages of their insurance plan.     Roland Earl, CPHT Pharmacy Technician III Certified Patient Advocate [facility] Patient Advocate Team Direct Number: [phone]  Fax: [phone]

## 2023-11-10 NOTE — Evaluation (Signed)
 Occupational Therapy Evaluation Patient Details Name: Catherine Green MRN: [id number] DOB: 05-28-1941 Today's Date: 11/10/2023   History of Present Illness   Pt is an 83 yo female that presented to the ED for SOB. PMH of C5-6 C6-7 ACDF, x 2 other back surgeries, HFpEF, pulmonary HTN, CAD s/p CABG, atrial tachycardia, carotid artery disease, HTN, HLD, DVT.     Clinical Impressions Patient presenting with decreased Ind in self care,balance, functional mobility/transfers, endurance, and safety awareness. Patient reports living at home with spouse and being Ind in self care and IADLs. Pt occasionally uses AD for mobility when needed.  Patient currently functioning min guard for balance to ambulate to bathroom on RA. Pt able to have BM and performs hygiene while seated. Pt returning to ED stretcher with min A and needing to be placed on 1L O2 via [facility] as pt's O2 saturation at 85% and unable to recover further without supplemental O2. Patient will benefit from acute OT to increase overall independence in the areas of ADLs, functional mobility, and safety awareness in order to safely discharge.     If plan is discharge home, recommend the following:   A little help with walking and/or transfers;A little help with bathing/dressing/bathroom;Assistance with cooking/housework;Assist for transportation;Help with stairs or ramp for entrance     Functional Status Assessment   Patient has had a recent decline in their functional status and demonstrates the ability to make significant improvements in function in a reasonable and predictable amount of time.     Equipment Recommendations   None recommended by OT      Precautions/Restrictions   Precautions Precautions: Fall Recall of Precautions/Restrictions: Intact     Mobility Bed Mobility   Bed Mobility: Sit to Supine       Sit to supine: Contact guard assist        Transfers Overall transfer level: Needs assistance Equipment  used: 1 person hand held assist, 2 person hand held assist Transfers: Bed to chair/wheelchair/BSC, Sit to/from Stand Sit to Stand: Contact guard assist     Step pivot transfers: Contact guard assist            Balance Overall balance assessment: Needs assistance Sitting-balance support: Feet supported Sitting balance-Leahy Scale: Good     Standing balance support: Bilateral upper extremity supported, During functional activity Standing balance-Leahy Scale: Fair                             ADL either performed or assessed with clinical judgement   ADL Overall ADL's : Needs assistance/impaired     Grooming: Wash/dry hands;Standing;Contact guard assist                   Toilet Transfer: Minimal Actuary and Hygiene: Contact guard assist;Sit to/from stand               Vision Patient Visual Report: No change from baseline              Pertinent Vitals/Pain Pain Assessment Pain Assessment: No/denies pain     Extremity/Trunk Assessment Upper Extremity Assessment Upper Extremity Assessment: Generalized weakness   Lower Extremity Assessment Lower Extremity Assessment: Generalized weakness       Communication Communication Communication: Impaired Factors Affecting Communication: Hearing impaired   Cognition Arousal: Alert Behavior During Therapy: WFL for tasks assessed/performed  Following commands: Intact       Cueing  General Comments   Cueing Techniques: Verbal cues              Home Living Family/patient expects to be discharged to:: Private residence Living Arrangements: Spouse/significant other Available Help at Discharge: Family;Available 24 hours/day Type of Home: House Home Access: Level entry     Home Layout: One level     Bathroom Shower/Tub: Producer, television/film/video: Handicapped height     Home  Equipment: Rollator (4 wheels);Hand held shower head;Grab bars - toilet;Shower seat - built in          Prior Functioning/Environment Prior Level of Function : Driving;Independent/Modified Independent             Mobility Comments: use of rollator as needed ADLs Comments: Pt is Ind in self care tasks and share IADLs tasks together    OT Problem List: Decreased strength;Decreased activity tolerance;Decreased safety awareness;Impaired balance (sitting and/or standing)   OT Treatment/Interventions: Self-care/ADL training;Therapeutic exercise;Therapeutic activities;Energy conservation;DME and/or AE instruction;Patient/family education;Balance training      OT Goals(Current goals can be found in the care plan section)   Acute Rehab OT Goals Patient Stated Goal: to go home OT Goal Formulation: With patient Time For Goal Achievement: 11/24/23 Potential to Achieve Goals: Fair ADL Goals Pt Will Perform Grooming: with modified independence;standing Pt Will Perform Lower Body Dressing: with modified independence;sit to/from stand Pt Will Transfer to Toilet: with modified independence;ambulating Pt Will Perform Toileting - Clothing Manipulation and hygiene: with modified independence;sit to/from stand   OT Frequency:  Min 1X/week       AM-PAC OT "6 Clicks" Daily Activity     Outcome Measure Help from another person eating meals?: None Help from another person taking care of personal grooming?: A Little Help from another person toileting, which includes using toliet, bedpan, or urinal?: A Little Help from another person bathing (including washing, rinsing, drying)?: A Little Help from another person to put on and taking off regular upper body clothing?: A Little Help from another person to put on and taking off regular lower body clothing?: A Little 6 Click Score: 19   End of Session Equipment Utilized During Treatment: Oxygen Nurse Communication: Mobility status  Activity  Tolerance: Patient tolerated treatment well Patient left: in bed;with call bell/phone within reach;with bed alarm set  OT Visit Diagnosis: Unsteadiness on feet (R26.81);Muscle weakness (generalized) (M62.81);Repeated falls (R29.6)                Time: 1610-9604 OT Time Calculation (min): 19 min Charges:  OT General Charges $OT Visit: 1 Visit OT Evaluation $OT Eval Low Complexity: 1 Low  Jackquline Denmark, MS, OTR/L , CBIS ascom [phone]  11/10/23, 12:46 PM

## 2023-11-10 NOTE — Progress Notes (Signed)
 1      PROGRESS NOTE    Catherine Green  FAO:[id number] DOB: 24-Oct-1940 DOA: 11/09/2023 PCP: Joaquim Nam, MD    Brief Narrative:   83 y.o. female with medical history significant of HFpEF, pulmonary hypertension, CAD s/p CABG (2009), atrial tachycardia, carotid artery disease, hypertension, hyperlipidemia, who presents to the ED due to shortness of breath   3/12: Cardiology consult, echo   Assessment & Plan:   Active Problems:   Acute on chronic heart failure with preserved ejection fraction (HFpEF) (HCC)   Acute hypoxic respiratory failure (HCC)   Essential hypertension   CAD, ARTERY BYPASS GRAFT   Acute on chronic heart failure with preserved ejection fraction (HFpEF) (HCC) Per chart review, patient has a history of HFpEF with last EF of 60-65% with grade 2 diastolic dysfunction, last evaluated in January 2023.  Will consult cardiology.    - echocardiogram repeated on this admission shows EF of 60 to 65%.  Cardiology consult - Increased Lasix to 40 mg IV twice daily per cardiology - Strict in and out - Daily weights   Acute hypoxic respiratory failure (HCC) In the setting of pulmonary edema and over COPD exacerbation -She is unable to take steroids/prednisone.  Will try Breo Ellipta inhaler as patient has been coughing and has been short of breath.  She does have a longstanding smoking history - Continue supplemental oxygen to maintain oxygen saturation above 88% - Wean as tolerated.  Currently on 2 L oxygen via nasal cannula   CAD, ARTERY BYPASS GRAFT No reported chest pain at this time.   - Continue home aspirin, statin, Zetia   Essential hypertension - Continue home amlodipine, metoprolol for now   DVT prophylaxis:  enoxaparin (LOVENOX) injection 40 mg Start: 11/09/23 2200     Code Status: Full code Family Communication: Updated husband at bedside Disposition Plan: Possible discharge in next 1 to 2 days depending on clinical condition   Consultants:   Cardiology    Subjective:  Cough, shortness of breath and hypoxia.  Currently on 2 L oxygen via nasal cannula.  She feels somewhat better but still gets dyspneic on minimal exertion.  She went to the bathroom at bedside and was very short of breath  Objective: Vitals:   11/10/23 1300 11/10/23 1430 11/10/23 1525 11/10/23 1528  BP:   (!) 133/45   Pulse: 70 63 61   Resp: 19 18 15    Temp:    98.2 F (36.8 C)  TempSrc:    Oral  SpO2: 95% 96% 95%     Intake/Output Summary (Last 24 hours) at 11/10/2023 1701 Last data filed at 11/09/2023 2315 Gross per 24 hour  Intake --  Output 950 ml  Net -950 ml   There were no vitals filed for this visit.  Examination:  General exam: Appears calm and comfortable  Respiratory system: Clear to auscultation. Respiratory effort normal. Cardiovascular system: S1 & S2 heard, RRR. No murmurs, trace pedal edema. Gastrointestinal system: Abdomen is soft, benign Central nervous system: Alert and oriented. No focal neurological deficits. Extremities: Symmetric 5 x 5 power. Skin: No rashes, lesions or ulcers Psychiatry: Judgement and insight appear normal. Mood & affect appropriate.     Data Reviewed: I have personally reviewed following labs and imaging studies  CBC: Recent Labs  Lab 11/09/23 1707  WBC 6.1  HGB 11.4*  HCT 36.8  MCV 85.2  PLT 256   Basic Metabolic Panel: Recent Labs  Lab 11/09/23 1707 11/10/23 0327  NA 138  136  K 4.4 3.9  CL 103 102  CO2 24 22  GLUCOSE 95 92  BUN 16 15  CREATININE 0.72 0.78  CALCIUM 9.0 8.3*  MG  --  2.2    Sepsis Labs: Recent Labs  Lab 11/09/23 2043  PROCALCITON <0.10    Recent Results (from the past 240 hours)  Resp panel by RT-PCR (RSV, Flu A&B, Covid) Anterior Nasal Swab     Status: None   Collection Time: 11/09/23  8:44 PM   Specimen: Anterior Nasal Swab  Result Value Ref Range Status   SARS Coronavirus 2 by RT PCR NEGATIVE NEGATIVE Final    Comment: (NOTE) SARS-CoV-2 target  nucleic acids are NOT DETECTED.  The SARS-CoV-2 RNA is generally detectable in upper respiratory specimens during the acute phase of infection. The lowest concentration of SARS-CoV-2 viral copies this assay can detect is 138 copies/mL. A negative result does not preclude SARS-Cov-2 infection and should not be used as the sole basis for treatment or other patient management decisions. A negative result may occur with  improper specimen collection/handling, submission of specimen other than nasopharyngeal swab, presence of viral mutation(s) within the areas targeted by this assay, and inadequate number of viral copies(<138 copies/mL). A negative result must be combined with clinical observations, patient history, and epidemiological information. The expected result is Negative.  Fact Sheet for Patients:  BloggerCourse.com  Fact Sheet for Healthcare Providers:  SeriousBroker.it  This test is no t yet approved or cleared by the [place] FDA and  has been authorized for detection and/or diagnosis of SARS-CoV-2 by FDA under an Emergency Use Authorization (EUA). This EUA will remain  in effect (meaning this test can be used) for the duration of the COVID-19 declaration under Section 564(b)(1) of the Act, 21 U.S.C.section 360bbb-3(b)(1), unless the authorization is terminated  or revoked sooner.       Influenza A by PCR NEGATIVE NEGATIVE Final   Influenza B by PCR NEGATIVE NEGATIVE Final    Comment: (NOTE) The Xpert Xpress SARS-CoV-2/FLU/RSV plus assay is intended as an aid in the diagnosis of influenza from Nasopharyngeal swab specimens and should not be used as a sole basis for treatment. Nasal washings and aspirates are unacceptable for Xpert Xpress SARS-CoV-2/FLU/RSV testing.  Fact Sheet for Patients: BloggerCourse.com  Fact Sheet for Healthcare  Providers: SeriousBroker.it  This test is not yet approved or cleared by the [place] FDA and has been authorized for detection and/or diagnosis of SARS-CoV-2 by FDA under an Emergency Use Authorization (EUA). This EUA will remain in effect (meaning this test can be used) for the duration of the COVID-19 declaration under Section 564(b)(1) of the Act, 21 U.S.C. section 360bbb-3(b)(1), unless the authorization is terminated or revoked.     Resp Syncytial Virus by PCR NEGATIVE NEGATIVE Final    Comment: (NOTE) Fact Sheet for Patients: BloggerCourse.com  Fact Sheet for Healthcare Providers: SeriousBroker.it  This test is not yet approved or cleared by the [place] FDA and has been authorized for detection and/or diagnosis of SARS-CoV-2 by FDA under an Emergency Use Authorization (EUA). This EUA will remain in effect (meaning this test can be used) for the duration of the COVID-19 declaration under Section 564(b)(1) of the Act, 21 U.S.C. section 360bbb-3(b)(1), unless the authorization is terminated or revoked.  Performed at [facility], [address], [place], [place] [address]          Radiology Studies: ECHOCARDIOGRAM COMPLETE Result Date: 11/10/2023    ECHOCARDIOGRAM REPORT  Patient Name:   SAMUEL RITTENHOUSE Date of Exam: 11/10/2023 Medical Rec #:  [id number]      Height:       62.0 in Accession #:    [id number]     Weight:       142.2 lb Date of Birth:  08/09/1941      BSA:          1.654 m Patient Age:    82 years       BP:           131/46 mmHg Patient Gender: F              HR:           70 bpm. Exam Location:  [facility] Procedure: 2D Echo, Cardiac Doppler and Color Doppler (Both Spectral and Color            Flow Doppler were utilized during procedure). Indications:     CHF-acute diastolic I50.31  History:         Patient has prior history of Echocardiogram examinations, most                   recent 09/18/2021.  Sonographer:     Cristela Blue Referring Phys:  [id number] Verdene Lennert Diagnosing Phys: Julien Nordmann MD IMPRESSIONS  1. Left ventricular ejection fraction, by estimation, is 60 to 65%. The left ventricle has normal function. The left ventricle has no regional wall motion abnormalities. Left ventricular diastolic parameters are indeterminate.  2. Right ventricular systolic function is normal. The right ventricular size is normal. There is normal pulmonary artery systolic pressure. The estimated right ventricular systolic pressure is 20.8 mmHg.  3. The mitral valve is normal in structure. Mild mitral valve regurgitation. No evidence of mitral stenosis.  4. The aortic valve is normal in structure. Aortic valve regurgitation is not visualized. Aortic valve sclerosis is present, with no evidence of aortic valve stenosis.  5. The inferior vena cava is normal in size with greater than 50% respiratory variability, suggesting right atrial pressure of 3 mmHg. FINDINGS  Left Ventricle: Left ventricular ejection fraction, by estimation, is 60 to 65%. The left ventricle has normal function. The left ventricle has no regional wall motion abnormalities. Strain was performed and the global longitudinal strain is indeterminate. The left ventricular internal cavity size was normal in size. There is no left ventricular hypertrophy. Left ventricular diastolic parameters are indeterminate. Right Ventricle: The right ventricular size is normal. No increase in right ventricular wall thickness. Right ventricular systolic function is normal. There is normal pulmonary artery systolic pressure. The tricuspid regurgitant velocity is 1.99 m/s, and  with an assumed right atrial pressure of 5 mmHg, the estimated right ventricular systolic pressure is 20.8 mmHg. Left Atrium: Left atrial size was normal in size. Right Atrium: Right atrial size was normal in size. Pericardium: There is no evidence of pericardial effusion.  Mitral Valve: The mitral valve is normal in structure. Mild mitral valve regurgitation. No evidence of mitral valve stenosis. MV peak gradient, 6.5 mmHg. The mean mitral valve gradient is 2.0 mmHg. Tricuspid Valve: The tricuspid valve is normal in structure. Tricuspid valve regurgitation is mild . No evidence of tricuspid stenosis. Aortic Valve: The aortic valve is normal in structure. Aortic valve regurgitation is not visualized. Aortic valve sclerosis is present, with no evidence of aortic valve stenosis. Aortic valve mean gradient measures 4.6 mmHg. Aortic valve peak gradient measures 8.3 mmHg. Aortic valve area, by VTI measures 0.94 cm.  Pulmonic Valve: The pulmonic valve was normal in structure. Pulmonic valve regurgitation is not visualized. No evidence of pulmonic stenosis. Aorta: The aortic root is normal in size and structure. Venous: The inferior vena cava is normal in size with greater than 50% respiratory variability, suggesting right atrial pressure of 3 mmHg. IAS/Shunts: No atrial level shunt detected by color flow Doppler. Additional Comments: 3D was performed not requiring image post processing on an independent workstation and was indeterminate.  LEFT VENTRICLE PLAX 2D LVIDd:         3.60 cm   Diastology LVIDs:         2.30 cm   LV e' medial:    5.77 cm/s LV PW:         1.20 cm   LV E/e' medial:  20.1 LV IVS:        1.00 cm   LV e' lateral:   7.62 cm/s LVOT diam:     1.37 cm   LV E/e' lateral: 15.2 LV SV:         30 LV SV Index:   18 LVOT Area:     1.47 cm  RIGHT VENTRICLE RV Basal diam:  2.50 cm RV Mid diam:    2.30 cm RV S prime:     8.16 cm/s TAPSE (M-mode): 1.6 cm LEFT ATRIUM             Index        RIGHT ATRIUM           Index LA diam:        3.10 cm 1.87 cm/m   RA Area:     11.10 cm LA Vol (A2C):   40.9 ml 24.73 ml/m  RA Volume:   24.30 ml  14.69 ml/m LA Vol (A4C):   35.9 ml 21.71 ml/m LA Biplane Vol: 40.6 ml 24.55 ml/m  AORTIC VALVE AV Area (Vmax):    0.82 cm AV Area (Vmean):   0.82  cm AV Area (VTI):     0.94 cm AV Vmax:           143.80 cm/s AV Vmean:          99.160 cm/s AV VTI:            0.323 m AV Peak Grad:      8.3 mmHg AV Mean Grad:      4.6 mmHg LVOT Vmax:         80.60 cm/s LVOT Vmean:        55.700 cm/s LVOT VTI:          0.207 m LVOT/AV VTI ratio: 0.64  AORTA Ao Root diam: 2.30 cm MITRAL VALVE                TRICUSPID VALVE MV Area (PHT): 3.15 cm     TR Peak grad:   15.8 mmHg MV Area VTI:   0.76 cm     TR Vmax:        199.00 cm/s MV Peak grad:  6.5 mmHg MV Mean grad:  2.0 mmHg     SHUNTS MV Vmax:       1.27 m/s     Systemic VTI:  0.21 m MV Vmean:      62.0 cm/s    Systemic Diam: 1.37 cm MV Decel Time: 241 msec MV E velocity: 116.00 cm/s MV A velocity: 62.40 cm/s MV E/A ratio:  1.86 Julien Nordmann MD Electronically signed by Julien Nordmann MD Signature Date/Time: 11/10/2023/4:02:06 PM  Final    US Venous Img Lower Unilateral Right Result Date: 11/09/2023 CLINICAL DATA:  Leg swelling. EXAM: RIGHT LOWER EXTREMITY VENOUS DOPPLER ULTRASOUND TECHNIQUE: Gray-scale sonography with compression, as well as color and duplex ultrasound, were performed to evaluate the deep venous system(s) from the level of the common femoral vein through the popliteal and proximal calf veins. COMPARISON:  None Available. FINDINGS: VENOUS Normal compressibility of the common femoral, superficial femoral, and popliteal veins, as well as the visualized calf veins. Visualized portions of profunda femoral vein and great saphenous vein unremarkable. No filling defects to suggest DVT on grayscale or color Doppler imaging. Doppler waveforms show normal direction of venous flow, normal respiratory plasticity and response to augmentation. Limited views of the contralateral common femoral vein are unremarkable. OTHER None. Limitations: none IMPRESSION: No evidence of right lower extremity DVT. Electronically Signed   By: Narda Rutherford M.D.   On: 11/09/2023 23:03   DG Chest 2 View Result Date:  11/09/2023 CLINICAL DATA:  SOB EXAM: CHEST - 2 VIEW COMPARISON:  Chest x-ray 01/22/2020 FINDINGS: The heart and mediastinal contours are unchanged. Atherosclerotic plaque. Surgical changes overlie the mediastinum. No focal consolidation. Chronic coarsened interstitial markings with superimposed bilateral perihilar interstitial and airspace opacities. Bilateral trace to small volume pleural effusions, left greater than right. No pneumothorax. No acute osseous abnormality. Sternotomy wires are intact. Cervical spine surgical hardware. IMPRESSION: 1. Chronic coarsened interstitial markings with superimposed bilateral perihilar interstitial and airspace opacities. Findings could represent pulmonary edema versus infection/inflammation. 2. Bilateral trace to small volume pleural effusions, left greater than right. 3.  Aortic Atherosclerosis (ICD10-I70.0). Electronically Signed   By: Tish Frederickson M.D.   On: 11/09/2023 19:37        Scheduled Meds:  amLODipine  1.25 mg Oral q AM   aspirin EC  81 mg Oral Daily   cyanocobalamin  1,000 mcg Oral Daily   enoxaparin (LOVENOX) injection  40 mg Subcutaneous Q24H   ezetimibe  10 mg Oral Daily   fluticasone furoate-vilanterol  1 puff Inhalation Daily   furosemide  40 mg Intravenous BID   gabapentin  400 mg Oral QID   metoprolol succinate  12.5 mg Oral QHS   rosuvastatin  20 mg Oral Daily   sertraline  150 mg Oral Daily   sodium chloride flush  3 mL Intravenous Q12H   Continuous Infusions:  sodium chloride       LOS: 1 day    Time spent: 35 minutes    Delfino Lovett, MD [facility] Pager [phone]  If 7PM-7AM, please contact night-coverage [email or web address]  11/10/2023, 5:01 PM

## 2023-11-10 NOTE — Assessment & Plan Note (Signed)
 Concern for heart failure exacerbation versus pneumonia.  Rationale for EMS discussed with patient.  EMS arrived and handoff discussed.  Patient was transported via ambulance.  I thank all involved.  She was continued on O2 without hypoxia in the meantime, after initial assessment.

## 2023-11-10 NOTE — ED Notes (Signed)
 Meds requested from pharmacy.

## 2023-11-10 NOTE — ED Notes (Signed)
 PT and OT at bedside at this time. Pt provided clean gown and sheets at this time.

## 2023-11-10 NOTE — Evaluation (Signed)
 Physical Therapy Evaluation Patient Details Name: Catherine Green MRN: [id number] DOB: 1941-08-09 Today's Date: 11/10/2023  History of Present Illness  Pt is an 83 yo female that presented to the ED for SOB. PMH of C5-6 C6-7 ACDF, x 2 other back surgeries, HFpEF, pulmonary HTN, CAD s/p CABG, atrial tachycardia, carotid artery disease, HTN, HLD, DVT.  Clinical Impression  Patient alert, agreeable to PT. Able to sit EOB with Pt, CGA for med administration from RN and ADLs (changing gown, doffing shirt/bra) minA. She was able to step pivot to [facility], CGA with handheld assist. Able to ambulate to bathroom ~71ft initially 2 handheld assist faded to 1. Per pt at baseline she is ambulatory with or without her rollator, husband available to assist and that he performs ADLs.  Overall the patient demonstrated deficits (see "PT Problem List") that impede the patient's functional abilities, safety, and mobility and would benefit from skilled PT intervention.          If plan is discharge home, recommend the following: A little help with walking and/or transfers;A little help with bathing/dressing/bathroom;Assistance with cooking/housework;Assist for transportation;Help with stairs or ramp for entrance   Can travel by private vehicle        Equipment Recommendations None recommended by PT  Recommendations for Other Services       Functional Status Assessment Patient has had a recent decline in their functional status and demonstrates the ability to make significant improvements in function in a reasonable and predictable amount of time.     Precautions / Restrictions Precautions Precautions: Fall Recall of Precautions/Restrictions: Intact Restrictions Weight Bearing Restrictions Per Provider Order: No      Mobility  Bed Mobility Overal bed mobility: Needs Assistance Bed Mobility: Supine to Sit     Supine to sit: Supervision          Transfers Overall transfer level: Needs  assistance Equipment used: 1 person hand held assist, 2 person hand held assist Transfers: Bed to chair/wheelchair/BSC, Sit to/from Stand Sit to Stand: Contact guard assist   Step pivot transfers: Contact guard assist       General transfer comment: steadying assist    Ambulation/Gait Ambulation/Gait assistance: Contact guard assist, Min assist Gait Distance (Feet): 15 Feet Assistive device: 1 person hand held assist, 2 person hand held assist         General Gait Details: 2 person to 1 person assist, handheld assist  Stairs            Wheelchair Mobility     Tilt Bed    Modified Rankin (Stroke Patients Only)       Balance Overall balance assessment: Needs assistance Sitting-balance support: Feet supported Sitting balance-Leahy Scale: Good     Standing balance support: Bilateral upper extremity supported, During functional activity Standing balance-Leahy Scale: Fair                               Pertinent Vitals/Pain Pain Assessment Pain Assessment: No/denies pain    Home Living Family/patient expects to be discharged to:: Private residence Living Arrangements: Spouse/significant other Available Help at Discharge: Family;Available 24 hours/day Type of Home: House Home Access: Level entry       Home Layout: One level Home Equipment: Rollator (4 wheels);Hand held shower head;Grab bars - toilet;Shower seat - built in      Prior Function               Mobility Comments:  use of rollator as needed       Extremity/Trunk Assessment   Upper Extremity Assessment Upper Extremity Assessment: Overall WFL for tasks assessed    Lower Extremity Assessment Lower Extremity Assessment: Generalized weakness       Communication        Cognition Arousal: Alert Behavior During Therapy: WFL for tasks assessed/performed   PT - Cognitive impairments: No apparent impairments                                 Cueing        General Comments      Exercises     Assessment/Plan    PT Assessment Patient needs continued PT services  PT Problem List Decreased strength;Decreased range of motion;Decreased activity tolerance;Decreased balance;Decreased mobility       PT Treatment Interventions DME instruction;Balance training;Gait training;Neuromuscular re-education;Stair training;Functional mobility training;Therapeutic activities;Patient/family education;Therapeutic exercise    PT Goals (Current goals can be found in the Care Plan section)  Acute Rehab PT Goals Patient Stated Goal: to go home PT Goal Formulation: With patient Time For Goal Achievement: 11/24/23 Potential to Achieve Goals: Good    Frequency Min 2X/week     Co-evaluation               AM-PAC PT "6 Clicks" Mobility  Outcome Measure Help needed turning from your back to your side while in a flat bed without using bedrails?: A Little Help needed moving from lying on your back to sitting on the side of a flat bed without using bedrails?: A Little Help needed moving to and from a bed to a chair (including a wheelchair)?: A Little Help needed standing up from a chair using your arms (e.g., wheelchair or bedside chair)?: A Little Help needed to walk in hospital room?: A Little Help needed climbing 3-5 steps with a railing? : A Little 6 Click Score: 18    End of Session Equipment Utilized During Treatment: Gait belt Activity Tolerance: Patient tolerated treatment well Patient left: Other (comment) (seated with OT in bathroom) Nurse Communication: Mobility status PT Visit Diagnosis: Other abnormalities of gait and mobility (R26.89);Difficulty in walking, not elsewhere classified (R26.2);Muscle weakness (generalized) (M62.81)    Time: 1308-6578 PT Time Calculation (min) (ACUTE ONLY): 19 min   Charges:   PT Evaluation $PT Eval Low Complexity: 1 Low PT Treatments $Therapeutic Activity: 8-22 mins PT General Charges $$ ACUTE PT  VISIT: 1 Visit         Olga Coaster PT, DPT 12:40 PM,11/10/23

## 2023-11-10 NOTE — Progress Notes (Incomplete)
 Heart Failure Stewardship Pharmacy Note  PCP: Joaquim Nam, MD PCP-Cardiologist: Julien Nordmann, MD  HPI: Catherine Green is a 83 y.o. female with HFpEF, pulmonary hypertension, CAD s/p CABG (2006), atrial tachycardia, carotid artery disease status post bilateral CEA, PAD, neuropathy, hypertension, hyperlipidemia who presented with shortness of breath. On admission, BNP was 204.2, HS-troponin was 8 > 8. Chest x-ray noted findings could represent pulmonary edema vs infection/inflammation.   Pertinent cardiac history: Underwent 3 vessel CABG in 2006. Cardiac cath from 09/2007 demonstrated an atretic LIMA to LAD with patent vein grafts. Echocardiogram 12/2014 showed LVEF of 60-65%, grade I diastolic function. Echocardiogram 11/2019 noted LVEF of 65-70% with grade III diastolic dysfunction. Stress test in 02/2020 not concerning for ischemia, butdid note PVCs on EKG. Echocardiogram noted LVEF 60-65%. LVEF unchanged in 08/2021. Negative stress test in 10/2021.   Pertinent Lab Values: Creat  Date Value Ref Range Status  08/04/2017 0.81 0.60 - 0.93 mg/dL Final    Comment:    For patients >80 years of age, the reference limit for Creatinine is approximately 13% higher for people identified as African-American. .    Creatinine, Ser  Date Value Ref Range Status  11/10/2023 0.78 0.44 - 1.00 mg/dL Final   BUN  Date Value Ref Range Status  11/10/2023 15 8 - 23 mg/dL Final  54/27/0623 13 8 - 27 mg/dL Final   Potassium  Date Value Ref Range Status  11/10/2023 3.9 3.5 - 5.1 mmol/L Final   Sodium  Date Value Ref Range Status  11/10/2023 136 135 - 145 mmol/L Final  01/21/2021 140 134 - 144 mmol/L Final   Brain Natriuretic Peptide  Date Value Ref Range Status  08/04/2017 172 (H) <100 pg/mL Final    Comment:    . BNP levels increase with age in the general population with the highest values seen in individuals greater than 76 years of age. Reference: J. Am. Ladon Applebaum. Cardiol. 2002;  76:283-151. .    B Natriuretic Peptide  Date Value Ref Range Status  11/09/2023 204.2 (H) 0.0 - 100.0 pg/mL Final    Comment:    Performed at [facility], [address] [place], [place] [address]   Magnesium  Date Value Ref Range Status  11/10/2023 2.2 1.7 - 2.4 mg/dL Final    Comment:    Performed at [facility], [address] [place], [place] [address]   Hemoglobin A1C  Date Value Ref Range Status  02/07/2021 5.8 (A) 4.0 - 5.6 % Final   Hgb A1c MFr Bld  Date Value Ref Range Status  02/01/2019 7.1 (H) 4.6 - 6.5 % Final    Comment:    Glycemic Control Guidelines for People with Diabetes:Non Diabetic:  <6%Goal of Therapy: <7%Additional Action Suggested:  >8%    TSH  Date Value Ref Range Status  09/20/2023 1.29 0.35 - 5.50 uIU/mL Final    Vital Signs: Temp:  [97.6 F (36.4 C)-98.6 F (37 C)] 97.6 F (36.4 C) (03/12 0401) Pulse Rate:  [62-79] 63 (03/12 0600) Cardiac Rhythm: Atrial fibrillation (03/12 0230) Resp:  [14-28] 16 (03/12 0600) BP: (108-177)/(49-95) 139/57 (03/12 0633) SpO2:  [84 %-100 %] 97 % (03/12 0600) Weight:  [64.5 kg (142 lb 3.2 oz)] 64.5 kg (142 lb 3.2 oz) (03/11 1500)  Intake/Output Summary (Last 24 hours) at 11/10/2023 0854 Last data filed at 11/09/2023 2315 Gross per 24 hour  Intake --  Output 950 ml  Net -950 ml    Current Heart Failure Medications:  Loop diuretic: furosemide 40 mg IV daily Beta-Blocker: metoprolol succinate 12.5 mg daily ACEI/ARB/ARNI: MRA: SGLT2i: Other: amlodipine 1.25 mg daily  Prior to admission Heart Failure Medications:  Loop diuretic: none Beta-Blocker: metoprolol succinate 12.5 mg daily ACEI/ARB/ARNI: none MRA: none SGLT2i: none Other: amlodipine 1.25 mg daily  Assessment: 1. Acute on chronic diastolic heart failure (LVEF 60-65%) ***, due to ***. NYHA class *** symptoms.   - Plan: 1) Medication changes recommended at this time:  2) Patient assistance:   3) Education: -To  be completed prior to discharge.  *** Medication Assistance / Insurance Benefits Check: Does the patient have prescription insurance?    Type of insurance plan:  Does the patient qualify for medication assistance through manufacturers or grants? {CHL AMB Yes/No/Pending:[id number]}  Eligible grants and/or patient assistance programs: ***  Medication assistance applications in progress: ***  Medication assistance applications approved: *** Approved medication assistance renewals will be completed by: ***  Outpatient Pharmacy: Prior to admission outpatient pharmacy: ***      ***

## 2023-11-11 DIAGNOSIS — J441 Chronic obstructive pulmonary disease with (acute) exacerbation: Secondary | ICD-10-CM | POA: Diagnosis not present

## 2023-11-11 DIAGNOSIS — I4891 Unspecified atrial fibrillation: Secondary | ICD-10-CM

## 2023-11-11 DIAGNOSIS — I509 Heart failure, unspecified: Secondary | ICD-10-CM | POA: Diagnosis not present

## 2023-11-11 DIAGNOSIS — I10 Essential (primary) hypertension: Secondary | ICD-10-CM | POA: Diagnosis not present

## 2023-11-11 LAB — CBC
HCT: 35.7 % — ABNORMAL LOW (ref 36.0–46.0)
Hemoglobin: 11.3 g/dL — ABNORMAL LOW (ref 12.0–15.0)
MCH: 26.2 pg (ref 26.0–34.0)
MCHC: 31.7 g/dL (ref 30.0–36.0)
MCV: 82.8 fL (ref 80.0–100.0)
Platelets: 251 10*3/uL (ref 150–400)
RBC: 4.31 MIL/uL (ref 3.87–5.11)
RDW: 14.7 % (ref 11.5–15.5)
WBC: 6.2 10*3/uL (ref 4.0–10.5)
nRBC: 0 % (ref 0.0–0.2)

## 2023-11-11 LAB — BASIC METABOLIC PANEL
Anion gap: 7 (ref 5–15)
BUN: 23 mg/dL (ref 8–23)
CO2: 32 mmol/L (ref 22–32)
Calcium: 8.9 mg/dL (ref 8.9–10.3)
Chloride: 98 mmol/L (ref 98–111)
Creatinine, Ser: 0.95 mg/dL (ref 0.44–1.00)
GFR, Estimated: 59 mL/min — ABNORMAL LOW (ref >60)
Glucose, Bld: 103 mg/dL — ABNORMAL HIGH (ref 70–99)
Potassium: 3.7 mmol/L (ref 3.5–5.1)
Sodium: 137 mmol/L (ref 135–145)

## 2023-11-11 MED ORDER — POTASSIUM CHLORIDE CRYS ER 20 MEQ PO TBCR
20.0000 meq | EXTENDED_RELEASE_TABLET | Freq: Once | ORAL | Status: AC
  Administered 2023-11-11: 20 meq via ORAL
  Filled 2023-11-11: qty 1

## 2023-11-11 NOTE — Consult Note (Signed)
 PHARMACY CONSULT NOTE - ELECTROLYTES  Pharmacy Consult for Electrolyte Monitoring and Replacement   Recent Labs:   Estimated Creatinine Clearance: 39.6 mL/min (by C-G formula based on SCr of 0.95 mg/dL). Potassium (mmol/L)  Date Value  11/11/2023 3.7   Magnesium (mg/dL)  Date Value  16/06/9603 2.2   Calcium (mg/dL)  Date Value  54/05/8118 8.9   Albumin (g/dL)  Date Value  14/78/2956 4.7  06/29/2016 4.6   Phosphorus (mg/dL)  Date Value  21/30/8657 3.3   Sodium (mmol/L)  Date Value  11/11/2023 137  01/21/2021 140   Corrected Ca: 8.9 mg/dL  Assessment  Catherine Green is a 83 y.o. female presenting with acute on chronic . PMH significant for HFpEF, pulm HTN, CAD s/p CABG, CAD, HTN, HLD, and Anxiety. Pharmacy has been consulted to monitor and replace electrolytes.  Diet: Heart healthy diet MIVF: N/A Pertinent medications: Furosemide 40 mg IV BID  Goal of Therapy: Electrolytes WNL  Plan:  K = 3.7, give Kcl 20 mEq po x 1 Check BMP, Mg, Phos with AM labs  Thank you for allowing pharmacy to be a part of this patient's care.  Barrie Folk, PharmD Clinical Pharmacist 11/11/2023 2:48 PM

## 2023-11-11 NOTE — Progress Notes (Signed)
 Mobility Specialist - Progress Note   Pre-mobility: SpO2(94) on RA  During mobility: SpO2(80); pushed to 2L (98) Post-mobility: SPO2(94) on 2L     11/11/23 1600  Mobility  Activity Stood at bedside;Ambulated with assistance in room;Dangled on edge of bed  Level of Assistance Contact guard assist, steadying assist  Assistive Device Front wheel walker  Distance Ambulated (ft) 10 ft  Range of Motion/Exercises Active  Activity Response Tolerated well  Mobility Referral Yes  Mobility visit 1 Mobility  Mobility Specialist Start Time (ACUTE ONLY) 1541  Mobility Specialist Stop Time (ACUTE ONLY) 1605  Mobility Specialist Time Calculation (min) (ACUTE ONLY) 24 min   Pt resting in bed on RA upon entry. Pt STS MinA and ambulates toward door close CGA with RW. Pt SpO2 desat to 80% and pt pushed to 2L to recover O2. Pt endorsed feeling funny and Pt legs gave out during desat and pt carried back to EOB. Pt sat EOB for 30-45 seconds on 2L to recover O2 and regain bearings. Pt utilized bed end railing to hold herself up. Pt STS and side stepped to head of bed x1, returned to bed and left with needs in reach. Pt left on 2L to regain O2. RN present at bedside.   Johnathan Hausen Mobility Specialist 11/11/23, 4:26 PM

## 2023-11-11 NOTE — Progress Notes (Signed)
 1      PROGRESS NOTE    Catherine Green  JXB:[id number] DOB: 09/30/1940 DOA: 11/09/2023 PCP: Joaquim Nam, MD    Brief Narrative:   83 y.o. female with medical history significant of HFpEF, pulmonary hypertension, CAD s/p CABG (2009), atrial tachycardia, carotid artery disease, hypertension, hyperlipidemia, who presents to the ED due to shortness of breath   3/12: Cardiology consult, echo 3/13: Ambulation, wean o2   Assessment & Plan:   Active Problems:   Acute on chronic congestive heart failure (HCC)   Acute hypoxic respiratory failure (HCC)   Essential hypertension   CAD, ARTERY BYPASS GRAFT   Chronic obstructive pulmonary disease with acute exacerbation (HCC)   Atrial fibrillation (HCC)   Acute on chronic heart failure with preserved ejection fraction (HFpEF) (HCC) Per chart review, patient has a history of HFpEF with last EF of 60-65% with grade 2 diastolic dysfunction, last evaluated in January 2023.    - echocardiogram repeated on this admission shows EF of 60 to 65%.  Cardiology following - Continue Lasix to 40 mg IV twice daily for now per cardiology - Strict in and out Net IO Since Admission: -1,850 mL [11/11/23 1817]  - Daily weights   Acute hypoxic respiratory failure (HCC) In the setting of pulmonary edema and over COPD exacerbation -She is unable to take steroids/prednisone.  Will try Breo Ellipta inhaler as patient has been coughing and has been short of breath.  She does have a longstanding smoking history - Continue supplemental oxygen to maintain oxygen saturation above 88% - Wean as tolerated.  Currently on 2 L oxygen via nasal cannula. Per nursing note - 80% on RA ambulating 15 ft to the bathroom assisted    CAD, ARTERY BYPASS GRAFT No reported chest pain at this time.   - Continue home aspirin, statin, Zetia   Essential hypertension - Continue home amlodipine, metoprolol for now   DVT prophylaxis:  enoxaparin (LOVENOX) injection 40 mg Start:  11/09/23 2200     Code Status: Full code Family Communication: Updated husband at bedside Disposition Plan: Possible discharge in next 1 to 2 days depending on clinical condition   Consultants:  Cardiology    Subjective:  Cough, shortness of breath and hypoxia +.  remains on 2 L oxygen via nasal cannula.  Husband at bedside.  She went to the bathroom earlier and got hypoxic on minimal exertion  Objective: Vitals:   11/11/23 1406 11/11/23 1530 11/11/23 1715 11/11/23 1715  BP:  (!) 117/40 (!) 128/107   Pulse:  66 73   Resp:   (!) 21   Temp:    98.2 F (36.8 C)  TempSrc:    Oral  SpO2: 96% 98% 96%     Intake/Output Summary (Last 24 hours) at 11/11/2023 1815 Last data filed at 11/11/2023 1211 Gross per 24 hour  Intake --  Output 900 ml  Net -900 ml   There were no vitals filed for this visit.  Examination:  General exam: Appears calm and comfortable  Respiratory system: Clear to auscultation. Respiratory effort normal. Cardiovascular system: S1 & S2 heard, RRR. No murmurs, trace pedal edema. Gastrointestinal system: Abdomen is soft, benign Central nervous system: Alert and oriented. No focal neurological deficits. Extremities: Symmetric 5 x 5 power. Skin: No rashes, lesions or ulcers Psychiatry: Judgement and insight appear normal. Mood & affect appropriate.     Data Reviewed: I have personally reviewed following labs and imaging studies  CBC: Recent Labs  Lab 11/09/23 1707  11/11/23 0617  WBC 6.1 6.2  HGB 11.4* 11.3*  HCT 36.8 35.7*  MCV 85.2 82.8  PLT 256 251   Basic Metabolic Panel: Recent Labs  Lab 11/09/23 1707 11/10/23 0327 11/11/23 0617  NA 138 136 137  K 4.4 3.9 3.7  CL 103 102 98  CO2 24 22 32  GLUCOSE 95 92 103*  BUN 16 15 23   CREATININE 0.72 0.78 0.95  CALCIUM 9.0 8.3* 8.9  MG  --  2.2  --     Sepsis Labs: Recent Labs  Lab 11/09/23 2043  PROCALCITON <0.10    Recent Results (from the past 240 hours)  Resp panel by RT-PCR  (RSV, Flu A&B, Covid) Anterior Nasal Swab     Status: None   Collection Time: 11/09/23  8:44 PM   Specimen: Anterior Nasal Swab  Result Value Ref Range Status   SARS Coronavirus 2 by RT PCR NEGATIVE NEGATIVE Final    Comment: (NOTE) SARS-CoV-2 target nucleic acids are NOT DETECTED.  The SARS-CoV-2 RNA is generally detectable in upper respiratory specimens during the acute phase of infection. The lowest concentration of SARS-CoV-2 viral copies this assay can detect is 138 copies/mL. A negative result does not preclude SARS-Cov-2 infection and should not be used as the sole basis for treatment or other patient management decisions. A negative result may occur with  improper specimen collection/handling, submission of specimen other than nasopharyngeal swab, presence of viral mutation(s) within the areas targeted by this assay, and inadequate number of viral copies(<138 copies/mL). A negative result must be combined with clinical observations, patient history, and epidemiological information. The expected result is Negative.  Fact Sheet for Patients:  BloggerCourse.com  Fact Sheet for Healthcare Providers:  SeriousBroker.it  This test is no t yet approved or cleared by the [place] FDA and  has been authorized for detection and/or diagnosis of SARS-CoV-2 by FDA under an Emergency Use Authorization (EUA). This EUA will remain  in effect (meaning this test can be used) for the duration of the COVID-19 declaration under Section 564(b)(1) of the Act, 21 U.S.C.section 360bbb-3(b)(1), unless the authorization is terminated  or revoked sooner.       Influenza A by PCR NEGATIVE NEGATIVE Final   Influenza B by PCR NEGATIVE NEGATIVE Final    Comment: (NOTE) The Xpert Xpress SARS-CoV-2/FLU/RSV plus assay is intended as an aid in the diagnosis of influenza from Nasopharyngeal swab specimens and should not be used as a sole basis for  treatment. Nasal washings and aspirates are unacceptable for Xpert Xpress SARS-CoV-2/FLU/RSV testing.  Fact Sheet for Patients: BloggerCourse.com  Fact Sheet for Healthcare Providers: SeriousBroker.it  This test is not yet approved or cleared by the [place] FDA and has been authorized for detection and/or diagnosis of SARS-CoV-2 by FDA under an Emergency Use Authorization (EUA). This EUA will remain in effect (meaning this test can be used) for the duration of the COVID-19 declaration under Section 564(b)(1) of the Act, 21 U.S.C. section 360bbb-3(b)(1), unless the authorization is terminated or revoked.     Resp Syncytial Virus by PCR NEGATIVE NEGATIVE Final    Comment: (NOTE) Fact Sheet for Patients: BloggerCourse.com  Fact Sheet for Healthcare Providers: SeriousBroker.it  This test is not yet approved or cleared by the [place] FDA and has been authorized for detection and/or diagnosis of SARS-CoV-2 by FDA under an Emergency Use Authorization (EUA). This EUA will remain in effect (meaning this test can be used) for the duration of the COVID-19 declaration under  Section 564(b)(1) of the Act, 21 U.S.C. section 360bbb-3(b)(1), unless the authorization is terminated or revoked.  Performed at [facility], [address] [place], [place] [address]          Radiology Studies: ECHOCARDIOGRAM COMPLETE Result Date: 11/10/2023    ECHOCARDIOGRAM REPORT   Patient Name:   Catherine Green Date of Exam: 11/10/2023 Medical Rec #:  [id number]      Height:       62.0 in Accession #:    [id number]     Weight:       142.2 lb Date of Birth:  Nov 23, 1940      BSA:          1.654 m Patient Age:    82 years       BP:           131/46 mmHg Patient Gender: F              HR:           70 bpm. Exam Location:  [facility] Procedure: 2D Echo, Cardiac Doppler and Color Doppler (Both Spectral  and Color            Flow Doppler were utilized during procedure). Indications:     CHF-acute diastolic I50.31  History:         Patient has prior history of Echocardiogram examinations, most                  recent 09/18/2021.  Sonographer:     Cristela Blue Referring Phys:  [id number] Verdene Lennert Diagnosing Phys: Julien Nordmann MD IMPRESSIONS  1. Left ventricular ejection fraction, by estimation, is 60 to 65%. The left ventricle has normal function. The left ventricle has no regional wall motion abnormalities. Left ventricular diastolic parameters are indeterminate.  2. Right ventricular systolic function is normal. The right ventricular size is normal. There is normal pulmonary artery systolic pressure. The estimated right ventricular systolic pressure is 20.8 mmHg.  3. The mitral valve is normal in structure. Mild mitral valve regurgitation. No evidence of mitral stenosis.  4. The aortic valve is normal in structure. Aortic valve regurgitation is not visualized. Aortic valve sclerosis is present, with no evidence of aortic valve stenosis.  5. The inferior vena cava is normal in size with greater than 50% respiratory variability, suggesting right atrial pressure of 3 mmHg. FINDINGS  Left Ventricle: Left ventricular ejection fraction, by estimation, is 60 to 65%. The left ventricle has normal function. The left ventricle has no regional wall motion abnormalities. Strain was performed and the global longitudinal strain is indeterminate. The left ventricular internal cavity size was normal in size. There is no left ventricular hypertrophy. Left ventricular diastolic parameters are indeterminate. Right Ventricle: The right ventricular size is normal. No increase in right ventricular wall thickness. Right ventricular systolic function is normal. There is normal pulmonary artery systolic pressure. The tricuspid regurgitant velocity is 1.99 m/s, and  with an assumed right atrial pressure of 5 mmHg, the estimated right  ventricular systolic pressure is 20.8 mmHg. Left Atrium: Left atrial size was normal in size. Right Atrium: Right atrial size was normal in size. Pericardium: There is no evidence of pericardial effusion. Mitral Valve: The mitral valve is normal in structure. Mild mitral valve regurgitation. No evidence of mitral valve stenosis. MV peak gradient, 6.5 mmHg. The mean mitral valve gradient is 2.0 mmHg. Tricuspid Valve: The tricuspid valve is normal in structure. Tricuspid valve regurgitation is mild . No evidence  of tricuspid stenosis. Aortic Valve: The aortic valve is normal in structure. Aortic valve regurgitation is not visualized. Aortic valve sclerosis is present, with no evidence of aortic valve stenosis. Aortic valve mean gradient measures 4.6 mmHg. Aortic valve peak gradient measures 8.3 mmHg. Aortic valve area, by VTI measures 0.94 cm. Pulmonic Valve: The pulmonic valve was normal in structure. Pulmonic valve regurgitation is not visualized. No evidence of pulmonic stenosis. Aorta: The aortic root is normal in size and structure. Venous: The inferior vena cava is normal in size with greater than 50% respiratory variability, suggesting right atrial pressure of 3 mmHg. IAS/Shunts: No atrial level shunt detected by color flow Doppler. Additional Comments: 3D was performed not requiring image post processing on an independent workstation and was indeterminate.  LEFT VENTRICLE PLAX 2D LVIDd:         3.60 cm   Diastology LVIDs:         2.30 cm   LV e' medial:    5.77 cm/s LV PW:         1.20 cm   LV E/e' medial:  20.1 LV IVS:        1.00 cm   LV e' lateral:   7.62 cm/s LVOT diam:     1.37 cm   LV E/e' lateral: 15.2 LV SV:         30 LV SV Index:   18 LVOT Area:     1.47 cm  RIGHT VENTRICLE RV Basal diam:  2.50 cm RV Mid diam:    2.30 cm RV S prime:     8.16 cm/s TAPSE (M-mode): 1.6 cm LEFT ATRIUM             Index        RIGHT ATRIUM           Index LA diam:        3.10 cm 1.87 cm/m   RA Area:     11.10 cm LA  Vol (A2C):   40.9 ml 24.73 ml/m  RA Volume:   24.30 ml  14.69 ml/m LA Vol (A4C):   35.9 ml 21.71 ml/m LA Biplane Vol: 40.6 ml 24.55 ml/m  AORTIC VALVE AV Area (Vmax):    0.82 cm AV Area (Vmean):   0.82 cm AV Area (VTI):     0.94 cm AV Vmax:           143.80 cm/s AV Vmean:          99.160 cm/s AV VTI:            0.323 m AV Peak Grad:      8.3 mmHg AV Mean Grad:      4.6 mmHg LVOT Vmax:         80.60 cm/s LVOT Vmean:        55.700 cm/s LVOT VTI:          0.207 m LVOT/AV VTI ratio: 0.64  AORTA Ao Root diam: 2.30 cm MITRAL VALVE                TRICUSPID VALVE MV Area (PHT): 3.15 cm     TR Peak grad:   15.8 mmHg MV Area VTI:   0.76 cm     TR Vmax:        199.00 cm/s MV Peak grad:  6.5 mmHg MV Mean grad:  2.0 mmHg     SHUNTS MV Vmax:       1.27 m/s     Systemic VTI:  0.21 m MV Vmean:      62.0 cm/s    Systemic Diam: 1.37 cm MV Decel Time: 241 msec MV E velocity: 116.00 cm/s MV A velocity: 62.40 cm/s MV E/A ratio:  1.86 Julien Nordmann MD Electronically signed by Julien Nordmann MD Signature Date/Time: 11/10/2023/4:02:06 PM    Final    US Venous Img Lower Unilateral Right Result Date: 11/09/2023 CLINICAL DATA:  Leg swelling. EXAM: RIGHT LOWER EXTREMITY VENOUS DOPPLER ULTRASOUND TECHNIQUE: Gray-scale sonography with compression, as well as color and duplex ultrasound, were performed to evaluate the deep venous system(s) from the level of the common femoral vein through the popliteal and proximal calf veins. COMPARISON:  None Available. FINDINGS: VENOUS Normal compressibility of the common femoral, superficial femoral, and popliteal veins, as well as the visualized calf veins. Visualized portions of profunda femoral vein and great saphenous vein unremarkable. No filling defects to suggest DVT on grayscale or color Doppler imaging. Doppler waveforms show normal direction of venous flow, normal respiratory plasticity and response to augmentation. Limited views of the contralateral common femoral vein are unremarkable.  OTHER None. Limitations: none IMPRESSION: No evidence of right lower extremity DVT. Electronically Signed   By: Narda Rutherford M.D.   On: 11/09/2023 23:03        Scheduled Meds:  amLODipine  1.25 mg Oral q AM   aspirin EC  81 mg Oral Daily   cyanocobalamin  1,000 mcg Oral Daily   enoxaparin (LOVENOX) injection  40 mg Subcutaneous Q24H   ezetimibe  10 mg Oral Daily   fluticasone furoate-vilanterol  1 puff Inhalation Daily   furosemide  40 mg Intravenous BID   gabapentin  400 mg Oral QID   metoprolol succinate  12.5 mg Oral QHS   rosuvastatin  20 mg Oral Daily   sertraline  150 mg Oral Daily   sodium chloride flush  3 mL Intravenous Q12H   Continuous Infusions:     LOS: 2 days    Time spent: 35 minutes    Delfino Lovett, MD [facility] Pager [phone]  If 7PM-7AM, please contact night-coverage [email or web address]  11/11/2023, 6:15 PM

## 2023-11-11 NOTE — Progress Notes (Signed)
 Rounding Note    Patient Name: Catherine Green Date of Encounter: 11/11/2023  [facility] Cardiologist: Julien Nordmann, MD   Subjective   Patient reports improvements in breathing although nursing note states patient desaturated to 80% upon ambulation to the restroom this morning off nasal cannula. No I/Os recorded with ongoing diuresis.   Inpatient Medications    Scheduled Meds:  amLODipine  1.25 mg Oral q AM   aspirin EC  81 mg Oral Daily   cyanocobalamin  1,000 mcg Oral Daily   enoxaparin (LOVENOX) injection  40 mg Subcutaneous Q24H   ezetimibe  10 mg Oral Daily   fluticasone furoate-vilanterol  1 puff Inhalation Daily   furosemide  40 mg Intravenous BID   gabapentin  400 mg Oral QID   metoprolol succinate  12.5 mg Oral QHS   rosuvastatin  20 mg Oral Daily   sertraline  150 mg Oral Daily   sodium chloride flush  3 mL Intravenous Q12H   Continuous Infusions:  PRN Meds: acetaminophen, albuterol, ondansetron (ZOFRAN) IV, sodium chloride flush   Vital Signs    Vitals:   11/11/23 0418 11/11/23 0730 11/11/23 0826 11/11/23 0827  BP: (!) 141/46 (!) 135/52  (!) 168/58  Pulse: 62 62    Resp: 18 13    Temp: 98 F (36.7 C)  97.8 F (36.6 C)   TempSrc: Oral  Oral   SpO2: 98% 96%     No intake or output data in the 24 hours ending 11/11/23 0913    11/09/2023    3:00 PM 11/05/2023   12:27 PM 09/20/2023   12:26 PM  Last 3 Weights  Weight (lbs) 142 lb 3.2 oz 140 lb 138 lb  Weight (kg) 64.501 kg 63.504 kg 62.596 kg      Telemetry    Sinus rhythm - Personally Reviewed  Physical Exam   GEN: No acute distress.   Neck: No JVD Cardiac: RRR, no murmurs, rubs, or gallops.  Respiratory: Clear to auscultation bilaterally. GI: Soft, nontender, non-distended  MS: Trace LE edema; No deformity. Neuro:  Nonfocal  Psych: Normal affect   Labs    High Sensitivity Troponin:   Recent Labs  Lab 11/09/23 1707 11/09/23 2044  TROPONINIHS 8 8     Chemistry Recent  Labs  Lab 11/09/23 1707 11/10/23 0327 11/11/23 0617  NA 138 136 137  K 4.4 3.9 3.7  CL 103 102 98  CO2 24 22 32  GLUCOSE 95 92 103*  BUN 16 15 23   CREATININE 0.72 0.78 0.95  CALCIUM 9.0 8.3* 8.9  MG  --  2.2  --   GFRNONAA >60 >60 59*  ANIONGAP 11 12 7     Lipids No results for input(s): "CHOL", "TRIG", "HDL", "LABVLDL", "LDLCALC", "CHOLHDL" in the last 168 hours.  Hematology Recent Labs  Lab 11/09/23 1707 11/11/23 0617  WBC 6.1 6.2  RBC 4.32 4.31  HGB 11.4* 11.3*  HCT 36.8 35.7*  MCV 85.2 82.8  MCH 26.4 26.2  MCHC 31.0 31.7  RDW 14.8 14.7  PLT 256 251   Thyroid No results for input(s): "TSH", "FREET4" in the last 168 hours.  BNP Recent Labs  Lab 11/09/23 1707  BNP 204.2*    DDimer No results for input(s): "DDIMER" in the last 168 hours.   Radiology   US Venous Img Lower Unilateral Right Result Date: 11/09/2023 IMPRESSION: No evidence of right lower extremity DVT. Electronically Signed   By: Narda Rutherford M.D.   On: 11/09/2023  23:03   DG Chest 2 View Result Date: 11/09/2023 IMPRESSION: 1. Chronic coarsened interstitial markings with superimposed bilateral perihilar interstitial and airspace opacities. Findings could represent pulmonary edema versus infection/inflammation. 2. Bilateral trace to small volume pleural effusions, left greater than right. 3.  Aortic Atherosclerosis (ICD10-I70.0). Electronically Signed   By: Tish Frederickson M.D.   On: 11/09/2023 19:37   Cardiac Studies   11/10/2023 Echo complete 1. Left ventricular ejection fraction, by estimation, is 60 to 65%. The  left ventricle has normal function. The left ventricle has no regional  wall motion abnormalities. Left ventricular diastolic parameters are  indeterminate.   2. Right ventricular systolic function is normal. The right ventricular  size is normal. There is normal pulmonary artery systolic pressure. The  estimated right ventricular systolic pressure is 20.8 mmHg.   3. The mitral valve  is normal in structure. Mild mitral valve  regurgitation. No evidence of mitral stenosis.   4. The aortic valve is normal in structure. Aortic valve regurgitation is  not visualized. Aortic valve sclerosis is present, with no evidence of  aortic valve stenosis.   5. The inferior vena cava is normal in size with greater than 50%  respiratory variability, suggesting right atrial pressure of 3 mmHg  Patient Profile     Catherine Green is a 83 y.o. female with a hx of CAD s/p three vessel CABG 2006, HFpEF, bilateral carotid artery disease s/p bilateral CEA followed by vascular surgery, HTN, HLD, frequent PVCs, atrial tachycardia, multiple mediation intolerances, back pain s/p surgery, and prior tobacco use from age [age] who is being seen for acute on chronic HFpEF.   Assessment & Plan    Acute on chronic HFpEF - Echo 09/15/2021 showed EF of 60 to 65%, no RWMA, G2DD, normal RV, mildly elevated PASP, moderate MR - Presented 3/11 from PCP office with hypoxia and cough x2 days - BNP 204 - Repeat echo shows LVEF 60-65% - No I/Os or weights recorded in the ED  - Appears slightly volume up on exam - Continue IV Lasix 40 mg BID - Continue to monitor kidney function, strict I/Os, and daily weights - Continue PTA metoprolol succinate 12.5 mg - GDMT limited by medication intolerance  Acute hypoxic respiratory failure - Currently requiring 2 L supplemental O2 - In the setting of pulmonary edema, suspect a component of COPD with 30 year smoking history - Nursing note states patient desaturated to 80% while ambulating to the restroom on room air - Received Brio Ellipta inhaler yesterday and today with some improvement in SOB - Encouraged ambulation as able with close monitoring of sats  CAD - Denies chest pain - Troponin negative - No further ischemic workup indicated at this time  Hypertension - BP stable, continue PTA amlodipine and metoprolol  For questions or updates, please contact [facility] Please consult [email or web address] for contact info under        Signed, Orion Crook, PA-C  11/11/2023, 9:13 AM

## 2023-11-11 NOTE — TOC Initial Note (Signed)
 Transition of Care [facility]) - Initial/Assessment Note    Patient Details  Name: Catherine Green MRN: [id number] Date of Birth: [birth year]  Transition of Care [facility]) CM/SW Contact:    Colin Broach, LCSW Phone Number: 11/11/2023, 12:47 PM  Clinical Narrative:                 CSW met with pt to complete initial assessment.  CSW introduced self and reason for visit.  Pt's husband, Fayrene Fearing [phone]) was present during evaluation.  Pt demographic and insurance information verified.  Pt reports that her PCP is Crawford Givens and she uses [facility] for her pharmacy needs.  She doesn't express any concerns with paying for meds.  Pt lives with her husband.  She has the following  DME:  walker and shower chair.  At d/c, her husband will transport her home.  Pt Will receive HH services from [facility] (PT/OT/Aide).  She will also d/c with 2L [place]. Pt ok to d/c once O2 set up.    Expected Discharge Plan: Home w Home Health Services Barriers to Discharge: Continued Medical Work up   Patient Goals and CMS Choice            Expected Discharge Plan and Services       Living arrangements for the past 2 months: Single Family Home                 DME Arranged: Oxygen DME Agency: [facility] [place]) Date DME Agency Contacted: 11/11/23   Representative spoke with at DME Agency: Marthann Schiller HH Arranged: OT, PT, Nurse's Aide HH Agency: [facility] Date [facility] Agency Contacted: 11/11/23   Representative spoke with at [facility] Agency: [place]  Prior Living Arrangements/Services Living arrangements for the past 2 months: Single Family Home Lives with:: Spouse Catherine Green, [phone]) Patient language and need for interpreter reviewed:: Yes Do you feel safe going back to the place where you live?: Yes        Care giver support system in place?: Yes (comment)   Criminal Activity/Legal Involvement Pertinent to Current Situation/Hospitalization: No - Comment as needed  Activities of Daily  Living      Permission Sought/Granted                  Emotional Assessment Appearance:: Appears stated age Attitude/Demeanor/Rapport: Engaged Affect (typically observed): Appropriate Orientation: : Oriented to Self, Oriented to Place, Oriented to  Time, Oriented to Situation Alcohol / Substance Use: Not Applicable Psych Involvement: No (comment)  Admission diagnosis:  Acute on chronic heart failure with preserved ejection fraction (HFpEF) (HCC) [I50.33] Patient Active Problem List   Diagnosis Date Noted   Chronic obstructive pulmonary disease with acute exacerbation (HCC) 11/10/2023   Acute on chronic congestive heart failure (HCC) 11/09/2023   Acute hypoxic respiratory failure (HCC) 11/09/2023   Hair loss 09/22/2023   Cervical spondylosis with myelopathy and radiculopathy 01/11/2023   Acute non-recurrent maxillary sinusitis 10/28/2022   Muscle weakness (generalized) 07/17/2022   Mucous cyst of finger 05/17/2022   Acute recurrent frontal sinusitis 04/21/2021   Impacted cerumen of right ear 04/21/2021   History of diabetes mellitus 02/09/2021   Scalp irritation 11/13/2020   Seasonal allergies 07/12/2019   Anemia 02/05/2019   Rash 09/04/2018   GERD (gastroesophageal reflux disease) 09/04/2018   Elevated CK 07/30/2018   Neuropathy 04/24/2018   Health care maintenance 01/19/2018   Medicare annual wellness visit, initial 01/19/2018   Grief 10/07/2017   Sweating abnormality 05/28/2017  Insomnia 05/28/2017   Fatigue 01/15/2017   Dysuria 05/06/2016   Skin lesion 03/16/2016   Aftercare following surgery of the circulatory system 02/18/2016   Spinal stenosis of lumbar region 08/21/2015   Labile blood pressure 05/17/2015   Leg pain, bilateral 02/08/2015   SOB (shortness of breath) 02/08/2015   Cough 01/17/2015   Low back pain 11/05/2014   Benign paroxysmal positional vertigo 09/04/2014   Carotid stenosis 07/03/2014   Preoperative cardiovascular examination 03/07/2014    Esophageal dysphagia 12/25/2013   Acute on chronic diastolic CHF (congestive heart failure) (HCC) 11/23/2013   Palpitations 11/23/2013   Urticaria 04/05/2013   Screening for colon cancer 05/27/2012   Hyperlipidemia 05/04/2012   Dizziness 04/11/2012   Anxiety and depression 03/10/2012   Carotid artery disease (HCC) 12/22/2011   Other and unspecified hyperlipidemia 10/08/2009   Essential hypertension 10/08/2009   CAD, ARTERY BYPASS GRAFT 10/08/2009   PVD 10/08/2009   SYNCOPE, HX OF 10/08/2009   THROMBOPHLEBITIS, HX OF 10/08/2009   PCP:  Joaquim Nam, MD Pharmacy:   [facility] - [place], [place] - [address] [place] [address] Phone: [phone] Fax: [phone]     Social Drivers of Health (SDOH) Social History: SDOH Screenings   Food Insecurity: No Food Insecurity (08/18/2023)  Housing: Unknown (08/18/2023)  Transportation Needs: No Transportation Needs (08/18/2023)  Utilities: Not At Risk (08/18/2023)  Alcohol Screen: Low Risk  (05/25/2023)  Depression (PHQ2-9): Low Risk  (05/25/2023)  Financial Resource Strain: Low Risk  (05/25/2023)  Physical Activity: Sufficiently Active (05/25/2023)  Social Connections: Moderately Integrated (05/25/2023)  Stress: No Stress Concern Present (05/25/2023)  Tobacco Use: Medium Risk (11/09/2023)  Health Literacy: Adequate Health Literacy (05/25/2023)   SDOH Interventions:     Readmission Risk Interventions     No data to display

## 2023-11-11 NOTE — Progress Notes (Signed)
 Heart Failure Stewardship Pharmacy Note  PCP: Joaquim Nam, MD PCP-Cardiologist: Julien Nordmann, MD  HPI: Catherine Green is a 83 y.o. female with HFpEF, pulmonary hypertension, CAD s/p CABG (2006), atrial tachycardia, carotid artery disease status post bilateral CEA, PAD, neuropathy, hypertension, hyperlipidemia who presented with shortness of breath. On admission, BNP was 204.2, HS-troponin was 8 > 8. Chest x-ray noted findings could represent pulmonary edema vs infection/inflammation.   Pertinent cardiac history: Underwent 3 vessel CABG in 2006. Cardiac cath from 09/2007 demonstrated an atretic LIMA to LAD with patent vein grafts. Echocardiogram 12/2014 showed LVEF of 60-65%, grade I diastolic function. Echocardiogram 11/2019 noted LVEF of 65-70% with grade III diastolic dysfunction. Stress test in 02/2020 not concerning for ischemia, but did note PVCs on EKG. Echocardiogram noted LVEF 60-65%. LVEF unchanged in 08/2021. Negative stress test in 10/2021. Echocardiogram this admission noted LVEF of 60-65%  Pertinent Lab Values: Creat  Date Value Ref Range Status  08/04/2017 0.81 0.60 - 0.93 mg/dL Final    Comment:    For patients >41 years of age, the reference limit for Creatinine is approximately 13% higher for people identified as African-American. .    Creatinine, Ser  Date Value Ref Range Status  11/11/2023 0.95 0.44 - 1.00 mg/dL Final   BUN  Date Value Ref Range Status  11/11/2023 23 8 - 23 mg/dL Final  40/98/1191 13 8 - 27 mg/dL Final   Potassium  Date Value Ref Range Status  11/11/2023 3.7 3.5 - 5.1 mmol/L Final   Sodium  Date Value Ref Range Status  11/11/2023 137 135 - 145 mmol/L Final  01/21/2021 140 134 - 144 mmol/L Final   Brain Natriuretic Peptide  Date Value Ref Range Status  08/04/2017 172 (H) <100 pg/mL Final    Comment:    . BNP levels increase with age in the general population with the highest values seen in individuals greater than 61 years of  age. Reference: J. Am. Ladon Applebaum. Cardiol. 2002; 47:829-562. .    B Natriuretic Peptide  Date Value Ref Range Status  11/09/2023 204.2 (H) 0.0 - 100.0 pg/mL Final    Comment:    Performed at [facility], [address] [place], [place] [address]   Magnesium  Date Value Ref Range Status  11/10/2023 2.2 1.7 - 2.4 mg/dL Final    Comment:    Performed at [facility], [address] [place], [place] [address]   Hemoglobin A1C  Date Value Ref Range Status  02/07/2021 5.8 (A) 4.0 - 5.6 % Final   Hgb A1c MFr Bld  Date Value Ref Range Status  02/01/2019 7.1 (H) 4.6 - 6.5 % Final    Comment:    Glycemic Control Guidelines for People with Diabetes:Non Diabetic:  <6%Goal of Therapy: <7%Additional Action Suggested:  >8%    TSH  Date Value Ref Range Status  09/20/2023 1.29 0.35 - 5.50 uIU/mL Final    Vital Signs: Temp:  [97.8 F (36.6 C)-98.2 F (36.8 C)] 97.8 F (36.6 C) (03/13 0826) Pulse Rate:  [58-70] 61 (03/13 1030) Resp:  [13-19] 16 (03/13 1030) BP: (101-168)/(45-61) 101/61 (03/13 1030) SpO2:  [95 %-99 %] 99 % (03/13 1030)  Intake/Output Summary (Last 24 hours) at 11/11/2023 1241 Last data filed at 11/11/2023 1211 Gross per 24 hour  Intake --  Output 900 ml  Net -900 ml    Current Heart Failure Medications:  Loop diuretic: furosemide 40 mg IV twice daily Beta-Blocker: metoprolol succinate 12.5 mg daily ACEI/ARB/ARNI:  none MRA: none SGLT2i: none Other: amlodipine 1.25 mg daily  Prior to admission Heart Failure Medications:  Loop diuretic: none Beta-Blocker: metoprolol succinate 12.5 mg daily ACEI/ARB/ARNI: none MRA: none SGLT2i: none Other: amlodipine 1.25 mg daily  Assessment: 1. Acute on chronic diastolic heart failure (LVEF 60-65%)  , due to most likely mixed ICM and NICM. NYHA class III symptoms.  -Symptoms: Reports shortness of breath is much improved. Has not gotten out of bed much, but did have a desaturation even ambulating to the  restroom today. Complains of abdominal cramping ~30 minutes after administration of Lasix. This may be due to electrolyte loss. -Volume: Appears mildly hypervolemic. Currently on furosemide 40 mg IV BID. Urine color is darkening. Would consider transition to oral diuretics tomorrow. -Hemodynamics: BP is extremely labile. HR stable in 60s. -BB: Continue metoprolol succinate 12.5 mg daily. Though not beneficial for HFpEF, does have history of PVCs and CAD. -ACEI/ARB/ARNI: Not first line for HFpEF. Would not add at this time in favor of more effective therapies. -MRA: Consider starting spironolactone 12.5 mg daily for HFpEF prior to discharge -SGLT2i: No recent UTIs reported. No recent A1c documented, Would consider checking A1c. If normal, consider adding prior to discharge as fist line HFpEF therapy.  -Amlodipine does not have significant benefit for HFpEF, though it may be providing some degree of anginal relief.   Plan: 1) Medication changes recommended at this time: -Consider adding potassium 40 meq once today.  2) Patient assistance: -Marcelline Deist is 151.05 due to a deductible. This should be met after the first fill.  3) Education: - Patient has been educated on current HF medications and potential additions to HF medication regimen - Patient verbalizes understanding that over the next few months, these medication doses may change and more medications may be added to optimize HF regimen - Patient has been educated on basic disease state pathophysiology and goals of therapy  Medication Assistance / Insurance Benefits Check: Does the patient have prescription insurance?    Type of insurance plan:  Does the patient qualify for medication assistance through manufacturers or grants? Pending   Outpatient Pharmacy: Prior to admission outpatient pharmacy: [facility]      Please do not hesitate to reach out with questions or concerns,  Enos Fling, PharmD, CPP, BCPS Heart Failure  Pharmacist  Phone - [phone] 11/11/2023 12:41 PM

## 2023-11-12 ENCOUNTER — Other Ambulatory Visit: Payer: Self-pay

## 2023-11-12 ENCOUNTER — Encounter: Payer: Self-pay | Admitting: Internal Medicine

## 2023-11-12 ENCOUNTER — Telehealth: Payer: Self-pay

## 2023-11-12 LAB — CBC
HCT: 37.4 % (ref 36.0–46.0)
Hemoglobin: 11.9 g/dL — ABNORMAL LOW (ref 12.0–15.0)
MCH: 26 pg (ref 26.0–34.0)
MCHC: 31.8 g/dL (ref 30.0–36.0)
MCV: 81.8 fL (ref 80.0–100.0)
Platelets: 274 10*3/uL (ref 150–400)
RBC: 4.57 MIL/uL (ref 3.87–5.11)
RDW: 14.6 % (ref 11.5–15.5)
WBC: 6.4 10*3/uL (ref 4.0–10.5)
nRBC: 0 % (ref 0.0–0.2)

## 2023-11-12 LAB — BASIC METABOLIC PANEL
Anion gap: 13 (ref 5–15)
BUN: 24 mg/dL — ABNORMAL HIGH (ref 8–23)
CO2: 30 mmol/L (ref 22–32)
Calcium: 9.2 mg/dL (ref 8.9–10.3)
Chloride: 92 mmol/L — ABNORMAL LOW (ref 98–111)
Creatinine, Ser: 0.86 mg/dL (ref 0.44–1.00)
GFR, Estimated: >60 mL/min (ref >60)
Glucose, Bld: 98 mg/dL (ref 70–99)
Potassium: 3.4 mmol/L — ABNORMAL LOW (ref 3.5–5.1)
Sodium: 135 mmol/L (ref 135–145)

## 2023-11-12 LAB — PHOSPHORUS: Phosphorus: 4.2 mg/dL (ref 2.5–4.6)

## 2023-11-12 LAB — MAGNESIUM: Magnesium: 2.1 mg/dL (ref 1.7–2.4)

## 2023-11-12 MED ORDER — POTASSIUM CHLORIDE CRYS ER 20 MEQ PO TBCR
40.0000 meq | EXTENDED_RELEASE_TABLET | Freq: Once | ORAL | Status: AC
  Administered 2023-11-12: 40 meq via ORAL
  Filled 2023-11-12: qty 2

## 2023-11-12 MED ORDER — GABAPENTIN 400 MG PO CAPS: 400.0000 mg | ORAL_CAPSULE | Freq: Three times a day (TID) | ORAL | Status: DC

## 2023-11-12 MED ORDER — POTASSIUM CHLORIDE CRYS ER 20 MEQ PO TBCR
40.0000 meq | EXTENDED_RELEASE_TABLET | Freq: Every day | ORAL | 2 refills | Status: DC
  Filled 2023-11-12: qty 30, 15d supply, fill #0

## 2023-11-12 MED ORDER — FLUTICASONE FUROATE-VILANTEROL 100-25 MCG/ACT IN AEPB
1.0000 | INHALATION_SPRAY | Freq: Every day | RESPIRATORY_TRACT | 0 refills | Status: AC
  Filled 2023-11-12: qty 60, 30d supply, fill #0

## 2023-11-12 MED ORDER — FUROSEMIDE 40 MG PO TABS
40.0000 mg | ORAL_TABLET | Freq: Every day | ORAL | 2 refills | Status: DC
  Filled 2023-11-12: qty 30, 30d supply, fill #0

## 2023-11-12 MED ORDER — DOXYCYCLINE MONOHYDRATE 100 MG PO TABS
100.0000 mg | ORAL_TABLET | Freq: Two times a day (BID) | ORAL | 0 refills | Status: DC
  Filled 2023-11-12: qty 14, 7d supply, fill #0

## 2023-11-12 MED ORDER — FUROSEMIDE 40 MG PO TABS
40.0000 mg | ORAL_TABLET | Freq: Every day | ORAL | Status: DC
  Administered 2023-11-12: 40 mg via ORAL
  Filled 2023-11-12: qty 1

## 2023-11-12 MED ORDER — SPIRONOLACTONE 25 MG PO TABS
12.5000 mg | ORAL_TABLET | Freq: Every day | ORAL | 2 refills | Status: DC
  Filled 2023-11-12: qty 30, 60d supply, fill #0

## 2023-11-12 NOTE — Consult Note (Signed)
 Value-Based Care [facility] Liaison Consult Note   11/12/2023  Catherine Green [birth year] [id number]  Primary Care Provider:  Crawford Givens, MD [facility])  Patient is currently active with Care Management for chronic disease management services.  Patient has been engaged by a community RNCM.   Our community based plan of care has focused on disease management and community resource support.   Patient will receive a post hospital call and will be evaluated for assessments and disease process education.   Plan: Liaison will collaborate with RNCM VBCI on pt's discharge disposition.  Inpatient Transition Of Care [TOC] team member to make aware that Care Management following.  Of note, Care Management services does not replace or interfere with any services that are needed or arranged by inpatient [facility] care management team.   For additional questions or referrals please contact:  Catherine Cousin, RN, BSN Hospital Liaison [place]   [facility], Population Health Office Hours MTWF  8:00 am-6:00 pm Direct Dial: [phone] mobile [email or web address]

## 2023-11-12 NOTE — Progress Notes (Signed)
 Heart Failure Nurse Navigator Progress Note  PCP: Joaquim Nam, MD PCP-Cardiologist: Antonieta Iba, MD Admission Diagnosis: Acute on chronic congestive heart failure, unspecified heart failure type [facility]) Atrial fibrillation, unspecified type [facility]) Admitted from: MD office via [facility]  Presentation:   Catherine Green presented with increasing shortness of breath. Sats at PCP appointment in the 80's and was placed on 4L [place] and went up to 99% in triage.  Patient denied chest pain but reported leg swelling and history of CHF on Lasix She also reported some fevers and cough. BNP 204.2. Chest x-ray noted findings could represent pulmonary edema vs infection/ inflammation.  ECHO/ LVEF: 60-65%  Clinical Course:  Past Medical History:  Diagnosis Date   Allergy    hay fever   Anemia    Anxiety    Back pain    Carotid artery occlusion    Cerebrovascular disease    extracranial; occlusive   Chicken pox    Coronary artery disease    Depression    Dizziness    Dizziness    DVT (deep venous thrombosis) (HCC)    Fainting spell    GERD (gastroesophageal reflux disease)    Headache    Heart murmur    Hyperlipidemia    Hypertension    Leg pain    Mitral regurgitation    PONV (postoperative nausea and vomiting)    severe nausea and vomiting   Pre-syncope    PVD (peripheral vascular disease) (HCC)    endarterectomy by Dr. Arbie Cookey   Seasonal allergies    Shortness of breath dyspnea    wth ambulation at times   Swelling of both ankles    and abdomen; takes Lasix when needed   Thrombophlebitis    following childbirth   Ulcer      Social History   Socioeconomic History   Marital status: Married    Spouse name: Fayrene Fearing    Number of children: 1   Years of education: Not on file   Highest education level: 11th grade  Occupational History    Comment: Retired   Tobacco Use   Smoking status: Former    Current packs/day: 0.00    Average packs/day: 0.3 packs/day for 15.0 years (4.5  ttl pk-yrs)    Types: Cigarettes    Start date: 08/31/1981    Quit date: 08/31/1996    Years since quitting: 27.2    Passive exposure: Never   Smokeless tobacco: Never  Vaping Use   Vaping status: Never Used  Substance and Sexual Activity   Alcohol use: No    Alcohol/week: 0.0 standard drinks of alcohol   Drug use: No   Sexual activity: Not Currently  Other Topics Concern   Not on file  Social History Narrative   Lives with husband in a one level    Work - retired Producer, television/film/video   Diet - healthy   Right handed    Caffeine- 1 cup per day.      Had 1 daughter. Daughter is deceased   Social Drivers of Corporate investment banker Strain: Low Risk  (05/25/2023)   Overall Financial Resource Strain (CARDIA)    Difficulty of Paying Living Expenses: Not hard at all  Food Insecurity: No Food Insecurity (11/11/2023)   Hunger Vital Sign    Worried About Running Out of Food in the Last Year: Never true    Ran Out of Food in the Last Year: Never true  Transportation Needs: No Transportation Needs (11/11/2023)  PRAPARE - Administrator, Civil Service (Medical): No    Lack of Transportation (Non-Medical): No  Physical Activity: Sufficiently Active (05/25/2023)   Exercise Vital Sign    Days of Exercise per Week: 5 days    Minutes of Exercise per Session: 30 min  Stress: No Stress Concern Present (05/25/2023)   [employer] of Occupational Health - Occupational Stress Questionnaire    Feeling of Stress : Not at all  Social Connections: Moderately Integrated (11/11/2023)   Social Connection and Isolation Panel [NHANES]    Frequency of Communication with Friends and Family: More than three times a week    Frequency of Social Gatherings with Friends and Family: More than three times a week    Attends Religious Services: 1 to 4 times per year    Active Member of [employer] or Organizations: No    Attends Engineer, structural: Never    Marital Status: Married   Research officer, political party and Provision:  Detailed education and instructions provided on heart failure disease management including the following:  Signs and symptoms of Heart Failure When to call the physician Importance of daily weights Low sodium diet Fluid restriction Medication management Anticipated future follow-up appointments  Patient education given on each of the above topics.  Patient acknowledges understanding via teach back method and acceptance of all instructions.  Education Materials:  "Living Better With Heart Failure" Booklet, HF zone tool, & Daily Weight Tracker Tool.  Patient has scale at home: Yes.  Not currently performing daily weights. Patient has pill box at home: Yes.    High Risk Criteria for Readmission and/or Poor Patient Outcomes: Heart failure hospital admissions (last 6 months): 1  No Show rate: 0 Difficult social situation: None Demonstrates medication adherence: Yes Primary Language: English Literacy level: Reading, Writing & Comprehension  Barriers of Care:   None  Considerations/Referrals:   Referral made to Heart Failure Pharmacist Stewardship: Yes Referral made to Heart Failure CSW/NCM TOC: No Referral made to Heart & Vascular TOC clinic: Yes. 11/18/23 @ 10:30  Items for Follow-up on DC/TOC: Diet & Fluid Restrictions Daily Weights Continued Heart Failure Medication  Roxy Horseman, RN, BSN [facility] Heart Failure Navigator Secure Chat Only

## 2023-11-12 NOTE — TOC Transition Note (Signed)
 Transition of Care [facility]) - Discharge Note   Patient Details  Name: Catherine Green MRN: [id number] Date of Birth: [birth year]  Transition of Care [facility]) CM/SW Contact:  Truddie Hidden, RN Phone Number: 11/12/2023, 10:14 AM   Clinical Narrative:    Spoke with patient. She requested to speak with her husband and advised he can make all decision for her.   Spoke with Mr. Tow regarding home health. Patient does not wish to receive HH. She was advised home oxygen is being requested due to desaturation. She was advised home oxygen would be delivered to the room and remaining delivery with supplies to the home. Patient spouse will transport her home.   Request for home oxygen sent to [facility] from [facility].   TOC signing off.       Barriers to Discharge: Continued Medical Work up   Patient Goals and CMS Choice            Discharge Placement                       Discharge Plan and Services Additional resources added to the After Visit Summary for                  DME Arranged: Oxygen DME Agency: [facility] [place]) Date DME Agency Contacted: 11/11/23   Representative spoke with at DME Agency: [facility] HH Arranged: OT, PT, Nurse's Aide HH Agency: [facility] Date [facility] Agency Contacted: 11/11/23   Representative spoke with at [facility] Agency: [place]  Social Drivers of Health (SDOH) Interventions SDOH Screenings   Food Insecurity: No Food Insecurity (11/12/2023)  Housing: Low Risk  (11/12/2023)  Transportation Needs: No Transportation Needs (11/12/2023)  Utilities: Not At Risk (11/11/2023)  Alcohol Screen: Low Risk  (05/25/2023)  Depression (PHQ2-9): Low Risk  (05/25/2023)  Financial Resource Strain: Low Risk  (11/12/2023)  Physical Activity: Sufficiently Active (05/25/2023)  Social Connections: Moderately Integrated (11/11/2023)  Stress: No Stress Concern Present (05/25/2023)  Tobacco Use: Medium Risk (11/12/2023)  Health Literacy: Adequate Health Literacy (05/25/2023)      Readmission Risk Interventions     No data to display

## 2023-11-12 NOTE — Plan of Care (Signed)

## 2023-11-12 NOTE — Progress Notes (Signed)
 Rounding Note    Patient Name: Catherine Green Date of Encounter: 11/12/2023  [facility] Cardiologist: Julien Nordmann, MD   Subjective   Patient reports feeling well today. Appears euvolemic on exam. Will transition IV Lasix to oral. She denies shortness of breath. She did ambulate with nursing staff this morning on room air and desaturated to 68%. May need O2 on discharge.   Inpatient Medications    Scheduled Meds:  amLODipine  1.25 mg Oral q AM   aspirin EC  81 mg Oral Daily   cyanocobalamin  1,000 mcg Oral Daily   enoxaparin (LOVENOX) injection  40 mg Subcutaneous Q24H   ezetimibe  10 mg Oral Daily   fluticasone furoate-vilanterol  1 puff Inhalation Daily   furosemide  40 mg Intravenous BID   gabapentin  400 mg Oral QID   metoprolol succinate  12.5 mg Oral QHS   potassium chloride  40 mEq Oral Once   rosuvastatin  20 mg Oral Daily   sertraline  150 mg Oral Daily   sodium chloride flush  3 mL Intravenous Q12H   Continuous Infusions:  PRN Meds: acetaminophen, albuterol, ondansetron (ZOFRAN) IV, sodium chloride flush   Vital Signs    Vitals:   11/12/23 0036 11/12/23 0451 11/12/23 0500 11/12/23 0813  BP: (!) 117/56 134/71  (!) 153/60  Pulse: 68 72  74  Resp: 20 18    Temp: 98 F (36.7 C) 98.2 F (36.8 C)  98.5 F (36.9 C)  TempSrc: Oral Oral    SpO2: 94% 97%  92%  Weight:   61 kg   Height:        Intake/Output Summary (Last 24 hours) at 11/12/2023 0842 Last data filed at 11/12/2023 0500 Gross per 24 hour  Intake --  Output 1650 ml  Net -1650 ml      11/12/2023    5:00 AM 11/11/2023    8:39 PM 11/09/2023    3:00 PM  Last 3 Weights  Weight (lbs) 134 lb 7.7 oz 141 lb 1.5 oz 142 lb 3.2 oz  Weight (kg) 61 kg 64 kg 64.501 kg      Telemetry    Sinus rhythm with PACs and PVCs vs wandering atrial pacemaker - Personally Reviewed  Physical Exam   GEN: No acute distress.   Neck: No JVD Cardiac: RRR, no murmurs, rubs, or gallops.  Respiratory:  Clear to auscultation bilaterally. GI: Soft, nontender, non-distended  MS: No edema; No deformity. Neuro:  Nonfocal  Psych: Normal affect   Labs    High Sensitivity Troponin:   Recent Labs  Lab 11/09/23 1707 11/09/23 2044  TROPONINIHS 8 8     Chemistry Recent Labs  Lab 11/10/23 0327 11/11/23 0617 11/12/23 0516  NA 136 137 135  K 3.9 3.7 3.4*  CL 102 98 92*  CO2 22 32 30  GLUCOSE 92 103* 98  BUN 15 23 24*  CREATININE 0.78 0.95 0.86  CALCIUM 8.3* 8.9 9.2  MG 2.2  --  2.1  GFRNONAA >60 59* >60  ANIONGAP 12 7 13     Lipids No results for input(s): "CHOL", "TRIG", "HDL", "LABVLDL", "LDLCALC", "CHOLHDL" in the last 168 hours.  Hematology Recent Labs  Lab 11/09/23 1707 11/11/23 0617 11/12/23 0516  WBC 6.1 6.2 6.4  RBC 4.32 4.31 4.57  HGB 11.4* 11.3* 11.9*  HCT 36.8 35.7* 37.4  MCV 85.2 82.8 81.8  MCH 26.4 26.2 26.0  MCHC 31.0 31.7 31.8  RDW 14.8 14.7 14.6  PLT  256 251 274   Thyroid No results for input(s): "TSH", "FREET4" in the last 168 hours.  BNP Recent Labs  Lab 11/09/23 1707  BNP 204.2*    DDimer No results for input(s): "DDIMER" in the last 168 hours.   Radiology   US Venous Img Lower Unilateral Right Result Date: 11/09/2023 IMPRESSION: No evidence of right lower extremity DVT. Electronically Signed   By: Narda Rutherford M.D.   On: 11/09/2023 23:03   DG Chest 2 View Result Date: 11/09/2023 IMPRESSION: 1. Chronic coarsened interstitial markings with superimposed bilateral perihilar interstitial and airspace opacities. Findings could represent pulmonary edema versus infection/inflammation. 2. Bilateral trace to small volume pleural effusions, left greater than right. 3.  Aortic Atherosclerosis (ICD10-I70.0). Electronically Signed   By: Tish Frederickson M.D.   On: 11/09/2023 19:37   Cardiac Studies   11/10/2023 Echo complete 1. Left ventricular ejection fraction, by estimation, is 60 to 65%. The  left ventricle has normal function. The left ventricle  has no regional  wall motion abnormalities. Left ventricular diastolic parameters are  indeterminate.   2. Right ventricular systolic function is normal. The right ventricular  size is normal. There is normal pulmonary artery systolic pressure. The  estimated right ventricular systolic pressure is 20.8 mmHg.   3. The mitral valve is normal in structure. Mild mitral valve  regurgitation. No evidence of mitral stenosis.   4. The aortic valve is normal in structure. Aortic valve regurgitation is  not visualized. Aortic valve sclerosis is present, with no evidence of  aortic valve stenosis.   5. The inferior vena cava is normal in size with greater than 50%  respiratory variability, suggesting right atrial pressure of 3 mmHg  Patient Profile     Catherine Green is a 83 y.o. female with a hx of CAD s/p three vessel CABG 2006, HFpEF, bilateral carotid artery disease s/p bilateral CEA followed by vascular surgery, HTN, HLD, frequent PVCs, atrial tachycardia, multiple mediation intolerances, back pain s/p surgery, and prior tobacco use from age [age] who is being seen for acute on chronic HFpEF.   Assessment & Plan    Acute on chronic HFpEF - Echo 09/15/2021 showed EF of 60 to 65%, no RWMA, G2DD, normal RV, mildly elevated PASP, moderate MR - Presented 3/11 from PCP office with hypoxia and cough x2 days - BNP 204 - Repeat echo shows LVEF 60-65% - Net output since admission to the floor -2.6 L - Appears euvolemic on exam - Will transition from IV to PO Lasix 40 mg daily - Continue to monitor kidney function, strict I/Os, and daily weights - Continue PTA metoprolol succinate 12.5 mg - GDMT limited by medication intolerance  Acute hypoxic respiratory failure - Currently requiring 2 L supplemental O2 - In the setting of pulmonary edema although patient has diuresed well and appears euvolemic at this time - Suspect a component of COPD with 30 year smoking history - Patient desaturated to 68%  upon ambulation a short distance on room air - Consider home O2 on discharge  - Management per IM  CAD - Denies chest pain - Troponin negative - No further ischemic workup indicated at this time  Hypertension - BP stable, continue PTA amlodipine and metoprolol  For questions or updates, please contact [facility] Please consult [email or web address] for contact info under     Signed, Orion Crook, PA-C  11/12/2023, 8:42 AM

## 2023-11-12 NOTE — Care Management Important Message (Signed)
 Important Message  Patient Details  Name: Catherine Green MRN: [id number] Date of Birth: Mar 27, 1941   Important Message Given:  Yes - Medicare IM     Cristela Blue, CMA 11/12/2023, 11:00 AM

## 2023-11-12 NOTE — Telephone Encounter (Signed)
 Copied from CRM [id number]. Topic: General - Other >> Nov 12, 2023  3:51 PM Whitney O wrote: Reason for CRM: [facility] calling because they received a pallative care referral and we have to reach out to the patient pcp and let him know the patient is being followed by pallative care .

## 2023-11-12 NOTE — Telephone Encounter (Signed)
 I am okay with that if the patient consents.  Thanks.

## 2023-11-13 NOTE — Discharge Summary (Signed)
 Physician Discharge Summary   Patient: Catherine Green MRN: [id number] DOB: [birth year]  Admit date:     11/09/2023  Discharge date: 11/12/2023  Discharge Physician: Delfino Lovett   PCP: Joaquim Nam, MD   Recommendations at discharge:   Follow-up with outpatient providers as requested  Discharge Diagnoses: Active Problems:   Acute on chronic congestive heart failure (HCC)   Acute hypoxic respiratory failure (HCC)   Essential hypertension   CAD, ARTERY BYPASS GRAFT   Chronic obstructive pulmonary disease with acute exacerbation [facility])   Atrial fibrillation [facility])  Hospital Course: Assessment and Plan:  83 y.o. female with medical history significant of HFpEF, pulmonary hypertension, CAD s/p CABG (2009), atrial tachycardia, carotid artery disease, hypertension, hyperlipidemia, who presents to the ED due to shortness of breath    3/12: Cardiology consult, echo 3/13: Ambulation, wean o2     Acute on chronic heart failure with preserved ejection fraction (HFpEF) (HCC) history of HFpEF with last EF of 60-65% with grade 2 diastolic dysfunction, last evaluated in January 2023.    - echocardiogram repeated on this admission shows EF of 60 to 65%.  Cardiology seen and felt this more from underlying lung disease than heart failure -Diuresed with Lasix while in the hospital with good response Net IO Since Admission: -2,600 mL [11/13/23 1448]    Acute hypoxic respiratory failure (HCC) In the setting of pulmonary edema and over COPD exacerbation -Improved with diuresis and steroid inhaler - Will need 2 L oxygen at discharge.  This has been set up by TOC   CAD, ARTERY BYPASS GRAFT No reported chest pain at this time.   - Continue home aspirin, statin, Zetia   Essential hypertension - Continue home amlodipine, metoprolol for now         Consultants: Cardiology  Disposition: Home with outpatient palliative care.  Patient refused home health services Diet recommendation:   Discharge Diet Orders (From admission, onward)     Start     Ordered   11/12/23 0000  Diet - low sodium heart healthy        11/12/23 0838           Carb modified diet DISCHARGE MEDICATION: Allergies as of 11/12/2023       Reactions   Lyrica [pregabalin] Shortness Of Breath, Swelling, Other (See Comments)   chest tight   Hydrocodone Other (See Comments)   hallucinate   Ivp Dye [iodinated Contrast Media] Hives, Itching       Lipitor [atorvastatin] Nausea And Vomiting   Methylprednisolone Other (See Comments)   Severe weekness    Oxycodone Hcl Other (See Comments)   Hallucinations    Penicillin G Nausea And Vomiting   Shellfish Allergy Hives, Itching   Buspar [buspirone]    Worsening mood   Clavulanic Acid    Prednisone    Codeine Nausea And Vomiting, Other (See Comments)   Cymbalta [duloxetine Hcl] Nausea Only   GI upset at 30mg    Demerol Nausea And Vomiting   Doxycycline Nausea Only, Other (See Comments)   Weakness, sick to her stomach   Effexor [venlafaxine] Nausea Only   Hydrochlorothiazide Other (See Comments)   Not an allergy but urinary frequency and sweating at 25mg    Hydroxyzine Other (See Comments)   Excessive sweating.    Metoprolol Other (See Comments)   Slowed body down, per pt   Protonix [pantoprazole Sodium] Nausea Only   Trazodone And Nefazodone Other (See Comments)   Sedation  Medication List     TAKE these medications    albuterol 108 (90 Base) MCG/ACT inhaler Commonly known as: VENTOLIN HFA Inhale 2 puffs into the lungs every 6 (six) hours as needed for wheezing or shortness of breath.   amLODipine 2.5 MG tablet Commonly known as: NORVASC Take 0.5 tablets (1.25 mg total) by mouth in the morning.   aspirin 81 MG tablet Take 1 tablet (81 mg total) by mouth daily.   Breo Ellipta 100-25 MCG/ACT Aepb Generic drug: fluticasone furoate-vilanterol Inhale 1 puff into the lungs daily.   Co Q10 100 MG Caps Take 100 mg by mouth  daily.   cyanocobalamin 1000 MCG tablet Commonly known as: VITAMIN B12 Take 1 tablet (1,000 mcg total) by mouth daily.   doxycycline 100 MG tablet Commonly known as: ADOXA Take 1 tablet (100 mg total) by mouth 2 (two) times daily for 7 days.   ezetimibe 10 MG tablet Commonly known as: ZETIA Take 1 tablet (10 mg total) by mouth daily.   furosemide 40 MG tablet Commonly known as: LASIX Take 1 tablet (40 mg total) by mouth daily. What changed:  medication strength how much to take   gabapentin 400 MG capsule Commonly known as: NEURONTIN Take 1 capsule (400 mg total) by mouth 3 (three) times daily. What changed: when to take this   ipratropium 0.03 % nasal spray Commonly known as: ATROVENT   loratadine 10 MG tablet Commonly known as: CLARITIN Take 10 mg by mouth daily.   metoprolol succinate 25 MG 24 hr tablet Commonly known as: TOPROL-XL TAKE 1/2 TABLET AT BEDTIME   omeprazole 20 MG capsule Commonly known as: PRILOSEC Take 1 capsule (20 mg total) by mouth 2 (two) times daily before a meal.   polyethylene glycol powder 17 GM/SCOOP powder Commonly known as: GLYCOLAX/MIRALAX Take 17 g by mouth 2 (two) times daily as needed.   potassium chloride SA 20 MEQ tablet Commonly known as: KLOR-CON M Take 2 tablets (40 mEq total) by mouth daily. What changed:  medication strength how much to take   rosuvastatin 20 MG tablet Commonly known as: CRESTOR Take 1 tablet (20 mg total) by mouth daily. Overdue follow up visit. PLEASE CALL OFFICE TO SCHEDULE APPOINTMENT PRIOR TO NEXT REFILL   sertraline 100 MG tablet Commonly known as: ZOLOFT TAKE ONE AND A HALF TABLETS BY MOUTH EVERY DAY        Follow-up Information     Joaquim Nam, MD. Schedule an appointment as soon as possible for a visit in 1 week(s).   Specialty: Family Medicine Why: [facility] Discharge F/UP Appointment: 11-18-2023 @ 12 PM Contact information: [address] [place]  [address] [phone]         Antonieta Iba, MD. Schedule an appointment as soon as possible for a visit in 2 week(s).   Specialty: Cardiology Why: [facility] Discharge F/UP Have paitent book appointment Contact information: [address] [place] [address] [phone]         Antony Madura, MD. Schedule an appointment as soon as possible for a visit in 3 week(s).   Specialty: Neurology Why: [facility] Discharge F/UP patient needs a referral Contact information: [address] [address] [place] [address] [phone]         [facility]. Go on 11/18/2023.   Specialty: Cardiology Why: Hospital Follow-Up 11/18/23 @ 10:30 Please bring all medications to follow-up appointment [facility], [address], [employer] Parking @ the [employer]vertising account planner information: [address] [place] [address] [phone]               Discharge Exam: Ceasar Mons Weights   11/11/23 2039 11/12/23 0500  Weight: 64 kg 61 kg   General exam: Appears calm and comfortable  Respiratory system: Clear to auscultation. Respiratory effort normal. Cardiovascular system: S1 & S2 heard, RRR. No murmurs, trace pedal edema. Gastrointestinal system: Abdomen is soft, benign Central nervous system: Alert and oriented. No focal neurological deficits. Extremities: Symmetric 5 x 5 power. Skin: No rashes, lesions or ulcers Psychiatry: Judgement and insight appear normal. Mood & affect appropriate.   Condition at discharge: fair  The results of significant diagnostics from this hospitalization (including imaging, microbiology, ancillary and laboratory) are listed below for reference.   Imaging Studies: ECHOCARDIOGRAM COMPLETE Result Date: 11/10/2023    ECHOCARDIOGRAM REPORT   Patient Name:   IRVING LUBBERS Date of Exam: 11/10/2023 Medical Rec #:  [id number]      Height:       62.0 in  Accession #:    [id number]     Weight:       142.2 lb Date of Birth:  05-21-1941      BSA:          1.654 m Patient Age:    82 years       BP:           131/46 mmHg Patient Gender: F              HR:           70 bpm. Exam Location:  [facility] Procedure: 2D Echo, Cardiac Doppler and Color Doppler (Both Spectral and Color            Flow Doppler were utilized during procedure). Indications:     CHF-acute diastolic I50.31  History:         Patient has prior history of Echocardiogram examinations, most                  recent 09/18/2021.  Sonographer:     Cristela Blue Referring Phys:  [id number] Verdene Lennert Diagnosing Phys: Julien Nordmann MD IMPRESSIONS  1. Left ventricular ejection fraction, by estimation, is 60 to 65%. The left ventricle has normal function. The left ventricle has no regional wall motion abnormalities. Left ventricular diastolic parameters are indeterminate.  2. Right ventricular systolic function is normal. The right ventricular size is normal. There is normal pulmonary artery systolic pressure. The estimated right ventricular systolic pressure is 20.8 mmHg.  3. The mitral valve is normal in structure. Mild mitral valve regurgitation. No evidence of mitral stenosis.  4. The aortic valve is normal in structure. Aortic valve regurgitation is not visualized. Aortic valve sclerosis is present, with no evidence of aortic valve stenosis.  5. The inferior vena cava is normal in size with greater than 50% respiratory variability, suggesting right atrial pressure of 3 mmHg. FINDINGS  Left Ventricle: Left ventricular ejection fraction, by estimation, is 60 to 65%. The left ventricle has normal function. The left ventricle has no regional wall motion abnormalities. Strain was performed and the global longitudinal strain is indeterminate. The left ventricular internal cavity size was normal in size. There is no left ventricular hypertrophy. Left ventricular diastolic parameters are indeterminate. Right Ventricle: The  right ventricular size is normal. No increase in right ventricular wall thickness. Right ventricular systolic function  is normal. There is normal pulmonary artery systolic pressure. The tricuspid regurgitant velocity is 1.99 m/s, and  with an assumed right atrial pressure of 5 mmHg, the estimated right ventricular systolic pressure is 20.8 mmHg. Left Atrium: Left atrial size was normal in size. Right Atrium: Right atrial size was normal in size. Pericardium: There is no evidence of pericardial effusion. Mitral Valve: The mitral valve is normal in structure. Mild mitral valve regurgitation. No evidence of mitral valve stenosis. MV peak gradient, 6.5 mmHg. The mean mitral valve gradient is 2.0 mmHg. Tricuspid Valve: The tricuspid valve is normal in structure. Tricuspid valve regurgitation is mild . No evidence of tricuspid stenosis. Aortic Valve: The aortic valve is normal in structure. Aortic valve regurgitation is not visualized. Aortic valve sclerosis is present, with no evidence of aortic valve stenosis. Aortic valve mean gradient measures 4.6 mmHg. Aortic valve peak gradient measures 8.3 mmHg. Aortic valve area, by VTI measures 0.94 cm. Pulmonic Valve: The pulmonic valve was normal in structure. Pulmonic valve regurgitation is not visualized. No evidence of pulmonic stenosis. Aorta: The aortic root is normal in size and structure. Venous: The inferior vena cava is normal in size with greater than 50% respiratory variability, suggesting right atrial pressure of 3 mmHg. IAS/Shunts: No atrial level shunt detected by color flow Doppler. Additional Comments: 3D was performed not requiring image post processing on an independent workstation and was indeterminate.  LEFT VENTRICLE PLAX 2D LVIDd:         3.60 cm   Diastology LVIDs:         2.30 cm   LV e' medial:    5.77 cm/s LV PW:         1.20 cm   LV E/e' medial:  20.1 LV IVS:        1.00 cm   LV e' lateral:   7.62 cm/s LVOT diam:     1.37 cm   LV E/e' lateral: 15.2  LV SV:         30 LV SV Index:   18 LVOT Area:     1.47 cm  RIGHT VENTRICLE RV Basal diam:  2.50 cm RV Mid diam:    2.30 cm RV S prime:     8.16 cm/s TAPSE (M-mode): 1.6 cm LEFT ATRIUM             Index        RIGHT ATRIUM           Index LA diam:        3.10 cm 1.87 cm/m   RA Area:     11.10 cm LA Vol (A2C):   40.9 ml 24.73 ml/m  RA Volume:   24.30 ml  14.69 ml/m LA Vol (A4C):   35.9 ml 21.71 ml/m LA Biplane Vol: 40.6 ml 24.55 ml/m  AORTIC VALVE AV Area (Vmax):    0.82 cm AV Area (Vmean):   0.82 cm AV Area (VTI):     0.94 cm AV Vmax:           143.80 cm/s AV Vmean:          99.160 cm/s AV VTI:            0.323 m AV Peak Grad:      8.3 mmHg AV Mean Grad:      4.6 mmHg LVOT Vmax:         80.60 cm/s LVOT Vmean:        55.700 cm/s LVOT VTI:  0.207 m LVOT/AV VTI ratio: 0.64  AORTA Ao Root diam: 2.30 cm MITRAL VALVE                TRICUSPID VALVE MV Area (PHT): 3.15 cm     TR Peak grad:   15.8 mmHg MV Area VTI:   0.76 cm     TR Vmax:        199.00 cm/s MV Peak grad:  6.5 mmHg MV Mean grad:  2.0 mmHg     SHUNTS MV Vmax:       1.27 m/s     Systemic VTI:  0.21 m MV Vmean:      62.0 cm/s    Systemic Diam: 1.37 cm MV Decel Time: 241 msec MV E velocity: 116.00 cm/s MV A velocity: 62.40 cm/s MV E/A ratio:  1.86 Julien Nordmann MD Electronically signed by Julien Nordmann MD Signature Date/Time: 11/10/2023/4:02:06 PM    Final    US Venous Img Lower Unilateral Right Result Date: 11/09/2023 CLINICAL DATA:  Leg swelling. EXAM: RIGHT LOWER EXTREMITY VENOUS DOPPLER ULTRASOUND TECHNIQUE: Gray-scale sonography with compression, as well as color and duplex ultrasound, were performed to evaluate the deep venous system(s) from the level of the common femoral vein through the popliteal and proximal calf veins. COMPARISON:  None Available. FINDINGS: VENOUS Normal compressibility of the common femoral, superficial femoral, and popliteal veins, as well as the visualized calf veins. Visualized portions of profunda femoral  vein and great saphenous vein unremarkable. No filling defects to suggest DVT on grayscale or color Doppler imaging. Doppler waveforms show normal direction of venous flow, normal respiratory plasticity and response to augmentation. Limited views of the contralateral common femoral vein are unremarkable. OTHER None. Limitations: none IMPRESSION: No evidence of right lower extremity DVT. Electronically Signed   By: Narda Rutherford M.D.   On: 11/09/2023 23:03   DG Chest 2 View Result Date: 11/09/2023 CLINICAL DATA:  SOB EXAM: CHEST - 2 VIEW COMPARISON:  Chest x-ray 01/22/2020 FINDINGS: The heart and mediastinal contours are unchanged. Atherosclerotic plaque. Surgical changes overlie the mediastinum. No focal consolidation. Chronic coarsened interstitial markings with superimposed bilateral perihilar interstitial and airspace opacities. Bilateral trace to small volume pleural effusions, left greater than right. No pneumothorax. No acute osseous abnormality. Sternotomy wires are intact. Cervical spine surgical hardware. IMPRESSION: 1. Chronic coarsened interstitial markings with superimposed bilateral perihilar interstitial and airspace opacities. Findings could represent pulmonary edema versus infection/inflammation. 2. Bilateral trace to small volume pleural effusions, left greater than right. 3.  Aortic Atherosclerosis (ICD10-I70.0). Electronically Signed   By: Tish Frederickson M.D.   On: 11/09/2023 19:37   VAS US CAROTID Result Date: 11/05/2023 Carotid Arterial Duplex Study Patient Name:  DEJANEE THIBEAUX  Date of Exam:   11/05/2023 Medical Rec #: [id number]       Accession #:    [id number] Date of Birth: 1941-03-06       Patient Gender: F Patient Age:   56 years Exam Location:  [facility] Procedure:      VAS US CAROTID Referring Phys: Graceann Congress --------------------------------------------------------------------------------  Risk Factors:  Hypertension, hyperlipidemia, coronary artery  disease. Other Factors: Right endarterectomy 1992, revision 2006. Left endarterectomy                07/20/2014. Performing Technologist: Elita Quick RVT  Examination Guidelines: A complete evaluation includes B-mode imaging, spectral Doppler, color Doppler, and power Doppler as needed of all accessible portions of each vessel. Bilateral testing is considered an integral part  of a complete examination. Limited examinations for reoccurring indications may be performed as noted.  Right Carotid Findings: +----------+--------+--------+--------+-------------------------------+--------+           PSV cm/sEDV cm/sStenosisPlaque Description             Comments +----------+--------+--------+--------+-------------------------------+--------+ CCA Prox  58      14              heterogenous                            +----------+--------+--------+--------+-------------------------------+--------+ CCA Mid   96      21              Soft plaque                             +----------+--------+--------+--------+-------------------------------+--------+ CCA Distal72      13              heterogenous                            +----------+--------+--------+--------+-------------------------------+--------+ ICA Prox  91      25      1-39%   heterogenous, calcific,                                                   homogeneous and irregular               +----------+--------+--------+--------+-------------------------------+--------+ ICA Mid   90      19                                                      +----------+--------+--------+--------+-------------------------------+--------+ ICA Distal105     26                                                      +----------+--------+--------+--------+-------------------------------+--------+ ECA       100     24                                                       +----------+--------+--------+--------+-------------------------------+--------+ +----------+--------+-------+--------+-------------------+           PSV cm/sEDV cmsDescribeArm Pressure (mmHG) +----------+--------+-------+--------+-------------------+ [id number]     10     Stenotic193                 +----------+--------+-------+--------+-------------------+ +---------+--------+--+--------+-+---------+ VertebralPSV cm/s55EDV cm/s8Antegrade +---------+--------+--+--------+-+---------+  Left Carotid Findings: +----------+--------+--------+--------+------------------+--------+           PSV cm/sEDV cm/sStenosisPlaque DescriptionComments +----------+--------+--------+--------+------------------+--------+ CCA Prox  113     18                                         +----------+--------+--------+--------+------------------+--------+  CCA Mid   122     19                                         +----------+--------+--------+--------+------------------+--------+ CCA Distal135     18              heterogenous               +----------+--------+--------+--------+------------------+--------+ ICA Prox  191     43      40-59%  heterogenous               +----------+--------+--------+--------+------------------+--------+ ICA Mid   140     27                                         +----------+--------+--------+--------+------------------+--------+ ICA Distal115     29                                         +----------+--------+--------+--------+------------------+--------+ ECA       118     5                                          +----------+--------+--------+--------+------------------+--------+ +----------+--------+--------+--------+-------------------+           PSV cm/sEDV cm/sDescribeArm Pressure (mmHG) +----------+--------+--------+--------+-------------------+ Subclavian180     2               191                  +----------+--------+--------+--------+-------------------+ +---------+--------+--+--------+--+---------+ VertebralPSV cm/s86EDV cm/s19Antegrade +---------+--------+--+--------+--+---------+   Summary: Right Carotid: Velocities in the right ICA are consistent with a 1-39% stenosis. Left Carotid: Velocities in the left ICA are consistent with a 40-59% stenosis. Vertebrals:  Bilateral vertebral arteries demonstrate antegrade flow. Subclavians: Right subclavian artery was stenotic. Normal flow hemodynamics were              seen in the left subclavian artery.               Note: Calcific plaque may obscure higher velocity. *See table(s) above for measurements and observations.  Electronically signed by Carolynn Sayers on 11/05/2023 at 1:46:09 PM.    Final     Microbiology: Results for orders placed or performed during the hospital encounter of 11/09/23  Resp panel by RT-PCR (RSV, Flu A&B, Covid) Anterior Nasal Swab     Status: None   Collection Time: 11/09/23  8:44 PM   Specimen: Anterior Nasal Swab  Result Value Ref Range Status   SARS Coronavirus 2 by RT PCR NEGATIVE NEGATIVE Final    Comment: (NOTE) SARS-CoV-2 target nucleic acids are NOT DETECTED.  The SARS-CoV-2 RNA is generally detectable in upper respiratory specimens during the acute phase of infection. The lowest concentration of SARS-CoV-2 viral copies this assay can detect is 138 copies/mL. A negative result does not preclude SARS-Cov-2 infection and should not be used as the sole basis for treatment or other patient management decisions. A negative result may occur with  improper specimen collection/handling, submission of specimen other than nasopharyngeal swab, presence of viral mutation(s) within the areas  targeted by this assay, and inadequate number of viral copies(<138 copies/mL). A negative result must be combined with clinical observations, patient history, and epidemiological information. The expected result is  Negative.  Fact Sheet for Patients:  BloggerCourse.com  Fact Sheet for Healthcare Providers:  SeriousBroker.it  This test is no t yet approved or cleared by the [place] FDA and  has been authorized for detection and/or diagnosis of SARS-CoV-2 by FDA under an Emergency Use Authorization (EUA). This EUA will remain  in effect (meaning this test can be used) for the duration of the COVID-19 declaration under Section 564(b)(1) of the Act, 21 U.S.C.section 360bbb-3(b)(1), unless the authorization is terminated  or revoked sooner.       Influenza A by PCR NEGATIVE NEGATIVE Final   Influenza B by PCR NEGATIVE NEGATIVE Final    Comment: (NOTE) The Xpert Xpress SARS-CoV-2/FLU/RSV plus assay is intended as an aid in the diagnosis of influenza from Nasopharyngeal swab specimens and should not be used as a sole basis for treatment. Nasal washings and aspirates are unacceptable for Xpert Xpress SARS-CoV-2/FLU/RSV testing.  Fact Sheet for Patients: BloggerCourse.com  Fact Sheet for Healthcare Providers: SeriousBroker.it  This test is not yet approved or cleared by the [place] FDA and has been authorized for detection and/or diagnosis of SARS-CoV-2 by FDA under an Emergency Use Authorization (EUA). This EUA will remain in effect (meaning this test can be used) for the duration of the COVID-19 declaration under Section 564(b)(1) of the Act, 21 U.S.C. section 360bbb-3(b)(1), unless the authorization is terminated or revoked.     Resp Syncytial Virus by PCR NEGATIVE NEGATIVE Final    Comment: (NOTE) Fact Sheet for Patients: BloggerCourse.com  Fact Sheet for Healthcare Providers: SeriousBroker.it  This test is not yet approved or cleared by the [place] FDA and has been authorized for detection and/or diagnosis of  SARS-CoV-2 by FDA under an Emergency Use Authorization (EUA). This EUA will remain in effect (meaning this test can be used) for the duration of the COVID-19 declaration under Section 564(b)(1) of the Act, 21 U.S.C. section 360bbb-3(b)(1), unless the authorization is terminated or revoked.  Performed at [facility], [address] [place], [place] [address]     Labs: CBC: Recent Labs  Lab 11/09/23 1707 11/11/23 0617 11/12/23 0516  WBC 6.1 6.2 6.4  HGB 11.4* 11.3* 11.9*  HCT 36.8 35.7* 37.4  MCV 85.2 82.8 81.8  PLT 256 251 274   Basic Metabolic Panel: Recent Labs  Lab 11/09/23 1707 11/10/23 0327 11/11/23 0617 11/12/23 0516  NA 138 136 137 135  K 4.4 3.9 3.7 3.4*  CL 103 102 98 92*  CO2 24 22 32 30  GLUCOSE 95 92 103* 98  BUN 16 15 23  24*  CREATININE 0.72 0.78 0.95 0.86  CALCIUM 9.0 8.3* 8.9 9.2  MG  --  2.2  --  2.1  PHOS  --   --   --  4.2   Liver Function Tests: No results for input(s): "AST", "ALT", "ALKPHOS", "BILITOT", "PROT", "ALBUMIN" in the last 168 hours. CBG: No results for input(s): "GLUCAP" in the last 168 hours.  Discharge time spent: greater than 30 minutes.  Signed: Delfino Lovett, MD [facility] 11/13/2023

## 2023-11-15 ENCOUNTER — Telehealth: Payer: Self-pay

## 2023-11-15 ENCOUNTER — Telehealth: Payer: Self-pay | Admitting: Family Medicine

## 2023-11-15 NOTE — Telephone Encounter (Signed)
 Copied from CRM [id number]. Topic: General - Other >> Nov 15, 2023 10:53 AM Lorin Glass B wrote: Reason for CRM: Patients husband Catherine Green called in stating that his wife Catherine Green was discharged from a hospital visit that was initiated by Dr. Para March last week due to oxygen levels. Stated that they were sent home with 2 oxygen tanks but have not used or needed them. Says [facility] was supposed to deliver 2 more oxygen tanks to their home, but husband is adamant about them not being needed and does not want them delivered. States that Darothy's levels have been great since the hospital. When he called to have the delivery stopped and the other 2 tanks picked up, he was told that the patients doctor had to call and cancel the order.  [facility] [phone] Clovis Riley [phone]

## 2023-11-15 NOTE — Telephone Encounter (Signed)
 Closing encounter. Per referral patient declined pallative care assistance.

## 2023-11-15 NOTE — Telephone Encounter (Signed)
 If they are adamant about cancelling the O2, can they at least keep 2 tanks there in the meantime in case she needs them?  At least until the OV here?  Thanks.

## 2023-11-15 NOTE — Telephone Encounter (Signed)
 Do you want them to keep one or cancel all? Please advise

## 2023-11-15 NOTE — Transitions of Care (Post Inpatient/ED Visit) (Signed)
 11/15/2023  Name: Catherine Green MRN: [id number] DOB: 03-Sep-1940  Today's TOC FU Call Status: Today's TOC FU Call Status:: Successful TOC FU Call Completed TOC FU Call Complete Date: 11/15/23 Patient's Name and Date of Birth confirmed.  Transition Care Management Follow-up Telephone Call Date of Discharge: 11/12/23 Discharge Facility: [facility]) Type of Discharge: Inpatient Admission Primary Inpatient Discharge Diagnosis:: CHF How have you been since you were released from the hospital?: Better Any questions or concerns?: No  Items Reviewed: Did you receive and understand the discharge instructions provided?: Yes Medications obtained,verified, and reconciled?: Yes (Medications Reviewed) Any new allergies since your discharge?: No Dietary orders reviewed?: Yes Type of Diet Ordered:: Low Sodium Heart Healthy Do you have support at home?: Yes People in Home: spouse Name of Support/Comfort Primary Source: Fayrene Fearing  Medications Reviewed Today: Medications Reviewed Today     Reviewed by Redge Gainer, RN (Case Manager) on 11/15/23 at 1206  Med List Status: <None>   Medication Order Taking? Sig Documenting Provider Last Dose Status Informant  albuterol (VENTOLIN HFA) 108 (90 Base) MCG/ACT inhaler [id number] No Inhale 2 puffs into the lungs every 6 (six) hours as needed for wheezing or shortness of breath. Joaquim Nam, MD Taking Active Self  amLODipine (NORVASC) 2.5 MG tablet [id number] No Take 0.5 tablets (1.25 mg total) by mouth in the morning. Sondra Barges, PA-C 11/09/2023 Active Self  aspirin 81 MG tablet [id number] No Take 1 tablet (81 mg total) by mouth daily. Antonieta Iba, MD 11/09/2023 Active Self  Coenzyme Q10 (CO Q10) 100 MG CAPS [id number] No Take 100 mg by mouth daily. [provider] Past Month Active Self  doxycycline (ADOXA) 100 MG tablet [id number]  Take 1 tablet (100 mg total) by mouth 2 (two) times daily for 7 days. Delfino Lovett, MD  Active   ezetimibe (ZETIA) 10 MG tablet [id number] No Take 1 tablet (10 mg total) by mouth daily. Sondra Barges, PA-C 11/09/2023 Active Self  fluticasone furoate-vilanterol (BREO ELLIPTA) 100-25 MCG/ACT AEPB [id number]  Inhale 1 puff into the lungs daily. Delfino Lovett, MD  Active   furosemide (LASIX) 40 MG tablet [id number]  Take 1 tablet (40 mg total) by mouth daily. Delfino Lovett, MD  Active   gabapentin (NEURONTIN) 400 MG capsule [id number]  Take 1 capsule (400 mg total) by mouth 3 (three) times daily. Delfino Lovett, MD  Active   ipratropium (ATROVENT) 0.03 % nasal spray [id number] No  [provider] 11/09/2023 Active Self  loratadine (CLARITIN) 10 MG tablet [id number] No Take 10 mg by mouth daily. [provider] 11/09/2023 Active Self  metoprolol succinate (TOPROL-XL) 25 MG 24 hr tablet [id number] No TAKE 1/2 TABLET AT BEDTIME Sondra Barges, PA-C 11/08/2023 Active Self  omeprazole (PRILOSEC) 20 MG capsule [id number] No Take 1 capsule (20 mg total) by mouth 2 (two) times daily before a meal. Joaquim Nam, MD 11/09/2023 Active Self  polyethylene glycol powder (GLYCOLAX/MIRALAX) 17 GM/SCOOP powder [id number] No Take 17 g by mouth 2 (two) times daily as needed. Joaquim Nam, MD Taking Active Self  potassium chloride SA (KLOR-CON M) 20 MEQ tablet [id number]  Take 2 tablets (40 mEq total) by mouth daily. Delfino Lovett, MD  Active   rosuvastatin (CRESTOR) 20 MG tablet [id number] No Take 1 tablet (20 mg total) by mouth daily. Overdue follow up visit. PLEASE CALL OFFICE TO SCHEDULE APPOINTMENT PRIOR TO NEXT REFILL Antonieta Iba, MD 11/09/2023 Active Self  sertraline (ZOLOFT) 100 MG  tablet [id number] No TAKE ONE AND A HALF TABLETS BY MOUTH EVERY DAY Joaquim Nam, MD 11/09/2023 Active Self  vitamin B-12 (CYANOCOBALAMIN) 1000 MCG tablet [id number] No Take 1 tablet (1,000 mcg total) by mouth daily. Joaquim Nam, MD 11/09/2023 Active Self            Home Care and  Equipment/Supplies: Were Home Health Services Ordered?: NA Any new equipment or medical supplies ordered?: Yes Name of Medical supply agency?: [facility] Were you able to get the equipment/medical supplies?: Yes Do you have any questions related to the use of the equipment/supplies?: No (Oxygen - wants it returned. Doesn't need it)  Functional Questionnaire: Do you need assistance with bathing/showering or dressing?: No Do you need assistance with meal preparation?: No Do you need assistance with eating?: No Do you have difficulty maintaining continence: No Do you need assistance with getting out of bed/getting out of a chair/moving?: No Do you have difficulty managing or taking your medications?: No  Follow up appointments reviewed: PCP Follow-up appointment confirmed?: Yes Date of PCP follow-up appointment?: 11/19/23 Follow-up Provider: Crawford Givens Specialist [facility] Follow-up appointment confirmed?: Yes Date of Specialist follow-up appointment?: 11/18/23 Follow-Up Specialty Provider:: Clarisa Kindred Do you need transportation to your follow-up appointment?: No Do you understand care options if your condition(s) worsen?: Yes-patient verbalized understanding  SDOH Interventions Today    Flowsheet Row Most Recent Value  SDOH Interventions   Food Insecurity Interventions Intervention Not Indicated  Housing Interventions Intervention Not Indicated  Transportation Interventions Intervention Not Indicated  Utilities Interventions Intervention Not Indicated      Interventions Today    Flowsheet Row Most Recent Value  Chronic Disease   Chronic disease during today's visit Congestive Heart Failure (CHF)  General Interventions   General Interventions Discussed/Reviewed General Interventions Discussed, General Interventions Reviewed, Durable Medical Equipment (DME)  Durable Medical Equipment (DME) Oxygen  Exercise Interventions   Exercise Discussed/Reviewed Physical Activity   Physical Activity Discussed/Reviewed Physical Activity Reviewed  Education Interventions   Education Provided Provided Education  Provided Verbal Education On When to see the doctor, Insurance Plans  Pharmacy Interventions   Pharmacy Dicussed/Reviewed Medications and their functions       Deidre Ala, BSN, RN [place]  VBCI - Population Health RN Care Manager [phone]

## 2023-11-16 NOTE — Telephone Encounter (Signed)
 Called and spoke to husband. They have agreed to not make any changes until seen in office. They have reached out to supplier and informed as well.

## 2023-11-16 NOTE — Telephone Encounter (Signed)
 Thanks

## 2023-11-17 ENCOUNTER — Telehealth: Payer: Self-pay | Admitting: Family

## 2023-11-17 NOTE — Telephone Encounter (Signed)
 Pt confirmed appt for 11/18/23.

## 2023-11-17 NOTE — Progress Notes (Unsigned)
 Advanced Heart Failure Clinic Note   Referring Physician: recent admission PCP: Catherine Nam, MD (last seen 03/25) Cardiologist: Catherine Nordmann, MD / Catherine Listen, PA (last seen 04/24)  Chief Complaint: fatigue  HPI:  [name] is a 83 y/o female with a history of HTN, CAD s/p 3 vessel CABG in 2006, pHTN, hyperlipidemia, COPD, atrial fibrillation, PAD, tobacco use, atrial tachycardia, carotid artery disease s/p bilateral CEA and chronic heart failure. Cardiac cath from 09/2007 demonstrated an atretic LIMA to LAD with patent vein grafts. EF in 01/23 was 60-65% with grade 2 diastolic dysfunction.  Admitted 11/09/23 with shortness of breath. Cardiology consulted and initially needed oxygen. Echocardiogram repeated on this admission shows EF of 60 to 65%. Diuresed with lasix and net loss of 2600 ml. SOB felt to be more COPD than HF. Home with palliative care.    She presents for her initial HF visit with a chief complaint of moderate fatigue. Has occasional palpitations, dizziness and leg weakness along with this. Sleeping well on 2 pillows. Denies chest pain, cough, abdominal distention, pedal edema or weight gain.   Weighing daily. NAS & is looking at food labels. Denies tobacco, etoh, drug use.    Review of Systems: [y] = yes, [ ]  = no   General: Weight gain [ ] ; Weight loss [ ] ; Anorexia [ ] ; Fatigue Cove.Etienne ]; Fever [ ] ; Chills [ ] ; Weakness [ ]   Cardiac: Chest pain/pressure [ ] ; Resting SOB [ ] ; Exertional SOB [ ] ; Orthopnea [ ] ; Pedal Edema [ ] ; Palpitations Cove.Etienne ]; Syncope [ ] ; Presyncope [ ] ; Paroxysmal nocturnal dyspnea[ ]   Pulmonary: Cough [ ] ; Wheezing[ ] ; Hemoptysis[ ] ; Sputum [ ] ; Snoring [ ]   GI: Vomiting[ ] ; Dysphagia[ ] ; Melena[ ] ; Hematochezia [ ] ; Heartburn[ ] ; Abdominal pain [ ] ; Constipation [ ] ; Diarrhea [ ] ; BRBPR [ ]   GU: Hematuria[ ] ; Dysuria [ ] ; Nocturia[ ]   Vascular: Leg weakness Cove.Etienne ]; Pain in feet with lying flat [ ] ; Non-healing sores [ ] ; Stroke [ ] ; TIA [ ] ; Slurred  speech [ ] ;  Neuro: Dizziness Cove.Etienne ]; Vertigo[ ] ; Seizures[ ] ; Paresthesias[ ] ;Blurred vision [ ] ; Diplopia [ ] ; Vision changes [ ]   Ortho/Skin: Arthritis [ ] ; Joint pain [ ] ; Muscle pain [ ] ; Joint swelling [ ] ; Back Pain [ ] ; Rash [ ]   Psych: Depression[ ] ; Anxiety[ ]   Heme: Bleeding problems [ ] ; Clotting disorders [ ] ; Anemia [ ]   Endocrine: Diabetes [ ] ; Thyroid dysfunction[ ]    Past Medical History:  Diagnosis Date   Allergy    hay fever   Anemia    Anxiety    Back pain    Carotid artery occlusion    Cerebrovascular disease    extracranial; occlusive   Chicken pox    Coronary artery disease    Depression    Dizziness    Dizziness    DVT (deep venous thrombosis) (HCC)    Fainting spell    GERD (gastroesophageal reflux disease)    Headache    Heart murmur    Hyperlipidemia    Hypertension    Leg pain    Mitral regurgitation    PONV (postoperative nausea and vomiting)    severe nausea and vomiting   Pre-syncope    PVD (peripheral vascular disease) (HCC)    endarterectomy by Dr. Arbie Cookey   Seasonal allergies    Shortness of breath dyspnea    wth ambulation at times   Swelling of both ankles  and abdomen; takes Lasix when needed   Thrombophlebitis    following childbirth   Ulcer     Current Outpatient Medications  Medication Sig Dispense Refill   albuterol (VENTOLIN HFA) 108 (90 Base) MCG/ACT inhaler Inhale 2 puffs into the lungs every 6 (six) hours as needed for wheezing or shortness of breath. 8 g 3   amLODipine (NORVASC) 2.5 MG tablet Take 0.5 tablets (1.25 mg total) by mouth in the morning. 45 tablet 3   aspirin 81 MG tablet Take 1 tablet (81 mg total) by mouth daily. 30 tablet    Coenzyme Q10 (CO Q10) 100 MG CAPS Take 100 mg by mouth daily.     doxycycline (ADOXA) 100 MG tablet Take 1 tablet (100 mg total) by mouth 2 (two) times daily for 7 days. 14 tablet 0   ezetimibe (ZETIA) 10 MG tablet Take 1 tablet (10 mg total) by mouth daily. 90 tablet 2   fluticasone  furoate-vilanterol (BREO ELLIPTA) 100-25 MCG/ACT AEPB Inhale 1 puff into the lungs daily. 60 each 0   furosemide (LASIX) 40 MG tablet Take 1 tablet (40 mg total) by mouth daily. 30 tablet 2   gabapentin (NEURONTIN) 400 MG capsule Take 1 capsule (400 mg total) by mouth 3 (three) times daily.     ipratropium (ATROVENT) 0.03 % nasal spray      loratadine (CLARITIN) 10 MG tablet Take 10 mg by mouth daily.     metoprolol succinate (TOPROL-XL) 25 MG 24 hr tablet TAKE 1/2 TABLET AT BEDTIME 45 tablet 0   omeprazole (PRILOSEC) 20 MG capsule Take 1 capsule (20 mg total) by mouth 2 (two) times daily before a meal.     polyethylene glycol powder (GLYCOLAX/MIRALAX) 17 GM/SCOOP powder Take 17 g by mouth 2 (two) times daily as needed. 850 g 0   potassium chloride SA (KLOR-CON M) 20 MEQ tablet Take 2 tablets (40 mEq total) by mouth daily. 30 tablet 2   rosuvastatin (CRESTOR) 20 MG tablet Take 1 tablet (20 mg total) by mouth daily. Overdue follow up visit. PLEASE CALL OFFICE TO SCHEDULE APPOINTMENT PRIOR TO NEXT REFILL 90 tablet 0   sertraline (ZOLOFT) 100 MG tablet TAKE ONE AND A HALF TABLETS BY MOUTH EVERY DAY 150 tablet 2   vitamin B-12 (CYANOCOBALAMIN) 1000 MCG tablet Take 1 tablet (1,000 mcg total) by mouth daily.     No current facility-administered medications for this visit.    Allergies  Allergen Reactions   Lyrica [Pregabalin] Shortness Of Breath, Swelling and Other (See Comments)    chest tight   Hydrocodone Other (See Comments)    hallucinate   Ivp Dye [Iodinated Contrast Media] Hives and Itching        Lipitor [Atorvastatin] Nausea And Vomiting   Methylprednisolone Other (See Comments)    Severe weekness    Oxycodone Hcl Other (See Comments)    Hallucinations    Penicillin G Nausea And Vomiting   Shellfish Allergy Hives and Itching   Buspar [Buspirone]     Worsening mood   Clavulanic Acid    Prednisone    Codeine Nausea And Vomiting and Other (See Comments)   Cymbalta [Duloxetine Hcl]  Nausea Only    GI upset at 30mg    Demerol Nausea And Vomiting   Doxycycline Nausea Only and Other (See Comments)    Weakness, sick to her stomach   Effexor [Venlafaxine] Nausea Only   Hydrochlorothiazide Other (See Comments)    Not an allergy but urinary frequency and sweating at 25mg   Hydroxyzine Other (See Comments)    Excessive sweating.    Metoprolol Other (See Comments)    Slowed body down, per pt    Protonix [Pantoprazole Sodium] Nausea Only   Trazodone And Nefazodone Other (See Comments)    Sedation       Social History   Socioeconomic History   Marital status: Married    Spouse name: Fayrene Fearing    Number of children: 1   Years of education: Not on file   Highest education level: 11th grade  Occupational History    Comment: Retired    Occupation: Retired  Tobacco Use   Smoking status: Former    Current packs/day: 0.00    Average packs/day: 0.3 packs/day for 15.0 years (4.5 ttl pk-yrs)    Types: Cigarettes    Start date: 08/31/1981    Quit date: 08/31/1996    Years since quitting: 27.2    Passive exposure: Never   Smokeless tobacco: Never  Vaping Use   Vaping status: Never Used  Substance and Sexual Activity   Alcohol use: No    Alcohol/week: 0.0 standard drinks of alcohol   Drug use: No   Sexual activity: Not Currently  Other Topics Concern   Not on file  Social History Narrative   Lives with husband in a one level    Work - retired Producer, television/film/video   Diet - healthy   Right handed    Caffeine- 1 cup per day.      Had 1 daughter. Daughter is deceased   Social Drivers of Corporate investment banker Strain: Low Risk  (11/12/2023)   Overall Financial Resource Strain (CARDIA)    Difficulty of Paying Living Expenses: Not hard at all  Food Insecurity: No Food Insecurity (11/15/2023)   Hunger Vital Sign    Worried About Running Out of Food in the Last Year: Never true    Ran Out of Food in the Last Year: Never true  Transportation Needs: No Transportation Needs  (11/15/2023)   PRAPARE - Administrator, Civil Service (Medical): No    Lack of Transportation (Non-Medical): No  Physical Activity: Sufficiently Active (05/25/2023)   Exercise Vital Sign    Days of Exercise per Week: 5 days    Minutes of Exercise per Session: 30 min  Stress: No Stress Concern Present (05/25/2023)   [employer] of Occupational Health - Occupational Stress Questionnaire    Feeling of Stress : Not at all  Social Connections: Moderately Integrated (11/11/2023)   Social Connection and Isolation Panel [NHANES]    Frequency of Communication with Friends and Family: More than three times a week    Frequency of Social Gatherings with Friends and Family: More than three times a week    Attends Religious Services: 1 to 4 times per year    Active Member of [employer] or Organizations: No    Attends Banker Meetings: Never    Marital Status: Married  Catering manager Violence: Not At Risk (11/15/2023)   Humiliation, Afraid, Rape, and Kick questionnaire    Fear of Current or Ex-Partner: No    Emotionally Abused: No    Physically Abused: No    Sexually Abused: No      Family History  Problem Relation Age of Onset   Aneurysm Mother        brain   Heart disease Mother        Aneyursm    Hyperlipidemia Mother    Hypertension Mother  Varicose Veins Mother    Bleeding Disorder Mother    Heart disease Father    Cirrhosis Father    Heart attack Father    Hyperlipidemia Father    Hypertension Father    Cancer Sister        lung   Heart disease Sister        Aneurysm   Hyperlipidemia Sister    Hypertension Sister    Varicose Veins Sister    Bleeding Disorder Sister    Heart disease Brother        Before age 30   Aneurysm Brother    Deep vein thrombosis Brother    Birth defects Brother    Hyperlipidemia Brother    Hypertension Brother    Peripheral vascular disease Daughter    Hyperlipidemia Daughter    Hypertension Daughter    Hypertension  Other    Breast cancer Neg Hx    Colon cancer Neg Hx    Vitals:   11/18/23 1024  BP: (!) 148/81  Pulse: 70  SpO2: 95%  Weight: 132 lb 9.6 oz (60.1 kg)   Wt Readings from Last 3 Encounters:  11/18/23 132 lb 9.6 oz (60.1 kg)  11/12/23 134 lb 7.7 oz (61 kg)  11/09/23 142 lb 3.2 oz (64.5 kg)   Lab Results  Component Value Date   CREATININE 0.86 11/12/2023   CREATININE 0.95 11/11/2023   CREATININE 0.78 11/10/2023   PHYSICAL EXAM:  General: Well appearing. No resp difficulty HEENT: normal Neck: supple, no JVD Cor: Regular rhythm, rate. No rubs, gallops or murmurs Lungs: clear Abdomen: soft, nontender, nondistended. Extremities: no cyanosis, clubbing, rash, edema Neuro: alert & oriented X 3. Moves all 4 extremities w/o difficulty. Affect pleasant   ECG: NSR with PAC's, HR 67; (personally reviewed) previous EKG was atrial fibrllation    ASSESSMENT & PLAN:  1: Ischemic heart failure with preserved ejection fraction- - NYHA class III - euvolemic - weighing daily; reviewed parameters to call about - Echo 11/10/23: EF 60-65%, normal RV, mild MR - begin jardiance 10mg  daily; 30 day voucher given - BMET next visit - continue furosemide 40mg  daily/ potassium daily - could consider adding MRA next visit with stoppage of amlodipine - BNP 11/09/23 was 204.2  2: HTN- - BP 148/81; reports home SBP in the 120's - continue amlodipine 1.25mg  daily; consider stopping if BP allows - saw PCP Para March) 03/25 - BMET 11/12/23 reviewed and showed sodium 135, potassium 3.4, creatinine 0.86 & GFR >60  3: CAD- - saw cardiology Mariah Milling) 04/24 - 3 vessel CABG in 2006 - Cardiac cath from 09/2007 demonstrated an atretic LIMA to LAD with patent vein grafts. - continue ASA 81mg  daily  4: Atrial fibrillation- - continue metoprolol succinate 12.5mg  daily - rate controlled today  5: Hyperlipidemia-  - LDL 07/17/22 was 61 - lipid panel next visit - continue ezetimibe 10mg  daily - continue  rosuvastatin 20mg  daily   Return in 3 weeks, sooner if needed  Delma Freeze, FNP 11/17/23

## 2023-11-18 ENCOUNTER — Inpatient Hospital Stay: Admitting: Family Medicine

## 2023-11-18 ENCOUNTER — Encounter: Payer: Self-pay | Admitting: Family

## 2023-11-18 ENCOUNTER — Ambulatory Visit: Attending: Family | Admitting: Family

## 2023-11-18 VITALS — BP 148/81 | HR 70 | Wt 132.6 lb

## 2023-11-18 DIAGNOSIS — I110 Hypertensive heart disease with heart failure: Secondary | ICD-10-CM | POA: Insufficient documentation

## 2023-11-18 DIAGNOSIS — Z79899 Other long term (current) drug therapy: Secondary | ICD-10-CM | POA: Insufficient documentation

## 2023-11-18 DIAGNOSIS — I491 Atrial premature depolarization: Secondary | ICD-10-CM | POA: Diagnosis not present

## 2023-11-18 DIAGNOSIS — I5032 Chronic diastolic (congestive) heart failure: Secondary | ICD-10-CM | POA: Diagnosis not present

## 2023-11-18 DIAGNOSIS — Z87891 Personal history of nicotine dependence: Secondary | ICD-10-CM | POA: Insufficient documentation

## 2023-11-18 DIAGNOSIS — Z951 Presence of aortocoronary bypass graft: Secondary | ICD-10-CM | POA: Insufficient documentation

## 2023-11-18 DIAGNOSIS — I10 Essential (primary) hypertension: Secondary | ICD-10-CM

## 2023-11-18 DIAGNOSIS — I272 Pulmonary hypertension, unspecified: Secondary | ICD-10-CM | POA: Diagnosis not present

## 2023-11-18 DIAGNOSIS — I480 Paroxysmal atrial fibrillation: Secondary | ICD-10-CM | POA: Diagnosis not present

## 2023-11-18 DIAGNOSIS — Z7951 Long term (current) use of inhaled steroids: Secondary | ICD-10-CM | POA: Diagnosis not present

## 2023-11-18 DIAGNOSIS — I25118 Atherosclerotic heart disease of native coronary artery with other forms of angina pectoris: Secondary | ICD-10-CM

## 2023-11-18 DIAGNOSIS — I2510 Atherosclerotic heart disease of native coronary artery without angina pectoris: Secondary | ICD-10-CM | POA: Insufficient documentation

## 2023-11-18 DIAGNOSIS — I739 Peripheral vascular disease, unspecified: Secondary | ICD-10-CM | POA: Diagnosis not present

## 2023-11-18 DIAGNOSIS — J449 Chronic obstructive pulmonary disease, unspecified: Secondary | ICD-10-CM | POA: Diagnosis not present

## 2023-11-18 DIAGNOSIS — Z7982 Long term (current) use of aspirin: Secondary | ICD-10-CM | POA: Diagnosis not present

## 2023-11-18 DIAGNOSIS — E785 Hyperlipidemia, unspecified: Secondary | ICD-10-CM | POA: Diagnosis not present

## 2023-11-18 MED ORDER — EMPAGLIFLOZIN 10 MG PO TABS: 10.0000 mg | ORAL_TABLET | Freq: Every day | ORAL | 3 refills | Status: DC

## 2023-11-18 NOTE — Progress Notes (Signed)
 [facility] - HEART FAILURE CLINIC - PHARMACIST COUNSELING NOTE  Guideline-Directed Medical Therapy/Evidence Based Medicine  ACE/ARB/ARNI: {AR_ARNI/ACEi/ARB:25599} Beta Blocker: {AR_Beta blocker:25598} Aldosterone Antagonist: {AR_Mineralocorticoid receptor antagonists:25602} Diuretic: {AR_Diuretics:25604} SGLT2i: {AR_SGLT2i:25603}  Adherence Assessment  Do you ever forget to take your medication? [] Yes [x] No  Do you ever skip doses due to side effects? [] Yes [x] No  Do you have trouble affording your medicines? [] Yes [x] No  Are you ever unable to pick up your medication due to transportation difficulties? [] Yes [x] No  Do you ever stop taking your medications because you don't believe they are helping? [] Yes [x] No  Do you check your weight daily? [] Yes [x] No   Adherence strategy: Pill box  Barriers to obtaining medications: None reported   Vital signs: HR ***, BP ***, weight (pounds) *** ECHO: Date ***, EF ***, notes *** Cath: Date ***, EF ***, notes ***     Latest Ref Rng & Units 11/12/2023    5:16 AM 11/11/2023    6:17 AM 11/10/2023    3:27 AM  BMP  Glucose 70 - 99 mg/dL 98  161  92   BUN 8 - 23 mg/dL 24  23  15    Creatinine 0.44 - 1.00 mg/dL 0.96  0.45  4.09   Sodium 135 - 145 mmol/L 135  137  136   Potassium 3.5 - 5.1 mmol/L 3.4  3.7  3.9   Chloride 98 - 111 mmol/L 92  98  102   CO2 22 - 32 mmol/L 30  32  22   Calcium 8.9 - 10.3 mg/dL 9.2  8.9  8.3     Past Medical History:  Diagnosis Date   Allergy    hay fever   Anemia    Anxiety    Back pain    Carotid artery occlusion    Cerebrovascular disease    extracranial; occlusive   Chicken pox    Coronary artery disease    Depression    Dizziness    Dizziness    DVT (deep venous thrombosis) (HCC)    Fainting spell    GERD (gastroesophageal reflux disease)    Headache    Heart murmur    Hyperlipidemia    Hypertension    Leg pain    Mitral regurgitation    PONV (postoperative nausea  and vomiting)    severe nausea and vomiting   Pre-syncope    PVD (peripheral vascular disease) (HCC)    endarterectomy by Dr. Arbie Cookey   Seasonal allergies    Shortness of breath dyspnea    wth ambulation at times   Swelling of both ankles    and abdomen; takes Lasix when needed   Thrombophlebitis    following childbirth   Ulcer     ASSESSMENT *** year old {Gender Description:[id number]} who presents to the HF clinic ***  Recent ED Visit (past 6 months): Date - ***, CC - ***  PLAN     Time spent: *** minutes  Littie Deeds, PharmD Pharmacy Resident  11/18/2023 9:06 AM  Current Outpatient Medications:    albuterol (VENTOLIN HFA) 108 (90 Base) MCG/ACT inhaler, Inhale 2 puffs into the lungs every 6 (six) hours as needed for wheezing or shortness of breath., Disp: 8 g, Rfl: 3   amLODipine (NORVASC) 2.5 MG tablet, Take 0.5 tablets (1.25 mg total) by mouth in the morning., Disp: 45 tablet, Rfl: 3   aspirin 81 MG tablet, Take 1 tablet (81 mg total) by mouth daily., Disp: 30 tablet, Rfl:  Coenzyme Q10 (CO Q10) 100 MG CAPS, Take 100 mg by mouth daily., Disp: , Rfl:    doxycycline (ADOXA) 100 MG tablet, Take 1 tablet (100 mg total) by mouth 2 (two) times daily for 7 days., Disp: 14 tablet, Rfl: 0   ezetimibe (ZETIA) 10 MG tablet, Take 1 tablet (10 mg total) by mouth daily., Disp: 90 tablet, Rfl: 2   fluticasone furoate-vilanterol (BREO ELLIPTA) 100-25 MCG/ACT AEPB, Inhale 1 puff into the lungs daily., Disp: 60 each, Rfl: 0   furosemide (LASIX) 40 MG tablet, Take 1 tablet (40 mg total) by mouth daily., Disp: 30 tablet, Rfl: 2   gabapentin (NEURONTIN) 400 MG capsule, Take 1 capsule (400 mg total) by mouth 3 (three) times daily., Disp: , Rfl:    ipratropium (ATROVENT) 0.03 % nasal spray, , Disp: , Rfl:    loratadine (CLARITIN) 10 MG tablet, Take 10 mg by mouth daily., Disp: , Rfl:    metoprolol succinate (TOPROL-XL) 25 MG 24 hr tablet, TAKE 1/2 TABLET AT BEDTIME, Disp: 45 tablet, Rfl: 0    omeprazole (PRILOSEC) 20 MG capsule, Take 1 capsule (20 mg total) by mouth 2 (two) times daily before a meal., Disp: , Rfl:    polyethylene glycol powder (GLYCOLAX/MIRALAX) 17 GM/SCOOP powder, Take 17 g by mouth 2 (two) times daily as needed., Disp: 850 g, Rfl: 0   potassium chloride SA (KLOR-CON M) 20 MEQ tablet, Take 2 tablets (40 mEq total) by mouth daily., Disp: 30 tablet, Rfl: 2   rosuvastatin (CRESTOR) 20 MG tablet, Take 1 tablet (20 mg total) by mouth daily. Overdue follow up visit. PLEASE CALL OFFICE TO SCHEDULE APPOINTMENT PRIOR TO NEXT REFILL, Disp: 90 tablet, Rfl: 0   sertraline (ZOLOFT) 100 MG tablet, TAKE ONE AND A HALF TABLETS BY MOUTH EVERY DAY, Disp: 150 tablet, Rfl: 2   vitamin B-12 (CYANOCOBALAMIN) 1000 MCG tablet, Take 1 tablet (1,000 mcg total) by mouth daily., Disp: , Rfl:   COUNSELING POINTS/CLINICAL PEARLS (*** ENTER DOT PHRASE "HFCMEDCOUNSELING" IF NEEDED, OTHERWISE DELETE HEADER***)

## 2023-11-18 NOTE — Patient Instructions (Signed)
 Medication Changes:  START Jardiance 10mg  (1 tab) daily  Follow-Up in: Please follow up with the [facility] in 3 weeks.   At the [facility], you and your health needs are our priority. We have a designated team specialized in the treatment of Heart Failure. This Care Team includes your primary Heart Failure Specialized Cardiologist (physician), Advanced Practice Providers (APPs- Physician Assistants and Nurse Practitioners), and Pharmacist who all work together to provide you with the care you need, when you need it.   You may see any of the following providers on your designated Care Team at your next follow up:  Dr. Arvilla Meres Dr. Marca Ancona Dr. Dorthula Nettles Dr. Theresia Bough Clarisa Kindred, FNP Enos Fling, RPH-CPP  Please be sure to bring in all your medications bottles to every appointment.   Need to Contact us:  If you have any questions or concerns before your next appointment please send [place] a message through [place] or call our office at [phone].    TO LEAVE A MESSAGE FOR THE NURSE SELECT OPTION 2, PLEASE LEAVE A MESSAGE INCLUDING: YOUR NAME DATE OF BIRTH CALL BACK NUMBER REASON FOR CALL**this is important as we prioritize the call backs  YOU WILL RECEIVE A CALL BACK THE SAME DAY AS LONG AS YOU CALL BEFORE 4:00 PM

## 2023-11-19 ENCOUNTER — Encounter: Payer: Self-pay | Admitting: Family Medicine

## 2023-11-19 ENCOUNTER — Telehealth: Payer: Self-pay | Admitting: Family Medicine

## 2023-11-19 ENCOUNTER — Ambulatory Visit (INDEPENDENT_AMBULATORY_CARE_PROVIDER_SITE_OTHER): Admitting: Family Medicine

## 2023-11-19 VITALS — BP 124/64 | HR 59 | Temp 99.1°F | Ht 62.0 in | Wt 134.2 lb

## 2023-11-19 DIAGNOSIS — I5033 Acute on chronic diastolic (congestive) heart failure: Secondary | ICD-10-CM

## 2023-11-19 DIAGNOSIS — I509 Heart failure, unspecified: Secondary | ICD-10-CM | POA: Diagnosis not present

## 2023-11-19 NOTE — Patient Instructions (Addendum)
 Go to the lab on the way out.   If you have mychart we'll likely use that to update you.     Take care.  Glad to see you.  You should get a call about jardiance in about 1 week.   Update [place] as needed especially if your are more short or breath or if your weight is going up.

## 2023-11-19 NOTE — Telephone Encounter (Signed)
 Please send an order to [employer] about ceasing home O2.

## 2023-11-19 NOTE — Progress Notes (Signed)
 Inpatient course discussed with patient.  She was progressively short of breath and required hospitalization.  Discharge Diagnoses: Active Problems:   Acute on chronic congestive heart failure (HCC)   Acute hypoxic respiratory failure (HCC)   Essential hypertension   CAD, ARTERY BYPASS GRAFT   Chronic obstructive pulmonary disease with acute exacerbation [facility])   Atrial fibrillation [facility])   Hospital Course: Assessment and Plan:   83 y.o. female with medical history significant of HFpEF, pulmonary hypertension, CAD s/p CABG (2009), atrial tachycardia, carotid artery disease, hypertension, hyperlipidemia, who presents to the ED due to shortness of breath    3/12: Cardiology consult, echo 3/13: Ambulation, wean o2     Acute on chronic heart failure with preserved ejection fraction (HFpEF) (HCC) history of HFpEF with last EF of 60-65% with grade 2 diastolic dysfunction, last evaluated in January 2023.    - echocardiogram repeated on this admission shows EF of 60 to 65%.  Cardiology seen and felt this more from underlying lung disease than heart failure -Diuresed with Lasix while in the hospital with good response Net IO Since Admission: -2,600 mL [11/13/23 1448]    Acute hypoxic respiratory failure (HCC) In the setting of pulmonary edema and over COPD exacerbation -Improved with diuresis and steroid inhaler - Will need 2 L oxygen at discharge.  This has been set up by TOC   CAD, ARTERY BYPASS GRAFT No reported chest pain at this time.   - Continue home aspirin, statin, Zetia   Essential hypertension - Continue home amlodipine, metoprolol for now  ============================= She has been off O2 in the meantime.  She got her O2 tanks/equipement removed today.  She needs order sent for O2 cessation.  Discussed with patient.  She has 1 pill of doxy left.  She was vomiting after taking doxy.  D/w pt about cessation since she was not tolerating the medication well and only had 1  pill left.  [name] is going to be expensive for patient, $150 per month.  Has 1 month supply.  Needs pharmacy referral help- referral placed.  Rationale for Jardiance use discussed with patient.  Meds, vitals, and allergies reviewed.   ROS: Per HPI unless specifically indicated in ROS section   Nad Ncat Neck supple, no LA Rrr Ctab  Abdomen soft Skin well-perfused no BLE edema.

## 2023-11-20 LAB — BASIC METABOLIC PANEL
BUN: 24 mg/dL (ref 7–25)
CO2: 29 mmol/L (ref 20–32)
Calcium: 9.5 mg/dL (ref 8.6–10.4)
Chloride: 97 mmol/L — ABNORMAL LOW (ref 98–110)
Creat: 0.83 mg/dL (ref 0.60–0.95)
Glucose, Bld: 94 mg/dL (ref 65–99)
Potassium: 4.2 mmol/L (ref 3.5–5.3)
Sodium: 139 mmol/L (ref 135–146)
eGFR: 70 mL/min/{1.73_m2} (ref >=60)

## 2023-11-21 ENCOUNTER — Encounter: Payer: Self-pay | Admitting: Family Medicine

## 2023-11-21 NOTE — Assessment & Plan Note (Signed)
 She is improved in the meantime.  Off oxygen.  Discussed continuing Jardiance.  Pharmacy referral placed.  Discussed sodium restriction and weight monitoring.  Update [place] if more short of breath or if her weight is increasing.  Continue Crestor potassium metoprolol and furosemide.  Continue Jardiance aspirin and amlodipine.  See notes on labs.  30 minutes were devoted to patient care in this encounter (this includes time spent reviewing the patient's file/history, interviewing and examining the patient, counseling/reviewing plan with patient).

## 2023-11-24 ENCOUNTER — Other Ambulatory Visit: Admitting: Pharmacist

## 2023-11-24 ENCOUNTER — Encounter: Payer: Self-pay | Admitting: Pharmacist

## 2023-11-24 DIAGNOSIS — H903 Sensorineural hearing loss, bilateral: Secondary | ICD-10-CM | POA: Diagnosis not present

## 2023-11-24 DIAGNOSIS — H6123 Impacted cerumen, bilateral: Secondary | ICD-10-CM | POA: Diagnosis not present

## 2023-11-24 DIAGNOSIS — I509 Heart failure, unspecified: Secondary | ICD-10-CM

## 2023-11-24 NOTE — Patient Instructions (Addendum)
 Ms. Catherine Green,   It was a pleasure to speak with you and [name] today! As we discussed:?  We have initiated your application for the following medication assistance program.  You may sign the application in the front office (Dr. Lianne Bushy office - [facility]) and day/time that the clinic is open.  Please bring with you a copy of both of your social security benefits and we can make a copy.  OR if you have completed taxed, this can be used as proof of income   Probation officer Patient Assistance Program (BI Cares) Medication: Jardiance Phone: [phone]  What to Expect: Once we have received all required documents and your signature, we will fax your application to the program.  The program typically reviews applications within 3 business days but may require additional time during periods of higher volume, especially during January and February. Once reviewed, the program will send you and your doctor a letter informing you of your approval or denial. If approved, [name] will be shipped to your home for no cost through the end of the calendar year. We can then re-apply for next year starting in ~October.   For any questions after your application has been submitted,  Please contact the program directly by phone. To Check your application status For Questions regarding your medication shipment or delivery details To see when your next medication refill will be shipped out To request medication refills  You may respond directly to this message, or leave me a voicemail at [phone] and I will get back to you shortly.   Loree Fee, PharmD Clinical Pharmacist [facility] [phone]

## 2023-11-24 NOTE — Progress Notes (Addendum)
 Patient Assistance Program (PAP) Application   Manufacturer: [employer] (BI Cares)    (New enrollment) Medication(s): Jardiance  Patient Portion of Application:  11/24/23: Filled out and uploaded to clinic eFax folder for patient signature in front office.   Income Documentation: Patient preference to drop off copy of income documentation at clinic front desk. SSI or Tax return.   Provider Portion of Application:  11/24/23: Completed by PharmD - PCP to sign once patient forms signed  by patient  Prescription(s): Included in MAP application.   Application Status: APPROVED THROUGH 08/30/2024  Forwarded to [facility] Patient Advocate Team for future correspondences/re-enrollment.  Note routed to PCP Clinic Pool to ensure PCP signature is obtained and application is faxed.  *[facility] clinic team - Please Addend/update this note as the "Next Steps" are completed in office*

## 2023-11-24 NOTE — Progress Notes (Signed)
   11/24/2023 Name: Catherine Green MRN: [id number] DOB: August 30, 1941  Subjective  Chief Complaint  Patient presents with   Congestive Heart Failure   Medication Access   Care Team: Primary Care Provider: Joaquim Nam, MD  Reason for visit: ?  Catherine Green is a 83 y.o. female who presents today with her husband for a telephone visit with the pharmacist due to medication access concerns regarding their Jardiance.    Medication Access: ?  Prescription drug coverage: Payor: AET[employer] MEDICARE / Plan: AETNA MEDICARE HMO/PPO / Product Type: *No Product type* / .   Current Patient Assistance:  N/A Brand Medications:  Jardiance - $150 due to drug deductible. Patient is unsure what deductible is this year.   Patient lives in a household of 2 with income via Product [employer].  Medicare LIS Eligible: No    Assessment and Plan:   1. Medication Access [employer] [facility] Cares) patient portion of application filled out today and uploaded to eFax folder for patient signature in the front office.  PAP documented in separate patient assistance encounter  Patient is not eligible for copay cards due to government insurance.  Future Appointments  Date Time Provider Department Center  12/10/2023 11:00 AM Delma Freeze, FNP [facility] None  12/20/2023 10:00 AM Antonieta Iba, MD [facility] None  01/28/2024  2:00 PM Antony Madura, MD [facility] None  05/29/2024  3:00 PM [facility] ANNUAL WELLNESS VISIT 1 [facility]   Loree Fee, PharmD Clinical Pharmacist [facility] [phone]

## 2023-11-25 ENCOUNTER — Telehealth: Payer: Self-pay | Admitting: Family Medicine

## 2023-11-25 NOTE — Telephone Encounter (Signed)
 Patient dropped off ppwk for Med assist program, had them sign and placed in providers box for signature

## 2023-11-25 NOTE — Telephone Encounter (Signed)
Placed in your box for review.  °

## 2023-11-25 NOTE — Telephone Encounter (Signed)
 I'll work on the hard copy.  Thanks.

## 2023-11-25 NOTE — Progress Notes (Signed)
 Next Steps: [x]    Application filled out and uploaded to [facility] eFax folder for patient signature in front office (plans to sign ~11/29/23) [x]    Once patient signs, front office to place application in PCP folder I have printed insurance card and attached.  []    Upon PCP signature Application to be faxed to [employer] Fax: [phone] with copy of patient's insurance card AND income document provided by patient AND scanned into patient chart

## 2023-11-28 NOTE — Progress Notes (Unsigned)
Form signed, please scan and send. Thanks.

## 2023-11-30 NOTE — Progress Notes (Signed)
 Form faxed to [facility] and placed in pharmacy folder

## 2023-12-02 ENCOUNTER — Other Ambulatory Visit: Payer: Self-pay | Admitting: Family Medicine

## 2023-12-03 ENCOUNTER — Other Ambulatory Visit: Payer: Self-pay

## 2023-12-03 MED ORDER — FUROSEMIDE 40 MG PO TABS: 40.0000 mg | ORAL_TABLET | Freq: Every day | ORAL | 5 refills | Status: DC

## 2023-12-03 MED ORDER — POTASSIUM CHLORIDE CRYS ER 20 MEQ PO TBCR: 40.0000 meq | EXTENDED_RELEASE_TABLET | Freq: Every day | ORAL | 5 refills | Status: DC

## 2023-12-03 NOTE — Telephone Encounter (Signed)
 Patient notified

## 2023-12-03 NOTE — Telephone Encounter (Signed)
 Pended refills for your approval. Both lasix and potassium ordered by hospital provider.  Pt seen for hospital f/u 11/19/23.  BMP completed potassium 4.2   Copied from CRM #[id number]. Topic: General - Other >> Dec 03, 2023  8:33 AM Catherine Green wrote: Reason for CRM: patient called stated while she was in the hospital on 3/14 and she was given potassium chloride and now she is out and she [facility]. Patient also want to know if she still has to take the furosemide 40mg   tablet if so she need [facility]

## 2023-12-03 NOTE — Telephone Encounter (Signed)
 I would continue both and I sent both rxs.  Thanks.

## 2023-12-08 ENCOUNTER — Other Ambulatory Visit: Payer: Self-pay | Admitting: Cardiovascular Disease

## 2023-12-08 ENCOUNTER — Other Ambulatory Visit: Payer: Self-pay | Admitting: Physician Assistant

## 2023-12-09 ENCOUNTER — Encounter: Admitting: Family

## 2023-12-10 ENCOUNTER — Encounter: Payer: Self-pay | Admitting: Family

## 2023-12-10 ENCOUNTER — Ambulatory Visit: Attending: Family | Admitting: Family

## 2023-12-10 VITALS — BP 138/45 | HR 64 | Wt 131.0 lb

## 2023-12-10 DIAGNOSIS — I25118 Atherosclerotic heart disease of native coronary artery with other forms of angina pectoris: Secondary | ICD-10-CM

## 2023-12-10 DIAGNOSIS — E785 Hyperlipidemia, unspecified: Secondary | ICD-10-CM | POA: Insufficient documentation

## 2023-12-10 DIAGNOSIS — I480 Paroxysmal atrial fibrillation: Secondary | ICD-10-CM | POA: Diagnosis not present

## 2023-12-10 DIAGNOSIS — I272 Pulmonary hypertension, unspecified: Secondary | ICD-10-CM | POA: Diagnosis not present

## 2023-12-10 DIAGNOSIS — I2510 Atherosclerotic heart disease of native coronary artery without angina pectoris: Secondary | ICD-10-CM | POA: Insufficient documentation

## 2023-12-10 DIAGNOSIS — I5032 Chronic diastolic (congestive) heart failure: Secondary | ICD-10-CM | POA: Diagnosis not present

## 2023-12-10 DIAGNOSIS — Z87891 Personal history of nicotine dependence: Secondary | ICD-10-CM | POA: Diagnosis not present

## 2023-12-10 DIAGNOSIS — Z7982 Long term (current) use of aspirin: Secondary | ICD-10-CM | POA: Diagnosis not present

## 2023-12-10 DIAGNOSIS — I10 Essential (primary) hypertension: Secondary | ICD-10-CM | POA: Diagnosis not present

## 2023-12-10 DIAGNOSIS — I110 Hypertensive heart disease with heart failure: Secondary | ICD-10-CM | POA: Diagnosis not present

## 2023-12-10 DIAGNOSIS — R531 Weakness: Secondary | ICD-10-CM | POA: Insufficient documentation

## 2023-12-10 DIAGNOSIS — I4891 Unspecified atrial fibrillation: Secondary | ICD-10-CM | POA: Insufficient documentation

## 2023-12-10 DIAGNOSIS — Z951 Presence of aortocoronary bypass graft: Secondary | ICD-10-CM | POA: Diagnosis not present

## 2023-12-10 DIAGNOSIS — Z79899 Other long term (current) drug therapy: Secondary | ICD-10-CM | POA: Diagnosis not present

## 2023-12-10 DIAGNOSIS — J449 Chronic obstructive pulmonary disease, unspecified: Secondary | ICD-10-CM | POA: Diagnosis not present

## 2023-12-10 DIAGNOSIS — Z7984 Long term (current) use of oral hypoglycemic drugs: Secondary | ICD-10-CM | POA: Insufficient documentation

## 2023-12-10 MED ORDER — EMPAGLIFLOZIN 10 MG PO TABS: 10.0000 mg | ORAL_TABLET | Freq: Every day | ORAL | Status: DC

## 2023-12-10 NOTE — Progress Notes (Signed)
 [facility] Failure Clinic Note    PCP: Joaquim Nam, MD (last seen 03/25) Cardiologist: Julien Nordmann, MD / Eula Listen, PA (last seen 04/24)  Chief Complaint: fatigue  HPI:  Catherine Green is a 83 y/o female with a history of HTN, CAD s/p 3 vessel CABG in 2006, pHTN, hyperlipidemia, COPD, atrial fibrillation, PAD, tobacco use, atrial tachycardia, carotid artery disease s/p bilateral CEA and chronic heart failure. Cardiac cath from 09/2007 demonstrated an atretic LIMA to LAD with patent vein grafts. EF in 01/23 was 60-65% with grade 2 diastolic dysfunction.  Admitted 11/09/23 with shortness of breath. Cardiology consulted and initially needed oxygen. Echocardiogram repeated on this admission shows EF of 60 to 65%. Diuresed with lasix and net loss of 2600 ml. SOB felt to be more COPD than HF. Home with palliative care.    She presents for a HF follow-up visit with a chief complaint of moderate fatigue. Has associated leg weakness where legs give out & says that this has been occurring ever since she had back surgery 05/24. Has occasional palpitations and dizziness along with this. Denies shortness of breath, chest pain, palpitations, abdominal distention, pedal edema, weight gain or difficulty sleeping.   SBP at home 120's. Has upcoming appointments with surgeon to discuss the leg weakness.   At last visit, jardiance 10mg  daily was started. Denies having any issues with the medication except that it will be too expensive. Patient has filled out patient assistance paperwork at PCP office and says that she will be receiving the medication in the mail but she's not sure when it will arrive.    ROS: All systems negative except what is listed in HPI, PMH and Problem List   Past Medical History:  Diagnosis Date   Allergy    hay fever   Anemia    Anxiety    Back pain    Carotid artery occlusion    Cerebrovascular disease    extracranial; occlusive   Chicken pox    Coronary artery  disease    Depression    Dizziness    Dizziness    DVT (deep venous thrombosis) (HCC)    Fainting spell    GERD (gastroesophageal reflux disease)    Headache    Heart murmur    Hyperlipidemia    Hypertension    Leg pain    Mitral regurgitation    PONV (postoperative nausea and vomiting)    severe nausea and vomiting   Pre-syncope    PVD (peripheral vascular disease) (HCC)    endarterectomy by Dr. Arbie Cookey   Seasonal allergies    Shortness of breath dyspnea    wth ambulation at times   Swelling of both ankles    and abdomen; takes Lasix when needed   Thrombophlebitis    following childbirth   Ulcer     Current Outpatient Medications  Medication Sig Dispense Refill   albuterol (VENTOLIN HFA) 108 (90 Base) MCG/ACT inhaler Inhale 2 puffs into the lungs every 6 (six) hours as needed for wheezing or shortness of breath. 8 g 3   amLODipine (NORVASC) 2.5 MG tablet Take 0.5 tablets (1.25 mg total) by mouth in the morning. 45 tablet 3   aspirin 81 MG tablet Take 1 tablet (81 mg total) by mouth daily. 30 tablet    Coenzyme Q10 (CO Q10) 100 MG CAPS Take 100 mg by mouth daily.     empagliflozin (JARDIANCE) 10 MG TABS tablet Take 1 tablet (10 mg total) by mouth daily before  breakfast. 30 tablet 3   ezetimibe (ZETIA) 10 MG tablet TAKE ONE TABLET (10 MG) BY MOUTH EVERY DAY 90 tablet 0   fluticasone furoate-vilanterol (BREO ELLIPTA) 100-25 MCG/ACT AEPB Inhale 1 puff into the lungs daily. 60 each 0   furosemide (LASIX) 40 MG tablet Take 1 tablet (40 mg total) by mouth daily. 30 tablet 5   gabapentin (NEURONTIN) 400 MG capsule Take 1 capsule (400 mg total) by mouth 3 (three) times daily.     ipratropium (ATROVENT) 0.03 % nasal spray      loratadine (CLARITIN) 10 MG tablet Take 10 mg by mouth daily.     metoprolol succinate (TOPROL-XL) 25 MG 24 hr tablet TAKE 1/2 TABLET AT BEDTIME 45 tablet 0   omeprazole (PRILOSEC) 20 MG capsule Take 1 capsule (20 mg total) by mouth 2 (two) times daily before a  meal.     polyethylene glycol powder (GLYCOLAX/MIRALAX) 17 GM/SCOOP powder Take 17 g by mouth 2 (two) times daily as needed. 850 g 0   potassium chloride SA (KLOR-CON M) 20 MEQ tablet Take 2 tablets (40 mEq total) by mouth daily. 60 tablet 5   rosuvastatin (CRESTOR) 20 MG tablet TAKE ONE TABLET (20 MG) BY MOUTH EVERY EVENING. OVERDUE FOR FOLLOW UP VISIT. PLEASE CALL OFFICE TO SCHEDULE APPOINT- MENT FOR FUTURE REFILLS. 90 tablet 0   sertraline (ZOLOFT) 100 MG tablet TAKE ONE AND A HALF TABLETS BY MOUTH EVERY DAY 150 tablet 2   vitamin B-12 (CYANOCOBALAMIN) 1000 MCG tablet Take 1 tablet (1,000 mcg total) by mouth daily.     No current facility-administered medications for this visit.    Allergies  Allergen Reactions   Lyrica [Pregabalin] Shortness Of Breath, Swelling and Other (See Comments)    chest tight   Hydrocodone Other (See Comments)    hallucinate   Ivp Dye [Iodinated Contrast Media] Hives and Itching        Lipitor [Atorvastatin] Nausea And Vomiting   Methylprednisolone Other (See Comments)    Severe weekness    Oxycodone Hcl Other (See Comments)    Hallucinations    Penicillin G Nausea And Vomiting   Shellfish Allergy Hives and Itching   Buspar [Buspirone]     Worsening mood   Clavulanic Acid    Prednisone    Codeine Nausea And Vomiting and Other (See Comments)   Cymbalta [Duloxetine Hcl] Nausea Only    GI upset at 30mg    Demerol Nausea And Vomiting   Doxycycline Nausea Only and Other (See Comments)    Weakness, sick to her stomach   Effexor [Venlafaxine] Nausea Only   Hydrochlorothiazide Other (See Comments)    Not an allergy but urinary frequency and sweating at 25mg    Hydroxyzine Other (See Comments)    Excessive sweating.    Metoprolol Other (See Comments)    Slowed body down, per pt    Protonix [Pantoprazole Sodium] Nausea Only   Trazodone And Nefazodone Other (See Comments)    Sedation       Social History   Socioeconomic History   Marital status:  Married    Spouse name: Fayrene Fearing    Number of children: 1   Years of education: Not on file   Highest education level: 11th grade  Occupational History    Comment: Retired    Occupation: Retired  Tobacco Use   Smoking status: Former    Current packs/day: 0.00    Average packs/day: 0.3 packs/day for 15.0 years (4.5 ttl pk-yrs)    Types: Cigarettes  Start date: 08/31/1981    Quit date: 08/31/1996    Years since quitting: 27.2    Passive exposure: Never   Smokeless tobacco: Never  Vaping Use   Vaping status: Never Used  Substance and Sexual Activity   Alcohol use: No    Alcohol/week: 0.0 standard drinks of alcohol   Drug use: No   Sexual activity: Not Currently  Other Topics Concern   Not on file  Social History Narrative   Lives with husband in a one level    Work - retired Producer, television/film/video   Diet - healthy   Right handed    Caffeine- 1 cup per day.      Had 1 daughter. Daughter is deceased   Social Drivers of Corporate investment banker Strain: Low Risk  (11/12/2023)   Overall Financial Resource Strain (CARDIA)    Difficulty of Paying Living Expenses: Not hard at all  Food Insecurity: No Food Insecurity (11/15/2023)   Hunger Vital Sign    Worried About Running Out of Food in the Last Year: Never true    Ran Out of Food in the Last Year: Never true  Transportation Needs: No Transportation Needs (11/15/2023)   PRAPARE - Administrator, Civil Service (Medical): No    Lack of Transportation (Non-Medical): No  Physical Activity: Sufficiently Active (05/25/2023)   Exercise Vital Sign    Days of Exercise per Week: 5 days    Minutes of Exercise per Session: 30 min  Stress: No Stress Concern Present (05/25/2023)   [employer] of Occupational Health - Occupational Stress Questionnaire    Feeling of Stress : Not at all  Social Connections: Moderately Integrated (11/11/2023)   Social Connection and Isolation Panel [NHANES]    Frequency of Communication with Friends and  Family: More than three times a week    Frequency of Social Gatherings with Friends and Family: More than three times a week    Attends Religious Services: 1 to 4 times per year    Active Member of [employer] or Organizations: No    Attends Banker Meetings: Never    Marital Status: Married  Catering manager Violence: Not At Risk (11/15/2023)   Humiliation, Afraid, Rape, and Kick questionnaire    Fear of Current or Ex-Partner: No    Emotionally Abused: No    Physically Abused: No    Sexually Abused: No      Family History  Problem Relation Age of Onset   Aneurysm Mother        brain   Heart disease Mother        Aneyursm    Hyperlipidemia Mother    Hypertension Mother    Varicose Veins Mother    Bleeding Disorder Mother    Heart disease Father    Cirrhosis Father    Heart attack Father    Hyperlipidemia Father    Hypertension Father    Cancer Sister        lung   Heart disease Sister        Aneurysm   Hyperlipidemia Sister    Hypertension Sister    Varicose Veins Sister    Bleeding Disorder Sister    Heart disease Brother        Before age 35   Aneurysm Brother    Deep vein thrombosis Brother    Birth defects Brother    Hyperlipidemia Brother    Hypertension Brother    Peripheral vascular disease Daughter  Hyperlipidemia Daughter    Hypertension Daughter    Hypertension Other    Breast cancer Neg Hx    Colon cancer Neg Hx    Vitals:   12/10/23 1113  BP: (!) 138/45  Pulse: 64  SpO2: 95%  Weight: 131 lb (59.4 kg)   Wt Readings from Last 3 Encounters:  12/10/23 131 lb (59.4 kg)  11/19/23 134 lb 3.2 oz (60.9 kg)  11/18/23 132 lb 9.6 oz (60.1 kg)   Lab Results  Component Value Date   CREATININE 0.83 11/19/2023   CREATININE 0.86 11/12/2023   CREATININE 0.95 11/11/2023    PHYSICAL EXAM:  General: Well appearing. No resp difficulty HEENT: normal Neck: supple, no JVD Cor: Regular rhythm, rate. No rubs, gallops or murmurs Lungs:  clear Abdomen: soft, nontender, nondistended. Extremities: no cyanosis, clubbing, rash, edema Neuro: alert & oriented X 3. Moves all 4 extremities w/o difficulty. Affect pleasant   ECG: not done   ASSESSMENT & PLAN:  1: Ischemic heart failure with preserved ejection fraction- - NYHA class III - euvolemic - weighing daily; reviewed parameters to call about - weight stable from last visit here 3 weeks ago - Echo 11/10/23: EF 60-65%, normal RV, mild MR - continue jardiance 10mg  daily; BMET today; patient assistance through PCP office - continue furosemide 40mg  daily/ potassium daily - discussed stopping amlodipine and starting spironolactone but she defers today; would like to discuss further with Dr Mariah Milling - BNP 11/09/23 was 204.2  2: HTN- - BP 138/45 - continue amlodipine 1.25mg  daily - saw PCP Para March) 03/25 - BMET 11/19/23 reviewed: sodium 139, potassium 4.2, creatinine 0.83 & GFR 70 - BMET today  3: CAD- - saw cardiology Mariah Milling) 04/24 - 3 vessel CABG in 2006 - Cardiac cath from 09/2007 demonstrated an atretic LIMA to LAD with patent vein grafts. - continue ASA 81mg  daily  4: Atrial fibrillation- - continue metoprolol succinate 12.5mg  daily - rate controlled today  5: Hyperlipidemia-  - LDL 07/17/22 was 61 - continue ezetimibe 10mg  daily - continue rosuvastatin 20mg  daily   Return in 3 months, sooner if needed.   Delma Freeze, FNP 12/10/23

## 2023-12-10 NOTE — Patient Instructions (Addendum)
 Medication Changes:  No medication changes today!  Lab Work:  Go DOWN to LOWER LEVEL (LL) to have your blood work completed inside of [employer] office.  We will only call you if the results are abnormal or if the provider would like to make medication changes.   Follow-Up in: Please follow up with the [facility] in 3 months with Clarisa Kindred, NP.  At the [facility], you and your health needs are our priority. We have a designated team specialized in the treatment of Heart Failure. This Care Team includes your primary Heart Failure Specialized Cardiologist (physician), Advanced Practice Providers (APPs- Physician Assistants and Nurse Practitioners), and Pharmacist who all work together to provide you with the care you need, when you need it.   You may see any of the following providers on your designated Care Team at your next follow up:  Dr. Arvilla Meres Dr. Marca Ancona Dr. Dorthula Nettles Dr. Theresia Bough Clarisa Kindred, FNP Enos Fling, RPH-CPP  Please be sure to bring in all your medications bottles to every appointment.   Need to Contact us:  If you have any questions or concerns before your next appointment please send [place] a message through [place] or call our office at [phone].    TO LEAVE A MESSAGE FOR THE NURSE SELECT OPTION 2, PLEASE LEAVE A MESSAGE INCLUDING: YOUR NAME DATE OF BIRTH CALL BACK NUMBER REASON FOR CALL**this is important as we prioritize the call backs  YOU WILL RECEIVE A CALL BACK THE SAME DAY AS LONG AS YOU CALL BEFORE 4:00 PM

## 2023-12-11 LAB — BASIC METABOLIC PANEL WITH GFR
BUN/Creatinine Ratio: 25 (ref 12–28)
BUN: 23 mg/dL (ref 8–27)
CO2: 23 mmol/L (ref 20–29)
Calcium: 9.6 mg/dL (ref 8.7–10.3)
Chloride: 96 mmol/L (ref 96–106)
Creatinine, Ser: 0.91 mg/dL (ref 0.57–1.00)
Glucose: 82 mg/dL (ref 70–99)
Potassium: 4.3 mmol/L (ref 3.5–5.2)
Sodium: 141 mmol/L (ref 134–144)
eGFR: 63 mL/min/{1.73_m2} (ref >59)

## 2023-12-17 ENCOUNTER — Ambulatory Visit: Admitting: Cardiovascular Disease

## 2023-12-19 NOTE — Progress Notes (Signed)
 Cardiology Office Note  Date:  12/20/2023   ID:  Catherine Green, DOB 09/07/1940, MRN [id number]  PCP:  Donnie Galea, MD   Chief Complaint  Patient presents with   6 month follow up     Patient c/o bilateral leg pain with walking with more in the left leg and when lying down at night, legs are very painful.     HPI:  [name] is a very pleasant 83 year-old woman with history  Former smoker started age 8 , stopped age 22 coronary artery disease,  bypass surgery,  peripheral vascular disease  right CEA, left CEA followed by Dr. Shirley Douglas, 01/2018: left ICA are consistent with a 40-59% stenosis. catheterization January 2009 showing patent vein grafts, atretic LIMA to the LAD.  Hx of anxiety Back surgery in May 2017, Chronic dizziness who presents for routine followup of her coronary artery disease.   LOV with myself 6/22 Seen by one of our providers April 2024  Was requested and reviewed In hospital 3/25: CHF, COPD Treated with IV Lasix  Discharge on lasix  40 daily, BMP stable  In follow-up today reports that her breathing is stable, no leg swelling  Troubled by leg pain, especially when in bed and with ambulation Leg weakness, gait instability Presents today in a wheelchair Husband who presents with her today reports that she gives out too easily  EKG personally reviewed by myself on todays visit EKG Interpretation Date/Time:  Monday December 20 2023 10:19:43 EDT Ventricular Rate:  62 PR Interval:    QRS Duration:  78 QT Interval:  462 QTC Calculation: 468 R Axis:   62  Text Interpretation: Normal sinus rhythm with PACs Nonspecific T wave abnormality When compared with ECG of 20-Dec-2023 10:17, No significant change was found Confirmed by Belva Boyden [phone]) on 12/20/2023 10:49:42 AM   Carotid 3/25 Right Carotid: Velocities in the right ICA are consistent with a 1-39% stenosis.  Left Carotid: Velocities in the left ICA are consistent with a 40-59% stenosis.    sister with lymphoma, at [facility]  Carotid ultrasound 2021 reviewed, stable disease Followed in [facility]  EKG personally reviewed by myself on todays visit EKG Interpretation Date/Time:  Monday December 20 2023 10:17:55 EDT Ventricular Rate:  61 PR Interval:    QRS Duration:  80 QT Interval:  454 QTC Calculation: 457 R Axis:   61  Text Interpretation: Normal sinus rhythm with PACs Nonspecific T wave abnormality When compared with ECG of 18-Nov-2023 10:30, Atrial fibrillation has replaced Sinus rhythm Confirmed by Belva Boyden [phone]) on 12/20/2023 10:43:11 AM   Of past medical history reviewed CT scan neck 2. Remote right carotid endarterectomy. Positive remodeling/fusiform aneurysm at the distal right common carotid without interval growth; the associated low-density plaque has ulcerated since prior. 3. 70% stenosis of the brachiocephalic origin. 4. 50% left common carotid stenosis.  30% left ICA stenosis.  Echocardiogram April 2021 normal ejection fraction, diastolic dysfunction Unable to estimate right heart pressures IVC did not appear dilated   MRI of her back reviewed with her showing DJD, foraminal narrowing Seen by Dr. Gwendlyn Lemmings.    Previous symptoms of shortness of breath and weight gain. Improved with Lasix  to take as needed.  She had her gallbladder taken out 03/13/2014.  she had "hair loss" on simvastatin , as well as sores on her head. Lipitor caused leg problems/myalgias.     PMH:   has a past medical history of Allergy, Anemia, Anxiety, Back pain, Carotid artery occlusion, Cerebrovascular disease, Chicken  pox, Coronary artery disease, Depression, Dizziness, Dizziness, DVT (deep venous thrombosis) (HCC), Fainting spell, GERD (gastroesophageal reflux disease), Headache, Heart murmur, Hyperlipidemia, Hypertension, Leg pain, Mitral regurgitation, PONV (postoperative nausea and vomiting), Pre-syncope, PVD (peripheral vascular disease) (HCC), Seasonal allergies, Shortness of  breath dyspnea, Swelling of both ankles, Thrombophlebitis, and Ulcer.  PSH:    Past Surgical History:  Procedure Laterality Date   ANTERIOR CERVICAL DECOMP/DISCECTOMY FUSION N/A 01/11/2023   Procedure: Anterior Cervical Decompression/Discectomy Fusion Cervical Five-Cervical Six - Cervical Six-Cervical Seven;  Surgeon: Agustina Aldrich, MD;  Location: [facility] OR;  Service: Neurosurgery;  Laterality: N/A;  3C   APPENDECTOMY     BREAST SURGERY     saline implants   CARDIAC CATHETERIZATION     CAROTID ENDARTERECTOMY  1992   CAROTID ENDARTERECTOMY Right 02/13/2005   Re-do Right CE   CATARACT EXTRACTION, BILATERAL  2013   CHOLECYSTECTOMY, LAPAROSCOPIC  04/02/2014   Dr. Hortensia Ma   CORONARY ARTERY BYPASS GRAFT  2006   x3 Dr. Matt Song   ENDARTERECTOMY Left 07/20/2014   Procedure: ENDARTERECTOMY CAROTID-LEFT;  Surgeon: Mayo Speck, MD;  Location: [facility] OR;  Service: Vascular;  Laterality: Left;   EYE SURGERY Bilateral 10/2011   Cataract Left eye   SEPTOPLASTY     with bilateral inferior turbinate reductions   SPINE SURGERY  12/2015   TOTAL ABDOMINAL HYSTERECTOMY     UPPER GASTROINTESTINAL ENDOSCOPY     had polyps removed from esophagus   UPPER GI ENDOSCOPY      Current Outpatient Medications  Medication Sig Dispense Refill   albuterol  (VENTOLIN  HFA) 108 (90 Base) MCG/ACT inhaler Inhale 2 puffs into the lungs every 6 (six) hours as needed for wheezing or shortness of breath. 8 g 3   amLODipine  (NORVASC ) 2.5 MG tablet Take 0.5 tablets (1.25 mg total) by mouth in the morning. 45 tablet 3   aspirin  81 MG tablet Take 1 tablet (81 mg total) by mouth daily. 30 tablet    Coenzyme Q10 (CO Q10) 100 MG CAPS Take 100 mg by mouth daily.     empagliflozin  (JARDIANCE ) 10 MG TABS tablet Take 1 tablet (10 mg total) by mouth daily before breakfast.     ezetimibe  (ZETIA ) 10 MG tablet TAKE ONE TABLET (10 MG) BY MOUTH EVERY DAY 90 tablet 0   furosemide  (LASIX ) 40 MG tablet Take 1 tablet (40 mg total) by mouth  daily. 30 tablet 5   gabapentin  (NEURONTIN ) 400 MG capsule Take 1 capsule (400 mg total) by mouth 3 (three) times daily.     ipratropium (ATROVENT) 0.03 % nasal spray      loratadine  (CLARITIN ) 10 MG tablet Take 10 mg by mouth daily.     metoprolol  succinate (TOPROL -XL) 25 MG 24 hr tablet TAKE 1/2 TABLET AT BEDTIME 45 tablet 0   omeprazole  (PRILOSEC) 20 MG capsule Take 1 capsule (20 mg total) by mouth 2 (two) times daily before a meal.     polyethylene glycol powder (GLYCOLAX /MIRALAX ) 17 GM/SCOOP powder Take 17 g by mouth 2 (two) times daily as needed. 850 g 0   potassium chloride  SA (KLOR-CON  M) 20 MEQ tablet Take 2 tablets (40 mEq total) by mouth daily. 60 tablet 5   rosuvastatin  (CRESTOR ) 20 MG tablet TAKE ONE TABLET (20 MG) BY MOUTH EVERY EVENING. OVERDUE FOR FOLLOW UP VISIT. PLEASE CALL OFFICE TO SCHEDULE APPOINT- MENT FOR FUTURE REFILLS. 90 tablet 0   sertraline  (ZOLOFT ) 100 MG tablet TAKE ONE AND A HALF TABLETS BY MOUTH EVERY DAY 150  tablet 2   vitamin B-12 (CYANOCOBALAMIN ) 1000 MCG tablet Take 1 tablet (1,000 mcg total) by mouth daily.     No current facility-administered medications for this visit.    Allergies:   Lyrica  [pregabalin ], Hydrocodone , Ivp dye [iodinated contrast media], Lipitor [atorvastatin], Methylprednisolone , Oxycodone  hcl, Penicillin g, Shellfish allergy, Buspar  [buspirone ], Clavulanic acid, Prednisone , Codeine, Cymbalta  [duloxetine  hcl], Demerol, Doxycycline , Effexor  [venlafaxine ], Hydrochlorothiazide , Hydroxyzine , Metoprolol , Protonix  [pantoprazole  sodium], and Trazodone  and nefazodone   Social History:  The patient  reports that she quit smoking about 27 years ago. Her smoking use included cigarettes. She started smoking about 42 years ago. She has a 4.5 pack-year smoking history. She has never been exposed to tobacco smoke. She has never used smokeless tobacco. She reports that she does not drink alcohol and does not use drugs.   Family History:   family history  includes Aneurysm in her brother and mother; Birth defects in her brother; Bleeding Disorder in her mother and sister; Cancer in her sister; Cirrhosis in her father; Deep vein thrombosis in her brother; Heart attack in her father; Heart disease in her brother, father, mother, and sister; Hyperlipidemia in her brother, daughter, father, mother, and sister; Hypertension in her brother, daughter, father, mother, sister, and another family member; Peripheral vascular disease in her daughter; Varicose Veins in her mother and sister.    Review of Systems: Review of Systems  Constitutional: Negative.   HENT: Negative.    Respiratory: Negative.    Cardiovascular: Negative.   Gastrointestinal: Negative.   Musculoskeletal:  Positive for back pain.  Neurological: Negative.   Psychiatric/Behavioral: Negative.    All other systems reviewed and are negative.   PHYSICAL EXAM: VS:  BP (!) 142/60 (BP Location: Left Arm, Patient Position: Sitting, Cuff Size: Normal)   Pulse 61   Ht 5\' 2"  (1.575 m)   Wt 132 lb 6 oz (60 kg)   SpO2 96%   BMI 24.21 kg/m  , BMI Body mass index is 24.21 kg/m. Constitutional:  oriented to person, place, and time. No distress.  HENT:  Head: Grossly normal Eyes:  no discharge. No scleral icterus.  Neck: No JVD, no carotid bruits  Cardiovascular: Regular rate and rhythm, no murmurs appreciated Pulmonary/Chest: Clear to auscultation bilaterally, no wheezes or rails Abdominal: Soft.  no distension.  no tenderness.  Musculoskeletal: Normal range of motion Neurological:  normal muscle tone. Coordination normal. No atrophy Skin: Skin warm and dry Psychiatric: normal affect, pleasant  Recent Labs: 09/20/2023: ALT 14; TSH 1.29 11/09/2023: B Natriuretic Peptide 204.2 11/12/2023: Hemoglobin 11.9; Magnesium  2.1; Platelets 274 12/10/2023: BUN 23; Creatinine, Ser 0.91; Potassium 4.3; Sodium 141    Lipid Panel Lab Results  Component Value Date   CHOL 148 07/17/2022   HDL 60.00  07/17/2022   LDLCALC 61 07/17/2022   TRIG 136.0 07/17/2022     Wt Readings from Last 3 Encounters:  12/20/23 132 lb 6 oz (60 kg)  12/10/23 131 lb (59.4 kg)  11/19/23 134 lb 3.2 oz (60.9 kg)     ASSESSMENT AND PLAN:  Shortness of breath Stable symptoms on Lasix  40, BMP stable No changes made  Adjustment disorder Prior history of anxiety, contributing to insomnia Loss of several family members,  Carotid stenosis, bilateral /PAD Stable disease bilaterally , Followed by vascular in [facility]  ultrasound March 2025, 40 to 59% on left, less than 39% on right  Essential hypertension -  Blood pressure is well controlled on today's visit. No changes made to the medications.  Hyperlipidemia,  unspecified hyperlipidemia type -  Unable to afford PCSK9 inhibitor,  back on Crestor  Zetia  again Having worsening leg pain, recommend she try to hold the Crestor  for several weeks again to see if this helps her symptoms Difficulty walking secondary to leg weakness, pain at night when sleeping We have ordered lower extremity Dopplers and ABIs  Atherosclerosis of coronary artery bypass graft of native heart without angina pectoris Currently with no symptoms of angina. No further workup at this time. Continue current medication regimen.  Abdominal pain Previous mesenteric ultrasound, flow stable Followed by vascular    Orders Placed This Encounter  Procedures   EKG 12-Lead      Signed, Juanda Noon, M.D., Ph.D. 12/20/2023  [facility] [place], [place] [phone]

## 2023-12-20 ENCOUNTER — Encounter: Payer: Self-pay | Admitting: Cardiovascular Disease

## 2023-12-20 ENCOUNTER — Ambulatory Visit: Attending: Cardiovascular Disease | Admitting: Cardiovascular Disease

## 2023-12-20 ENCOUNTER — Other Ambulatory Visit: Payer: Self-pay

## 2023-12-20 ENCOUNTER — Ambulatory Visit: Payer: Medicare HMO | Admitting: Cardiovascular Disease

## 2023-12-20 VITALS — BP 142/60 | HR 61 | Ht 62.0 in | Wt 132.4 lb

## 2023-12-20 DIAGNOSIS — I739 Peripheral vascular disease, unspecified: Secondary | ICD-10-CM | POA: Diagnosis not present

## 2023-12-20 DIAGNOSIS — I951 Orthostatic hypotension: Secondary | ICD-10-CM | POA: Diagnosis not present

## 2023-12-20 DIAGNOSIS — I480 Paroxysmal atrial fibrillation: Secondary | ICD-10-CM | POA: Diagnosis not present

## 2023-12-20 DIAGNOSIS — I10 Essential (primary) hypertension: Secondary | ICD-10-CM | POA: Diagnosis not present

## 2023-12-20 DIAGNOSIS — I272 Pulmonary hypertension, unspecified: Secondary | ICD-10-CM | POA: Diagnosis not present

## 2023-12-20 DIAGNOSIS — E785 Hyperlipidemia, unspecified: Secondary | ICD-10-CM | POA: Diagnosis not present

## 2023-12-20 DIAGNOSIS — R0989 Other specified symptoms and signs involving the circulatory and respiratory systems: Secondary | ICD-10-CM | POA: Diagnosis not present

## 2023-12-20 DIAGNOSIS — I6523 Occlusion and stenosis of bilateral carotid arteries: Secondary | ICD-10-CM | POA: Diagnosis not present

## 2023-12-20 DIAGNOSIS — I5032 Chronic diastolic (congestive) heart failure: Secondary | ICD-10-CM | POA: Diagnosis not present

## 2023-12-20 DIAGNOSIS — I25118 Atherosclerotic heart disease of native coronary artery with other forms of angina pectoris: Secondary | ICD-10-CM

## 2023-12-20 MED ORDER — ROSUVASTATIN CALCIUM 20 MG PO TABS: 20.0000 mg | ORAL_TABLET | Freq: Every day | ORAL | 3 refills | Status: DC

## 2023-12-20 MED ORDER — EZETIMIBE 10 MG PO TABS: 10.0000 mg | ORAL_TABLET | Freq: Every day | ORAL | 3 refills | Status: DC

## 2023-12-20 MED ORDER — AMLODIPINE BESYLATE 2.5 MG PO TABS: 2.5000 mg | ORAL_TABLET | Freq: Every morning | ORAL | 3 refills | Status: DC

## 2023-12-20 NOTE — Patient Instructions (Addendum)
 Medication Instructions:  Do a trial hold of the crestor  for a few weeks, See if leg pain gets better  Please increase the amlodipine  up to 2.5 mg daily  If you need a refill on your cardiac medications before your next appointment, please call your pharmacy.   Lab work: No new labs needed  Testing/Procedures: Your physician has requested that you have a lower extremity arterial duplex. During this test, ultrasound is used to evaluate arterial blood flow in the legs. Allow one hour for this exam. There are no restrictions or special instructions. This will take place at [address] [facility] [address], [place] [address]  Please note: We ask at that you not bring children with you during ultrasound (echo/ vascular) testing. Due to room size and safety concerns, children are not allowed in the ultrasound rooms during exams. Our front office staff cannot provide observation of children in our lobby area while testing is being conducted. An adult accompanying a patient to their appointment will only be allowed in the ultrasound room at the discretion of the ultrasound technician under special circumstances. We apologize for any inconvenience.   Your physician has requested that you have an ankle brachial index (ABI). During this test an ultrasound and blood pressure cuff are used to evaluate the arteries that supply the arms and legs with blood.  Allow thirty minutes for this exam.  There are no restrictions or special instructions.  This will take place at [address] [facility] [address]) [address], [place] [address]  Please note: We ask at that you not bring children with you during ultrasound (echo/ vascular) testing. Due to room size and safety concerns, children are not allowed in the ultrasound rooms during exams. Our front office staff cannot provide observation of children in our lobby area while testing is being conducted. An adult accompanying a patient to their  appointment will only be allowed in the ultrasound room at the discretion of the ultrasound technician under special circumstances. We apologize for any inconvenience.   Follow-Up: At [facility], you and your health needs are our priority.  As part of our continuing mission to provide you with exceptional heart care, we have created designated Provider Care Teams.  These Care Teams include your primary Cardiologist (physician) and Advanced Practice Providers (APPs -  Physician Assistants and Nurse Practitioners) who all work together to provide you with the care you need, when you need it.  You will need a follow up appointment in 12 months  Providers on your designated Care Team:   Laneta Pintos, NP Varney Gentleman, PA-C Cadence Gennaro Khat, [place]  COVID-19 Vaccine Information can be found at: [email or web address] For questions related to vaccine distribution or appointments, please email [email or web address] or call [phone].

## 2024-01-10 ENCOUNTER — Other Ambulatory Visit: Payer: Self-pay | Admitting: Physician Assistant

## 2024-01-12 ENCOUNTER — Ambulatory Visit (HOSPITAL_COMMUNITY)
Admission: RE | Admit: 2024-01-12 | Discharge: 2024-01-12 | Disposition: A | Discharge status: Home / Self Care | Source: Ambulatory Visit | Attending: Cardiovascular Disease | Admitting: Cardiovascular Disease

## 2024-01-12 ENCOUNTER — Encounter (HOSPITAL_COMMUNITY): Payer: Self-pay

## 2024-01-12 ENCOUNTER — Ambulatory Visit (HOSPITAL_BASED_OUTPATIENT_CLINIC_OR_DEPARTMENT_OTHER)
Admission: RE | Admit: 2024-01-12 | Discharge: 2024-01-12 | Discharge status: Home / Self Care | Source: Ambulatory Visit | Attending: Cardiovascular Disease | Admitting: Cardiovascular Disease

## 2024-01-12 DIAGNOSIS — I739 Peripheral vascular disease, unspecified: Secondary | ICD-10-CM | POA: Insufficient documentation

## 2024-01-13 LAB — VAS US ABI WITH/WO TBI
Left ABI: 1.02
Right ABI: 1.03

## 2024-01-14 ENCOUNTER — Encounter: Payer: Self-pay | Admitting: Emergency Medicine

## 2024-01-14 ENCOUNTER — Ambulatory Visit: Payer: Self-pay | Admitting: Cardiovascular Disease

## 2024-01-21 NOTE — Progress Notes (Unsigned)
 I saw Catherine Green in neurology clinic on 01/28/24 in follow up for weakness and gait instability.  HPI: Catherine Green is a 83 y.o. year old female with a history of lumbar spine disease s/p surgery (~2018), HTN, HLD, CAD s/p CABG (2004), carotid stenosis s/p surgery on both sides multiple times, CHF, afib, depression, PVD, edema (on lasix ), previous smoker who we last saw on 02/10/23.  To briefly review: 07/30/22: Patient has had weakness off and on for 2-3 years. The weakness is in her legs. It is especially bad when it is cold. She gets pain in her back, front of her legs, and down below knee. It is an electric pain. She can have leg soreness the day after bad electric pains. It has been getting worse. She states this has been going on for a while (not sure exactly when). She finds when she walks, things are worse. When she sits down, symptoms improve. When she is in a store walking, hanging on a cart helps her move better. She will use a walker in her condo.   Patient had one fall, on 07/07/22 when she fell at the dentist. She has almost fallen other times but grabs onto her husband.   She has a history of lumbar spine disease. She had surgery in ~2018 and has been told everything has been done that can be done. She previously got injections as well.   She takes gabapentin  400 mg every 8 hours for pain. This helps but causes some cognitive slowing for her. She takes B12 1000 mcg daily. She was previously on crestor  20 mg and zetia  10 mg daily for many years. She recently stopped the crestor  but is still on the zetia  due to concerns for weakness on 07/17/22. This has not changed her symptoms.    Patient denies diplopia, ptosis, problems chewing, changes to her voice. She occasionally gets choked when eating bread or something she has to chew a lot. She occasionally coughs when drinking water. She has had this looked at with endoscopy years ago and a polyp was removed. She does not think this  helped.   There are no neuromuscular respiratory weakness symptoms, particularly orthopnea>dyspnea.    Pseudobulbar affect is absent.   She endorses dry eyes and mouth. She denies bowel or bladder problems, early satiety, or postprandial bloating.   She has lost 30 pounds over the last year but stable over the last year. Patient feels this was due to taking lasix  and loss of edema.   EtOH use: None  Restrictive diet? No Family history of neuropathy/myopathy/NM disease? No   Patient had an EMG in 2019, prior to symptoms in her legs (normal EMG).   11/12/22: Labs were normal. EMG on 09/28/22 was normal with no evidence of neuropathy, myopathy, or left cervical or lumbosacral radiculopathy.   Patient's symptoms improved with therapy. She finished therapy recently and feels like she has returned to previously low. She has home exercises. She states she was keeping up with the exercises until she got sick about 3 weeks. She has a sinus infection and has felt dizzy and hearing worse than normal.   She has had no falls. She uses a walker at home.  02/10/23: Labs were normal. MRI cervical spine confirmed spinal stenosis, so patient was referred to spine surgery. Patient had C5-C7 fusion on 01/11/23.    She mentions that she is still weak. She is having difficulty swallowing, which she states is new from surgery. It  started a couple of weeks after surgery. Her voice is weaker as well.   She thinks overall she is walking better. She has not had any falls. Her legs have not given out on her. She denies any neck pain or numbness or tingling in arms or legs. She denies orthopnea. She denies muscle twitching.  Most recent Assessment and Plan (02/10/23): This is Catherine Green, a 83 y.o. female with gait instability and imbalance found to have cervical stenosis. She had C5-C7 fusion on 01/11/23. Her walking has improved some since surgery, despite not being very active. She mentions some difficulty  swallowing since the surgery. I am not sure if this is expected after the surgery due to local swelling, but I encouraged her to speak with spine surgeon about this and also if she needs physical therapy. I am monitoring closely, but do not see current evidence of other pathology such as motor neuron disease that may also explain symptoms. The normal EMG ion 09/28/22 also argues against this.   Plan: -Will continue to monitor symptoms -Encouraged patient to tell spine surgeon about swallowing difficulties -May consider speech evaluation if symptoms do not improve -Patient to ask spine surgeon about if more PT is needed -Fall precautions discussed  Since their last visit: Patient is not able to stand or walk. She has a lot of pain in her legs. It is throughout both legs. It feels like electric shocks. It feels like it is coming from her low back. If she stands and walks her legs get weak. When her legs are bothering her, she can lose her bowel or bladder. When she sits she gets some relief. She denies saddle anesthesia.  She has fallen multiple times.  At least visit, patient mentioned difficulty swallowing after cervical spine surgery. This improved.  She also mentions headaches, perhaps due to more reading, but this is not a pressing concern. It may also have been due to allergies.  MEDICATIONS:  Outpatient Encounter Medications as of 01/28/2024  Medication Sig   albuterol  (VENTOLIN  HFA) 108 (90 Base) MCG/ACT inhaler Inhale 2 puffs into the lungs every 6 (six) hours as needed for wheezing or shortness of breath.   amLODipine  (NORVASC ) 2.5 MG tablet Take 1 tablet (2.5 mg total) by mouth in the morning.   aspirin  81 MG tablet Take 1 tablet (81 mg total) by mouth daily.   Coenzyme Q10 (CO Q10) 100 MG CAPS Take 100 mg by mouth daily.   empagliflozin  (JARDIANCE ) 10 MG TABS tablet Take 1 tablet (10 mg total) by mouth daily before breakfast.   ezetimibe  (ZETIA ) 10 MG tablet Take 1 tablet (10 mg  total) by mouth daily.   furosemide  (LASIX ) 40 MG tablet Take 1 tablet (40 mg total) by mouth daily.   gabapentin  (NEURONTIN ) 400 MG capsule Take 1 capsule (400 mg total) by mouth 3 (three) times daily.   ipratropium (ATROVENT) 0.03 % nasal spray 2 sprays as needed.   loratadine  (CLARITIN ) 10 MG tablet Take 10 mg by mouth daily.   metoprolol  succinate (TOPROL -XL) 25 MG 24 hr tablet TAKE 1/2 TABLET AT BEDTIME   omeprazole  (PRILOSEC) 20 MG capsule Take 1 capsule (20 mg total) by mouth 2 (two) times daily before a meal.   polyethylene glycol powder (GLYCOLAX /MIRALAX ) 17 GM/SCOOP powder Take 17 g by mouth 2 (two) times daily as needed.   potassium chloride  SA (KLOR-CON  M) 20 MEQ tablet Take 2 tablets (40 mEq total) by mouth daily.   rosuvastatin  (CRESTOR ) 20 MG  tablet Take 1 tablet (20 mg total) by mouth daily. (Patient not taking: Reported on 01/28/2024)   sertraline  (ZOLOFT ) 100 MG tablet TAKE ONE AND A HALF TABLETS BY MOUTH EVERY DAY   vitamin B-12 (CYANOCOBALAMIN ) 1000 MCG tablet Take 1 tablet (1,000 mcg total) by mouth daily.   No facility-administered encounter medications on file as of 01/28/2024.    PAST MEDICAL HISTORY: Past Medical History:  Diagnosis Date   Allergy    hay fever   Anemia    Anxiety    Back pain    Carotid artery occlusion    Cerebrovascular disease    extracranial; occlusive   Chicken pox    Coronary artery disease    Depression    Dizziness    Dizziness    DVT (deep venous thrombosis) (HCC)    Fainting spell    GERD (gastroesophageal reflux disease)    Headache    Heart murmur    Hyperlipidemia    Hypertension    Leg pain    Mitral regurgitation    PONV (postoperative nausea and vomiting)    severe nausea and vomiting   Pre-syncope    PVD (peripheral vascular disease) (HCC)    endarterectomy by Dr. Shirley Douglas   Seasonal allergies    Shortness of breath dyspnea    wth ambulation at times   Swelling of both ankles    and abdomen; takes Lasix  when  needed   Thrombophlebitis    following childbirth   Ulcer     PAST SURGICAL HISTORY: Past Surgical History:  Procedure Laterality Date   ANTERIOR CERVICAL DECOMP/DISCECTOMY FUSION N/A 01/11/2023   Procedure: Anterior Cervical Decompression/Discectomy Fusion Cervical Five-Cervical Six - Cervical Six-Cervical Seven;  Surgeon: Agustina Aldrich, MD;  Location: [facility] OR;  Service: Neurosurgery;  Laterality: N/A;  3C   APPENDECTOMY     BREAST SURGERY     saline implants   CARDIAC CATHETERIZATION     CAROTID ENDARTERECTOMY  1992   CAROTID ENDARTERECTOMY Right 02/13/2005   Re-do Right CE   CATARACT EXTRACTION, BILATERAL  2013   CHOLECYSTECTOMY, LAPAROSCOPIC  04/02/2014   Dr. Hortensia Ma   CORONARY ARTERY BYPASS GRAFT  2006   x3 Dr. Matt Song   ENDARTERECTOMY Left 07/20/2014   Procedure: ENDARTERECTOMY CAROTID-LEFT;  Surgeon: Mayo Speck, MD;  Location: [facility] OR;  Service: Vascular;  Laterality: Left;   EYE SURGERY Bilateral 10/2011   Cataract Left eye   SEPTOPLASTY     with bilateral inferior turbinate reductions   SPINE SURGERY  12/2015   TOTAL ABDOMINAL HYSTERECTOMY     UPPER GASTROINTESTINAL ENDOSCOPY     had polyps removed from esophagus   UPPER GI ENDOSCOPY      ALLERGIES: Allergies  Allergen Reactions   Lyrica  [Pregabalin ] Shortness Of Breath, Swelling and Other (See Comments)    chest tight   Hydrocodone  Other (See Comments)    hallucinate   Ivp Dye [Iodinated Contrast Media] Hives and Itching        Lipitor [Atorvastatin] Nausea And Vomiting   Methylprednisolone  Other (See Comments)    Severe weekness    Oxycodone  Hcl Other (See Comments)    Hallucinations    Penicillin G Nausea And Vomiting   Shellfish Allergy Hives and Itching   Buspar  [Buspirone ]     Worsening mood   Clavulanic Acid    Prednisone     Codeine Nausea And Vomiting and Other (See Comments)   Cymbalta  [Duloxetine  Hcl] Nausea Only    GI upset at 30mg   Demerol Nausea And Vomiting   Doxycycline  Nausea  Only and Other (See Comments)    Weakness, sick to her stomach   Effexor  [Venlafaxine ] Nausea Only   Hydrochlorothiazide  Other (See Comments)    Not an allergy but urinary frequency and sweating at 25mg    Hydroxyzine  Other (See Comments)    Excessive sweating.    Metoprolol  Other (See Comments)    Slowed body down, per pt    Protonix  [Pantoprazole  Sodium] Nausea Only   Trazodone  And Nefazodone Other (See Comments)    Sedation     FAMILY HISTORY: Family History  Problem Relation Age of Onset   Aneurysm Mother        brain   Heart disease Mother        Aneyursm    Hyperlipidemia Mother    Hypertension Mother    Varicose Veins Mother    Bleeding Disorder Mother    Heart disease Father    Cirrhosis Father    Heart attack Father    Hyperlipidemia Father    Hypertension Father    Cancer Sister        lung   Heart disease Sister        Aneurysm   Hyperlipidemia Sister    Hypertension Sister    Varicose Veins Sister    Bleeding Disorder Sister    Heart disease Brother        Before age 37   Aneurysm Brother    Deep vein thrombosis Brother    Birth defects Brother    Hyperlipidemia Brother    Hypertension Brother    Peripheral vascular disease Daughter    Hyperlipidemia Daughter    Hypertension Daughter    Hypertension Other    Breast cancer Neg Hx    Colon cancer Neg Hx     SOCIAL HISTORY: Social History   Tobacco Use   Smoking status: Former    Current packs/day: 0.00    Average packs/day: 0.3 packs/day for 15.0 years (4.5 ttl pk-yrs)    Types: Cigarettes    Start date: 08/31/1981    Quit date: 08/31/1996    Years since quitting: 27.4    Passive exposure: Never   Smokeless tobacco: Never  Vaping Use   Vaping status: Never Used  Substance Use Topics   Alcohol use: No    Alcohol/week: 0.0 standard drinks of alcohol   Drug use: No   Social History   Social History Narrative   Lives with husband in a one level    Work - retired Producer, television/film/video   Diet -  healthy   Right handed    Caffeine- 1 cup per day.      Had 1 daughter. Daughter is deceased    Objective:  Vital Signs:  BP (!) 129/49   Pulse 67   Ht 5\' 2"  (1.575 m)   Wt 134 lb (60.8 kg)   SpO2 95%   BMI 24.51 kg/m   General: General appearance: Awake and alert. No distress. Cooperative with exam.  Skin: No obvious rash or jaundice. HEENT: Atraumatic. Anicteric. Lungs: Non-labored breathing on room air   Neurological: Mental Status: Alert. Speech fluent. No pseudobulbar affect Cranial Nerves: CNII: No RAPD. Visual fields intact. CNIII, IV, VI: PERRL. No nystagmus. EOMI. CN V: Facial sensation intact bilaterally to fine touch. CN VII: Facial muscles symmetric and strong. No ptosis at rest. CN VIII: Hears finger rub well bilaterally. CN IX: No hypophonia. CN X: Palate elevates symmetrically. CN XI: Full strength shoulder shrug  bilaterally. CN XII: Tongue protrusion full and midline. No atrophy or fasciculations. No significant dysarthria Motor: Tone is normal Strength 5/5 in bilateral upper and lower extremities Reflexes:  Right Left  Bicep 2+ 2+  Tricep 2+ 2+  BrRad 2+ 2+  Knee 2+ 2+  Ankle trace trace   Pathological Reflexes: Babinski: flexor response bilaterally Hoffman: absent bilaterally Troemner: absent bilaterally Sensation: Pinprick: Diminished in right lateral lower leg Coordination: Intact finger-to- nose-finger bilaterally. Gait: Able to stand. Walks slowly, narrow based. Developed pain in back radiating into right leg after about 15 feet of walking   Lab and Test Review: New results: BMP (12/10/23) unremarkable CBC (11/12/23) unremarkable TSH (09/20/23) wnl  Lumbar spine xray (09/20/23): IMPRESSION: 1. No fracture or acute finding. 2. Transitional vertebra and mild degenerative changes as detailed. Stable appearance from the prior radiographs.  Previously reviewed results: 11/11/21: Vit E wnl Copper  wnl   07/30/22: HMGCR ab: < 2 Vit  E wnl Copper  wnl B1 wnl IFE: no M protein ANA/ENA wnl   07/17/22 labs: CK: 134 (409 four years ago) B12: 874 TSH: 1.02 ESR: 25 CBC and CMP wnl   HbA1c (02/07/22): 5.8   MRI lumbar spine wo contrast (11/12/20): FINDINGS: Segmentation: Consistent with the previous exams, L5 is a transitional vertebra, largely sacralized.   Alignment:  Normal   Vertebrae:  No fracture or primary bone lesion.   Conus medullaris and cauda equina: Conus extends to the L1 level. Conus and cauda equina appear normal.   Paraspinal and other soft tissues: Negative. Insignificant small renal cysts.   Disc levels:   T12-L1 and L1-2: Normal.   L2-3: Mild bulging of the disc. Mild ligamentous prominence. Mild canal narrowing but no neural compression. No change since 2020.   L3-4: Mild bulging of the disc. Mild ligamentous prominence. Mild canal narrowing but no neural compression. No change since 2020.   L4-5: Previous partial in ectomy on the right. Moderate bulging of the disc. Mild facet hypertrophy. Mild narrowing of the lateral recesses but without visible neural compression. No change since 2020.   L5-S1: Transitional level.  Rudimentary normal disc.  No stenosis.   IMPRESSION: 1. Consistent with the previous exams, L5 is a transitional vertebra, largely sacralized. 2. No change since the study 05/25/2019. Mild chronic degenerative changes at L2-3, L3-4 and L4-5 with disc bulges and ligamentous prominence but no apparent neural compression. Findings could contribute to back pain. Previous right laminotomy L4-5.   MRI brain w/wo contrast (08/03/18): FINDINGS: Brain: IAC protocol was performed including thin section imaging through the posterior fossa before and after intravenous contrast. Seventh and 8th cranial nerves are normal. Negative for vestibular schwannoma. Basilar cisterns normal. Brainstem and cerebellum normal. Mastoid sinus is clear bilaterally. No enhancing mass in  the posterior fossa or temporal bone.   Ventricle size and cerebral volume normal. Negative for acute infarct. Scattered white matter hyperintensities bilaterally compatible with chronic microvascular ischemia.   Vascular: Normal arterial flow voids. Normal venous enhancement without thrombosis.   Skull and upper cervical spine: Negative   Sinuses/Orbits: Paranasal sinuses clear. Mastoid sinus clear. Bilateral cataract surgery.   Other: None   IMPRESSION: No cause for hearing loss. Negative for vestibular schwannoma or other mass in the posterior fossa.   Mild chronic microvascular ischemic change in the cerebral white matter bilaterally.   EMG (05/26/18 - [facility]): FINDINGS: NERVE CONDUCTION STUDY: Bilateral tibial and peroneal motor responses are normal. Bilateral sural and superficial peroneal sensory responses are normal. Bilateral tibial F  wave latencies are normal. NEEDLE ELECTROMYOGRAPHY: Needle examination of left upper extremity and left lower extremity is unremarkable. IMPRESSION:  This is a normal study. No electrodiagnostic evidence of large fiber neuropathy or myopathy at this time.   EMG (09/28/22): NCV & EMG Findings: Extensive electrodiagnostic evaluation of the left upper and lower limbs show: Left sural, superficial peroneal/fibular, median, ulnar, and radial sensory responses are within normal limits. Left peroneal/fibular (EDB), tibial (AH), median (APB), and ulnar (ADM) motor responses are within normal limits. Left H reflex latency is within normal limits. There is no evidence of active or chronic motor axon loss changes affecting any of the tested muscles on needle examination. Motor unit configuration and recruitment pattern is within normal limits.   Impression: This is a normal electrodiagnostic evaluation. There is no electrodiagnostic evidence of a large fiber peripheral neuropathy, myopathy, left lumbosacral (L2-S1) motor radiculopathy, or left  cervical (C5-C8) motor radiculopathy.  MRI cervical spine wo contrast (12/05/22): FINDINGS: Alignment: Straightening of the normal cervical lordosis.   Vertebrae: Chronic fusion of the facets on the right at C3-4.   Cord: See below regarding spinal stenosis with cord deformity most pronounced at C5-6. Probable early abnormal T2 signal within the cord.   Posterior Fossa, vertebral arteries, paraspinal tissues: Negative   Disc levels:   The foramen magnum is widely patent. There is ordinary mild osteoarthritis of the C1-2 articulation but no encroachment upon the neural structures.   C2-3: Mild disc bulge. Mild facet osteoarthritis. Mild left foraminal stenosis.   C3-4: Chronic facet fusion on the right. No disc pathology. No canal or foraminal stenosis.   C4-5: Endplate osteophytes and small central disc protrusion. Narrowing of the ventral subarachnoid space but no compression of cord. AP diameter of the canal in the midline 8.2 mm. Facet degeneration on the left. Mild left foraminal narrowing.   C5-6: Spondylosis with endplate osteophytes and a broad-based disc herniation more prominent to the right of midline. Spinal stenosis with AP diameter in the midline only 6.5 mm. Effacement the subarachnoid space and some deformity the cord, more on the right. Early abnormal T2 signal at this level. Mild to moderate foraminal stenosis.   C6-7: Endplate osteophytes and broad-based disc herniation with slight caudal down turning. Narrowing of the subarachnoid space with AP diameter of the canal only 6.7 mm. Mild cord deformity. Bilateral foraminal stenosis of a moderate degree.   C7-T1: Minimal disc bulge.  No canal or foraminal stenosis.   IMPRESSION: 1. Degenerative spondylosis at C5-6 with endplate osteophytes and broad-based disc herniation more prominent to the right of midline. Spinal stenosis with AP diameter of the canal only 6.5 mm. Effacement of the subarachnoid space  and some deformity of the cord, more on the right. Early abnormal T2 signal within the cord at this level. Mild to moderate bilateral foraminal stenosis. Patient would be at risk of compressive myelopathy based on these findings. 2. C6-7: Endplate osteophytes and broad-based disc herniation with slight caudal down turning. Spinal stenosis with AP diameter of the canal only 6.7 mm. Mild cord deformity. Moderate bilateral foraminal stenosis. Some risk of compressive myelopathy at this level as well. 3. C4-5: Endplate osteophytes and small central disc protrusion. AP diameter of the canal 8.2 mm. Mild left foraminal narrowing. 4. Chronic facet fusion on the right at C3-4.   Cervical spine xray (01/11/23): FINDINGS: Two intraoperative fluoroscopic views of the cervical spine. Alignment is anatomic. C5, C6, C7 anterior fusion plate is present. 10 seconds of fluoro time. Dose:  1.06 micro gray.   IMPRESSION: C5, C6, C7 anterior fusion.  ASSESSMENT: This is Catherine Green, a 83 y.o. female with difficulty ambulating due to back pain radiating into legs. Symptoms will improve if she sits. She has had multiple falls. She has also had episodes of bowel and bladder incontinence with her symptoms. This is most concerning from lumbar stenosis with neurogenic claudication, perhaps also with pressure on conus or cauda equina. I will get MRI of lumbar spine stat and get her back with her spine surgeon.   Plan: -MRI lumbar spine wo contrast - will order STAT -Recommend patient see her spine surgeon, Dr. Gwendlyn Lemmings, will send my notes -Discussed reasons to go to ED including inability to move legs, hold bowel or bladder, or saddle anesthesia  Return to clinic to be determined  Total time spent reviewing records, interview, history/exam, documentation, and coordination of care on day of encounter:  35 min  Rommie Coats, MD

## 2024-01-27 ENCOUNTER — Telehealth: Payer: Self-pay | Admitting: Cardiovascular Disease

## 2024-01-27 NOTE — Telephone Encounter (Signed)
 Spoke with patient.  Patient had thought that another appointment or test was to be scheduled.  Reassured her that since her lower extremity ABI results showed unchanged flow compared to 2022, no further vascular testing is needed at this time.  Will follow up with patient in 12 months.  Encouraged patient to call if she needed any further assistance.

## 2024-01-27 NOTE — Telephone Encounter (Signed)
 Pt is requesting cb to discuss whether or not other vasc testing is still needed. He rec'vd a call to schedule but states she was told it wasn't needed.

## 2024-01-28 ENCOUNTER — Encounter: Payer: Self-pay | Admitting: Neurology

## 2024-01-28 ENCOUNTER — Ambulatory Visit: Admitting: Neurology

## 2024-01-28 VITALS — BP 129/49 | HR 67 | Ht 62.0 in | Wt 134.0 lb

## 2024-01-28 DIAGNOSIS — R269 Unspecified abnormalities of gait and mobility: Secondary | ICD-10-CM

## 2024-01-28 DIAGNOSIS — M4712 Other spondylosis with myelopathy, cervical region: Secondary | ICD-10-CM

## 2024-01-28 DIAGNOSIS — R2689 Other abnormalities of gait and mobility: Secondary | ICD-10-CM | POA: Diagnosis not present

## 2024-01-28 DIAGNOSIS — R159 Full incontinence of feces: Secondary | ICD-10-CM

## 2024-01-28 DIAGNOSIS — R32 Unspecified urinary incontinence: Secondary | ICD-10-CM | POA: Diagnosis not present

## 2024-01-28 DIAGNOSIS — M48062 Spinal stenosis, lumbar region with neurogenic claudication: Secondary | ICD-10-CM | POA: Diagnosis not present

## 2024-01-28 DIAGNOSIS — M4722 Other spondylosis with radiculopathy, cervical region: Secondary | ICD-10-CM

## 2024-01-28 NOTE — Patient Instructions (Signed)
 I saw you today for difficulty walking and pain in your legs and losing bowel and bladder. I'm worried this is due to compression of your nerves/spine in the low part of your back.  I am ordering an MRI of your lumbar spine and putting it as STAT. If you do not have this scheduled next week, please let me know.  I am also going to refer you back to Dr. Gwendlyn Lemmings and send my notes and concerns to his office. I want you to call them for an appointment as well.  If you cannot move your legs, cannot hold your bowel or bladder, or cannot feel your groin area, this could be an emergency and you should go to the nearest emergency room for immediate evaluation.  I will be in touch when I have your results.  The physicians and staff at [facility] are committed to providing excellent care. You may receive a survey requesting feedback about your experience at our office. We strive to receive "very good" responses to the survey questions. If you feel that your experience would prevent you from giving the office a "very good " response, please contact our office to try to remedy the situation. We may be reached at [phone]. Thank you for taking the time out of your busy day to complete the survey.  Rommie Coats, MD [facility]

## 2024-01-31 ENCOUNTER — Other Ambulatory Visit: Payer: Self-pay

## 2024-01-31 DIAGNOSIS — M545 Low back pain, unspecified: Secondary | ICD-10-CM

## 2024-01-31 DIAGNOSIS — M79604 Pain in right leg: Secondary | ICD-10-CM

## 2024-01-31 DIAGNOSIS — R269 Unspecified abnormalities of gait and mobility: Secondary | ICD-10-CM

## 2024-01-31 DIAGNOSIS — M48062 Spinal stenosis, lumbar region with neurogenic claudication: Secondary | ICD-10-CM

## 2024-01-31 DIAGNOSIS — R29898 Other symptoms and signs involving the musculoskeletal system: Secondary | ICD-10-CM

## 2024-02-01 ENCOUNTER — Telehealth: Payer: Self-pay

## 2024-02-01 NOTE — Telephone Encounter (Signed)
 Called Pt and made her aware of [facility] reported calling with no answer. I gave her the number to call and get scheduled.

## 2024-02-03 ENCOUNTER — Ambulatory Visit
Admission: RE | Admit: 2024-02-03 | Discharge: 2024-02-03 | Discharge status: Home / Self Care | Source: Ambulatory Visit | Attending: Neurology

## 2024-02-03 DIAGNOSIS — Z9889 Other specified postprocedural states: Secondary | ICD-10-CM | POA: Diagnosis not present

## 2024-02-03 DIAGNOSIS — M5126 Other intervertebral disc displacement, lumbar region: Secondary | ICD-10-CM | POA: Diagnosis not present

## 2024-02-03 DIAGNOSIS — M48062 Spinal stenosis, lumbar region with neurogenic claudication: Secondary | ICD-10-CM

## 2024-02-03 DIAGNOSIS — M47816 Spondylosis without myelopathy or radiculopathy, lumbar region: Secondary | ICD-10-CM | POA: Diagnosis not present

## 2024-02-03 DIAGNOSIS — M48061 Spinal stenosis, lumbar region without neurogenic claudication: Secondary | ICD-10-CM | POA: Diagnosis not present

## 2024-02-04 ENCOUNTER — Ambulatory Visit: Payer: Self-pay | Admitting: Neurology

## 2024-02-04 NOTE — Telephone Encounter (Signed)
 Called pt and made her aware of results per doctor Hill. She understood. I am faxing the results over to Dr. Adonis Alamin at [facility]. Done.

## 2024-02-04 NOTE — Telephone Encounter (Signed)
Received FMLA.  

## 2024-02-07 ENCOUNTER — Encounter (HOSPITAL_COMMUNITY): Payer: Self-pay

## 2024-02-07 ENCOUNTER — Inpatient Hospital Stay (HOSPITAL_COMMUNITY)
Admission: EM | Admit: 2024-02-07 | Discharge: 2024-02-18 | DRG: 064 | Disposition: A | Discharge status: Skilled Nursing Facility | Attending: Internal Medicine | Admitting: Internal Medicine

## 2024-02-07 ENCOUNTER — Emergency Department (HOSPITAL_COMMUNITY)

## 2024-02-07 ENCOUNTER — Inpatient Hospital Stay (HOSPITAL_COMMUNITY)

## 2024-02-07 ENCOUNTER — Other Ambulatory Visit: Payer: Self-pay

## 2024-02-07 DIAGNOSIS — Z888 Allergy status to other drugs, medicaments and biological substances status: Secondary | ICD-10-CM

## 2024-02-07 DIAGNOSIS — M6281 Muscle weakness (generalized): Secondary | ICD-10-CM | POA: Diagnosis not present

## 2024-02-07 DIAGNOSIS — M48061 Spinal stenosis, lumbar region without neurogenic claudication: Secondary | ICD-10-CM | POA: Diagnosis not present

## 2024-02-07 DIAGNOSIS — I6523 Occlusion and stenosis of bilateral carotid arteries: Secondary | ICD-10-CM

## 2024-02-07 DIAGNOSIS — R4182 Altered mental status, unspecified: Secondary | ICD-10-CM | POA: Diagnosis not present

## 2024-02-07 DIAGNOSIS — Z83438 Family history of other disorder of lipoprotein metabolism and other lipidemia: Secondary | ICD-10-CM

## 2024-02-07 DIAGNOSIS — E875 Hyperkalemia: Secondary | ICD-10-CM | POA: Diagnosis present

## 2024-02-07 DIAGNOSIS — I4892 Unspecified atrial flutter: Secondary | ICD-10-CM | POA: Diagnosis not present

## 2024-02-07 DIAGNOSIS — I5033 Acute on chronic diastolic (congestive) heart failure: Secondary | ICD-10-CM | POA: Diagnosis present

## 2024-02-07 DIAGNOSIS — R4701 Aphasia: Secondary | ICD-10-CM | POA: Diagnosis present

## 2024-02-07 DIAGNOSIS — E785 Hyperlipidemia, unspecified: Secondary | ICD-10-CM | POA: Diagnosis not present

## 2024-02-07 DIAGNOSIS — Z832 Family history of diseases of the blood and blood-forming organs and certain disorders involving the immune mechanism: Secondary | ICD-10-CM

## 2024-02-07 DIAGNOSIS — D649 Anemia, unspecified: Secondary | ICD-10-CM | POA: Diagnosis not present

## 2024-02-07 DIAGNOSIS — Z885 Allergy status to narcotic agent status: Secondary | ICD-10-CM

## 2024-02-07 DIAGNOSIS — R531 Weakness: Secondary | ICD-10-CM | POA: Diagnosis not present

## 2024-02-07 DIAGNOSIS — Z981 Arthrodesis status: Secondary | ICD-10-CM | POA: Diagnosis not present

## 2024-02-07 DIAGNOSIS — I2510 Atherosclerotic heart disease of native coronary artery without angina pectoris: Secondary | ICD-10-CM | POA: Diagnosis present

## 2024-02-07 DIAGNOSIS — K219 Gastro-esophageal reflux disease without esophagitis: Secondary | ICD-10-CM | POA: Diagnosis not present

## 2024-02-07 DIAGNOSIS — E1140 Type 2 diabetes mellitus with diabetic neuropathy, unspecified: Secondary | ICD-10-CM | POA: Diagnosis not present

## 2024-02-07 DIAGNOSIS — F419 Anxiety disorder, unspecified: Secondary | ICD-10-CM | POA: Diagnosis present

## 2024-02-07 DIAGNOSIS — G936 Cerebral edema: Secondary | ICD-10-CM | POA: Diagnosis present

## 2024-02-07 DIAGNOSIS — Z7984 Long term (current) use of oral hypoglycemic drugs: Secondary | ICD-10-CM | POA: Diagnosis not present

## 2024-02-07 DIAGNOSIS — R1310 Dysphagia, unspecified: Secondary | ICD-10-CM | POA: Diagnosis not present

## 2024-02-07 DIAGNOSIS — J441 Chronic obstructive pulmonary disease with (acute) exacerbation: Secondary | ICD-10-CM | POA: Diagnosis not present

## 2024-02-07 DIAGNOSIS — Z90710 Acquired absence of both cervix and uterus: Secondary | ICD-10-CM

## 2024-02-07 DIAGNOSIS — Z86718 Personal history of other venous thrombosis and embolism: Secondary | ICD-10-CM

## 2024-02-07 DIAGNOSIS — I639 Cerebral infarction, unspecified: Secondary | ICD-10-CM | POA: Diagnosis present

## 2024-02-07 DIAGNOSIS — Z7901 Long term (current) use of anticoagulants: Secondary | ICD-10-CM | POA: Diagnosis not present

## 2024-02-07 DIAGNOSIS — I25708 Atherosclerosis of coronary artery bypass graft(s), unspecified, with other forms of angina pectoris: Secondary | ICD-10-CM

## 2024-02-07 DIAGNOSIS — R29723 NIHSS score 23: Secondary | ICD-10-CM | POA: Diagnosis not present

## 2024-02-07 DIAGNOSIS — I6529 Occlusion and stenosis of unspecified carotid artery: Secondary | ICD-10-CM | POA: Diagnosis present

## 2024-02-07 DIAGNOSIS — I110 Hypertensive heart disease with heart failure: Secondary | ICD-10-CM | POA: Diagnosis not present

## 2024-02-07 DIAGNOSIS — I25810 Atherosclerosis of coronary artery bypass graft(s) without angina pectoris: Secondary | ICD-10-CM | POA: Diagnosis present

## 2024-02-07 DIAGNOSIS — R1312 Dysphagia, oropharyngeal phase: Secondary | ICD-10-CM | POA: Diagnosis not present

## 2024-02-07 DIAGNOSIS — R414 Neurologic neglect syndrome: Secondary | ICD-10-CM | POA: Diagnosis present

## 2024-02-07 DIAGNOSIS — Z79899 Other long term (current) drug therapy: Secondary | ICD-10-CM

## 2024-02-07 DIAGNOSIS — I700 Atherosclerosis of aorta: Secondary | ICD-10-CM | POA: Diagnosis not present

## 2024-02-07 DIAGNOSIS — I6389 Other cerebral infarction: Secondary | ICD-10-CM | POA: Diagnosis not present

## 2024-02-07 DIAGNOSIS — I779 Disorder of arteries and arterioles, unspecified: Secondary | ICD-10-CM | POA: Diagnosis not present

## 2024-02-07 DIAGNOSIS — G629 Polyneuropathy, unspecified: Secondary | ICD-10-CM | POA: Diagnosis not present

## 2024-02-07 DIAGNOSIS — Z951 Presence of aortocoronary bypass graft: Secondary | ICD-10-CM | POA: Diagnosis not present

## 2024-02-07 DIAGNOSIS — Z8249 Family history of ischemic heart disease and other diseases of the circulatory system: Secondary | ICD-10-CM

## 2024-02-07 DIAGNOSIS — D72829 Elevated white blood cell count, unspecified: Secondary | ICD-10-CM | POA: Diagnosis present

## 2024-02-07 DIAGNOSIS — I69320 Aphasia following cerebral infarction: Secondary | ICD-10-CM | POA: Diagnosis not present

## 2024-02-07 DIAGNOSIS — R29818 Other symptoms and signs involving the nervous system: Secondary | ICD-10-CM | POA: Diagnosis not present

## 2024-02-07 DIAGNOSIS — I739 Peripheral vascular disease, unspecified: Secondary | ICD-10-CM | POA: Diagnosis not present

## 2024-02-07 DIAGNOSIS — R930 Abnormal findings on diagnostic imaging of skull and head, not elsewhere classified: Secondary | ICD-10-CM | POA: Diagnosis not present

## 2024-02-07 DIAGNOSIS — E782 Mixed hyperlipidemia: Secondary | ICD-10-CM | POA: Diagnosis not present

## 2024-02-07 DIAGNOSIS — I10 Essential (primary) hypertension: Secondary | ICD-10-CM | POA: Diagnosis not present

## 2024-02-07 DIAGNOSIS — I6782 Cerebral ischemia: Secondary | ICD-10-CM | POA: Diagnosis not present

## 2024-02-07 DIAGNOSIS — J449 Chronic obstructive pulmonary disease, unspecified: Secondary | ICD-10-CM | POA: Diagnosis not present

## 2024-02-07 DIAGNOSIS — I5032 Chronic diastolic (congestive) heart failure: Secondary | ICD-10-CM | POA: Diagnosis present

## 2024-02-07 DIAGNOSIS — Z7982 Long term (current) use of aspirin: Secondary | ICD-10-CM

## 2024-02-07 DIAGNOSIS — Z91041 Radiographic dye allergy status: Secondary | ICD-10-CM

## 2024-02-07 DIAGNOSIS — R29810 Facial weakness: Secondary | ICD-10-CM | POA: Diagnosis not present

## 2024-02-07 DIAGNOSIS — I63412 Cerebral infarction due to embolism of left middle cerebral artery: Secondary | ICD-10-CM | POA: Diagnosis not present

## 2024-02-07 DIAGNOSIS — G8191 Hemiplegia, unspecified affecting right dominant side: Secondary | ICD-10-CM | POA: Diagnosis present

## 2024-02-07 DIAGNOSIS — Z9049 Acquired absence of other specified parts of digestive tract: Secondary | ICD-10-CM

## 2024-02-07 DIAGNOSIS — Z555 Less than a high school diploma: Secondary | ICD-10-CM

## 2024-02-07 DIAGNOSIS — I69391 Dysphagia following cerebral infarction: Secondary | ICD-10-CM | POA: Diagnosis not present

## 2024-02-07 DIAGNOSIS — Z91013 Allergy to seafood: Secondary | ICD-10-CM

## 2024-02-07 DIAGNOSIS — I493 Ventricular premature depolarization: Secondary | ICD-10-CM | POA: Diagnosis present

## 2024-02-07 DIAGNOSIS — I63512 Cerebral infarction due to unspecified occlusion or stenosis of left middle cerebral artery: Secondary | ICD-10-CM | POA: Diagnosis not present

## 2024-02-07 DIAGNOSIS — N2889 Other specified disorders of kidney and ureter: Secondary | ICD-10-CM | POA: Diagnosis not present

## 2024-02-07 DIAGNOSIS — F32A Depression, unspecified: Secondary | ICD-10-CM | POA: Diagnosis not present

## 2024-02-07 DIAGNOSIS — Z886 Allergy status to analgesic agent status: Secondary | ICD-10-CM

## 2024-02-07 DIAGNOSIS — Z66 Do not resuscitate: Secondary | ICD-10-CM | POA: Diagnosis not present

## 2024-02-07 DIAGNOSIS — I69351 Hemiplegia and hemiparesis following cerebral infarction affecting right dominant side: Secondary | ICD-10-CM | POA: Diagnosis not present

## 2024-02-07 DIAGNOSIS — Z8672 Personal history of thrombophlebitis: Secondary | ICD-10-CM

## 2024-02-07 DIAGNOSIS — Z87891 Personal history of nicotine dependence: Secondary | ICD-10-CM

## 2024-02-07 DIAGNOSIS — R55 Syncope and collapse: Secondary | ICD-10-CM | POA: Diagnosis not present

## 2024-02-07 DIAGNOSIS — I4891 Unspecified atrial fibrillation: Secondary | ICD-10-CM | POA: Diagnosis present

## 2024-02-07 DIAGNOSIS — I6522 Occlusion and stenosis of left carotid artery: Secondary | ICD-10-CM | POA: Diagnosis present

## 2024-02-07 DIAGNOSIS — R404 Transient alteration of awareness: Secondary | ICD-10-CM | POA: Diagnosis not present

## 2024-02-07 DIAGNOSIS — I6350 Cerebral infarction due to unspecified occlusion or stenosis of unspecified cerebral artery: Principal | ICD-10-CM

## 2024-02-07 DIAGNOSIS — Z743 Need for continuous supervision: Secondary | ICD-10-CM | POA: Diagnosis not present

## 2024-02-07 DIAGNOSIS — Z881 Allergy status to other antibiotic agents status: Secondary | ICD-10-CM

## 2024-02-07 DIAGNOSIS — M48062 Spinal stenosis, lumbar region with neurogenic claudication: Secondary | ICD-10-CM

## 2024-02-07 DIAGNOSIS — Z7401 Bed confinement status: Secondary | ICD-10-CM | POA: Diagnosis not present

## 2024-02-07 LAB — DIFFERENTIAL
Abs Immature Granulocytes: 0.04 10*3/uL (ref 0.00–0.07)
Basophils Absolute: 0 10*3/uL (ref 0.0–0.1)
Basophils Relative: 0 %
Eosinophils Absolute: 0 10*3/uL (ref 0.0–0.5)
Eosinophils Relative: 0 %
Immature Granulocytes: 0 %
Lymphocytes Relative: 8 %
Lymphs Abs: 0.9 10*3/uL (ref 0.7–4.0)
Monocytes Absolute: 0.6 10*3/uL (ref 0.1–1.0)
Monocytes Relative: 6 %
Neutro Abs: 9.8 10*3/uL — ABNORMAL HIGH (ref 1.7–7.7)
Neutrophils Relative %: 86 %

## 2024-02-07 LAB — I-STAT CHEM 8, ED
BUN: 16 mg/dL (ref 8–23)
BUN: 17 mg/dL (ref 8–23)
Calcium, Ion: 1.12 mmol/L — ABNORMAL LOW (ref 1.15–1.40)
Calcium, Ion: 1.14 mmol/L — ABNORMAL LOW (ref 1.15–1.40)
Chloride: 102 mmol/L (ref 98–111)
Chloride: 102 mmol/L (ref 98–111)
Creatinine, Ser: 0.9 mg/dL (ref 0.44–1.00)
Creatinine, Ser: 1 mg/dL (ref 0.44–1.00)
Glucose, Bld: 143 mg/dL — ABNORMAL HIGH (ref 70–99)
Glucose, Bld: 225 mg/dL — ABNORMAL HIGH (ref 70–99)
HCT: 41 % (ref 36.0–46.0)
HCT: 43 % (ref 36.0–46.0)
Hemoglobin: 13.9 g/dL (ref 12.0–15.0)
Hemoglobin: 14.6 g/dL (ref 12.0–15.0)
Potassium: 3.6 mmol/L (ref 3.5–5.1)
Potassium: 4.2 mmol/L (ref 3.5–5.1)
Sodium: 138 mmol/L (ref 135–145)
Sodium: 139 mmol/L (ref 135–145)
TCO2: 24 mmol/L (ref 22–32)
TCO2: 27 mmol/L (ref 22–32)

## 2024-02-07 LAB — ETHANOL: Alcohol, Ethyl (B): <15 mg/dL (ref <15)

## 2024-02-07 LAB — COMPREHENSIVE METABOLIC PANEL WITH GFR
ALT: 14 U/L (ref 0–44)
AST: 22 U/L (ref 15–41)
Albumin: 4 g/dL (ref 3.5–5.0)
Alkaline Phosphatase: 101 U/L (ref 38–126)
Anion gap: 9 (ref 5–15)
BUN: 15 mg/dL (ref 8–23)
CO2: 24 mmol/L (ref 22–32)
Calcium: 8.6 mg/dL — ABNORMAL LOW (ref 8.9–10.3)
Chloride: 104 mmol/L (ref 98–111)
Creatinine, Ser: 0.93 mg/dL (ref 0.44–1.00)
GFR, Estimated: >60 mL/min (ref >60)
Glucose, Bld: 142 mg/dL — ABNORMAL HIGH (ref 70–99)
Potassium: 4.2 mmol/L (ref 3.5–5.1)
Sodium: 137 mmol/L (ref 135–145)
Total Bilirubin: 0.4 mg/dL (ref 0.0–1.2)
Total Protein: 6.7 g/dL (ref 6.5–8.1)

## 2024-02-07 LAB — CBC
HCT: 40.8 % (ref 36.0–46.0)
Hemoglobin: 12.8 g/dL (ref 12.0–15.0)
MCH: 27.1 pg (ref 26.0–34.0)
MCHC: 31.4 g/dL (ref 30.0–36.0)
MCV: 86.4 fL (ref 80.0–100.0)
Platelets: 281 10*3/uL (ref 150–400)
RBC: 4.72 MIL/uL (ref 3.87–5.11)
RDW: 13.7 % (ref 11.5–15.5)
WBC: 11.4 10*3/uL — ABNORMAL HIGH (ref 4.0–10.5)
nRBC: 0 % (ref 0.0–0.2)

## 2024-02-07 LAB — SARS CORONAVIRUS 2 BY RT PCR: SARS Coronavirus 2 by RT PCR: NEGATIVE

## 2024-02-07 LAB — PROTIME-INR
INR: 1 (ref 0.8–1.2)
Prothrombin Time: 13.3 s (ref 11.4–15.2)

## 2024-02-07 LAB — I-STAT CG4 LACTIC ACID, ED: Lactic Acid, Venous: 1.6 mmol/L (ref 0.5–1.9)

## 2024-02-07 LAB — APTT: aPTT: 24 s (ref 24–36)

## 2024-02-07 LAB — CBG MONITORING, ED: Glucose-Capillary: 132 mg/dL — ABNORMAL HIGH (ref 70–99)

## 2024-02-07 MED ORDER — ACETAMINOPHEN 160 MG/5ML PO SOLN: 650.0000 mg | ORAL | Status: DC | PRN

## 2024-02-07 MED ORDER — METHYLPREDNISOLONE SODIUM SUCC 125 MG IJ SOLR
INTRAMUSCULAR | Status: AC
  Filled 2024-02-07: qty 2

## 2024-02-07 MED ORDER — GABAPENTIN 400 MG PO CAPS
400.0000 mg | ORAL_CAPSULE | Freq: Three times a day (TID) | ORAL | Status: DC
  Administered 2024-02-09 – 2024-02-18 (×29): 400 mg via ORAL
  Filled 2024-02-07 (×31): qty 1

## 2024-02-07 MED ORDER — ASPIRIN 300 MG RE SUPP
300.0000 mg | Freq: Every day | RECTAL | Status: DC
  Administered 2024-02-07: 300 mg via RECTAL
  Filled 2024-02-07: qty 1

## 2024-02-07 MED ORDER — EZETIMIBE 10 MG PO TABS
10.0000 mg | ORAL_TABLET | Freq: Every day | ORAL | Status: DC
  Administered 2024-02-09 – 2024-02-18 (×10): 10 mg via ORAL
  Filled 2024-02-07 (×10): qty 1

## 2024-02-07 MED ORDER — ACETAMINOPHEN 650 MG RE SUPP: 650.0000 mg | RECTAL | Status: DC | PRN

## 2024-02-07 MED ORDER — ROSUVASTATIN CALCIUM 20 MG PO TABS: 20.0000 mg | ORAL_TABLET | Freq: Every day | ORAL | Status: DC

## 2024-02-07 MED ORDER — SODIUM CHLORIDE 0.9% FLUSH
3.0000 mL | Freq: Once | INTRAVENOUS | Status: AC
  Administered 2024-02-07: 3 mL via INTRAVENOUS

## 2024-02-07 MED ORDER — METHYLPREDNISOLONE SODIUM SUCC 125 MG IJ SOLR
125.0000 mg | Freq: Once | INTRAMUSCULAR | Status: AC
  Administered 2024-02-07: 125 mg via INTRAVENOUS

## 2024-02-07 MED ORDER — DIPHENHYDRAMINE HCL 50 MG/ML IJ SOLN
25.0000 mg | Freq: Once | INTRAMUSCULAR | Status: AC
  Administered 2024-02-07: 25 mg via INTRAVENOUS

## 2024-02-07 MED ORDER — SENNOSIDES-DOCUSATE SODIUM 8.6-50 MG PO TABS: 1.0000 | ORAL_TABLET | Freq: Every evening | ORAL | Status: DC | PRN

## 2024-02-07 MED ORDER — SERTRALINE HCL 50 MG PO TABS
150.0000 mg | ORAL_TABLET | Freq: Every day | ORAL | Status: DC
  Administered 2024-02-09 – 2024-02-18 (×10): 150 mg via ORAL
  Filled 2024-02-07 (×10): qty 1

## 2024-02-07 MED ORDER — DIPHENHYDRAMINE HCL 50 MG/ML IJ SOLN
INTRAMUSCULAR | Status: AC
  Filled 2024-02-07: qty 1

## 2024-02-07 MED ORDER — IOHEXOL 350 MG/ML SOLN
100.0000 mL | Freq: Once | INTRAVENOUS | Status: AC | PRN
  Administered 2024-02-07: 100 mL via INTRAVENOUS

## 2024-02-07 MED ORDER — STROKE: EARLY STAGES OF RECOVERY BOOK
Freq: Once | Status: AC
  Filled 2024-02-07 (×2): qty 1

## 2024-02-07 MED ORDER — ACETAMINOPHEN 325 MG PO TABS
650.0000 mg | ORAL_TABLET | ORAL | Status: DC | PRN
Start: 2024-02-07 — End: 2024-02-19

## 2024-02-07 MED ORDER — ALBUTEROL SULFATE (2.5 MG/3ML) 0.083% IN NEBU: 2.5000 mg | INHALATION_SOLUTION | Freq: Four times a day (QID) | RESPIRATORY_TRACT | Status: DC | PRN

## 2024-02-07 MED ORDER — FLUTICASONE FUROATE-VILANTEROL 100-25 MCG/ACT IN AEPB
1.0000 | INHALATION_SPRAY | Freq: Every day | RESPIRATORY_TRACT | Status: DC
  Administered 2024-02-12 – 2024-02-18 (×6): 1 via RESPIRATORY_TRACT
  Filled 2024-02-07 (×3): qty 28

## 2024-02-07 MED ORDER — ALBUTEROL SULFATE HFA 108 (90 BASE) MCG/ACT IN AERS: 2.0000 | INHALATION_SPRAY | Freq: Four times a day (QID) | RESPIRATORY_TRACT | Status: DC | PRN

## 2024-02-07 NOTE — ED Triage Notes (Addendum)
 Patient arrives via [facility] as a code stroke from home, activated on arrival to ED. Per patient's husband, patient was completely normal this morning while folding laundry. Patient fell at 0900, patient LKW between 0830 and 0900. Husband was able to get patient up to chair. Around 1330, patient became aphasic with right sided weakness with inability to lift arm or leg and left sided facial droop. Patient had LVO of 6 per EMS. No IV access established en route.   EMS vitals BP 158/60 Hr 80 O2 98 on room air CBG 167

## 2024-02-07 NOTE — H&P (Signed)
 History and Physical   [name]:[id number] DOB: May 18, 1941 DOA: 02/07/2024  PCP: Donnie Galea, MD   Patient coming from: Home  Chief Complaint: Code stroke  HPI: Catherine Green is a 83 y.o. female with medical history significant of hypertension, hyperlipidemia, GERD, carotid artery disease, CAD status post CABG, atrial fibrillation, anemia, chronic diastolic CHF, anxiety, depression, neuropathy, COPD, spinal stenosis presenting as a code stroke.  History obtained with assistance of chart review and family due to patient unable to participate.  Patient was normal this morning.  Patient was folding laundry around 9 AM when husband heard her fall.  He is able to help her to a chair.  He noted later around 1:30 PM that she had not been speaking much and that she was now unable to speak and that she appeared to be unable to move her right side.  EMS was called.  Patient unable to participate in review of systems  ED Course: Vital signs in the ED notable for blood pressure in the 160s to 170s systolic.  Lab workup included CMP with glucose 142, calcium  8.6.  CBC with leukocytosis to 11.4.  PT, PTT, INR within normal limits.  Lactic acid normal.  COVID screening pending.  Ethanol level negative.  Lipid panel and A1c pending.  CT head showed acute left MCA infarct.  CTA head and neck perfusion study showed occlusive disease at the left intracranial ICA, left A1, left M1; also noted unchanged since fusiform aneurysmal dilation of the right carotid, stable 50% left carotid stenosis, stable 75% radiocephalic stenosis.  Patient received aspirin , Solu-Medrol , Benadryl  in the ED.  Neurology consulted and have discussed patient's prognosis with family and patient has been made DNR/DNI.  Stroke workup has been ordered.  Review of Systems: As per HPI otherwise all other systems reviewed and are negative.  Past Medical History:  Diagnosis Date   Acute hypoxic respiratory failure (HCC) 11/09/2023    Acute non-recurrent maxillary sinusitis 10/28/2022   Allergy    hay fever   Anemia    Anxiety    Back pain    Carotid artery occlusion    Cerebrovascular disease    extracranial; occlusive   Chicken pox    Coronary artery disease    Depression    Dizziness    Dizziness    DVT (deep venous thrombosis) (HCC)    Fainting spell    GERD (gastroesophageal reflux disease)    Headache    Heart murmur    Hyperlipidemia    Hypertension    Leg pain    Mitral regurgitation    PONV (postoperative nausea and vomiting)    severe nausea and vomiting   Pre-syncope    PVD (peripheral vascular disease) (HCC)    endarterectomy by Dr. Shirley Douglas   Seasonal allergies    Shortness of breath dyspnea    wth ambulation at times   Swelling of both ankles    and abdomen; takes Lasix  when needed   Thrombophlebitis    following childbirth   Ulcer     Past Surgical History:  Procedure Laterality Date   ANTERIOR CERVICAL DECOMP/DISCECTOMY FUSION N/A 01/11/2023   Procedure: Anterior Cervical Decompression/Discectomy Fusion Cervical Five-Cervical Six - Cervical Six-Cervical Seven;  Surgeon: Agustina Aldrich, MD;  Location: [facility] OR;  Service: Neurosurgery;  Laterality: N/A;  3C   APPENDECTOMY     BREAST SURGERY     saline implants   CARDIAC CATHETERIZATION     CAROTID ENDARTERECTOMY  1992  CAROTID ENDARTERECTOMY Right 02/13/2005   Re-do Right CE   CATARACT EXTRACTION, BILATERAL  2013   CHOLECYSTECTOMY, LAPAROSCOPIC  04/02/2014   Dr. Hortensia Ma   CORONARY ARTERY BYPASS GRAFT  2006   x3 Dr. Matt Song   ENDARTERECTOMY Left 07/20/2014   Procedure: ENDARTERECTOMY CAROTID-LEFT;  Surgeon: Mayo Speck, MD;  Location: [facility] OR;  Service: Vascular;  Laterality: Left;   EYE SURGERY Bilateral 10/2011   Cataract Left eye   SEPTOPLASTY     with bilateral inferior turbinate reductions   SPINE SURGERY  12/2015   TOTAL ABDOMINAL HYSTERECTOMY     UPPER GASTROINTESTINAL ENDOSCOPY     had polyps removed from  esophagus   UPPER GI ENDOSCOPY      Social History  reports that she quit smoking about 27 years ago. Her smoking use included cigarettes. She started smoking about 42 years ago. She has a 4.5 pack-year smoking history. She has never been exposed to tobacco smoke. She has never used smokeless tobacco. She reports that she does not drink alcohol and does not use drugs.  Allergies  Allergen Reactions   Lyrica  [Pregabalin ] Shortness Of Breath, Swelling and Other (See Comments)    Chest tightness, also    Hydrocodone  Other (See Comments)    Hallucinations    Ivp Dye [Iodinated Contrast Media] Hives and Itching        Lipitor [Atorvastatin] Nausea And Vomiting   Methylprednisolone  Other (See Comments)    Severe weekness    Oxycodone  Hcl Other (See Comments)    Hallucinations    Penicillin G Nausea And Vomiting   Shellfish Allergy Hives and Itching   Buspar  [Buspirone ] Other (See Comments)    Worsening mood   Clavulanic Acid Other (See Comments)    Reaction not noted   Prednisone  Other (See Comments)    Reaction not noted   Codeine Nausea And Vomiting and Other (See Comments)   Cymbalta  [Duloxetine  Hcl] Nausea Only    GI upset at 30mg    Demerol Nausea And Vomiting   Doxycycline  Nausea Only and Other (See Comments)    Weakness, sick to her stomach   Effexor  [Venlafaxine ] Nausea Only   Hydrochlorothiazide  Other (See Comments)    Not an allergy but urinary frequency and sweating at 25mg    Hydroxyzine  Other (See Comments)    Excessive sweating.    Metoprolol  Other (See Comments)    "Slowed body down," per pt    Protonix  [Pantoprazole  Sodium] Nausea Only   Trazodone  And Nefazodone Other (See Comments)    Sedation     Family History  Problem Relation Age of Onset   Aneurysm Mother        brain   Heart disease Mother        Aneyursm    Hyperlipidemia Mother    Hypertension Mother    Varicose Veins Mother    Bleeding Disorder Mother    Heart disease Father    Cirrhosis  Father    Heart attack Father    Hyperlipidemia Father    Hypertension Father    Cancer Sister        lung   Heart disease Sister        Aneurysm   Hyperlipidemia Sister    Hypertension Sister    Varicose Veins Sister    Bleeding Disorder Sister    Heart disease Brother        Before age 57   Aneurysm Brother    Deep vein thrombosis Brother  Birth defects Brother    Hyperlipidemia Brother    Hypertension Brother    Peripheral vascular disease Daughter    Hyperlipidemia Daughter    Hypertension Daughter    Hypertension Other    Breast cancer Neg Hx    Colon cancer Neg Hx   Reviewed on admission  Prior to Admission medications   Medication Sig Start Date End Date Taking? Authorizing Provider  albuterol  (VENTOLIN  HFA) 108 (90 Base) MCG/ACT inhaler Inhale 2 puffs into the lungs every 6 (six) hours as needed for wheezing or shortness of breath. 10/26/22  Yes Donnie Galea, MD  amLODipine  (NORVASC ) 2.5 MG tablet Take 1 tablet (2.5 mg total) by mouth in the morning. 12/20/23  Yes Gollan, Timothy J, MD  aspirin  81 MG tablet Take 1 tablet (81 mg total) by mouth daily. 03/29/18  Yes Gollan, Timothy J, MD  BREO ELLIPTA  100-25 MCG/ACT AEPB Inhale 1 puff into the lungs daily.   Yes [provider]  Coenzyme Q10 (CO Q10) 100 MG CAPS Take 100 mg by mouth daily.   Yes [provider]  empagliflozin  (JARDIANCE ) 10 MG TABS tablet Take 1 tablet (10 mg total) by mouth daily before breakfast. 12/10/23  Yes Shawnee Dellen A, FNP  ezetimibe  (ZETIA ) 10 MG tablet Take 1 tablet (10 mg total) by mouth daily. 12/20/23  Yes Gollan, Timothy J, MD  furosemide  (LASIX ) 40 MG tablet Take 1 tablet (40 mg total) by mouth daily. 12/03/23  Yes Donnie Galea, MD  gabapentin  (NEURONTIN ) 400 MG capsule Take 1 capsule (400 mg total) by mouth 3 (three) times daily. 11/12/23  Yes Shah, Vipul, MD  ipratropium (ATROVENT) 0.03 % nasal spray Place 2 sprays into both nostrils 2 (two) times daily as needed for  rhinitis. 05/26/23  Yes [provider]  loratadine  (CLARITIN ) 10 MG tablet Take 10 mg by mouth daily.   Yes [provider]  metoprolol  succinate (TOPROL -XL) 25 MG 24 hr tablet TAKE 1/2 TABLET AT BEDTIME 01/10/24  Yes Gollan, Timothy J, MD  potassium chloride  SA (KLOR-CON  M) 20 MEQ tablet Take 2 tablets (40 mEq total) by mouth daily. 12/03/23  Yes Donnie Galea, MD  rosuvastatin  (CRESTOR ) 20 MG tablet Take 1 tablet (20 mg total) by mouth daily. 12/20/23  Yes Gollan, Timothy J, MD  vitamin B-12 (CYANOCOBALAMIN ) 1000 MCG tablet Take 1 tablet (1,000 mcg total) by mouth daily. 04/24/18  Yes Donnie Galea, MD  omeprazole  (PRILOSEC) 20 MG capsule Take 1 capsule (20 mg total) by mouth 2 (two) times daily before a meal. Patient not taking: Reported on 02/07/2024 02/05/22   Donnie Galea, MD  polyethylene glycol powder (GLYCOLAX /MIRALAX ) 17 GM/SCOOP powder Take 17 g by mouth 2 (two) times daily as needed. 10/26/22   Donnie Galea, MD  sertraline  (ZOLOFT ) 100 MG tablet TAKE ONE AND A HALF TABLETS BY MOUTH EVERY DAY Patient taking differently: Take 150 mg by mouth daily. 12/02/23   Donnie Galea, MD    Physical Exam: Vitals:   02/07/24 1520 02/07/24 1530 02/07/24 1534 02/07/24 1536  BP: (!) 173/93 (!) 168/74    Pulse:  83    Resp: 15 17    Temp:    (!) 96.8 F (36 C)  TempSrc:    Axillary  SpO2:  95%    Weight:   62.4 kg   Height:   5\' 2"  (1.575 m)     Physical Exam Constitutional:      General: She is not in  acute distress.    Appearance: Normal appearance.  HENT:     Head: Normocephalic and atraumatic.     Mouth/Throat:     Mouth: Mucous membranes are moist.     Pharynx: Oropharynx is clear.  Eyes:     Extraocular Movements: Extraocular movements intact.     Pupils: Pupils are equal, round, and reactive to light.  Cardiovascular:     Rate and Rhythm: Normal rate and regular rhythm.     Pulses: Normal pulses.     Heart sounds: Normal heart sounds.  Pulmonary:      Effort: Pulmonary effort is normal. No respiratory distress.     Breath sounds: Normal breath sounds.  Abdominal:     General: Bowel sounds are normal. There is no distension.     Palpations: Abdomen is soft.     Tenderness: There is no abdominal tenderness.  Musculoskeletal:        General: No swelling or deformity.  Skin:    General: Skin is warm and dry.  Neurological:     Comments: Mental Status: Patient is awake and alert Patient will look at provider on her left but would not look to her right for me.  Does not appear to follow commands we will hold her arm or leg where I put it on the left but not moving right upper or lower extremity. Exam limited by patient's ability to participate.    Labs on Admission: I have personally reviewed following labs and imaging studies  CBC: Recent Labs  Lab 02/07/24 1419 02/07/24 1437 02/07/24 1451  WBC 11.4*  --   --   NEUTROABS 9.8*  --   --   HGB 12.8 13.9 14.6  HCT 40.8 41.0 43.0  MCV 86.4  --   --   PLT 281  --   --     Basic Metabolic Panel: Recent Labs  Lab 02/07/24 1419 02/07/24 1437 02/07/24 1451  NA 137 139 138  K 4.2 4.2 3.6  CL 104 102 102  CO2 24  --   --   GLUCOSE 142* 143* 225*  BUN 15 17 16   CREATININE 0.93 1.00 0.90  CALCIUM  8.6*  --   --     GFR: Estimated Creatinine Clearance: 41.1 mL/min (by C-G formula based on SCr of 0.9 mg/dL).  Liver Function Tests: Recent Labs  Lab 02/07/24 1419  AST 22  ALT 14  ALKPHOS 101  BILITOT 0.4  PROT 6.7  ALBUMIN 4.0    Urine analysis:    Component Value Date/Time   COLORURINE YELLOW 07/13/2014 1025   APPEARANCEUR CLEAR 07/13/2014 1025   LABSPEC 1.020 07/13/2014 1025   PHURINE 7.0 07/13/2014 1025   GLUCOSEU NEGATIVE 07/13/2014 1025   HGBUR NEGATIVE 07/13/2014 1025   BILIRUBINUR Neg 11/27/2019 1027   KETONESUR NEGATIVE 07/13/2014 1025   PROTEINUR Negative 11/27/2019 1027   PROTEINUR NEGATIVE 07/13/2014 1025   UROBILINOGEN 0.2 11/27/2019 1027    UROBILINOGEN 1.0 07/13/2014 1025   NITRITE Neg 11/27/2019 1027   NITRITE NEGATIVE 07/13/2014 1025   LEUKOCYTESUR Negative 11/27/2019 1027    Radiological Exams on Admission: CT ANGIO HEAD NECK W WO CM W PERF (CODE STROKE) Result Date: 02/07/2024 CLINICAL DATA:  Neuro deficit, acute, stroke suspected. EXAM: CT ANGIOGRAPHY HEAD AND NECK CT PERFUSION BRAIN TECHNIQUE: Multidetector CT imaging of the head and neck was performed using the standard protocol during bolus administration of intravenous contrast. Multiplanar CT image reconstructions and MIPs were obtained to evaluate the vascular anatomy.  Carotid stenosis measurements (when applicable) are obtained utilizing NASCET criteria, using the distal internal carotid diameter as the denominator. Multiphase CT imaging of the brain was performed following IV bolus contrast injection. Subsequent parametric perfusion maps were calculated using RAPID software. RADIATION DOSE REDUCTION: This exam was performed according to the departmental dose-optimization program which includes automated exposure control, adjustment of the mA and/or kV according to patient size and/or use of iterative reconstruction technique. CONTRAST:  OMNIPAQUE IOHEXOL 350 MG/ML SOLN COMPARISON:  CTA neck 04/22/2020 FINDINGS: CTA NECK FINDINGS Aortic arch: Standard branching with prominent calcified plaque involving the great vessel origins including a proximally 75% stenosis of the origin of the brachiocephalic artery, similar to the prior CTA. 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 Similar appearance of calcified and ulcerated soft plaque in this region. Widely patent ICA. Left carotid system: Patent with 50% stenosis of the common carotid artery origin and less than 50% stenosis of the proximal ICA, similar to the prior CTA. Vertebral  arteries: Patent without evidence of a significant stenosis or dissection within limitation of streak artifact which obscures a portion of the right V1 segment. Element left vertebral artery. Skeleton: C5-C7 ACDF. Advanced upper cervical facet arthrosis. C3-4 facet ankylosis. Other neck: No evidence of cervical lymphadenopathy or mass. Upper chest: Partially visualized small to moderate bilateral pleural effusions with associated atelectasis. Status post CABG. Review of the MIP images confirms the above findings CTA HEAD FINDINGS Anterior circulation: The intracranial left ICA is patent proximally, however there is progressively diminished opacification more distally with the vessel appearing occluded beginning in the cavernous segment through the terminus. The left M1 and proximal left A1 segments are also occluded. There is reconstitution of the left ACA via the anterior communicating artery. There is mild-to-moderate reconstitution of the distal left M1 segment and left MCA branch vessels. The intracranial right ICA is patent with mild nonstenotic atherosclerosis. The right ACA and right MCA are patent without evidence of a significant proximal stenosis. No aneurysm is identified. Posterior circulation: The intracranial vertebral arteries are patent to the basilar with the right being hypoplastic distal to the PICA origin. Patent PICA and SCA origins are visualized bilaterally. The basilar artery is patent and mildly irregular without a significant stenosis. Posterior communicating arteries are diminutive or absent. Both PCAs are patent without evidence of a significant proximal stenosis. No aneurysm is identified. Venous sinuses: As permitted by contrast timing, patent. Anatomic variants: None. Review of the MIP images confirms the above findings CT Brain Perfusion Findings: CT perfusion imaging is felt to be of limited diagnostic utility due to bolus timing, with the arterial upstroke being incompletely  captured and without a good venous outflow curve. The perfusion software does not detect the large left MCA infarct which is apparent on the earlier noncontrast head CT and reports 22 mL of mismatch volume. These results were communicated to Dr. Lindzen at 3:21 pm on 02/07/2024 by text page via the [facility] messaging system. IMPRESSION: 1. 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 2. Limited CT perfusion imaging as described above. 3. 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 4. Unchanged 50% stenosis of the origin of the left common carotid artery and 75% stenosis of the brachiocephalic artery. 5. Partially visualized small to moderate bilateral pleural effusions. 6.  Aortic Atherosclerosis (  ICD10-I70.0). Electronically Signed   By: Aundra Lee M.D.   On: 02/07/2024 15:40   CT HEAD CODE STROKE WO CONTRAST Result Date: 02/07/2024 CLINICAL DATA:  Code stroke. Neuro deficit, acute, stroke suspected. Fall this morning. Altered mental status. Aphasia, right-sided deficits, and left-sided facial droop. EXAM: CT HEAD WITHOUT CONTRAST TECHNIQUE: Contiguous axial images were obtained from the base of the skull through the vertex without intravenous contrast. RADIATION DOSE REDUCTION: This exam was performed according to the departmental dose-optimization program which includes automated exposure control, adjustment of the mA and/or kV according to patient size and/or use of iterative reconstruction technique. COMPARISON:  Head MRI 08/03/2018 FINDINGS: Brain: There is evidence of an acute left MCA territory infarct involving portions of the frontal and parietal lobes (most notably at the level of the operculum), insula, caudate, lentiform nucleus, and likely internal capsule. No acute intracranial hemorrhage, significant midline shift, hydrocephalus, or extra-axial fluid collection is identified. Cerebral white matter  hypodensities elsewhere are nonspecific but compatible with mild chronic small vessel ischemic disease. Cerebral volume is within normal limits for age. Vascular: Calcified atherosclerosis at the skull base. Hyperdense left M1 segment. Skull: No fracture or suspicious lesion. Sinuses/Orbits: Visualized paranasal sinuses and mastoid air cells are clear. Bilateral cataract extraction. Other: None. ASPECTS [facility] Stroke Program Early CT Score) - Ganglionic level infarction (caudate, lentiform nuclei, internal capsule, insula, M1-M3 cortex): 1 - Supraganglionic infarction (M4-M6 cortex): 2 Total score (0-10 with 10 being normal): 3 These results were communicated to Dr. Lindzen at 2:33 pm on 02/07/2024 by text page via the [facility] messaging system. IMPRESSION: Acute left MCA territory infarct. ASPECTS of 3. Electronically Signed   By: Aundra Lee M.D.   On: 02/07/2024 14:33   EKG: Independently reviewed.  Atrial fibrillation at 84 bpm.  Nonspecific T wave changes.  Minimal baseline wander.  QTc borderline at 497.  Assessment/Plan Principal Problem:   Acute CVA (cerebrovascular accident) [facility]) Active Problems:   Essential hypertension   CAD, ARTERY BYPASS GRAFT   Anxiety and depression   Hyperlipidemia   Chronic diastolic CHF (congestive heart failure) (HCC)   Carotid stenosis   Spinal stenosis of lumbar region   Neuropathy   GERD (gastroesophageal reflux disease)   Anemia   Chronic obstructive pulmonary disease with acute exacerbation (HCC)   Atrial fibrillation (HCC)   Acute CVA > Patient presenting after a fall this morning and then noted later by husband to be unable to move her right side and to not be speaking. > CT scan with large left MCA infarct.  MRI brain ordered. > Seen by neurology and patient made DNR/DNI, but family wants continued workup otherwise.  Prognosis is not good but will gather more information and speak further with family while admitted. - Appreciate neurology  recommendations and assistance - Allow for permissive HTN (systolic < 220 and diastolic < 120)  - Daily ASA - Statin if tolerating p.o. - A1C  - Lipid panel  - Tele monitoring  - SLP eval - PT/OT  Hypertension - Present hypertension as above  Hyperlipidemia - Continue rosuvastatin  and Zetia  as tolerated  Carotid artery disease CAD, status post CABG - Continue ASA, rosuvastatin , Zetia  as tolerated - Holding antihypertensives as above  Atrial fibrillation - Holding metoprolol  as above - Not on anticoagulation  Chronic diastolic CHF > Last echo was in March with EF 60-65%, indeterminate diastolic function, normal RV function. - Holding Lasix  as above  Anxiety Depression - Continue sertraline  if able  Neuropathy - Continue  gabapentin  as able  COPD - Continue home Breo and albuterol    DVT prophylaxis: SCDs Code Status:   DNR/DNI Family Communication:  Updated at bedside Disposition Plan:   Patient is from:  Home  Anticipated DC to:  Pending clinical course  Anticipated DC date:  2 to 4 days  Anticipated DC barriers: If placement needed  Consults called:  Neurology Admission status:  Inpatient, telemetry  Severity of Illness: The appropriate patient status for this patient is INPATIENT. Inpatient status is judged to be reasonable and necessary in order to provide the required intensity of service to ensure the patient's safety. The patient's presenting symptoms, physical exam findings, and initial radiographic and laboratory data in the context of their chronic comorbidities is felt to place them at high risk for further clinical deterioration. Furthermore, it is not anticipated that the patient will be medically stable for discharge from the hospital within 2 midnights of admission.   * I certify that at the point of admission it is my clinical judgment that the patient will require inpatient hospital care spanning beyond 2 midnights from the point of admission due to  high intensity of service, high risk for further deterioration and high frequency of surveillance required.Johnetta Nab MD [facility]  How to contact the [facility] Attending or Consulting provider 7A - 7P or covering provider during after hours 7P -7A, for this patient?   Check the care team in [facility] and look for a) attending/consulting [facility] provider listed and b) the [facility] team listed Log into [email or web address] and use [place]'s universal password to access. If you do not have the password, please contact the hospital operator. Locate the [facility] provider you are looking for under [facility] and page to a number that you can be directly reached. If you still have difficulty reaching the provider, please page the [facility] (Director on Call) for the Hospitalists listed on amion for assistance.  02/07/2024, 4:28 PM

## 2024-02-07 NOTE — ED Notes (Signed)
 Patient arrives in CT with RN

## 2024-02-07 NOTE — Code Documentation (Addendum)
 Stroke Response Nurse Documentation Code Documentation  Catherine Green is a 83 y.o. female arriving to [facility]  via [facility] on 02/07/2024 with past medical hx of HLD, HTN, CAD s/p CABG. On aspirin  81 daily. Code stroke was activated by ED.   Patient from home where she was LKW at 0845 and now complaining of rt weakness, aphasia.   Stroke team at the bedside on patient arrival. Labs drawn and patient cleared for CT by  [facility]. Patient to CT with team. NIHSS 23 , see documentation for details and code stroke times. Patient with disoriented, not following commands, left gaze preference , right hemianopia, right facial droop, right arm weakness, bilateral leg weakness, and Global aphasia  on exam. The following imaging was completed:  CT Head, CTA, and CTP. Patient is not a candidate for IV Thrombolytic due to outside of treatment window. Patient is not a candidate for IR due to completed stroke on imaging..   Care Plan: q 2 VS and NIHSS x 12 then q 4.    Bedside handoff with ED RN complete.  Azalyn Sliwa Livengood  Stroke Response RN

## 2024-02-07 NOTE — IPAL (Signed)
  Interdisciplinary Goals of Care Family Meeting   Date carried out: 02/07/2024  Location of the meeting: ED  Member's involved: Physician, Nurse Practitioner, Family Member or next of kin, and Other: RN  Durable Power of Attorney or acting medical decision maker: Harrold Lincoln    Discussion: We discussed goals of care for [employer] .  Family states that they do have advanced directives stating that they do not want intubation or chest compressions done. They will try to find these and bring them to the hospital for us  to scan into the computer. They would like all other interventions/medical care at this time including medications. They reiterated that they do hope she will make a meaningful recovery, but remain comfortable in their decision to not pursue a mechanical thrombectomy.  They do continue to want standard medical therapy for the stroke at this time.  Code status: DNR/DNI  Disposition: Continue current acute care  Time spent for the meeting: 20 minutes   [name], NP  02/07/2024, 3:44 PM

## 2024-02-07 NOTE — ED Notes (Signed)
 Went to MRI

## 2024-02-07 NOTE — ED Notes (Signed)
 Airway cleared by PA Mariah Shines

## 2024-02-07 NOTE — Consult Note (Signed)
 NEUROLOGY CONSULT NOTE   Date of service: February 07, 2024 Patient Name: Catherine Green MRN:  [id number] DOB:  Mar 27, 1941 Chief Complaint: "Code Stroke" Requesting Provider: Guadalupe Lee, MD  History of Present Illness  Catherine Green is a 83 y.o. female with hx of coronary artery disease, hx of CEA on the right, depression, DVT, GERD, heart murmur, hyperlipidemia, hypertension, mitral regurgitation, PVD who is presenting with right-sided weakness and aphasia. Woke up in her usual state of health this morning. Her and her husband were in their condo and between 0830 and 0900 he heard her fall. He was able to help her up into a chair and then at 1330 he noted that she had not spoken to him since the fall and he called EMS. She does take ASA 81mg  daily. She does have a listed allergy to contrast, family is unable to elaborate so she was premedicated prior to CTA head and neck with solu medrol  and benadryl .   There was a delay as the code stroke was not paged out prior to patient's arrival in the page initially went to [facility] instead of [employer].  LKW: 0825 Modified rankin score: 3-Moderate disability-requires help but walks WITHOUT assistance IV Thrombolysis: No, outside of window EVT: No, family declined after discussion with Dr. Laverta Potters (neurointerventional radiologist), Dr. Renaee Caro (neurologist) due to the size of her infarct and aspects of 3.  NIHSS components Score: Comment  1a Level of Conscious 0[x]  1[]  2[]  3[]      1b LOC Questions 0[]  1[]  2[x]       1c LOC Commands 0[]  1[]  2[x]       2 Best Gaze 0[]  1[]  2[x]       3 Visual 0[]  1[]  2[x]  3[]      4 Facial Palsy 0[]  1[]  2[x]  3[]      5a Motor Arm - left 0[x]  1[]  2[]  3[]  4[]  UN[]    5b Motor Arm - Right 0[]  1[]  2[]  3[x]  4[]  UN[]    6a Motor Leg - Left 0[]  1[]  2[x]  3[]  4[]  UN[]    6b Motor Leg - Right 0[]  1[]  2[]  3[x]  4[]  UN[]    7 Limb Ataxia 0[x]  1[]  2[]  UN[]      8 Sensory 0[x]  1[]  2[]  UN[]      9 Best Language 0[]  1[]  2[]  3[x]      10  Dysarthria 0[]  1[]  2[x]  UN[]      11 Extinct. and Inattention 0[x]  1[]  2[]       TOTAL:23       ROS   Unable to ascertain due to aphasia  Past History   Past Medical History:  Diagnosis Date   Allergy    hay fever   Anemia    Anxiety    Back pain    Carotid artery occlusion    Cerebrovascular disease    extracranial; occlusive   Chicken pox    Coronary artery disease    Depression    Dizziness    Dizziness    DVT (deep venous thrombosis) (HCC)    Fainting spell    GERD (gastroesophageal reflux disease)    Headache    Heart murmur    Hyperlipidemia    Hypertension    Leg pain    Mitral regurgitation    PONV (postoperative nausea and vomiting)    severe nausea and vomiting   Pre-syncope    PVD (peripheral vascular disease) (HCC)    endarterectomy by Dr. Shirley Douglas   Seasonal allergies    Shortness of breath dyspnea    wth ambulation at  times   Swelling of both ankles    and abdomen; takes Lasix  when needed   Thrombophlebitis    following childbirth   Ulcer     Past Surgical History:  Procedure Laterality Date   ANTERIOR CERVICAL DECOMP/DISCECTOMY FUSION N/A 01/11/2023   Procedure: Anterior Cervical Decompression/Discectomy Fusion Cervical Five-Cervical Six - Cervical Six-Cervical Seven;  Surgeon: Agustina Aldrich, MD;  Location: [facility] OR;  Service: Neurosurgery;  Laterality: N/A;  3C   APPENDECTOMY     BREAST SURGERY     saline implants   CARDIAC CATHETERIZATION     CAROTID ENDARTERECTOMY  1992   CAROTID ENDARTERECTOMY Right 02/13/2005   Re-do Right CE   CATARACT EXTRACTION, BILATERAL  2013   CHOLECYSTECTOMY, LAPAROSCOPIC  04/02/2014   Dr. Hortensia Ma   CORONARY ARTERY BYPASS GRAFT  2006   x3 Dr. Matt Song   ENDARTERECTOMY Left 07/20/2014   Procedure: ENDARTERECTOMY CAROTID-LEFT;  Surgeon: Mayo Speck, MD;  Location: [facility] OR;  Service: Vascular;  Laterality: Left;   EYE SURGERY Bilateral 10/2011   Cataract Left eye   SEPTOPLASTY     with bilateral inferior  turbinate reductions   SPINE SURGERY  12/2015   TOTAL ABDOMINAL HYSTERECTOMY     UPPER GASTROINTESTINAL ENDOSCOPY     had polyps removed from esophagus   UPPER GI ENDOSCOPY      Family History: Family History  Problem Relation Age of Onset   Aneurysm Mother        brain   Heart disease Mother        Aneyursm    Hyperlipidemia Mother    Hypertension Mother    Varicose Veins Mother    Bleeding Disorder Mother    Heart disease Father    Cirrhosis Father    Heart attack Father    Hyperlipidemia Father    Hypertension Father    Cancer Sister        lung   Heart disease Sister        Aneurysm   Hyperlipidemia Sister    Hypertension Sister    Varicose Veins Sister    Bleeding Disorder Sister    Heart disease Brother        Before age 49   Aneurysm Brother    Deep vein thrombosis Brother    Birth defects Brother    Hyperlipidemia Brother    Hypertension Brother    Peripheral vascular disease Daughter    Hyperlipidemia Daughter    Hypertension Daughter    Hypertension Other    Breast cancer Neg Hx    Colon cancer Neg Hx     Social History  reports that she quit smoking about 27 years ago. Her smoking use included cigarettes. She started smoking about 42 years ago. She has a 4.5 pack-year smoking history. She has never been exposed to tobacco smoke. She has never used smokeless tobacco. She reports that she does not drink alcohol and does not use drugs.  Allergies  Allergen Reactions   Lyrica  [Pregabalin ] Shortness Of Breath, Swelling and Other (See Comments)    chest tight   Hydrocodone  Other (See Comments)    hallucinate   Ivp Dye [Iodinated Contrast Media] Hives and Itching        Lipitor [Atorvastatin] Nausea And Vomiting   Methylprednisolone  Other (See Comments)    Severe weekness    Oxycodone  Hcl Other (See Comments)    Hallucinations    Penicillin G Nausea And Vomiting   Shellfish Allergy Hives and Itching  Buspar  [Buspirone ]     Worsening mood    Clavulanic Acid    Prednisone     Codeine Nausea And Vomiting and Other (See Comments)   Cymbalta  [Duloxetine  Hcl] Nausea Only    GI upset at 30mg    Demerol Nausea And Vomiting   Doxycycline  Nausea Only and Other (See Comments)    Weakness, sick to her stomach   Effexor  [Venlafaxine ] Nausea Only   Hydrochlorothiazide  Other (See Comments)    Not an allergy but urinary frequency and sweating at 25mg    Hydroxyzine  Other (See Comments)    Excessive sweating.    Metoprolol  Other (See Comments)    Slowed body down, per pt    Protonix  [Pantoprazole  Sodium] Nausea Only   Trazodone  And Nefazodone Other (See Comments)    Sedation     Medications   Current Facility-Administered Medications:    methylPREDNISolone  sodium succinate (SOLU-MEDROL ) 125 mg/2 mL injection 125 mg, 125 mg, Intravenous, Once, Dela Favor, Devon, NP   sodium chloride  flush (NS) 0.9 % injection 3 mL, 3 mL, Intravenous, Once, Guadalupe Lee, MD  Current Outpatient Medications:    albuterol  (VENTOLIN  HFA) 108 (90 Base) MCG/ACT inhaler, Inhale 2 puffs into the lungs every 6 (six) hours as needed for wheezing or shortness of breath., Disp: 8 g, Rfl: 3   amLODipine  (NORVASC ) 2.5 MG tablet, Take 1 tablet (2.5 mg total) by mouth in the morning., Disp: 90 tablet, Rfl: 3   aspirin  81 MG tablet, Take 1 tablet (81 mg total) by mouth daily., Disp: 30 tablet, Rfl:    Coenzyme Q10 (CO Q10) 100 MG CAPS, Take 100 mg by mouth daily., Disp: , Rfl:    empagliflozin  (JARDIANCE ) 10 MG TABS tablet, Take 1 tablet (10 mg total) by mouth daily before breakfast., Disp: , Rfl:    ezetimibe  (ZETIA ) 10 MG tablet, Take 1 tablet (10 mg total) by mouth daily., Disp: 90 tablet, Rfl: 3   furosemide  (LASIX ) 40 MG tablet, Take 1 tablet (40 mg total) by mouth daily., Disp: 30 tablet, Rfl: 5   gabapentin  (NEURONTIN ) 400 MG capsule, Take 1 capsule (400 mg total) by mouth 3 (three) times daily., Disp: , Rfl:    ipratropium (ATROVENT) 0.03 % nasal spray, 2 sprays as  needed., Disp: , Rfl:    loratadine  (CLARITIN ) 10 MG tablet, Take 10 mg by mouth daily., Disp: , Rfl:    metoprolol  succinate (TOPROL -XL) 25 MG 24 hr tablet, TAKE 1/2 TABLET AT BEDTIME, Disp: 45 tablet, Rfl: 3   omeprazole  (PRILOSEC) 20 MG capsule, Take 1 capsule (20 mg total) by mouth 2 (two) times daily before a meal., Disp: , Rfl:    polyethylene glycol powder (GLYCOLAX /MIRALAX ) 17 GM/SCOOP powder, Take 17 g by mouth 2 (two) times daily as needed., Disp: 850 g, Rfl: 0   potassium chloride  SA (KLOR-CON  M) 20 MEQ tablet, Take 2 tablets (40 mEq total) by mouth daily., Disp: 60 tablet, Rfl: 5   rosuvastatin  (CRESTOR ) 20 MG tablet, Take 1 tablet (20 mg total) by mouth daily. (Patient not taking: Reported on 01/28/2024), Disp: 90 tablet, Rfl: 3   sertraline  (ZOLOFT ) 100 MG tablet, TAKE ONE AND A HALF TABLETS BY MOUTH EVERY DAY, Disp: 150 tablet, Rfl: 2   vitamin B-12 (CYANOCOBALAMIN ) 1000 MCG tablet, Take 1 tablet (1,000 mcg total) by mouth daily., Disp: , Rfl:   Vitals   Vitals:   02/07/24 1400  Weight: 62.4 kg    Body mass index is 25.16 kg/m.   Physical Exam   Constitutional:  Appears well-developed and well-nourished.  Psych: Affect appropriate to situation.  Eyes: No scleral injection.  HENT: No OP obstruction.  Head: Normocephalic.  Cardiovascular: Normal rate and regular rhythm.  Respiratory: Effort normal, non-labored breathing.  GI: Soft.  No distension. There is no tenderness.  Skin: WDI.   Neurologic Examination   Mental Status: Patient is awake, non verbal. Dense receptive and expressive aphasia. Right hemineglect.  Cranial Nerves: II: Pupils are equal, round, and reactive to light.  Right hemianopia  III,IV, VI: Left gaze, comes to midline but does not cross to the right.   SWF:UXNAT facial paralysis  VIII: Hearing is intact to voice X: Gag reflex deferred XI: Head preferentially rotated to the right XII:  Motor: Bulk is normal.  RUE 1/5  LUE 5/5 RLE 1/5  LLE  3/5 Sensory: Grimaces with painful stimuli in all extremities  Cerebellar: No gross ataxia noted on the left. Unable to complete FNF or HKS.  Gait: Deferred    Labs/Imaging/Neurodiagnostic studies   CBC:  Recent Labs  Lab 2024-02-20 1419 2024/02/20 1437 2024-02-20 1451  WBC 11.4*  --   --   NEUTROABS 9.8*  --   --   HGB 12.8 13.9 14.6  HCT 40.8 41.0 43.0  MCV 86.4  --   --   PLT 281  --   --    Basic Metabolic Panel:  Lab Results  Component Value Date   NA 138 February 20, 2024   K 3.6 02/20/2024   CO2 23 12/10/2023   GLUCOSE 225 (H) 02-20-24   BUN 16 2024/02/20   CREATININE 0.90 20-Feb-2024   CALCIUM  9.6 12/10/2023   GFRNONAA >60 11/12/2023   GFRAA 73 07/29/2020   Lipid Panel:  Lab Results  Component Value Date   LDLCALC 61 07/17/2022   HgbA1c:  Lab Results  Component Value Date   HGBA1C 5.8 (A) 02/07/2021   Urine Drug Screen: No results found for: "LABOPIA", "COCAINSCRNUR", "LABBENZ", "AMPHETMU", "THCU", "LABBARB"  Alcohol Level No results found for: "ETH" INR  Lab Results  Component Value Date   INR 1.02 07/13/2014   APTT  Lab Results  Component Value Date   APTT 33 07/13/2014   AED levels: No results found for: "PHENYTOIN", "ZONISAMIDE", "LAMOTRIGINE", "LEVETIRACETA"  CT Head without contrast (Personally reviewed): Acute left MCA territory infarct. ASPECTS of 3.   CT angio Head and Neck with contrast (Personally reviewed): 1. 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 2. Limited CT perfusion imaging as described above. 3. 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 4. Unchanged 50% stenosis of the origin of the left common carotid artery and 75% stenosis of the brachiocephalic artery. 5. Partially visualized small to moderate bilateral pleural effusions. 6.  Aortic Atherosclerosis   11/10/2023 Echocardiogram: EF 60-65%   ASSESSMENT   BRAYLIN FORMBY is an  83 y.o. female presenting with right hemiplegia and aphasia.  Code stroke was activated in the ED and she was found to have an occlusion of the left ICA and proximal left M1 and A1 segments.  Extensive discussion between neurology, neurointerventional radiology, and family regarding the risks and benefits of proceeding with a mechanical thrombectomy.  Family declined procedure and on further conversation stated that they do have living wills and Ms. Mercadel did express in her living will to be DNR, DNI.  Family will attempt to locate this and bring it in for scanning.  CODE STATUS changed and conversation documented.  Family is in agreement for full medical treatment.  Aspirin  300 mg PR ordered.  Permissive hypertension up to BP 180.  Plan to admit to hospitalist service.  CT head in 12 hours and MRI pending  RECOMMENDATIONS  - HgbA1c, fasting lipid panel - MRI of the brain without contrast - Repeat Head CT in 12 hours  - Frequent neuro checks - Prophylactic therapy-Antiplatelet med: Aspirin  300mg  PR - Permissive hypertension. Treat if SBP > 180 - Risk factor modification - Telemetry monitoring - PT consult, OT consult, Speech consult - Stroke team to follow  ______________________________________________________________________    Signed, [name], NP [facility]  I have seen and examined the patient. I have formulated the assessment and recommendations. 83 year old female presenting with acute onset of right hemiparesis, right hemineglect and global aphasia. NIHSS 23. Imaging reveals left MCA territory completed infarction. Family declined thrombectomy after extensive discussion of risks/benefits. Recommendations as above.  Electronically signed: Dr. Teal Bontrager

## 2024-02-07 NOTE — ED Notes (Signed)
 Neuro NP in CT

## 2024-02-07 NOTE — ED Notes (Signed)
 Received report. Pt resting quietly on stretcher.

## 2024-02-07 NOTE — ED Provider Notes (Signed)
 [facility] Green Provider Note   CSN: [id number] Arrival date & time: 02/07/24  1413  An emergency department physician performed an initial assessment on this suspected stroke patient at 1420.  History  Chief Complaint  Patient presents with   Code Stroke    Catherine Green is a 83 y.o. female.  HPI Patient arrives as code stroke.  The patient is coming from home.  Patient awakened to normal state of health this morning.  She was folding laundry and at approximately 9 AM patient's husband heard her fall.  He went to her assistance and was able to get her to a chair.  Then at approximately 130 he noted that she was not able to speak and then she could not move the right side of her body.    Home Medications Prior to Admission medications   Medication Sig Start Date End Date Taking? Authorizing Provider  albuterol  (VENTOLIN  HFA) 108 (90 Base) MCG/ACT inhaler Inhale 2 puffs into the lungs every 6 (six) hours as needed for wheezing or shortness of breath. 10/26/22  Yes Donnie Galea, MD  amLODipine  (NORVASC ) 2.5 MG tablet Take 1 tablet (2.5 mg total) by mouth in the morning. 12/20/23  Yes Gollan, Timothy J, MD  aspirin  81 MG tablet Take 1 tablet (81 mg total) by mouth daily. 03/29/18  Yes Gollan, Timothy J, MD  BREO ELLIPTA  100-25 MCG/ACT AEPB Inhale 1 puff into the lungs daily.   Yes [provider]  Coenzyme Q10 (CO Q10) 100 MG CAPS Take 100 mg by mouth daily.   Yes [provider]  empagliflozin  (JARDIANCE ) 10 MG TABS tablet Take 1 tablet (10 mg total) by mouth daily before breakfast. 12/10/23  Yes Shawnee Dellen A, FNP  ezetimibe  (ZETIA ) 10 MG tablet Take 1 tablet (10 mg total) by mouth daily. 12/20/23  Yes Gollan, Timothy J, MD  furosemide  (LASIX ) 40 MG tablet Take 1 tablet (40 mg total) by mouth daily. 12/03/23  Yes Donnie Galea, MD  gabapentin  (NEURONTIN ) 400 MG capsule Take 1 capsule (400 mg total) by mouth 3 (three) times  daily. 11/12/23  Yes Shah, Vipul, MD  ipratropium (ATROVENT) 0.03 % nasal spray Place 2 sprays into both nostrils 2 (two) times daily as needed for rhinitis. 05/26/23  Yes [provider]  loratadine  (CLARITIN ) 10 MG tablet Take 10 mg by mouth daily.   Yes [provider]  metoprolol  succinate (TOPROL -XL) 25 MG 24 hr tablet TAKE 1/2 TABLET AT BEDTIME 01/10/24  Yes Gollan, Timothy J, MD  potassium chloride  SA (KLOR-CON  M) 20 MEQ tablet Take 2 tablets (40 mEq total) by mouth daily. 12/03/23  Yes Donnie Galea, MD  rosuvastatin  (CRESTOR ) 20 MG tablet Take 1 tablet (20 mg total) by mouth daily. 12/20/23  Yes Gollan, Timothy J, MD  vitamin B-12 (CYANOCOBALAMIN ) 1000 MCG tablet Take 1 tablet (1,000 mcg total) by mouth daily. 04/24/18  Yes Donnie Galea, MD  omeprazole  (PRILOSEC) 20 MG capsule Take 1 capsule (20 mg total) by mouth 2 (two) times daily before a meal. Patient not taking: Reported on 02/07/2024 02/05/22   Donnie Galea, MD  polyethylene glycol powder (GLYCOLAX /MIRALAX ) 17 GM/SCOOP powder Take 17 g by mouth 2 (two) times daily as needed. 10/26/22   Donnie Galea, MD  sertraline  (ZOLOFT ) 100 MG tablet TAKE ONE AND A HALF TABLETS BY MOUTH EVERY DAY Patient taking differently: Take 150 mg by mouth daily. 12/02/23   Donnie Galea,  MD      Allergies    Lyrica  [pregabalin ], Hydrocodone , Ivp dye [iodinated contrast media], Lipitor [atorvastatin], Methylprednisolone , Oxycodone  hcl, Penicillin g, Shellfish allergy, Buspar  [buspirone ], Clavulanic acid, Prednisone , Codeine, Cymbalta  [duloxetine  hcl], Demerol, Doxycycline , Effexor  [venlafaxine ], Hydrochlorothiazide , Hydroxyzine , Metoprolol , Protonix  [pantoprazole  sodium], and Trazodone  and nefazodone    Review of Systems   Review of Systems  Physical Exam Updated Vital Signs BP (!) 168/74   Pulse 83   Temp (!) 96.8 F (36 C) (Axillary)   Resp 17   Ht 5\' 2"  (1.575 m)   Wt 62.4 kg   SpO2 95%   BMI 25.16 kg/m  Physical  Exam Constitutional:      Comments: Patient is lying supine.  She has significant left gaze deviation.  No respiratory distress.    HENT:     Head: Normocephalic and atraumatic.     Mouth/Throat:     Pharynx: Oropharynx is clear.  Eyes:     Comments: Gaze deviation to the left.  Cardiovascular:     Rate and Rhythm: Normal rate and regular rhythm.  Pulmonary:     Effort: Pulmonary effort is normal.     Breath sounds: Normal breath sounds.  Abdominal:     General: There is no distension.     Palpations: Abdomen is soft.  Musculoskeletal:     Comments: No lower extremity swelling.  Calves are soft and pliable.  Condition of lower legs is very good.  Skin:    General: Skin is warm and dry.  Neurological:     Comments: Patient is nonverbal at this time.  She has significant left gaze deviation.  She does not follow commands for spontaneous movements.  She is making some purposeful or attempted movement of the left lower extremity as I am performing exam.     ED Results / Procedures / Treatments   Labs (all labs ordered are listed, but only abnormal results are displayed) Labs Reviewed  CBC - Abnormal; Notable for the following components:      Result Value   WBC 11.4 (*)    All other components within normal limits  DIFFERENTIAL - Abnormal; Notable for the following components:   Neutro Abs 9.8 (*)    All other components within normal limits  COMPREHENSIVE METABOLIC PANEL WITH GFR - Abnormal; Notable for the following components:   Glucose, Bld 142 (*)    Calcium  8.6 (*)    All other components within normal limits  I-STAT CHEM 8, ED - Abnormal; Notable for the following components:   Glucose, Bld 143 (*)    Calcium , Ion 1.14 (*)    All other components within normal limits  CBG MONITORING, ED - Abnormal; Notable for the following components:   Glucose-Capillary 132 (*)    All other components within normal limits  I-STAT CHEM 8, ED - Abnormal; Notable for the following  components:   Glucose, Bld 225 (*)    Calcium , Ion 1.12 (*)    All other components within normal limits  SARS CORONAVIRUS 2 BY RT PCR  PROTIME-INR  APTT  ETHANOL  HEMOGLOBIN A1C  I-STAT CG4 LACTIC ACID, ED    EKG EKG Interpretation Date/Time:  Monday February 07 2024 15:19:21 EDT Ventricular Rate:  84 PR Interval:    QRS Duration:  87 QT Interval:  420 QTC Calculation: 497 R Axis:   100  Text Interpretation: Atrial fibrillation Right axis deviation Abnormal lateral Q waves Anteroseptal infarct, old Borderline ST depression, anterior leads agree, no sig  chnage from previous Confirmed by Wynetta Heckle [phone]) on 02/07/2024 4:04:50 PM  Radiology CT ANGIO HEAD NECK W WO CM W PERF (CODE STROKE) Result Date: 02/07/2024 CLINICAL DATA:  Neuro deficit, acute, stroke suspected. EXAM: CT ANGIOGRAPHY HEAD AND NECK CT PERFUSION BRAIN TECHNIQUE: Multidetector CT imaging of the head and neck was performed using the standard protocol during bolus administration of intravenous contrast. Multiplanar CT image reconstructions and MIPs were obtained to evaluate the vascular anatomy. Carotid stenosis measurements (when applicable) are obtained utilizing NASCET criteria, using the distal internal carotid diameter as the denominator. Multiphase CT imaging of the brain was performed following IV bolus contrast injection. Subsequent parametric perfusion maps were calculated using RAPID software. RADIATION DOSE REDUCTION: This exam was performed according to the departmental dose-optimization program which includes automated exposure control, adjustment of the mA and/or kV according to patient size and/or use of iterative reconstruction technique. CONTRAST:  OMNIPAQUE IOHEXOL 350 MG/ML SOLN COMPARISON:  CTA neck 04/22/2020 FINDINGS: CTA NECK FINDINGS Aortic arch: Standard branching with prominent calcified plaque involving the great vessel origins including a proximally 75% stenosis of the origin of the  brachiocephalic artery, similar to the prior CTA. 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 Similar appearance of calcified and ulcerated soft plaque in this region. Widely patent ICA. Left carotid system: Patent with 50% stenosis of the common carotid artery origin and less than 50% stenosis of the proximal ICA, similar to the prior CTA. Vertebral arteries: Patent without evidence of a significant stenosis or dissection within limitation of streak artifact which obscures a portion of the right V1 segment. Element left vertebral artery. Skeleton: C5-C7 ACDF. Advanced upper cervical facet arthrosis. C3-4 facet ankylosis. Other neck: No evidence of cervical lymphadenopathy or mass. Upper chest: Partially visualized small to moderate bilateral pleural effusions with associated atelectasis. Status post CABG. Review of the MIP images confirms the above findings CTA HEAD FINDINGS Anterior circulation: The intracranial left ICA is patent proximally, however there is progressively diminished opacification more distally with the vessel appearing occluded beginning in the cavernous segment through the terminus. The left M1 and proximal left A1 segments are also occluded. There is reconstitution of the left ACA via the anterior communicating artery. There is mild-to-moderate reconstitution of the distal left M1 segment and left MCA branch vessels. The intracranial right ICA is patent with mild nonstenotic atherosclerosis. The right ACA and right MCA are patent without evidence of a significant proximal stenosis. No aneurysm is identified. Posterior circulation: The intracranial vertebral arteries are patent to the basilar with the right being hypoplastic distal to the PICA origin. Patent PICA and SCA origins are visualized bilaterally. The basilar artery is patent and mildly  irregular without a significant stenosis. Posterior communicating arteries are diminutive or absent. Both PCAs are patent without evidence of a significant proximal stenosis. No aneurysm is identified. Venous sinuses: As permitted by contrast timing, patent. Anatomic variants: None. Review of the MIP images confirms the above findings CT Brain Perfusion Findings: CT perfusion imaging is felt to be of limited diagnostic utility due to bolus timing, with the arterial upstroke being incompletely captured and without a good venous outflow curve. The perfusion software does not detect the large left MCA infarct which is apparent on the earlier noncontrast head CT and reports 22 mL of mismatch volume. These results were communicated to Dr. Lindzen at 3:21 pm on  02/07/2024 by text page via the [facility] messaging system. IMPRESSION: 1. 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 2. Limited CT perfusion imaging as described above. 3. 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 4. Unchanged 50% stenosis of the origin of the left common carotid artery and 75% stenosis of the brachiocephalic artery. 5. Partially visualized small to moderate bilateral pleural effusions. 6.  Aortic Atherosclerosis (ICD10-I70.0). Electronically Signed   By: Aundra Lee M.D.   On: 02/07/2024 15:40   CT HEAD CODE STROKE WO CONTRAST Result Date: 02/07/2024 CLINICAL DATA:  Code stroke. Neuro deficit, acute, stroke suspected. Fall this morning. Altered mental status. Aphasia, right-sided deficits, and left-sided facial droop. EXAM: CT HEAD WITHOUT CONTRAST TECHNIQUE: Contiguous axial images were obtained from the base of the skull through the vertex without intravenous contrast. RADIATION DOSE REDUCTION: This exam was performed according to the departmental dose-optimization program which includes automated exposure control, adjustment of the mA and/or kV  according to patient size and/or use of iterative reconstruction technique. COMPARISON:  Head MRI 08/03/2018 FINDINGS: Brain: There is evidence of an acute left MCA territory infarct involving portions of the frontal and parietal lobes (most notably at the level of the operculum), insula, caudate, lentiform nucleus, and likely internal capsule. No acute intracranial hemorrhage, significant midline shift, hydrocephalus, or extra-axial fluid collection is identified. Cerebral white matter hypodensities elsewhere are nonspecific but compatible with mild chronic small vessel ischemic disease. Cerebral volume is within normal limits for age. Vascular: Calcified atherosclerosis at the skull base. Hyperdense left M1 segment. Skull: No fracture or suspicious lesion. Sinuses/Orbits: Visualized paranasal sinuses and mastoid air cells are clear. Bilateral cataract extraction. Other: None. ASPECTS [facility] Stroke Program Early CT Score) - Ganglionic level infarction (caudate, lentiform nuclei, internal capsule, insula, M1-M3 cortex): 1 - Supraganglionic infarction (M4-M6 cortex): 2 Total score (0-10 with 10 being normal): 3 These results were communicated to Dr. Lindzen at 2:33 pm on 02/07/2024 by text page via the [facility] messaging system. IMPRESSION: Acute left MCA territory infarct. ASPECTS of 3. Electronically Signed   By: Aundra Lee M.D.   On: 02/07/2024 14:33    Procedures Procedures    Medications Ordered in ED Medications   stroke: early stages of recovery book (has no administration in time range)  aspirin  suppository 300 mg (has no administration in time range)  sodium chloride  flush (NS) 0.9 % injection 3 mL (3 mLs Intravenous Given 02/07/24 1500)  diphenhydrAMINE  (BENADRYL ) injection 25 mg (25 mg Intravenous Given 02/07/24 1454)  methylPREDNISolone  sodium succinate (SOLU-MEDROL ) 125 mg/2 mL injection 125 mg (125 mg Intravenous Given 02/07/24 1455)  iohexol (OMNIPAQUE) 350 MG/ML injection 100 mL (100 mLs  Intravenous Contrast Given 02/07/24 1504)    ED Course/ Medical Decision Making/ A&P                                 Medical Decision Making Amount and/or Complexity of Data Reviewed Labs: ordered. Radiology: ordered. ECG/medicine tests: ordered.   Patient presents and is evaluated for code stroke.  Immediately evaluated by neurology and has identified a large territory occlusion of left ICA and proximal left A1 and M1 segments.  I have directly reviewed the case with neurology NP [name].  At this time plan will be admission to medical service with comfort care.  She has already reviewed with the patient plan of care and  patient will be made DNR at this time with conservative medical management.        Final Clinical Impression(s) / ED Diagnoses Final diagnoses:  Cerebrovascular accident (CVA) due to occlusion of cerebral artery [facility])    Rx / DC Orders ED Discharge Orders     None         Wynetta Heckle, MD 02/07/24 [id number]

## 2024-02-08 ENCOUNTER — Other Ambulatory Visit (HOSPITAL_COMMUNITY): Payer: Self-pay

## 2024-02-08 ENCOUNTER — Inpatient Hospital Stay (HOSPITAL_COMMUNITY)

## 2024-02-08 DIAGNOSIS — I2510 Atherosclerotic heart disease of native coronary artery without angina pectoris: Secondary | ICD-10-CM | POA: Diagnosis not present

## 2024-02-08 DIAGNOSIS — D72829 Elevated white blood cell count, unspecified: Secondary | ICD-10-CM

## 2024-02-08 DIAGNOSIS — I779 Disorder of arteries and arterioles, unspecified: Secondary | ICD-10-CM

## 2024-02-08 DIAGNOSIS — Z7982 Long term (current) use of aspirin: Secondary | ICD-10-CM

## 2024-02-08 DIAGNOSIS — R1310 Dysphagia, unspecified: Secondary | ICD-10-CM

## 2024-02-08 DIAGNOSIS — Z86718 Personal history of other venous thrombosis and embolism: Secondary | ICD-10-CM

## 2024-02-08 DIAGNOSIS — I63412 Cerebral infarction due to embolism of left middle cerebral artery: Secondary | ICD-10-CM | POA: Diagnosis not present

## 2024-02-08 DIAGNOSIS — E785 Hyperlipidemia, unspecified: Secondary | ICD-10-CM

## 2024-02-08 DIAGNOSIS — I639 Cerebral infarction, unspecified: Secondary | ICD-10-CM

## 2024-02-08 DIAGNOSIS — I739 Peripheral vascular disease, unspecified: Secondary | ICD-10-CM

## 2024-02-08 LAB — COMPREHENSIVE METABOLIC PANEL WITH GFR
ALT: 15 U/L (ref 0–44)
AST: 25 U/L (ref 15–41)
Albumin: 4 g/dL (ref 3.5–5.0)
Alkaline Phosphatase: 110 U/L (ref 38–126)
Anion gap: 14 (ref 5–15)
BUN: 16 mg/dL (ref 8–23)
CO2: 25 mmol/L (ref 22–32)
Calcium: 9.3 mg/dL (ref 8.9–10.3)
Chloride: 102 mmol/L (ref 98–111)
Creatinine, Ser: 1.1 mg/dL — ABNORMAL HIGH (ref 0.44–1.00)
GFR, Estimated: 50 mL/min — ABNORMAL LOW (ref >60)
Glucose, Bld: 154 mg/dL — ABNORMAL HIGH (ref 70–99)
Potassium: 3.9 mmol/L (ref 3.5–5.1)
Sodium: 141 mmol/L (ref 135–145)
Total Bilirubin: 0.7 mg/dL (ref 0.0–1.2)
Total Protein: 7.1 g/dL (ref 6.5–8.1)

## 2024-02-08 LAB — CBC
HCT: 41.9 % (ref 36.0–46.0)
Hemoglobin: 13.4 g/dL (ref 12.0–15.0)
MCH: 26.9 pg (ref 26.0–34.0)
MCHC: 32 g/dL (ref 30.0–36.0)
MCV: 84 fL (ref 80.0–100.0)
Platelets: 305 10*3/uL (ref 150–400)
RBC: 4.99 MIL/uL (ref 3.87–5.11)
RDW: 14.1 % (ref 11.5–15.5)
WBC: 7.2 10*3/uL (ref 4.0–10.5)
nRBC: 0 % (ref 0.0–0.2)

## 2024-02-08 LAB — LIPID PANEL
Cholesterol: 283 mg/dL — ABNORMAL HIGH (ref 0–200)
HDL: 70 mg/dL (ref >40)
LDL Cholesterol: 199 mg/dL — ABNORMAL HIGH (ref 0–99)
Total CHOL/HDL Ratio: 4 ratio
Triglycerides: 71 mg/dL (ref <150)
VLDL: 14 mg/dL (ref 0–40)

## 2024-02-08 LAB — MAGNESIUM: Magnesium: 2.5 mg/dL — ABNORMAL HIGH (ref 1.7–2.4)

## 2024-02-08 MED ORDER — SODIUM CHLORIDE 0.9 % IV SOLN: INTRAVENOUS | Status: AC

## 2024-02-08 MED ORDER — ASPIRIN 300 MG RE SUPP
300.0000 mg | Freq: Every day | RECTAL | Status: DC
  Administered 2024-02-08 – 2024-02-09 (×2): 300 mg via RECTAL
  Filled 2024-02-08 (×2): qty 1

## 2024-02-08 MED ORDER — SODIUM CHLORIDE 0.9 % IV SOLN: INTRAVENOUS | Status: DC

## 2024-02-08 MED ORDER — ASPIRIN 81 MG PO TBEC: 81.0000 mg | DELAYED_RELEASE_TABLET | Freq: Every day | ORAL | Status: DC

## 2024-02-08 MED ORDER — ROSUVASTATIN CALCIUM 20 MG PO TABS
40.0000 mg | ORAL_TABLET | Freq: Every day | ORAL | Status: DC
  Administered 2024-02-09 – 2024-02-18 (×10): 40 mg via ORAL
  Filled 2024-02-08 (×10): qty 2

## 2024-02-08 MED ORDER — HEPARIN SODIUM (PORCINE) 5000 UNIT/ML IJ SOLN
5000.0000 [IU] | Freq: Three times a day (TID) | INTRAMUSCULAR | Status: DC
  Administered 2024-02-08 – 2024-02-09 (×3): 5000 [IU] via SUBCUTANEOUS
  Filled 2024-02-08 (×4): qty 1

## 2024-02-08 NOTE — Progress Notes (Signed)
 Bilateral lower extremity venous duplex has been completed. Preliminary results can be found in CV Proc through chart review.   02/08/24 12:02 PM Birda Buffy RVT

## 2024-02-08 NOTE — Consult Note (Addendum)
 Cardiology Consultation  Patient ID: Catherine Green MRN: [id number]; DOB: 1941/08/29  Admit date: 02/07/2024 Date of Consult: 02/08/2024  PCP:  Donnie Galea, MD   [facility] Providers Cardiologist:  Belva Boyden, MD    Patient Profile: Catherine Green is a 83 y.o. female with a hx of 3-vessel CAD s/p CABG (2006), HFpEF, bilateral carotid artery disease s/p bilateral CEA followed by vascular, hypertension, hyperlipidemia, frequent PVCs, atrial tachycardia, former smoker, who is being seen 02/08/2024 for the evaluation of atrial fibrillation at the request of Dr. Zelda Hickman.  History of Present Illness: Catherine Green has past medical history as stated above. She arrived to [facility] ED on 02/07/2024 as a Code Stroke. She reported that she woke up that morning, feeling normal, then around 9 AM her husband heard her fall. She was then able to get to a chair, then around 1:30 PM she was not able to speak or move the right side of her body. They activated EMS who brought her to the hospital.  While in the ED, her SBP was 160-170s, glucose was 142, CBC showed leukocytosis 11.4, CT head showed acute left MCA infarct. CTA head/neck showed occlusive disease at left intracranial ICA, left A1, left M1, unchanged since fusiform aneurysmal dilation of right carotid, stable 50% left carotid stenosis, stable 75% radiocephalic stenosis.  Patient was given ASA, Solu-Medrol , Benadryl  while in the ED. Neurology was consulted. During their evaluation, today, she was lethargic and drowsy and only opened eyes to voice. She has global aphasia, does not follow commands. They confirmed CT head showed left MCA infarct, CTA head/neck showed left ICA occlusion, left MCA, ACA occlusion, MRI showed large left MCA infarct. Repeat CT was stable with no bleeding.   Cardiology was consulted in the setting of possible atrial fibrillation. There is no clear known history of A. Fib in the patients history. There are some EKGs  that are noted to have A. Fib but they were not confirmed by cardiology. There are some EKGs in the patients chart that noted A. Fib (11/09/2023).   After examining the patient and speaking with her husband, as the patient remains aphasic, he agrees with the history as stated above. He agrees that she has never been told that she has A. Fib before and was not on any long-term anticoagulation. EKG and telemetry are not overwhelmingly convincing for A. Fib, appear to be more sinus with frequent PACs and low voltage P waves.   Past Medical History:  Diagnosis Date   Acute hypoxic respiratory failure (HCC) 11/09/2023   Acute non-recurrent maxillary sinusitis 10/28/2022   Allergy    hay fever   Anemia    Anxiety    Back pain    Carotid artery occlusion    Cerebrovascular disease    extracranial; occlusive   Chicken pox    Coronary artery disease    Depression    Dizziness    Dizziness    DVT (deep venous thrombosis) (HCC)    Fainting spell    GERD (gastroesophageal reflux disease)    Headache    Heart murmur    Hyperlipidemia    Hypertension    Leg pain    Mitral regurgitation    PONV (postoperative nausea and vomiting)    severe nausea and vomiting   Pre-syncope    PVD (peripheral vascular disease) (HCC)    endarterectomy by Dr. Shirley Douglas   Seasonal allergies    Shortness of breath dyspnea    wth ambulation  at times   Swelling of both ankles    and abdomen; takes Lasix  when needed   Thrombophlebitis    following childbirth   Ulcer    Past Surgical History:  Procedure Laterality Date   ANTERIOR CERVICAL DECOMP/DISCECTOMY FUSION N/A 01/11/2023   Procedure: Anterior Cervical Decompression/Discectomy Fusion Cervical Five-Cervical Six - Cervical Six-Cervical Seven;  Surgeon: Agustina Aldrich, MD;  Location: [facility] OR;  Service: Neurosurgery;  Laterality: N/A;  3C   APPENDECTOMY     BREAST SURGERY     saline implants   CARDIAC CATHETERIZATION     CAROTID ENDARTERECTOMY  1992   CAROTID  ENDARTERECTOMY Right 02/13/2005   Re-do Right CE   CATARACT EXTRACTION, BILATERAL  2013   CHOLECYSTECTOMY, LAPAROSCOPIC  04/02/2014   Dr. Hortensia Ma   CORONARY ARTERY BYPASS GRAFT  2006   x3 Dr. Matt Song   ENDARTERECTOMY Left 07/20/2014   Procedure: ENDARTERECTOMY CAROTID-LEFT;  Surgeon: Mayo Speck, MD;  Location: [facility] OR;  Service: Vascular;  Laterality: Left;   EYE SURGERY Bilateral 10/2011   Cataract Left eye   SEPTOPLASTY     with bilateral inferior turbinate reductions   SPINE SURGERY  12/2015   TOTAL ABDOMINAL HYSTERECTOMY     UPPER GASTROINTESTINAL ENDOSCOPY     had polyps removed from esophagus   UPPER GI ENDOSCOPY      Home Medications:  Prior to Admission medications   Medication Sig Start Date End Date Taking? Authorizing Provider  albuterol  (VENTOLIN  HFA) 108 (90 Base) MCG/ACT inhaler Inhale 2 puffs into the lungs every 6 (six) hours as needed for wheezing or shortness of breath. 10/26/22  Yes Donnie Galea, MD  amLODipine  (NORVASC ) 2.5 MG tablet Take 1 tablet (2.5 mg total) by mouth in the morning. 12/20/23  Yes Gollan, Timothy J, MD  aspirin  81 MG tablet Take 1 tablet (81 mg total) by mouth daily. 03/29/18  Yes Gollan, Timothy J, MD  BREO ELLIPTA  100-25 MCG/ACT AEPB Inhale 1 puff into the lungs daily.   Yes [provider]  Coenzyme Q10 (CO Q10) 100 MG CAPS Take 100 mg by mouth daily.   Yes [provider]  empagliflozin  (JARDIANCE ) 10 MG TABS tablet Take 1 tablet (10 mg total) by mouth daily before breakfast. 12/10/23  Yes Shawnee Dellen A, FNP  ezetimibe  (ZETIA ) 10 MG tablet Take 1 tablet (10 mg total) by mouth daily. 12/20/23  Yes Gollan, Timothy J, MD  furosemide  (LASIX ) 40 MG tablet Take 1 tablet (40 mg total) by mouth daily. 12/03/23  Yes Donnie Galea, MD  gabapentin  (NEURONTIN ) 400 MG capsule Take 1 capsule (400 mg total) by mouth 3 (three) times daily. 11/12/23  Yes Shah, Vipul, MD  ipratropium (ATROVENT) 0.03 % nasal spray Place 2 sprays into  both nostrils 2 (two) times daily as needed for rhinitis. 05/26/23  Yes [provider]  loratadine  (CLARITIN ) 10 MG tablet Take 10 mg by mouth daily.   Yes [provider]  metoprolol  succinate (TOPROL -XL) 25 MG 24 hr tablet TAKE 1/2 TABLET AT BEDTIME 01/10/24  Yes Gollan, Timothy J, MD  potassium chloride  SA (KLOR-CON  M) 20 MEQ tablet Take 2 tablets (40 mEq total) by mouth daily. 12/03/23  Yes Donnie Galea, MD  rosuvastatin  (CRESTOR ) 20 MG tablet Take 1 tablet (20 mg total) by mouth daily. 12/20/23  Yes Gollan, Timothy J, MD  vitamin B-12 (CYANOCOBALAMIN ) 1000 MCG tablet Take 1 tablet (1,000 mcg total) by mouth daily. 04/24/18  Yes Donnie Galea, MD  omeprazole  (PRILOSEC) 20 MG capsule Take 1 capsule (20 mg total) by mouth 2 (two) times daily before a meal. Patient not taking: Reported on 02/07/2024 02/05/22   Donnie Galea, MD  polyethylene glycol powder (GLYCOLAX /MIRALAX ) 17 GM/SCOOP powder Take 17 g by mouth 2 (two) times daily as needed. 10/26/22   Donnie Galea, MD  sertraline  (ZOLOFT ) 100 MG tablet TAKE ONE AND A HALF TABLETS BY MOUTH EVERY DAY Patient taking differently: Take 150 mg by mouth daily. 12/02/23   Donnie Galea, MD   Scheduled Meds:   stroke: early stages of recovery book   Does not apply Once   aspirin   300 mg Rectal Daily   Or   aspirin  EC  81 mg Oral Daily   ezetimibe   10 mg Oral Daily   fluticasone  furoate-vilanterol  1 puff Inhalation Daily   gabapentin   400 mg Oral TID   heparin  injection (subcutaneous)  5,000 Units Subcutaneous Q8H   [START ON 02/09/2024] rosuvastatin   40 mg Oral Daily   sertraline   150 mg Oral Daily   Continuous Infusions:  sodium chloride  75 mL/hr at 02/08/24 1256   PRN Meds: acetaminophen  **OR** acetaminophen  (TYLENOL ) oral liquid 160 mg/5 mL **OR** acetaminophen , albuterol , senna-docusate  Allergies:    Allergies  Allergen Reactions   Lyrica  [Pregabalin ] Shortness Of Breath, Swelling and Other (See Comments)     Chest tightness, also    Hydrocodone  Other (See Comments)    Hallucinations    Ivp Dye [Iodinated Contrast Media] Hives and Itching        Lipitor [Atorvastatin] Nausea And Vomiting   Methylprednisolone  Other (See Comments)    Severe weekness    Oxycodone  Hcl Other (See Comments)    Hallucinations    Penicillin G Nausea And Vomiting   Shellfish Allergy Hives and Itching   Buspar  [Buspirone ] Other (See Comments)    Worsening mood   Clavulanic Acid Other (See Comments)    Reaction not noted   Prednisone  Other (See Comments)    Reaction not noted   Codeine Nausea And Vomiting and Other (See Comments)   Cymbalta  [Duloxetine  Hcl] Nausea Only    GI upset at 30mg    Demerol Nausea And Vomiting   Doxycycline  Nausea Only and Other (See Comments)    Weakness, sick to her stomach   Effexor  [Venlafaxine ] Nausea Only   Hydrochlorothiazide  Other (See Comments)    Not an allergy but urinary frequency and sweating at 25mg    Hydroxyzine  Other (See Comments)    Excessive sweating.    Metoprolol  Other (See Comments)    "Slowed body down," per pt    Protonix  [Pantoprazole  Sodium] Nausea Only   Trazodone  And Nefazodone Other (See Comments)    Sedation    Social History:   Social History   Socioeconomic History   Marital status: Married    Spouse name: Royston Cornea    Number of children: 1   Years of education: Not on file   Highest education level: 11th grade  Occupational History    Comment: Retired    Occupation: Retired  Tobacco Use   Smoking status: Former    Current packs/day: 0.00    Average packs/day: 0.3 packs/day for 15.0 years (4.5 ttl pk-yrs)    Types: Cigarettes    Start date: 08/31/1981    Quit date: 08/31/1996    Years since quitting: 27.4    Passive exposure: Never   Smokeless tobacco: Never  Vaping Use   Vaping status: Never Used  Substance and  Sexual Activity   Alcohol use: No    Alcohol/week: 0.0 standard drinks of alcohol   Drug use: No   Sexual activity: Not  Currently  Other Topics Concern   Not on file  Social History Narrative   Lives with husband in a one level    Work - retired Producer, television/film/video   Diet - healthy   Right handed    Caffeine- 1 cup per day.      Had 1 daughter. Daughter is deceased   Social Drivers of Corporate investment banker Strain: Low Risk  (11/12/2023)   Overall Financial Resource Strain (CARDIA)    Difficulty of Paying Living Expenses: Not hard at all  Food Insecurity: No Food Insecurity (11/15/2023)   Hunger Vital Sign    Worried About Running Out of Food in the Last Year: Never true    Ran Out of Food in the Last Year: Never true  Transportation Needs: No Transportation Needs (11/15/2023)   PRAPARE - Administrator, Civil Service (Medical): No    Lack of Transportation (Non-Medical): No  Physical Activity: Sufficiently Active (05/25/2023)   Exercise Vital Sign    Days of Exercise per Week: 5 days    Minutes of Exercise per Session: 30 min  Stress: No Stress Concern Present (05/25/2023)   [employer] of Occupational Health - Occupational Stress Questionnaire    Feeling of Stress : Not at all  Social Connections: Moderately Integrated (11/11/2023)   Social Connection and Isolation Panel [NHANES]    Frequency of Communication with Friends and Family: More than three times a week    Frequency of Social Gatherings with Friends and Family: More than three times a week    Attends Religious Services: 1 to 4 times per year    Active Member of [employer] or Organizations: No    Attends Banker Meetings: Never    Marital Status: Married  Catering manager Violence: Not At Risk (11/15/2023)   Humiliation, Afraid, Rape, and Kick questionnaire    Fear of Current or Ex-Partner: No    Emotionally Abused: No    Physically Abused: No    Sexually Abused: No    Family History:   Family History  Problem Relation Age of Onset   Aneurysm Mother        brain   Heart disease Mother        Aneyursm     Hyperlipidemia Mother    Hypertension Mother    Varicose Veins Mother    Bleeding Disorder Mother    Heart disease Father    Cirrhosis Father    Heart attack Father    Hyperlipidemia Father    Hypertension Father    Cancer Sister        lung   Heart disease Sister        Aneurysm   Hyperlipidemia Sister    Hypertension Sister    Varicose Veins Sister    Bleeding Disorder Sister    Heart disease Brother        Before age 51   Aneurysm Brother    Deep vein thrombosis Brother    Birth defects Brother    Hyperlipidemia Brother    Hypertension Brother    Peripheral vascular disease Daughter    Hyperlipidemia Daughter    Hypertension Daughter    Hypertension Other    Breast cancer Neg Hx    Colon cancer Neg Hx     ROS:  Please see the history  of present illness.  All other ROS reviewed and negative.     Physical Exam/Data: Vitals:   02/08/24 0439 02/08/24 0725 02/08/24 0755 02/08/24 1202  BP: (!) 155/75 (!) 156/57 (!) 150/55 (!) 151/54  Pulse: 80 79 78 69  Resp: 18 (!) 24 18 15   Temp: (!) 97.5 F (36.4 C) 98 F (36.7 C) 98.3 F (36.8 C) 98.5 F (36.9 C)  TempSrc: Oral Oral Oral Axillary  SpO2: 97% 98% 95% 96%  Weight:      Height:       Intake/Output Summary (Last 24 hours) at 02/08/2024 1422 Last data filed at 02/07/2024 2346 Gross per 24 hour  Intake 0 ml  Output --  Net 0 ml      02/07/2024    3:34 PM 02/07/2024    2:00 PM 01/28/2024    1:41 PM  Last 3 Weights  Weight (lbs) 137 lb 9.1 oz 137 lb 9.1 oz 134 lb  Weight (kg) 62.4 kg 62.4 kg 60.782 kg     Body mass index is 25.16 kg/m.   General:  resting in bed, global expressive aphasia  HEENT: normal Neck: no JVD Vascular: Distal pulses 2+ bilaterally Cardiac:  normal S1, S2; RRR; no murmur  Lungs:  clear to auscultation bilaterally, no wheezing, rhonchi or rales  Abd: soft, nontender, no hepatomegaly  Ext: no edema Musculoskeletal:  No deformities Skin: warm and dry  Neuro:  global expressive  aphasia   EKG:  The EKG was personally reviewed and demonstrates:  appears to be sinus with PACs, low voltage P waves make it hard to discern but overall appears very regular  Telemetry:  Telemetry was personally reviewed and demonstrates:  overall appearing to be sinus rhythm with PACs/PVCs, HR 70s, will discuss with MD  Relevant CV Studies: Echocardiogram, 02/08/2024 Ordered, pending updated results   Echocardiogram, 11/10/2023 Left ventricular ejection fraction, by estimation, is 60 to 65% . The left ventricle has normal function. The left ventricle has no regional wall motion abnormalities. Left ventricular diastolic parameters are indeterminate.  Right ventricular systolic function is normal. The right ventricular size is normal. There is normal pulmonary artery systolic pressure. The estimated right ventricular systolic pressure is 20. 8 mmHg.  The mitral valve is normal in structure. Mild mitral valve regurgitation. No evidence of mitral stenosis.  The aortic valve is normal in structure. Aortic valve regurgitation is not visualized. Aortic valve sclerosis is present, with no evidence of aortic valve stenosis.  The inferior vena cava is normal in size with greater than 50% respiratory variability, suggesting right atrial pressure of 3 mmHg.  Event monitor, 9 day 20 hr, 02/03/2021 Predominant underlying rhythm was Sinus Rhythm.  Patient had a min HR of 48 bpm, max HR of 135 bpm, and avg HR of 62 bpm.   3 Supraventricular Tachycardia/atrial tachycardia runs occurred, the run with the fastest interval lasting 9 beats with a max rate of 135 bpm, the  longest lasting 14.0 secs with an avg rate of 102 bpm.     Isolated SVEs were rare (<1.0%), SVE Couplets were rare (<1.0%), and SVE Triplets were rare (<1.0%). Isolated VEs were frequent (13.4%, 115534), VE Couplets were rare (<1.0%, 84), and VE Triplets were rare (<1.0%, 1).  Ventricular Bigeminy and Trigeminy were present.  No patient  triggered events noted  Nuclear stress test, 10/01/2021    The study is low risk.   No ST deviation was noted.   Left ventricular function is normal. Calculated LVEF is  79%.   There is no evidence for ischemia.   LAD and RCA calcifications noted.   Ascending and descending aorta calcifications noted.  Laboratory Data: High Sensitivity Troponin:  No results for input(s): "TROPONINIHS" in the last 720 hours.   Chemistry Recent Labs  Lab 02/07/24 1419 02/07/24 1437 02/07/24 1451 02/08/24 0525 02/08/24 0712  NA 137 139 138 141  --   K 4.2 4.2 3.6 3.9  --   CL 104 102 102 102  --   CO2 24  --   --  25  --   GLUCOSE 142* 143* 225* 154*  --   BUN 15 17 16 16   --   CREATININE 0.93 1.00 0.90 1.10*  --   CALCIUM  8.6*  --   --  9.3  --   MG  --   --   --   --  2.5*  GFRNONAA >60  --   --  50*  --   ANIONGAP 9  --   --  14  --     Recent Labs  Lab 02/07/24 1419 02/08/24 0525  PROT 6.7 7.1  ALBUMIN 4.0 4.0  AST 22 25  ALT 14 15  ALKPHOS 101 110  BILITOT 0.4 0.7   Lipids  Recent Labs  Lab 02/08/24 0525  CHOL 283*  TRIG 71  HDL 70  LDLCALC 199*  CHOLHDL 4.0    Hematology Recent Labs  Lab 02/07/24 1419 02/07/24 1437 02/07/24 1451 02/08/24 0525  WBC 11.4*  --   --  7.2  RBC 4.72  --   --  4.99  HGB 12.8 13.9 14.6 13.4  HCT 40.8 41.0 43.0 41.9  MCV 86.4  --   --  84.0  MCH 27.1  --   --  26.9  MCHC 31.4  --   --  32.0  RDW 13.7  --   --  14.1  PLT 281  --   --  305   Thyroid  No results for input(s): "TSH", "FREET4" in the last 168 hours.  BNPNo results for input(s): "BNP", "PROBNP" in the last 168 hours.  DDimer No results for input(s): "DDIMER" in the last 168 hours.  Radiology/Studies:  VAS US  LOWER EXTREMITY VENOUS (DVT) Result Date: 02/08/2024  Lower Venous DVT Study Patient Name:  Catherine Green  Date of Exam:   02/08/2024 Medical Rec #: [id number]       Accession #:    [id number] Date of Birth: 1940-10-31       Patient Gender: F Patient Age:   45 years Exam  Location:  [facility] Procedure:      VAS US  LOWER EXTREMITY VENOUS (DVT) Referring Phys: Fraser Jackson XU --------------------------------------------------------------------------------  Indications: Stroke.  Risk Factors: None identified. Limitations: Poor ultrasound/tissue interface and patient positioning, patient immobility. Comparison Study: No prior studies. Performing Technologist: Lerry Ransom RVT  Examination Guidelines: A complete evaluation includes B-mode imaging, spectral Doppler, color Doppler, and power Doppler as needed of all accessible portions of each vessel. Bilateral testing is considered an integral part of a complete examination. Limited examinations for reoccurring indications may be performed as noted. The reflux portion of the exam is performed with the patient in reverse Trendelenburg.  +---------+---------------+---------+-----------+----------+--------------+ RIGHT    CompressibilityPhasicitySpontaneityPropertiesThrombus Aging +---------+---------------+---------+-----------+----------+--------------+ CFV      Full           Yes      Yes                                 +---------+---------------+---------+-----------+----------+--------------+  SFJ      Full                                                        +---------+---------------+---------+-----------+----------+--------------+ FV Prox  Full                                                        +---------+---------------+---------+-----------+----------+--------------+ FV Mid   Full                                                        +---------+---------------+---------+-----------+----------+--------------+ FV Distal               Yes      Yes                                 +---------+---------------+---------+-----------+----------+--------------+ PFV      Full                                                         +---------+---------------+---------+-----------+----------+--------------+ POP      Full           Yes      Yes                                 +---------+---------------+---------+-----------+----------+--------------+ PTV      Full                                                        +---------+---------------+---------+-----------+----------+--------------+ PERO     Full                                                        +---------+---------------+---------+-----------+----------+--------------+   +---------+---------------+---------+-----------+----------+--------------+ LEFT     CompressibilityPhasicitySpontaneityPropertiesThrombus Aging +---------+---------------+---------+-----------+----------+--------------+ CFV      Full           Yes      Yes                                 +---------+---------------+---------+-----------+----------+--------------+ SFJ      Full                                                        +---------+---------------+---------+-----------+----------+--------------+  FV Prox  Full                                                        +---------+---------------+---------+-----------+----------+--------------+ FV Mid   Full                                                        +---------+---------------+---------+-----------+----------+--------------+ FV DistalFull                                                        +---------+---------------+---------+-----------+----------+--------------+ PFV      Full                                                        +---------+---------------+---------+-----------+----------+--------------+ POP      Full           Yes      Yes                                 +---------+---------------+---------+-----------+----------+--------------+ PTV      Full                                                         +---------+---------------+---------+-----------+----------+--------------+ PERO     Full                                                        +---------+---------------+---------+-----------+----------+--------------+    Summary: RIGHT: - There is no evidence of deep vein thrombosis in the lower extremity. However, portions of this examination were limited- see technologist comments above.  - No cystic structure found in the popliteal fossa.  LEFT: - There is no evidence of deep vein thrombosis in the lower extremity. However, portions of this examination were limited- see technologist comments above.  - No cystic structure found in the popliteal fossa.  *See table(s) above for measurements and observations.    Preliminary    CT HEAD POST STROKE FOLLOWUP/TIMED/STAT READ Result Date: 02/08/2024 CLINICAL DATA:  Follow-up examination for neuro deficit, stroke. EXAM: CT HEAD WITHOUT CONTRAST TECHNIQUE: Contiguous axial images were obtained from the base of the skull through the vertex without intravenous contrast. RADIATION DOSE REDUCTION: This exam was performed according to the departmental dose-optimization program which includes automated exposure control, adjustment of the mA and/or kV according to patient size and/or use of iterative reconstruction technique. COMPARISON:  Comparison made with MRI from 02/07/2024 FINDINGS: Brain:  Evolving cytotoxic edema involving the left frontal lobe, consistent with an evolving acute left MCA distribution infarct, stable from prior MRI. No evidence for hemorrhagic transformation. Localized edema with partial effacement of the left lateral ventricle, but no significant midline shift, relatively similar to prior. No other acute intracranial hemorrhage. No other acute large vessel territory infarct. No mass lesion or hydrocephalus. No extra-axial fluid collection. Vascular: Asymmetric hyperdense left MCA, consistent with previously identified occlusion. Skull: Scalp  soft tissues and calvarium demonstrate no new finding. Sinuses/Orbits: Globes orbital soft tissues demonstrate no acute finding. Paranasal sinuses and mastoid air cells remain largely clear. Other: None. IMPRESSION: 1. Evolving acute left MCA distribution infarct, not significantly changed from recent MRI. No evidence for hemorrhagic transformation. Localized edema with partial effacement of the left lateral ventricle, but no significant midline shift. 2. No other new acute intracranial abnormality. Electronically Signed   By: Virgia Griffins M.D.   On: 02/08/2024 01:32   MR BRAIN WO CONTRAST Result Date: 02/07/2024 CLINICAL DATA:  Follow-up examination for stroke. EXAM: MRI HEAD WITHOUT CONTRAST TECHNIQUE: Multiplanar, multiecho pulse sequences of the brain and surrounding structures were obtained without intravenous contrast. COMPARISON:  CTs from earlier the same day. FINDINGS: Brain: Cerebral volume within normal limits. Patchy T2/FLAIR hyperintensity involving the periventricular and deep white matter both cerebral hemispheres the consistent with chronic small vessel ischemic disease, mild to moderate in nature. Confluent restricted diffusion involving the left frontal lobe, consistent with a moderate size evolving left MCA territory infarct. Involvement of the left insula and left basal ganglia noted. No associated hemorrhage or significant regional mass effect. No other evidence for acute or subacute ischemia. No acute intracranial hemorrhage. Few punctate chronic micro hemorrhages noted involving the right cerebellum and right cerebral hemisphere. No mass lesion, midline shift or mass effect. No hydrocephalus or extra-axial fluid collection. Pituitary gland within normal limits. Vascular: Loss of normal flow void within the left ICA and MCA, consistent with occlusion as seen on prior CTA. Major intracranial vascular flow voids are otherwise maintained. Skull and upper cervical spine: Craniocervical  junction within normal limits. Bone marrow signal intensity normal. No scalp soft tissue abnormality. Sinuses/Orbits: Prior bilateral ocular lens replacement. Paranasal sinuses are largely clear. No mastoid effusion. Other: None. IMPRESSION: 1. Moderate sized acute left MCA territory infarct. No associated hemorrhage or significant regional mass effect. 2. Loss of normal flow void within the left ICA and MCA, consistent with occlusion as seen on prior CTA. 3. Underlying mild to moderate chronic microvascular ischemic disease. Electronically Signed   By: Virgia Griffins M.D.   On: 02/07/2024 20:30   CT ANGIO HEAD NECK W WO CM W PERF (CODE STROKE) Result Date: 02/07/2024 CLINICAL DATA:  Neuro deficit, acute, stroke suspected. EXAM: CT ANGIOGRAPHY HEAD AND NECK CT PERFUSION BRAIN TECHNIQUE: Multidetector CT imaging of the head and neck was performed using the standard protocol during bolus administration of intravenous contrast. Multiplanar CT image reconstructions and MIPs were obtained to evaluate the vascular anatomy. Carotid stenosis measurements (when applicable) are obtained utilizing NASCET criteria, using the distal internal carotid diameter as the denominator. Multiphase CT imaging of the brain was performed following IV bolus contrast injection. Subsequent parametric perfusion maps were calculated using RAPID software. RADIATION DOSE REDUCTION: This exam was performed according to the departmental dose-optimization program which includes automated exposure control, adjustment of the mA and/or kV according to patient size and/or use of iterative reconstruction technique. CONTRAST:  100mL OMNIPAQUE IOHEXOL 350 MG/ML SOLN COMPARISON:  CTA neck  04/22/2020 FINDINGS: CTA NECK FINDINGS Aortic arch: Standard branching with prominent calcified plaque involving the great vessel origins including a proximally 75% stenosis of the origin of the brachiocephalic artery, similar to the prior CTA. Right carotid system:  Status post endarterectomy with fusiform aneurysmal dilatation of the common carotid artery to a maximal diameter 1.3 cm, not significantly changed from the prior CTA. Similar appearance of calcified and ulcerated soft plaque in this region. Widely patent ICA. Left carotid system: Patent with 50% stenosis of the common carotid artery origin and less than 50% stenosis of the proximal ICA, similar to the prior CTA. Vertebral arteries: Patent without evidence of a significant stenosis or dissection within limitation of streak artifact which obscures a portion of the right V1 segment. Element left vertebral artery. Skeleton: C5-C7 ACDF. Advanced upper cervical facet arthrosis. C3-4 facet ankylosis. Other neck: No evidence of cervical lymphadenopathy or mass. Upper chest: Partially visualized small to moderate bilateral pleural effusions with associated atelectasis. Status post CABG. Review of the MIP images confirms the above findings CTA HEAD FINDINGS Anterior circulation: The intracranial left ICA is patent proximally, however there is progressively diminished opacification more distally with the vessel appearing occluded beginning in the cavernous segment through the terminus. The left M1 and proximal left A1 segments are also occluded. There is reconstitution of the left ACA via the anterior communicating artery. There is mild-to-moderate reconstitution of the distal left M1 segment and left MCA branch vessels. The intracranial right ICA is patent with mild nonstenotic atherosclerosis. The right ACA and right MCA are patent without evidence of a significant proximal stenosis. No aneurysm is identified. Posterior circulation: The intracranial vertebral arteries are patent to the basilar with the right being hypoplastic distal to the PICA origin. Patent PICA and SCA origins are visualized bilaterally. The basilar artery is patent and mildly irregular without a significant stenosis. Posterior communicating arteries are  diminutive or absent. Both PCAs are patent without evidence of a significant proximal stenosis. No aneurysm is identified. Venous sinuses: As permitted by contrast timing, patent. Anatomic variants: None. Review of the MIP images confirms the above findings CT Brain Perfusion Findings: CT perfusion imaging is felt to be of limited diagnostic utility due to bolus timing, with the arterial upstroke being incompletely captured and without a good venous outflow curve. The perfusion software does not detect the large left MCA infarct which is apparent on the earlier noncontrast head CT and reports 22 mL of mismatch volume. These results were communicated to Dr. Lindzen at 3:21 pm on 02/07/2024 by text page via the [facility] messaging system. IMPRESSION: 1. 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 2. Limited CT perfusion imaging as described above. 3. 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 4. Unchanged 50% stenosis of the origin of the left common carotid artery and 75% stenosis of the brachiocephalic artery. 5. Partially visualized small to moderate bilateral pleural effusions. 6.  Aortic Atherosclerosis (ICD10-I70.0). Electronically Signed   By: Aundra Lee M.D.   On: 02/07/2024 15:40   CT HEAD CODE STROKE WO CONTRAST Result Date: 02/07/2024 CLINICAL DATA:  Code stroke. Neuro deficit, acute, stroke suspected. Fall this morning. Altered mental status. Aphasia, right-sided deficits, and left-sided facial droop. EXAM: CT HEAD WITHOUT CONTRAST TECHNIQUE: Contiguous axial images were obtained from the base of the skull through the vertex without intravenous contrast. RADIATION DOSE REDUCTION: This exam was performed according to the departmental dose-optimization program which includes  automated exposure control, adjustment of the mA and/or kV according to patient size and/or use of iterative reconstruction technique.  COMPARISON:  Head MRI 08/03/2018 FINDINGS: Brain: There is evidence of an acute left MCA territory infarct involving portions of the frontal and parietal lobes (most notably at the level of the operculum), insula, caudate, lentiform nucleus, and likely internal capsule. No acute intracranial hemorrhage, significant midline shift, hydrocephalus, or extra-axial fluid collection is identified. Cerebral white matter hypodensities elsewhere are nonspecific but compatible with mild chronic small vessel ischemic disease. Cerebral volume is within normal limits for age. Vascular: Calcified atherosclerosis at the skull base. Hyperdense left M1 segment. Skull: No fracture or suspicious lesion. Sinuses/Orbits: Visualized paranasal sinuses and mastoid air cells are clear. Bilateral cataract extraction. Other: None. ASPECTS [facility] Stroke Program Early CT Score) - Ganglionic level infarction (caudate, lentiform nuclei, internal capsule, insula, M1-M3 cortex): 1 - Supraganglionic infarction (M4-M6 cortex): 2 Total score (0-10 with 10 being normal): 3 These results were communicated to Dr. Lindzen at 2:33 pm on 02/07/2024 by text page via the [facility] messaging system. IMPRESSION: Acute left MCA territory infarct. ASPECTS of 3. Electronically Signed   By: Aundra Lee M.D.   On: 02/07/2024 14:33   Assessment and Plan:  Frequent PACs/PVCs vs paroxysmal atrial fibrillation vs wandering atrial pacemaker Patient presented as Code Stroke, found to have large left MCA infarct Patient doesn't have known history of PAF but it has been noted on prior EKGs Patient was not on long-term AC Home meds: Toprol  12.5 mg at bedtime EKG and telemetry is not overwhelmingly convincing to be A. Fib, P waves do appear to  have different morphology -- will discuss with MD Patient currently on ASA, will defer to neurology Order updated echocardiogram Continue to monitor on telemetry as an outpatient Would benefit from 30 day monitor at discharge    Per primary Large left MCA stroke  Carotid artery disease  CAD s/p CABG HTN HLD Dysphagia  Leukocytosis  Mood disorders COPD Chronic HFpEF  Risk Assessment/Risk Scores:     [place]  Heart Association (NYHA) Functional Class NYHA Class I  CHA2DS2-VASc Score = 8   This indicates a 10.8% annual risk of stroke. The patient's score is based upon: CHF History: 1 HTN History: 1 Diabetes History: 0 Stroke History: 2 Vascular Disease History: 1 Age Score: 2 Gender Score: 1       For questions or updates, please contact [facility] Please consult [email or web address] for contact info under    Signed, Jiles Mote, PA-C  02/08/2024 2:22 PM  Attending Note:   The patient was seen and examined.  Agree with assessment and plan as noted above.  Changes made to the above note as needed.  Patient seen and independently examined with Catherine Plant, PA .   We discussed all aspects of the encounter. I agree with the assessment and plan as stated above.     CVA :   Catherine Green has had HR irregularities but has not had documented atrial fib that we can identify  She has very small P waves and has frequent PACs.  She has significant carotid artery disease that has been followed by [facility] for years   For now I would continue with neuro's recommendations ( ASA every day )  We will place a 30 day monitor at the time of discharge and continue to look for any evidence of atrial fib   Will repeat her echo    Has hx of CAD /  CABG.   No recent episodes of CP according to family     I have spent a total of 40 minutes with patient reviewing hospital  notes , telemetry, EKGs, labs and examining patient as well as establishing an assessment and plan that was discussed with the patient.  > 50% of time was spent in direct patient care.    [place], Marieta Shorten., MD, [facility] 02/08/2024, 2:58 PM [address],  [address] Office [phone] Pager [phone]

## 2024-02-08 NOTE — TOC CAGE-AID Note (Signed)
 Transition of Care [facility]) - CAGE-AID Screening   Patient Details  Name: Catherine Green MRN: [id number] Date of Birth: [birth year]  [facility]) CM/SW Contact:    Shelbi Vaccaro E Phynix Horton, LCSW Phone Number: 02/08/2024, 1:32 PM   Clinical Narrative: Alcohol/drug screens negative.   CAGE-AID Screening: Substance Abuse Screening unable to be completed due to: : Patient unable to participate

## 2024-02-08 NOTE — Evaluation (Signed)
 Clinical/Bedside Swallow Evaluation Patient Details  Name: Catherine Green MRN: [id number] Date of Birth: 1941-08-24  Today's Date: 02/08/2024 Time: SLP Start Time (ACUTE ONLY): 1012 SLP Stop Time (ACUTE ONLY): 1045 SLP Time Calculation (min) (ACUTE ONLY): 33 min  Past Medical History:  Past Medical History:  Diagnosis Date   Acute hypoxic respiratory failure (HCC) 11/09/2023   Acute non-recurrent maxillary sinusitis 10/28/2022   Allergy    hay fever   Anemia    Anxiety    Back pain    Carotid artery occlusion    Cerebrovascular disease    extracranial; occlusive   Chicken pox    Coronary artery disease    Depression    Dizziness    Dizziness    DVT (deep venous thrombosis) (HCC)    Fainting spell    GERD (gastroesophageal reflux disease)    Headache    Heart murmur    Hyperlipidemia    Hypertension    Leg pain    Mitral regurgitation    PONV (postoperative nausea and vomiting)    severe nausea and vomiting   Pre-syncope    PVD (peripheral vascular disease) (HCC)    endarterectomy by Dr. Shirley Douglas   Seasonal allergies    Shortness of breath dyspnea    wth ambulation at times   Swelling of both ankles    and abdomen; takes Lasix  when needed   Thrombophlebitis    following childbirth   Ulcer    Past Surgical History:  Past Surgical History:  Procedure Laterality Date   ANTERIOR CERVICAL DECOMP/DISCECTOMY FUSION N/A 01/11/2023   Procedure: Anterior Cervical Decompression/Discectomy Fusion Cervical Five-Cervical Six - Cervical Six-Cervical Seven;  Surgeon: Agustina Aldrich, MD;  Location: [facility] OR;  Service: Neurosurgery;  Laterality: N/A;  3C   APPENDECTOMY     BREAST SURGERY     saline implants   CARDIAC CATHETERIZATION     CAROTID ENDARTERECTOMY  1992   CAROTID ENDARTERECTOMY Right 02/13/2005   Re-do Right CE   CATARACT EXTRACTION, BILATERAL  2013   CHOLECYSTECTOMY, LAPAROSCOPIC  04/02/2014   Dr. Hortensia Ma   CORONARY ARTERY BYPASS GRAFT  2006   x3 Dr. Matt Song    ENDARTERECTOMY Left 07/20/2014   Procedure: ENDARTERECTOMY CAROTID-LEFT;  Surgeon: Mayo Speck, MD;  Location: [facility] OR;  Service: Vascular;  Laterality: Left;   EYE SURGERY Bilateral 10/2011   Cataract Left eye   SEPTOPLASTY     with bilateral inferior turbinate reductions   SPINE SURGERY  12/2015   TOTAL ABDOMINAL HYSTERECTOMY     UPPER GASTROINTESTINAL ENDOSCOPY     had polyps removed from esophagus   UPPER GI ENDOSCOPY     HPI:  Patient is an 83 year old female admitted to [facility] with acute onset right-sided weakness.  Patient found to have left MCA CVA evolving.  Swallow and speech eval ordered.  Patient resides with her spouse and was using a walker prior to admission due to back issues per staff..    Assessment / Plan / Recommendation  Clinical Impression  Patient demonstrating clinical indication concerning for dysphagia with potential pharyngeal component.  She demonstrates right facial asymmetry with inability to produce labial seal.  In addition lingual protrusion was very impaired but uncertain if this was due to aphasia and/or motor impairments.  SLP set up oral suction and provided oral care with toothbrush and toothpaste.  Patient barely phonated twice during session with very weak output.  She was fully alert during the session on helping hand  overhand with intake using her left hand.  P.o. trials included single ice chip, teaspoons of water, teaspoons, cup bolus and straw bolus of nectar thick liquid, single bite of puree and single bolus of cracker placed on her left oral cavity.  Patient demonstrates concern for oral awareness deficit/control to boluses provided and potential premature spillage of ice and nectar thick liquid via straw into airway.  In addition when she appears with lingual protrusion and negatively impairing labial seal with liquids. Immediate and delayed moderately significant coughing noted with patient chips and straw boluses of nectar thick  liquid-suspect due to spillage into the airway before swallow trigger.  Concerns are present for CN deficits that are impacting patient's swallowing and her language deficits impair her ability to follow directions thus increasing her aspiration pneumonia risk if she does aspirate.  At this time patient will benefit from [facility].  She was noted to become sleepy during session and therefore would benefit from doing medications today with applesauce and providing teaspoons of thin water.  Family present during session and educated thoroughly to recommendations including clinical reasoning.  Anticipate patient will be appropriate for diet tomorrow morning after her instrumental evaluation.  Messaged RN and posted swallow precautions signs.  Spouse denies patient having this issue prior to admission therefore this is likely CVA related. SLP Visit Diagnosis: Dysphagia, unspecified (R13.10)    Aspiration Risk  Moderate aspiration risk;Severe aspiration risk;Risk for inadequate nutrition/hydration    Diet Recommendation Other (Comment) (Teaspoon amounts of water and medicine - crushed with puree)    Liquid Administration via: Spoon Medication Administration: Crushed with puree Supervision: Staff to assist with self feeding Compensations: Slow rate;Small sips/bites Postural Changes: Seated upright at 90 degrees;Remain upright for at least 30 minutes after po intake    Other  Recommendations Oral Care Recommendations: Oral care prior to ice chip/H20     Assistance Recommended at Discharge    Functional Status Assessment Patient has had a recent decline in their functional status and demonstrates the ability to make significant improvements in function in a reasonable and predictable amount of time.  Frequency and Duration min 1 x/week  1 week       Prognosis Prognosis for improved oropharyngeal function: Fair Barriers to Reach Goals: Language deficits;Other (Comment)      Swallow Study   General  Date of Onset: 02/07/24 HPI: Patient is an 83 year old female admitted to [facility] with acute onset right-sided weakness.  Patient found to have left MCA CVA evolving.  Swallow and speech eval ordered.  Patient resides with her spouse and was using a walker prior to admission due to back issues per staff.. Type of Study: Bedside Swallow Evaluation Diet Prior to this Study: NPO Temperature Spikes Noted: No Respiratory Status: Nasal cannula Behavior/Cognition: Alert;Other (Comment);Doesn't follow directions Oral Cavity Assessment: Dry Oral Care Completed by SLP: Yes (SLP set up oral suction and provided dental brushing and oral care prior to p.o. trials.) Oral Cavity - Dentition: Adequate natural dentition Vision:  (Hand overhand assist unsure about vision) Patient Positioning: Upright in bed Baseline Vocal Quality: Other (comment);Suspected CN X (Vagus) involvement (Patient phonated twice during the session with severe dysphonia) Volitional Cough:  (Volitional cough attempted but patient unable to produce.  Reflexive cough likely correlated to aspiration appeared strong)    Oral/Motor/Sensory Function Overall Oral Motor/Sensory Function: Moderate impairment Facial ROM: Reduced right Facial Symmetry: Abnormal symmetry right Facial Strength: Reduced right (Did not form seal on suction) Facial Sensation:  (Unable to  assess -but patient did wipe toothpaste from her right lip therefore some sensation present) Lingual ROM: Other (Comment) (Difficult to assess due to decreased ability to follow directions.  Upon attempt at protrusion very limited) Lingual Symmetry: Within Functional Limits Lingual Strength: Other (Comment) (Difficult to assess due to patient inability to follow directions but with attempt to protrude very limited) Velum: Other (comment) (Could not view) Mandible: Other (Comment) (Did not test due to patient's decreased understanding of information)   Ice Chips Ice chips:  Impaired Presentation: Spoon Oral Phase Impairments: Reduced labial seal;Reduced lingual movement/coordination Oral Phase Functional Implications: Other (comment);Prolonged oral transit;Oral holding (Clinically appeared with prolonged oral transit being) Pharyngeal Phase Impairments: Cough - Delayed   Thin Liquid Thin Liquid: Impaired Presentation: Spoon Oral Phase Impairments: Reduced labial seal;Reduced lingual movement/coordination Oral Phase Functional Implications: Prolonged oral transit (Clinically appeared with prolonged oral transiting) Pharyngeal  Phase Impairments: Suspected delayed Swallow    Nectar Thick Nectar Thick Liquid: Impaired Presentation: Self Fed;Spoon;Straw Oral Phase Impairments: Reduced labial seal;Reduced lingual movement/coordination Oral phase functional implications: Prolonged oral transit Pharyngeal Phase Impairments: Cough - Immediate Other Comments: Clinically appeared with prolonged oral transiting and suspected aspiration of nectar via straw due to premature spillage into open airway   Honey Thick Honey Thick Liquid: Not tested   Puree Puree: Impaired Presentation: Spoon Oral Phase Functional Implications: Prolonged oral transit;Other (comment) Pharyngeal Phase Impairments: Suspected delayed Swallow   Solid     Solid: Impaired Oral Phase Impairments: Reduced lingual movement/coordination Oral Phase Functional Implications: Prolonged oral transit;Impaired mastication;Oral holding Pharyngeal Phase Impairments: Suspected delayed Swallow      Chantal Comment 02/08/2024,11:31 AM  Maudie Sorrow, MS [facility] SLP Acute Rehab Services Office [phone]

## 2024-02-08 NOTE — ED Notes (Signed)
 Patient transported to CT

## 2024-02-08 NOTE — Progress Notes (Signed)
 STROKE TEAM PROGRESS NOTE   SUBJECTIVE (INTERVAL HISTORY) Her husband and son are at the bedside.  Overall her condition is stable, but still has global aphasia and right sided weakness. Now on ASA. Will contact cardiology to clarify if she has hx of afib.    OBJECTIVE Temp:  [96.8 F (36 C)-98.5 F (36.9 C)] 98.5 F (36.9 C) (06/10 1202) Pulse Rate:  [63-84] 69 (06/10 1202) Cardiac Rhythm: Normal sinus rhythm;Heart block (06/10 0700) Resp:  [11-24] 15 (06/10 1202) BP: (130-173)/(54-93) 151/54 (06/10 1202) SpO2:  [93 %-98 %] 96 % (06/10 1202) Weight:  [62.4 kg] 62.4 kg (06/09 1534)  Recent Labs  Lab 02/07/24 1431  GLUCAP 132*   Recent Labs  Lab 02/07/24 1419 02/07/24 1437 02/07/24 1451 02/08/24 0525 02/08/24 0712  NA 137 139 138 141  --   K 4.2 4.2 3.6 3.9  --   CL 104 102 102 102  --   CO2 24  --   --  25  --   GLUCOSE 142* 143* 225* 154*  --   BUN 15 17 16 16   --   CREATININE 0.93 1.00 0.90 1.10*  --   CALCIUM  8.6*  --   --  9.3  --   MG  --   --   --   --  2.5*   Recent Labs  Lab 02/07/24 1419 02/08/24 0525  AST 22 25  ALT 14 15  ALKPHOS 101 110  BILITOT 0.4 0.7  PROT 6.7 7.1  ALBUMIN 4.0 4.0   Recent Labs  Lab 02/07/24 1419 02/07/24 1437 02/07/24 1451 02/08/24 0525  WBC 11.4*  --   --  7.2  NEUTROABS 9.8*  --   --   --   HGB 12.8 13.9 14.6 13.4  HCT 40.8 41.0 43.0 41.9  MCV 86.4  --   --  84.0  PLT 281  --   --  305   No results for input(s): "CKTOTAL", "CKMB", "CKMBINDEX", "TROPONINI" in the last 168 hours. Recent Labs    02/07/24 1419  LABPROT 13.3  INR 1.0   No results for input(s): "COLORURINE", "LABSPEC", "PHURINE", "GLUCOSEU", "HGBUR", "BILIRUBINUR", "KETONESUR", "PROTEINUR", "UROBILINOGEN", "NITRITE", "LEUKOCYTESUR" in the last 72 hours.  Invalid input(s): "APPERANCEUR"     Component Value Date/Time   CHOL 283 (H) 02/08/2024 0525   CHOL 110 02/26/2021 1124   TRIG 71 02/08/2024 0525   HDL 70 02/08/2024 0525   HDL 66 02/26/2021  1124   CHOLHDL 4.0 02/08/2024 0525   VLDL 14 02/08/2024 0525   LDLCALC 199 (H) 02/08/2024 0525   LDLCALC 28 02/26/2021 1124   Lab Results  Component Value Date   HGBA1C 5.8 (A) 02/07/2021   No results found for: "LABOPIA", "COCAINSCRNUR", "LABBENZ", "AMPHETMU", "THCU", "LABBARB"  Recent Labs  Lab 02/07/24 1419  ETH <15    I have personally reviewed the radiological images below and agree with the radiology interpretations.  CT HEAD POST STROKE FOLLOWUP/TIMED/STAT READ Result Date: 02/08/2024 CLINICAL DATA:  Follow-up examination for neuro deficit, stroke. EXAM: CT HEAD WITHOUT CONTRAST TECHNIQUE: Contiguous axial images were obtained from the base of the skull through the vertex without intravenous contrast. RADIATION DOSE REDUCTION: This exam was performed according to the departmental dose-optimization program which includes automated exposure control, adjustment of the mA and/or kV according to patient size and/or use of iterative reconstruction technique. COMPARISON:  Comparison made with MRI from 02/07/2024 FINDINGS: Brain: Evolving cytotoxic edema involving the left frontal lobe, consistent with an evolving  acute left MCA distribution infarct, stable from prior MRI. No evidence for hemorrhagic transformation. Localized edema with partial effacement of the left lateral ventricle, but no significant midline shift, relatively similar to prior. No other acute intracranial hemorrhage. No other acute large vessel territory infarct. No mass lesion or hydrocephalus. No extra-axial fluid collection. Vascular: Asymmetric hyperdense left MCA, consistent with previously identified occlusion. Skull: Scalp soft tissues and calvarium demonstrate no new finding. Sinuses/Orbits: Globes orbital soft tissues demonstrate no acute finding. Paranasal sinuses and mastoid air cells remain largely clear. Other: None. IMPRESSION: 1. Evolving acute left MCA distribution infarct, not significantly changed from recent  MRI. No evidence for hemorrhagic transformation. Localized edema with partial effacement of the left lateral ventricle, but no significant midline shift. 2. No other new acute intracranial abnormality. Electronically Signed   By: Virgia Griffins M.D.   On: 02/08/2024 01:32   MR BRAIN WO CONTRAST Result Date: 02/07/2024 CLINICAL DATA:  Follow-up examination for stroke. EXAM: MRI HEAD WITHOUT CONTRAST TECHNIQUE: Multiplanar, multiecho pulse sequences of the brain and surrounding structures were obtained without intravenous contrast. COMPARISON:  CTs from earlier the same day. FINDINGS: Brain: Cerebral volume within normal limits. Patchy T2/FLAIR hyperintensity involving the periventricular and deep white matter both cerebral hemispheres the consistent with chronic small vessel ischemic disease, mild to moderate in nature. Confluent restricted diffusion involving the left frontal lobe, consistent with a moderate size evolving left MCA territory infarct. Involvement of the left insula and left basal ganglia noted. No associated hemorrhage or significant regional mass effect. No other evidence for acute or subacute ischemia. No acute intracranial hemorrhage. Few punctate chronic micro hemorrhages noted involving the right cerebellum and right cerebral hemisphere. No mass lesion, midline shift or mass effect. No hydrocephalus or extra-axial fluid collection. Pituitary gland within normal limits. Vascular: Loss of normal flow void within the left ICA and MCA, consistent with occlusion as seen on prior CTA. Major intracranial vascular flow voids are otherwise maintained. Skull and upper cervical spine: Craniocervical junction within normal limits. Bone marrow signal intensity normal. No scalp soft tissue abnormality. Sinuses/Orbits: Prior bilateral ocular lens replacement. Paranasal sinuses are largely clear. No mastoid effusion. Other: None. IMPRESSION: 1. Moderate sized acute left MCA territory infarct. No  associated hemorrhage or significant regional mass effect. 2. Loss of normal flow void within the left ICA and MCA, consistent with occlusion as seen on prior CTA. 3. Underlying mild to moderate chronic microvascular ischemic disease. Electronically Signed   By: Virgia Griffins M.D.   On: 02/07/2024 20:30   CT ANGIO HEAD NECK W WO CM W PERF (CODE STROKE) Result Date: 02/07/2024 CLINICAL DATA:  Neuro deficit, acute, stroke suspected. EXAM: CT ANGIOGRAPHY HEAD AND NECK CT PERFUSION BRAIN TECHNIQUE: Multidetector CT imaging of the head and neck was performed using the standard protocol during bolus administration of intravenous contrast. Multiplanar CT image reconstructions and MIPs were obtained to evaluate the vascular anatomy. Carotid stenosis measurements (when applicable) are obtained utilizing NASCET criteria, using the distal internal carotid diameter as the denominator. Multiphase CT imaging of the brain was performed following IV bolus contrast injection. Subsequent parametric perfusion maps were calculated using RAPID software. RADIATION DOSE REDUCTION: This exam was performed according to the departmental dose-optimization program which includes automated exposure control, adjustment of the mA and/or kV according to patient size and/or use of iterative reconstruction technique. CONTRAST:  OMNIPAQUE IOHEXOL 350 MG/ML SOLN COMPARISON:  CTA neck 04/22/2020 FINDINGS: CTA NECK FINDINGS Aortic arch: Standard branching with prominent calcified  plaque involving the great vessel origins including a proximally 75% stenosis of the origin of the brachiocephalic artery, similar to the prior CTA. 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 Similar appearance of calcified and ulcerated soft plaque in this region. Widely patent ICA. Left carotid system: Patent with  50% stenosis of the common carotid artery origin and less than 50% stenosis of the proximal ICA, similar to the prior CTA. Vertebral arteries: Patent without evidence of a significant stenosis or dissection within limitation of streak artifact which obscures a portion of the right V1 segment. Element left vertebral artery. Skeleton: C5-C7 ACDF. Advanced upper cervical facet arthrosis. C3-4 facet ankylosis. Other neck: No evidence of cervical lymphadenopathy or mass. Upper chest: Partially visualized small to moderate bilateral pleural effusions with associated atelectasis. Status post CABG. Review of the MIP images confirms the above findings CTA HEAD FINDINGS Anterior circulation: The intracranial left ICA is patent proximally, however there is progressively diminished opacification more distally with the vessel appearing occluded beginning in the cavernous segment through the terminus. The left M1 and proximal left A1 segments are also occluded. There is reconstitution of the left ACA via the anterior communicating artery. There is mild-to-moderate reconstitution of the distal left M1 segment and left MCA branch vessels. The intracranial right ICA is patent with mild nonstenotic atherosclerosis. The right ACA and right MCA are patent without evidence of a significant proximal stenosis. No aneurysm is identified. Posterior circulation: The intracranial vertebral arteries are patent to the basilar with the right being hypoplastic distal to the PICA origin. Patent PICA and SCA origins are visualized bilaterally. The basilar artery is patent and mildly irregular without a significant stenosis. Posterior communicating arteries are diminutive or absent. Both PCAs are patent without evidence of a significant proximal stenosis. No aneurysm is identified. Venous sinuses: As permitted by contrast timing, patent. Anatomic variants: None. Review of the MIP images confirms the above findings CT Brain Perfusion Findings: CT  perfusion imaging is felt to be of limited diagnostic utility due to bolus timing, with the arterial upstroke being incompletely captured and without a good venous outflow curve. The perfusion software does not detect the large left MCA infarct which is apparent on the earlier noncontrast head CT and reports 22 mL of mismatch volume. These results were communicated to Dr. Lindzen at 3:21 pm on 02/07/2024 by text page via the [facility] messaging system. IMPRESSION: 1. 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 2. Limited CT perfusion imaging as described above. 3. 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 4. Unchanged 50% stenosis of the origin of the left common carotid artery and 75% stenosis of the brachiocephalic artery. 5. Partially visualized small to moderate bilateral pleural effusions. 6.  Aortic Atherosclerosis (ICD10-I70.0). Electronically Signed   By: Aundra Lee M.D.   On: 02/07/2024 15:40   CT HEAD CODE STROKE WO CONTRAST Result Date: 02/07/2024 CLINICAL DATA:  Code stroke. Neuro deficit, acute, stroke suspected. Fall this morning. Altered mental status. Aphasia, right-sided deficits, and left-sided facial droop. EXAM: CT HEAD WITHOUT CONTRAST TECHNIQUE: Contiguous axial images were obtained from the base of the skull through the vertex without intravenous contrast. RADIATION DOSE REDUCTION: This exam was performed according to the departmental dose-optimization program which includes automated exposure control, adjustment of the mA and/or kV according to patient  size and/or use of iterative reconstruction technique. COMPARISON:  Head MRI 08/03/2018 FINDINGS: Brain: There is evidence of an acute left MCA territory infarct involving portions of the frontal and parietal lobes (most notably at the level of the operculum), insula, caudate, lentiform nucleus, and likely internal capsule. No acute intracranial  hemorrhage, significant midline shift, hydrocephalus, or extra-axial fluid collection is identified. Cerebral white matter hypodensities elsewhere are nonspecific but compatible with mild chronic small vessel ischemic disease. Cerebral volume is within normal limits for age. Vascular: Calcified atherosclerosis at the skull base. Hyperdense left M1 segment. Skull: No fracture or suspicious lesion. Sinuses/Orbits: Visualized paranasal sinuses and mastoid air cells are clear. Bilateral cataract extraction. Other: None. ASPECTS [facility] Stroke Program Early CT Score) - Ganglionic level infarction (caudate, lentiform nuclei, internal capsule, insula, M1-M3 cortex): 1 - Supraganglionic infarction (M4-M6 cortex): 2 Total score (0-10 with 10 being normal): 3 These results were communicated to Dr. Lindzen at 2:33 pm on 02/07/2024 by text page via the [facility] messaging system. IMPRESSION: Acute left MCA territory infarct. ASPECTS of 3. Electronically Signed   By: Aundra Lee M.D.   On: 02/07/2024 14:33   MR LUMBAR SPINE WO CONTRAST Result Date: 02/03/2024 CLINICAL DATA:  Chronic lower back pain radiating into the bilateral legs. Evaluate for spinal stenosis. EXAM: MRI LUMBAR SPINE WITHOUT CONTRAST TECHNIQUE: Multiplanar, multisequence MR imaging of the lumbar spine was performed. No intravenous contrast was administered. COMPARISON:  Lumbar spine radiographs, 05/13/2023; MRI lumbar spine 11/12/2020; chest two views 01/22/2020 FINDINGS: Segmentation: Counting down from T1 on prior chest radiographs, there are tiny ribs at the vertebral body considered T12. Distal to this, the next 5 vertebral bodies are considered L1 through L5. On the current MRI axial series 113, image 41 corresponds to the L5-S1 disc space. This is in agreement with a 11/12/2020 lumbar spine MRI report. There is partial sacralization of L5. Alignment:  There is 2 mm retrolisthesis of L4 on L5, unchanged. Vertebrae: Vertebral body heights are maintained. Disc  spaces are preserved. Decreased disc hydration is greatest at T10-11 and L2-3 through L4-5. Tiny 9 fat intensity anterior superior T12 vertebral body hemangioma is unchanged (sagittal series 206, image 8). Tiny superior L3 endplate degenerative Schmorl's node is unchanged. Conus medullaris and cauda equina: Conus extends to the mid L1 level. Conus and cauda equina appear normal. Paraspinal and other soft tissues: Partially visualized multiple decreased T1 increased T2 signal right renal cysts are again seen, not significantly changed from prior. No follow-up imaging is recommended. Disc levels: L1-2: No posterior disc bulge, central canal narrowing, or neuroforaminal stenosis. L2-3: Mild bilateral facet joint hypertrophy. Mild broad-based posterior disc bulge. Mild flattening of the ventral thecal sac, unchanged. No central canal stenosis. Mild bilateral intraforaminal disc extension. Mild left neuroforaminal stenosis is unchanged to minimally worsened from prior. L3-4: Mild-to-moderate bilateral facet joint and ligamentum flavum hypertrophy. Mild-to-moderate broad-based posterior disc osteophyte complex with bilateral intraforaminal extension. New superimposed left lateral recess posterior disc protrusion measuring up to 4 mm in AP dimension (sagittal series 105, image 10 and axial series 113, image 29) newly minimally posteriorly displaces the descending left L4 nerve within the left lateral recess. New moderate left lateral recess stenosis. Mildly worsened mild-to-moderate left neuroforaminal stenosis. Very mild right neuroforaminal narrowing is similar to prior. No central canal stenosis. L4-5: Postsurgical changes are again seen of right hemilaminotomy. Mild-to-moderate bilateral facet joint hypertrophy. Moderate broad-based posterior disc bulge measure up to 6 mm in AP dimension, similar to prior. Disc minimally impresses  on the descending right L5 nerve within the right lateral recess. Mild-to-moderate right  and mild left lateral recess stenosis. Overall borderline mild central canal stenosis. Very mild bilateral neuroforaminal narrowing without true stenosis. No significant change. L5-S1: Rudimentary disc. No posterior disc bulge, central canal narrowing, or neuroforaminal stenosis. Left L5-S1 transitional assimilation joint (sagittal image 15). IMPRESSION: Compared to 11/12/2020: 1. Transitional lumbosacral anatomy. Recommend correlation with the numbering nomenclature in this report prior to any image guided invasive procedure. 2. At L3-4, there is a new left lateral recess posterior disc protrusion which minimally posteriorly displaces the descending left L4 nerve within the left lateral recess. New moderate left lateral recess stenosis. Mildly worsened mild-to-moderate left neuroforaminal stenosis. 3. At L4-5, there are postsurgical changes of right hemilaminotomy. Moderate broad-based posterior disc bulge minimally impresses on the descending right L5 nerve within the right lateral recess. Mild-to-moderate right and mild left lateral recess stenosis. 4. At L2-3, there is mild left neuroforaminal stenosis, unchanged to minimally worsened from prior. Electronically Signed   By: Bertina Broccoli M.D.   On: 02/03/2024 19:12   VAS US  ABI WITH/WO TBI Result Date: 01/13/2024  LOWER EXTREMITY DOPPLER STUDY Patient Name:  ANIYLA HARLING  Date of Exam:   01/12/2024 Medical Rec #: [id number]       Accession #:    [id number] Date of Birth: 1941/08/09       Patient Gender: F Patient Age:   67 years Exam Location:  [address] Procedure:      VAS US  ABI WITH/WO TBI Referring Phys: Belva Boyden --------------------------------------------------------------------------------  Indications: Peripheral artery disease. Patient complains of a sharp back pain              that causes her legs to give out and fall to her knees. She reports              this can happen while folding laundry, walking or after a car ride               when she tries to stand afterwards. Patient does not complain of              claudication. High Risk Factors: Hypertension, hyperlipidemia. Other Factors: Hx Spine Surgeries/Spinal Cord Compression.  Comparison Study: 10/28/20 Rt Normal; Lt Normal Performing Technologist: Jenifer Miu RVT  Examination Guidelines: A complete evaluation includes at minimum, Doppler waveform signals and systolic blood pressure reading at the level of bilateral brachial, anterior tibial, and posterior tibial arteries, when vessel segments are accessible. Bilateral testing is considered an integral part of a complete examination. Photoelectric Plethysmograph (PPG) waveforms and toe systolic pressure readings are included as required and additional duplex testing as needed. Limited examinations for reoccurring indications may be performed as noted.  ABI Findings: +---------+------------------+-----+-----------+--------+ Right    Rt Pressure (mmHg)IndexWaveform   Comment  +---------+------------------+-----+-----------+--------+ Brachial 187                                        +---------+------------------+-----+-----------+--------+ PTA      198               1.03 multiphasic         +---------+------------------+-----+-----------+--------+ DP       195               1.01 multiphasic         +---------+------------------+-----+-----------+--------+ Rozetta Corns  0.82 Normal              +---------+------------------+-----+-----------+--------+ +---------+------------------+-----+-----------+-------+ Left     Lt Pressure (mmHg)IndexWaveform   Comment +---------+------------------+-----+-----------+-------+ Brachial 193                                       +---------+------------------+-----+-----------+-------+ PTA      195               1.01 multiphasic        +---------+------------------+-----+-----------+-------+ DP       197               1.02 multiphasic         +---------+------------------+-----+-----------+-------+ Great Toe139               0.72 Normal             +---------+------------------+-----+-----------+-------+ +-------+-----------+-----------+------------+------------+ ABI/TBIToday's ABIToday's TBIPrevious ABIPrevious TBI +-------+-----------+-----------+------------+------------+ Right  1.03       0.82       1.13        0.87         +-------+-----------+-----------+------------+------------+ Left   1.02       0.72       1.12        0.83         +-------+-----------+-----------+------------+------------+  Summary: Right: Resting right ankle-brachial index is within normal range. The right toe-brachial index is normal. Left: Resting left ankle-brachial index is within normal range. The left toe-brachial index is normal. Bilateral ABIs and TBIs appear essentially unchanged compared to prior study on 10/28/20.  *See table(s) above for measurements and observations.  Electronically signed by Armida Lander MD on 01/13/2024 at 11:59:22 AM.    Final      PHYSICAL EXAM  Temp:  [96.8 F (36 C)-98.5 F (36.9 C)] 98.5 F (36.9 C) (06/10 1202) Pulse Rate:  [63-84] 69 (06/10 1202) Resp:  [11-24] 15 (06/10 1202) BP: (130-173)/(54-93) 151/54 (06/10 1202) SpO2:  [93 %-98 %] 96 % (06/10 1202) Weight:  [62.4 kg] 62.4 kg (06/09 1534)  General - Well nourished, well developed, in no apparent distress.  Ophthalmologic - fundi not visualized due to noncooperation.  Cardiovascular - Regular rhythm and rate.  Neuro - lethargic and drowsy but able to open eyes on voice, still has global aphasia, not able to follow commands but able to pantomime some commands. No language output only mumbles intermittently without meaning. Left gaze preference but barely cross midline, tracking on the left, not consistent with visual threat bilaterally but seems to have right neglect. R facial droop. Tongue protrusion not cooperative. LUE no drift, and RUE  flaccid. BLE against gravity but right weaker than left. Sensation, coordination not cooperative and gait not tested.   ASSESSMENT/PLAN Ms. JETTA MURRAY is a 83 y.o. female with history of CAD s/p CABG in 2004, DVT, HTN, HLD, PVD, R CEA x 2 admitted for aphasia and right sided weakness. No TNK given due to outside window.    Stroke:  left MCA large infarct embolic likely secondary to large vessel disease, can not rule out cardioembolic source CT L MCA infarct with left MCA hyperdense sign CTA head and neck left ICA occluded at siphon, as well as left MCA and ACA occlusion MRI  left MCA large infarct CT repeat stable left MCA large infarct, no bleeding 2D Echo  pending LDL 199 HgbA1c pending Heparin  subq for  VTE prophylaxis aspirin  81 mg daily prior to admission, now on aspirin  300 mg suppository daily.  Ongoing aggressive stroke risk factor management Therapy recommendations:  CIR Disposition:  pending  ? Atrial fibrillation No clear hx of afib However, last admission in 10/2023, EKG on 3/11 read afib but that was not confirmed by cardiology.  11/2023 follow up with Dr. Gollan cardiology no mentioning of afib Will have cardiology to clarify   CAD s/p CABG Hx of CABG in 2004 Followed with Dr. Jerelene Monday cardiology Echo pending On ASA now  Hypertension Stable Avoid low BP given the left ICA occlusion BP goal < 180/105 Long term BP goal normotensive  Hyperlipidemia Home meds:  crestor  20 and zetia  10  LDL 199, goal < 70 Now on crestor  40 and zetia  10 Continue statin and zetia  at discharge  Dysphagia  Speech on board Currently NPO Pending MBS On IVF  her Stroke Risk Factors Advanced age PVD s/p R CEA x 2 Hx of DVT  Other Active Problems Leukocytosis WBC 11.4->7.2  Hospital day # 1  I had long discussion with husband and son at bedside, updated pt current condition, treatment plan and potential prognosis, and answered all the questions. They expressed understanding  and appreciation. I discussed with Dr. Zelda Hickman. I spent extensive total face-to-face time with the patient and family, reviewing test results, images and medication, and discussing the diagnosis, treatment plan and potential prognosis. This patient's care requiresreview of multiple databases, neurological assessment, discussion with family, other specialists and medical decision making of high complexity.    Consuelo Denmark, MD PhD Stroke Neurology 02/08/2024 12:15 PM    To contact Stroke Continuity provider, please refer to [email or web address]. After hours, contact General Neurology

## 2024-02-08 NOTE — Progress Notes (Addendum)
 PROGRESS NOTE                                                                                                                                                                                                             Patient Demographics:    Catherine Green, is a 83 y.o. female, DOB - 1941/01/20, XLK:[id number]  Outpatient Primary MD for the patient is Donnie Galea, MD    LOS - 1  Admit date - 02/07/2024    Chief Complaint  Patient presents with   Code Stroke       Brief Narrative (HPI from H&P)     83 y.o. female with medical history significant of hypertension, hyperlipidemia, GERD, carotid artery disease, CAD status post CABG, atrial fibrillation, anemia, chronic diastolic CHF, anxiety, depression, neuropathy, COPD, spinal stenosis presened with right-sided weakness and expressive aphasia evaluation of large left MCA stroke..    Subjective:    [name] today in bed, in no distress, ++ expressive aphasia.   Assessment  & Plan :    Right-sided weakness R more than leg, expressive aphasia, right-sided hemineglect.  Due to large left MCA territory infarct.  Full stroke workup, currently n.p.o., PT-OT and speech to see, aspirin  rectal suppository, echo and A1c pending, LDL above goal hence Crestor  dose will be doubled once taking oral medications, as discussed with stroke team, since the size of stroke is moderate to large, 5 days of aspirin  thereafter DOAC if tolerated, will consult cardiology to clarify history of A-fib to guide with anticoagulation.   Hypertension  -permissive hypertension as above   Hyperlipidemia  - Continue rosuvastatin  and Zetia  as tolerated   Carotid artery disease  CAD, status post CABG   Continue ASA, rosuvastatin , Zetia  as tolerated. Holding antihypertensives as above   ??  HX of atrial tachycardia versus paroxysmal atrial fibrillation.  [place] vas 2 score of greater than 5. - Holding  metoprolol  as above, not on chronic anticoagulation.  Mentioned in previous charts, will consult cardiology for their input.  Discharge summary from [facility] mentions A-fib, however patient and family members not sure, will get cardiology input guidance with correct anticoagulation regimen.   Chronic diastolic CHF  > Last echo was in March with EF 60-65%, indeterminate diastolic function, normal RV function. Holding Lasix  as above   Anxiety - Depression  - Continue  sertraline  if able   Neuropathy  - Continue gabapentin  as able   COPD   - Continue home Breo and albuterol           Condition - Extremely Guarded  Family Communication  : Husband in detail on 02/08/2024  Code Status :  Full  Consults  :  Neuro, cardiology  PUD Prophylaxis :     Procedures  :     TTE  CTA- 1. 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 2. Limited CT perfusion imaging as described above. 3. 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 4. Unchanged 50% stenosis of the origin of the left common carotid artery and 75% stenosis of the brachiocephalic artery. 5. Partially visualized small to moderate bilateral pleural effusions. 6.  Aortic Atherosclerosis (ICD10-I70.0).   MRI - 1. Moderate sized acute left MCA territory infarct. No associated hemorrhage or significant regional mass effect. 2. Loss of normal flow void within the left ICA and MCA, consistent with occlusion as seen on prior CTA. 3. Underlying mild to moderate chronic microvascular ischemic disease.      Disposition Plan  :    Status is: Inpatient   DVT Prophylaxis  :    SCD's Start: 02/07/24 1625    Lab Results  Component Value Date   PLT 305 02/08/2024    Diet :  Diet Order             Diet NPO time specified  Diet effective now                    Inpatient Medications  Scheduled Meds:   stroke: early stages of recovery book    Does not apply Once   aspirin   300 mg Rectal Daily   Or   aspirin  EC  81 mg Oral Daily   ezetimibe   10 mg Oral Daily   fluticasone  furoate-vilanterol  1 puff Inhalation Daily   gabapentin   400 mg Oral TID   [START ON 02/09/2024] rosuvastatin   40 mg Oral Daily   sertraline   150 mg Oral Daily   Continuous Infusions:  sodium chloride      PRN Meds:.acetaminophen  **OR** acetaminophen  (TYLENOL ) oral liquid 160 mg/5 mL **OR** acetaminophen , albuterol , senna-docusate  Antibiotics  :    Anti-infectives (From admission, onward)    None         Objective:   Vitals:   02/08/24 0400 02/08/24 0439 02/08/24 0725 02/08/24 0755  BP: (!) 154/71 (!) 155/75 (!) 156/57 (!) 150/55  Pulse: 84 80 79 78  Resp: 19 18 (!) 24 18  Temp:  (!) 97.5 F (36.4 C) 98 F (36.7 C) 98.3 F (36.8 C)  TempSrc:  Oral Oral Oral  SpO2: 94% 97% 98% 95%  Weight:      Height:        Wt Readings from Last 3 Encounters:  02/07/24 62.4 kg  01/28/24 60.8 kg  12/20/23 60 kg     Intake/Output Summary (Last 24 hours) at 02/08/2024 0904 Last data filed at 02/07/2024 2346 Gross per 24 hour  Intake 0 ml  Output --  Net 0 ml     Physical Exam  Awake Alert,   R sided weakness Arm >> leg, expressive aphasia, right-sided neglect [place].AT,PERRAL Supple Neck, No JVD,   Symmetrical Chest wall movement, Good air movement bilaterally, CTAB RRR,No Gallops,Rubs or new Murmurs,  +ve B.Sounds, Abd Soft, No tenderness,   No  Cyanosis, Clubbing or edema       Data Review:    Recent Labs  Lab 02/07/24 1419 02/07/24 1437 02/07/24 1451 02/08/24 0525  WBC 11.4*  --   --  7.2  HGB 12.8 13.9 14.6 13.4  HCT 40.8 41.0 43.0 41.9  PLT 281  --   --  305  MCV 86.4  --   --  84.0  MCH 27.1  --   --  26.9  MCHC 31.4  --   --  32.0  RDW 13.7  --   --  14.1  LYMPHSABS 0.9  --   --   --   MONOABS 0.6  --   --   --   EOSABS 0.0  --   --   --   BASOSABS 0.0  --   --   --     Recent Labs  Lab 02/07/24 1419  02/07/24 1437 02/07/24 1451 02/07/24 1452 02/08/24 0525 02/08/24 0712  NA 137 139 138  --  141  --   K 4.2 4.2 3.6  --  3.9  --   CL 104 102 102  --  102  --   CO2 24  --   --   --  25  --   ANIONGAP 9  --   --   --  14  --   GLUCOSE 142* 143* 225*  --  154*  --   BUN 15 17 16   --  16  --   CREATININE 0.93 1.00 0.90  --  1.10*  --   AST 22  --   --   --  25  --   ALT 14  --   --   --  15  --   ALKPHOS 101  --   --   --  110  --   BILITOT 0.4  --   --   --  0.7  --   ALBUMIN 4.0  --   --   --  4.0  --   LATICACIDVEN  --   --   --  1.6  --   --   INR 1.0  --   --   --   --   --   MG  --   --   --   --   --  2.5*  CALCIUM  8.6*  --   --   --  9.3  --       Recent Labs  Lab 02/07/24 1419 02/07/24 1452 02/08/24 0525 02/08/24 0712  LATICACIDVEN  --  1.6  --   --   INR 1.0  --   --   --   MG  --   --   --  2.5*  CALCIUM  8.6*  --  9.3  --     --------------------------------------------------------------------------------------------------------------- Lab Results  Component Value Date   CHOL 283 (H) 02/08/2024   HDL 70 02/08/2024   LDLCALC 199 (H) 02/08/2024   LDLDIRECT 139.1 04/11/2012   TRIG 71 02/08/2024   CHOLHDL 4.0 02/08/2024    Lab Results  Component Value Date   HGBA1C 5.8 (A) 02/07/2021    Radiology Report CT HEAD POST STROKE FOLLOWUP/TIMED/STAT READ Result Date: 02/08/2024 CLINICAL DATA:  Follow-up examination for neuro deficit, stroke. EXAM: CT HEAD WITHOUT CONTRAST TECHNIQUE: Contiguous axial images were obtained from the base of the skull through the vertex without intravenous contrast. RADIATION DOSE REDUCTION: This exam was performed according to the departmental dose-optimization program  which includes automated exposure control, adjustment of the mA and/or kV according to patient size and/or use of iterative reconstruction technique. COMPARISON:  Comparison made with MRI from 02/07/2024 FINDINGS: Brain: Evolving cytotoxic edema involving the left  frontal lobe, consistent with an evolving acute left MCA distribution infarct, stable from prior MRI. No evidence for hemorrhagic transformation. Localized edema with partial effacement of the left lateral ventricle, but no significant midline shift, relatively similar to prior. No other acute intracranial hemorrhage. No other acute large vessel territory infarct. No mass lesion or hydrocephalus. No extra-axial fluid collection. Vascular: Asymmetric hyperdense left MCA, consistent with previously identified occlusion. Skull: Scalp soft tissues and calvarium demonstrate no new finding. Sinuses/Orbits: Globes orbital soft tissues demonstrate no acute finding. Paranasal sinuses and mastoid air cells remain largely clear. Other: None. IMPRESSION: 1. Evolving acute left MCA distribution infarct, not significantly changed from recent MRI. No evidence for hemorrhagic transformation. Localized edema with partial effacement of the left lateral ventricle, but no significant midline shift. 2. No other new acute intracranial abnormality. Electronically Signed   By: Virgia Griffins M.D.   On: 02/08/2024 01:32   MR BRAIN WO CONTRAST Result Date: 02/07/2024 CLINICAL DATA:  Follow-up examination for stroke. EXAM: MRI HEAD WITHOUT CONTRAST TECHNIQUE: Multiplanar, multiecho pulse sequences of the brain and surrounding structures were obtained without intravenous contrast. COMPARISON:  CTs from earlier the same day. FINDINGS: Brain: Cerebral volume within normal limits. Patchy T2/FLAIR hyperintensity involving the periventricular and deep white matter both cerebral hemispheres the consistent with chronic small vessel ischemic disease, mild to moderate in nature. Confluent restricted diffusion involving the left frontal lobe, consistent with a moderate size evolving left MCA territory infarct. Involvement of the left insula and left basal ganglia noted. No associated hemorrhage or significant regional mass effect. No other  evidence for acute or subacute ischemia. No acute intracranial hemorrhage. Few punctate chronic micro hemorrhages noted involving the right cerebellum and right cerebral hemisphere. No mass lesion, midline shift or mass effect. No hydrocephalus or extra-axial fluid collection. Pituitary gland within normal limits. Vascular: Loss of normal flow void within the left ICA and MCA, consistent with occlusion as seen on prior CTA. Major intracranial vascular flow voids are otherwise maintained. Skull and upper cervical spine: Craniocervical junction within normal limits. Bone marrow signal intensity normal. No scalp soft tissue abnormality. Sinuses/Orbits: Prior bilateral ocular lens replacement. Paranasal sinuses are largely clear. No mastoid effusion. Other: None. IMPRESSION: 1. Moderate sized acute left MCA territory infarct. No associated hemorrhage or significant regional mass effect. 2. Loss of normal flow void within the left ICA and MCA, consistent with occlusion as seen on prior CTA. 3. Underlying mild to moderate chronic microvascular ischemic disease. Electronically Signed   By: Virgia Griffins M.D.   On: 02/07/2024 20:30   CT ANGIO HEAD NECK W WO CM W PERF (CODE STROKE) Result Date: 02/07/2024 CLINICAL DATA:  Neuro deficit, acute, stroke suspected. EXAM: CT ANGIOGRAPHY HEAD AND NECK CT PERFUSION BRAIN TECHNIQUE: Multidetector CT imaging of the head and neck was performed using the standard protocol during bolus administration of intravenous contrast. Multiplanar CT image reconstructions and MIPs were obtained to evaluate the vascular anatomy. Carotid stenosis measurements (when applicable) are obtained utilizing NASCET criteria, using the distal internal carotid diameter as the denominator. Multiphase CT imaging of the brain was performed following IV bolus contrast injection. Subsequent parametric perfusion maps were calculated using RAPID software. RADIATION DOSE REDUCTION: This exam was performed  according to the departmental dose-optimization program which  includes automated exposure control, adjustment of the mA and/or kV according to patient size and/or use of iterative reconstruction technique. CONTRAST:  OMNIPAQUE IOHEXOL 350 MG/ML SOLN COMPARISON:  CTA neck 04/22/2020 FINDINGS: CTA NECK FINDINGS Aortic arch: Standard branching with prominent calcified plaque involving the great vessel origins including a proximally 75% stenosis of the origin of the brachiocephalic artery, similar to the prior CTA. 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 Similar appearance of calcified and ulcerated soft plaque in this region. Widely patent ICA. Left carotid system: Patent with 50% stenosis of the common carotid artery origin and less than 50% stenosis of the proximal ICA, similar to the prior CTA. Vertebral arteries: Patent without evidence of a significant stenosis or dissection within limitation of streak artifact which obscures a portion of the right V1 segment. Element left vertebral artery. Skeleton: C5-C7 ACDF. Advanced upper cervical facet arthrosis. C3-4 facet ankylosis. Other neck: No evidence of cervical lymphadenopathy or mass. Upper chest: Partially visualized small to moderate bilateral pleural effusions with associated atelectasis. Status post CABG. Review of the MIP images confirms the above findings CTA HEAD FINDINGS Anterior circulation: The intracranial left ICA is patent proximally, however there is progressively diminished opacification more distally with the vessel appearing occluded beginning in the cavernous segment through the terminus. The left M1 and proximal left A1 segments are also occluded. There is reconstitution of the left ACA via the anterior communicating artery. There is mild-to-moderate reconstitution of the distal left M1  segment and left MCA branch vessels. The intracranial right ICA is patent with mild nonstenotic atherosclerosis. The right ACA and right MCA are patent without evidence of a significant proximal stenosis. No aneurysm is identified. Posterior circulation: The intracranial vertebral arteries are patent to the basilar with the right being hypoplastic distal to the PICA origin. Patent PICA and SCA origins are visualized bilaterally. The basilar artery is patent and mildly irregular without a significant stenosis. Posterior communicating arteries are diminutive or absent. Both PCAs are patent without evidence of a significant proximal stenosis. No aneurysm is identified. Venous sinuses: As permitted by contrast timing, patent. Anatomic variants: None. Review of the MIP images confirms the above findings CT Brain Perfusion Findings: CT perfusion imaging is felt to be of limited diagnostic utility due to bolus timing, with the arterial upstroke being incompletely captured and without a good venous outflow curve. The perfusion software does not detect the large left MCA infarct which is apparent on the earlier noncontrast head CT and reports 22 mL of mismatch volume. These results were communicated to Dr. Lindzen at 3:21 pm on 02/07/2024 by text page via the [facility] messaging system. IMPRESSION: 1. 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 2. Limited CT perfusion imaging as described above. 3. 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 4. Unchanged 50% stenosis of the origin of the left common carotid artery and 75% stenosis of the brachiocephalic artery. 5. Partially visualized small to moderate bilateral pleural effusions. 6.  Aortic Atherosclerosis (ICD10-I70.0). Electronically Signed   By: Aundra Lee M.D.   On: 02/07/2024 15:40   CT HEAD CODE STROKE WO CONTRAST Result Date: 02/07/2024 CLINICAL DATA:  Code stroke. Neuro  deficit, acute, stroke suspected. Fall this morning. Altered mental status. Aphasia, right-sided deficits, and left-sided facial droop. EXAM: CT HEAD WITHOUT CONTRAST TECHNIQUE:  Contiguous axial images were obtained from the base of the skull through the vertex without intravenous contrast. RADIATION DOSE REDUCTION: This exam was performed according to the departmental dose-optimization program which includes automated exposure control, adjustment of the mA and/or kV according to patient size and/or use of iterative reconstruction technique. COMPARISON:  Head MRI 08/03/2018 FINDINGS: Brain: There is evidence of an acute left MCA territory infarct involving portions of the frontal and parietal lobes (most notably at the level of the operculum), insula, caudate, lentiform nucleus, and likely internal capsule. No acute intracranial hemorrhage, significant midline shift, hydrocephalus, or extra-axial fluid collection is identified. Cerebral white matter hypodensities elsewhere are nonspecific but compatible with mild chronic small vessel ischemic disease. Cerebral volume is within normal limits for age. Vascular: Calcified atherosclerosis at the skull base. Hyperdense left M1 segment. Skull: No fracture or suspicious lesion. Sinuses/Orbits: Visualized paranasal sinuses and mastoid air cells are clear. Bilateral cataract extraction. Other: None. ASPECTS [facility] Stroke Program Early CT Score) - Ganglionic level infarction (caudate, lentiform nuclei, internal capsule, insula, M1-M3 cortex): 1 - Supraganglionic infarction (M4-M6 cortex): 2 Total score (0-10 with 10 being normal): 3 These results were communicated to Dr. Lindzen at 2:33 pm on 02/07/2024 by text page via the [facility] messaging system. IMPRESSION: Acute left MCA territory infarct. ASPECTS of 3. Electronically Signed   By: Aundra Lee M.D.   On: 02/07/2024 14:33     Signature  -   Lynnwood Sauer M.D on 02/08/2024 at 9:04 AM   -  To page go to [email or web address]

## 2024-02-08 NOTE — Evaluation (Signed)
 Occupational Therapy Evaluation Patient Details Name: Catherine Green MRN: [id number] DOB: 22-Dec-1940 Today's Date: 02/08/2024   History of Present Illness   Patient is a 83 year old female with right side weakness, expressive aphasia, found to have large MCA stroke. History of HTN, GERD, CAD s/p CABG, A-fib, anemia, CHF, anxiety, depression, neuropathy, COPD, spinal stenosis s/p back surgery.     Clinical Impressions PTA, pt lives with spouse, typically Modified Independent with ADLs, shares IADLs and uses walker for mobility. Pt presents now with deficits in R sided strength, cognition/communication, balance and endurance. Pt following one step commands variably, noted hypertonicity in R hand/elbow though PROM WFL. Pt requiring Mod A x 2 for bed mobility, standing at bedside and attempts at steps along bedside. Pt requiring overall Max A for ADLs d/t deficits. Pt's husband at bedside and supportive. Feel pt would benefit from intensive rehab services prior to DC home.      If plan is discharge home, recommend the following:   A lot of help with walking and/or transfers;Two people to help with walking and/or transfers;A lot of help with bathing/dressing/bathroom     Functional Status Assessment   Patient has had a recent decline in their functional status and demonstrates the ability to make significant improvements in function in a reasonable and predictable amount of time.     Equipment Recommendations   Other (comment) (TBD)     Recommendations for Other Services   Rehab consult     Precautions/Restrictions   Precautions Precautions: Fall Restrictions Weight Bearing Restrictions Per Provider Order: No     Mobility Bed Mobility Overal bed mobility: Needs Assistance Bed Mobility: Supine to Sit, Sit to Supine     Supine to sit: Mod assist, +2 for physical assistance Sit to supine: Mod assist, +2 for physical assistance   General bed mobility comments: multi  modal cues required. increased time and effort    Transfers Overall transfer level: Needs assistance Equipment used: 2 person hand held assist Transfers: Sit to/from Stand Sit to Stand: Mod assist, +2 physical assistance           General transfer comment: cues for technique. 2 standing bouts performed      Balance Overall balance assessment: Needs assistance Sitting-balance support: Feet supported Sitting balance-Leahy Scale: Poor Sitting balance - Comments: occasional pushing to the right. intermittent external support required   Standing balance support: Bilateral upper extremity supported Standing balance-Leahy Scale: Poor Standing balance comment: up to +2 person assistance for safety                           ADL either performed or assessed with clinical judgement   ADL Overall ADL's : Needs assistance/impaired Eating/Feeding: NPO   Grooming: Maximal assistance;Sitting   Upper Body Bathing: Maximal assistance;Sitting   Lower Body Bathing: Total assistance;Bed level   Upper Body Dressing : Maximal assistance;Sitting   Lower Body Dressing: Total assistance;Bed level       Toileting- Clothing Manipulation and Hygiene: Total assistance;Bed level               Vision Ability to See in Adequate Light: 2 Moderately impaired Patient Visual Report: Other (comment) (R inattention) Vision Assessment?: Vision impaired- to be further tested in functional context;Yes Eye Alignment: Within Functional Limits Ocular Range of Motion: Restricted on the right Alignment/Gaze Preference: Gaze left Tracking/Visual Pursuits: Impaired - to be further tested in functional context Visual Fields: Right visual field deficit  Additional Comments: L gaze preference. able to locate midline and track to R side w/ stimulus. does not spontaneously look to R side     Perception         Praxis         Pertinent Vitals/Pain Pain Assessment Pain Assessment:  Faces Faces Pain Scale: No hurt     Extremity/Trunk Assessment Upper Extremity Assessment Upper Extremity Assessment: Right hand dominant;RUE deficits/detail RUE Deficits / Details: no active movement noted. hypertonicity in hand and with elbow flexion but PROM WFL. Pt initiating self ROM using L hand w/ Min assist for technique. no responses to tactile stimulation RUE Sensation: decreased light touch;decreased proprioception RUE Coordination: decreased fine motor;decreased gross motor   Lower Extremity Assessment Lower Extremity Assessment: Defer to PT evaluation RLE Deficits / Details: unable to follow commands for formal testing. can complete partial SLR. hypertonicity with PROM. partial weight bearing with standing without knee buckling RLE Sensation: history of peripheral neuropathy RLE Coordination: decreased fine motor;decreased gross motor LLE Deficits / Details: weight bearing without knee buckling. unable to formall assess due to impaired cognition   Cervical / Trunk Assessment Cervical / Trunk Assessment: Normal   Communication Communication Communication: Impaired Factors Affecting Communication: Difficulty expressing self   Cognition Arousal: Alert Behavior During Therapy: Flat affect Cognition: Difficult to assess Difficult to assess due to: Impaired communication           OT - Cognition Comments: able to follow one step commands 60% of the time. slight smile noted in reference to a joke by husband. did attempt to mouth words once but unintelligible                 Following commands: Impaired Following commands impaired: Follows one step commands with increased time     Cueing  General Comments   Cueing Techniques: Verbal cues;Tactile cues;Visual cues  Spouse present. educated on stimulation on R visual field, stretching RUE   Exercises     Shoulder Instructions      Home Living Family/patient expects to be discharged to:: Private  residence Living Arrangements: Spouse/significant other Available Help at Discharge: Family Type of Home:  (condo) Home Access: Therapist, sports Shower/Tub: Walk-in Soil scientist Toilet: Handicapped height     Home Equipment: Agricultural consultant (2 wheels);Rollator (4 wheels);Shower seat;Hand held shower head          Prior Functioning/Environment Prior Level of Function : Independent/Modified Independent;History of Falls (last six months)             Mobility Comments: rollator for walking. ADLs Comments: Mod I for ADLs, shares IADLs.    OT Problem List: Decreased strength;Decreased activity tolerance;Impaired balance (sitting and/or standing);Impaired vision/perception;Decreased coordination;Decreased cognition;Decreased knowledge of use of DME or AE;Impaired UE functional use;Impaired tone;Impaired sensation   OT Treatment/Interventions: Self-care/ADL training;Therapeutic exercise;Energy conservation;DME and/or AE instruction;Therapeutic activities;Patient/family education;Balance training;Manual therapy;Splinting;Neuromuscular education      OT Goals(Current goals can be found in the care plan section)   Acute Rehab OT Goals Patient Stated Goal: husband hoping for pt improvements, be able to go home OT Goal Formulation: With family Time For Goal Achievement: 02/22/24 Potential to Achieve Goals: Good   OT Frequency:  Min 2X/week    Co-evaluation PT/OT/SLP Co-Evaluation/Treatment: Yes Reason for Co-Treatment: To address functional/ADL transfers PT goals addressed during session: Mobility/safety with mobility OT goals addressed during session: ADL's and self-care;Proper use of Adaptive equipment and DME  AM-PAC OT "6 Clicks" Daily Activity     Outcome Measure Help from another person eating meals?: Total Help from another person taking care of personal grooming?: A Lot Help from another person toileting, which includes using toliet, bedpan,  or urinal?: Total Help from another person bathing (including washing, rinsing, drying)?: A Lot Help from another person to put on and taking off regular upper body clothing?: A Lot Help from another person to put on and taking off regular lower body clothing?: Total 6 Click Score: 9   End of Session    Activity Tolerance: Patient tolerated treatment well Patient left: in bed;with call bell/phone within reach;with bed alarm set;with family/visitor present  OT Visit Diagnosis: Unsteadiness on feet (R26.81);Other abnormalities of gait and mobility (R26.89);Muscle weakness (generalized) (M62.81);Hemiplegia and hemiparesis Hemiplegia - Right/Left: Right Hemiplegia - dominant/non-dominant: Dominant Hemiplegia - caused by: Cerebral infarction                Time: 6045-4098 OT Time Calculation (min): 22 min Charges:  OT General Charges $OT Visit: 1 Visit OT Evaluation $OT Eval Moderate Complexity: 1 Mod  Lawrence Pretty, OTR/L Acute Rehab Services Office: [phone]   Catherine Green 02/08/2024, 10:57 AM

## 2024-02-08 NOTE — Evaluation (Signed)
 Physical Therapy Evaluation Patient Details Name: Catherine Green MRN: [id number] DOB: June 13, 1941 Today's Date: 02/08/2024  History of Present Illness  Patient is a 83 year old female with right side weakness, expressive aphasia, found to have large MCA stroke. History of HTN, GERD, CAD s/p CABG, A-fib, anemia, CHF, anxiety, depression, neuropathy, COPD, spinal stenosis s/p back surgery.  Clinical Impression  PT evaluation completed. Supportive spouse at the bedside provided information. The patient is Mod I for mobility with rollator for ambulation at baseline. History of falls related to spinal stenosis. They have an elevator entrance to the home.  Today the patient has right side weakness and aphasia. She required +2 person for bed mobility and transfers. Pre-gait activity performed with facilitation for weight shifting. Unable to take steps at this time. Recommend to continue PT to maximize independence and decrease caregiver burden. Rehabilitation > 3 hours/day recommended after this hospital stay.      If plan is discharge home, recommend the following: A lot of help with walking and/or transfers;A lot of help with bathing/dressing/bathroom;Assistance with cooking/housework;Assist for transportation;Supervision due to cognitive status;Help with stairs or ramp for entrance   Can travel by private vehicle        Equipment Recommendations Wheelchair (measurements PT);Hospital bed;BSC/3in1;Wheelchair cushion (measurements PT)  Recommendations for Other Services  Rehab consult    Functional Status Assessment Patient has had a recent decline in their functional status and demonstrates the ability to make significant improvements in function in a reasonable and predictable amount of time.     Precautions / Restrictions Precautions Precautions: Fall Restrictions Weight Bearing Restrictions Per Provider Order: No      Mobility  Bed Mobility Overal bed mobility: Needs Assistance Bed  Mobility: Supine to Sit, Sit to Supine     Supine to sit: Mod assist, +2 for physical assistance Sit to supine: Mod assist, +2 for physical assistance   General bed mobility comments: multi modal cues required. increased time and effort    Transfers Overall transfer level: Needs assistance Equipment used: 2 person hand held assist Transfers: Sit to/from Stand Sit to Stand: Mod assist, +2 physical assistance           General transfer comment: cues for technique. 2 standing bouts performed    Ambulation/Gait             Pre-gait activities: weight shifting faciliation provided for off loading for attempts to side step. unable to clear either foot to take a true step. patient is able to slide left foot minimally to the left to narrow stance General Gait Details: unable to at this time  Stairs            Wheelchair Mobility     Tilt Bed    Modified Rankin (Stroke Patients Only)       Balance Overall balance assessment: Needs assistance Sitting-balance support: Feet supported Sitting balance-Leahy Scale: Poor Sitting balance - Comments: occasional pushing to the right. intermittent external support required   Standing balance support: Bilateral upper extremity supported Standing balance-Leahy Scale: Poor Standing balance comment: up to +2 person assistance for safety                             Pertinent Vitals/Pain      Home Living Family/patient expects to be discharged to:: Private residence Living Arrangements: Spouse/significant other Available Help at Discharge: Family Type of Home:  (condo) Home Access: Elevator  Home Equipment: Agricultural consultant (2 wheels);Rollator (4 wheels);Shower seat;Hand held shower head      Prior Function Prior Level of Function : Independent/Modified Independent;History of Falls (last six months)             Mobility Comments: rollator for walking. ADLs Comments: Mod I      Extremity/Trunk Assessment   Upper Extremity Assessment Upper Extremity Assessment: Defer to OT evaluation    Lower Extremity Assessment Lower Extremity Assessment: RLE deficits/detail;LLE deficits/detail RLE Deficits / Details: unable to follow commands for formal testing. can complete partial SLR. hypertonicity with PROM. partial weight bearing with standing without knee buckling RLE Sensation: history of peripheral neuropathy RLE Coordination: decreased fine motor;decreased gross motor LLE Deficits / Details: weight bearing without knee buckling. unable to formall assess due to impaired cognition       Communication   Communication Communication: Impaired Factors Affecting Communication: Difficulty expressing self    Cognition Arousal: Alert Behavior During Therapy: Flat affect   PT - Cognitive impairments: Difficult to assess Difficult to assess due to: Impaired communication                     PT - Cognition Comments: multi modal cues provided Following commands: Impaired Following commands impaired: Follows one step commands with increased time     Cueing Cueing Techniques: Verbal cues, Tactile cues, Visual cues     General Comments General comments (skin integrity, edema, etc.): patient has left gaze preference but is able to look to the right with cues. spouse education on environmental stimulation to facilitate increased right side awareness.    Exercises     Assessment/Plan    PT Assessment Patient needs continued PT services  PT Problem List Decreased strength;Decreased range of motion;Decreased activity tolerance;Decreased balance;Decreased mobility;Decreased coordination;Decreased cognition;Decreased knowledge of use of DME;Decreased safety awareness;Decreased knowledge of precautions       PT Treatment Interventions DME instruction;Gait training;Stair training;Functional mobility training;Therapeutic activities;Therapeutic exercise;Balance  training;Cognitive remediation;Neuromuscular re-education;Patient/family education;Wheelchair mobility training    PT Goals (Current goals can be found in the Care Plan section)  Acute Rehab PT Goals Patient Stated Goal: spouse goal is to regain independence to go home with assistance PT Goal Formulation: With family Time For Goal Achievement: 02/22/24 Potential to Achieve Goals: Fair    Frequency Min 3X/week     Co-evaluation PT/OT/SLP Co-Evaluation/Treatment: Yes Reason for Co-Treatment: To address functional/ADL transfers PT goals addressed during session: Mobility/safety with mobility         AM-PAC PT "6 Clicks" Mobility  Outcome Measure Help needed turning from your back to your side while in a flat bed without using bedrails?: A Lot Help needed moving from lying on your back to sitting on the side of a flat bed without using bedrails?: Total Help needed moving to and from a bed to a chair (including a wheelchair)?: Total Help needed standing up from a chair using your arms (e.g., wheelchair or bedside chair)?: Total Help needed to walk in hospital room?: Total Help needed climbing 3-5 steps with a railing? : Total 6 Click Score: 7    End of Session   Activity Tolerance: Patient limited by fatigue Patient left: in bed;with call bell/phone within reach;with bed alarm set;with family/visitor present Nurse Communication: Mobility status PT Visit Diagnosis: Hemiplegia and hemiparesis Hemiplegia - Right/Left: Right Hemiplegia - dominant/non-dominant: Dominant Hemiplegia - caused by: Cerebral infarction    Time: 9629-5284 PT Time Calculation (min) (ACUTE ONLY): 23 min   Charges:  PT Evaluation $PT Eval Moderate Complexity: 1 Mod   PT General Charges $$ ACUTE PT VISIT: 1 Visit        Ozie Bo, PT, MPT   Erlene Hawks 02/08/2024, 9:48 AM

## 2024-02-08 NOTE — Progress Notes (Signed)
 Inpatient Rehab Admissions Coordinator Note:   Per PT recommendations patient was screened for CIR candidacy by Mickey Alar, PT. At this time, pt appears to be a potential candidate for CIR. I will place an order for rehab consult for full assessment, per our protocol.  Please contact me any with questions.Loye Rumble, PT, DPT [phone] 02/08/24 10:59 AM

## 2024-02-09 ENCOUNTER — Telehealth (HOSPITAL_COMMUNITY): Payer: Self-pay | Admitting: Pharmacy Technician

## 2024-02-09 ENCOUNTER — Inpatient Hospital Stay (HOSPITAL_COMMUNITY)

## 2024-02-09 ENCOUNTER — Other Ambulatory Visit (HOSPITAL_COMMUNITY): Payer: Self-pay

## 2024-02-09 DIAGNOSIS — I779 Disorder of arteries and arterioles, unspecified: Secondary | ICD-10-CM | POA: Diagnosis not present

## 2024-02-09 DIAGNOSIS — I6389 Other cerebral infarction: Secondary | ICD-10-CM | POA: Diagnosis not present

## 2024-02-09 DIAGNOSIS — I4892 Unspecified atrial flutter: Secondary | ICD-10-CM

## 2024-02-09 DIAGNOSIS — I2510 Atherosclerotic heart disease of native coronary artery without angina pectoris: Secondary | ICD-10-CM | POA: Diagnosis not present

## 2024-02-09 DIAGNOSIS — I63412 Cerebral infarction due to embolism of left middle cerebral artery: Secondary | ICD-10-CM | POA: Diagnosis not present

## 2024-02-09 DIAGNOSIS — I639 Cerebral infarction, unspecified: Secondary | ICD-10-CM | POA: Diagnosis not present

## 2024-02-09 LAB — HEMOGLOBIN A1C
Hgb A1c MFr Bld: 5.8 % — ABNORMAL HIGH (ref 4.8–5.6)
Mean Plasma Glucose: 120 mg/dL

## 2024-02-09 LAB — ECHOCARDIOGRAM COMPLETE
AR max vel: 1.28 cm2
AV Area VTI: 1.17 cm2
AV Area mean vel: 1.25 cm2
AV Mean grad: 8 mmHg
AV Peak grad: 13.9 mmHg
Ao pk vel: 1.87 m/s
Area-P 1/2: 4.15 cm2
Height: 62 in
MV M vel: 4.78 m/s
MV Peak grad: 91.4 mmHg
S' Lateral: 2.5 cm
Weight: 2201.07 [oz_av]

## 2024-02-09 MED ORDER — APIXABAN 5 MG PO TABS
5.0000 mg | ORAL_TABLET | Freq: Two times a day (BID) | ORAL | Status: DC
  Administered 2024-02-09 – 2024-02-14 (×11): 5 mg via ORAL
  Filled 2024-02-09 (×12): qty 1

## 2024-02-09 MED ORDER — FOOD THICKENER (SIMPLYTHICK): 1.0000 | ORAL | Status: DC | PRN

## 2024-02-09 MED ORDER — SODIUM CHLORIDE 0.9 % IV SOLN: INTRAVENOUS | Status: AC

## 2024-02-09 NOTE — Progress Notes (Signed)
 Inpatient Rehab Admissions Coordinator:    CIR consult received. Spoke with Pt.'s husband, he can provide only supervision at home. Today, pt. Was too lethargic to participate. I will see how she does over the course of the next few sessions.   Catherine Green

## 2024-02-09 NOTE — Progress Notes (Signed)
 Occupational Therapy Treatment Patient Details Name: Catherine Green MRN: [id number] DOB: 1941/07/06 Today's Date: 02/09/2024   History of present illness Patient is a 83 year old female with right side weakness, expressive aphasia, found to have large MCA stroke. History of HTN, GERD, CAD s/p CABG, A-fib, anemia, CHF, anxiety, depression, neuropathy, COPD, spinal stenosis s/p back surgery.   OT comments  With increased attention and cues to facilitate engagement of RUE, pt able to demonstrate minimal activation of shoulder mobility, R scapula noted to be winging. Pt making efforts to utilize RUE during grooming tasks in recliner, still requires max A hand over hand assist secondary to RUE deficits and global aphasia but able to comprehend following repetitive gestural and visual cues. No AROM noted from pt's wrist and digits, placed order for R resting hand splint to protect joints and ligaments.  Patient has the potential to reach Mod I and demos the ability to tolerate 3 hours of therapy. Pt would benefit from an intensive rehab program to help maximize functional independence.       If plan is discharge home, recommend the following:  A lot of help with walking and/or transfers;Two people to help with walking and/or transfers;A lot of help with bathing/dressing/bathroom;Direct supervision/assist for financial management;Direct supervision/assist for medications management;Supervision due to cognitive status   Equipment Recommendations  BSC/3in1;Wheelchair (measurements OT);Wheelchair cushion (measurements OT);Hospital bed;Tub/shower seat    Recommendations for Other Services      Precautions / Restrictions Precautions Precautions: Fall Restrictions Weight Bearing Restrictions Per Provider Order: No       Mobility Bed Mobility               General bed mobility comments: Pt in recliner on arrival    Transfers                         Balance       Sitting  balance - Comments: in recliner, not fully assessed.                                   ADL either performed or assessed with clinical judgement   ADL       Grooming: Sitting;Maximal assistance;Oral care Grooming Details (indicate cue type and reason): max A hand over hand assist to apply lotion to LUE using her RUE, pt initiating some effort to complete task. Applied lotion to RUE using her LUE, needed multi modal cues to engage in task on R side. able to use LUE to demonstrate use of toothbrush.                               General ADL Comments: Pt distinguishing objects (tooth brush and tooth paste) from eachother.    Extremity/Trunk Assessment Upper Extremity Assessment RUE Deficits / Details: hypertonicity with elbow flexion PROM. Pt demonstrating activation of R shoulder elevation and initating some adduction. No firing of the biceps/triceps noted, no AROM of wrist and digits. Min scapular protraction and retraction. Needs increased cues to perform tasks with R side. RUE Sensation: decreased light touch;decreased proprioception RUE Coordination: decreased fine motor;decreased gross motor            Vision       Perception Perception Perception: Impaired Preception Impairment Details: Inattention/Neglect;Spatial orientation Perception-Other Comments: R inattention.   Praxis Praxis Praxis: Impaired Praxis Impairment  Details: Ideomotor Praxis-Other Comments: able to demonstrate the proper use of toothbrush.   Communication Communication Communication: Impaired Factors Affecting Communication: Difficulty expressing self   Cognition Arousal: Lethargic Behavior During Therapy: Flat affect Cognition: Difficult to assess Difficult to assess due to: Impaired communication (global aphasia)           OT - Cognition Comments: able to follow simple one step commands with repetitive cueing and demonstations.                 Following  commands: Impaired Following commands impaired: Follows one step commands with increased time, Follows one step commands inconsistently      Cueing   Cueing Techniques: Verbal cues, Tactile cues, Visual cues, Gestural cues  Exercises      Shoulder Instructions       General Comments Pt spouse and daughter in law present and supportive. RUE elevated on pillows. oriented pt to call bell.    Pertinent Vitals/ Pain       Pain Assessment Pain Assessment: Faces Faces Pain Scale: No hurt  Home Living                                          Prior Functioning/Environment              Frequency  Min 2X/week        Progress Toward Goals  OT Goals(current goals can now be found in the care plan section)  Progress towards OT goals: Progressing toward goals  Acute Rehab OT Goals Patient Stated Goal: husband hoping for pt improvements, be able to go home OT Goal Formulation: With family Time For Goal Achievement: 02/22/24 Potential to Achieve Goals: Good  Plan      Co-evaluation                 AM-PAC OT 6 Clicks Daily Activity     Outcome Measure   Help from another person eating meals?: Total Help from another person taking care of personal grooming?: A Lot Help from another person toileting, which includes using toliet, bedpan, or urinal?: Total Help from another person bathing (including washing, rinsing, drying)?: A Lot Help from another person to put on and taking off regular upper body clothing?: A Lot Help from another person to put on and taking off regular lower body clothing?: Total 6 Click Score: 9    End of Session    OT Visit Diagnosis: Unsteadiness on feet (R26.81);Other abnormalities of gait and mobility (R26.89);Muscle weakness (generalized) (M62.81);Hemiplegia and hemiparesis Hemiplegia - Right/Left: Right Hemiplegia - dominant/non-dominant: Dominant Hemiplegia - caused by: Cerebral infarction   Activity Tolerance  Patient tolerated treatment well   Patient Left in chair;with call bell/phone within reach;with chair alarm set;with family/visitor present   Nurse Communication Need for lift equipment (stedy +2)        Time: 1191-4782 OT Time Calculation (min): 34 min  Charges: OT General Charges $OT Visit: 1 Visit OT Treatments $Therapeutic Activity: 8-22 mins $Neuromuscular Re-education: 8-22 mins  02/09/2024  AB, OTR/L  Acute Rehabilitation Services  Office: [phone]   Jorene New 02/09/2024, 5:41 PM

## 2024-02-09 NOTE — Discharge Instructions (Addendum)
Information on my medicine - ELIQUIS? (apixaban) ? ?This medication education was reviewed with me or my healthcare representative as part of my discharge preparation.  ? ?Why was Eliquis? prescribed for you? ?Eliquis? was prescribed for you to reduce the risk of forming blood clots that can cause a stroke if you have a medical condition called atrial fibrillation (a type of irregular heartbeat) OR to reduce the risk of a blood clots forming after orthopedic surgery. ? ?What do You need to know about Eliquis? ? ?Take your Eliquis? TWICE DAILY - one tablet in the morning and one tablet in the evening with or without food.  It would be best to take the doses about the same time each day. ? ?If you have difficulty swallowing the tablet whole please discuss with your pharmacist how to take the medication safely. ? ?Take Eliquis? exactly as prescribed by your doctor and DO NOT stop taking Eliquis? without talking to the doctor who prescribed the medication.  Stopping may increase your risk of developing a new clot or stroke.  Refill your prescription before you run out. ? ?After discharge, you should have regular check-up appointments with your healthcare provider that is prescribing your Eliquis?.  In the future your dose may need to be changed if your kidney function or weight changes by a significant amount or as you get older. ? ?What do you do if you miss a dose? ?If you miss a dose, take it as soon as you remember on the same day and resume taking twice daily.  Do not take more than one dose of ELIQUIS at the same time. ? ?Important Safety Information ?A possible side effect of Eliquis? is bleeding. You should call your healthcare provider right away if you experience any of the following: ?Bleeding from an injury or your nose that does not stop. ?Unusual colored urine (red or dark brown) or unusual colored stools (red or black). ?Unusual bruising for unknown reasons. ?A serious fall or if you hit your head (even  if there is no bleeding). ? ?Some medicines may interact with Eliquis? and might increase your risk of bleeding or clotting while on Eliquis?. To help avoid this, consult your healthcare provider or pharmacist prior to using any new prescription or non-prescription medications, including herbals, vitamins, non-steroidal anti-inflammatory drugs (NSAIDs) and supplements. ? ?This website has more information on Eliquis? (apixaban): http://www.eliquis.com/eliquis/home ?  ?

## 2024-02-09 NOTE — Telephone Encounter (Signed)
 Patient Product/process development scientist completed.    The patient is insured through [employer]. Patient has Medicare and is not eligible for a copay card, but may be able to apply for patient assistance or Medicare RX Payment Plan (Patient Must reach out to their plan, if eligible for payment plan), if available.    Ran test claim for Eliquis 5 mg and the current 30 day co-pay is $152.71.  Ran test claim for Xarelto 20 mg and the current 30 day co-pay is $150.63.  This test claim was processed through [facility]- copay amounts may vary at other pharmacies due to pharmacy/plan contracts, or as the patient moves through the different stages of their insurance plan.     Catherine Green, CPHT Pharmacy Technician III Certified Patient Advocate [facility] Patient Advocate Team Direct Number: [phone]  Fax: [phone]

## 2024-02-09 NOTE — Evaluation (Signed)
 Speech Language Pathology Evaluation Patient Details Name: Catherine Green MRN: [id number] DOB: 12-30-1940 Today's Date: 02/09/2024 Time: 0865-7846 SLP Time Calculation (min) (ACUTE ONLY): 40 min  Problem List:  Patient Active Problem List   Diagnosis Date Noted   Acute CVA (cerebrovascular accident) (HCC) 02/07/2024   Atrial fibrillation (HCC) 11/11/2023   Chronic obstructive pulmonary disease with acute exacerbation (HCC) 11/10/2023   Hair loss 09/22/2023   Cervical spondylosis with myelopathy and radiculopathy 01/11/2023   Muscle weakness (generalized) 07/17/2022   Mucous cyst of finger 05/17/2022   Acute recurrent frontal sinusitis 04/21/2021   Impacted cerumen of right ear 04/21/2021   History of diabetes mellitus 02/09/2021   Scalp irritation 11/13/2020   Seasonal allergies 07/12/2019   Anemia 02/05/2019   Rash 09/04/2018   GERD (gastroesophageal reflux disease) 09/04/2018   Elevated CK 07/30/2018   Neuropathy 04/24/2018   Health care maintenance 01/19/2018   Medicare annual wellness visit, initial 01/19/2018   Grief 10/07/2017   Sweating abnormality 05/28/2017   Insomnia 05/28/2017   Fatigue 01/15/2017   Dysuria 05/06/2016   Skin lesion 03/16/2016   Aftercare following surgery of the circulatory system 02/18/2016   Spinal stenosis of lumbar region 08/21/2015   Labile blood pressure 05/17/2015   Leg pain, bilateral 02/08/2015   SOB (shortness of breath) 02/08/2015   Cough 01/17/2015   Low back pain 11/05/2014   Benign paroxysmal positional vertigo 09/04/2014   Carotid stenosis 07/03/2014   Preoperative cardiovascular examination 03/07/2014   Esophageal dysphagia 12/25/2013   Chronic diastolic CHF (congestive heart failure) (HCC) 11/23/2013   Palpitations 11/23/2013   Urticaria 04/05/2013   Screening for colon cancer 05/27/2012   Hyperlipidemia 05/04/2012   Dizziness 04/11/2012   Anxiety and depression 03/10/2012   Carotid artery disease (HCC) 12/22/2011    Essential hypertension 10/08/2009   CAD, ARTERY BYPASS GRAFT 10/08/2009   PVD 10/08/2009   SYNCOPE, HX OF 10/08/2009   THROMBOPHLEBITIS, HX OF 10/08/2009   Past Medical History:  Past Medical History:  Diagnosis Date   Acute hypoxic respiratory failure (HCC) 11/09/2023   Acute non-recurrent maxillary sinusitis 10/28/2022   Allergy    hay fever   Anemia    Anxiety    Back pain    Carotid artery occlusion    Cerebrovascular disease    extracranial; occlusive   Chicken pox    Coronary artery disease    Depression    Dizziness    Dizziness    DVT (deep venous thrombosis) (HCC)    Fainting spell    GERD (gastroesophageal reflux disease)    Headache    Heart murmur    Hyperlipidemia    Hypertension    Leg pain    Mitral regurgitation    PONV (postoperative nausea and vomiting)    severe nausea and vomiting   Pre-syncope    PVD (peripheral vascular disease) (HCC)    endarterectomy by Dr. Shirley Douglas   Seasonal allergies    Shortness of breath dyspnea    wth ambulation at times   Swelling of both ankles    and abdomen; takes Lasix  when needed   Thrombophlebitis    following childbirth   Ulcer    Past Surgical History:  Past Surgical History:  Procedure Laterality Date   ANTERIOR CERVICAL DECOMP/DISCECTOMY FUSION N/A 01/11/2023   Procedure: Anterior Cervical Decompression/Discectomy Fusion Cervical Five-Cervical Six - Cervical Six-Cervical Seven;  Surgeon: Agustina Aldrich, MD;  Location: [facility] OR;  Service: Neurosurgery;  Laterality: N/A;  3C   APPENDECTOMY  BREAST SURGERY     saline implants   CARDIAC CATHETERIZATION     CAROTID ENDARTERECTOMY  1992   CAROTID ENDARTERECTOMY Right 02/13/2005   Re-do Right CE   CATARACT EXTRACTION, BILATERAL  2013   CHOLECYSTECTOMY, LAPAROSCOPIC  04/02/2014   Dr. Hortensia Ma   CORONARY ARTERY BYPASS GRAFT  2006   x3 Dr. Matt Song   ENDARTERECTOMY Left 07/20/2014   Procedure: ENDARTERECTOMY CAROTID-LEFT;  Surgeon: Mayo Speck, MD;   Location: [facility] OR;  Service: Vascular;  Laterality: Left;   EYE SURGERY Bilateral 10/2011   Cataract Left eye   SEPTOPLASTY     with bilateral inferior turbinate reductions   SPINE SURGERY  12/2015   TOTAL ABDOMINAL HYSTERECTOMY     UPPER GASTROINTESTINAL ENDOSCOPY     had polyps removed from esophagus   UPPER GI ENDOSCOPY     HPI:  Patient is an 83 year old female admitted to [facility] with acute onset right-sided weakness.  Patient found to have left MCA CVA evolving.  Swallow and speech eval ordered.  Patient resides with her spouse and was using a walker prior to admission due to back issues per staff.  PT has prior h/o dysphagia after ACDF - that recovered.   Assessment / Plan / Recommendation Clinical Impression  Speech-language eval completed with spouse bringing hearing aids for improved communication.  Patient presents with global aphasia with ability to only approximate approximately 25% of phonemes for singing happy birthday with total cues.  She did demonstrate appropriate social response saying okay to MD when asked how she was, but was unable to replicate this.  Patient able to she was choose her name from written choice of 2 with names being written on top of each other.  She required total use to nod her head yes with approximately 25% of accuracy.  Noted communication ability in the room and patient unable to utilize this due to vision deficits but also language deficits.  Spouse present and and encouraged to have patient nod head yes and no to known questions to facilitate her understanding of basic information.  In addition automatic speech task may help patient with verbal expression.  She attempts to communicate but her output is non-comprehensible without appropriate phonemes/words nor comprehension of her deficit.  She will need extensive SLP to maximize her functional rehab.  Spouse informed and agreeable to plan.    SLP Assessment  SLP Recommendation/Assessment:  Patient needs continued Speech Language Pathology Services SLP Visit Diagnosis: Aphasia (R47.01);Dysarthria and anarthria (R47.1)     Assistance Recommended at Discharge  Frequent or constant Supervision/Assistance  Functional Status Assessment Patient has had a recent decline in their functional status and demonstrates the ability to make significant improvements in function in a reasonable and predictable amount of time.  Frequency and Duration min 2x/week  1 week      SLP Evaluation Cognition  Overall Cognitive Status:  (difficult to assess clinically due to her global aphasia) Arousal/Alertness: Awake/alert Memory:  (appears to recognize spouse) Awareness: Impaired Awareness Impairment: Intellectual impairment (poor awareness to language deficits)       Comprehension  Auditory Comprehension Overall Auditory Comprehension: Impaired Yes/No Questions: Impaired Basic Biographical Questions: 0-25% accurate Commands: Impaired One Step Basic Commands: 0-24% accurate Conversation: Simple Other Conversation Comments: basic directions she was able to mimic inconsistetency - -suspect motor planning deficits as well Interfering Components: Visual impairments;Motor planning;Processing speed EffectiveTechniques: Increased volume;Visual/Gestural cues Visual Recognition/Discrimination Discrimination: Exceptions to [facility] Reading Comprehension Reading Status:  (able to  identify her name from choice of 2 - but not her spouse's name)    Expression Expression Primary Mode of Expression: Verbal Verbal Expression Overall Verbal Expression: Impaired Automatic Speech: Social Response Repetition: Impaired Naming: Impairment Responsive: 0-25% accurate Confrontation: Impaired Verbal Errors: Not aware of errors;Neologisms;Jargon Pragmatics: No impairment Interfering Components: Anatomical limitation Effective Techniques: Melodic intonation (singing helpful to elicit approx 25% of phonemes  correctly) Non-Verbal Means of Communication: Other (comment) (with total cues and approx 25% of accuracy, pt nodded head yes/no) Written Expression Dominant Hand: Right Written Expression: Not tested (pt hemiparetic)   Oral / Motor  Oral Motor/Sensory Function Overall Oral Motor/Sensory Function: Moderate impairment Facial ROM: Reduced right Facial Symmetry: Abnormal symmetry right Facial Strength: Reduced right (Did not form seal on suction) Facial Sensation:  (Unable to assess -but patient did wipe toothpaste from her right lip therefore some sensation present) Lingual ROM: Other (Comment) (Difficult to assess due to decreased ability to follow directions.  Upon attempt at protrusion very limited) Lingual Symmetry: Within Functional Limits Lingual Strength: Other (Comment) (Difficult to assess due to patient inability to follow directions but with attempt to protrude very limited) Velum: Other (comment) (Could not view) Mandible: Other (Comment) (Did not test due to patient's decreased understanding of information) Motor Speech Overall Motor Speech: Impaired Respiration: Impaired Phonation: Low vocal intensity Articulation: Impaired Intelligibility: Intelligibility reduced Word: 0-24% accurate Motor Planning:  (suspect impaired - pt not protruding tongue even with total visual/verbal cues) Motor Speech Errors: Cornelius Dill 02/09/2024, 8:28 AM Maudie Sorrow, MS [facility] SLP Acute Rehab Services Office [phone]

## 2024-02-09 NOTE — Progress Notes (Signed)
 ANTICOAGULATION CONSULT NOTE  Pharmacy Consult for Eliquis Indication: atrial fibrillation  Allergies  Allergen Reactions   Lyrica  [Pregabalin ] Shortness Of Breath, Swelling and Other (See Comments)    Chest tightness, also    Hydrocodone  Other (See Comments)    Hallucinations    Ivp Dye [Iodinated Contrast Media] Hives and Itching        Lipitor [Atorvastatin] Nausea And Vomiting   Methylprednisolone  Other (See Comments)    Severe weekness    Oxycodone  Hcl Other (See Comments)    Hallucinations    Penicillin G Nausea And Vomiting   Shellfish Allergy Hives and Itching   Buspar  [Buspirone ] Other (See Comments)    Worsening mood   Clavulanic Acid Other (See Comments)    Reaction not noted   Prednisone  Other (See Comments)    Reaction not noted   Codeine Nausea And Vomiting and Other (See Comments)   Cymbalta  [Duloxetine  Hcl] Nausea Only    GI upset at 30mg    Demerol Nausea And Vomiting   Doxycycline  Nausea Only and Other (See Comments)    Weakness, sick to her stomach   Effexor  [Venlafaxine ] Nausea Only   Hydrochlorothiazide  Other (See Comments)    Not an allergy but urinary frequency and sweating at 25mg    Hydroxyzine  Other (See Comments)    Excessive sweating.    Metoprolol  Other (See Comments)    Slowed body down, per pt    Protonix  [Pantoprazole  Sodium] Nausea Only   Trazodone  And Nefazodone Other (See Comments)    Sedation     Patient Measurements: Height: 5' 2 (157.5 cm) Weight: 62.4 kg (137 lb 9.1 oz) IBW/kg (Calculated) : 50.1  Vital Signs: Temp: 98.7 F (37.1 C) (06/11 1138) Temp Source: Oral (06/11 1138) BP: 168/58 (06/11 1138) Pulse Rate: 66 (06/11 1138)  Labs: Recent Labs    02/07/24 1419 02/07/24 1437 02/07/24 1451 02/08/24 0525  HGB 12.8 13.9 14.6 13.4  HCT 40.8 41.0 43.0 41.9  PLT 281  --   --  305  APTT 24  --   --   --   LABPROT 13.3  --   --   --   INR 1.0  --   --   --   CREATININE 0.93 1.00 0.90 1.10*    Estimated  Creatinine Clearance: 33.6 mL/min (A) (by C-G formula based on SCr of 1.1 mg/dL (H)).  Medical History: Past Medical History:  Diagnosis Date   Acute hypoxic respiratory failure (HCC) 11/09/2023   Acute non-recurrent maxillary sinusitis 10/28/2022   Allergy    hay fever   Anemia    Anxiety    Back pain    Carotid artery occlusion    Cerebrovascular disease    extracranial; occlusive   Chicken pox    Coronary artery disease    Depression    Dizziness    Dizziness    DVT (deep venous thrombosis) (HCC)    Fainting spell    GERD (gastroesophageal reflux disease)    Headache    Heart murmur    Hyperlipidemia    Hypertension    Leg pain    Mitral regurgitation    PONV (postoperative nausea and vomiting)    severe nausea and vomiting   Pre-syncope    PVD (peripheral vascular disease) (HCC)    endarterectomy by Dr. Shirley Douglas   Seasonal allergies    Shortness of breath dyspnea    wth ambulation at times   Swelling of both ankles    and abdomen; takes Lasix  when  needed   Thrombophlebitis    following childbirth   Ulcer     Medications:  See MAR  Assessment: 83 yo female presented as code stroke now with new atrial fibrillation (CHA2DS2-VASc = 8).  Not on anticoagulation prior to admission.  Pharmacy consulted for heparin  dosing.  Plan:  STOP Aspirin  (per cardiology) and SQ heparin  prophylaxis  START Eliquis 5 mg twice daily (weight borderline at 62.4 kg, age >32 yo) Monitor SCr and weights for possible dose adjustment to 2.5 mg BID Eliquis copay is $152.71, Xarelto copay is $150.63 - will educate and discuss cost closer to discharge  Cecillia Cogan, PharmD Clinical Pharmacist 02/09/2024  3:21 PM

## 2024-02-09 NOTE — Progress Notes (Signed)
 Orthopedic Tech Progress Note Patient Details:  Catherine Green [birth year] [id number]  Called in order to [employer] for a [employer]   Patient ID: Catherine Green, female   DOB: Jul 26, 1941, 83 y.o.   MRN: [id number]  Catherine Green 02/09/2024, 6:15 PM

## 2024-02-09 NOTE — Progress Notes (Signed)
 STROKE TEAM PROGRESS NOTE   SUBJECTIVE (INTERVAL HISTORY) Husband and RN are at bedside.  Patient still lethargic, eyes closed but open on voice, still has global aphasia and right upper extremity plegic and lower extremity paresis.  Cardiology consulted for arrhythmia in telemonitoring, concerning for a flutter.  Will start anticoagulation.   OBJECTIVE Temp:  [97.9 F (36.6 C)-98.5 F (36.9 C)] 98.2 F (36.8 C) (06/11 0340) Pulse Rate:  [64-78] 64 (06/11 0900) Cardiac Rhythm: Atrial fibrillation (06/11 0713) Resp:  [15-26] 16 (06/11 0900) BP: (143-153)/(52-63) 149/55 (06/11 0900) SpO2:  [93 %-96 %] 95 % (06/11 0900)  Recent Labs  Lab 02/07/24 1431  GLUCAP 132*   Recent Labs  Lab 02/07/24 1419 02/07/24 1437 02/07/24 1451 02/08/24 0525 02/08/24 0712  NA 137 139 138 141  --   K 4.2 4.2 3.6 3.9  --   CL 104 102 102 102  --   CO2 24  --   --  25  --   GLUCOSE 142* 143* 225* 154*  --   BUN 15 17 16 16   --   CREATININE 0.93 1.00 0.90 1.10*  --   CALCIUM  8.6*  --   --  9.3  --   MG  --   --   --   --  2.5*   Recent Labs  Lab 02/07/24 1419 02/08/24 0525  AST 22 25  ALT 14 15  ALKPHOS 101 110  BILITOT 0.4 0.7  PROT 6.7 7.1  ALBUMIN 4.0 4.0   Recent Labs  Lab 02/07/24 1419 02/07/24 1437 02/07/24 1451 02/08/24 0525  WBC 11.4*  --   --  7.2  NEUTROABS 9.8*  --   --   --   HGB 12.8 13.9 14.6 13.4  HCT 40.8 41.0 43.0 41.9  MCV 86.4  --   --  84.0  PLT 281  --   --  305   No results for input(s): CKTOTAL, CKMB, CKMBINDEX, TROPONINI in the last 168 hours. Recent Labs    02/07/24 1419  LABPROT 13.3  INR 1.0   No results for input(s): COLORURINE, LABSPEC, PHURINE, GLUCOSEU, HGBUR, BILIRUBINUR, KETONESUR, PROTEINUR, UROBILINOGEN, NITRITE, LEUKOCYTESUR in the last 72 hours.  Invalid input(s): APPERANCEUR     Component Value Date/Time   CHOL 283 (H) 02/08/2024 0525   CHOL 110 02/26/2021 1124   TRIG 71 02/08/2024 0525   HDL 70  02/08/2024 0525   HDL 66 02/26/2021 1124   CHOLHDL 4.0 02/08/2024 0525   VLDL 14 02/08/2024 0525   LDLCALC 199 (H) 02/08/2024 0525   LDLCALC 28 02/26/2021 1124   Lab Results  Component Value Date   HGBA1C 5.8 (H) 02/08/2024   No results found for: LABOPIA, COCAINSCRNUR, LABBENZ, AMPHETMU, THCU, LABBARB  Recent Labs  Lab 02/07/24 1419  ETH <15    I have personally reviewed the radiological images below and agree with the radiology interpretations.  VAS US  LOWER EXTREMITY VENOUS (DVT) Result Date: 02/08/2024  Lower Venous DVT Study Patient Name:  Catherine Green  Date of Exam:   02/08/2024 Medical Rec #: [id number]       Accession #:    [id number] Date of Birth: 1941/08/19       Patient Gender: F Patient Age:   83 years Exam Location:  [facility] Procedure:      VAS US  LOWER EXTREMITY VENOUS (DVT) Referring Phys: Fraser Jackson Nitya Cauthon --------------------------------------------------------------------------------  Indications: Stroke.  Risk Factors: None identified. Limitations: Poor ultrasound/tissue interface and patient positioning, patient  immobility. Comparison Study: No prior studies. Performing Technologist: Lerry Ransom RVT  Examination Guidelines: A complete evaluation includes B-mode imaging, spectral Doppler, color Doppler, and power Doppler as needed of all accessible portions of each vessel. Bilateral testing is considered an integral part of a complete examination. Limited examinations for reoccurring indications may be performed as noted. The reflux portion of the exam is performed with the patient in reverse Trendelenburg.  +---------+---------------+---------+-----------+----------+--------------+ RIGHT    CompressibilityPhasicitySpontaneityPropertiesThrombus Aging +---------+---------------+---------+-----------+----------+--------------+ CFV      Full           Yes      Yes                                  +---------+---------------+---------+-----------+----------+--------------+ SFJ      Full                                                        +---------+---------------+---------+-----------+----------+--------------+ FV Prox  Full                                                        +---------+---------------+---------+-----------+----------+--------------+ FV Mid   Full                                                        +---------+---------------+---------+-----------+----------+--------------+ FV Distal               Yes      Yes                                 +---------+---------------+---------+-----------+----------+--------------+ PFV      Full                                                        +---------+---------------+---------+-----------+----------+--------------+ POP      Full           Yes      Yes                                 +---------+---------------+---------+-----------+----------+--------------+ PTV      Full                                                        +---------+---------------+---------+-----------+----------+--------------+ PERO     Full                                                        +---------+---------------+---------+-----------+----------+--------------+   +---------+---------------+---------+-----------+----------+--------------+  LEFT     CompressibilityPhasicitySpontaneityPropertiesThrombus Aging +---------+---------------+---------+-----------+----------+--------------+ CFV      Full           Yes      Yes                                 +---------+---------------+---------+-----------+----------+--------------+ SFJ      Full                                                        +---------+---------------+---------+-----------+----------+--------------+ FV Prox  Full                                                         +---------+---------------+---------+-----------+----------+--------------+ FV Mid   Full                                                        +---------+---------------+---------+-----------+----------+--------------+ FV DistalFull                                                        +---------+---------------+---------+-----------+----------+--------------+ PFV      Full                                                        +---------+---------------+---------+-----------+----------+--------------+ POP      Full           Yes      Yes                                 +---------+---------------+---------+-----------+----------+--------------+ PTV      Full                                                        +---------+---------------+---------+-----------+----------+--------------+ PERO     Full                                                        +---------+---------------+---------+-----------+----------+--------------+     Summary: RIGHT: - There is no evidence of deep vein thrombosis in the lower extremity. However, portions of this examination were limited- see technologist comments above.  - No cystic structure found in the popliteal fossa.  LEFT: - There is no evidence  of deep vein thrombosis in the lower extremity. However, portions of this examination were limited- see technologist comments above.  - No cystic structure found in the popliteal fossa.  *See table(s) above for measurements and observations. Electronically signed by Angela Kell MD on 02/08/2024 at 2:59:07 PM.    Final    CT HEAD POST STROKE FOLLOWUP/TIMED/STAT READ Result Date: 02/08/2024 CLINICAL DATA:  Follow-up examination for neuro deficit, stroke. EXAM: CT HEAD WITHOUT CONTRAST TECHNIQUE: Contiguous axial images were obtained from the base of the skull through the vertex without intravenous contrast. RADIATION DOSE REDUCTION: This exam was performed according to the departmental  dose-optimization program which includes automated exposure control, adjustment of the mA and/or kV according to patient size and/or use of iterative reconstruction technique. COMPARISON:  Comparison made with MRI from 02/07/2024 FINDINGS: Brain: Evolving cytotoxic edema involving the left frontal lobe, consistent with an evolving acute left MCA distribution infarct, stable from prior MRI. No evidence for hemorrhagic transformation. Localized edema with partial effacement of the left lateral ventricle, but no significant midline shift, relatively similar to prior. No other acute intracranial hemorrhage. No other acute large vessel territory infarct. No mass lesion or hydrocephalus. No extra-axial fluid collection. Vascular: Asymmetric hyperdense left MCA, consistent with previously identified occlusion. Skull: Scalp soft tissues and calvarium demonstrate no new finding. Sinuses/Orbits: Globes orbital soft tissues demonstrate no acute finding. Paranasal sinuses and mastoid air cells remain largely clear. Other: None. IMPRESSION: 1. Evolving acute left MCA distribution infarct, not significantly changed from recent MRI. No evidence for hemorrhagic transformation. Localized edema with partial effacement of the left lateral ventricle, but no significant midline shift. 2. No other new acute intracranial abnormality. Electronically Signed   By: Virgia Griffins M.D.   On: 02/08/2024 01:32   MR BRAIN WO CONTRAST Result Date: 02/07/2024 CLINICAL DATA:  Follow-up examination for stroke. EXAM: MRI HEAD WITHOUT CONTRAST TECHNIQUE: Multiplanar, multiecho pulse sequences of the brain and surrounding structures were obtained without intravenous contrast. COMPARISON:  CTs from earlier the same day. FINDINGS: Brain: Cerebral volume within normal limits. Patchy T2/FLAIR hyperintensity involving the periventricular and deep white matter both cerebral hemispheres the consistent with chronic small vessel ischemic disease, mild to  moderate in nature. Confluent restricted diffusion involving the left frontal lobe, consistent with a moderate size evolving left MCA territory infarct. Involvement of the left insula and left basal ganglia noted. No associated hemorrhage or significant regional mass effect. No other evidence for acute or subacute ischemia. No acute intracranial hemorrhage. Few punctate chronic micro hemorrhages noted involving the right cerebellum and right cerebral hemisphere. No mass lesion, midline shift or mass effect. No hydrocephalus or extra-axial fluid collection. Pituitary gland within normal limits. Vascular: Loss of normal flow void within the left ICA and MCA, consistent with occlusion as seen on prior CTA. Major intracranial vascular flow voids are otherwise maintained. Skull and upper cervical spine: Craniocervical junction within normal limits. Bone marrow signal intensity normal. No scalp soft tissue abnormality. Sinuses/Orbits: Prior bilateral ocular lens replacement. Paranasal sinuses are largely clear. No mastoid effusion. Other: None. IMPRESSION: 1. Moderate sized acute left MCA territory infarct. No associated hemorrhage or significant regional mass effect. 2. Loss of normal flow void within the left ICA and MCA, consistent with occlusion as seen on prior CTA. 3. Underlying mild to moderate chronic microvascular ischemic disease. Electronically Signed   By: Virgia Griffins M.D.   On: 02/07/2024 20:30   CT ANGIO HEAD NECK W WO CM W PERF (CODE STROKE) Result  Date: 02/07/2024 CLINICAL DATA:  Neuro deficit, acute, stroke suspected. EXAM: CT ANGIOGRAPHY HEAD AND NECK CT PERFUSION BRAIN TECHNIQUE: Multidetector CT imaging of the head and neck was performed using the standard protocol during bolus administration of intravenous contrast. Multiplanar CT image reconstructions and MIPs were obtained to evaluate the vascular anatomy. Carotid stenosis measurements (when applicable) are obtained utilizing NASCET  criteria, using the distal internal carotid diameter as the denominator. Multiphase CT imaging of the brain was performed following IV bolus contrast injection. Subsequent parametric perfusion maps were calculated using RAPID software. RADIATION DOSE REDUCTION: This exam was performed according to the departmental dose-optimization program which includes automated exposure control, adjustment of the mA and/or kV according to patient size and/or use of iterative reconstruction technique. CONTRAST:  OMNIPAQUE IOHEXOL 350 MG/ML SOLN COMPARISON:  CTA neck 04/22/2020 FINDINGS: CTA NECK FINDINGS Aortic arch: Standard branching with prominent calcified plaque involving the great vessel origins including a proximally 75% stenosis of the origin of the brachiocephalic artery, similar to the prior CTA. 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 Similar appearance of calcified and ulcerated soft plaque in this region. Widely patent ICA. Left carotid system: Patent with 50% stenosis of the common carotid artery origin and less than 50% stenosis of the proximal ICA, similar to the prior CTA. Vertebral arteries: Patent without evidence of a significant stenosis or dissection within limitation of streak artifact which obscures a portion of the right V1 segment. Element left vertebral artery. Skeleton: C5-C7 ACDF. Advanced upper cervical facet arthrosis. C3-4 facet ankylosis. Other neck: No evidence of cervical lymphadenopathy or mass. Upper chest: Partially visualized small to moderate bilateral pleural effusions with associated atelectasis. Status post CABG. Review of the MIP images confirms the above findings CTA HEAD FINDINGS Anterior circulation: The intracranial left ICA is patent proximally, however there is progressively diminished opacification more distally with the vessel  appearing occluded beginning in the cavernous segment through the terminus. The left M1 and proximal left A1 segments are also occluded. There is reconstitution of the left ACA via the anterior communicating artery. There is mild-to-moderate reconstitution of the distal left M1 segment and left MCA branch vessels. The intracranial right ICA is patent with mild nonstenotic atherosclerosis. The right ACA and right MCA are patent without evidence of a significant proximal stenosis. No aneurysm is identified. Posterior circulation: The intracranial vertebral arteries are patent to the basilar with the right being hypoplastic distal to the PICA origin. Patent PICA and SCA origins are visualized bilaterally. The basilar artery is patent and mildly irregular without a significant stenosis. Posterior communicating arteries are diminutive or absent. Both PCAs are patent without evidence of a significant proximal stenosis. No aneurysm is identified. Venous sinuses: As permitted by contrast timing, patent. Anatomic variants: None. Review of the MIP images confirms the above findings CT Brain Perfusion Findings: CT perfusion imaging is felt to be of limited diagnostic utility due to bolus timing, with the arterial upstroke being incompletely captured and without a good venous outflow curve. The perfusion software does not detect the large left MCA infarct which is apparent on the earlier noncontrast head CT and reports 22 mL of mismatch volume. These results were communicated to Dr. Lindzen at 3:21 pm on 02/07/2024 by text page via the [facility] messaging system. IMPRESSION: 1. 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 2. Limited CT  perfusion imaging as described above. 3. 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 4. Unchanged 50% stenosis of the origin of the left common carotid artery and 75% stenosis of the brachiocephalic artery.  5. Partially visualized small to moderate bilateral pleural effusions. 6.  Aortic Atherosclerosis (ICD10-I70.0). Electronically Signed   By: Aundra Lee M.D.   On: 02/07/2024 15:40   CT HEAD CODE STROKE WO CONTRAST Result Date: 02/07/2024 CLINICAL DATA:  Code stroke. Neuro deficit, acute, stroke suspected. Fall this morning. Altered mental status. Aphasia, right-sided deficits, and left-sided facial droop. EXAM: CT HEAD WITHOUT CONTRAST TECHNIQUE: Contiguous axial images were obtained from the base of the skull through the vertex without intravenous contrast. RADIATION DOSE REDUCTION: This exam was performed according to the departmental dose-optimization program which includes automated exposure control, adjustment of the mA and/or kV according to patient size and/or use of iterative reconstruction technique. COMPARISON:  Head MRI 08/03/2018 FINDINGS: Brain: There is evidence of an acute left MCA territory infarct involving portions of the frontal and parietal lobes (most notably at the level of the operculum), insula, caudate, lentiform nucleus, and likely internal capsule. No acute intracranial hemorrhage, significant midline shift, hydrocephalus, or extra-axial fluid collection is identified. Cerebral white matter hypodensities elsewhere are nonspecific but compatible with mild chronic small vessel ischemic disease. Cerebral volume is within normal limits for age. Vascular: Calcified atherosclerosis at the skull base. Hyperdense left M1 segment. Skull: No fracture or suspicious lesion. Sinuses/Orbits: Visualized paranasal sinuses and mastoid air cells are clear. Bilateral cataract extraction. Other: None. ASPECTS [facility] Stroke Program Early CT Score) - Ganglionic level infarction (caudate, lentiform nuclei, internal capsule, insula, M1-M3 cortex): 1 - Supraganglionic infarction (M4-M6 cortex): 2 Total score (0-10 with 10 being normal): 3 These results were communicated to Dr. Lindzen at 2:33 pm on 02/07/2024  by text page via the [facility] messaging system. IMPRESSION: Acute left MCA territory infarct. ASPECTS of 3. Electronically Signed   By: Aundra Lee M.D.   On: 02/07/2024 14:33   MR LUMBAR SPINE WO CONTRAST Result Date: 02/03/2024 CLINICAL DATA:  Chronic lower back pain radiating into the bilateral legs. Evaluate for spinal stenosis. EXAM: MRI LUMBAR SPINE WITHOUT CONTRAST TECHNIQUE: Multiplanar, multisequence MR imaging of the lumbar spine was performed. No intravenous contrast was administered. COMPARISON:  Lumbar spine radiographs, 05/13/2023; MRI lumbar spine 11/12/2020; chest two views 01/22/2020 FINDINGS: Segmentation: Counting down from T1 on prior chest radiographs, there are tiny ribs at the vertebral body considered T12. Distal to this, the next 5 vertebral bodies are considered L1 through L5. On the current MRI axial series 113, image 41 corresponds to the L5-S1 disc space. This is in agreement with a 11/12/2020 lumbar spine MRI report. There is partial sacralization of L5. Alignment:  There is 2 mm retrolisthesis of L4 on L5, unchanged. Vertebrae: Vertebral body heights are maintained. Disc spaces are preserved. Decreased disc hydration is greatest at T10-11 and L2-3 through L4-5. Tiny 9 fat intensity anterior superior T12 vertebral body hemangioma is unchanged (sagittal series 206, image 8). Tiny superior L3 endplate degenerative Schmorl's node is unchanged. Conus medullaris and cauda equina: Conus extends to the mid L1 level. Conus and cauda equina appear normal. Paraspinal and other soft tissues: Partially visualized multiple decreased T1 increased T2 signal right renal cysts are again seen, not significantly changed from prior. No follow-up imaging is recommended. Disc levels: L1-2: No posterior disc bulge, central canal narrowing, or neuroforaminal stenosis. L2-3: Mild bilateral facet joint  hypertrophy. Mild broad-based posterior disc bulge. Mild flattening of the ventral thecal sac, unchanged. No  central canal stenosis. Mild bilateral intraforaminal disc extension. Mild left neuroforaminal stenosis is unchanged to minimally worsened from prior. L3-4: Mild-to-moderate bilateral facet joint and ligamentum flavum hypertrophy. Mild-to-moderate broad-based posterior disc osteophyte complex with bilateral intraforaminal extension. New superimposed left lateral recess posterior disc protrusion measuring up to 4 mm in AP dimension (sagittal series 105, image 10 and axial series 113, image 29) newly minimally posteriorly displaces the descending left L4 nerve within the left lateral recess. New moderate left lateral recess stenosis. Mildly worsened mild-to-moderate left neuroforaminal stenosis. Very mild right neuroforaminal narrowing is similar to prior. No central canal stenosis. L4-5: Postsurgical changes are again seen of right hemilaminotomy. Mild-to-moderate bilateral facet joint hypertrophy. Moderate broad-based posterior disc bulge measure up to 6 mm in AP dimension, similar to prior. Disc minimally impresses on the descending right L5 nerve within the right lateral recess. Mild-to-moderate right and mild left lateral recess stenosis. Overall borderline mild central canal stenosis. Very mild bilateral neuroforaminal narrowing without true stenosis. No significant change. L5-S1: Rudimentary disc. No posterior disc bulge, central canal narrowing, or neuroforaminal stenosis. Left L5-S1 transitional assimilation joint (sagittal image 15). IMPRESSION: Compared to 11/12/2020: 1. Transitional lumbosacral anatomy. Recommend correlation with the numbering nomenclature in this report prior to any image guided invasive procedure. 2. At L3-4, there is a new left lateral recess posterior disc protrusion which minimally posteriorly displaces the descending left L4 nerve within the left lateral recess. New moderate left lateral recess stenosis. Mildly worsened mild-to-moderate left neuroforaminal stenosis. 3. At L4-5, there  are postsurgical changes of right hemilaminotomy. Moderate broad-based posterior disc bulge minimally impresses on the descending right L5 nerve within the right lateral recess. Mild-to-moderate right and mild left lateral recess stenosis. 4. At L2-3, there is mild left neuroforaminal stenosis, unchanged to minimally worsened from prior. Electronically Signed   By: Bertina Broccoli M.D.   On: 02/03/2024 19:12   VAS US  ABI WITH/WO TBI Result Date: 01/13/2024  LOWER EXTREMITY DOPPLER STUDY Patient Name:  Catherine Green  Date of Exam:   01/12/2024 Medical Rec #: [id number]       Accession #:    [id number] Date of Birth: 08/05/1941       Patient Gender: F Patient Age:   35 years Exam Location:  [address] Procedure:      VAS US  ABI WITH/WO TBI Referring Phys: Belva Boyden --------------------------------------------------------------------------------  Indications: Peripheral artery disease. Patient complains of a sharp back pain              that causes her legs to give out and fall to her knees. She reports              this can happen while folding laundry, walking or after a car ride              when she tries to stand afterwards. Patient does not complain of              claudication. High Risk Factors: Hypertension, hyperlipidemia. Other Factors: Hx Spine Surgeries/Spinal Cord Compression.  Comparison Study: 10/28/20 Rt Normal; Lt Normal Performing Technologist: Jenifer Miu RVT  Examination Guidelines: A complete evaluation includes at minimum, Doppler waveform signals and systolic blood pressure reading at the level of bilateral brachial, anterior tibial, and posterior tibial arteries, when vessel segments are accessible. Bilateral testing is considered an integral part of a complete examination. Photoelectric Plethysmograph (PPG) waveforms and  toe systolic pressure readings are included as required and additional duplex testing as needed. Limited examinations for reoccurring indications may be performed as  noted.  ABI Findings: +---------+------------------+-----+-----------+--------+ Right    Rt Pressure (mmHg)IndexWaveform   Comment  +---------+------------------+-----+-----------+--------+ Brachial 187                                        +---------+------------------+-----+-----------+--------+ PTA      198               1.03 multiphasic         +---------+------------------+-----+-----------+--------+ DP       195               1.01 multiphasic         +---------+------------------+-----+-----------+--------+ Great Toe159               0.82 Normal              +---------+------------------+-----+-----------+--------+ +---------+------------------+-----+-----------+-------+ Left     Lt Pressure (mmHg)IndexWaveform   Comment +---------+------------------+-----+-----------+-------+ Brachial 193                                       +---------+------------------+-----+-----------+-------+ PTA      195               1.01 multiphasic        +---------+------------------+-----+-----------+-------+ DP       197               1.02 multiphasic        +---------+------------------+-----+-----------+-------+ Great Toe139               0.72 Normal             +---------+------------------+-----+-----------+-------+ +-------+-----------+-----------+------------+------------+ ABI/TBIToday's ABIToday's TBIPrevious ABIPrevious TBI +-------+-----------+-----------+------------+------------+ Right  1.03       0.82       1.13        0.87         +-------+-----------+-----------+------------+------------+ Left   1.02       0.72       1.12        0.83         +-------+-----------+-----------+------------+------------+  Summary: Right: Resting right ankle-brachial index is within normal range. The right toe-brachial index is normal. Left: Resting left ankle-brachial index is within normal range. The left toe-brachial index is normal. Bilateral ABIs and TBIs  appear essentially unchanged compared to prior study on 10/28/20.  *See table(s) above for measurements and observations.  Electronically signed by Armida Lander MD on 01/13/2024 at 11:59:22 AM.    Final      PHYSICAL EXAM  Temp:  [97.9 F (36.6 C)-98.5 F (36.9 C)] 98.2 F (36.8 C) (06/11 0340) Pulse Rate:  [64-78] 64 (06/11 0900) Resp:  [15-26] 16 (06/11 0900) BP: (143-153)/(52-63) 149/55 (06/11 0900) SpO2:  [93 %-96 %] 95 % (06/11 0900)  General - Well nourished, well developed, in no apparent distress.  Ophthalmologic - fundi not visualized due to noncooperation.  Cardiovascular - Regular rhythm and rate.  Neuro - lethargic and drowsy but able to open eyes on voice, still has global aphasia, not able to follow commands but able to pantomime some commands. No language output only mumbles intermittently without meaning. Left gaze preference but barely cross midline, tracking on the left, not consistent  with visual threat bilaterally but seems to have right neglect. R facial droop. Tongue protrusion not cooperative. LUE no drift, and RUE flaccid. BLE against gravity but right weaker than left. Sensation, coordination not cooperative and gait not tested.   ASSESSMENT/PLAN Ms. NINOSHKA WAINWRIGHT is a 83 y.o. female with history of CAD s/p CABG in 2004, DVT, HTN, HLD, PVD, R CEA x 2 admitted for aphasia and right sided weakness. No TNK given due to outside window.    Stroke:  left MCA large infarct embolic likely secondary to large vessel disease, can not rule out cardioembolic source CT L MCA infarct with left MCA hyperdense sign CTA head and neck left ICA occluded at siphon, as well as left MCA and ACA occlusion.  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 Afib No clear hx of afib However, last admission in 10/2023, EKG on 3/11 read afib but that was not confirmed by cardiology.  11/2023 follow up with Dr. Gollan cardiology no mentioning of afib Cardiology on board Telemetry monitoring showed A flutter Now on eliquis  CAD s/p CABG Hx of CABG in 2004 Followed with Dr. Jerelene Monday cardiology Echo pending On ASA now  Hypertension Stable Avoid low BP given the left ICA occlusion BP goal < 180/105 Long term BP goal normotensive  Hyperlipidemia Home meds:  crestor  20 and zetia  10  LDL 199, goal < 70 Now on crestor  40 and zetia  10 Continue statin and zetia  at discharge  Dysphagia  Speech on board On dysphagia 1 nectar thick liquid On gentle IVF Encourage p.o. intake  her Stroke Risk Factors Advanced age PVD s/p R CEA x 2 Hx of DVT  Other Active Problems Leukocytosis WBC 11.4->7.2  Hospital day # 2  Neurology will sign off. Please call with questions. Pt will follow up with Dr. Genita Keys at [facility] in about 4 weeks. Thanks for the consult.    Consuelo Denmark, MD PhD Stroke Neurology 02/09/2024 11:27 AM    To contact Stroke Continuity provider, please refer to [email or web address]. After hours, contact General Neurology

## 2024-02-09 NOTE — Progress Notes (Signed)
 Physical Therapy Treatment Patient Details Name: Catherine Green MRN: [id number] DOB: [birth year] Today's Date: 02/09/2024   History of Present Illness Patient is a 83 year old female with right side weakness, expressive aphasia, found to have large MCA stroke. History of HTN, GERD, CAD s/p CABG, A-fib, anemia, CHF, anxiety, depression, neuropathy, COPD, spinal stenosis s/p back surgery.    PT Comments  Pt making slow, steady progress with mobility. Able to transfer to chair with Stedy. Also stood with hand held assist but unable to take any steps. Husband present and very supportive. Patient will benefit from intensive inpatient follow-up therapy, >3 hours/day.    If plan is discharge home, recommend the following: A lot of help with walking and/or transfers;A lot of help with bathing/dressing/bathroom;Assistance with cooking/housework;Assist for transportation;Supervision due to cognitive status;Help with stairs or ramp for entrance   Can travel by private vehicle        Equipment Recommendations  Wheelchair (measurements PT);Hospital bed;BSC/3in1;Wheelchair cushion (measurements PT)    Recommendations for Other Services Rehab consult     Precautions / Restrictions Precautions Precautions: Fall Restrictions Weight Bearing Restrictions Per Provider Order: No     Mobility  Bed Mobility Overal bed mobility: Needs Assistance Bed Mobility: Supine to Sit     Supine to sit: Mod assist, +2 for physical assistance     General bed mobility comments: Assist to bring legs off of bed, elevate trunk into sitting and bring hips to EOB    Transfers Overall transfer level: Needs assistance Equipment used: 2 person hand held assist, Ambulation equipment used Transfers: Sit to/from Stand, Bed to chair/wheelchair/BSC Sit to Stand: Mod assist, +2 physical assistance, Min assist, From elevated surface           General transfer comment: Sit to stand with Stedy from bed with +2 mod assist  to power up and stabilize. Assist to place rt hand on Stedy. Multimodal cues to extend neck, trunk and hips. Bed to chair with Stedy. Stood from chair with +2 hand held assist with heavier +2 assist to rise Transfer via Lift Equipment: Stedy  Ambulation/Gait               General Gait Details: unable to at this time   Comptroller Bed    Modified Rankin (Stroke Patients Only)       Balance Overall balance assessment: Needs assistance Sitting-balance support: Feet supported, Bilateral upper extremity supported Sitting balance-Leahy Scale: Poor Sitting balance - Comments: UE support and CGA/min assist for static sitting   Standing balance support: Bilateral upper extremity supported Standing balance-Leahy Scale: Poor Standing balance comment: Stedy or 2 person hand held assist for static standing                            Communication Communication Communication: Impaired Factors Affecting Communication: Difficulty expressing self  Cognition Arousal: Alert, Lethargic Behavior During Therapy: Flat affect   PT - Cognitive impairments: Difficult to assess Difficult to assess due to: Impaired communication                     PT - Cognition Comments: Sleepy but able to arouse and pt maintained arousal while actively working. Back to sleep immediately at end of session. Following commands: Impaired Following commands impaired: Follows one step commands with increased time    Cueing  Cueing Techniques: Verbal cues, Tactile cues, Visual cues  Exercises      General Comments        Pertinent Vitals/Pain Pain Assessment Pain Assessment: Faces Faces Pain Scale: No hurt    Home Living                          Prior Function            PT Goals (current goals can now be found in the care plan section) Acute Rehab PT Goals Patient Stated Goal: spouse goal is to regain independence to go  home with assistance Progress towards PT goals: Progressing toward goals    Frequency    Min 3X/week      PT Plan      Co-evaluation              AM-PAC PT 6 Clicks Mobility   Outcome Measure  Help needed turning from your back to your side while in a flat bed without using bedrails?: A Lot Help needed moving from lying on your back to sitting on the side of a flat bed without using bedrails?: Total Help needed moving to and from a bed to a chair (including a wheelchair)?: Total Help needed standing up from a chair using your arms (e.g., wheelchair or bedside chair)?: Total Help needed to walk in hospital room?: Total Help needed climbing 3-5 steps with a railing? : Total 6 Click Score: 7    End of Session Equipment Utilized During Treatment: Gait belt Activity Tolerance: Patient limited by fatigue Patient left: with call bell/phone within reach;with family/visitor present;in chair;with chair alarm set Nurse Communication: Mobility status;Need for lift equipment PT Visit Diagnosis: Hemiplegia and hemiparesis Hemiplegia - Right/Left: Right Hemiplegia - dominant/non-dominant: Dominant Hemiplegia - caused by: Cerebral infarction     Time: 1330-1400 PT Time Calculation (min) (ACUTE ONLY): 30 min  Charges:    $Therapeutic Activity: 23-37 mins PT General Charges $$ ACUTE PT VISIT: 1 Visit                     [facility] PT Acute Rehabilitation Services Office [phone]    Pura Browns [facility] 02/09/2024, 3:13 PM

## 2024-02-09 NOTE — Progress Notes (Signed)
 PT Cancellation Note  Patient Details Name: Catherine Green MRN: [id number] DOB: [birth year]   Cancelled Treatment:    Reason Eval/Treat Not Completed: Patient at procedure or test/unavailable;Fatigue limiting ability to participate. Pt with MBS and echo earlier on attempts and now fatigued. Will try again later today.   Pura Browns [facility] 02/09/2024, 10:34 AM Angelina Kempf PT Acute Rehabilitation Services Office [phone]

## 2024-02-09 NOTE — Progress Notes (Signed)
 Modified Barium Swallow Study  Patient Details  Name: AVIGAIL PILLING MRN: [id number] Date of Birth: 10-Mar-1941  Today's Date: 02/09/2024  Modified Barium Swallow completed.  Full report located under Chart Review in the Imaging Section.  History of Present Illness Patient is an 83 year old female admitted to [facility] with acute onset right-sided weakness.  Patient found to have left MCA CVA evolving.  Swallow and speech eval ordered.  Patient resides with her spouse and was using a walker prior to admission due to back issues per staff.  PT has prior h/o dysphagia after ACDF - that resolved per chart review.  She underwent BSE 02/08/2024 and requires MBS due to CVA, motor planning deficits negating her ability to cough on command and s/s of aspiration.   Clinical Impression Patient presents with sensorimotor oropharyngeal dysphagia resutling in delayed oral transiting with impaired oral control resulting in premature spillage of boluses into pharynx and minimal mastication of solids.  He benefited from puree bolus to aid oral transiting of solids- Pt pharyngeal swallow trigger was at pyriform sinus with liquids and over epiglottis with vallecula with solids/puree.  Larger bolus size results in improved pharyngeal swallow inititaion with liquids but delay continues.  Discoordination of swallow noted with pt propelling bolus from vallecula into oral cavity and reswallowing - This has likely occured at bedside as well.  No aspiration noted across all boluses and flash penetration cleared within the same swallow.  Pharyngeal swallow is strong fortunately.  Cueing pt to swallow appeared helpful to help elicit swallow but likely was just sensorimotor response.   Recommend consider puree/nectar diet and allow thin liquids via Provale cup - providing items that provide better sensory input.  Recommend thin water between meals after mouth care to faciliate hydration.  Will follow up for dysphagia  management.  Please oral suction pt after meals and place boluses on the left side of pt's oral cavity. Factors that may increase risk of adverse event in presence of aspiration Roderick Civatte & Jessy Morocco 2021): Limited mobility;Dependence for feeding and/or oral hygiene;Weak cough;Reduced cognitive function;Frail or deconditioned  Swallow Evaluation Recommendations Recommendations: PO diet PO Diet Recommendation: Dysphagia 1 (Pureed);Thin liquids (Level 0) Liquid Administration via: Cup;Straw;Other (Comment) Medication Administration: Crushed with puree Supervision: Full assist for feeding;Staff to assist with self-feeding;Full supervision/cueing for swallowing strategies Swallowing strategies  : Slow rate;Small bites/sips;Check for pocketing or oral holding;Check for anterior loss Postural changes: Position pt fully upright for meals;Stay upright 30-60 min after meals Oral care recommendations: Oral care before ice chips/water Recommended consults: Other(comment);Consider dietitian consultation Caregiver Recommendations: Have oral suction available     Maudie Sorrow, MS [facility] SLP Acute Rehab Services Office [phone]  Chantal Comment 02/09/2024,9:38 AM

## 2024-02-09 NOTE — Progress Notes (Signed)
 PROGRESS NOTE                                                                                                                                                                                                             Patient Demographics:    Catherine Green, is a 83 y.o. female, DOB - 1941/07/16, BJY:[id number]  Outpatient Primary MD for the patient is Donnie Galea, MD    LOS - 2  Admit date - 02/07/2024    Chief Complaint  Patient presents with   Code Stroke       Brief Narrative (HPI from H&P)       83 y.o. female with medical history significant of hypertension, hyperlipidemia, GERD, carotid artery disease, CAD status post CABG, atrial fibrillation, anemia, chronic diastolic CHF, anxiety, depression, neuropathy, COPD, spinal stenosis presened with right-sided weakness and expressive aphasia evaluation of large left MCA stroke..    Subjective:    Catherine Green today in bed, in no distress, husband at bedside, SLP at bedside as well, plan for MBS today   Assessment  & Plan :    Acute CVA  - Patient presents with right-sided weakness R more than leg, expressive aphasia, right-sided hemineglect.  -CT L MCA infarct with left MCA hyperdense sign -CTA head and neck left ICA occluded at siphon, as well as left MCA and ACA occlusio - Workup significant for large left MCA territory infarct. - 2D echo is pending -Dopplers negative for DVT - Patient on aspirin  300 mg suppository daily. - Remains with significant aphasia, dysphagia, plan for MBS today - No evidence of A-fib per cardiology, but plan for 30 days heart monitor  Dysphagia -For MBS today  Atrial arrhythmias -She had frequent PACs, PVCs, no evidence of paroxysmal A-fib -Neurology input appreciated, she will need 30 days monitor on discharge   Hypertension  -permissive hypertension as above   Hyperlipidemia  - Continue rosuvastatin  and Zetia  as tolerated    Carotid artery disease  CAD, status post CABG   Continue ASA, rosuvastatin , Zetia  as tolerated. Holding antihypertensives as above   ??  HX of atrial tachycardia versus paroxysmal atrial fibrillation.  [place] vas 2 score of greater than 5. - Holding metoprolol  as above, not on chronic anticoagulation.  Mentioned in previous charts, will consult cardiology for their input.  Discharge summary from [facility]  mentions A-fib, however patient and family members not sure, will get cardiology input guidance with correct anticoagulation regimen.   Chronic diastolic CHF  > Last echo was in March with EF 60-65%, indeterminate diastolic function, normal RV function. Holding Lasix  as above   Anxiety - Depression  - Continue sertraline  if able   Neuropathy  - Continue gabapentin  as able   COPD   - Continue home Breo and albuterol           Condition - Extremely Guarded  Family Communication  : Husband in detail on 02/08/2024, 6/11  Code Status :  Full  Consults  :  Neuro, cardiology  PUD Prophylaxis :     Procedures  :     TTE  CTA- 1. 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 2. Limited CT perfusion imaging as described above. 3. 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 4. Unchanged 50% stenosis of the origin of the left common carotid artery and 75% stenosis of the brachiocephalic artery. 5. Partially visualized small to moderate bilateral pleural effusions. 6.  Aortic Atherosclerosis (ICD10-I70.0).   MRI - 1. Moderate sized acute left MCA territory infarct. No associated hemorrhage or significant regional mass effect. 2. Loss of normal flow void within the left ICA and MCA, consistent with occlusion as seen on prior CTA. 3. Underlying mild to moderate chronic microvascular ischemic disease.      Disposition Plan  :    Status is: Inpatient   DVT Prophylaxis  :    heparin  injection 5,000  Units Start: 02/08/24 1400 SCD's Start: 02/07/24 1625    Lab Results  Component Value Date   PLT 305 02/08/2024    Diet :  Diet Order             DIET - DYS 1 Room service appropriate? Yes; Fluid consistency: Nectar Thick  Diet effective now                    Inpatient Medications  Scheduled Meds:  aspirin   300 mg Rectal Daily   Or   aspirin  EC  81 mg Oral Daily   ezetimibe   10 mg Oral Daily   fluticasone  furoate-vilanterol  1 puff Inhalation Daily   gabapentin   400 mg Oral TID   heparin  injection (subcutaneous)  5,000 Units Subcutaneous Q8H   rosuvastatin   40 mg Oral Daily   sertraline   150 mg Oral Daily   Continuous Infusions:  sodium chloride  75 mL/hr at 02/09/24 0225   PRN Meds:.acetaminophen  **OR** acetaminophen  (TYLENOL ) oral liquid 160 mg/5 mL **OR** acetaminophen , albuterol , food thickener, senna-docusate  Antibiotics  :    Anti-infectives (From admission, onward)    None         Objective:   Vitals:   02/08/24 1952 02/08/24 2328 02/09/24 0340 02/09/24 0900  BP: (!) 143/52 (!) 152/60 (!) 146/63 (!) 149/55  Pulse: 71 72 74 64  Resp: 19 17 16 16   Temp: 98.3 F (36.8 C) 98 F (36.7 C) 98.2 F (36.8 C)   TempSrc: Oral Oral Oral   SpO2: 95% 94% 95% 95%  Weight:      Height:        Wt Readings from Last 3 Encounters:  02/07/24 62.4 kg  01/28/24 60.8 kg  12/20/23 60 kg     Intake/Output Summary (Last 24 hours) at 02/09/2024 0949 Last data filed at 02/09/2024 0600 Gross per 24 hour  Intake 825 ml  Output 500 ml  Net 325 ml     Physical Exam   Awake Alert, she is with significant expressive aphasia, right-sided weakness Symmetrical Chest wall movement, Good air movement bilaterally, CTAB RRR,No Gallops,Rubs or new Murmurs, No Parasternal Heave +ve B.Sounds, Abd Soft, No tenderness, No rebound - guarding or rigidity. No Cyanosis, Clubbing or edema, No new Rash or bruise       Data Review:    Recent Labs  Lab 02/07/24 1419  02/07/24 1437 02/07/24 1451 02/08/24 0525  WBC 11.4*  --   --  7.2  HGB 12.8 13.9 14.6 13.4  HCT 40.8 41.0 43.0 41.9  PLT 281  --   --  305  MCV 86.4  --   --  84.0  MCH 27.1  --   --  26.9  MCHC 31.4  --   --  32.0  RDW 13.7  --   --  14.1  LYMPHSABS 0.9  --   --   --   MONOABS 0.6  --   --   --   EOSABS 0.0  --   --   --   BASOSABS 0.0  --   --   --     Recent Labs  Lab 02/07/24 1419 02/07/24 1437 02/07/24 1451 02/07/24 1452 02/08/24 0525 02/08/24 0712  NA 137 139 138  --  141  --   K 4.2 4.2 3.6  --  3.9  --   CL 104 102 102  --  102  --   CO2 24  --   --   --  25  --   ANIONGAP 9  --   --   --  14  --   GLUCOSE 142* 143* 225*  --  154*  --   BUN 15 17 16   --  16  --   CREATININE 0.93 1.00 0.90  --  1.10*  --   AST 22  --   --   --  25  --   ALT 14  --   --   --  15  --   ALKPHOS 101  --   --   --  110  --   BILITOT 0.4  --   --   --  0.7  --   ALBUMIN 4.0  --   --   --  4.0  --   LATICACIDVEN  --   --   --  1.6  --   --   INR 1.0  --   --   --   --   --   HGBA1C  --   --   --   --  5.8*  --   MG  --   --   --   --   --  2.5*  CALCIUM  8.6*  --   --   --  9.3  --       Recent Labs  Lab 02/07/24 1419 02/07/24 1452 02/08/24 0525 02/08/24 0712  LATICACIDVEN  --  1.6  --   --   INR 1.0  --   --   --   HGBA1C  --   --  5.8*  --   MG  --   --   --  2.5*  CALCIUM  8.6*  --  9.3  --     --------------------------------------------------------------------------------------------------------------- Lab Results  Component Value Date   CHOL 283 (H) 02/08/2024   HDL 70 02/08/2024  LDLCALC 199 (H) 02/08/2024   LDLDIRECT 139.1 04/11/2012   TRIG 71 02/08/2024   CHOLHDL 4.0 02/08/2024    Lab Results  Component Value Date   HGBA1C 5.8 (H) 02/08/2024    Radiology Report VAS US  LOWER EXTREMITY VENOUS (DVT) Result Date: 02/08/2024  Lower Venous DVT Study Patient Name:  ADDY MCMANNIS  Date of Exam:   02/08/2024 Medical Rec #: [id number]       Accession #:     [id number] Date of Birth: October 27, 1940       Patient Gender: F Patient Age:   66 years Exam Location:  [facility] Procedure:      VAS US  LOWER EXTREMITY VENOUS (DVT) Referring Phys: Fraser Jackson XU --------------------------------------------------------------------------------  Indications: Stroke.  Risk Factors: None identified. Limitations: Poor ultrasound/tissue interface and patient positioning, patient immobility. Comparison Study: No prior studies. Performing Technologist: Lerry Ransom RVT  Examination Guidelines: A complete evaluation includes B-mode imaging, spectral Doppler, color Doppler, and power Doppler as needed of all accessible portions of each vessel. Bilateral testing is considered an integral part of a complete examination. Limited examinations for reoccurring indications may be performed as noted. The reflux portion of the exam is performed with the patient in reverse Trendelenburg.  +---------+---------------+---------+-----------+----------+--------------+ RIGHT    CompressibilityPhasicitySpontaneityPropertiesThrombus Aging +---------+---------------+---------+-----------+----------+--------------+ CFV      Full           Yes      Yes                                 +---------+---------------+---------+-----------+----------+--------------+ SFJ      Full                                                        +---------+---------------+---------+-----------+----------+--------------+ FV Prox  Full                                                        +---------+---------------+---------+-----------+----------+--------------+ FV Mid   Full                                                        +---------+---------------+---------+-----------+----------+--------------+ FV Distal               Yes      Yes                                 +---------+---------------+---------+-----------+----------+--------------+ PFV      Full                                                         +---------+---------------+---------+-----------+----------+--------------+ POP      Full           Yes  Yes                                 +---------+---------------+---------+-----------+----------+--------------+ PTV      Full                                                        +---------+---------------+---------+-----------+----------+--------------+ PERO     Full                                                        +---------+---------------+---------+-----------+----------+--------------+   +---------+---------------+---------+-----------+----------+--------------+ LEFT     CompressibilityPhasicitySpontaneityPropertiesThrombus Aging +---------+---------------+---------+-----------+----------+--------------+ CFV      Full           Yes      Yes                                 +---------+---------------+---------+-----------+----------+--------------+ SFJ      Full                                                        +---------+---------------+---------+-----------+----------+--------------+ FV Prox  Full                                                        +---------+---------------+---------+-----------+----------+--------------+ FV Mid   Full                                                        +---------+---------------+---------+-----------+----------+--------------+ FV DistalFull                                                        +---------+---------------+---------+-----------+----------+--------------+ PFV      Full                                                        +---------+---------------+---------+-----------+----------+--------------+ POP      Full           Yes      Yes                                 +---------+---------------+---------+-----------+----------+--------------+ PTV      Full                                                         +---------+---------------+---------+-----------+----------+--------------+  PERO     Full                                                        +---------+---------------+---------+-----------+----------+--------------+     Summary: RIGHT: - There is no evidence of deep vein thrombosis in the lower extremity. However, portions of this examination were limited- see technologist comments above.  - No cystic structure found in the popliteal fossa.  LEFT: - There is no evidence of deep vein thrombosis in the lower extremity. However, portions of this examination were limited- see technologist comments above.  - No cystic structure found in the popliteal fossa.  *See table(s) above for measurements and observations. Electronically signed by Angela Kell MD on 02/08/2024 at 2:59:07 PM.    Final    CT HEAD POST STROKE FOLLOWUP/TIMED/STAT READ Result Date: 02/08/2024 CLINICAL DATA:  Follow-up examination for neuro deficit, stroke. EXAM: CT HEAD WITHOUT CONTRAST TECHNIQUE: Contiguous axial images were obtained from the base of the skull through the vertex without intravenous contrast. RADIATION DOSE REDUCTION: This exam was performed according to the departmental dose-optimization program which includes automated exposure control, adjustment of the mA and/or kV according to patient size and/or use of iterative reconstruction technique. COMPARISON:  Comparison made with MRI from 02/07/2024 FINDINGS: Brain: Evolving cytotoxic edema involving the left frontal lobe, consistent with an evolving acute left MCA distribution infarct, stable from prior MRI. No evidence for hemorrhagic transformation. Localized edema with partial effacement of the left lateral ventricle, but no significant midline shift, relatively similar to prior. No other acute intracranial hemorrhage. No other acute large vessel territory infarct. No mass lesion or hydrocephalus. No extra-axial fluid collection. Vascular: Asymmetric hyperdense left MCA,  consistent with previously identified occlusion. Skull: Scalp soft tissues and calvarium demonstrate no new finding. Sinuses/Orbits: Globes orbital soft tissues demonstrate no acute finding. Paranasal sinuses and mastoid air cells remain largely clear. Other: None. IMPRESSION: 1. Evolving acute left MCA distribution infarct, not significantly changed from recent MRI. No evidence for hemorrhagic transformation. Localized edema with partial effacement of the left lateral ventricle, but no significant midline shift. 2. No other new acute intracranial abnormality. Electronically Signed   By: Virgia Griffins M.D.   On: 02/08/2024 01:32   MR BRAIN WO CONTRAST Result Date: 02/07/2024 CLINICAL DATA:  Follow-up examination for stroke. EXAM: MRI HEAD WITHOUT CONTRAST TECHNIQUE: Multiplanar, multiecho pulse sequences of the brain and surrounding structures were obtained without intravenous contrast. COMPARISON:  CTs from earlier the same day. FINDINGS: Brain: Cerebral volume within normal limits. Patchy T2/FLAIR hyperintensity involving the periventricular and deep white matter both cerebral hemispheres the consistent with chronic small vessel ischemic disease, mild to moderate in nature. Confluent restricted diffusion involving the left frontal lobe, consistent with a moderate size evolving left MCA territory infarct. Involvement of the left insula and left basal ganglia noted. No associated hemorrhage or significant regional mass effect. No other evidence for acute or subacute ischemia. No acute intracranial hemorrhage. Few punctate chronic micro hemorrhages noted involving the right cerebellum and right cerebral hemisphere. No mass lesion, midline shift or mass effect. No hydrocephalus or extra-axial fluid collection. Pituitary gland within normal limits. Vascular: Loss of normal flow void within the left ICA and MCA, consistent with occlusion as seen on prior CTA. Major intracranial vascular flow voids are otherwise  maintained. Skull  and upper cervical spine: Craniocervical junction within normal limits. Bone marrow signal intensity normal. No scalp soft tissue abnormality. Sinuses/Orbits: Prior bilateral ocular lens replacement. Paranasal sinuses are largely clear. No mastoid effusion. Other: None. IMPRESSION: 1. Moderate sized acute left MCA territory infarct. No associated hemorrhage or significant regional mass effect. 2. Loss of normal flow void within the left ICA and MCA, consistent with occlusion as seen on prior CTA. 3. Underlying mild to moderate chronic microvascular ischemic disease. Electronically Signed   By: Virgia Griffins M.D.   On: 02/07/2024 20:30   CT ANGIO HEAD NECK W WO CM W PERF (CODE STROKE) Result Date: 02/07/2024 CLINICAL DATA:  Neuro deficit, acute, stroke suspected. EXAM: CT ANGIOGRAPHY HEAD AND NECK CT PERFUSION BRAIN TECHNIQUE: Multidetector CT imaging of the head and neck was performed using the standard protocol during bolus administration of intravenous contrast. Multiplanar CT image reconstructions and MIPs were obtained to evaluate the vascular anatomy. Carotid stenosis measurements (when applicable) are obtained utilizing NASCET criteria, using the distal internal carotid diameter as the denominator. Multiphase CT imaging of the brain was performed following IV bolus contrast injection. Subsequent parametric perfusion maps were calculated using RAPID software. RADIATION DOSE REDUCTION: This exam was performed according to the departmental dose-optimization program which includes automated exposure control, adjustment of the mA and/or kV according to patient size and/or use of iterative reconstruction technique. CONTRAST:  OMNIPAQUE IOHEXOL 350 MG/ML SOLN COMPARISON:  CTA neck 04/22/2020 FINDINGS: CTA NECK FINDINGS Aortic arch: Standard branching with prominent calcified plaque involving the great vessel origins including a proximally 75% stenosis of the origin of the  brachiocephalic artery, similar to the prior CTA. 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 Similar appearance of calcified and ulcerated soft plaque in this region. Widely patent ICA. Left carotid system: Patent with 50% stenosis of the common carotid artery origin and less than 50% stenosis of the proximal ICA, similar to the prior CTA. Vertebral arteries: Patent without evidence of a significant stenosis or dissection within limitation of streak artifact which obscures a portion of the right V1 segment. Element left vertebral artery. Skeleton: C5-C7 ACDF. Advanced upper cervical facet arthrosis. C3-4 facet ankylosis. Other neck: No evidence of cervical lymphadenopathy or mass. Upper chest: Partially visualized small to moderate bilateral pleural effusions with associated atelectasis. Status post CABG. Review of the MIP images confirms the above findings CTA HEAD FINDINGS Anterior circulation: The intracranial left ICA is patent proximally, however there is progressively diminished opacification more distally with the vessel appearing occluded beginning in the cavernous segment through the terminus. The left M1 and proximal left A1 segments are also occluded. There is reconstitution of the left ACA via the anterior communicating artery. There is mild-to-moderate reconstitution of the distal left M1 segment and left MCA branch vessels. The intracranial right ICA is patent with mild nonstenotic atherosclerosis. The right ACA and right MCA are patent without evidence of a significant proximal stenosis. No aneurysm is identified. Posterior circulation: The intracranial vertebral arteries are patent to the basilar with the right being hypoplastic distal to the PICA origin. Patent PICA and SCA origins are visualized bilaterally. The basilar artery is patent and mildly  irregular without a significant stenosis. Posterior communicating arteries are diminutive or absent. Both PCAs are patent without evidence of a significant proximal stenosis. No aneurysm is identified. Venous sinuses: As permitted by contrast timing, patent.  Anatomic variants: None. Review of the MIP images confirms the above findings CT Brain Perfusion Findings: CT perfusion imaging is felt to be of limited diagnostic utility due to bolus timing, with the arterial upstroke being incompletely captured and without a good venous outflow curve. The perfusion software does not detect the large left MCA infarct which is apparent on the earlier noncontrast head CT and reports 22 mL of mismatch volume. These results were communicated to Dr. Lindzen at 3:21 pm on 02/07/2024 by text page via the [facility] messaging system. IMPRESSION: 1. 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 2. Limited CT perfusion imaging as described above. 3. 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 4. Unchanged 50% stenosis of the origin of the left common carotid artery and 75% stenosis of the brachiocephalic artery. 5. Partially visualized small to moderate bilateral pleural effusions. 6.  Aortic Atherosclerosis (ICD10-I70.0). Electronically Signed   By: Aundra Lee M.D.   On: 02/07/2024 15:40   CT HEAD CODE STROKE WO CONTRAST Result Date: 02/07/2024 CLINICAL DATA:  Code stroke. Neuro deficit, acute, stroke suspected. Fall this morning. Altered mental status. Aphasia, right-sided deficits, and left-sided facial droop. EXAM: CT HEAD WITHOUT CONTRAST TECHNIQUE: Contiguous axial images were obtained from the base of the skull through the vertex without intravenous contrast. RADIATION DOSE REDUCTION: This exam was performed according to the departmental dose-optimization program which includes automated exposure control, adjustment of the mA and/or kV  according to patient size and/or use of iterative reconstruction technique. COMPARISON:  Head MRI 08/03/2018 FINDINGS: Brain: There is evidence of an acute left MCA territory infarct involving portions of the frontal and parietal lobes (most notably at the level of the operculum), insula, caudate, lentiform nucleus, and likely internal capsule. No acute intracranial hemorrhage, significant midline shift, hydrocephalus, or extra-axial fluid collection is identified. Cerebral white matter hypodensities elsewhere are nonspecific but compatible with mild chronic small vessel ischemic disease. Cerebral volume is within normal limits for age. Vascular: Calcified atherosclerosis at the skull base. Hyperdense left M1 segment. Skull: No fracture or suspicious lesion. Sinuses/Orbits: Visualized paranasal sinuses and mastoid air cells are clear. Bilateral cataract extraction. Other: None. ASPECTS [facility] Stroke Program Early CT Score) - Ganglionic level infarction (caudate, lentiform nuclei, internal capsule, insula, M1-M3 cortex): 1 - Supraganglionic infarction (M4-M6 cortex): 2 Total score (0-10 with 10 being normal): 3 These results were communicated to Dr. Lindzen at 2:33 pm on 02/07/2024 by text page via the [facility] messaging system. IMPRESSION: Acute left MCA territory infarct. ASPECTS of 3. Electronically Signed   By: Aundra Lee M.D.   On: 02/07/2024 14:33     Signature  -   Seena Dadds M.D on 02/09/2024 at 9:49 AM   -  To page go to [email or web address]

## 2024-02-09 NOTE — PMR Pre-admission (Shared)
 PMR Admission Coordinator Pre-Admission Assessment  Patient: Catherine Green is an 83 y.o., female MRN: [id number] DOB: [birth year] Height: 5' 2 (157.5 cm) Weight: 62.4 kg  Insurance Information HMO: yes    PPO:      PCP:      IPA:      80/20:      OTHER:  PRIMARY: Raina Bunting Medicare       Policy#: [id number]       Subscriber:  CM Name: ***      Phone#: ***     Fax#: *** Pre-Cert#: ***      Employer: *** Benefits:  Phone #: ***     Name: *** Venson Ginger. Date: ***     Deduct: ***      Out of Pocket Max: ***      Life Max: *** CIR: ***      SNF: *** Outpatient: ***     Co-Pay: *** Home Health: ***      Co-Pay: *** DME: ***     Co-Pay: *** Providers: *** SECONDARY:       Policy#:      Phone#:   Financial Counselor:       Phone#:   The "Data Collection Information Summary" for patients in Inpatient Rehabilitation Facilities with attached "Privacy Act Statement-Health Care Records" was provided and verbally reviewed with: Patient  Emergency Contact Information Contact Information     Name Relation Home Work [place] [phone]      Other Contacts     Name Relation Home Work Mobile   Parker,Tracy Daughter   [phone]       Current Medical History  Patient Admitting Diagnosis: CVA History of Present Illness: Catherine Green is a 83 y.o. female with medical history significant of hypertension, hyperlipidemia, GERD, carotid artery disease, CAD status post CABG, atrial fibrillation, anemia, chronic diastolic CHF, anxiety, depression, neuropathy, COPD, spinal stenosis who presented as a code stroke to [facility] on 02/07/2024. : Vital signs in the ED notable for blood pressure in the 160s to 170s systolic.  Lab workup included CMP with glucose 142, calcium  8.6.  CBC with leukocytosis to 11.4.  PT, PTT, INR within normal limits.  Lactic acid normal.  COVID screening pending.  Ethanol level negative.  Lipid panel and A1c pending.   CT head showed acute left MCA infarct.  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  Lab workup included CMP with glucose 142, calcium  8.6.  CBC with leukocytosis to 11.4.  PT, PTT, INR within normal limits.  Lactic acid normal.  COVID screening pending.  Ethanol level negative.  Lipid panel and A1c pending.  CT head showed acute left MCA infarct.  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 Pt seen by PT/OT/SLP and they recommended CIR to assist return to PLOF.   Complete NIHSS TOTAL: 16  Patient's medical record from [facility] has been reviewed by the rehabilitation admission coordinator and  physician.  Past Medical History  Past Medical History:  Diagnosis Date   Acute hypoxic respiratory failure (HCC) 11/09/2023   Acute non-recurrent maxillary sinusitis 10/28/2022   Allergy    hay fever   Anemia    Anxiety    Back pain    Carotid artery occlusion    Cerebrovascular disease    extracranial; occlusive   Chicken pox    Coronary artery disease    Depression    Dizziness    Dizziness    DVT (deep venous thrombosis) (HCC)    Fainting spell    GERD (gastroesophageal reflux disease)    Headache    Heart murmur    Hyperlipidemia    Hypertension    Leg pain    Mitral regurgitation    PONV (postoperative nausea and vomiting)    severe nausea and vomiting   Pre-syncope    PVD (peripheral vascular disease) (HCC)     endarterectomy by Dr. Shirley Douglas   Seasonal allergies    Shortness of breath dyspnea    wth ambulation at times   Swelling of both ankles    and abdomen; takes Lasix  when needed   Thrombophlebitis    following childbirth   Ulcer     Has the patient had major surgery during 100 days prior to admission? No  Family History   family history includes Aneurysm in her brother and mother; Birth defects in her brother; Bleeding Disorder in her mother and sister; Cancer in her sister; Cirrhosis in her father; Deep vein thrombosis in her brother; Heart attack in her father; Heart disease in her brother, father, mother, and sister; Hyperlipidemia in her brother, daughter, father, mother, and sister; Hypertension in her brother, daughter, father, mother, sister, and another family member; Peripheral vascular disease in her daughter; Varicose Veins in her mother and sister.  Current Medications  Current Facility-Administered Medications:    0.9 %  sodium chloride  infusion, , Intravenous, Continuous, Elgergawy, Ardia Kraft, MD, Last Rate: 75 mL/hr at 02/09/24 1142, Restarted at 02/09/24 1142   acetaminophen  (TYLENOL ) tablet 650 mg, 650 mg, Oral, Q4H PRN **OR** acetaminophen  (TYLENOL ) 160 MG/5ML solution 650 mg, 650 mg, Per Tube, Q4H PRN **OR** acetaminophen  (TYLENOL ) suppository 650 mg, 650 mg, Rectal, Q4H PRN, Melvin, Alexander B, MD   albuterol  (PROVENTIL ) (2.5 MG/3ML) 0.083% nebulizer solution 2.5 mg, 2.5 mg, Nebulization, Q6H PRN, Melvin, Alexander B, MD   aspirin  suppository 300 mg, 300 mg, Rectal, Daily, 300 mg at 02/09/24 1141 **OR** aspirin  EC tablet 81 mg, 81 mg, Oral, Daily, Consuelo Denmark, MD   ezetimibe  (ZETIA ) tablet 10 mg, 10 mg, Oral, Daily, Melvin, Alexander B, MD, 10 mg at 02/09/24 1140   fluticasone  furoate-vilanterol (BREO ELLIPTA ) 100-25 MCG/ACT 1 puff, 1 puff, Inhalation, Daily, Melvin, Alexander B, MD   food thickener (SIMPLYTHICK (NECTAR/LEVEL 2/MILDLY THICK)) 1 packet, 1 packet, Oral, PRN,  Elgergawy, Dawood S, MD   gabapentin  (NEURONTIN ) capsule 400 mg, 400 mg, Oral, TID, Melvin, Alexander B, MD, 400 mg at 02/09/24 1141   heparin  injection 5,000 Units, 5,000 Units, Subcutaneous, Q8H, Singh, Prashant K, MD, 5,000 Units at 02/09/24 0511   rosuvastatin  (CRESTOR ) tablet 40 mg, 40 mg, Oral, Daily, Consuelo Denmark, MD, 40 mg at 02/09/24 1140   senna-docusate (Senokot-S) tablet 1 tablet, 1 tablet, Oral, QHS PRN, Melvin, Alexander B, MD   sertraline  (ZOLOFT ) tablet 150 mg, 150 mg, Oral, Daily, Melvin, Alexander B, MD, 150 mg at 02/09/24 1140  Patients Current Diet:  Diet Order  DIET - DYS 1 Room service appropriate? Yes; Fluid consistency: Nectar Thick  Diet effective now                   Precautions / Restrictions Precautions Precautions: Fall Restrictions Weight Bearing Restrictions Per Provider Order: No   Has the patient had 2 or more falls or a fall with injury in the past year? Yes  Prior Activity Level Community (5-7x/wk): Pt  acvtive in the community PTA  Prior Functional Level Self Care: Did the patient need help bathing, dressing, using the toilet or eating? Independent  Indoor Mobility: Did the patient need assistance with walking from room to room (with or without device)? Independent  Stairs: Did the patient need assistance with internal or external stairs (with or without device)? Independent  Functional Cognition: Did the patient need help planning regular tasks such as shopping or remembering to take medications? Independent  Patient Information Are you of Hispanic, Latino/a,or Spanish origin?: A. No, not of Hispanic, Latino/a, or Spanish origin What is your race?: A. White (proxy) Do you need or want an interpreter to communicate with a doctor or health care staff?: 0. No  Patient's Response To:  Health Literacy and Transportation Is the patient able to respond to health literacy and transportation needs?: No Health Literacy - How often  do you need to have someone help you when you read instructions, pamphlets, or other written material from your doctor or pharmacy?: Patient unable to respond In the past 12 months, has lack of transportation kept you from medical appointments or from getting medications?: No (proxy) In the past 12 months, has lack of transportation kept you from meetings, work, or from getting things needed for daily living?: No  Home Assistive Devices / Equipment Home Equipment: Agricultural consultant (2 wheels), Rollator (4 wheels), Shower seat, Hand held shower head  Prior Device Use: Indicate devices/aids used by the patient prior to current illness, exacerbation or injury? Walker  Current Functional Level Cognition  Arousal/Alertness: Awake/alert Overall Cognitive Status:  (difficult to assess clinically due to her global aphasia) Orientation Level: Other (comment) (UTA) Memory:  (appears to recognize spouse) Awareness: Impaired Awareness Impairment: Intellectual impairment (poor awareness to language deficits)    Extremity Assessment (includes Sensation/Coordination)  Upper Extremity Assessment: Right hand dominant, RUE deficits/detail RUE Deficits / Details: no active movement noted. hypertonicity in hand and with elbow flexion but PROM WFL. Pt initiating self ROM using L hand w/ Min assist for technique. no responses to tactile stimulation RUE Sensation: decreased light touch, decreased proprioception RUE Coordination: decreased fine motor, decreased gross motor  Lower Extremity Assessment: Defer to PT evaluation RLE Deficits / Details: unable to follow commands for formal testing. can complete partial SLR. hypertonicity with PROM. partial weight bearing with standing without knee buckling RLE Sensation: history of peripheral neuropathy RLE Coordination: decreased fine motor, decreased gross motor LLE Deficits / Details: weight bearing without knee buckling. unable to formall assess due to impaired  cognition    ADLs  Overall ADL's : Needs assistance/impaired Eating/Feeding: NPO Grooming: Maximal assistance, Sitting Upper Body Bathing: Maximal assistance, Sitting Lower Body Bathing: Total assistance, Bed level Upper Body Dressing : Maximal assistance, Sitting Lower Body Dressing: Total assistance, Bed level Toileting- Clothing Manipulation and Hygiene: Total assistance, Bed level    Mobility  Overal bed mobility: Needs Assistance Bed Mobility: Supine to Sit, Sit to Supine Supine to sit: Mod assist, +2 for physical assistance Sit to supine: Mod assist, +2 for physical assistance General  bed mobility comments: multi modal cues required. increased time and effort    Transfers  Overall transfer level: Needs assistance Equipment used: 2 person hand held assist Transfers: Sit to/from Stand Sit to Stand: Mod assist, +2 physical assistance General transfer comment: cues for technique. 2 standing bouts performed    Ambulation / Gait / Stairs / Wheelchair Mobility  Ambulation/Gait General Gait Details: unable to at this time Pre-gait activities: weight shifting faciliation provided for off loading for attempts to side step. unable to clear either foot to take a true step. patient is able to slide left foot minimally to the left to narrow stance    Posture / Balance Dynamic Sitting Balance Sitting balance - Comments: occasional pushing to the right. intermittent external support required Balance Overall balance assessment: Needs assistance Sitting-balance support: Feet supported Sitting balance-Leahy Scale: Poor Sitting balance - Comments: occasional pushing to the right. intermittent external support required Standing balance support: Bilateral upper extremity supported Standing balance-Leahy Scale: Poor Standing balance comment: up to +2 person assistance for safety    Special needs/care consideration Skin *** and Special service needs ***   Previous Home Environment (from  acute therapy documentation) Living Arrangements: Spouse/significant other  Lives With: Spouse Available Help at Discharge: Family Type of Home: House Home Access: Elevator Bathroom Shower/Tub: Heritage manager Toilet: Handicapped height Bathroom Accessibility: Yes How Accessible: Accessible via walker, Accessible via wheelchair Home Care Services: No  Discharge Living Setting Plans for Discharge Living Setting: Patient's home Type of Home at Discharge: House Discharge Home Layout: One level Discharge Home Access: Elevator Discharge Bathroom Shower/Tub: Walk-in shower Discharge Bathroom Toilet: Handicapped height Discharge Bathroom Accessibility: Yes How Accessible: Accessible via wheelchair, Accessible via walker  Social/Family/Support Systems Patient Roles: Spouse Contact Information: [phone] Anticipated Caregiver: 24/7 supervision only Caregiver Availability: 24/7 Discharge Plan Discussed with Primary Caregiver: Yes Is Caregiver In Agreement with Plan?: Yes  Goals Patient/Family Goal for Rehab: PT/OT/SLP Supervision Expected length of stay: 16-18 days Pt/Family Agrees to Admission and willing to participate: Yes Program Orientation Provided & Reviewed with Pt/Caregiver Including Roles  & Responsibilities: Yes  Decrease burden of Care through IP rehab admission: not anticipated  Possible need for SNF placement upon discharge: not anticipated  Patient Condition: I have reviewed medical records from [facility] , spoken with CM, and patient. I met with patient at the bedside for inpatient rehabilitation assessment.  Patient will benefit from ongoing PT and OT, can actively participate in 3 hours of therapy a day 5 days of the week, and can make measurable gains during the admission.  Patient will also benefit from the coordinated team approach during an Inpatient Acute Rehabilitation admission.  The patient will receive intensive therapy as well as  Rehabilitation physician, nursing, social worker, and care management interventions.  Due to safety, skin/wound care, disease management, medication administration, pain management, and patient education the patient requires 24 hour a day rehabilitation nursing.  The patient is currently min A with mobility and basic ADLs.  Discharge setting and therapy post discharge at home with home health is anticipated.  Patient has agreed to participate in the Acute Inpatient Rehabilitation Program and will admit {Time; today/tomorrow:10263}.  Preadmission Screen Completed By:  Dorena Gander, 02/09/2024 1:34 PM ______________________________________________________________________   Discussed status with Dr. Aaron Aas on *** at *** and received approval for admission today.  Admission Coordinator:  Dorena Gander, CCC-SLP, time Aaron AasAlanna Hu ***   Assessment/Plan: Diagnosis: *** Does the need for close, 24 hr/day Medical  supervision in concert with the patient's rehab needs make it unreasonable for this patient to be served in a less intensive setting? {yes_no_potentially:3041433} Co-Morbidities requiring supervision/potential complications: *** Due to {due WG:9562130}, does the patient require 24 hr/day rehab nursing? {yes_no_potentially:3041433} Does the patient require coordinated care of a physician, rehab nurse, PT, OT, and SLP to address physical and functional deficits in the context of the above medical diagnosis(es)? {yes_no_potentially:3041433} Addressing deficits in the following areas: {deficits:3041436} Can the patient actively participate in an intensive therapy program of at least 3 hrs of therapy 5 days a week? {yes_no_potentially:3041433} The potential for patient to make measurable gains while on inpatient rehab is {potential:3041437} Anticipated functional outcomes upon discharge from inpatient rehab: {functional outcomes:304600100} PT, {functional outcomes:304600100} OT, {functional  outcomes:304600100} SLP Estimated rehab length of stay to reach the above functional goals is: *** Anticipated discharge destination: {anticipated dc setting:21604} 10. Overall Rehab/Functional Prognosis: {potential:3041437}   MD Signature: ***

## 2024-02-09 NOTE — Progress Notes (Signed)
*  PRELIMINARY RESULTS* Echocardiogram 2D Echocardiogram has been performed.  Catherine Green 02/09/2024, 10:03 AM

## 2024-02-09 NOTE — Progress Notes (Addendum)
 Speech Language Pathology Treatment: Dysphagia  Patient Details Name: Catherine Green MRN: [id number] DOB: 22-Dec-1940 Today's Date: 02/09/2024 Time: 1005-1030 SLP Time Calculation (min) (ACUTE ONLY): 25 min  Assessment / Plan / Recommendation Clinical Impression  Patient seen for skilled SLP follow-up to educate family about her swallow function via MBS reviewed.  In addition SLP provided prevail cup for trial of thin liquid specifically carbonated to improve sensory input.  Patient was sleepy but did awaken adequately to self-feed.  Unfortunately she demonstrated overt cough past attempt at swallow with a ginger al most likely due to overt aspiration.  Patient did not aspirate on MBS but given her sensorimotor deficit SLP highly suspects that she aspirated with using 10 cc Provell cup.  At this time recommend thin liquids via teaspoon only when fully alert and accepting.  Reviewed fluoroscopy loops with patient's spouse, patient's sister and sister-in-law and provided written precautions for aspiration medication.  Advised patient will be aspiration risk due to her level of dysphagia with her left MCA CVA but hopeful for improvement with spontaneous recovery.  Patient is making good progress in terms of reflexively initiating a swallow and hopeful she will be able to manage nutrition with current diet regimen.  Using teach back and written precautions,  pt's family educated to findings and recommendations.  Spouse reports he will not be feeding patient and advised him that that was appropriate and understood.  Patient unable to adequately understand information due to her global aphasia.  She continues to attempt to speak but output is full of neologisms with decreased phonatory strength.   Educated patient's sister and sister-in-law to plan to help patient with comprehending basic yes/no questions to facilitate patient's immediate needs being met.    HPI HPI: Patient is an 83 year old female  admitted to [facility] with acute onset right-sided weakness.  Patient found to have left MCA CVA evolving.  Swallow and speech eval ordered.  Patient resides with her spouse and was using a walker prior to admission due to back issues per staff.  PT has prior h/o dysphagia after ACDF - that resolved per chart review.  She underwent BSE 02/08/2024 and requires MBS due to CVA, motor planning deficits negating her ability to cough on command and s/s of aspiration.  SLP follow up for dysphagia/dysphasia indicated.      SLP Plan  Continue with current plan of care  Patient needs continued Speech Language Pathology Services       Recommendations  Diet recommendations: Dysphagia 1 (puree);Nectar-thick liquid (tsps of thin) Liquids provided via: Teaspoon;Cup;Straw (thin via tsp only) Medication Administration: Crushed with puree Supervision: Full supervision/cueing for compensatory strategies Compensations: Slow rate;Small sips/bites;Other (Comment) (place boluses on pt's left side of mouth) Postural Changes and/or Swallow Maneuvers: Seated upright 90 degrees;Upright 30-60 min after meal (observe pt swallow before giving more)                      Frequent or constant Supervision/Assistance Dysphagia, oropharyngeal phase (R13.12)     Continue with current plan of care   Maudie Sorrow, MS [facility] SLP Acute Rehab Services Office [phone]   Chantal Comment  02/09/2024, 12:04 PM

## 2024-02-09 NOTE — Progress Notes (Addendum)
 Progress Note  Patient Name: Catherine Green Date of Encounter: 02/09/2024 [facility] Cardiologist: Timothy Gollan, MD   Interval Summary    Patient seen this morning with multiple family members at bedside. Aphasic but alert and nodding to questions.   Vital Signs Vitals:   02/08/24 1952 02/08/24 2328 02/09/24 0340 02/09/24 0900  BP: (!) 143/52 (!) 152/60 (!) 146/63 (!) 149/55  Pulse: 71 72 74 64  Resp: 19 17 16 16   Temp: 98.3 F (36.8 C) 98 F (36.7 C) 98.2 F (36.8 C)   TempSrc: Oral Oral Oral   SpO2: 95% 94% 95% 95%  Weight:      Height:        Intake/Output Summary (Last 24 hours) at 02/09/2024 0939 Last data filed at 02/09/2024 0600 Gross per 24 hour  Intake 825 ml  Output 500 ml  Net 325 ml      02/07/2024    3:34 PM 02/07/2024    2:00 PM 01/28/2024    1:41 PM  Last 3 Weights  Weight (lbs) 137 lb 9.1 oz 137 lb 9.1 oz 134 lb  Weight (kg) 62.4 kg 62.4 kg 60.782 kg      Telemetry/ECG  Telemetry continues to be concerning for possible atrial fibrillation vs frequent PACs. Controlled HR - Personally Reviewed  Physical Exam  GEN: No acute distress.   Neck: No JVD Cardiac: slightly irregular. 3/6 systolic murmur.  Respiratory: Clear to auscultation bilaterally. GI: Soft, nontender, non-distended  MS: No edema  Assessment & Plan   83 year old female with hx 3 vessel CAD s/p CABG (2006), HFpEF, bilateral carotid artery disease s/p bilateral CEA, hypertension, hyperlipidemia, PVCs, atrial tachycardia. We are following for possible afib this admission. No tachycardia/RVR this admission.   Atrial arrhythmia with PACs vs atrial fibrillation  Patient admitted as code stroke found to have large left MCA infarction. No prior history of afib per note review, has not been on OAC. As above, telemetry continues to be concerning for possible afib, though frequent PACs also seen. Ultimately patient has several other stroke risk factors including her carotid  stenosis.  Will curbside EP regarding patient's rhythm. Very low threshold to consider DOAC given CVA. TTE pending  CAD s/p CABG Hyperlipidemia No anginal symptoms this admission. Patient has previously been on PCSK9, unfortunately was not financially feasible to continue.  Continue Zetia  and Crestor  Continue ASA  Hypertension HFpEF Moderately hypertensive this admission. Euvolemic. PTA meds include Amlodipine  1.5mg , Lasix  40mg , Toprol  XL 12.5mg . BP meds have been held so far this admission.  Resume per neurology, permissive hypertension? Echo pending  Carotid artery stenosis Followed by vascular surgery. Per March ultrasound, has 40-59% left stenosis, <39% right.   Per primary team: CVA Dysphagia    For questions or updates, please contact [facility] Please consult [email or web address] for contact info under       Signed, Leala Prince, PA-C   Attending Note:   The patient was seen and examined.  Agree with assessment and plan as noted above.  Changes made to the above note as needed.  Patient seen and independently examined with Leala Prince, PA .   We discussed all aspects of the encounter. I agree with the assessment and plan as stated above.   Recent stroke.  The patient is still aphasic.  We continue to look for any evidence of atrial fibrillation.  Echocardiogram suggest some atrial activity.  I have asked the electrophysiology team to also review EKGs and telemetry strips.  Continue current medications.  If we find any evidence of atrial fibrillation we will start Eliquis in place of ASA .     I have spent a total of 40 minutes with patient reviewing hospital  notes , telemetry, EKGs, labs and examining patient as well as establishing an assessment and plan that was discussed with the patient.  > 50% of time was spent in direct patient care.    [place], Marieta Shorten., MD, [facility] 02/09/2024, 11:53 AM [address],  [address] Office [phone] Pager  [phone]

## 2024-02-10 DIAGNOSIS — I639 Cerebral infarction, unspecified: Secondary | ICD-10-CM | POA: Diagnosis not present

## 2024-02-10 LAB — CBC
HCT: 35.6 % — ABNORMAL LOW (ref 36.0–46.0)
Hemoglobin: 11 g/dL — ABNORMAL LOW (ref 12.0–15.0)
MCH: 26.5 pg (ref 26.0–34.0)
MCHC: 30.9 g/dL (ref 30.0–36.0)
MCV: 85.8 fL (ref 80.0–100.0)
Platelets: 250 10*3/uL (ref 150–400)
RBC: 4.15 MIL/uL (ref 3.87–5.11)
RDW: 13.9 % (ref 11.5–15.5)
WBC: 7.2 10*3/uL (ref 4.0–10.5)
nRBC: 0 % (ref 0.0–0.2)

## 2024-02-10 LAB — BASIC METABOLIC PANEL WITH GFR
Anion gap: 10 (ref 5–15)
BUN: 16 mg/dL (ref 8–23)
CO2: 23 mmol/L (ref 22–32)
Calcium: 8.7 mg/dL — ABNORMAL LOW (ref 8.9–10.3)
Chloride: 111 mmol/L (ref 98–111)
Creatinine, Ser: 0.79 mg/dL (ref 0.44–1.00)
GFR, Estimated: >60 mL/min (ref >60)
Glucose, Bld: 92 mg/dL (ref 70–99)
Potassium: 3.4 mmol/L — ABNORMAL LOW (ref 3.5–5.1)
Sodium: 144 mmol/L (ref 135–145)

## 2024-02-10 MED ORDER — POTASSIUM CHLORIDE CRYS ER 20 MEQ PO TBCR
40.0000 meq | EXTENDED_RELEASE_TABLET | Freq: Once | ORAL | Status: AC
  Administered 2024-02-10: 40 meq via ORAL
  Filled 2024-02-10: qty 2

## 2024-02-10 NOTE — [place] FL2 (Signed)
 [place]  MEDICAID [id number] LEVEL OF CARE FORM     IDENTIFICATION  Patient Name: Catherine Green Birthdate: 09-23-1940 Sex: female Admission Date (Current Location): 02/07/2024  [facility] and [place] Number:  Chiropodist and Address:  [facility], [address] [place], [place] [address]      Provider Number: [id number]  Attending Physician Name and Address:  Elgergawy, Ardia Kraft, MD  Relative Name and Phone Number:       Current Level of Care: Hospital Recommended Level of Care: Skilled Nursing Facility Prior Approval Number:    Date Approved/Denied:   PASRR Number: [id number]  Discharge Plan: SNF    Current Diagnoses: Patient Active Problem List   Diagnosis Date Noted   Acute CVA (cerebrovascular accident) (HCC) 02/07/2024   Atrial fibrillation (HCC) 11/11/2023   Chronic obstructive pulmonary disease with acute exacerbation (HCC) 11/10/2023   Hair loss 09/22/2023   Cervical spondylosis with myelopathy and radiculopathy 01/11/2023   Muscle weakness (generalized) 07/17/2022   Mucous cyst of finger 05/17/2022   Acute recurrent frontal sinusitis 04/21/2021   Impacted cerumen of right ear 04/21/2021   History of diabetes mellitus 02/09/2021   Scalp irritation 11/13/2020   Seasonal allergies 07/12/2019   Anemia 02/05/2019   Rash 09/04/2018   GERD (gastroesophageal reflux disease) 09/04/2018   Elevated CK 07/30/2018   Neuropathy 04/24/2018   Health care maintenance 01/19/2018   Medicare annual wellness visit, initial 01/19/2018   Grief 10/07/2017   Sweating abnormality 05/28/2017   Insomnia 05/28/2017   Fatigue 01/15/2017   Dysuria 05/06/2016   Skin lesion 03/16/2016   Aftercare following surgery of the circulatory system 02/18/2016   Spinal stenosis of lumbar region 08/21/2015   Labile blood pressure 05/17/2015   Leg pain, bilateral 02/08/2015   SOB (shortness of breath) 02/08/2015   Cough 01/17/2015   Low back pain 11/05/2014    Benign paroxysmal positional vertigo 09/04/2014   Carotid stenosis 07/03/2014   Preoperative cardiovascular examination 03/07/2014   Esophageal dysphagia 12/25/2013   Chronic diastolic CHF (congestive heart failure) (HCC) 11/23/2013   Palpitations 11/23/2013   Urticaria 04/05/2013   Screening for colon cancer 05/27/2012   Hyperlipidemia 05/04/2012   Dizziness 04/11/2012   Anxiety and depression 03/10/2012   Carotid artery disease (HCC) 12/22/2011   Essential hypertension 10/08/2009   CAD, ARTERY BYPASS GRAFT 10/08/2009   PVD 10/08/2009   SYNCOPE, HX OF 10/08/2009   THROMBOPHLEBITIS, HX OF 10/08/2009    Orientation RESPIRATION BLADDER Height & Weight      (non verbal)  Normal Incontinent, External catheter Weight: 137 lb 9.1 oz (62.4 kg) Height:  5' 2 (157.5 cm)  BEHAVIORAL SYMPTOMS/MOOD NEUROLOGICAL BOWEL NUTRITION STATUS      Continent Diet (See dc summary)  AMBULATORY STATUS COMMUNICATION OF NEEDS Skin   Extensive Assist Verbally Normal                       Personal Care Assistance Level of Assistance  Bathing, Feeding, Dressing Bathing Assistance: Maximum assistance Feeding assistance: Limited assistance Dressing Assistance: Maximum assistance     Functional Limitations Info  Hearing   Hearing Info: Impaired      SPECIAL CARE FACTORS FREQUENCY  PT (By licensed PT), OT (By licensed OT)     PT Frequency: 5x/week OT Frequency: 5x/week            Contractures Contractures Info: Not present    Additional Factors Info  Code Status, Allergies, Psychotropic Code Status  Info: DNR Allergies Info: Lyrica  (Pregabalin ), Hydrocodone , Ivp Dye (Iodinated Contrast Media), Lipitor (Atorvastatin), Methylprednisolone , Oxycodone  Hcl, Penicillin G, Shellfish Allergy, Buspar  (Buspirone ), Clavulanic Acid, Prednisone , Codeine, Cymbalta  (Duloxetine  Hcl), Demerol, Doxycycline , Effexor  (Venlafaxine ), Hydrochlorothiazide , Hydroxyzine , Metoprolol , Protonix  (Pantoprazole   Sodium), Trazodone  And Nefazodone Psychotropic Info: Zoloft          Current Medications (02/10/2024):  This is the current hospital active medication list Current Facility-Administered Medications  Medication Dose Route Frequency Provider Last Rate Last Admin   acetaminophen  (TYLENOL ) tablet 650 mg  650 mg Oral Q4H PRN Melvin, Alexander B, MD       Or   acetaminophen  (TYLENOL ) 160 MG/5ML solution 650 mg  650 mg Per Tube Q4H PRN Melvin, Alexander B, MD       Or   acetaminophen  (TYLENOL ) suppository 650 mg  650 mg Rectal Q4H PRN Johnetta Nab, MD       albuterol  (PROVENTIL ) (2.5 MG/3ML) 0.083% nebulizer solution 2.5 mg  2.5 mg Nebulization Q6H PRN Johnetta Nab, MD       apixaban Herby Lolling) tablet 5 mg  5 mg Oral BID Elgergawy, Dawood S, MD   5 mg at 02/10/24 1610   ezetimibe  (ZETIA ) tablet 10 mg  10 mg Oral Daily Melvin, Alexander B, MD   10 mg at 02/10/24 9604   fluticasone  furoate-vilanterol (BREO ELLIPTA ) 100-25 MCG/ACT 1 puff  1 puff Inhalation Daily Johnetta Nab, MD       food thickener (SIMPLYTHICK (NECTAR/LEVEL 2/MILDLY THICK)) 1 packet  1 packet Oral PRN Elgergawy, Ardia Kraft, MD       gabapentin  (NEURONTIN ) capsule 400 mg  400 mg Oral TID Melvin, Alexander B, MD   400 mg at 02/10/24 5409   rosuvastatin  (CRESTOR ) tablet 40 mg  40 mg Oral Daily Consuelo Denmark, MD   40 mg at 02/10/24 8119   senna-docusate (Senokot-S) tablet 1 tablet  1 tablet Oral QHS PRN Melvin, Alexander B, MD       sertraline  (ZOLOFT ) tablet 150 mg  150 mg Oral Daily Melvin, Alexander B, MD   150 mg at 02/10/24 1478     Discharge Medications: Please see discharge summary for a list of discharge medications.  Relevant Imaging Results:  Relevant Lab Results:   Additional Information SSn: [address] Khianna Blazina, LCSW

## 2024-02-10 NOTE — Progress Notes (Addendum)
 Occupational Therapy Treatment Patient Details Name: Catherine Green MRN: [id number] DOB: 05-10-1941 Today's Date: 02/10/2024   History of present illness Patient is a 83 year old female with right side weakness, expressive aphasia, found to have large MCA stroke. History of HTN, GERD, CAD s/p CABG, A-fib, anemia, CHF, anxiety, depression, neuropathy, COPD, spinal stenosis s/p back surgery.   OT comments  Pt progressing well with therapy, worked with pt on repetitive exercises for RUE neuromuscular re-education. Pt perform AAROM at the shoulder joint for listed movements but elbow and wrist continue to display no AROM, she was able to grip Ot's hand use of body on arm movement but no able to replicate once in upright position. Her R inattention is slightly improving, makes efforts to reposition RUE with her LUE when prompted using multiple cues. Placed R resting hand splint on pt to protect flaccid distal extremities/joint integrity, educated RN on wear schedule and precautions. OT will reassess splint next session. OT to continue to progress pt as able. DC recs update to skilled rehab <3hrs of therapy given family inability to provide needed level of support post acute.       If plan is discharge home, recommend the following:  A lot of help with walking and/or transfers;Two people to help with walking and/or transfers;A lot of help with bathing/dressing/bathroom;Direct supervision/assist for financial management;Direct supervision/assist for medications management;Supervision due to cognitive status   Equipment Recommendations  BSC/3in1;Wheelchair (measurements OT);Wheelchair cushion (measurements OT);Hospital bed;Tub/shower seat    Recommendations for Other Services      Precautions / Restrictions Precautions Precautions: Fall Recall of Precautions/Restrictions: Impaired Restrictions Weight Bearing Restrictions Per Provider Order: No       Mobility Bed Mobility Overal bed mobility:  Needs Assistance Bed Mobility: Rolling, Sidelying to Sit Rolling: Mod assist, Used rails Sidelying to sit: Mod assist       General bed mobility comments: Pt able to maintain sidelying for pt cleanup, mod A to raise trunk upright.    Transfers Overall transfer level: Needs assistance Equipment used: None Transfers: Sit to/from Stand, Bed to chair/wheelchair/BSC Sit to Stand: Max assist Stand pivot transfers: Max assist         General transfer comment: Max A with R knee block to pivot to recliner. Rocking used to faciliate anterior weight shifts.     Balance Overall balance assessment: Needs assistance Sitting-balance support: Feet supported, Bilateral upper extremity supported Sitting balance-Leahy Scale: Fair Sitting balance - Comments: CGA sitting EOB once positioned with feet square   Standing balance support: Bilateral upper extremity supported Standing balance-Leahy Scale: Poor Standing balance comment: reliant on therapist support.                           ADL either performed or assessed with clinical judgement   ADL Overall ADL's : Needs assistance/impaired Eating/Feeding: Maximal assistance;Sitting;Bed level Eating/Feeding Details (indicate cue type and reason): able to use LUE to performing some feeding             Upper Body Dressing : Sitting;Moderate assistance Upper Body Dressing Details (indicate cue type and reason): Pt initiating doffing gown when therapist pulled out Green shirt and said lets change that Pt instinctively doffed unaffected side first, cues to doff affected side last. Worked with pt on performing repetitive scapular protraction to proper RUE through gown when donning.     Toilet Transfer: Maximal Market researcher Details (indicate cue type and reason): simlulated with recliner Toileting-  Clothing Manipulation and Hygiene: Total assistance;Bed level Toileting - Clothing Manipulation  Details (indicate cue type and reason): for rear pericare, pt maintaining sidelying position            Extremity/Trunk Assessment Upper Extremity Assessment RUE Deficits / Details: hypertonicity with elbow flexion PROM. Pt demonstrating activation of R shoulder elevation and initating some adduction. No firing of the biceps/triceps noted as of 6/12, no AROM of wrist and digits. Min scapular protraction and retraction. When performing body on arm movements pt R hand squeeze therapists. Needs increased cues to perform tasks with R side. RUE Sensation: decreased light touch;decreased proprioception (pt shouted in reaction to noxious stimuli (tape removal)) RUE Coordination: decreased fine motor;decreased gross motor            Vision Ability to See in Adequate Light: 2 Moderately impaired Vision Assessment?: Vision impaired- to be further tested in functional context;Yes Alignment/Gaze Preference: Within Defined Limits Tracking/Visual Pursuits: Able to track stimulus in all quads without difficulty Additional Comments: L gaze preference, tracks to R when prompted. Gaze preference noted to be more midline.   Perception Perception Perception: Impaired Preception Impairment Details: Inattention/Neglect;Spatial orientation Perception-Other Comments: R inattention. Shifts gaze to R with cues   Praxis Praxis Praxis: Impaired Praxis Impairment Details: Ideomotor   Communication Communication Communication: Impaired Factors Affecting Communication: Difficulty expressing self   Cognition Arousal: Alert Behavior During Therapy: Flat affect Cognition: Difficult to assess Difficult to assess due to: Impaired communication (global aphasia)           OT - Cognition Comments: able to follow simple one step commands with repetitive cueing and demonstations.                 Following commands: Impaired Following commands impaired: Follows one step commands with increased time,  Follows one step commands inconsistently      Cueing   Cueing Techniques: Verbal cues, Tactile cues, Visual cues, Gestural cues  Exercises Other Exercises Other Exercises: Towel table slides RUE AAROM from OT. Wiping up cleanser from table Other Exercises: RUE shoulder adduction x8 reps Other Exercises: PNF D1 flexion/ext RUE, no noted firing of muscles Other Exercises: Body on Arm, RUE cross body reach to LLE mid shin x5 reps    Shoulder Instructions       General Comments VSS, applied R resting hand splint. Discussed splint wear with RN and placed sign above pt's bed. Pt given call bell and notified NT of need for Green bed linens following pt cleanup    Pertinent Vitals/ Pain       Pain Assessment Pain Assessment: Faces Faces Pain Scale: No hurt Pain Intervention(s): Monitored during session  Home Living                                          Prior Functioning/Environment              Frequency  Min 2X/week        Progress Toward Goals  OT Goals(current goals can now be found in the care plan section)  Progress towards OT goals: Progressing toward goals  Acute Rehab OT Goals Patient Stated Goal: husband hoping for pt improvements, be able to go home OT Goal Formulation: With family Time For Goal Achievement: 02/22/24 Potential to Achieve Goals: Good  Plan      Co-evaluation  AM-PAC OT 6 Clicks Daily Activity     Outcome Measure   Help from another person eating meals?: A Lot Help from another person taking care of personal grooming?: A Lot Help from another person toileting, which includes using toliet, bedpan, or urinal?: Total Help from another person bathing (including washing, rinsing, drying)?: A Lot Help from another person to put on and taking off regular upper body clothing?: A Lot Help from another person to put on and taking off regular lower body clothing?: Total 6 Click Score: 10    End of  Session Equipment Utilized During Treatment: Gait belt  OT Visit Diagnosis: Unsteadiness on feet (R26.81);Other abnormalities of gait and mobility (R26.89);Muscle weakness (generalized) (M62.81);Hemiplegia and hemiparesis Hemiplegia - Right/Left: Right Hemiplegia - dominant/non-dominant: Dominant Hemiplegia - caused by: Cerebral infarction   Activity Tolerance Patient tolerated treatment well   Patient Left in chair;with call bell/phone within reach;with chair alarm set   Nurse Communication Need for lift equipment;Other (comment) (needs bed linens, purewick was soiled in stool)        Time: 1610-9604 OT Time Calculation (min): 34 min  Charges: OT General Charges $OT Visit: 1 Visit OT Treatments $Therapeutic Activity: 8-22 mins $Neuromuscular Re-education: 8-22 mins  02/10/2024  AB, OTR/L  Acute Rehabilitation Services  Office: [phone]   Catherine Green 02/10/2024, 6:18 PM

## 2024-02-10 NOTE — Progress Notes (Signed)
 PROGRESS NOTE                                                                                                                                                                                                             Patient Demographics:    Catherine Green, is a 83 y.o. female, DOB - 1940-12-13, ZOX:[id number]  Outpatient Primary MD for the patient is Donnie Galea, MD    LOS - 3  Admit date - 02/07/2024    Chief Complaint  Patient presents with   Code Stroke       Brief Narrative (HPI from H&P)       83 y.o. female with medical history significant of hypertension, hyperlipidemia, GERD, carotid artery disease, CAD status post CABG, atrial fibrillation, anemia, chronic diastolic CHF, anxiety, depression, neuropathy, COPD, spinal stenosis presened with right-sided weakness and expressive aphasia evaluation of large left MCA stroke..    Subjective:    Catherine Green today in bed, as discussed with evening RN, had some coughing while taking her evening meds, but this morning she tolerated with no issues.     Assessment  & Plan :    Acute CVA  - Patient presents with right-sided weakness R more than leg, expressive aphasia, right-sided hemineglect.  -CT L MCA infarct with left MCA hyperdense sign -CTA head and neck left ICA occluded at siphon, as well as left MCA and ACA occlusio - Workup significant for large left MCA territory infarct. - 2D echo EF 60 to 65% with grade 3 diastolic dysfunction. -Dopplers negative for DVT - Patient started on aspirin  this admission, and now evidence of a flutter on telemetry monitor so she started on Eliquis as well . - Remains with significant aphasia, dysphagia, went for [facility] 6/11, currently on dysphagia 1 with nectar thick - No evidence of A-fib per cardiology, but plan for 30 days heart monitor  Dysphagia - MBS 6/11, currently on dysphagia 1 with nectar thick, advance as  tolerated  Atrial arrhythmias A flutter,?  A-fib -She had frequent PACs, PVCs, no evidence of paroxysmal A-fib -Neurology input appreciated, she will need 30 days monitor on discharge -She was noted to have a flutter event by cardiology team so started on Eliquis   Hypertension  -permissive hypertension, now goal less than 180/105, avoid low blood pressure given left ICA occlusion, long-term  blood pressure goal is normotension   Hyperlipidemia  - Continue rosuvastatin  and Zetia  as tolerated   Carotid artery disease  CAD, status post CABG   Continue ASA, rosuvastatin , Zetia  as tolerated. Holding antihypertensives as above    Chronic diastolic CHF  > Last echo was in March with EF 60-65%, indeterminate diastolic function, normal RV function. Holding Lasix  as above   Anxiety - Depression  - Continue sertraline  if able   Neuropathy  - Continue gabapentin  as able   COPD   - Continue home Breo and albuterol           Condition - Extremely Guarded  Family Communication  : Husband in detail on 02/08/2024, 6/11, 6/12  Code Status :  Full  Consults  :  Neuro, cardiology  PUD Prophylaxis :     Procedures  :     TTE  CTA- 1. 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 2. Limited CT perfusion imaging as described above. 3. 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 4. Unchanged 50% stenosis of the origin of the left common carotid artery and 75% stenosis of the brachiocephalic artery. 5. Partially visualized small to moderate bilateral pleural effusions. 6.  Aortic Atherosclerosis (ICD10-I70.0).   MRI - 1. Moderate sized acute left MCA territory infarct. No associated hemorrhage or significant regional mass effect. 2. Loss of normal flow void within the left ICA and MCA, consistent with occlusion as seen on prior CTA. 3. Underlying mild to moderate chronic microvascular ischemic disease.       Disposition Plan  :    Status is: Inpatient   DVT Prophylaxis  :    SCD's Start: 02/07/24 1625 apixaban (ELIQUIS) tablet 5 mg    Lab Results  Component Value Date   PLT 250 02/10/2024    Diet :  Diet Order             DIET - DYS 1 Room service appropriate? Yes; Fluid consistency: Nectar Thick  Diet effective now                    Inpatient Medications  Scheduled Meds:  apixaban  5 mg Oral BID   ezetimibe   10 mg Oral Daily   fluticasone  furoate-vilanterol  1 puff Inhalation Daily   gabapentin   400 mg Oral TID   rosuvastatin   40 mg Oral Daily   sertraline   150 mg Oral Daily   Continuous Infusions:  sodium chloride  Stopped (02/10/24 0944)   PRN Meds:.acetaminophen  **OR** acetaminophen  (TYLENOL ) oral liquid 160 mg/5 mL **OR** acetaminophen , albuterol , food thickener, senna-docusate  Antibiotics  :    Anti-infectives (From admission, onward)    None         Objective:   Vitals:   02/09/24 2016 02/09/24 2322 02/10/24 0408 02/10/24 0733  BP: (!) 179/66 (!) 171/53 (!) 180/57 (!) 182/64  Pulse: 68 64 (!) 58 (!) 52  Resp: 17 15 17 15   Temp: 97.8 F (36.6 C) 97.7 F (36.5 C) 97.7 F (36.5 C) 98.6 F (37 C)  TempSrc: Axillary Oral Oral Oral  SpO2: 93% 98% 97% 98%  Weight:      Height:        Wt Readings from Last 3 Encounters:  02/07/24 62.4 kg  01/28/24 60.8 kg  12/20/23 60 kg     Intake/Output Summary (Last 24 hours) at 02/10/2024 1025 Last data filed at 02/10/2024 0408 Gross per 24 hour  Intake --  Output 500 ml  Net -500 ml     Physical Exam   Awake Alert, she is with significant expressive aphasia, right-sided weakness Symmetrical Chest wall movement, Good air movement bilaterally, CTAB RRR,No Gallops,Rubs or new Murmurs, No Parasternal Heave +ve B.Sounds, Abd Soft, No tenderness, No rebound - guarding or rigidity. No Cyanosis, Clubbing or edema, No new Rash or bruise        Data Review:    Recent Labs  Lab  02/07/24 1419 02/07/24 1437 02/07/24 1451 02/08/24 0525 02/10/24 0723  WBC 11.4*  --   --  7.2 7.2  HGB 12.8 13.9 14.6 13.4 11.0*  HCT 40.8 41.0 43.0 41.9 35.6*  PLT 281  --   --  305 250  MCV 86.4  --   --  84.0 85.8  MCH 27.1  --   --  26.9 26.5  MCHC 31.4  --   --  32.0 30.9  RDW 13.7  --   --  14.1 13.9  LYMPHSABS 0.9  --   --   --   --   MONOABS 0.6  --   --   --   --   EOSABS 0.0  --   --   --   --   BASOSABS 0.0  --   --   --   --     Recent Labs  Lab 02/07/24 1419 02/07/24 1437 02/07/24 1451 02/07/24 1452 02/08/24 0525 02/08/24 0712 02/10/24 0723  NA 137 139 138  --  141  --  144  K 4.2 4.2 3.6  --  3.9  --  3.4*  CL 104 102 102  --  102  --  111  CO2 24  --   --   --  25  --  23  ANIONGAP 9  --   --   --  14  --  10  GLUCOSE 142* 143* 225*  --  154*  --  92  BUN 15 17 16   --  16  --  16  CREATININE 0.93 1.00 0.90  --  1.10*  --  0.79  AST 22  --   --   --  25  --   --   ALT 14  --   --   --  15  --   --   ALKPHOS 101  --   --   --  110  --   --   BILITOT 0.4  --   --   --  0.7  --   --   ALBUMIN 4.0  --   --   --  4.0  --   --   LATICACIDVEN  --   --   --  1.6  --   --   --   INR 1.0  --   --   --   --   --   --   HGBA1C  --   --   --   --  5.8*  --   --   MG  --   --   --   --   --  2.5*  --   CALCIUM  8.6*  --   --   --  9.3  --  8.7*      Recent Labs  Lab 02/07/24 1419 02/07/24 1452 02/08/24 0525 02/08/24 0712 02/10/24 0723  LATICACIDVEN  --  1.6  --   --   --   INR 1.0  --   --   --   --  HGBA1C  --   --  5.8*  --   --   MG  --   --   --  2.5*  --   CALCIUM  8.6*  --  9.3  --  8.7*    --------------------------------------------------------------------------------------------------------------- Lab Results  Component Value Date   CHOL 283 (H) 02/08/2024   HDL 70 02/08/2024   LDLCALC 199 (H) 02/08/2024   LDLDIRECT 139.1 04/11/2012   TRIG 71 02/08/2024   CHOLHDL 4.0 02/08/2024    Lab Results  Component Value Date   HGBA1C 5.8 (H)  02/08/2024    Radiology Report ECHOCARDIOGRAM COMPLETE Result Date: 02/09/2024    ECHOCARDIOGRAM REPORT   Patient Name:   Catherine Green Date of Exam: 02/09/2024 Medical Rec #:  [id number]      Height:       62.0 in Accession #:    [id number]     Weight:       137.6 lb Date of Birth:  04/27/1941      BSA:          1.631 m Patient Age:    82 years       BP:           146/60 mmHg Patient Gender: F              HR:           68 bpm. Exam Location:  Inpatient Procedure: 2D Echo, Cardiac Doppler and Color Doppler (Both Spectral and Color            Flow Doppler were utilized during procedure). Indications:    Stroke  History:        Patient has prior history of Echocardiogram examinations, most                 recent 11/10/2023. Risk Factors:Hypertension.  Sonographer:    Jeralene Mom Referring Phys: [id number] TAYLOR A PARCELLS IMPRESSIONS  1. Left ventricular ejection fraction, by estimation, is 60 to 65%. The left ventricle has normal function. The left ventricle has no regional wall motion abnormalities. Left ventricular diastolic parameters are consistent with Grade III diastolic dysfunction (restrictive).  2. Right ventricular systolic function is mildly reduced. The right ventricular size is normal. There is mildly elevated pulmonary artery systolic pressure.  3. Left atrial size was moderately dilated.  4. The mitral valve is normal in structure. Trivial mitral valve regurgitation. No evidence of mitral stenosis.  5. The aortic valve is grossly normal. There is mild calcification of the aortic valve. There is mild thickening of the aortic valve. Aortic valve regurgitation is not visualized. Aortic valve sclerosis/calcification is present, without any evidence of aortic stenosis.  6. The inferior vena cava is normal in size with greater than 50% respiratory variability, suggesting right atrial pressure of 3 mmHg. Comparison(s): No significant change from prior study. Conclusion(s)/Recommendation(s): Otherwise  normal echocardiogram, with minor abnormalities described in the report. No intracardiac source of embolism detected on this transthoracic study. Consider a transesophageal echocardiogram to exclude cardiac source of embolism if clinically indicated. FINDINGS  Left Ventricle: Left ventricular ejection fraction, by estimation, is 60 to 65%. The left ventricle has normal function. The left ventricle has no regional wall motion abnormalities. The left ventricular internal cavity size was normal in size. There is  no left ventricular hypertrophy. Left ventricular diastolic parameters are consistent with Grade III diastolic dysfunction (restrictive). Right Ventricle: The right ventricular size is normal. No increase in right ventricular wall thickness. Right ventricular systolic  function is mildly reduced. There is mildly elevated pulmonary artery systolic pressure. The tricuspid regurgitant velocity  is 3.01 m/s, and with an assumed right atrial pressure of 3 mmHg, the estimated right ventricular systolic pressure is 39.2 mmHg. Left Atrium: Left atrial size was moderately dilated. Right Atrium: Right atrial size was normal in size. Pericardium: There is no evidence of pericardial effusion. Mitral Valve: The mitral valve is normal in structure. Trivial mitral valve regurgitation. No evidence of mitral valve stenosis. Tricuspid Valve: The tricuspid valve is normal in structure. Tricuspid valve regurgitation is trivial. No evidence of tricuspid stenosis. Aortic Valve: The aortic valve is grossly normal. There is mild calcification of the aortic valve. There is mild thickening of the aortic valve. Aortic valve regurgitation is not visualized. Aortic valve sclerosis/calcification is present, without any evidence of aortic stenosis. Aortic valve mean gradient measures 8.0 mmHg. Aortic valve peak gradient measures 13.9 mmHg. Aortic valve area, by VTI measures 1.17 cm. Pulmonic Valve: The pulmonic valve was not well  visualized. Pulmonic valve regurgitation is not visualized. No evidence of pulmonic stenosis. Aorta: The aortic root, ascending aorta, aortic arch and descending aorta are all structurally normal, with no evidence of dilitation or obstruction. Venous: The inferior vena cava is normal in size with greater than 50% respiratory variability, suggesting right atrial pressure of 3 mmHg. IAS/Shunts: The atrial septum is grossly normal.  LEFT VENTRICLE PLAX 2D LVIDd:         3.70 cm   Diastology LVIDs:         2.50 cm   LV e' medial:    5.87 cm/s LV PW:         0.80 cm   LV E/e' medial:  25.2 LV IVS:        0.80 cm   LV e' lateral:   6.20 cm/s LVOT diam:     1.70 cm   LV E/e' lateral: 23.9 LV SV:         52 LV SV Index:   32 LVOT Area:     2.27 cm  RIGHT VENTRICLE RV Basal diam:  3.40 cm RV Mid diam:    3.00 cm RV S prime:     9.14 cm/s TAPSE (M-mode): 1.3 cm LEFT ATRIUM             Index        RIGHT ATRIUM           Index LA diam:        3.60 cm 2.21 cm/m   RA Area:     12.40 cm LA Vol (A2C):   72.2 ml 44.28 ml/m  RA Volume:   25.40 ml  15.58 ml/m LA Vol (A4C):   45.4 ml 27.84 ml/m LA Biplane Vol: 58.1 ml 35.63 ml/m  AORTIC VALVE AV Area (Vmax):    1.28 cm AV Area (Vmean):   1.25 cm AV Area (VTI):     1.17 cm AV Vmax:           186.67 cm/s AV Vmean:          130.667 cm/s AV VTI:            0.445 m AV Peak Grad:      13.9 mmHg AV Mean Grad:      8.0 mmHg LVOT Vmax:         105.00 cm/s LVOT Vmean:        71.700 cm/s LVOT VTI:  0.229 m LVOT/AV VTI ratio: 0.51  AORTA Ao Root diam: 2.30 cm Ao Asc diam:  2.50 cm MITRAL VALVE                TRICUSPID VALVE MV Area (PHT): 4.15 cm     TR Peak grad:   36.2 mmHg MV Decel Time: 183 msec     TR Vmax:        301.00 cm/s MR Peak grad: 91.4 mmHg MR Vmax:      478.00 cm/s   SHUNTS MV E velocity: 148.00 cm/s  Systemic VTI:  0.23 m MV A velocity: 63.60 cm/s   Systemic Diam: 1.70 cm MV E/A ratio:  2.33 Sheryle Donning MD Electronically signed by Sheryle Donning  MD Signature Date/Time: 02/09/2024/12:51:45 PM    Final    DG Swallowing Func-Speech Pathology Result Date: 02/09/2024 Table formatting from the original result was not included. Modified Barium Swallow Study Patient Details Name: FRANCHON KETTERMAN MRN: [id number] Date of Birth: 27-Jan-1941 Today's Date: 02/09/2024 HPI/PMH: HPI: Patient is an 83 year old female admitted to [facility] with acute onset right-sided weakness.  Patient found to have left MCA CVA evolving.  Swallow and speech eval ordered.  Patient resides with her spouse and was using a walker prior to admission due to back issues per staff.  PT has prior h/o dysphagia after ACDF - that resolved per chart review.  She underwent BSE 02/08/2024 and requires MBS due to CVA, motor planning deficits negating her ability to cough on command and s/s of aspiration.  SLP follow up for dysphagia/dysphasia indicated. Clinical Impression: Clinical Impression: Patient presents with sensorimotor oropharyngeal dysphagia resutling in delayed oral transiting with impaired oral control resulting in premature spillage of boluses into pharynx and minimal mastication of solids.  He benefited from puree bolus to aid oral transiting of solids- Pt pharyngeal swallow trigger was at pyriform sinus with liquids and over epiglottis with vallecula with solids/puree.  Larger bolus size results in improved pharyngeal swallow inititaion with liquids but delay continues.  Discoordination of swallow noted with pt propelling bolus from vallecula into oral cavity and reswallowing - This has likely occured at bedside as well.  No aspiration noted across all boluses and flash penetration cleared within the same swallow.  Pharyngeal swallow is strong fortunately.  Cueing pt to swallow appeared helpful to help elicit swallow but likely was just sensorimotor response.   Recommend consider puree/nectar diet and allow thin liquids via Provale cup - providing items that provide better sensory  input.  Recommend thin water between meals after mouth care to faciliate hydration.  Will follow up for dysphagia management.  Please oral suction pt after meals and place boluses on the left side of pt's oral cavity. Factors that may increase risk of adverse event in presence of aspiration Roderick Civatte & Jessy Morocco 2021): Factors that may increase risk of adverse event in presence of aspiration Roderick Civatte & Jessy Morocco 2021): Limited mobility; Dependence for feeding and/or oral hygiene; Weak cough; Reduced cognitive function; Frail or deconditioned Recommendations/Plan: Swallowing Evaluation Recommendations Swallowing Evaluation Recommendations Recommendations: PO diet PO Diet Recommendation: Dysphagia 1 (Pureed); Thin liquids (Level 0) Liquid Administration via: Cup; Straw; Other (Comment) Medication Administration: Crushed with puree Supervision: Full assist for feeding; Staff to assist with self-feeding; Full supervision/cueing for swallowing strategies Swallowing strategies  : Slow rate; Small bites/sips; Check for pocketing or oral holding; Check for anterior loss Postural changes: Position pt fully upright for meals; Stay upright 30-60 min after meals Oral care recommendations:  Oral care before ice chips/water Recommended consults: Other(comment); Consider dietitian consultation Caregiver Recommendations: Have oral suction available Treatment Plan Treatment Plan Treatment recommendations: Therapy as outlined in treatment plan below Follow-up recommendations: Acute inpatient rehab (3 hours/day) Functional status assessment: Patient has had a recent decline in their functional status and demonstrates the ability to make significant improvements in function in a reasonable and predictable amount of time. Treatment frequency: Min 2x/week Treatment duration: 1 week Interventions: Compensatory techniques; Aspiration precaution training; Trials of upgraded texture/liquids; Patient/family education; Diet toleration management by  SLP Recommendations Recommendations for follow up therapy are one component of a multi-disciplinary discharge planning process, led by the attending physician.  Recommendations may be updated based on patient status, additional functional criteria and insurance authorization. Assessment: Orofacial Exam: Orofacial Exam Oral Cavity: Oral Hygiene: WFL Oral Cavity - Dentition: Adequate natural dentition Oral Motor/Sensory Function: Suspected cranial nerve impairment CN V - Trigeminal: Right motor impairment; Right sensory impairment CN VII - Facial: Right motor impairment CN IX - Glossopharyngeal, CN X - Vagus: Right motor impairment CN XII - Hypoglossal: Right motor impairment Anatomy: Anatomy: Presence of cervical hardware (appearance of carotid endarterectomy clips) Boluses Administered: Boluses Administered Boluses Administered: Thin liquids (Level 0); Mildly thick liquids (Level 2, nectar thick); Puree; Solid  Oral Impairment Domain: Oral Impairment Domain Lip Closure: Escape progressing to mid-chin Tongue control during bolus hold: Posterior escape of greater than half of bolus; Posterior escape of less than half of bolus Bolus preparation/mastication: Minimal chewing/mashing with majority of bolus unchewed Bolus transport/lingual motion: Slow tongue motion; Repetitive/disorganized tongue motion Oral residue: Trace residue lining oral structures; Residue collection on oral structures Location of oral residue : Tongue Initiation of pharyngeal swallow : Pyriform sinuses; Posterior laryngeal surface of the epiglottis  Pharyngeal Impairment Domain: Pharyngeal Impairment Domain Soft palate elevation: No bolus between soft palate (SP)/pharyngeal wall (PW) Laryngeal elevation: Partial superior movement of thyroid  cartilage/partial approximation of arytenoids to epiglottic petiole Anterior hyoid excursion: Complete anterior movement Epiglottic movement: Complete inversion; Partial inversion Laryngeal vestibule closure:  Incomplete, narrow column air/contrast in laryngeal vestibule Pharyngeal stripping wave : Present - diminished Pharyngeal contraction (A/P view only): N/A Pharyngoesophageal segment opening: Complete distension and complete duration, no obstruction of flow Tongue base retraction: Trace column of contrast or air between tongue base and PPW (with cracker bolus) Pharyngeal residue: Trace residue within or on pharyngeal structures Location of pharyngeal residue: Valleculae  Esophageal Impairment Domain: Esophageal Impairment Domain Esophageal clearance upright position: Complete clearance, esophageal coating Pill: Pill Consistency administered: -- (DNT due to aspiration risk) Penetration/Aspiration Scale Score: Penetration/Aspiration Scale Score 1.  Material does not enter airway: Puree; Solid 2.  Material enters airway, remains ABOVE vocal cords then ejected out: Thin liquids (Level 0); Mildly thick liquids (Level 2, nectar thick) Compensatory Strategies: Compensatory Strategies Compensatory strategies: Yes Straw: Effective Effective Straw: Thin liquid (Level 0); Mildly thick liquid (Level 2, nectar thick) Other(comment): -- (extra tsp bolus, verbal cueing)   General Information: Caregiver present: No (spouse stayed in the pt's room)  Diet Prior to this Study: NPO   Temperature : Normal   Respiratory Status: WFL   Supplemental O2: None (Room air)   History of Recent Intubation: No  Behavior/Cognition: Alert; Doesn't follow directions Self-Feeding Abilities: Needs set-up for self-feeding Baseline vocal quality/speech: Hypophonia/low volume Volitional Cough: Unable to elicit Volitional Swallow: Unable to elicit No data recorded Goal Planning: Prognosis for improved oropharyngeal function: Fair Barriers to Reach Goals: Severity of deficits; Other (Comment); Language deficits No data recorded  Patient/Family Stated Goal: for pt to get better Consulted and agree with results and recommendations: Pt unable/family or caregiver  not available Pain: Pain Assessment Pain Assessment: Faces Pain Score: 0 Faces Pain Scale: 0 Breathing: 0 Negative Vocalization: 0 Facial Expression: 0 Body Language: 0 Consolability: 0 PAINAD Score: 0 End of Session: Start Time:SLP Start Time (ACUTE ONLY): 1005 Stop Time: SLP Stop Time (ACUTE ONLY): 1030 Time Calculation:SLP Time Calculation (min) (ACUTE ONLY): 25 min Charges: SLP Evaluations $ SLP Speech Visit: 1 Visit SLP Evaluations $BSS Swallow: 1 Procedure $MBS Swallow: 1 Procedure $ SLP EVAL LANGUAGE/SOUND PRODUCTION: 1 Procedure $Swallowing Treatment: 1 Procedure SLP visit diagnosis: SLP Visit Diagnosis: Dysphagia, oropharyngeal phase (R13.12) Past Medical History: Past Medical History: Diagnosis Date  Acute hypoxic respiratory failure (HCC) 11/09/2023  Acute non-recurrent maxillary sinusitis 10/28/2022  Allergy   hay fever  Anemia   Anxiety   Back pain   Carotid artery occlusion   Cerebrovascular disease   extracranial; occlusive  Chicken pox   Coronary artery disease   Depression   Dizziness   Dizziness   DVT (deep venous thrombosis) (HCC)   Fainting spell   GERD (gastroesophageal reflux disease)   Headache   Heart murmur   Hyperlipidemia   Hypertension   Leg pain   Mitral regurgitation   PONV (postoperative nausea and vomiting)   severe nausea and vomiting  Pre-syncope   PVD (peripheral vascular disease) (HCC)   endarterectomy by Dr. Shirley Douglas  Seasonal allergies   Shortness of breath dyspnea   wth ambulation at times  Swelling of both ankles   and abdomen; takes Lasix  when needed  Thrombophlebitis   following childbirth  Ulcer  Past Surgical History: Past Surgical History: Procedure Laterality Date  ANTERIOR CERVICAL DECOMP/DISCECTOMY FUSION N/A 01/11/2023  Procedure: Anterior Cervical Decompression/Discectomy Fusion Cervical Five-Cervical Six - Cervical Six-Cervical Seven;  Surgeon: Agustina Aldrich, MD;  Location: [facility] OR;  Service: Neurosurgery;  Laterality: N/A;  3C  APPENDECTOMY    BREAST SURGERY    saline  implants  CARDIAC CATHETERIZATION    CAROTID ENDARTERECTOMY  1992  CAROTID ENDARTERECTOMY Right 02/13/2005  Re-do Right CE  CATARACT EXTRACTION, BILATERAL  2013  CHOLECYSTECTOMY, LAPAROSCOPIC  04/02/2014  Dr. Hortensia Ma  CORONARY ARTERY BYPASS GRAFT  2006  x3 Dr. Matt Song  ENDARTERECTOMY Left 07/20/2014  Procedure: ENDARTERECTOMY CAROTID-LEFT;  Surgeon: Mayo Speck, MD;  Location: [facility] OR;  Service: Vascular;  Laterality: Left;  EYE SURGERY Bilateral 10/2011  Cataract Left eye  SEPTOPLASTY    with bilateral inferior turbinate reductions  SPINE SURGERY  12/2015  TOTAL ABDOMINAL HYSTERECTOMY    UPPER GASTROINTESTINAL ENDOSCOPY    had polyps removed from esophagus  UPPER GI ENDOSCOPY   Maudie Sorrow, MS [facility] SLP Acute Rehab Services Office [phone] Chantal Comment 02/09/2024, 12:14 PM  VAS US  LOWER EXTREMITY VENOUS (DVT) Result Date: 02/08/2024  Lower Venous DVT Study Patient Name:  JAMILYA SARRAZIN  Date of Exam:   02/08/2024 Medical Rec #: [id number]       Accession #:    [id number] Date of Birth: 08/04/1941       Patient Gender: F Patient Age:   6 years Exam Location:  [facility] Procedure:      VAS US  LOWER EXTREMITY VENOUS (DVT) Referring Phys: Fraser Jackson XU --------------------------------------------------------------------------------  Indications: Stroke.  Risk Factors: None identified. Limitations: Poor ultrasound/tissue interface and patient positioning, patient immobility. Comparison Study: No prior studies. Performing Technologist: Lerry Ransom RVT  Examination Guidelines: A complete evaluation includes  B-mode imaging, spectral Doppler, color Doppler, and power Doppler as needed of all accessible portions of each vessel. Bilateral testing is considered an integral part of a complete examination. Limited examinations for reoccurring indications may be performed as noted. The reflux portion of the exam is performed with the patient in reverse Trendelenburg.   +---------+---------------+---------+-----------+----------+--------------+ RIGHT    CompressibilityPhasicitySpontaneityPropertiesThrombus Aging +---------+---------------+---------+-----------+----------+--------------+ CFV      Full           Yes      Yes                                 +---------+---------------+---------+-----------+----------+--------------+ SFJ      Full                                                        +---------+---------------+---------+-----------+----------+--------------+ FV Prox  Full                                                        +---------+---------------+---------+-----------+----------+--------------+ FV Mid   Full                                                        +---------+---------------+---------+-----------+----------+--------------+ FV Distal               Yes      Yes                                 +---------+---------------+---------+-----------+----------+--------------+ PFV      Full                                                        +---------+---------------+---------+-----------+----------+--------------+ POP      Full           Yes      Yes                                 +---------+---------------+---------+-----------+----------+--------------+ PTV      Full                                                        +---------+---------------+---------+-----------+----------+--------------+ PERO     Full                                                        +---------+---------------+---------+-----------+----------+--------------+   +---------+---------------+---------+-----------+----------+--------------+  LEFT     CompressibilityPhasicitySpontaneityPropertiesThrombus Aging +---------+---------------+---------+-----------+----------+--------------+ CFV      Full           Yes      Yes                                  +---------+---------------+---------+-----------+----------+--------------+ SFJ      Full                                                        +---------+---------------+---------+-----------+----------+--------------+ FV Prox  Full                                                        +---------+---------------+---------+-----------+----------+--------------+ FV Mid   Full                                                        +---------+---------------+---------+-----------+----------+--------------+ FV DistalFull                                                        +---------+---------------+---------+-----------+----------+--------------+ PFV      Full                                                        +---------+---------------+---------+-----------+----------+--------------+ POP      Full           Yes      Yes                                 +---------+---------------+---------+-----------+----------+--------------+ PTV      Full                                                        +---------+---------------+---------+-----------+----------+--------------+ PERO     Full                                                        +---------+---------------+---------+-----------+----------+--------------+     Summary: RIGHT: - There is no evidence of deep vein thrombosis in the lower extremity. However, portions of this examination were limited- see technologist comments above.  - No cystic structure found in the popliteal fossa.  LEFT: - There is no evidence  of deep vein thrombosis in the lower extremity. However, portions of this examination were limited- see technologist comments above.  - No cystic structure found in the popliteal fossa.  *See table(s) above for measurements and observations. Electronically signed by Angela Kell MD on 02/08/2024 at 2:59:07 PM.    Final      Signature  -   Seena Dadds M.D on 02/10/2024 at 10:25 AM   -  To  page go to [email or web address]

## 2024-02-10 NOTE — Progress Notes (Signed)
 Inpatient Rehab Admissions Coordinator:    I spoke with pt.'s husband again regarding CIR. Pt. Is awake today, but not speaking or indicating understanding beyond single step commands. Husband is struggling to decide whether he wants to pursue CIR or SNF. He keeps saying that Pt. Needs to be able to talk, walk, and mostly do for herself in order to return home. Realistically, I do not think that CIR could get Pt. To this level. I have asked TOC to talk with pt.'s husband about SNF in [place] area.   Wandalee Gust, MS, CCC-SLP Rehab Admissions Coordinator  [phone] (celll) [phone] (office)

## 2024-02-10 NOTE — Progress Notes (Signed)
 Speech Language Pathology Treatment: Dysphagia;Cognitive-Linguistic  Patient Details Name: Catherine Green MRN: [id number] DOB: 1940/11/20 Today's Date: 02/10/2024 Time: 4098-1191 SLP Time Calculation (min) (ACUTE ONLY): 39 min  Assessment / Plan / Recommendation Clinical Impression  Pt seen at bedside for skilled ST intervention targeting goals for swallow safety and effective communication. Pt's husband at bedside. MD informed SLP about pt difficulty with PO meds last night. SLP met with pt/husband at bedside and provided education on MBS. Pt noted to have a styrofoam cup of thin liquid (water) in the room. Pt accepted trials of nectar thick liquid (via teaspoon, cup, and straw) and puree. Right anterior leakage of liquids noted via cup. Oral holding noted with both textures, with audible swallow present. Immediate cough response elicited following NTL via straw. Delayed cough response noted after cup presentations. Pt appeared to tolerate teaspoon boluses of nectar thick liquid without cough response.   Documentation indicates pt did not aspirate during MBS yesterday, however, pt is at significantly increased risk for aspiration. Per MBS, pt exhibited sensori-motor oropharyngeal dysphagia with delayed oral transiting with impaired oral control resulting in premature spillage of boluses into pharynx, discoordination of swallow noted with pt propelling bolus from vallecula into oral cavity and reswallowing. Flash penetration, but no aspiration observed on this study.   Pt is at risk for aspiration due to significant dysphagia and global aphasia (which interferes with her ability to understand/follow directions and communicate effectively). Safe swallow precautions updated in pt room. Recommend nectar thick liquids via teaspoon bolus only to minimize aspiration risk. No thin liquids are recommended at this time.   Pt requires max multimodal cues due to severe receptive and expressive aphasia. Despite  cueing, she has difficulty following directions and answering simple yes/no questions. Speech is unintelligible at this time. ST will continue to follow acutely for dysphagia and aphasia treatment. Recommend continued ST intervention at next venue of care.   HPI HPI: Patient is an 83 year old female admitted to [facility] with acute onset right-sided weakness.  Patient found to have left MCA CVA evolving.  Swallow and speech eval ordered.  Patient resides with her spouse and was using a walker prior to admission due to back issues per staff.  PT has prior h/o dysphagia after ACDF - that resolved per chart review.  She underwent BSE 02/08/2024 and requires MBS due to CVA, motor planning deficits negating her ability to cough on command and s/s of aspiration.  SLP follow up for dysphagia/dysphasia indicated.      SLP Plan  Continue with current plan of care          Recommendations  Diet recommendations: Dysphagia 1 (puree);Nectar-thick liquid Liquids provided via: Teaspoon Medication Administration: Crushed with puree Supervision: Full supervision/cueing for compensatory strategies;Patient able to self feed Compensations: Slow rate;Small sips/bites;Other (Comment);Minimize environmental distractions (place boluses on left side of mouth) Postural Changes and/or Swallow Maneuvers: Seated upright 90 degrees;Upright 30-60 min after meal                  Oral care prior to ice chip/H20;Oral care QID   Frequent or constant Supervision/Assistance Dysphagia, oropharyngeal phase (R13.12)     Continue with current plan of care     Catherine Green  02/10/2024, 11:04 AM

## 2024-02-10 NOTE — Plan of Care (Signed)
  Problem: Education: Goal: Knowledge of disease or condition will improve Outcome: Progressing Goal: Knowledge of secondary prevention will improve (MUST DOCUMENT ALL) Outcome: Progressing   Problem: Ischemic Stroke/TIA Tissue Perfusion: Goal: Complications of ischemic stroke/TIA will be minimized Outcome: Progressing   Problem: Coping: Goal: Will verbalize positive feelings about self Outcome: Progressing   Problem: Self-Care: Goal: Ability to participate in self-care as condition permits will improve Outcome: Progressing

## 2024-02-10 NOTE — Progress Notes (Signed)
 Cardiology   We reviewed the ECGs with EP.  They think the rhythm is c/w atrial fib at time and agree that the patient should be on Eliquis .   The patient has been started on Eliquis .   Will sign off.  We will arrange follow up in the cardiology office      Ahmad Alert, MD  02/10/2024 6:04 PM    [facility] [address] [place],  [address] [place], [place]  [address] Phone: [phone]; Fax: [phone]

## 2024-02-10 NOTE — Care Management Important Message (Signed)
 Important Message  Patient Details  Name: Catherine Green MRN: [id number] Date of Birth: Dec 29, 1940   Important Message Given:  Yes - Medicare IM     Felix Host 02/10/2024, 3:03 PM

## 2024-02-10 NOTE — TOC Initial Note (Signed)
 Transition of Care [facility]) - Initial/Assessment Note    Patient Details  Name: Catherine Green MRN: [id number] Date of Birth: November 14, 1940  [facility] of [facility]) CM/SW Contact:    Jannice Mends, LCSW Phone Number: 02/10/2024, 3:27 PM  Clinical Narrative:                 CSW received consult for possible SNF placement at time of discharge. CSW spoke with patient's spouse (and daughter was with him). Patient's spouse reported that patient's spouse is currently unable to care for patient at their home given patient's current physical needs and fall risk. Patient's spouse expressed understanding of PT recommendation and is agreeable to SNF placement at time of discharge so that patient can be closer to [place]. CSW discussed insurance authorization process and will provide Medicare SNF ratings list. CSW provide SNF bed offers. Spouse has selected [employer]. CSW updated facility and will start insurance process once stable.      Skilled Nursing Rehab Facilities-   [email or web address]   Ratings out of 5 stars (5 the highest)  Name Address  Phone # Quality Care Staffing Health Inspection Overall  [facility] [address] [phone] 3 1 4 3   [facility] [address] [place] [phone] 5 2 4 5   [facility] [address], [facility] [phone] [facility] [address] [place] [phone] 4 3 4 4   [facility]  [address], [place] [phone] 5 3 5 5   [facility] [address] [facility] [phone] 5 3 2 3   [facility] [address] [place] [phone] 5 1 2 2   [facility] [address], [place] [phone] 2 3 3 3   [facility] ([facility]) [address], [place] [phone] 1 3 3 2   [facility] [address] [place], [place] [phone] 3 1 2 1   [facility] ([place]) [address],  [place] [phone] 3 1 1 1   [facility] [address] [place] [phone] 3 3 4 4   [facility] [address], [place] [phone] 4 4 3 3   [facility] ([facility]) [address], [place] [phone] 1 1 4 2           [facility] [address], [place] [phone] 3 1 5 4   [facility] [address] [place] [phone] 4 1 1 1   [facility]  [address], [place] [phone] 2 3 2 2   [facility] [address] [phone] 2 2 4 4   [facility] [address] [place] [address], [place] [phone] 2 2 3 3           [facility] [address], [place] [phone] 2 1 2 1   [facility] (No [facility]) [address], [place] [phone] 5 4 5 5   [facility] [address], [facility] [phone] 3 4 2 2   [facility] [address] [place] [phone] 3 1 2 1   [facility] [address] [place] [phone] 3 2 2 2   [facility] [address] [place] [phone] 1 3 3 2   [facility] [address] [place] [phone] 2 2 3 3   [facility] [address] [place], [place] [phone] 2 1 4 3   [facility] [address], [facility] [phone] 2 1 1 1   [facility] [address] [place] [phone] 2 1  2 1  [facility] [address] Bascom Lily [phone] 3 1 2 1           [facility] [address], [place] [phone] 4 1 2 1   [facility] [address] Albertine Alpha  [phone] 3 4 4 4   [facility] (No [employer]) [address] [place] [phone] 3 2 4 4   [facility]) [address], [place] [phone] 2 2 3 3   [facility] [address] [place] [phone] 5 3 5 5   [facility] [address], [place] [phone] 2 1 1 1   [facility] [address] [place], [place] [phone] 3 5 5 5            [facility] [address] [place], [place] [phone] 5 4 5 5   [facility])  [address], [place] [phone] 1 1 2 1   [facility] [address], [place] [phone]  2 4 4   [facility] [place] [address], [place] [phone] 3 5 5 5   [address] [place], [place] [phone] 4 2 2 2   [facility]) [address] [place] [phone] 1 1 3 1      Expected Discharge Plan: Skilled Nursing Facility Barriers to Discharge: Continued Medical Work up, Insurance Authorization   Patient Goals and CMS Choice Patient states their goals for this hospitalization and ongoing recovery are:: Rehab CMS Medicare.gov Compare Post Acute Care list provided to:: Patient Represenative (must comment) Choice offered to / list presented to : Spouse, Adult Children Crystal ownership interest in [facility].provided to:: Spouse    Expected Discharge Plan and Services In-house Referral: Clinical Social Work   Post Acute Care Choice: Skilled Nursing Facility Living arrangements for the past 2 months: Single Family Home                                      Prior Living Arrangements/Services Living arrangements for the past 2 months: Single Family Home Lives with:: Spouse Patient language and need for interpreter reviewed:: Yes Do you feel safe going back to the place where you live?: Yes      Need for Family Participation in Patient Care: Yes (Comment) Care giver support system in place?: Yes (comment)   Criminal Activity/Legal Involvement Pertinent to Current Situation/Hospitalization: No - Comment as needed  Activities of Daily Living      Permission Sought/Granted Permission sought to share information with : Facility Medical sales representative, Family Supports Permission granted to share information with : No  Share Information with NAME: Catherine Green  Permission granted to share info  w AGENCY: SNFs  Permission granted to share info w Relationship: Spouse  Permission granted to share info w Contact Information: [phone]  Emotional Assessment Appearance:: Appears stated age Attitude/Demeanor/Rapport: Unable to Assess Affect (typically observed): Unable to Assess Orientation: :  (not able to speak) Alcohol / Substance Use: Not Applicable Psych Involvement: No (comment)  Admission diagnosis:  Acute CVA (cerebrovascular accident) [facility] [I63.9] Cerebrovascular accident (CVA) due to occlusion of cerebral artery (HCC) [I63.50] Patient Active Problem List   Diagnosis Date Noted   Acute CVA (cerebrovascular accident) (HCC) 02/07/2024   Atrial fibrillation (HCC) 11/11/2023   Chronic obstructive pulmonary disease with acute exacerbation (HCC) 11/10/2023   Hair loss 09/22/2023   Cervical spondylosis with myelopathy and radiculopathy 01/11/2023  Muscle weakness (generalized) 07/17/2022   Mucous cyst of finger 05/17/2022   Acute recurrent frontal sinusitis 04/21/2021   Impacted cerumen of right ear 04/21/2021   History of diabetes mellitus 02/09/2021   Scalp irritation 11/13/2020   Seasonal allergies 07/12/2019   Anemia 02/05/2019   Rash 09/04/2018   GERD (gastroesophageal reflux disease) 09/04/2018   Elevated CK 07/30/2018   Neuropathy 04/24/2018   Health care maintenance 01/19/2018   Medicare annual wellness visit, initial 01/19/2018   Grief 10/07/2017   Sweating abnormality 05/28/2017   Insomnia 05/28/2017   Fatigue 01/15/2017   Dysuria 05/06/2016   Skin lesion 03/16/2016   Aftercare following surgery of the circulatory system 02/18/2016   Spinal stenosis of lumbar region 08/21/2015   Labile blood pressure 05/17/2015   Leg pain, bilateral 02/08/2015   SOB (shortness of breath) 02/08/2015   Cough 01/17/2015   Low back pain 11/05/2014   Benign paroxysmal positional vertigo 09/04/2014   Carotid stenosis 07/03/2014   Preoperative cardiovascular examination  03/07/2014   Esophageal dysphagia 12/25/2013   Chronic diastolic CHF (congestive heart failure) (HCC) 11/23/2013   Palpitations 11/23/2013   Urticaria 04/05/2013   Screening for colon cancer 05/27/2012   Hyperlipidemia 05/04/2012   Dizziness 04/11/2012   Anxiety and depression 03/10/2012   Carotid artery disease (HCC) 12/22/2011   Essential hypertension 10/08/2009   CAD, ARTERY BYPASS GRAFT 10/08/2009   PVD 10/08/2009   SYNCOPE, HX OF 10/08/2009   THROMBOPHLEBITIS, HX OF 10/08/2009   PCP:  Donnie Galea, MD Pharmacy:   [facility] - [place], [place] - [address] [place] [address] Phone: [phone] Fax: [phone]     Social Drivers of Health (SDOH) Social History: SDOH Screenings   Food Insecurity: No Food Insecurity (11/15/2023)  Housing: Low Risk  (11/15/2023)  Transportation Needs: No Transportation Needs (11/15/2023)  Utilities: Not At Risk (11/15/2023)  Alcohol Screen: Low Risk  (05/25/2023)  Depression (PHQ2-9): Low Risk  (05/25/2023)  Financial Resource Strain: Low Risk  (11/12/2023)  Physical Activity: Sufficiently Active (05/25/2023)  Social Connections: Moderately Integrated (11/11/2023)  Stress: No Stress Concern Present (05/25/2023)  Tobacco Use: Medium Risk (02/07/2024)  Health Literacy: Adequate Health Literacy (05/25/2023)   SDOH Interventions:     Readmission Risk Interventions     No data to display

## 2024-02-10 NOTE — Plan of Care (Signed)

## 2024-02-11 DIAGNOSIS — I639 Cerebral infarction, unspecified: Secondary | ICD-10-CM | POA: Diagnosis not present

## 2024-02-11 LAB — BASIC METABOLIC PANEL WITH GFR
Anion gap: 9 (ref 5–15)
BUN: 17 mg/dL (ref 8–23)
CO2: 24 mmol/L (ref 22–32)
Calcium: 8.9 mg/dL (ref 8.9–10.3)
Chloride: 111 mmol/L (ref 98–111)
Creatinine, Ser: 0.77 mg/dL (ref 0.44–1.00)
GFR, Estimated: >60 mL/min (ref >60)
Glucose, Bld: 97 mg/dL (ref 70–99)
Potassium: 3.9 mmol/L (ref 3.5–5.1)
Sodium: 144 mmol/L (ref 135–145)

## 2024-02-11 LAB — CBC
HCT: 38.8 % (ref 36.0–46.0)
Hemoglobin: 11.8 g/dL — ABNORMAL LOW (ref 12.0–15.0)
MCH: 26.4 pg (ref 26.0–34.0)
MCHC: 30.4 g/dL (ref 30.0–36.0)
MCV: 86.8 fL (ref 80.0–100.0)
Platelets: 251 10*3/uL (ref 150–400)
RBC: 4.47 MIL/uL (ref 3.87–5.11)
RDW: 13.8 % (ref 11.5–15.5)
WBC: 7.8 10*3/uL (ref 4.0–10.5)
nRBC: 0 % (ref 0.0–0.2)

## 2024-02-11 LAB — PHOSPHORUS: Phosphorus: 3.3 mg/dL (ref 2.5–4.6)

## 2024-02-11 LAB — MAGNESIUM: Magnesium: 2.4 mg/dL (ref 1.7–2.4)

## 2024-02-11 MED ORDER — ENSURE PLUS HIGH PROTEIN PO LIQD
237.0000 mL | Freq: Two times a day (BID) | ORAL | Status: DC
  Administered 2024-02-11 – 2024-02-13 (×4): 237 mL via ORAL

## 2024-02-11 MED ORDER — AMLODIPINE BESYLATE 5 MG PO TABS
5.0000 mg | ORAL_TABLET | Freq: Every day | ORAL | Status: DC
  Administered 2024-02-11: 5 mg via ORAL
  Filled 2024-02-11: qty 1

## 2024-02-11 MED ORDER — HYDRALAZINE HCL 20 MG/ML IJ SOLN
5.0000 mg | Freq: Four times a day (QID) | INTRAMUSCULAR | Status: DC | PRN
  Administered 2024-02-12: 5 mg via INTRAVENOUS
  Filled 2024-02-11: qty 1

## 2024-02-11 MED ORDER — HYDRALAZINE HCL 10 MG PO TABS
5.0000 mg | ORAL_TABLET | Freq: Once | ORAL | Status: AC
  Administered 2024-02-11: 5 mg via ORAL
  Filled 2024-02-11: qty 1

## 2024-02-11 MED ORDER — AMLODIPINE BESYLATE 5 MG PO TABS: 5.0000 mg | ORAL_TABLET | Freq: Every day | ORAL | Status: DC

## 2024-02-11 NOTE — Progress Notes (Signed)
 Physical Therapy Treatment Patient Details Name: Catherine Green MRN: [id number] DOB: Jun 15, 1941 Today's Date: 02/11/2024   History of Present Illness Patient is a 83 year old female with right side weakness, expressive aphasia, found to have large MCA stroke. History of HTN, GERD, CAD s/p CABG, A-fib, anemia, CHF, anxiety, depression, neuropathy, COPD, spinal stenosis s/p back surgery.    PT Comments  Pt appears to have made good progress toward goals, using R LE well and coordinating standing balance with lighter mod assist.  Emphasis on transitions, sit to stand x5 during peri care and pre gait activity before mod assist transfer bed to recliner     If plan is discharge home, recommend the following: A lot of help with walking and/or transfers;A lot of help with bathing/dressing/bathroom;Assistance with cooking/housework;Assist for transportation;Supervision due to cognitive status;Help with stairs or ramp for entrance   Can travel by private vehicle        Equipment Recommendations  Wheelchair (measurements PT);Hospital bed;BSC/3in1;Wheelchair cushion (measurements PT)    Recommendations for Other Services Rehab consult     Precautions / Restrictions Precautions Precautions: Fall Recall of Precautions/Restrictions: Impaired     Mobility  Bed Mobility Overal bed mobility: Needs Assistance Bed Mobility: Rolling, Sidelying to Sit Rolling: Mod assist, Used rails Sidelying to sit: Mod assist       General bed mobility comments: light mod assist with cues for bringing L side toward right side and up via R elbow/side    Transfers Overall transfer level: Needs assistance Equipment used: 1 person hand held assist Transfers: Sit to/from Stand, Bed to chair/wheelchair/BSC Sit to Stand: Mod assist (x5) Stand pivot transfers: Max assist         General transfer comment: cues for technique, assist forward and for boost, more for stability to attempt pivot to chair.  Pt unable to  pivot feet without significant assist    Ambulation/Gait               General Gait Details: unable to at this time   Stairs             Wheelchair Mobility     Tilt Bed    Modified Rankin (Stroke Patients Only)       Balance Overall balance assessment: Needs assistance Sitting-balance support: Feet supported, Bilateral upper extremity supported Sitting balance-Leahy Scale: Fair Sitting balance - Comments: CGA sitting EOB once positioned with feet square   Standing balance support: Bilateral upper extremity supported Standing balance-Leahy Scale: Poor Standing balance comment: reliant on therapist support.                            Communication Communication Communication: Impaired Factors Affecting Communication: Difficulty expressing self  Cognition Arousal: Alert Behavior During Therapy: Flat affect   PT - Cognitive impairments: Difficult to assess Difficult to assess due to: Impaired communication                       Following commands: Impaired (pt able to follow simple commands with cues and extra time.) Following commands impaired: Follows one step commands with increased time, Follows one step commands inconsistently    Cueing Cueing Techniques: Verbal cues, Tactile cues, Visual cues, Gestural cues  Exercises Other Exercises Other Exercises: warm up hip/knee flex/ext x10 bil with graded resistance.    General Comments General comments (skin integrity, edema, etc.): vss      Pertinent Vitals/Pain Pain Assessment Pain Assessment:  Faces Faces Pain Scale: No hurt Pain Intervention(s): Monitored during session    Home Living                          Prior Function            PT Goals (current goals can now be found in the care plan section) Acute Rehab PT Goals PT Goal Formulation: With family Time For Goal Achievement: 02/22/24 Potential to Achieve Goals: Fair Progress towards PT goals:  Progressing toward goals    Frequency    Min 3X/week      PT Plan      Co-evaluation              AM-PAC PT 6 Clicks Mobility   Outcome Measure  Help needed turning from your back to your side while in a flat bed without using bedrails?: A Lot Help needed moving from lying on your back to sitting on the side of a flat bed without using bedrails?: A Lot   Help needed standing up from a chair using your arms (e.g., wheelchair or bedside chair)?: A Lot Help needed to walk in hospital room?: Total Help needed climbing 3-5 steps with a railing? : Total 6 Click Score: 8    End of Session   Activity Tolerance: Patient tolerated treatment well;Patient limited by fatigue Patient left: with call bell/phone within reach;with family/visitor present;in chair;with chair alarm set Nurse Communication: Mobility status;Need for lift equipment (lift helpful, not necessary) PT Visit Diagnosis: Hemiplegia and hemiparesis;Other abnormalities of gait and mobility (R26.89) Hemiplegia - Right/Left: Right Hemiplegia - dominant/non-dominant: Dominant Hemiplegia - caused by: Cerebral infarction     Time: 1537-1610 PT Time Calculation (min) (ACUTE ONLY): 33 min  Charges:    $Therapeutic Activity: 8-22 mins $Neuromuscular Re-education: 8-22 mins PT General Charges $$ ACUTE PT VISIT: 1 Visit                     02/11/2024  Nohemi Batters., PT Acute Rehabilitation Services [phone]  (office)   Durell Gilding Alfredo Spong 02/11/2024, 4:32 PM

## 2024-02-11 NOTE — Plan of Care (Signed)

## 2024-02-11 NOTE — Progress Notes (Addendum)
 PROGRESS NOTE                                                                                                                                                                                                             Patient Demographics:    Catherine Green, is a 83 y.o. female, DOB - 10/23/1940, MVH:[id number]  Outpatient Primary MD for the patient is Donnie Galea, MD    LOS - 4  Admit date - 02/07/2024    Chief Complaint  Patient presents with   Code Stroke       Brief Narrative (HPI from H&P)       83 y.o. female with medical history significant of hypertension, hyperlipidemia, GERD, carotid artery disease, CAD status post CABG, atrial fibrillation, anemia, chronic diastolic CHF, anxiety, depression, neuropathy, COPD, spinal stenosis presened with right-sided weakness and expressive aphasia evaluation of large left MCA stroke..    Subjective:    Catherine Green today in bed, no significant events overnight as discussed with staff.  Assessment  & Plan :    Acute CVA  - Patient presents with right-sided weakness R more than leg, expressive aphasia, right-sided hemineglect.  -CT L MCA infarct with left MCA hyperdense sign -CTA head and neck left ICA occluded at siphon, as well as left MCA and ACA occlusio - Workup significant for large left MCA territory infarct. - 2D echo EF 60 to 65% with grade 3 diastolic dysfunction. -Dopplers negative for DVT - Patient started on aspirin  this admission, and now evidence of a flutter on telemetry monitor so she started on Eliquis  as well . - Now with evidence of A-fib per cardiology on telemetry monitoring , on Eliquis , no indication for 30 days heart monitor as discussed with cardiology.  Dysphagia - MBS 6/11, currently on dysphagia 1 with nectar thick, advance as tolerated. - Remains with significant aphasia, dysphagia, went for [facility] 6/11, currently on dysphagia 1 with nectar  thick will need close follow-up with SLP to see if diet can be advanced or if she will have enough oral intake, will give few days to allow room for improvement and close monitoring before determining next step.  Atrial arrhythmias A flutter,?  A-fib -She had frequent PACs, PVCs, no evidence of paroxysmal A-fib -Neurology input appreciated, she will need 30 days monitor on discharge -She was noted  to have a flutter event by cardiology team so started on Eliquis    Hypertension   -permissive hypertension, now goal less than 180/105, avoid low blood pressure given left ICA occlusion, long-term blood pressure goal is normotension - Blood pressure remains significantly elevated, so started on Norvasc , will add as needed hydralazine ,   Hyperlipidemia  - Continue rosuvastatin  and Zetia  as tolerated   Carotid artery disease  CAD, status post CABG   Continue ASA, rosuvastatin , Zetia  as tolerated. Holding antihypertensives as above - Heart rate in the 50s, so we will hold on resuming her beta-blockers.    Chronic diastolic CHF  > Last echo was in March with EF 60-65%, indeterminate diastolic function, normal RV function. Holding Lasix  as above   Anxiety - Depression  - Continue sertraline  if able   Neuropathy  - Continue gabapentin  as able   COPD   - Continue home Breo and albuterol           Condition - Extremely Guarded  Family Communication  : Husband in detail on 02/08/2024, 6/11, 6/12, none at bedside today  Code Status :  Full  Consults  :  Neuro, cardiology  PUD Prophylaxis :     Procedures  :     TTE  CTA- 1. 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 2. Limited CT perfusion imaging as described above. 3. 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 4. Unchanged 50% stenosis of the origin of the left common carotid artery and 75% stenosis of the brachiocephalic artery. 5. Partially  visualized small to moderate bilateral pleural effusions. 6.  Aortic Atherosclerosis (ICD10-I70.0).   MRI - 1. Moderate sized acute left MCA territory infarct. No associated hemorrhage or significant regional mass effect. 2. Loss of normal flow void within the left ICA and MCA, consistent with occlusion as seen on prior CTA. 3. Underlying mild to moderate chronic microvascular ischemic disease.      Disposition Plan  :    Status is: Inpatient   DVT Prophylaxis  :    SCD's Start: 02/07/24 1625 apixaban  (ELIQUIS ) tablet 5 mg    Lab Results  Component Value Date   PLT 251 02/11/2024    Diet :  Diet Order             DIET - DYS 1 Room service appropriate? Yes; Fluid consistency: Nectar Thick  Diet effective now                    Inpatient Medications  Scheduled Meds:  amLODipine   5 mg Oral Daily   apixaban   5 mg Oral BID   ezetimibe   10 mg Oral Daily   fluticasone  furoate-vilanterol  1 puff Inhalation Daily   gabapentin   400 mg Oral TID   rosuvastatin   40 mg Oral Daily   sertraline   150 mg Oral Daily   Continuous Infusions:   PRN Meds:.acetaminophen  **OR** acetaminophen  (TYLENOL ) oral liquid 160 mg/5 mL **OR** acetaminophen , albuterol , food thickener, hydrALAZINE , senna-docusate  Antibiotics  :    Anti-infectives (From admission, onward)    None         Objective:   Vitals:   02/11/24 0051 02/11/24 0052 02/11/24 0053 02/11/24 0437  BP:    (!) 189/69  Pulse: (!) 58 (!) 58 (!) 57 67  Resp: 13 13 13 13   Temp:    98 F (36.7 C)  TempSrc:    Oral  SpO2: 98%  98% 98% 98%  Weight:      Height:        Wt Readings from Last 3 Encounters:  02/07/24 62.4 kg  01/28/24 60.8 kg  12/20/23 60 kg    No intake or output data in the 24 hours ending 02/11/24 1002    Physical Exam   Awake Alert, she is with significant expressive aphasia, dense right-sided weakness Symmetrical Chest wall movement, Good air movement bilaterally, CTAB RRR,No Gallops,Rubs  or new Murmurs, No Parasternal Heave +ve B.Sounds, Abd Soft, No tenderness, No rebound - guarding or rigidity. No Cyanosis, Clubbing or edema, No new Rash or bruise         Data Review:    Recent Labs  Lab 02/07/24 1419 02/07/24 1437 02/07/24 1451 02/08/24 0525 02/10/24 0723 02/11/24 0643  WBC 11.4*  --   --  7.2 7.2 7.8  HGB 12.8 13.9 14.6 13.4 11.0* 11.8*  HCT 40.8 41.0 43.0 41.9 35.6* 38.8  PLT 281  --   --  305 250 251  MCV 86.4  --   --  84.0 85.8 86.8  MCH 27.1  --   --  26.9 26.5 26.4  MCHC 31.4  --   --  32.0 30.9 30.4  RDW 13.7  --   --  14.1 13.9 13.8  LYMPHSABS 0.9  --   --   --   --   --   MONOABS 0.6  --   --   --   --   --   EOSABS 0.0  --   --   --   --   --   BASOSABS 0.0  --   --   --   --   --     Recent Labs  Lab 02/07/24 1419 02/07/24 1437 02/07/24 1451 02/07/24 1452 02/08/24 0525 02/08/24 0712 02/10/24 0723 02/11/24 0643  NA 137 139 138  --  141  --  144 144  K 4.2 4.2 3.6  --  3.9  --  3.4* 3.9  CL 104 102 102  --  102  --  111 111  CO2 24  --   --   --  25  --  23 24  ANIONGAP 9  --   --   --  14  --  10 9  GLUCOSE 142* 143* 225*  --  154*  --  92 97  BUN 15 17 16   --  16  --  16 17  CREATININE 0.93 1.00 0.90  --  1.10*  --  0.79 0.77  AST 22  --   --   --  25  --   --   --   ALT 14  --   --   --  15  --   --   --   ALKPHOS 101  --   --   --  110  --   --   --   BILITOT 0.4  --   --   --  0.7  --   --   --   ALBUMIN 4.0  --   --   --  4.0  --   --   --   LATICACIDVEN  --   --   --  1.6  --   --   --   --   INR 1.0  --   --   --   --   --   --   --  HGBA1C  --   --   --   --  5.8*  --   --   --   MG  --   --   --   --   --  2.5*  --  2.4  PHOS  --   --   --   --   --   --   --  3.3  CALCIUM  8.6*  --   --   --  9.3  --  8.7* 8.9      Recent Labs  Lab 02/07/24 1419 02/07/24 1452 02/08/24 0525 02/08/24 0712 02/10/24 0723 02/11/24 0643  LATICACIDVEN  --  1.6  --   --   --   --   INR 1.0  --   --   --   --   --   HGBA1C  --    --  5.8*  --   --   --   MG  --   --   --  2.5*  --  2.4  CALCIUM  8.6*  --  9.3  --  8.7* 8.9    --------------------------------------------------------------------------------------------------------------- Lab Results  Component Value Date   CHOL 283 (H) 02/08/2024   HDL 70 02/08/2024   LDLCALC 199 (H) 02/08/2024   LDLDIRECT 139.1 04/11/2012   TRIG 71 02/08/2024   CHOLHDL 4.0 02/08/2024    Lab Results  Component Value Date   HGBA1C 5.8 (H) 02/08/2024    Radiology Report ECHOCARDIOGRAM COMPLETE Result Date: 02/09/2024    ECHOCARDIOGRAM REPORT   Patient Name:   SANELA EVOLA Date of Exam: 02/09/2024 Medical Rec #:  [id number]      Height:       62.0 in Accession #:    [id number]     Weight:       137.6 lb Date of Birth:  [birth year]      BSA:          1.631 m Patient Age:    82 years       BP:           146/60 mmHg Patient Gender: F              HR:           68 bpm. Exam Location:  Inpatient Procedure: 2D Echo, Cardiac Doppler and Color Doppler (Both Spectral and Color            Flow Doppler were utilized during procedure). Indications:    Stroke  History:        Patient has prior history of Echocardiogram examinations, most                 recent 11/10/2023. Risk Factors:Hypertension.  Sonographer:    Jeralene Mom Referring Phys: [id number] TAYLOR A PARCELLS IMPRESSIONS  1. Left ventricular ejection fraction, by estimation, is 60 to 65%. The left ventricle has normal function. The left ventricle has no regional wall motion abnormalities. Left ventricular diastolic parameters are consistent with Grade III diastolic dysfunction (restrictive).  2. Right ventricular systolic function is mildly reduced. The right ventricular size is normal. There is mildly elevated pulmonary artery systolic pressure.  3. Left atrial size was moderately dilated.  4. The mitral valve is normal in structure. Trivial mitral valve regurgitation. No evidence of mitral stenosis.  5. The aortic valve is grossly normal.  There is mild calcification of the aortic valve. There is mild thickening of the aortic valve. Aortic valve regurgitation is not visualized. Aortic  valve sclerosis/calcification is present, without any evidence of aortic stenosis.  6. The inferior vena cava is normal in size with greater than 50% respiratory variability, suggesting right atrial pressure of 3 mmHg. Comparison(s): No significant change from prior study. Conclusion(s)/Recommendation(s): Otherwise normal echocardiogram, with minor abnormalities described in the report. No intracardiac source of embolism detected on this transthoracic study. Consider a transesophageal echocardiogram to exclude cardiac source of embolism if clinically indicated. FINDINGS  Left Ventricle: Left ventricular ejection fraction, by estimation, is 60 to 65%. The left ventricle has normal function. The left ventricle has no regional wall motion abnormalities. The left ventricular internal cavity size was normal in size. There is  no left ventricular hypertrophy. Left ventricular diastolic parameters are consistent with Grade III diastolic dysfunction (restrictive). Right Ventricle: The right ventricular size is normal. No increase in right ventricular wall thickness. Right ventricular systolic function is mildly reduced. There is mildly elevated pulmonary artery systolic pressure. The tricuspid regurgitant velocity  is 3.01 m/s, and with an assumed right atrial pressure of 3 mmHg, the estimated right ventricular systolic pressure is 39.2 mmHg. Left Atrium: Left atrial size was moderately dilated. Right Atrium: Right atrial size was normal in size. Pericardium: There is no evidence of pericardial effusion. Mitral Valve: The mitral valve is normal in structure. Trivial mitral valve regurgitation. No evidence of mitral valve stenosis. Tricuspid Valve: The tricuspid valve is normal in structure. Tricuspid valve regurgitation is trivial. No evidence of tricuspid stenosis. Aortic  Valve: The aortic valve is grossly normal. There is mild calcification of the aortic valve. There is mild thickening of the aortic valve. Aortic valve regurgitation is not visualized. Aortic valve sclerosis/calcification is present, without any evidence of aortic stenosis. Aortic valve mean gradient measures 8.0 mmHg. Aortic valve peak gradient measures 13.9 mmHg. Aortic valve area, by VTI measures 1.17 cm. Pulmonic Valve: The pulmonic valve was not well visualized. Pulmonic valve regurgitation is not visualized. No evidence of pulmonic stenosis. Aorta: The aortic root, ascending aorta, aortic arch and descending aorta are all structurally normal, with no evidence of dilitation or obstruction. Venous: The inferior vena cava is normal in size with greater than 50% respiratory variability, suggesting right atrial pressure of 3 mmHg. IAS/Shunts: The atrial septum is grossly normal.  LEFT VENTRICLE PLAX 2D LVIDd:         3.70 cm   Diastology LVIDs:         2.50 cm   LV e' medial:    5.87 cm/s LV PW:         0.80 cm   LV E/e' medial:  25.2 LV IVS:        0.80 cm   LV e' lateral:   6.20 cm/s LVOT diam:     1.70 cm   LV E/e' lateral: 23.9 LV SV:         52 LV SV Index:   32 LVOT Area:     2.27 cm  RIGHT VENTRICLE RV Basal diam:  3.40 cm RV Mid diam:    3.00 cm RV S prime:     9.14 cm/s TAPSE (M-mode): 1.3 cm LEFT ATRIUM             Index        RIGHT ATRIUM           Index LA diam:        3.60 cm 2.21 cm/m   RA Area:     12.40 cm LA Vol (A2C):   72.2 ml  44.28 ml/m  RA Volume:   25.40 ml  15.58 ml/m LA Vol (A4C):   45.4 ml 27.84 ml/m LA Biplane Vol: 58.1 ml 35.63 ml/m  AORTIC VALVE AV Area (Vmax):    1.28 cm AV Area (Vmean):   1.25 cm AV Area (VTI):     1.17 cm AV Vmax:           186.67 cm/s AV Vmean:          130.667 cm/s AV VTI:            0.445 m AV Peak Grad:      13.9 mmHg AV Mean Grad:      8.0 mmHg LVOT Vmax:         105.00 cm/s LVOT Vmean:        71.700 cm/s LVOT VTI:          0.229 m LVOT/AV VTI ratio:  0.51  AORTA Ao Root diam: 2.30 cm Ao Asc diam:  2.50 cm MITRAL VALVE                TRICUSPID VALVE MV Area (PHT): 4.15 cm     TR Peak grad:   36.2 mmHg MV Decel Time: 183 msec     TR Vmax:        301.00 cm/s MR Peak grad: 91.4 mmHg MR Vmax:      478.00 cm/s   SHUNTS MV E velocity: 148.00 cm/s  Systemic VTI:  0.23 m MV A velocity: 63.60 cm/s   Systemic Diam: 1.70 cm MV E/A ratio:  2.33 Sheryle Donning MD Electronically signed by Sheryle Donning MD Signature Date/Time: 02/09/2024/12:51:45 PM    Final      Signature  -   Seena Dadds M.D on 02/11/2024 at 10:02 AM   -  To page go to [email or web address]

## 2024-02-11 NOTE — Progress Notes (Signed)
 Speech Language Pathology Treatment: Dysphagia;Cognitive-Linguistic  Patient Details Name: Catherine Green MRN: [id number] DOB: [birth year] Today's Date: 02/11/2024 Time: 4098-1191 SLP Time Calculation (min) (ACUTE ONLY): 32 min  Assessment / Plan / Recommendation Clinical Impression  SLP conducted skilled therapy session targeting dysphagia and communication goals. Upon SLP entry, patient's husband present at bedside and providing information re: previous speech therapy session, clearly carrying overt education from SLP accurately. SLP assisted with patient repositioning and provided NTLs via teaspoon and cup sip. No overt s/sx of penetration/aspiration observed. Patient demonstrating improved self-feeding capabilities benefiting from placement of cup in hand, but then independently bringing cup to mouth and sipping with minimal instances of anterior oral spillage. With teaspoons of thin liquids, likewise no overt s/sx of penetration/aspiration noted, however patient did exhibit immediate cough with cup sips and significantly increased anterior oral spillage with thins when compared to NTL. RN entered and administered medications crushed in puree, patient tolerated without difficulty. Recommend continuation of Dys1/NTLs with patient able to take cup sips rather than just teaspoon sips when alert.  During communication tasks, patient exhibiting improvements re: following directions. She benefited from max cues to follow basic one-step functional directions in 2/5 opportunities. Patient verbalizing, however speech is characterized by jargon/neologisms and is uninterpretable. SLP tested patient with communication board. Patient able ot point to items given total assist but unable to communicate with the board independently to state wants/needs. Requires total hand over hand assist to point to named item given field of 2. Patient was left in room with call bell in reach and alarm set. SLP will continue to  target goals per plan of care.     HPI HPI: Patient is an 83 year old female admitted to [facility] with acute onset right-sided weakness.  Patient found to have left MCA CVA evolving.  Swallow and speech eval ordered.  Patient resides with her spouse and was using a walker prior to admission due to back issues per staff.  PT has prior h/o dysphagia after ACDF - that resolved per chart review.  She underwent BSE 02/08/2024 and requires MBS due to CVA, motor planning deficits negating her ability to cough on command and s/s of aspiration.  SLP follow up for dysphagia/dysphasia indicated.      SLP Plan  Continue with current plan of care          Recommendations  Diet recommendations: Dysphagia 1 (puree);Nectar-thick liquid Liquids provided via: Teaspoon Medication Administration: Crushed with puree Supervision: Full supervision/cueing for compensatory strategies;Patient able to self feed Compensations: Slow rate;Small sips/bites;Other (Comment);Minimize environmental distractions Postural Changes and/or Swallow Maneuvers: Seated upright 90 degrees;Upright 30-60 min after meal                Rehab consult Oral care prior to ice chip/H20;Oral care QID   Frequent or constant Supervision/Assistance Dysphagia, oropharyngeal phase (R13.12)     Continue with current plan of care   Catherine Green, M.A., CCC-SLP   Catherine Green  02/11/2024, 12:46 PM

## 2024-02-11 NOTE — Progress Notes (Signed)
 OT NOTE  RN STAFF  Please check splint every 2 hours during shift ( remove splint ) to assess for: * pain * redness *swelling  If any symptoms above present remove splint for 15 minutes. If symptoms continue - keep the splint removed and notify OT staff [phone] immediately.   Keep the UE elevated at all times on pillows / towels.  Splint should have two splint covers (blue cloth) with a mesh bag for cleaning. The splint cover (blue cloth) should be washed with soapy water and hung out to dry or washed on delicate in home washer. Do not throw away splint cover it is washable. Please have a set location to store the splints in patients room for daily application.    Splints are to be worn for 2 hours and off for 2 hours  Examples of schedule:  Splints off at 6am Splints on at 8 am Splints off at 12 pm Splints on at 2 pm Splints off at 6pm Splints on at 8 pm    To place the splint on:  1. Place the wrist in position first and secure strap 2. Position each digit and apply strap 3. The thumb and forearm strap should be applied last   The splints should fit as appeared here Strap over the PIP joint of finger Strap over the MCP ( knuckles)  joints of the hand Strap at the thumb Strap at the wrist Strap at the forearm   02/11/2024  AB, OTR/L  Acute Rehabilitation Services  Office: [phone]

## 2024-02-11 NOTE — Progress Notes (Signed)
 Occupational Therapy Treatment Patient Details Name: Catherine Green MRN: [id number] DOB: 08-13-1941 Today's Date: 02/11/2024   History of present illness Patient is a 83 year old female with right side weakness, expressive aphasia, found to have large MCA stroke. History of HTN, GERD, CAD s/p CABG, A-fib, anemia, CHF, anxiety, depression, neuropathy, COPD, spinal stenosis s/p back surgery.   OT comments  Pt attention to R side noted to be improving, today her RUE was noted to have intermittent periods of light grasp capability and return of wrist ext. She still makes efforts to compensate using RUE with gross shoulder movements, no biceps activation noted. Worked with pt on standing using NDT strategies to facilitate hips, pt standing mod A without buckling of RLE, not able to offload without ext assist at this time. Her splint is intact without noteable skin breakdown. She initiates grooming when given visual cues. OT to continue to progress pt as able. DC plan remains appropriate for skilled rehab <3hrs of therapy.         If plan is discharge home, recommend the following:  A lot of help with walking and/or transfers;Two people to help with walking and/or transfers;A lot of help with bathing/dressing/bathroom;Direct supervision/assist for financial management;Direct supervision/assist for medications management;Supervision due to cognitive status   Equipment Recommendations  BSC/3in1;Wheelchair (measurements OT);Wheelchair cushion (measurements OT);Hospital bed;Tub/shower seat    Recommendations for Other Services      Precautions / Restrictions Precautions Precautions: Fall Recall of Precautions/Restrictions: Impaired Required Braces or Orthoses: Splint/Cast Splint/Cast: R resting hand splint Splint/Cast - Date Prophylactic Dressing Applied (if applicable): 02/10/24 Restrictions Weight Bearing Restrictions Per Provider Order: No       Mobility Bed Mobility Overal bed mobility:  Needs Assistance Bed Mobility: Sit to Supine       Sit to supine: Mod assist, +2 for physical assistance   General bed mobility comments: pt initiating trunk and LLE control to return to supine.    Transfers Overall transfer level: Needs assistance Equipment used: 1 person hand held assist Transfers: Sit to/from Stand, Bed to chair/wheelchair/BSC Sit to Stand: Mod assist Stand pivot transfers: Max assist         General transfer comment: STSx4 Mod A from recliner using NDT techniques to cue hips and rise into standing/sitting positing, pt holding chair with LUE. Reaching in standing with LUE, worked on weight shifting L<>R with max A from OT. No overt buckling of RLE noted. Pt standing >3 mins at one time for rear pericare.     Balance Overall balance assessment: Needs assistance Sitting-balance support: Feet supported, Bilateral upper extremity supported Sitting balance-Leahy Scale: Fair Sitting balance - Comments: sitting EOB CGA   Standing balance support: Single extremity supported Standing balance-Leahy Scale: Poor Standing balance comment: reliant on ext support                           ADL either performed or assessed with clinical judgement   ADL Overall ADL's : Needs assistance/impaired Eating/Feeding: Sitting;Minimal assistance Eating/Feeding Details (indicate cue type and reason): Pt utlizing LUE to feed herself upon OT entry Grooming: Sitting;Maximal assistance;Wash/dry face Grooming Details (indicate cue type and reason): Pt utilizing LUE to wash her face and her RUE when cues. hand over hand assist needed to apply lotion to BLEs using RUE and  positioning RUE for bilat integration of lotion application.     Lower Body Bathing: Sitting/lateral leans;Maximal assistance  Toileting- Architect and Hygiene: Sit to/from stand;Total assistance Toileting - Clothing Manipulation Details (indicate cue type and reason): rear  pericare in standing.            Extremity/Trunk Assessment Upper Extremity Assessment RUE Deficits / Details: hypertonicity with elbow flexion PROM. Pt demonstrating activation of R shoulder elevation and initating some adduction. No firing of the biceps/triceps noted as of 6/13, able to extend wrist 10 deg in gravity eliminated and inconsistent but light grasp. Min scapular protraction and retraction. Needs increased cues to perform tasks with R side. RUE Sensation: decreased light touch;decreased proprioception RUE Coordination: decreased fine motor;decreased gross motor            Vision   Additional Comments: Pt gaze improving with her attending to R while following visual stimuli.   Perception Perception Perception: Impaired Preception Impairment Details: Inattention/Neglect;Spatial orientation Perception-Other Comments: R inattention, seems to be improving. Pt attending to R side when prompted.   Praxis Praxis Praxis: Impaired Praxis Impairment Details: Ideomotor;Perseveration Praxis-Other Comments: Pt using items during ADLs accuractely, slight perseveration of pt removing grime from L finger nails.   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    Shoulder Instructions       General Comments VSS on supplemental 02. NT at bedside replacing purewick. Pt skin without any signs of irriation or breakdown from splint but there was notable compression from splint straps, pt R arm noted to be sandwiched in the chair which may have led to excessive compression.    Pertinent Vitals/ Pain       Pain Assessment Pain Assessment: Faces Faces Pain Scale: Hurts a little bit Pain Location: R shoulder with elevation and retraction Pain Descriptors / Indicators: Grimacing Pain Intervention(s): Monitored during session, Limited activity within patient's tolerance   Frequency  Min 2X/week        Progress Toward Goals  OT Goals(current goals can now be found in the care plan section)  Progress towards OT goals: Progressing toward goals  Acute Rehab OT Goals Patient Stated Goal: husband hoping for pt improvements, be able to go home OT Goal Formulation: With family Time For Goal Achievement: 02/22/24 Potential to Achieve Goals: Good  Plan      Co-evaluation                 AM-PAC OT 6 Clicks Daily Activity     Outcome Measure   Help from another person eating meals?: A Little Help from another person taking care of personal grooming?: A Lot Help from another person toileting, which includes using toliet, bedpan, or urinal?: Total Help from another person bathing (including washing, rinsing, drying)?: A Lot Help from another person to put on and taking off regular upper body clothing?: A Lot Help from  another person to put on and taking off regular lower body clothing?: Total 6 Click Score: 11    End of Session Equipment Utilized During Treatment: Gait belt  OT Visit Diagnosis: Unsteadiness on feet (R26.81);Other abnormalities of gait and mobility (R26.89);Muscle weakness (generalized) (M62.81);Hemiplegia and hemiparesis Hemiplegia - Right/Left:  Right Hemiplegia - dominant/non-dominant: Dominant Hemiplegia - caused by: Cerebral infarction   Activity Tolerance Patient tolerated treatment well   Patient Left in bed;with call bell/phone within reach;with bed alarm set   Nurse Communication Mobility status        Time: 1725-1759 OT Time Calculation (min): 34 min  Charges: OT General Charges $OT Visit: 1 Visit OT Treatments $Therapeutic Activity: 8-22 mins $Neuromuscular Re-education: 8-22 mins  02/11/2024  AB, OTR/L  Acute Rehabilitation Services  Office: [phone]   Catherine Green 02/11/2024, 6:23 PM

## 2024-02-11 NOTE — Progress Notes (Signed)
 IP rehab admissions - Noted plans for SNF in [place] area once medically ready.  We will not pursue CIR admission at this time.  See note from SW on 6/12.  [phone]

## 2024-02-12 DIAGNOSIS — I639 Cerebral infarction, unspecified: Secondary | ICD-10-CM | POA: Diagnosis not present

## 2024-02-12 MED ORDER — AMLODIPINE BESYLATE 10 MG PO TABS
10.0000 mg | ORAL_TABLET | Freq: Every day | ORAL | Status: DC
  Administered 2024-02-12 – 2024-02-18 (×7): 10 mg via ORAL
  Filled 2024-02-12 (×7): qty 1

## 2024-02-12 MED ORDER — AMLODIPINE BESYLATE 10 MG PO TABS: 10.0000 mg | ORAL_TABLET | Freq: Every day | ORAL | Status: DC

## 2024-02-12 NOTE — Progress Notes (Signed)
 PROGRESS NOTE                                                                                                                                                                                                             Patient Demographics:    Catherine Green, is a 83 y.o. female, DOB - 06-21-1941, ZOX:[id number]  Outpatient Primary MD for the patient is Donnie Galea, MD    LOS - 5  Admit date - 02/07/2024    Chief Complaint  Patient presents with   Code Stroke       Brief Narrative (HPI from H&P)       83 y.o. female with medical history significant of hypertension, hyperlipidemia, GERD, carotid artery disease, CAD status post CABG, atrial fibrillation, anemia, chronic diastolic CHF, anxiety, depression, neuropathy, COPD, spinal stenosis presened with right-sided weakness and expressive aphasia evaluation of large left MCA stroke.   Subjective:    [name] today in bed, no significant events overnight as discussed with staff, as discussed with husband, her appetite is good.   Assessment  & Plan :    Acute CVA  - Patient presents with right-sided weakness R more than leg, expressive aphasia, right-sided hemineglect.  -CT L MCA infarct with left MCA hyperdense sign -CTA head and neck left ICA occluded at siphon, as well as left MCA and ACA occlusio - Workup significant for large left MCA territory infarct. - 2D echo EF 60 to 65% with grade 3 diastolic dysfunction. -Dopplers negative for DVT - Patient started on aspirin  this admission, and now evidence of a flutter on telemetry monitor so she started on Eliquis  as well . - Now with evidence of A-fib per cardiology on telemetry monitoring , on Eliquis , no indication for 30 days heart monitor as discussed with cardiology.  Dysphagia - MBS 6/11, currently on dysphagia 1 with nectar thick, advance as tolerated. - Remains with significant aphasia, dysphagia, went for  [facility] 6/11, currently on dysphagia 1 with nectar thick will need close follow-up with SLP to see if diet can be advanced or if she will have enough oral intake, overall she has been having good oral intake with encouragement, so hoping if she has enough oral intake will avoid feeding tube.    Atrial arrhythmias A flutter,?  A-fib -She had frequent PACs, PVCs, no evidence of  paroxysmal A-fib -Neurology input appreciated, she will need 30 days monitor on discharge -She was noted to have a flutter event by cardiology team so started on Eliquis    Hypertension   -permissive hypertension, now goal less than 180/105, avoid low blood pressure given left ICA occlusion, long-term blood pressure goal is normotension - Blood pressure remains significantly elevated, so started on Norvasc , will increase dose today to 10 mg daily.  Continue with as needed hydralazine ,   Hyperlipidemia  - Continue rosuvastatin  and Zetia  as tolerated   Carotid artery disease  CAD, status post CABG   Continue ASA, rosuvastatin , Zetia  as tolerated. Holding antihypertensives as above - Heart rate in the 50s, so we will hold on resuming her beta-blockers.    Chronic diastolic CHF  > Last echo was in March with EF 60-65%, indeterminate diastolic function, normal RV function. Holding Lasix  as above   Anxiety - Depression  - Continue sertraline  if able   Neuropathy  - Continue gabapentin  as able   COPD   - Continue home Breo and albuterol           Condition - Extremely Guarded  Family Communication  : Cussed with husband at bedside daily.    Code Status :  Full  Consults  :  Neuro, cardiology  PUD Prophylaxis :     Procedures  :     TTE  CTA- 1. 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 2. Limited CT perfusion imaging as described above. 3. 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 4. Unchanged 50% stenosis of the  origin of the left common carotid artery and 75% stenosis of the brachiocephalic artery. 5. Partially visualized small to moderate bilateral pleural effusions. 6.  Aortic Atherosclerosis (ICD10-I70.0).   MRI - 1. Moderate sized acute left MCA territory infarct. No associated hemorrhage or significant regional mass effect. 2. Loss of normal flow void within the left ICA and MCA, consistent with occlusion as seen on prior CTA. 3. Underlying mild to moderate chronic microvascular ischemic disease.      Disposition Plan  :    Status is: Inpatient   DVT Prophylaxis  :    SCD's Start: 02/07/24 1625 apixaban  (ELIQUIS ) tablet 5 mg    Lab Results  Component Value Date   PLT 251 02/11/2024    Diet :  Diet Order             DIET - DYS 1 Room service appropriate? Yes; Fluid consistency: Nectar Thick  Diet effective now                    Inpatient Medications  Scheduled Meds:  amLODipine   10 mg Oral Daily   apixaban   5 mg Oral BID   ezetimibe   10 mg Oral Daily   feeding supplement  237 mL Oral BID BM   fluticasone  furoate-vilanterol  1 puff Inhalation Daily   gabapentin   400 mg Oral TID   rosuvastatin   40 mg Oral Daily   sertraline   150 mg Oral Daily   Continuous Infusions:   PRN Meds:.acetaminophen  **OR** acetaminophen  (TYLENOL ) oral liquid 160 mg/5 mL **OR** acetaminophen , albuterol , food thickener, hydrALAZINE , senna-docusate  Antibiotics  :    Anti-infectives (From admission, onward)    None         Objective:   Vitals:   02/11/24 1802 02/11/24 2000 02/12/24 0400 02/12/24 0800  BP: (!) 154/45 (!) 163/52 (!) 131/51 Aaron Aas)  148/54  Pulse: 64 61 71 68  Resp: 14 15 14 14   Temp: (!) 97.1 F (36.2 C) 98 F (36.7 C) 98 F (36.7 C) 98.2 F (36.8 C)  TempSrc: Oral Oral Oral Oral  SpO2: 99% 97% 99% 97%  Weight:      Height:        Wt Readings from Last 3 Encounters:  02/07/24 62.4 kg  01/28/24 60.8 kg  12/20/23 60 kg     Intake/Output Summary (Last 24  hours) at 02/12/2024 1016 Last data filed at 02/12/2024 0900 Gross per 24 hour  Intake 100 ml  Output 650 ml  Net -550 ml      Physical Exam   Awake Alert, she is with significant expressive aphasia, dense right-sided weakness Symmetrical Chest wall movement, Good air movement bilaterally, CTAB RRR,No Gallops,Rubs or new Murmurs, No Parasternal Heave +ve B.Sounds, Abd Soft, No tenderness, No rebound - guarding or rigidity. No Cyanosis, Clubbing or edema, No new Rash or bruise          Data Review:    Recent Labs  Lab 02/07/24 1419 02/07/24 1437 02/07/24 1451 02/08/24 0525 02/10/24 0723 02/11/24 0643  WBC 11.4*  --   --  7.2 7.2 7.8  HGB 12.8 13.9 14.6 13.4 11.0* 11.8*  HCT 40.8 41.0 43.0 41.9 35.6* 38.8  PLT 281  --   --  305 250 251  MCV 86.4  --   --  84.0 85.8 86.8  MCH 27.1  --   --  26.9 26.5 26.4  MCHC 31.4  --   --  32.0 30.9 30.4  RDW 13.7  --   --  14.1 13.9 13.8  LYMPHSABS 0.9  --   --   --   --   --   MONOABS 0.6  --   --   --   --   --   EOSABS 0.0  --   --   --   --   --   BASOSABS 0.0  --   --   --   --   --     Recent Labs  Lab 02/07/24 1419 02/07/24 1437 02/07/24 1451 02/07/24 1452 02/08/24 0525 02/08/24 0712 02/10/24 0723 02/11/24 0643  NA 137 139 138  --  141  --  144 144  K 4.2 4.2 3.6  --  3.9  --  3.4* 3.9  CL 104 102 102  --  102  --  111 111  CO2 24  --   --   --  25  --  23 24  ANIONGAP 9  --   --   --  14  --  10 9  GLUCOSE 142* 143* 225*  --  154*  --  92 97  BUN 15 17 16   --  16  --  16 17  CREATININE 0.93 1.00 0.90  --  1.10*  --  0.79 0.77  AST 22  --   --   --  25  --   --   --   ALT 14  --   --   --  15  --   --   --   ALKPHOS 101  --   --   --  110  --   --   --   BILITOT 0.4  --   --   --  0.7  --   --   --   ALBUMIN 4.0  --   --   --  4.0  --   --   --   LATICACIDVEN  --   --   --  1.6  --   --   --   --   INR 1.0  --   --   --   --   --   --   --   HGBA1C  --   --   --   --  5.8*  --   --   --   MG  --   --    --   --   --  2.5*  --  2.4  PHOS  --   --   --   --   --   --   --  3.3  CALCIUM  8.6*  --   --   --  9.3  --  8.7* 8.9      Recent Labs  Lab 02/07/24 1419 02/07/24 1452 02/08/24 0525 02/08/24 0712 02/10/24 0723 02/11/24 0643  LATICACIDVEN  --  1.6  --   --   --   --   INR 1.0  --   --   --   --   --   HGBA1C  --   --  5.8*  --   --   --   MG  --   --   --  2.5*  --  2.4  CALCIUM  8.6*  --  9.3  --  8.7* 8.9    --------------------------------------------------------------------------------------------------------------- Lab Results  Component Value Date   CHOL 283 (H) 02/08/2024   HDL 70 02/08/2024   LDLCALC 199 (H) 02/08/2024   LDLDIRECT 139.1 04/11/2012   TRIG 71 02/08/2024   CHOLHDL 4.0 02/08/2024    Lab Results  Component Value Date   HGBA1C 5.8 (H) 02/08/2024    Radiology Report No results found.    Signature  -   Seena Dadds M.D on 02/12/2024 at 10:16 AM   -  To page go to [email or web address]

## 2024-02-12 NOTE — Progress Notes (Signed)
 Physical Therapy Treatment Patient Details Name: Catherine Green MRN: [id number] DOB: 04/24/1941 Today's Date: 02/12/2024   History of Present Illness Patient is a 83 year old female with right side weakness, expressive aphasia, found to have large MCA stroke. History of HTN, GERD, CAD s/p CABG, A-fib, anemia, CHF, anxiety, depression, neuropathy, COPD, spinal stenosis s/p back surgery.    PT Comments  Good initiation with cues to rise to EOB. Min assist for trunk and to scoot but brought LEs off EOB on her own today. Mod assist to stand several times from bed and recliner. Mod assist for step pivot transfer to recliner. AAROM for strengthening exercises. Able to sustain reps once initiated with help. Patient will continue to benefit from skilled physical therapy services to further improve independence with functional mobility.     If plan is discharge home, recommend the following: A lot of help with walking and/or transfers;A lot of help with bathing/dressing/bathroom;Assistance with cooking/housework;Assist for transportation;Supervision due to cognitive status;Help with stairs or ramp for entrance   Can travel by private vehicle     No  Equipment Recommendations  Wheelchair (measurements PT);Hospital bed;BSC/3in1;Wheelchair cushion (measurements PT)    Recommendations for Other Services       Precautions / Restrictions Precautions Precautions: Fall Recall of Precautions/Restrictions: Impaired Required Braces or Orthoses: Splint/Cast Splint/Cast: R resting hand splint Splint/Cast - Date Prophylactic Dressing Applied (if applicable): 02/10/24 Restrictions Weight Bearing Restrictions Per Provider Order: No     Mobility  Bed Mobility Overal bed mobility: Needs Assistance Bed Mobility: Supine to Sit   Sidelying to sit: Min assist       General bed mobility comments: Min assist to rise to EOB with support for trunk. Slow and effortful but brought LEs out of bed.     Transfers Overall transfer level: Needs assistance Equipment used: 1 person hand held assist Transfers: Sit to/from Stand, Bed to chair/wheelchair/BSC Sit to Stand: Mod assist Stand pivot transfers: Mod assist         General transfer comment: Mod assist for boost to stand x2 from bed, and x 1 from recliner. Supported via LUE. Bears weight through RLE as well but shows lateral lean towards that direction. Progressed with step pivot transfer to chair, mod assist for balance and cues for sequencing and upright posture. Attempts to sit prematurely.    Ambulation/Gait               General Gait Details: unable to at this time   Stairs             Wheelchair Mobility     Tilt Bed    Modified Rankin (Stroke Patients Only)       Balance Overall balance assessment: Needs assistance Sitting-balance support: Feet supported, Bilateral upper extremity supported Sitting balance-Leahy Scale: Fair Sitting balance - Comments: sitting EOB CGA   Standing balance support: Single extremity supported Standing balance-Leahy Scale: Poor Standing balance comment: reliant on ext support                            Communication Communication Communication: Impaired Factors Affecting Communication: Difficulty expressing self  Cognition Arousal: Alert Behavior During Therapy: Flat affect   PT - Cognitive impairments: Difficult to assess Difficult to assess due to: Impaired communication                     PT - Cognition Comments: When asked to hold up two fingers  she held up one with LUE. Following commands: Impaired Following commands impaired: Follows one step commands with increased time, Follows one step commands inconsistently (cues and increased time needed secondary to aphasia)    Cueing Cueing Techniques: Verbal cues, Tactile cues, Visual cues, Gestural cues  Exercises General Exercises - Lower Extremity Ankle Circles/Pumps: AROM, AAROM,  Both, 10 reps, Seated Quad Sets: Strengthening, Both, 10 reps, Seated Long Arc Quad: AAROM, Strengthening, Both, 10 reps, Seated Hip Flexion/Marching: AAROM, Strengthening, Both, 10 reps, Seated    General Comments General comments (skin integrity, edema, etc.): Spo2 97% on 2.5L at rest. and with activity.      Pertinent Vitals/Pain Pain Assessment Pain Assessment: CPOT Facial Expression: Relaxed, neutral Body Movements: Absence of movements Muscle Tension: Relaxed Compliance with ventilator (intubated pts.): N/A Vocalization (extubated pts.): N/A CPOT Total: 0    Home Living                          Prior Function            PT Goals (current goals can now be found in the care plan section) Acute Rehab PT Goals Patient Stated Goal: spouse goal is to regain independence to go home with assistance PT Goal Formulation: With family Time For Goal Achievement: 02/22/24 Potential to Achieve Goals: Fair Progress towards PT goals: Progressing toward goals    Frequency    Min 3X/week      PT Plan      Co-evaluation              AM-PAC PT 6 Clicks Mobility   Outcome Measure  Help needed turning from your back to your side while in a flat bed without using bedrails?: A Little Help needed moving from lying on your back to sitting on the side of a flat bed without using bedrails?: A Little Help needed moving to and from a bed to a chair (including a wheelchair)?: A Lot Help needed standing up from a chair using your arms (e.g., wheelchair or bedside chair)?: A Lot Help needed to walk in hospital room?: Total Help needed climbing 3-5 steps with a railing? : Total 6 Click Score: 12    End of Session Equipment Utilized During Treatment: Gait belt Activity Tolerance: Patient tolerated treatment well Patient left: with call bell/phone within reach;with family/visitor present;in chair;with chair alarm set Nurse Communication: Mobility status;Need for lift  equipment Octaviano Belts would be helpful, or +2 for stand pivot. Lift pad in chair.) PT Visit Diagnosis: Hemiplegia and hemiparesis;Other abnormalities of gait and mobility (R26.89) Hemiplegia - Right/Left: Right Hemiplegia - dominant/non-dominant: Dominant Hemiplegia - caused by: Cerebral infarction     Time: 9147-8295 PT Time Calculation (min) (ACUTE ONLY): 15 min  Charges:    $Therapeutic Activity: 8-22 mins PT General Charges $$ ACUTE PT VISIT: 1 Visit                     Jory Ng, PT, DPT [facility]  Rehabilitation Services Physical Therapist Office: [phone] Website: [email or web address]    Alinda Irani 02/12/2024, 5:10 PM

## 2024-02-12 NOTE — Plan of Care (Signed)

## 2024-02-12 NOTE — Plan of Care (Signed)
 Problem: Education: Goal: Knowledge of disease or condition will improve 02/12/2024 1052 by Raynaldo Call, RN Outcome: Progressing 02/12/2024 1052 by Raynaldo Call, RN Outcome: Progressing Goal: Knowledge of secondary prevention will improve (MUST DOCUMENT ALL) 02/12/2024 1052 by Raynaldo Call, RN Outcome: Progressing 02/12/2024 1052 by Raynaldo Call, RN Outcome: Progressing Goal: Knowledge of patient specific risk factors will improve (DELETE if not current risk factor) 02/12/2024 1052 by Raynaldo Call, RN Outcome: Progressing 02/12/2024 1052 by Raynaldo Call, RN Outcome: Progressing   Problem: Ischemic Stroke/TIA Tissue Perfusion: Goal: Complications of ischemic stroke/TIA will be minimized 02/12/2024 1052 by Raynaldo Call, RN Outcome: Progressing 02/12/2024 1052 by Raynaldo Call, RN Outcome: Progressing   Problem: Coping: Goal: Will verbalize positive feelings about self 02/12/2024 1052 by Raynaldo Call, RN Outcome: Progressing 02/12/2024 1052 by Raynaldo Call, RN Outcome: Progressing Goal: Will identify appropriate support needs 02/12/2024 1052 by Raynaldo Call, RN Outcome: Progressing 02/12/2024 1052 by Raynaldo Call, RN Outcome: Progressing   Problem: Health Behavior/Discharge Planning: Goal: Ability to manage health-related needs will improve 02/12/2024 1052 by Raynaldo Call, RN Outcome: Progressing 02/12/2024 1052 by Raynaldo Call, RN Outcome: Progressing Goal: Goals will be collaboratively established with patient/family 02/12/2024 1052 by Raynaldo Call, RN Outcome: Progressing 02/12/2024 1052 by Raynaldo Call, RN Outcome: Progressing   Problem: Self-Care: Goal: Ability to participate in self-care as condition permits will improve 02/12/2024 1052 by Raynaldo Call, RN Outcome: Progressing 02/12/2024 1052 by Raynaldo Call, RN Outcome: Progressing Goal: Verbalization of feelings and  concerns over difficulty with self-care will improve 02/12/2024 1052 by Raynaldo Call, RN Outcome: Progressing 02/12/2024 1052 by Raynaldo Call, RN Outcome: Progressing Goal: Ability to communicate needs accurately will improve 02/12/2024 1052 by Raynaldo Call, RN Outcome: Progressing 02/12/2024 1052 by Raynaldo Call, RN Outcome: Progressing   Problem: Nutrition: Goal: Risk of aspiration will decrease 02/12/2024 1052 by Raynaldo Call, RN Outcome: Progressing 02/12/2024 1052 by Raynaldo Call, RN Outcome: Progressing Goal: Dietary intake will improve 02/12/2024 1052 by Raynaldo Call, RN Outcome: Progressing 02/12/2024 1052 by Raynaldo Call, RN Outcome: Progressing   Problem: Education: Goal: Knowledge of General Education information will improve Description: Including pain rating scale, medication(s)/side effects and non-pharmacologic comfort measures 02/12/2024 1052 by Raynaldo Call, RN Outcome: Progressing 02/12/2024 1052 by Raynaldo Call, RN Outcome: Progressing   Problem: Health Behavior/Discharge Planning: Goal: Ability to manage health-related needs will improve 02/12/2024 1052 by Raynaldo Call, RN Outcome: Progressing 02/12/2024 1052 by Raynaldo Call, RN Outcome: Progressing   Problem: Clinical Measurements: Goal: Ability to maintain clinical measurements within normal limits will improve 02/12/2024 1052 by Raynaldo Call, RN Outcome: Progressing 02/12/2024 1052 by Raynaldo Call, RN Outcome: Progressing Goal: Will remain free from infection 02/12/2024 1052 by Raynaldo Call, RN Outcome: Progressing 02/12/2024 1052 by Raynaldo Call, RN Outcome: Progressing Goal: Diagnostic test results will improve 02/12/2024 1052 by Raynaldo Call, RN Outcome: Progressing 02/12/2024 1052 by Raynaldo Call, RN Outcome: Progressing Goal: Respiratory complications will improve 02/12/2024 1052 by Raynaldo Call, RN Outcome: Progressing 02/12/2024 1052 by Raynaldo Call, RN Outcome: Progressing Goal: Cardiovascular complication will be avoided 02/12/2024 1052 by Raynaldo Call, RN Outcome: Progressing 02/12/2024 1052 by Raynaldo Call, RN Outcome: Progressing   Problem: Activity: Goal: Risk for activity intolerance will decrease 02/12/2024 1052 by Raynaldo Call, RN Outcome: Progressing 02/12/2024 1052 by  Raynaldo Call, RN Outcome: Progressing   Problem: Nutrition: Goal: Adequate nutrition will be maintained 02/12/2024 1052 by Raynaldo Call, RN Outcome: Progressing 02/12/2024 1052 by Raynaldo Call, RN Outcome: Progressing   Problem: Coping: Goal: Level of anxiety will decrease 02/12/2024 1052 by Raynaldo Call, RN Outcome: Progressing 02/12/2024 1052 by Raynaldo Call, RN Outcome: Progressing   Problem: Elimination: Goal: Will not experience complications related to bowel motility 02/12/2024 1052 by Raynaldo Call, RN Outcome: Progressing 02/12/2024 1052 by Raynaldo Call, RN Outcome: Progressing Goal: Will not experience complications related to urinary retention 02/12/2024 1052 by Raynaldo Call, RN Outcome: Progressing 02/12/2024 1052 by Raynaldo Call, RN Outcome: Progressing   Problem: Pain Managment: Goal: General experience of comfort will improve and/or be controlled 02/12/2024 1052 by Raynaldo Call, RN Outcome: Progressing 02/12/2024 1052 by Raynaldo Call, RN Outcome: Progressing   Problem: Safety: Goal: Ability to remain free from injury will improve 02/12/2024 1052 by Raynaldo Call, RN Outcome: Progressing 02/12/2024 1052 by Raynaldo Call, RN Outcome: Progressing   Problem: Skin Integrity: Goal: Risk for impaired skin integrity will decrease 02/12/2024 1052 by Raynaldo Call, RN Outcome: Progressing 02/12/2024 1052 by Raynaldo Call, RN Outcome: Progressing

## 2024-02-13 DIAGNOSIS — I639 Cerebral infarction, unspecified: Secondary | ICD-10-CM | POA: Diagnosis not present

## 2024-02-13 MED ORDER — METOPROLOL TARTRATE 12.5 MG HALF TABLET: 12.5000 mg | ORAL_TABLET | Freq: Two times a day (BID) | ORAL | Status: DC

## 2024-02-13 MED ORDER — CARVEDILOL 3.125 MG PO TABS
3.1250 mg | ORAL_TABLET | Freq: Two times a day (BID) | ORAL | Status: DC
  Administered 2024-02-13 – 2024-02-14 (×3): 3.125 mg via ORAL
  Filled 2024-02-13 (×5): qty 1

## 2024-02-13 NOTE — Plan of Care (Signed)

## 2024-02-13 NOTE — Progress Notes (Addendum)
 PROGRESS NOTE                                                                                                                                                                                                             Patient Demographics:    Catherine Green, is a 83 y.o. female, DOB - 07-04-1941, ZOX:[id number]  Outpatient Primary MD for the patient is Donnie Galea, MD    LOS - 6  Admit date - 02/07/2024    Chief Complaint  Patient presents with   Code Stroke       Brief Narrative (HPI from H&P)       83 y.o. female with medical history significant of hypertension, hyperlipidemia, GERD, carotid artery disease, CAD status post CABG, atrial fibrillation, anemia, chronic diastolic CHF, anxiety, depression, neuropathy, COPD, spinal stenosis presened with right-sided weakness and expressive aphasia evaluation of large left MCA stroke.   Subjective:    Catherine Green today in bed, no significant events overnight as discussed with staff, as discussed with husband, her appetite is good.   Assessment  & Plan :    Acute CVA  - Patient presents with right-sided weakness R more than leg, expressive aphasia, right-sided hemineglect.  -CT L MCA infarct with left MCA hyperdense sign -CTA head and neck left ICA occluded at siphon, as well as left MCA and ACA occlusio - Workup significant for large left MCA territory infarct. - 2D echo EF 60 to 65% with grade 3 diastolic dysfunction. -Dopplers negative for DVT - Patient started on aspirin  this admission, and now evidence of a flutter on telemetry monitor so she started on Eliquis  as well . - Now with evidence of A-fib per cardiology on telemetry monitoring , on Eliquis , no indication for 30 days heart monitor as discussed with cardiology.  Dysphagia - [facility] 6/11, currently on dysphagia 1 with nectar thick, advance as tolerated. - Remains with significant aphasia, dysphagia, went for  [facility] 6/11, currently on dysphagia 1 with nectar thick will need close follow-up with SLP to see if diet can be advanced or if she will have enough oral intake, overall she has been having good oral intake with encouragement, so hoping if she has enough oral intake will avoid feeding tube.    Atrial arrhythmias A flutter,?  A-fib -She had frequent PACs, PVCs, no evidence of  paroxysmal A-fib -Neurology input appreciated, she will need 30 days monitor on discharge -She was noted to have a flutter event by cardiology team so started on Eliquis    Hypertension   -permissive hypertension, now goal less than 180/105, avoid low blood pressure given left ICA occlusion, long-term blood pressure goal is normotension - Blood pressure remains significantly elevated, so started on Norvasc , will increase dose today to 10 mg daily.  Continue with as needed hydralazine , will add low-dose Coreg    Hyperlipidemia  - Continue rosuvastatin  and Zetia  as tolerated   Carotid artery disease  CAD, - status post CABG    - Has been discontinued as she is currently on Eliquis  . - I will resume very low-dose coreg  given some bradycardia while on low-dose metoprolol .   Chronic diastolic CHF  > Last echo was in March with EF 60-65%, indeterminate diastolic function, normal RV function. Holding Lasix  as above   Anxiety - Depression  - Continue sertraline  if able   Neuropathy  - Continue gabapentin  as able   COPD   - Continue home Breo and albuterol           Condition - Extremely Guarded  Family Communication  : Cussed with husband at bedside daily.    Code Status :  Full  Consults  :  Neuro, cardiology  PUD Prophylaxis :     Procedures  :     TTE  CTA- 1. 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 2. Limited CT perfusion imaging as described above. 3. 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 4. Unchanged 50%  stenosis of the origin of the left common carotid artery and 75% stenosis of the brachiocephalic artery. 5. Partially visualized small to moderate bilateral pleural effusions. 6.  Aortic Atherosclerosis (ICD10-I70.0).   MRI - 1. Moderate sized acute left MCA territory infarct. No associated hemorrhage or significant regional mass effect. 2. Loss of normal flow void within the left ICA and MCA, consistent with occlusion as seen on prior CTA. 3. Underlying mild to moderate chronic microvascular ischemic disease.      Disposition Plan  :    Status is: Inpatient   DVT Prophylaxis  :    SCD's Start: 02/07/24 1625 apixaban  (ELIQUIS ) tablet 5 mg    Lab Results  Component Value Date   PLT 251 02/11/2024    Diet :  Diet Order             DIET - DYS 1 Room service appropriate? Yes; Fluid consistency: Nectar Thick  Diet effective now                    Inpatient Medications  Scheduled Meds:  amLODipine   10 mg Oral Daily   apixaban   5 mg Oral BID   ezetimibe   10 mg Oral Daily   feeding supplement  237 mL Oral BID BM   fluticasone  furoate-vilanterol  1 puff Inhalation Daily   gabapentin   400 mg Oral TID   rosuvastatin   40 mg Oral Daily   sertraline   150 mg Oral Daily   Continuous Infusions:   PRN Meds:.acetaminophen  **OR** acetaminophen  (TYLENOL ) oral liquid 160 mg/5 mL **OR** acetaminophen , albuterol , food thickener, hydrALAZINE , senna-docusate  Antibiotics  :    Anti-infectives (From admission, onward)    None         Objective:   Vitals:   02/13/24 0728 02/13/24 0800 02/13/24 0959 02/13/24 1127  BP: Aaron Aas)  174/55 (!) 169/83 (!) 146/41   Pulse:  62    Resp:  14    Temp: 98.2 F (36.8 C)     TempSrc: Oral   Oral  SpO2:  98%    Weight:      Height:        Wt Readings from Last 3 Encounters:  02/07/24 62.4 kg  01/28/24 60.8 kg  12/20/23 60 kg     Intake/Output Summary (Last 24 hours) at 02/13/2024 1154 Last data filed at 02/12/2024 1300 Gross per 24  hour  Intake 200 ml  Output 550 ml  Net -350 ml      Physical Exam   Awake Alert, she is with significant expressive aphasia, dense right-sided weakness Symmetrical Chest wall movement, Good air movement bilaterally, CTAB RRR,No Gallops,Rubs or new Murmurs, No Parasternal Heave +ve B.Sounds, Abd Soft, No tenderness, No rebound - guarding or rigidity. No Cyanosis, Clubbing or edema, No new Rash or bruise           Data Review:    Recent Labs  Lab 02/07/24 1419 02/07/24 1437 02/07/24 1451 02/08/24 0525 02/10/24 0723 02/11/24 0643  WBC 11.4*  --   --  7.2 7.2 7.8  HGB 12.8 13.9 14.6 13.4 11.0* 11.8*  HCT 40.8 41.0 43.0 41.9 35.6* 38.8  PLT 281  --   --  305 250 251  MCV 86.4  --   --  84.0 85.8 86.8  MCH 27.1  --   --  26.9 26.5 26.4  MCHC 31.4  --   --  32.0 30.9 30.4  RDW 13.7  --   --  14.1 13.9 13.8  LYMPHSABS 0.9  --   --   --   --   --   MONOABS 0.6  --   --   --   --   --   EOSABS 0.0  --   --   --   --   --   BASOSABS 0.0  --   --   --   --   --     Recent Labs  Lab 02/07/24 1419 02/07/24 1437 02/07/24 1451 02/07/24 1452 02/08/24 0525 02/08/24 0712 02/10/24 0723 02/11/24 0643  NA 137 139 138  --  141  --  144 144  K 4.2 4.2 3.6  --  3.9  --  3.4* 3.9  CL 104 102 102  --  102  --  111 111  CO2 24  --   --   --  25  --  23 24  ANIONGAP 9  --   --   --  14  --  10 9  GLUCOSE 142* 143* 225*  --  154*  --  92 97  BUN 15 17 16   --  16  --  16 17  CREATININE 0.93 1.00 0.90  --  1.10*  --  0.79 0.77  AST 22  --   --   --  25  --   --   --   ALT 14  --   --   --  15  --   --   --   ALKPHOS 101  --   --   --  110  --   --   --   BILITOT 0.4  --   --   --  0.7  --   --   --   ALBUMIN 4.0  --   --   --  4.0  --   --   --   LATICACIDVEN  --   --   --  1.6  --   --   --   --   INR 1.0  --   --   --   --   --   --   --   HGBA1C  --   --   --   --  5.8*  --   --   --   MG  --   --   --   --   --  2.5*  --  2.4  PHOS  --   --   --   --   --   --   --  3.3   CALCIUM  8.6*  --   --   --  9.3  --  8.7* 8.9      Recent Labs  Lab 02/07/24 1419 02/07/24 1452 02/08/24 0525 02/08/24 0712 02/10/24 0723 02/11/24 0643  LATICACIDVEN  --  1.6  --   --   --   --   INR 1.0  --   --   --   --   --   HGBA1C  --   --  5.8*  --   --   --   MG  --   --   --  2.5*  --  2.4  CALCIUM  8.6*  --  9.3  --  8.7* 8.9    --------------------------------------------------------------------------------------------------------------- Lab Results  Component Value Date   CHOL 283 (H) 02/08/2024   HDL 70 02/08/2024   LDLCALC 199 (H) 02/08/2024   LDLDIRECT 139.1 04/11/2012   TRIG 71 02/08/2024   CHOLHDL 4.0 02/08/2024    Lab Results  Component Value Date   HGBA1C 5.8 (H) 02/08/2024    Radiology Report No results found.    Signature  -   Seena Dadds M.D on 02/13/2024 at 11:54 AM   -  To page go to [email or web address]

## 2024-02-14 DIAGNOSIS — I639 Cerebral infarction, unspecified: Secondary | ICD-10-CM | POA: Diagnosis not present

## 2024-02-14 MED ORDER — FAMOTIDINE 20 MG PO TABS
20.0000 mg | ORAL_TABLET | Freq: Every day | ORAL | Status: DC
  Administered 2024-02-14 – 2024-02-18 (×4): 20 mg via ORAL
  Filled 2024-02-14 (×4): qty 1

## 2024-02-14 MED ORDER — HYDRALAZINE HCL 10 MG PO TABS
10.0000 mg | ORAL_TABLET | Freq: Four times a day (QID) | ORAL | Status: DC
  Administered 2024-02-14 – 2024-02-15 (×5): 10 mg via ORAL
  Filled 2024-02-14 (×5): qty 1

## 2024-02-14 NOTE — TOC Progression Note (Signed)
 Transition of Care [facility]) - Progression Note    Patient Details  Name: DESHANNA KAMA MRN: [id number] Date of Birth: 1941-02-23  [facility] CM/SW Contact  Jannice Mends, LCSW Phone Number: 02/14/2024, 12:35 PM  Clinical Narrative:    CSW initiated insurance process for [employer], Certification # [id number]. Provided update to facility and spouse.    Expected Discharge Plan: Skilled Nursing Facility Barriers to Discharge: Continued Medical Work up, English as a second language teacher  Expected Discharge Plan and Services In-house Referral: Clinical Social Work   Post Acute Care Choice: Skilled Nursing Facility Living arrangements for the past 2 months: Single Family Home                                       Social Determinants of Health (SDOH) Interventions SDOH Screenings   Food Insecurity: No Food Insecurity (11/15/2023)  Housing: Low Risk  (11/15/2023)  Transportation Needs: No Transportation Needs (11/15/2023)  Utilities: Not At Risk (11/15/2023)  Alcohol Screen: Low Risk  (05/25/2023)  Depression (PHQ2-9): Low Risk  (05/25/2023)  Financial Resource Strain: Low Risk  (11/12/2023)  Physical Activity: Sufficiently Active (05/25/2023)  Social Connections: Moderately Integrated (11/11/2023)  Stress: No Stress Concern Present (05/25/2023)  Tobacco Use: Medium Risk (02/07/2024)  Health Literacy: Adequate Health Literacy (05/25/2023)    Readmission Risk Interventions     No data to display

## 2024-02-14 NOTE — Progress Notes (Signed)
 PROGRESS NOTE                                                                                                                                                                                                             Patient Demographics:    Catherine Green, is a 83 y.o. female, DOB - 01/13/1941, ZOX:[id number]  Outpatient Primary MD for the patient is Catherine Galea, MD    LOS - 7  Admit date - 02/07/2024    Chief Complaint  Patient presents with   Code Stroke       Brief Narrative (HPI from H&P)       83 y.o. female with medical history significant of hypertension, hyperlipidemia, GERD, carotid artery disease, CAD status post CABG, atrial fibrillation, anemia, chronic diastolic CHF, anxiety, depression, neuropathy, COPD, spinal stenosis presened with right-sided weakness and expressive aphasia evaluation of large left MCA stroke.  She had significant dysphagia and aphasia, gradually.   Subjective:    Stepahnie Green 83 today no significant events overnight as discussed with staff, as discussed with husband, SLP, she had 2 events of bringing up her nectar thick intake.     Assessment  & Plan :    Acute CVA  - Patient presents with right-sided weakness R more than leg, expressive aphasia, right-sided hemineglect.  -CT L MCA infarct with left MCA hyperdense sign -CTA head and neck left ICA occluded at siphon, as well as left MCA and ACA occlusio - Workup significant for large left MCA territory infarct. - 2D echo EF 60 to 65% with grade 3 diastolic dysfunction. -Dopplers negative for DVT - Patient started on aspirin  this admission, and now evidence of a flutter on telemetry monitor so she started on Eliquis  as well . - Now with evidence of A-fib per cardiology on telemetry monitoring , on Eliquis , no indication for 30 days heart monitor as discussed with cardiology.  Dysphagia - MBS 6/11, currently on dysphagia 1 with  nectar thick, advance as tolerated. - Remains with significant aphasia. - She is on dysphagia 1 with nectar thick, tolerating this diet, seen by SLP today, advance to dysphagia 2, she had no significant aspiration with nectar thick, but she did bring up all the fluid she drank, which was the same this morning as well, so we will plan for esophagus with single barium.  Atrial arrhythmias A flutter,?  A-fib -She had frequent PACs, PVCs, no evidence of paroxysmal A-fib -Neurology input appreciated, she will need 30 days monitor on discharge -She was noted to have a flutter event by cardiology team so started on Eliquis    Hypertension   -permissive hypertension, now goal less than 180/105, avoid low blood pressure given left ICA occlusion, long-term blood pressure goal is normotension -No target is to normalize blood pressure, on Norvasc , low-dose Coreg  (will hold on increasing due to heart rate on the lower side) and will add low-dose hydralazine  this morning.   Hyperlipidemia  - Continue rosuvastatin  and Zetia  as tolerated   Carotid artery disease  CAD, - status post CABG    - Has been discontinued as she is currently on Eliquis  . - I will resume very low-dose coreg  given some bradycardia while on low-dose metoprolol .   Chronic diastolic CHF  > Last echo was in March with EF 60-65%, indeterminate diastolic function, normal RV function. Holding Lasix  as above   Anxiety - Depression  - Continue sertraline  if able   Neuropathy  - Continue gabapentin  as able   COPD   - Continue home Breo and albuterol           Condition - Extremely Guarded  Family Communication  : disussed with husband at bedside daily.    Code Status :  Full  Consults  :  Neuro, cardiology  PUD Prophylaxis :     Procedures  :     TTE  CTA- 1. 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 2. Limited CT perfusion imaging as described above. 3. Unchanged fusiform aneurysmal dilatation  of the distal right common carotid artery status post endarterectomy. 4. Unchanged 50% stenosis of the origin of the left common carotid artery and 75% stenosis of the brachiocephalic artery. 5. Partially visualized small to moderate bilateral pleural effusions. 6.  Aortic Atherosclerosis (ICD10-I70.0).   MRI - 1. Moderate sized acute left MCA territory infarct. No associated hemorrhage or significant regional mass effect. 2. Loss of normal flow void within the left ICA and MCA, consistent with occlusion as seen on prior CTA. 3. Underlying mild to moderate chronic microvascular ischemic disease.      Disposition Plan  :    Status is: Inpatient   DVT Prophylaxis  :    SCD's Start: 02/07/24 1625 apixaban  (ELIQUIS ) tablet 5 mg    Lab Results  Component Value Date   PLT 251 02/11/2024    Diet :  Diet Order             DIET DYS 2 Room service appropriate? Yes; Fluid consistency: Nectar Thick  Diet effective now                    Inpatient Medications  Scheduled Meds:  amLODipine   10 mg Oral Daily   apixaban   5 mg Oral BID   carvedilol   3.125 mg Oral BID WC   ezetimibe   10 mg Oral Daily   feeding supplement  237 mL Oral BID BM   fluticasone  furoate-vilanterol  1 puff Inhalation Daily   gabapentin   400 mg Oral TID   rosuvastatin   40 mg Oral Daily   sertraline   150 mg Oral Daily   Continuous Infusions:   PRN Meds:.acetaminophen  **OR** acetaminophen  (TYLENOL ) oral liquid 160 mg/5 mL **OR** acetaminophen , albuterol , food thickener, hydrALAZINE , senna-docusate  Antibiotics  :    Anti-infectives (From admission, onward)    None  Objective:   Vitals:   02/14/24 0000 02/14/24 0400 02/14/24 0746 02/14/24 1156  BP: (!) 141/42 (!) 153/52 (!) 163/78 (!) 144/53  Pulse: 61 67 68 61  Resp: 13 14 11 14   Temp: 98 F (36.7 C) 98.4 F (36.9 C) 97.6 F (36.4 C) 97.9 F (36.6 C)  TempSrc: Oral  Oral Oral  SpO2: 98% 97% 98% 97%  Weight:      Height:         Wt Readings from Last 3 Encounters:  02/07/24 62.4 kg  01/28/24 60.8 kg  12/20/23 60 kg     Intake/Output Summary (Last 24 hours) at 02/14/2024 1215 Last data filed at 02/14/2024 1108 Gross per 24 hour  Intake --  Output 1050 ml  Net -1050 ml      Physical Exam   Awake Alert, she is with significant expressive aphasia, dense right-sided weakness Symmetrical Chest wall movement, Good air movement bilaterally, CTAB RRR,No Gallops,Rubs or new Murmurs, No Parasternal Heave +ve B.Sounds, Abd Soft, No tenderness, No rebound - guarding or rigidity. No Cyanosis, Clubbing or edema, No new Rash or bruise           Data Review:    Recent Labs  Lab 02/07/24 1419 02/07/24 1437 02/07/24 1451 02/08/24 0525 02/10/24 0723 02/11/24 0643  WBC 11.4*  --   --  7.2 7.2 7.8  HGB 12.8 13.9 14.6 13.4 11.0* 11.8*  HCT 40.8 41.0 43.0 41.9 35.6* 38.8  PLT 281  --   --  305 250 251  MCV 86.4  --   --  84.0 85.8 86.8  MCH 27.1  --   --  26.9 26.5 26.4  MCHC 31.4  --   --  32.0 30.9 30.4  RDW 13.7  --   --  14.1 13.9 13.8  LYMPHSABS 0.9  --   --   --   --   --   MONOABS 0.6  --   --   --   --   --   EOSABS 0.0  --   --   --   --   --   BASOSABS 0.0  --   --   --   --   --     Recent Labs  Lab 02/07/24 1419 02/07/24 1437 02/07/24 1451 02/07/24 1452 02/08/24 0525 02/08/24 0712 02/10/24 0723 02/11/24 0643  NA 137 139 138  --  141  --  144 144  K 4.2 4.2 3.6  --  3.9  --  3.4* 3.9  CL 104 102 102  --  102  --  111 111  CO2 24  --   --   --  25  --  23 24  ANIONGAP 9  --   --   --  14  --  10 9  GLUCOSE 142* 143* 225*  --  154*  --  92 97  BUN 15 17 16   --  16  --  16 17  CREATININE 0.93 1.00 0.90  --  1.10*  --  0.79 0.77  AST 22  --   --   --  25  --   --   --   ALT 14  --   --   --  15  --   --   --   ALKPHOS 101  --   --   --  110  --   --   --   BILITOT 0.4  --   --   --  0.7  --   --   --   ALBUMIN 4.0  --   --   --  4.0  --   --   --   LATICACIDVEN  --   --   --   1.6  --   --   --   --   INR 1.0  --   --   --   --   --   --   --   HGBA1C  --   --   --   --  5.8*  --   --   --   MG  --   --   --   --   --  2.5*  --  2.4  PHOS  --   --   --   --   --   --   --  3.3  CALCIUM  8.6*  --   --   --  9.3  --  8.7* 8.9      Recent Labs  Lab 02/07/24 1419 02/07/24 1452 02/08/24 0525 02/08/24 0712 02/10/24 0723 02/11/24 0643  LATICACIDVEN  --  1.6  --   --   --   --   INR 1.0  --   --   --   --   --   HGBA1C  --   --  5.8*  --   --   --   MG  --   --   --  2.5*  --  2.4  CALCIUM  8.6*  --  9.3  --  8.7* 8.9    --------------------------------------------------------------------------------------------------------------- Lab Results  Component Value Date   CHOL 283 (H) 02/08/2024   HDL 70 02/08/2024   LDLCALC 199 (H) 02/08/2024   LDLDIRECT 139.1 04/11/2012   TRIG 71 02/08/2024   CHOLHDL 4.0 02/08/2024    Lab Results  Component Value Date   HGBA1C 5.8 (H) 02/08/2024    Radiology Report No results found.    Signature  -   Seena Dadds M.D on 02/14/2024 at 12:15 PM   -  To page go to [email or web address]

## 2024-02-14 NOTE — Progress Notes (Signed)
 Speech Language Pathology Treatment: Dysphagia  Patient Details Name: Catherine Green MRN: [id number] DOB: Dec 08, 1940 Today's Date: 02/14/2024 Time: 1050-1130 SLP Time Calculation (min) (ACUTE ONLY): 40 min  Assessment / Plan / Recommendation Clinical Impression  Skilled therapy session focused on dysphagia goals. MD and husband reporting improvements in alertness and participation during mealtimes this date. SLP observed trials of thin liquids via tsp/cup, nectar thick liquids via tsp/cup, puree and D2 solids. Patient with increased swallow initiation this date compared to prior and complete oral clearance with all consistencies tested. Patient with audible swallows throughout and x1 instance of possible aspiration characterized by immediate cough and subsequent emesis after thin liquids via cup. Husband then reporting consistent incident this AM with cup sips of NTL. Recommend upgrade to D2 solids with NTL at this time with plans for repeat MBS as able.   Patient continues to present with significant aphasia characterized by requiring max visual cues for single step commands and limited nonsensical verbal output.     HPI HPI: Patient is an 83 year old female admitted to [facility] with acute onset right-sided weakness.  Patient found to have left MCA CVA evolving.  Swallow and speech eval ordered.  Patient resides with her spouse and was using a walker prior to admission due to back issues per staff.  PT has prior h/o dysphagia after ACDF - that resolved per chart review.  She underwent BSE 02/08/2024 and requires MBS due to CVA, motor planning deficits negating her ability to cough on command and s/s of aspiration.  SLP follow up for dysphagia/dysphasia indicated.      SLP Plan  Continue with current plan of care          Recommendations  Diet recommendations: Nectar-thick liquid;Dysphagia 2 (fine chop) Liquids provided via: Teaspoon Medication Administration: Crushed with  puree Supervision: Full supervision/cueing for compensatory strategies;Patient able to self feed Compensations: Slow rate;Small sips/bites;Other (Comment);Minimize environmental distractions Postural Changes and/or Swallow Maneuvers: Seated upright 90 degrees;Upright 30-60 min after meal                  Oral care prior to ice chip/H20;Oral care QID   Frequent or constant Supervision/Assistance Dysphagia, oropharyngeal phase (R13.12)     Continue with current plan of care     Shameka Aggarwal M.A., CCC-SLP 02/14/2024, 12:01 PM

## 2024-02-14 NOTE — Progress Notes (Addendum)
 Physical Therapy Treatment Patient Details Name: Catherine Green MRN: [id number] DOB: 1941-06-07 Today's Date: 02/14/2024   History of Present Illness Patient is a 83 year old female with right side weakness, expressive aphasia, found to have large MCA stroke. History of HTN, GERD, CAD s/p CABG, A-fib, anemia, CHF, anxiety, depression, neuropathy, COPD, spinal stenosis s/p back surgery.    PT Comments  Pt received in bed, was coughing some and husband relayed that she had gotten choked on her milk with breakfast. Pt able to come to EOB with min A, maintaining balance EOB with CGA and mild R lean. Pt worked on sit to stand within stedy and then with HHA from recliner without AD. Pt needed mod A from low surfaces and min A from higher surfaces. Pt had some dizziness in sitting with SBP dropping from 140 to 114, returned to 136 with time up. Gait training initiated with manual facilitation of RLE step and support given at R knee during L step. Pt needed mod A +2 to accomplish this. Continue to feel that pt would be a good candidate for CIR but if husband not in agreement with this recommend continued inpatient PT <3 hours/ day. PT will continue to follow.     If plan is discharge home, recommend the following: A lot of help with walking and/or transfers;A lot of help with bathing/dressing/bathroom;Assistance with cooking/housework;Assist for transportation;Supervision due to cognitive status;Help with stairs or ramp for entrance   Can travel by private vehicle     No  Equipment Recommendations  Wheelchair (measurements PT);Hospital bed;BSC/3in1;Wheelchair cushion (measurements PT)    Recommendations for Other Services Rehab consult     Precautions / Restrictions Precautions Precautions: Fall Recall of Precautions/Restrictions: Impaired Required Braces or Orthoses: Splint/Cast Splint/Cast: R resting hand splint Splint/Cast - Date Prophylactic Dressing Applied (if applicable):  02/10/24 Restrictions Weight Bearing Restrictions Per Provider Order: No     Mobility  Bed Mobility Overal bed mobility: Needs Assistance Bed Mobility: Supine to Sit Rolling: Mod assist, Used rails, Min assist (min to R, mod to L) Sidelying to sit: Min assist       General bed mobility comments: Min assist to rise to EOB with support for trunk. Slow and effortful but brought LEs out of bed.    Transfers Overall transfer level: Needs assistance Equipment used: 1 person hand held assist, Ambulation equipment used Transfers: Sit to/from Stand, Bed to chair/wheelchair/BSC Sit to Stand: Mod assist, +2 safety/equipment, Min assist           General transfer comment: worked on sit>stand with stedy first for extra support, then with HHA from recliner. Mod A from low surfaces, min to CGA from elevated surfaces. Pt unable to lift RUE to grasp stedy but once hand assisted to bar was able to grasp partially with fingers Transfer via Lift Equipment: Stedy  Ambulation/Gait Ambulation/Gait assistance: Mod assist, +2 physical assistance Gait Distance (Feet): 3 Feet Assistive device: 2 person hand held assist Gait Pattern/deviations: Step-to pattern, Decreased weight shift to right, Decreased step length - right, Decreased stance time - right Gait velocity: decreased Gait velocity interpretation: <1.31 ft/sec, indicative of household ambulator   General Gait Details: pt needed manual facilitation of R step and assist for knee control on R with L step but pt showed good motivation and completed 3 steps fwd and 3 steps bkwd.   Stairs             Wheelchair Mobility     Tilt Bed  Modified Rankin (Stroke Patients Only) Modified Rankin (Stroke Patients Only) Pre-Morbid Rankin Score: No symptoms Modified Rankin: Moderately severe disability     Balance Overall balance assessment: Needs assistance Sitting-balance support: Feet supported, Bilateral upper extremity  supported Sitting balance-Leahy Scale: Fair Sitting balance - Comments: sitting EOB CGA, no pushing today Postural control: Right lateral lean Standing balance support: Single extremity supported Standing balance-Leahy Scale: Poor Standing balance comment: reliant on ext support                            Communication Communication Communication: Impaired Factors Affecting Communication: Difficulty expressing self  Cognition Arousal: Alert Behavior During Therapy: Flat affect   PT - Cognitive impairments: Difficult to assess Difficult to assess due to: Impaired communication                     PT - Cognition Comments: follows basic 1 step commands 80% of time. Attempting some verbalization today Following commands: Impaired Following commands impaired: Follows one step commands with increased time, Follows one step commands inconsistently (cues and increased time needed secondary to aphasia)    Cueing Cueing Techniques: Verbal cues, Tactile cues, Visual cues, Gestural cues  Exercises General Exercises - Lower Extremity Long Arc Quad: AAROM, Strengthening, 10 reps, Seated, Right    General Comments General comments (skin integrity, edema, etc.): pt nodded yes to feeling dizzy in sitting. SBP dropped from 140 to 114 but returned to 136 with time up and pt nodded that symptoms had subsided      Pertinent Vitals/Pain Pain Assessment Pain Assessment: Faces Faces Pain Scale: Hurts a little bit Pain Location: R knee with passive extension stretch Pain Descriptors / Indicators: Grimacing Pain Intervention(s): Limited activity within patient's tolerance, Monitored during session    Home Living                          Prior Function            PT Goals (current goals can now be found in the care plan section) Acute Rehab PT Goals Patient Stated Goal: spouse goal is to regain independence to go home with assistance PT Goal Formulation: With  family Time For Goal Achievement: 02/22/24 Potential to Achieve Goals: Fair Progress towards PT goals: Progressing toward goals    Frequency    Min 3X/week      PT Plan      Co-evaluation              AM-PAC PT 6 Clicks Mobility   Outcome Measure  Help needed turning from your back to your side while in a flat bed without using bedrails?: A Little Help needed moving from lying on your back to sitting on the side of a flat bed without using bedrails?: A Little Help needed moving to and from a bed to a chair (including a wheelchair)?: A Lot Help needed standing up from a chair using your arms (e.g., wheelchair or bedside chair)?: A Lot Help needed to walk in hospital room?: Total Help needed climbing 3-5 steps with a railing? : Total 6 Click Score: 12    End of Session Equipment Utilized During Treatment: Gait belt Activity Tolerance: Patient tolerated treatment well Patient left: with call bell/phone within reach;with family/visitor present;in chair;with chair alarm set Nurse Communication: Mobility status;Need for lift equipment Octaviano Belts would be helpful, or +2 for stand pivot. Lift pad in chair.) PT  Visit Diagnosis: Hemiplegia and hemiparesis;Other abnormalities of gait and mobility (R26.89) Hemiplegia - Right/Left: Right Hemiplegia - dominant/non-dominant: Dominant Hemiplegia - caused by: Cerebral infarction     Time: 4098-1191 PT Time Calculation (min) (ACUTE ONLY): 37 min  Charges:    $Gait Training: 8-22 mins $Therapeutic Activity: 8-22 mins PT General Charges $$ ACUTE PT VISIT: 1 Visit                     Amey Ka, PT  Acute Rehab Services Secure chat preferred Office [phone]    Deloris Fetters Anvita Hirata 02/14/2024, 12:15 PM

## 2024-02-15 ENCOUNTER — Inpatient Hospital Stay (HOSPITAL_COMMUNITY)

## 2024-02-15 DIAGNOSIS — I639 Cerebral infarction, unspecified: Secondary | ICD-10-CM | POA: Diagnosis not present

## 2024-02-15 LAB — BASIC METABOLIC PANEL WITH GFR
Anion gap: 12 (ref 5–15)
BUN: 21 mg/dL (ref 8–23)
CO2: 21 mmol/L — ABNORMAL LOW (ref 22–32)
Calcium: 8.7 mg/dL — ABNORMAL LOW (ref 8.9–10.3)
Chloride: 109 mmol/L (ref 98–111)
Creatinine, Ser: 0.78 mg/dL (ref 0.44–1.00)
GFR, Estimated: >60 mL/min (ref >60)
Glucose, Bld: 87 mg/dL (ref 70–99)
Potassium: 5.3 mmol/L — ABNORMAL HIGH (ref 3.5–5.1)
Sodium: 142 mmol/L (ref 135–145)

## 2024-02-15 LAB — CBC
HCT: 38.1 % (ref 36.0–46.0)
Hemoglobin: 11.8 g/dL — ABNORMAL LOW (ref 12.0–15.0)
MCH: 27 pg (ref 26.0–34.0)
MCHC: 31 g/dL (ref 30.0–36.0)
MCV: 87.2 fL (ref 80.0–100.0)
Platelets: 288 10*3/uL (ref 150–400)
RBC: 4.37 MIL/uL (ref 3.87–5.11)
RDW: 13.6 % (ref 11.5–15.5)
WBC: 9.5 10*3/uL (ref 4.0–10.5)
nRBC: 0 % (ref 0.0–0.2)

## 2024-02-15 MED ORDER — MAGNESIUM CITRATE PO SOLN
0.5000 | Freq: Once | ORAL | Status: AC
  Administered 2024-02-15: 0.5 via ORAL
  Filled 2024-02-15: qty 296

## 2024-02-15 MED ORDER — ENOXAPARIN SODIUM 60 MG/0.6ML IJ SOSY
60.0000 mg | PREFILLED_SYRINGE | Freq: Two times a day (BID) | INTRAMUSCULAR | Status: DC
  Administered 2024-02-15: 60 mg via SUBCUTANEOUS
  Filled 2024-02-15 (×3): qty 0.6

## 2024-02-15 MED ORDER — BISACODYL 5 MG PO TBEC
10.0000 mg | DELAYED_RELEASE_TABLET | Freq: Once | ORAL | Status: AC
  Administered 2024-02-15: 10 mg via ORAL
  Filled 2024-02-15: qty 2

## 2024-02-15 MED ORDER — HYDRALAZINE HCL 25 MG PO TABS
25.0000 mg | ORAL_TABLET | Freq: Four times a day (QID) | ORAL | Status: DC
  Administered 2024-02-15 – 2024-02-18 (×9): 25 mg via ORAL
  Filled 2024-02-15 (×12): qty 1

## 2024-02-15 MED ORDER — BISACODYL 10 MG RE SUPP: 10.0000 mg | Freq: Once | RECTAL | Status: DC

## 2024-02-15 MED ORDER — SODIUM ZIRCONIUM CYCLOSILICATE 10 G PO PACK
10.0000 g | PACK | Freq: Once | ORAL | Status: DC
  Filled 2024-02-15: qty 1

## 2024-02-15 NOTE — Procedures (Addendum)
 Modified Barium Swallow Study  Patient Details  Name: Catherine Green MRN: [id number] Date of Birth: 1940-12-25  Today's Date: 02/15/2024  Modified Barium Swallow completed.  Full report located under Chart Review in the Imaging Section.  History of Present Illness Patient is an 83 year old female admitted to [facility] with acute onset right-sided weakness.  Patient found to have left MCA CVA evolving.  Swallow and speech eval ordered.  Patient resides with her spouse and was using a walker prior to admission due to back issues per staff.  PT has prior h/o dysphagia after ACDF - that resolved per chart review.  She underwent MBS on 6/11 with no aspiration present. Patient with increased s/sx of aspiration on yesterdays (6/16) PO trials with episode of emesis.   Clinical Impression Patient presents with severe oropharyngeal dysphagia. Oral phase is characterized by significantly reduced lingual control/coordination and bolus preparation resulting in posterior spillage to the pyriform sinuses- occasionally into the trachea. Patient with deficits in bolus preparation resulting in prolonged mastication of D2 solids and oral residuals cleared through HTL wash. Pharyngeal phase is remarkable for reduced sensation and mistiming vs incomplete closure of the laryngeal vestibule and epiglottis. Patient with reduced BOT retraction resulting in BOT and vallecular residuals. These deficits resulted in silent aspiration of thin liquids before the swallow unsuccessfully cleared through cued cough and audible gross aspiration of NTL and HTL via cup. Patient with adequate airway protection during HTL via tsp, puree and D2 solids. Unable to attempt compensatory strategies as patient has global aphasia. Recommend D1/HTL via tsp with medications crushed in puree. Patient should be completely awake/alert during PO intake and receive full supervision.   DIGEST Swallow Severity Rating*  Safety: 4  Efficiency:  1  Overall Pharyngeal Swallow Severity: 3 1: mild; 2: moderate; 3: severe; 4: profound  *The Dynamic Imaging Grade of Swallowing Toxicity is standardized for the head and neck cancer population, however, demonstrates promising clinical applications across populations to standardize the clinical rating of pharyngeal swallow safety and severity.    Factors that may increase risk of adverse event in presence of aspiration Roderick Civatte & Jessy Morocco 2021): Limited mobility;Dependence for feeding and/or oral hygiene;Weak cough;Reduced cognitive function;Frail or deconditioned;Aspiration of thick, dense, and/or acidic materials;Frequent aspiration of large volumes  Swallow Evaluation Recommendations Recommendations: PO diet PO Diet Recommendation: Dysphagia 1 (Pureed);Moderately thick liquids (Level 3, honey thick) Liquid Administration via: Spoon Medication Administration: Crushed with puree Supervision: Full assist for feeding;Staff to assist with self-feeding;Full supervision/cueing for swallowing strategies Swallowing strategies  : Slow rate;Small bites/sips;Check for pocketing or oral holding;Check for anterior loss Postural changes: Position pt fully upright for meals;Stay upright 30-60 min after meals Oral care recommendations: Oral care BID (2x/day) Recommended consults: Consider dietitian consultation Caregiver Recommendations: Have oral suction available    Kier Smead M.A., CCC-SLP 02/15/2024,10:41 AM

## 2024-02-15 NOTE — Progress Notes (Signed)
 ANTICOAGULATION CONSULT NOTE  Pharmacy Consult for Eliquis >>Lovenox  Indication: atrial fibrillation  Allergies  Allergen Reactions   Lyrica  [Pregabalin ] Shortness Of Breath, Swelling and Other (See Comments)    Chest tightness, also    Hydrocodone  Other (See Comments)    Hallucinations    Ivp Dye [Iodinated Contrast Media] Hives and Itching        Lipitor [Atorvastatin] Nausea And Vomiting   Methylprednisolone  Other (See Comments)    Severe weekness    Oxycodone  Hcl Other (See Comments)    Hallucinations    Penicillin G Nausea And Vomiting   Shellfish Allergy Hives and Itching   Buspar  [Buspirone ] Other (See Comments)    Worsening mood   Clavulanic Acid Other (See Comments)    Reaction not noted   Prednisone  Other (See Comments)    Reaction not noted   Codeine Nausea And Vomiting and Other (See Comments)   Cymbalta  [Duloxetine  Hcl] Nausea Only    GI upset at 30mg    Demerol Nausea And Vomiting   Doxycycline  Nausea Only and Other (See Comments)    Weakness, sick to her stomach   Effexor  [Venlafaxine ] Nausea Only   Hydrochlorothiazide  Other (See Comments)    Not an allergy but urinary frequency and sweating at 25mg    Hydroxyzine  Other (See Comments)    Excessive sweating.    Metoprolol  Other (See Comments)    Slowed body down, per pt    Protonix  [Pantoprazole  Sodium] Nausea Only   Trazodone  And Nefazodone Other (See Comments)    Sedation     Patient Measurements: Height: 5' 2 (157.5 cm) Weight: 62.4 kg (137 lb 9.1 oz) IBW/kg (Calculated) : 50.1  Vital Signs: Temp: 98.3 F (36.8 C) (06/17 0759) Temp Source: Oral (06/17 0759) BP: 139/87 (06/17 0759) Pulse Rate: 56 (06/17 0759)  Labs: Recent Labs    02/15/24 0826  CREATININE 0.78    Estimated Creatinine Clearance: 46.3 mL/min (by C-G formula based on SCr of 0.78 mg/dL).  Medical History: Past Medical History:  Diagnosis Date   Acute hypoxic respiratory failure (HCC) 11/09/2023   Acute non-recurrent  maxillary sinusitis 10/28/2022   Allergy    hay fever   Anemia    Anxiety    Back pain    Carotid artery occlusion    Cerebrovascular disease    extracranial; occlusive   Chicken pox    Coronary artery disease    Depression    Dizziness    Dizziness    DVT (deep venous thrombosis) (HCC)    Fainting spell    GERD (gastroesophageal reflux disease)    Headache    Heart murmur    Hyperlipidemia    Hypertension    Leg pain    Mitral regurgitation    PONV (postoperative nausea and vomiting)    severe nausea and vomiting   Pre-syncope    PVD (peripheral vascular disease) (HCC)    endarterectomy by Dr. Shirley Douglas   Seasonal allergies    Shortness of breath dyspnea    wth ambulation at times   Swelling of both ankles    and abdomen; takes Lasix  when needed   Thrombophlebitis    following childbirth   Ulcer     Medications:  See MAR  Assessment: 83 yo female presented as code stroke now with new atrial fibrillation (CHA2DS2-VASc = 8).  Not on anticoagulation prior to admission. Apixaban  has been started for anticoagulation. Plan for PEG tube so bridging with Lovenox .   Hgb 11.8, plt wnl Scr<1  Plan:  Hold  apixaban  Lovenox  60mg  SQ BID F/u resume apixaban   Ivery Marking, PharmD, BCIDP, AAHIVP, CPP Infectious Disease Pharmacist 02/15/2024 10:28 AM

## 2024-02-15 NOTE — Progress Notes (Signed)
 Speech Language Pathology Treatment: Dysphagia  Patient Details Name: Catherine Green MRN: [id number] DOB: November 06, 1940 Today's Date: 02/15/2024 Time: 7829-5621 SLP Time Calculation (min) (ACUTE ONLY): 20 min  Assessment / Plan / Recommendation Clinical Impression  SLP entered room to discuss results of MBS with patients husband. SLP informed husband of patients silent aspiration of thin liquids and audible aspiration of NTL/HTL via cup. SLP educated husband on subsequent recommendations of D1/HTL via tsp with medications crushed in puree. SLP encouraged full supervision during mealtimes and importance of patient sitting upright, consuming small bites/sips. Husband verbalized understanding of recommendations and in agreeable to POC.  SLP observed patient with single, tsp bites of D1 textures. Patient with relatively timely initiation and no oral residuals. No s/sx of aspiration present. Signs updated in room to reflect current diet orders.  HPI HPI: Patient is an 83 year old female admitted to [facility] with acute onset right-sided weakness.  Patient found to have left MCA CVA evolving.  Swallow and speech eval ordered.  Patient resides with her spouse and was using a walker prior to admission due to back issues per staff.  PT has prior h/o dysphagia after ACDF - that resolved per chart review.  She underwent MBS on 6/11 with no aspiration present. Patient with increased s/sx of aspiration on yesterdays (6/16) PO trials with episode of emesis.      SLP Plan  Continue with current plan of care          Recommendations  Diet recommendations: Dysphagia 1 (puree);Honey-thick liquid Liquids provided via: Teaspoon Medication Administration: Crushed with puree Supervision: Full supervision/cueing for compensatory strategies;Staff to assist with self feeding Compensations: Slow rate;Small sips/bites;Minimize environmental distractions Postural Changes and/or Swallow Maneuvers: Seated  upright 90 degrees;Upright 30-60 min after meal           Oral care BID   Frequent or constant Supervision/Assistance Dysphagia, oropharyngeal phase (R13.12)     Continue with current plan of care     Catherine Green M.A., CCC-SLP 02/15/2024, 11:14 AM

## 2024-02-15 NOTE — Progress Notes (Signed)
 PROGRESS NOTE                                                                                                                                                                                                             Patient Demographics:    Catherine Green, is a 83 y.o. female, DOB - May 28, 1941, WJX:[id number]  Outpatient Primary MD for the patient is Donnie Galea, MD    LOS - 8  Admit date - 02/07/2024    Chief Complaint  Patient presents with   Code Stroke       Brief Narrative (HPI from H&P)       83 y.o. female with medical history significant of hypertension, hyperlipidemia, GERD, carotid artery disease, CAD status post CABG, atrial fibrillation, anemia, chronic diastolic CHF, anxiety, depression, neuropathy, COPD, spinal stenosis presened with right-sided weakness and expressive aphasia evaluation of large left MCA stroke.  She had significant dysphagia and aphasia, gradually improving, advance to dysphagia 1 with nectar thick liquid diet, has been doing okay over the weekend, she was reassessed again on Monday given significant regurgitation of fluid intake, MBS this morning was significant for silent aspiration, and her liquid has been changed to honey thick liquid, point she will need PEG tube as discussed with husband.   Subjective:    Catherine Green is getting up some fluid nectar-thick after drinking, she did MBS study today .    Assessment  & Plan :    Acute CVA  - Patient presents with right-sided weakness R more than leg, expressive aphasia, right-sided hemineglect.  -CT L MCA infarct with left MCA hyperdense sign -CTA head and neck left ICA occluded at siphon, as well as left MCA and ACA occlusio - Workup significant for large left MCA territory infarct. - 2D echo EF 60 to 65% with grade 3 diastolic dysfunction. -Dopplers negative for DVT - Patient started on aspirin  this admission, and now evidence of a  flutter on telemetry monitor so she started on Eliquis  as well . - Now with evidence of A-fib per cardiology on telemetry monitoring , on Eliquis , no indication for 30 days heart monitor as discussed with cardiology.  Dysphagia - MBS 6/11, currently on dysphagia 1 with nectar thick, advance as tolerated. - Remains with significant aphasia. - She is on dysphagia 1 with nectar thick, had another  MBS study yesterday, does appear to be having silent aspirations on this diet, so she was downgraded to dysphagia 1 with thick liquid. - Discussed with husband, patient is regressing in her diet, and will need to proceed with PEG to ensure enough oral intake and low enough time for diet advancement, agreeable, IR consulted for PEG placement. - Will change Eliquis  for Lovenox  pending PEG  Atrial arrhythmias A flutter,?  A-fib -She had frequent PACs, PVCs, no evidence of paroxysmal A-fib -Neurology input appreciated, she will need 30 days monitor on discharge -She was noted to have a flutter event by cardiology team so started on Eliquis    Hypertension   -permissive hypertension initially. - Currently on Norvasc , metoprolol  and hydralazine , she is bradycardic so we will DC metoprolol  and increase hydralazine .   Hyperlipidemia  - Continue rosuvastatin  and Zetia  as tolerated   Carotid artery disease  CAD, - status post CABG    - Aspirin  and Plavix has been discontinued as she is currently on Eliquis  . - Will DC Coreg  given bradycardial.   Chronic diastolic CHF  > Last echo was in March with EF 60-65%, indeterminate diastolic function, normal RV function. Holding Lasix  as above   Anxiety - Depression  - Continue sertraline  if able   Neuropathy  - Continue gabapentin  as able   COPD   - Continue home Breo and albuterol           Condition - Extremely Guarded  Family Communication  : disussed with husband at bedside daily.    Code Status :  Full  Consults  :  Neuro, cardiology  PUD  Prophylaxis :     Procedures  :     TTE  CTA- 1. 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 2. Limited CT perfusion imaging as described above. 3. 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 4. Unchanged 50% stenosis of the origin of the left common carotid artery and 75% stenosis of the brachiocephalic artery. 5. Partially visualized small to moderate bilateral pleural effusions. 6.  Aortic Atherosclerosis (ICD10-I70.0).   MRI - 1. Moderate sized acute left MCA territory infarct. No associated hemorrhage or significant regional mass effect. 2. Loss of normal flow void within the left ICA and MCA, consistent with occlusion as seen on prior CTA. 3. Underlying mild to moderate chronic microvascular ischemic disease.      Disposition Plan  :    Status is: Inpatient   DVT Prophylaxis  :    SCD's Start: 02/07/24 1625    Lab Results  Component Value Date   PLT 288 02/15/2024    Diet :  Diet Order             DIET - DYS 1 Fluid consistency: Honey Thick  Diet effective now                    Inpatient Medications  Scheduled Meds:  amLODipine   10 mg Oral Daily   carvedilol   3.125 mg Oral BID WC   enoxaparin  (LOVENOX ) injection  60 mg Subcutaneous BID   ezetimibe   10 mg Oral Daily   famotidine   20 mg Oral Daily   feeding supplement  237 mL Oral BID BM   fluticasone  furoate-vilanterol  1 puff Inhalation Daily   gabapentin   400 mg Oral TID   hydrALAZINE   10 mg Oral Q6H   rosuvastatin   40 mg Oral Daily   sertraline   150 mg Oral Daily   sodium zirconium cyclosilicate  10 g Oral Once   Continuous Infusions:   PRN Meds:.acetaminophen  **OR** acetaminophen  (TYLENOL ) oral liquid 160 mg/5 mL **OR** acetaminophen , albuterol , food thickener, hydrALAZINE , senna-docusate  Antibiotics  :    Anti-infectives (From admission, onward)    None         Objective:   Vitals:    02/15/24 0533 02/15/24 0759 02/15/24 1149 02/15/24 1150  BP: (!) 151/64 139/87 (!) 150/59 (!) 138/47  Pulse: (!) 54 (!) 56  (!) 56  Resp: 13 13  15   Temp:  98.3 F (36.8 C)  98.2 F (36.8 C)  TempSrc:  Oral  Axillary  SpO2: 93% 94%  95%  Weight:      Height:        Wt Readings from Last 3 Encounters:  02/07/24 62.4 kg  01/28/24 60.8 kg  12/20/23 60 kg    No intake or output data in the 24 hours ending 02/15/24 1229     Physical Exam   Awake Alert, she is with significant expressive aphasia, dense right-sided weakness Symmetrical Chest wall movement, Good air movement bilaterally, CTAB RRR,No Gallops,Rubs or new Murmurs, No Parasternal Heave +ve B.Sounds, Abd Soft, No tenderness, No rebound - guarding or rigidity. No Cyanosis, Clubbing or edema, No new Rash or bruise            Data Review:    Recent Labs  Lab 02/10/24 0723 02/11/24 0643 02/15/24 0955  WBC 7.2 7.8 9.5  HGB 11.0* 11.8* 11.8*  HCT 35.6* 38.8 38.1  PLT 250 251 288  MCV 85.8 86.8 87.2  MCH 26.5 26.4 27.0  MCHC 30.9 30.4 31.0  RDW 13.9 13.8 13.6    Recent Labs  Lab 02/10/24 0723 02/11/24 0643 02/15/24 0826  NA 144 144 142  K 3.4* 3.9 5.3*  CL 111 111 109  CO2 23 24 21*  ANIONGAP 10 9 12   GLUCOSE 92 97 87  BUN 16 17 21   CREATININE 0.79 0.77 0.78  MG  --  2.4  --   PHOS  --  3.3  --   CALCIUM  8.7* 8.9 8.7*      Recent Labs  Lab 02/10/24 0723 02/11/24 0643 02/15/24 0826  MG  --  2.4  --   CALCIUM  8.7* 8.9 8.7*    --------------------------------------------------------------------------------------------------------------- Lab Results  Component Value Date   CHOL 283 (H) 02/08/2024   HDL 70 02/08/2024   LDLCALC 199 (H) 02/08/2024   LDLDIRECT 139.1 04/11/2012   TRIG 71 02/08/2024   CHOLHDL 4.0 02/08/2024    Lab Results  Component Value Date   HGBA1C 5.8 (H) 02/08/2024    Radiology Report DG Swallowing Func-Speech Pathology Result Date: 02/15/2024 Table  formatting from the original result was not included. Modified Barium Swallow Study Patient Details Name: VIENO TARRANT MRN: [id number] Date of Birth: [birth year] Today's Date: 02/15/2024 HPI/PMH: HPI: Patient is an 83 year old female admitted [facility] with acute onset right-sided weakness.  Patient found to have left MCA CVA evolving.  Swallow and speech eval ordered.  Patient resides with her spouse and was using a walker prior to admission due to back issues per staff.  PT has prior h/o dysphagia after ACDF - that resolved per chart review.  She underwent MBS on 6/11 with no aspiration present. Patient with increased s/sx of aspiration on yesterdays (6/16) PO trials with episode of emesis. Clinical Impression: Patient presents with severe oropharyngeal dysphagia. Oral phase is  characterized by significantly reduced lingual control/coordination and bolus preparation resulting in posterior spillage to the pyriform sinuses- occasionally into the trachea. Patient with deficits in bolus preparation resulting in prolonged mastication of D2 solids and oral residuals cleared through HTL wash. Pharyngeal phase is remarkable for reduced sensation and mistiming vs incomplete closure of the laryngeal vestibule and epiglottis. Patient with reduced BOT retraction resulting in BOT and vallecular residuals. These deficits resulted in silent aspiration of thin liquids unsuccessfully cleared through cued cough and audible gross aspiration of NTL and HTL via cup. Patient with adequate airway protection during HTL via tsp, puree and D2 solids. Unable to attempt compensatory strategies as patient has global aphasia. Recommend D1/HTL via tsp with medications crushed in puree. Patient should be completely awake/alert during PO intake and receive full supervision. DIGEST Swallow Severity Rating*  Safety: 4  Efficiency: 1  Overall Pharyngeal Swallow Severity: 3 1: mild; 2: moderate; 3: severe; 4: profound *The Dynamic Imaging  Grade of Swallowing Toxicity is standardized for the head and neck cancer population, however, demonstrates promising clinical applications across populations to standardize the clinical rating of pharyngeal swallow safety and severity. Factors that may increase risk of adverse event in presence of aspiration Roderick Civatte & Jessy Morocco 2021): Factors that may increase risk of adverse event in presence of aspiration Roderick Civatte & Jessy Morocco 2021): Limited mobility; Dependence for feeding and/or oral hygiene; Weak cough; Reduced cognitive function; Frail or deconditioned; Aspiration of thick, dense, and/or acidic materials; Frequent aspiration of large volumes Recommendations/Plan: Swallowing Evaluation Recommendations Swallowing Evaluation Recommendations Recommendations: PO diet PO Diet Recommendation: Dysphagia 1 (Pureed); Moderately thick liquids (Level 3, honey thick) Liquid Administration via: Spoon Medication Administration: Crushed with puree Supervision: Full assist for feeding; Staff to assist with self-feeding; Full supervision/cueing for swallowing strategies Swallowing strategies  : Slow rate; Small bites/sips; Check for pocketing or oral holding; Check for anterior loss Postural changes: Position pt fully upright for meals; Stay upright 30-60 min after meals Oral care recommendations: Oral care BID (2x/day) Recommended consults: Consider dietitian consultation Caregiver Recommendations: Have oral suction available Treatment Plan Treatment Plan Treatment recommendations: Therapy as outlined in treatment plan below Follow-up recommendations: Skilled nursing-short term rehab (<3 hours/day) Functional status assessment: Patient has had a recent decline in their functional status and demonstrates the ability to make significant improvements in function in a reasonable and predictable amount of time. Treatment frequency: Min 2x/week Treatment duration: 2 weeks Interventions: Compensatory techniques; Aspiration precaution  training; Trials of upgraded texture/liquids; Patient/family education; Diet toleration management by SLP Recommendations Recommendations for follow up therapy are one component of a multi-disciplinary discharge planning process, led by the attending physician.  Recommendations may be updated based on patient status, additional functional criteria and insurance authorization. Assessment: Orofacial Exam: Orofacial Exam Oral Cavity: Oral Hygiene: WFL Oral Cavity - Dentition: Adequate natural dentition Oral Motor/Sensory Function: Suspected cranial nerve impairment (global aphasia) Anatomy: Anatomy: Presence of cervical hardware Boluses Administered: Boluses Administered Boluses Administered: Thin liquids (Level 0); Mildly thick liquids (Level 2, nectar thick); Moderately thick liquids (Level 3, honey thick); Puree; Solid  Oral Impairment Domain: Oral Impairment Domain Lip Closure: Interlabial escape, no progression to anterior lip Tongue control during bolus hold: Posterior escape of greater than half of bolus; Posterior escape of less than half of bolus Bolus preparation/mastication: Minimal chewing/mashing with majority of bolus unchewed Bolus transport/lingual motion: Slow tongue motion; Repetitive/disorganized tongue motion Oral residue: Residue collection on oral structures Location of oral residue : Tongue; Palate Initiation of pharyngeal swallow : Pyriform  sinuses; No visible initiation at any location  Pharyngeal Impairment Domain: Pharyngeal Impairment Domain Soft palate elevation: No bolus between soft palate (SP)/pharyngeal wall (PW) Laryngeal elevation: Partial superior movement of thyroid  cartilage/partial approximation of arytenoids to epiglottic petiole Anterior hyoid excursion: Complete anterior movement Epiglottic movement: Partial inversion Laryngeal vestibule closure: Incomplete, narrow column air/contrast in laryngeal vestibule Pharyngeal stripping wave : Present - diminished Pharyngeal contraction  (A/P view only): N/A Pharyngoesophageal segment opening: Complete distension and complete duration, no obstruction of flow Tongue base retraction: Narrow column of contrast or air between tongue base and PPW Pharyngeal residue: Trace residue within or on pharyngeal structures Location of pharyngeal residue: Tongue base; Valleculae  Esophageal Impairment Domain: Esophageal Impairment Domain Esophageal clearance upright position: -- (DNT) Pill: Pill Consistency administered: -- (DTA 2/2 aspiration risk) Penetration/Aspiration Scale Score: Penetration/Aspiration Scale Score 1.  Material does not enter airway: Puree; Solid 7.  Material enters airway, passes BELOW cords and not ejected out despite cough attempt by patient: Mildly thick liquids (Level 2, nectar thick); Moderately thick liquids (Level 3, honey thick) 8.  Material enters airway, passes BELOW cords without attempt by patient to eject out (silent aspiration) : Thin liquids (Level 0) Compensatory Strategies: Compensatory Strategies Compensatory strategies: Yes Other(comment): Ineffective (cued cough) Ineffective Other(comment): Thin liquid (Level 0)   General Information: Caregiver present: No  Diet Prior to this Study: Dysphagia 2 (finely chopped); Mildly thick liquids (Level 2, nectar thick)   No data recorded  Respiratory Status: WFL   Supplemental O2: None (Room air)   History of Recent Intubation: No  Behavior/Cognition: Alert; Doesn't follow directions Self-Feeding Abilities: Needs set-up for self-feeding Baseline vocal quality/speech: Hypophonia/low volume Volitional Cough: Able to elicit Volitional Swallow: Able to elicit No data recorded Goal Planning: Prognosis for improved oropharyngeal function: Fair Barriers to Reach Goals: Severity of deficits; Other (Comment); Language deficits No data recorded No data recorded Consulted and agree with results and recommendations: Nurse; Physician Pain: Pain Assessment Pain Assessment: Faces Faces Pain Scale: 2  Breathing: 0 Negative Vocalization: 0 Facial Expression: 0 Body Language: 0 Consolability: 0 PAINAD Score: 0 Pain Location: R knee with passive extension stretch Pain Descriptors / Indicators: Grimacing Pain Intervention(s): Limited activity within patient's tolerance; Monitored during session End of Session: Start Time:SLP Start Time (ACUTE ONLY): 0830 Stop Time: SLP Stop Time (ACUTE ONLY): 0900 Time Calculation:SLP Time Calculation (min) (ACUTE ONLY): 30 min Charges: SLP Evaluations $ SLP Speech Visit: 1 Visit SLP Evaluations $MBS Swallow: 1 Procedure $Swallowing Treatment: 1 Procedure SLP visit diagnosis: SLP Visit Diagnosis: Dysphagia, oropharyngeal phase (R13.12) Past Medical History: Past Medical History: Diagnosis Date  Acute hypoxic respiratory failure (HCC) 11/09/2023  Acute non-recurrent maxillary sinusitis 10/28/2022  Allergy   hay fever  Anemia   Anxiety   Back pain   Carotid artery occlusion   Cerebrovascular disease   extracranial; occlusive  Chicken pox   Coronary artery disease   Depression   Dizziness   Dizziness   DVT (deep venous thrombosis) (HCC)   Fainting spell   GERD (gastroesophageal reflux disease)   Headache   Heart murmur   Hyperlipidemia   Hypertension   Leg pain   Mitral regurgitation   PONV (postoperative nausea and vomiting)   severe nausea and vomiting  Pre-syncope   PVD (peripheral vascular disease) (HCC)   endarterectomy by Dr. Shirley Douglas  Seasonal allergies   Shortness of breath dyspnea   wth ambulation at times  Swelling of both ankles   and abdomen; takes Lasix  when needed  Thrombophlebitis   following childbirth  Ulcer  Past Surgical History: Past Surgical History: Procedure Laterality Date  ANTERIOR CERVICAL DECOMP/DISCECTOMY FUSION N/A 01/11/2023  Procedure: Anterior Cervical Decompression/Discectomy Fusion Cervical Five-Cervical Six - Cervical Six-Cervical Seven;  Surgeon: Agustina Aldrich, MD;  Location: [facility] OR;  Service: Neurosurgery;  Laterality: N/A;  3C  APPENDECTOMY    BREAST  SURGERY    saline implants  CARDIAC CATHETERIZATION    CAROTID ENDARTERECTOMY  1992  CAROTID ENDARTERECTOMY Right 02/13/2005  Re-do Right CE  CATARACT EXTRACTION, BILATERAL  2013  CHOLECYSTECTOMY, LAPAROSCOPIC  04/02/2014  Dr. Hortensia Ma  CORONARY ARTERY BYPASS GRAFT  2006  x3 Dr. Matt Song  ENDARTERECTOMY Left 07/20/2014  Procedure: ENDARTERECTOMY CAROTID-LEFT;  Surgeon: Mayo Speck, MD;  Location: [facility] OR;  Service: Vascular;  Laterality: Left;  EYE SURGERY Bilateral 10/2011  Cataract Left eye  SEPTOPLASTY    with bilateral inferior turbinate reductions  SPINE SURGERY  12/2015  TOTAL ABDOMINAL HYSTERECTOMY    UPPER GASTROINTESTINAL ENDOSCOPY    had polyps removed from esophagus  UPPER GI ENDOSCOPY   Cassidi Sockwell M.A., CCC-SLP 02/15/2024, 10:48 AM     Signature  -   Seena Dadds M.D on 02/15/2024 at 12:29 PM   -  To page go to [email or web address]

## 2024-02-15 NOTE — Progress Notes (Signed)
 Ok to give Lokelma 10g PO x1 for k 5.3 per Dr. Osborne Blazer.  Ivery Marking, PharmD, BCIDP, AAHIVP, CPP Infectious Disease Pharmacist 02/15/2024 10:26 AM

## 2024-02-15 NOTE — Progress Notes (Signed)
 Initial Nutrition Assessment  DOCUMENTATION CODES:   Not applicable, however at elevated risk for nutrition-related decline 2/2 honey thickened liquid modifier which increases risk of dehydration. Also at risk 2/2 presence of dysphagia impeding her ability to consume sufficient calories and protein  INTERVENTION:  When appropriate, initiate tube feeding via PEG: Jevity 1.2 at 40 ml/h (960 ml per day) Initiate at 60ml/hr and increase by 10ml/hr q8 hours until goal rate achieved FWF TID (450 ml per day  Provides 1152 kcal (77% of estimated needs), 53 gm protein (76% of estimated needs), 1224 ml free water daily (FWF + TF)  Add Magic cup TID with meals, each supplement provides 290 kcal and 9 grams of protein   Discontinue Ensure Plus High Protein d/t patient now on HTL consistency  Monitor SLP notes for diet advancement/tolerance   NUTRITION DIAGNOSIS:  Inadequate oral intake related to dysphagia as evidenced by other (comment), estimated needs (DYS1/Honey thick liquid diet).  GOAL:  Patient will meet greater than or equal to 90% of their needs  MONITOR:  PO intake, Supplement acceptance, Diet advancement, TF tolerance  REASON FOR ASSESSMENT: Rounds   ASSESSMENT:   Pt with PMH significant for: HTN, HLD, GERD, carotid artery disease, hx CABG, afib, CHF, anxiety, depression, COPD, spinal stenosis. Presented w/ R sided weakness and expressive aphasia. Found to have L MCA stroke.  6/11 MBSS: DYS1/NTL 6/16 advanced to DYS2/NTL; s/sx aspiration 6/17: MBSS: DYS1/HTL; consult for PEG 6/18: PEG tube placed  Patient with s/sx aspiration with advanced diet over the weekend. MBSS showing silent aspiration. Consult for PEG placed as she will need nutrition support in order to meet calorie, protein, and fluid needs.    Average Meal Intake 6/14: 75% x2 documented meals 6/18: 75% x1 documented meal  Patient with significant dysphagia. Husband at bedside and reports her intake was  stable and adequate prior to admission. No problems chewing or swallowing PTA.   24 Hour Recall B: waffle, scrambled egg, sausage occasionally, coffee w/ cream L: sandwich or meatloaf w/ potatoes and green beans D: protein,starch, vegetable Snacks: not usually  Discussed PEG tube placement with husband and indications for placement,  what to expect when tube feeds initiated, and s/sx of intolerance.  Intake appears relatively adequate however cannot discern without consistent documentation or calorie count. She is at risk for nutrition-related decline as evidenced by thickened liquid modifier increasing risk of dehydration and reduced PO intake.  Given that she is documented with what appears to be somewhat adequate intake and scheduled for discharge to SNF tomorrow, will calculate tube feed to meet approximately 75% of estimated needs. Can be modified at SNF based on documented intake and weight trends.   Admit Weight: 62.4kg Current Weight: 62.4kg  Per chart review, patient with stable weight since beginning of documented weight history (January 2025). Husband reports her UBW is around current body weight. Weights exactly the same x3 weight collections now. This is questionable, however will continue to monitor trend. No edema on exam. Noted with no BM since 6/11. Dulcolax suppository ordered 6/17, however has not been given. Recommend more aggressive bowel regimen.   Meds: famotidine , prednisone , IV ABX   Potassium has normalized. Lokelma  was ordered, however not given as patient NPO at the time. Of note, no BM could be a contributor to hyperkalemia.   Labs reviewed from 6/18:  K+ 5.3>4.8 (wdl) CBGs 87-97 x24 hours J8r 5.8 (01/2024)  NUTRITION - FOCUSED PHYSICAL EXAM:  Flowsheet Row Most Recent Value  Orbital  Region No depletion  Upper Arm Region Mild depletion  Thoracic and Lumbar Region No depletion  Buccal Region No depletion  Temple Region Mild depletion  Clavicle Bone  Region Moderate depletion  Clavicle and Acromion Bone Region No depletion  Scapular Bone Region Mild depletion  Dorsal Hand Moderate depletion  Patellar Region Mild depletion  Anterior Thigh Region Mild depletion  Posterior Calf Region Mild depletion  Edema (RD Assessment) None  Hair Reviewed  Eyes Reviewed  Mouth Reviewed  Skin Reviewed  Nails Reviewed    Diet Order:   Diet Order             DIET - DYS 1 Fluid consistency: Honey Thick  Diet effective now            EDUCATION NEEDS:   Education needs have been addressed  Skin:  Skin Assessment: Reviewed RN Assessment  Last BM:  6/11  Height:  Ht Readings from Last 1 Encounters:  02/07/24 5' 2 (1.575 m)   Weight:  Wt Readings from Last 1 Encounters:  02/17/24 62.4 kg   Ideal Body Weight:  50 kg  BMI:  Body mass index is 25.16 kg/m.  Estimated Nutritional Needs:   Kcal:  1500-1700 kcals  Protein:  70-85g  Fluid:  >1.5L/day  Blair Deaner MS, RD, LDN Registered Dietitian Clinical Nutrition RD Inpatient Contact Info in Amion

## 2024-02-16 DIAGNOSIS — I639 Cerebral infarction, unspecified: Secondary | ICD-10-CM | POA: Diagnosis not present

## 2024-02-16 LAB — BASIC METABOLIC PANEL WITH GFR
Anion gap: 7 (ref 5–15)
BUN: 20 mg/dL (ref 8–23)
CO2: 27 mmol/L (ref 22–32)
Calcium: 8.7 mg/dL — ABNORMAL LOW (ref 8.9–10.3)
Chloride: 106 mmol/L (ref 98–111)
Creatinine, Ser: 0.75 mg/dL (ref 0.44–1.00)
GFR, Estimated: >60 mL/min (ref >60)
Glucose, Bld: 97 mg/dL (ref 70–99)
Potassium: 4.8 mmol/L (ref 3.5–5.1)
Sodium: 140 mmol/L (ref 135–145)

## 2024-02-16 MED ORDER — ENOXAPARIN SODIUM 60 MG/0.6ML IJ SOSY: 60.0000 mg | PREFILLED_SYRINGE | Freq: Once | INTRAMUSCULAR | Status: DC

## 2024-02-16 MED ORDER — VANCOMYCIN HCL IN DEXTROSE 1-5 GM/200ML-% IV SOLN: 1000.0000 mg | INTRAVENOUS | Status: DC

## 2024-02-16 MED ORDER — DIPHENHYDRAMINE HCL 25 MG PO CAPS
50.0000 mg | ORAL_CAPSULE | Freq: Once | ORAL | Status: AC
  Administered 2024-02-17: 50 mg via ORAL
  Filled 2024-02-16: qty 2

## 2024-02-16 MED ORDER — PREDNISONE 5 MG PO TABS
50.0000 mg | ORAL_TABLET | Freq: Four times a day (QID) | ORAL | Status: AC
  Administered 2024-02-16 – 2024-02-17 (×2): 50 mg via ORAL
  Filled 2024-02-16 (×2): qty 2

## 2024-02-16 MED ORDER — VANCOMYCIN HCL IN DEXTROSE 1-5 GM/200ML-% IV SOLN
1000.0000 mg | INTRAVENOUS | Status: AC
  Administered 2024-02-17: 1000 mg via INTRAVENOUS
  Filled 2024-02-16: qty 200

## 2024-02-16 NOTE — Progress Notes (Signed)
 PROGRESS NOTE    Catherine Green  GNF:[id number] DOB: 26-Aug-1941 DOA: 02/07/2024 PCP: Donnie Galea, MD   Brief Narrative:  83 y.o. female with medical history significant of hypertension, hyperlipidemia, GERD, carotid artery disease, CAD status post CABG, atrial fibrillation, anemia, chronic diastolic CHF, anxiety, depression, neuropathy, COPD, spinal stenosis presened with right-sided weakness and expressive aphasia evaluation of large left MCA stroke.  She had significant dysphagia and aphasia, gradually improving, advance to dysphagia 1 with nectar thick liquid diet, has been doing okay over the weekend, she was reassessed again on Monday given significant regurgitation of fluid intake, MBS continues to be significant for silent aspiration, and her liquid has been changed to honey thick liquid, at this point she will need PEG tube to ensure safe route for medication and calorie intake.  Husband agreeable.  IR evaluating for possible PEG tube placement in the next 24 to 48 hours.  Assessment & Plan:   Principal Problem:   Acute CVA (cerebrovascular accident) (HCC) Active Problems:   Essential hypertension   CAD, ARTERY BYPASS GRAFT   Anxiety and depression   Hyperlipidemia   Chronic diastolic CHF (congestive heart failure) (HCC)   Carotid stenosis   Spinal stenosis of lumbar region   Neuropathy   GERD (gastroesophageal reflux disease)   Anemia   Chronic obstructive pulmonary disease with acute exacerbation (HCC)   Atrial fibrillation (HCC)   Large acute MCA territory infarct/CVA  - Patient presents with right-sided weakness R more than leg, expressive aphasia, right-sided hemineglect.  -CT L MCA infarct with left MCA hyperdense sign -CTA head and neck left ICA occluded at siphon, as well as left MCA and ACA occlusion - Workup significant for large left MCA territory infarct. - 2D echo EF 60 to 65% with grade 3 diastolic dysfunction. -Dopplers negative for DVT - Continue  Eliquis  given newly diagnosed a-fib (no indication for antiplatelets at this time)  Dysphagia - MBS 6/11, currently on dysphagia 1 with nectar thick, advance as tolerated. - Remains with significant aphasia. - She is on dysphagia 1 with nectar thick, had another MBS study yesterday, does appear to be having silent aspirations on this diet, so she was downgraded to dysphagia 1 with thick liquid. - Discussed with husband, patient is regressing in her diet, and will need to proceed with PEG to ensure enough oral intake and low enough time for diet advancement, agreeable, IR consulted for PEG placement. - Contrast allergy being pre-medicated per radiology for PEG placement, lovenox  on hold - resume eliquis  post procedure pending ok from IR.   Atrial arrhythmias A-fib/flutter -Cardiology following, appreciate insight recommendations, no need for long-term heart monitor, continue Eliquis  at discharge   Hypertension: Permissive hypertension completed - Continue amlodipine , hydralazine  -metoprolol  held given bradycardia   Hyperlipidemia  - Continue rosuvastatin  and Zetia  as tolerated   Carotid artery disease - prior CABG    - Plavix/Aspirin  has been discontinued as she is currently on Eliquis  . - Will DC Coreg  given bradycardial.   Chronic diastolic CHF  > Last echo was in March with EF 60-65%, indeterminate diastolic function, normal RV function. Holding Lasix  as above   Anxiety - Depression  - Continue sertraline  if able Neuropathy  - Continue gabapentin  as able Hyperkalemia - resolved COPD   - Continue home Breo and albuterol     DVT prophylaxis: SCD's Start: 02/07/24 1625   Code Status:   Code Status: Limited: Do not attempt resuscitation (DNR) -DNR-LIMITED -Do Not Intubate/DNI   Family Communication: Husband  at bedside  Status is: Inpatient  Dispo: The patient is from: Home              Anticipated d/c is to: SNF              Anticipated d/c date is: 24 to 48 hours pending PEG  tube placement and insurance approval              Patient currently not medically stable for discharge until PEG tube has been placed  Consultants:  Neuro, Cardio  Antimicrobials:  None  Subjective: No acute issues or events overnight -currently working with physical therapy and doing quite well  Objective: Vitals:   02/16/24 0000 02/16/24 0048 02/16/24 0449 02/16/24 0535  BP: (!) 149/49  (!) 145/51 (!) 152/44  Pulse: 61  60 65  Resp: 14  14 14   Temp:  97.9 F (36.6 C) 97.8 F (36.6 C)   TempSrc:  Axillary Axillary   SpO2: 94%  94% 95%  Weight:      Height:        Intake/Output Summary (Last 24 hours) at 02/16/2024 0717 Last data filed at 02/16/2024 0530 Gross per 24 hour  Intake --  Output 900 ml  Net -900 ml   Filed Weights   02/07/24 1400 02/07/24 1534  Weight: 62.4 kg 62.4 kg    Examination:  General exam: Appears calm and comfortable  Respiratory system: Clear to auscultation. Respiratory effort normal. Cardiovascular system: S1 & S2 heard, RRR. No JVD, murmurs, rubs, gallops or clicks. No pedal edema. Gastrointestinal system: Abdomen is nondistended, soft and nontender. No organomegaly or masses felt. Normal bowel sounds heard. Central nervous system: Alert and oriented. No focal neurological deficits. Extremities: Symmetric 5 x 5 power. Skin: No rashes, lesions or ulcers Psychiatry: Judgement and insight appear normal. Mood & affect appropriate.     Data Reviewed: I have personally reviewed following labs and imaging studies  CBC: Recent Labs  Lab 02/10/24 0723 02/11/24 0643 02/15/24 0955  WBC 7.2 7.8 9.5  HGB 11.0* 11.8* 11.8*  HCT 35.6* 38.8 38.1  MCV 85.8 86.8 87.2  PLT 250 251 288   Basic Metabolic Panel: Recent Labs  Lab 02/10/24 0723 02/11/24 0643 02/15/24 0826 02/16/24 0549  NA 144 144 142 140  K 3.4* 3.9 5.3* 4.8  CL 111 111 109 106  CO2 23 24 21* 27  GLUCOSE 92 97 87 97  BUN 16 17 21 20   CREATININE 0.79 0.77 0.78 0.75   CALCIUM  8.7* 8.9 8.7* 8.7*  MG  --  2.4  --   --   PHOS  --  3.3  --   --    GFR: Estimated Creatinine Clearance: 46.3 mL/min (by C-G formula based on SCr of 0.75 mg/dL). Liver Function Tests: No results for input(s): AST, ALT, ALKPHOS, BILITOT, PROT, ALBUMIN in the last 168 hours. No results for input(s): LIPASE, AMYLASE in the last 168 hours. No results for input(s): AMMONIA in the last 168 hours. Coagulation Profile: No results for input(s): INR, PROTIME in the last 168 hours. Cardiac Enzymes: No results for input(s): CKTOTAL, CKMB, CKMBINDEX, TROPONINI in the last 168 hours. BNP (last 3 results) No results for input(s): PROBNP in the last 8760 hours. HbA1C: No results for input(s): HGBA1C in the last 72 hours. CBG: No results for input(s): GLUCAP in the last 168 hours. Lipid Profile: No results for input(s): CHOL, HDL, LDLCALC, TRIG, CHOLHDL, LDLDIRECT in the last 72 hours. Thyroid  Function Tests: No results  for input(s): TSH, T4TOTAL, FREET4, T3FREE, THYROIDAB in the last 72 hours. Anemia Panel: No results for input(s): VITAMINB12, FOLATE, FERRITIN, TIBC, IRON, RETICCTPCT in the last 72 hours. Sepsis Labs: No results for input(s): PROCALCITON, LATICACIDVEN in the last 168 hours.  Recent Results (from the past 240 hours)  SARS Coronavirus 2 by RT PCR (hospital order, performed in [facility] hospital lab) *cepheid single result test* Anterior Nasal Swab     Status: None   Collection Time: 02/07/24  3:10 PM   Specimen: Anterior Nasal Swab  Result Value Ref Range Status   SARS Coronavirus 2 by RT PCR NEGATIVE NEGATIVE Final    Comment: Performed at [facility], [address], [place], [place] [address]         Radiology Studies: DG Swallowing Func-Speech Pathology Result Date: 02/15/2024 Table formatting from the original result was not included. Modified Barium Swallow Study Patient  Details Name: AMEENA VESEY MRN: [id number] Date of Birth: 04/23/1941 Today's Date: 02/15/2024 HPI/PMH: HPI: Patient is an 83 year old female admitted to [facility] with acute onset right-sided weakness.  Patient found to have left MCA CVA evolving.  Swallow and speech eval ordered.  Patient resides with her spouse and was using a walker prior to admission due to back issues per staff.  PT has prior h/o dysphagia after ACDF - that resolved per chart review.  She underwent MBS on 6/11 with no aspiration present. Patient with increased s/sx of aspiration on yesterdays (6/16) PO trials with episode of emesis. Clinical Impression: Patient presents with severe oropharyngeal dysphagia. Oral phase is characterized by significantly reduced lingual control/coordination and bolus preparation resulting in posterior spillage to the pyriform sinuses- occasionally into the trachea. Patient with deficits in bolus preparation resulting in prolonged mastication of D2 solids and oral residuals cleared through HTL wash. Pharyngeal phase is remarkable for reduced sensation and mistiming vs incomplete closure of the laryngeal vestibule and epiglottis. Patient with reduced BOT retraction resulting in BOT and vallecular residuals. These deficits resulted in silent aspiration of thin liquids unsuccessfully cleared through cued cough and audible gross aspiration of NTL and HTL via cup. Patient with adequate airway protection during HTL via tsp, puree and D2 solids. Unable to attempt compensatory strategies as patient has global aphasia. Recommend D1/HTL via tsp with medications crushed in puree. Patient should be completely awake/alert during PO intake and receive full supervision. DIGEST Swallow Severity Rating*  Safety: 4  Efficiency: 1  Overall Pharyngeal Swallow Severity: 3 1: mild; 2: moderate; 3: severe; 4: profound *The Dynamic Imaging Grade of Swallowing Toxicity is standardized for the head and neck cancer population,  however, demonstrates promising clinical applications across populations to standardize the clinical rating of pharyngeal swallow safety and severity. Factors that may increase risk of adverse event in presence of aspiration Roderick Civatte & Jessy Morocco 2021): Factors that may increase risk of adverse event in presence of aspiration Roderick Civatte & Jessy Morocco 2021): Limited mobility; Dependence for feeding and/or oral hygiene; Weak cough; Reduced cognitive function; Frail or deconditioned; Aspiration of thick, dense, and/or acidic materials; Frequent aspiration of large volumes Recommendations/Plan: Swallowing Evaluation Recommendations Swallowing Evaluation Recommendations Recommendations: PO diet PO Diet Recommendation: Dysphagia 1 (Pureed); Moderately thick liquids (Level 3, honey thick) Liquid Administration via: Spoon Medication Administration: Crushed with puree Supervision: Full assist for feeding; Staff to assist with self-feeding; Full supervision/cueing for swallowing strategies Swallowing strategies  : Slow rate; Small bites/sips; Check for pocketing or oral holding; Check for anterior loss Postural changes: Position pt  fully upright for meals; Stay upright 30-60 min after meals Oral care recommendations: Oral care BID (2x/day) Recommended consults: Consider dietitian consultation Caregiver Recommendations: Have oral suction available Treatment Plan Treatment Plan Treatment recommendations: Therapy as outlined in treatment plan below Follow-up recommendations: Skilled nursing-short term rehab (<3 hours/day) Functional status assessment: Patient has had a recent decline in their functional status and demonstrates the ability to make significant improvements in function in a reasonable and predictable amount of time. Treatment frequency: Min 2x/week Treatment duration: 2 weeks Interventions: Compensatory techniques; Aspiration precaution training; Trials of upgraded texture/liquids; Patient/family education; Diet toleration  management by SLP Recommendations Recommendations for follow up therapy are one component of a multi-disciplinary discharge planning process, led by the attending physician.  Recommendations may be updated based on patient status, additional functional criteria and insurance authorization. Assessment: Orofacial Exam: Orofacial Exam Oral Cavity: Oral Hygiene: WFL Oral Cavity - Dentition: Adequate natural dentition Oral Motor/Sensory Function: Suspected cranial nerve impairment (global aphasia) Anatomy: Anatomy: Presence of cervical hardware Boluses Administered: Boluses Administered Boluses Administered: Thin liquids (Level 0); Mildly thick liquids (Level 2, nectar thick); Moderately thick liquids (Level 3, honey thick); Puree; Solid  Oral Impairment Domain: Oral Impairment Domain Lip Closure: Interlabial escape, no progression to anterior lip Tongue control during bolus hold: Posterior escape of greater than half of bolus; Posterior escape of less than half of bolus Bolus preparation/mastication: Minimal chewing/mashing with majority of bolus unchewed Bolus transport/lingual motion: Slow tongue motion; Repetitive/disorganized tongue motion Oral residue: Residue collection on oral structures Location of oral residue : Tongue; Palate Initiation of pharyngeal swallow : Pyriform sinuses; No visible initiation at any location  Pharyngeal Impairment Domain: Pharyngeal Impairment Domain Soft palate elevation: No bolus between soft palate (SP)/pharyngeal wall (PW) Laryngeal elevation: Partial superior movement of thyroid  cartilage/partial approximation of arytenoids to epiglottic petiole Anterior hyoid excursion: Complete anterior movement Epiglottic movement: Partial inversion Laryngeal vestibule closure: Incomplete, narrow column air/contrast in laryngeal vestibule Pharyngeal stripping wave : Present - diminished Pharyngeal contraction (A/P view only): N/A Pharyngoesophageal segment opening: Complete distension and  complete duration, no obstruction of flow Tongue base retraction: Narrow column of contrast or air between tongue base and PPW Pharyngeal residue: Trace residue within or on pharyngeal structures Location of pharyngeal residue: Tongue base; Valleculae  Esophageal Impairment Domain: Esophageal Impairment Domain Esophageal clearance upright position: -- (DNT) Pill: Pill Consistency administered: -- (DTA 2/2 aspiration risk) Penetration/Aspiration Scale Score: Penetration/Aspiration Scale Score 1.  Material does not enter airway: Puree; Solid 7.  Material enters airway, passes BELOW cords and not ejected out despite cough attempt by patient: Mildly thick liquids (Level 2, nectar thick); Moderately thick liquids (Level 3, honey thick) 8.  Material enters airway, passes BELOW cords without attempt by patient to eject out (silent aspiration) : Thin liquids (Level 0) Compensatory Strategies: Compensatory Strategies Compensatory strategies: Yes Other(comment): Ineffective (cued cough) Ineffective Other(comment): Thin liquid (Level 0)   General Information: Caregiver present: No  Diet Prior to this Study: Dysphagia 2 (finely chopped); Mildly thick liquids (Level 2, nectar thick)   No data recorded  Respiratory Status: WFL   Supplemental O2: None (Room air)   History of Recent Intubation: No  Behavior/Cognition: Alert; Doesn't follow directions Self-Feeding Abilities: Needs set-up for self-feeding Baseline vocal quality/speech: Hypophonia/low volume Volitional Cough: Able to elicit Volitional Swallow: Able to elicit No data recorded Goal Planning: Prognosis for improved oropharyngeal function: Fair Barriers to Reach Goals: Severity of deficits; Other (Comment); Language deficits No data recorded No data recorded Consulted and  agree with results and recommendations: Nurse; Physician Pain: Pain Assessment Pain Assessment: Faces Faces Pain Scale: 2 Breathing: 0 Negative Vocalization: 0 Facial Expression: 0 Body Language: 0  Consolability: 0 PAINAD Score: 0 Pain Location: R knee with passive extension stretch Pain Descriptors / Indicators: Grimacing Pain Intervention(s): Limited activity within patient's tolerance; Monitored during session End of Session: Start Time:SLP Start Time (ACUTE ONLY): 0830 Stop Time: SLP Stop Time (ACUTE ONLY): 0900 Time Calculation:SLP Time Calculation (min) (ACUTE ONLY): 30 min Charges: SLP Evaluations $ SLP Speech Visit: 1 Visit SLP Evaluations $MBS Swallow: 1 Procedure $Swallowing Treatment: 1 Procedure SLP visit diagnosis: SLP Visit Diagnosis: Dysphagia, oropharyngeal phase (R13.12) Past Medical History: Past Medical History: Diagnosis Date  Acute hypoxic respiratory failure (HCC) 11/09/2023  Acute non-recurrent maxillary sinusitis 10/28/2022  Allergy   hay fever  Anemia   Anxiety   Back pain   Carotid artery occlusion   Cerebrovascular disease   extracranial; occlusive  Chicken pox   Coronary artery disease   Depression   Dizziness   Dizziness   DVT (deep venous thrombosis) (HCC)   Fainting spell   GERD (gastroesophageal reflux disease)   Headache   Heart murmur   Hyperlipidemia   Hypertension   Leg pain   Mitral regurgitation   PONV (postoperative nausea and vomiting)   severe nausea and vomiting  Pre-syncope   PVD (peripheral vascular disease) (HCC)   endarterectomy by Dr. Shirley Douglas  Seasonal allergies   Shortness of breath dyspnea   wth ambulation at times  Swelling of both ankles   and abdomen; takes Lasix  when needed  Thrombophlebitis   following childbirth  Ulcer  Past Surgical History: Past Surgical History: Procedure Laterality Date  ANTERIOR CERVICAL DECOMP/DISCECTOMY FUSION N/A 01/11/2023  Procedure: Anterior Cervical Decompression/Discectomy Fusion Cervical Five-Cervical Six - Cervical Six-Cervical Seven;  Surgeon: Agustina Aldrich, MD;  Location: [facility];  Service: Neurosurgery;  Laterality: N/A;  3C  APPENDECTOMY    BREAST SURGERY    saline implants  CARDIAC CATHETERIZATION    CAROTID ENDARTERECTOMY   1992  CAROTID ENDARTERECTOMY Right 02/13/2005  Re-do Right CE  CATARACT EXTRACTION, BILATERAL  2013  CHOLECYSTECTOMY, LAPAROSCOPIC  04/02/2014  Dr. Hortensia Ma  CORONARY ARTERY BYPASS GRAFT  2006  x3 Dr. Matt Song  ENDARTERECTOMY Left 07/20/2014  Procedure: ENDARTERECTOMY CAROTID-LEFT;  Surgeon: Mayo Speck, MD;  Location: [facility] OR;  Service: Vascular;  Laterality: Left;  EYE SURGERY Bilateral 10/2011  Cataract Left eye  SEPTOPLASTY    with bilateral inferior turbinate reductions  SPINE SURGERY  12/2015  TOTAL ABDOMINAL HYSTERECTOMY    UPPER GASTROINTESTINAL ENDOSCOPY    had polyps removed from esophagus  UPPER GI ENDOSCOPY   Cassidi Sockwell M.A., CCC-SLP 02/15/2024, 10:48 AM  Scheduled Meds:  amLODipine   10 mg Oral Daily   bisacodyl  10 mg Rectal Once   enoxaparin  (LOVENOX ) injection  60 mg Subcutaneous BID   ezetimibe   10 mg Oral Daily   famotidine   20 mg Oral Daily   fluticasone  furoate-vilanterol  1 puff Inhalation Daily   gabapentin   400 mg Oral TID   hydrALAZINE   25 mg Oral Q6H   rosuvastatin   40 mg Oral Daily   sertraline   150 mg Oral Daily   sodium zirconium cyclosilicate  10 g Oral Once   Continuous Infusions:   LOS: 9 days   Time spent:  Haydee Lipa, DO [facility]  If 7PM-7AM, please contact night-coverage [email or web address]  02/16/2024, 7:17 AM

## 2024-02-16 NOTE — Progress Notes (Signed)
 Speech Language Pathology Treatment: Dysphagia;Cognitive-Linguistic  Patient Details Name: Catherine Green MRN: [id number] DOB: [birth year] Today's Date: 02/16/2024 Time: 4401-0272 SLP Time Calculation (min) (ACUTE ONLY): 18 min  Assessment / Plan / Recommendation Clinical Impression  SLP conducted skilled therapy session targeting dysphagia and communication goals. SLP assessed tolerance of Dys1/HTL diet with liquids administered via teaspoon. Upon SLP entry, husband reports patient just finished lunch and indicated patient had no observable difficulty across meal. SLP administered Dys1 and HTLs via teaspoon, with patient likewise exhibiting no overt s/sx of penetration/aspiration across all boluses. Recommend continuation of current diet. During communication tasks, patient's utterance length and verbal responsiveness to stimuli is improved compared to prior sessions, though verbal output is characterized by apraxic and neologic errors. Patient approximated name and date of birth when given Catherine spoken model and counted from 1 to 5 using choral speaking technique in tandem with SLP. Patient was left in room with call bell in reach and alarm set. SLP will continue to target goals per plan of care.     HPI HPI: Patient is an 83 year old female admitted to [facility] with acute onset right-sided weakness.  Patient found to have left MCA CVA evolving.  Swallow and speech eval ordered.  Patient resides with her spouse and was using Catherine walker prior to admission due to back issues per staff.  PT has prior h/o dysphagia after ACDF - that resolved per chart review.  She underwent MBS on 6/11 with no aspiration present. Patient with increased s/sx of aspiration on yesterdays (6/16) PO trials with episode of emesis.      SLP Plan  Continue with current plan of care         Recommendations  Diet recommendations: Dysphagia 1 (puree);Honey-thick liquid Liquids provided via: Teaspoon Medication  Administration: Crushed with puree Supervision: Full supervision/cueing for compensatory strategies;Staff to assist with self feeding Compensations: Slow rate;Small sips/bites;Minimize environmental distractions Postural Changes and/or Swallow Maneuvers: Seated upright 90 degrees;Upright 30-60 min after meal                Rehab consult Oral care BID   Frequent or constant Supervision/Assistance Dysphagia, oropharyngeal phase (R13.12)     Continue with current plan of care    Catherine Green, M.Catherine., CCC-SLP  Catherine Green Catherine Green  02/16/2024, 2:50 PM

## 2024-02-16 NOTE — TOC Progression Note (Signed)
 Transition of Care [facility]) - Progression Note    Patient Details  Name: Catherine Green MRN: [id number] Date of Birth: Aug 04, 1941  Transition of Care [facility]) CM/SW Contact  Jannice Mends, LCSW Phone Number: 02/16/2024, 9:19 AM  Clinical Narrative:    Therapist, occupational for [employer] is effective until 6/23. Will continue to follow.    Expected Discharge Plan: Skilled Nursing Facility Barriers to Discharge: Continued Medical Work up, English as a second language teacher  Expected Discharge Plan and Services In-house Referral: Clinical Social Work   Post Acute Care Choice: Skilled Nursing Facility Living arrangements for the past 2 months: Single Family Home                                       Social Determinants of Health (SDOH) Interventions SDOH Screenings   Food Insecurity: No Food Insecurity (11/15/2023)  Housing: Low Risk  (11/15/2023)  Transportation Needs: No Transportation Needs (11/15/2023)  Utilities: Not At Risk (11/15/2023)  Alcohol Screen: Low Risk  (05/25/2023)  Depression (PHQ2-9): Low Risk  (05/25/2023)  Financial Resource Strain: Low Risk  (11/12/2023)  Physical Activity: Sufficiently Active (05/25/2023)  Social Connections: Moderately Integrated (11/11/2023)  Stress: No Stress Concern Present (05/25/2023)  Tobacco Use: Medium Risk (02/07/2024)  Health Literacy: Adequate Health Literacy (05/25/2023)    Readmission Risk Interventions     No data to display

## 2024-02-16 NOTE — Progress Notes (Signed)
 Plan for PEG in AM. Ok to give 1 time dose of lovenox  60mg  SQ x1 today per Dr. Rudine Cos.  Ivery Marking, PharmD, BCIDP, AAHIVP, CPP Infectious Disease Pharmacist 02/16/2024 12:34 PM

## 2024-02-16 NOTE — Consult Note (Signed)
 Chief Complaint: Patient was seen in consultation today for dysphagia-- percutaneous gastric tube placement Chief Complaint  Patient presents with   Code Stroke   at the request of Dr Osborne Blazer  Supervising Physician: Fernando Hoyer  Patient Status: [facility] - In-pt  History of Present Illness: Catherine Green is a 83 y.o. female   FULL Code status for IR Procedure 6/19 per husband CVA 02/07/24 Aphasia; dysphagia Hx HTN; CAD/CABG; Afib; CHF; COPD Assessment yesterday showing significant regurg per chart MBS this am with significant silent aspiration  Request for percutaneous gastric tube placement in IR per Dr Osborne Blazer  Dr Marne Sings has reviewed imaging--- approves procedure Planned ofr 6/19 in IR   Past Medical History:  Diagnosis Date   Acute hypoxic respiratory failure (HCC) 11/09/2023   Acute non-recurrent maxillary sinusitis 10/28/2022   Allergy    hay fever   Anemia    Anxiety    Back pain    Carotid artery occlusion    Cerebrovascular disease    extracranial; occlusive   Chicken pox    Coronary artery disease    Depression    Dizziness    Dizziness    DVT (deep venous thrombosis) (HCC)    Fainting spell    GERD (gastroesophageal reflux disease)    Headache    Heart murmur    Hyperlipidemia    Hypertension    Leg pain    Mitral regurgitation    PONV (postoperative nausea and vomiting)    severe nausea and vomiting   Pre-syncope    PVD (peripheral vascular disease) (HCC)    endarterectomy by Dr. Shirley Douglas   Seasonal allergies    Shortness of breath dyspnea    wth ambulation at times   Swelling of both ankles    and abdomen; takes Lasix  when needed   Thrombophlebitis    following childbirth   Ulcer     Past Surgical History:  Procedure Laterality Date   ANTERIOR CERVICAL DECOMP/DISCECTOMY FUSION N/A 01/11/2023   Procedure: Anterior Cervical Decompression/Discectomy Fusion Cervical Five-Cervical Six - Cervical Six-Cervical Seven;  Surgeon:  Agustina Aldrich, MD;  Location: [facility] OR;  Service: Neurosurgery;  Laterality: N/A;  3C   APPENDECTOMY     BREAST SURGERY     saline implants   CARDIAC CATHETERIZATION     CAROTID ENDARTERECTOMY  1992   CAROTID ENDARTERECTOMY Right 02/13/2005   Re-do Right CE   CATARACT EXTRACTION, BILATERAL  2013   CHOLECYSTECTOMY, LAPAROSCOPIC  04/02/2014   Dr. Hortensia Ma   CORONARY ARTERY BYPASS GRAFT  2006   x3 Dr. Matt Song   ENDARTERECTOMY Left 07/20/2014   Procedure: ENDARTERECTOMY CAROTID-LEFT;  Surgeon: Mayo Speck, MD;  Location[facility] OR;  Service: Vascular;  Laterality: Left;   EYE SURGERY Bilateral 10/2011   Cataract Left eye   SEPTOPLASTY     with bilateral inferior turbinate reductions   SPINE SURGERY  12/2015   TOTAL ABDOMINAL HYSTERECTOMY     UPPER GASTROINTESTINAL ENDOSCOPY     had polyps removed from esophagus   UPPER GI ENDOSCOPY      Allergies: Lyrica  [pregabalin ], Hydrocodone , Ivp dye [iodinated contrast media], Lipitor [atorvastatin], Methylprednisolone , Oxycodone  hcl, Penicillin g, Shellfish allergy, Buspar  [buspirone ], Clavulanic acid, Prednisone , Codeine, Cymbalta  [duloxetine  hcl], Demerol, Doxycycline , Effexor  [venlafaxine ], Hydrochlorothiazide , Hydroxyzine , Metoprolol , Protonix  [pantoprazole  sodium], and Trazodone  and nefazodone  Medications: Prior to Admission medications   Medication Sig Start Date End Date Taking? Authorizing Provider  albuterol  (VENTOLIN  HFA) 108 (90 Base) MCG/ACT inhaler Inhale 2 puffs into  the lungs every 6 (six) hours as needed for wheezing or shortness of breath. 10/26/22  Yes Donnie Galea, MD  amLODipine  (NORVASC ) 2.5 MG tablet Take 1 tablet (2.5 mg total) by mouth in the morning. 12/20/23  Yes Gollan, Timothy J, MD  aspirin  81 MG tablet Take 1 tablet (81 mg total) by mouth daily. 03/29/18  Yes Gollan, Timothy J, MD  BREO ELLIPTA  100-25 MCG/ACT AEPB Inhale 1 puff into the lungs daily.   Yes [provider]  Coenzyme Q10 (CO Q10) 100 MG CAPS  Take 100 mg by mouth daily.   Yes [provider]  empagliflozin  (JARDIANCE ) 10 MG TABS tablet Take 1 tablet (10 mg total) by mouth daily before breakfast. 12/10/23  Yes Shawnee Dellen A, FNP  ezetimibe  (ZETIA ) 10 MG tablet Take 1 tablet (10 mg total) by mouth daily. 12/20/23  Yes Gollan, Timothy J, MD  furosemide  (LASIX ) 40 MG tablet Take 1 tablet (40 mg total) by mouth daily. 12/03/23  Yes Donnie Galea, MD  gabapentin  (NEURONTIN ) 400 MG capsule Take 1 capsule (400 mg total) by mouth 3 (three) times daily. 11/12/23  Yes Shah, Vipul, MD  ipratropium (ATROVENT) 0.03 % nasal spray Place 2 sprays into both nostrils 2 (two) times daily as needed for rhinitis. 05/26/23  Yes [provider]  loratadine  (CLARITIN ) 10 MG tablet Take 10 mg by mouth daily.   Yes [provider]  metoprolol  succinate (TOPROL -XL) 25 MG 24 hr tablet TAKE 1/2 TABLET AT BEDTIME 01/10/24  Yes Gollan, Timothy J, MD  potassium chloride  SA (KLOR-CON  M) 20 MEQ tablet Take 2 tablets (40 mEq total) by mouth daily. 12/03/23  Yes Donnie Galea, MD  rosuvastatin  (CRESTOR ) 20 MG tablet Take 1 tablet (20 mg total) by mouth daily. 12/20/23  Yes Gollan, Timothy J, MD  vitamin B-12 (CYANOCOBALAMIN ) 1000 MCG tablet Take 1 tablet (1,000 mcg total) by mouth daily. 04/24/18  Yes Donnie Galea, MD  omeprazole  (PRILOSEC) 20 MG capsule Take 1 capsule (20 mg total) by mouth 2 (two) times daily before a meal. Patient not taking: Reported on 02/07/2024 02/05/22   Donnie Galea, MD  polyethylene glycol powder (GLYCOLAX /MIRALAX ) 17 GM/SCOOP powder Take 17 g by mouth 2 (two) times daily as needed. 10/26/22   Donnie Galea, MD  sertraline  (ZOLOFT ) 100 MG tablet TAKE ONE AND A HALF TABLETS BY MOUTH EVERY DAY Patient taking differently: Take 150 mg by mouth daily. 12/02/23   Donnie Galea, MD     Family History  Problem Relation Age of Onset   Aneurysm Mother        brain   Heart disease Mother        Aneyursm    Hyperlipidemia  Mother    Hypertension Mother    Varicose Veins Mother    Bleeding Disorder Mother    Heart disease Father    Cirrhosis Father    Heart attack Father    Hyperlipidemia Father    Hypertension Father    Cancer Sister        lung   Heart disease Sister        Aneurysm   Hyperlipidemia Sister    Hypertension Sister    Varicose Veins Sister    Bleeding Disorder Sister    Heart disease Brother        Before age 42   Aneurysm Brother    Deep vein thrombosis Brother    Birth defects Brother    Hyperlipidemia Brother  Hypertension Brother    Peripheral vascular disease Daughter    Hyperlipidemia Daughter    Hypertension Daughter    Hypertension Other    Breast cancer Neg Hx    Colon cancer Neg Hx     Social History   Socioeconomic History   Marital status: Married    Spouse name: Royston Cornea    Number of children: 1   Years of education: Not on file   Highest education level: 11th grade  Occupational History    Comment: Retired    Occupation: Retired  Tobacco Use   Smoking status: Former    Current packs/day: 0.00    Average packs/day: 0.3 packs/day for 15.0 years (4.5 ttl pk-yrs)    Types: Cigarettes    Start date: 08/31/1981    Quit date: 08/31/1996    Years since quitting: 27.4    Passive exposure: Never   Smokeless tobacco: Never  Vaping Use   Vaping status: Never Used  Substance and Sexual Activity   Alcohol use: No    Alcohol/week: 0.0 standard drinks of alcohol   Drug use: No   Sexual activity: Not Currently  Other Topics Concern   Not on file  Social History Narrative   Lives with husband in a one level    Work - retired Producer, television/film/video   Diet - healthy   Right handed    Caffeine- 1 cup per day.      Had 1 daughter. Daughter is deceased   Social Drivers of Corporate investment banker Strain: Low Risk  (11/12/2023)   Overall Financial Resource Strain (CARDIA)    Difficulty of Paying Living Expenses: Not hard at all  Food Insecurity: No Food Insecurity  (11/15/2023)   Hunger Vital Sign    Worried About Running Out of Food in the Last Year: Never true    Ran Out of Food in the Last Year: Never true  Transportation Needs: No Transportation Needs (11/15/2023)   PRAPARE - Administrator, Civil Service (Medical): No    Lack of Transportation (Non-Medical): No  Physical Activity: Sufficiently Active (05/25/2023)   Exercise Vital Sign    Days of Exercise per Week: 5 days    Minutes of Exercise per Session: 30 min  Stress: No Stress Concern Present (05/25/2023)   [employer] of Occupational Health - Occupational Stress Questionnaire    Feeling of Stress : Not at all  Social Connections: Moderately Integrated (11/11/2023)   Social Connection and Isolation Panel    Frequency of Communication with Friends and Family: More than three times a week    Frequency of Social Gatherings with Friends and Family: More than three times a week    Attends Religious Services: 1 to 4 times per year    Active Member of [employer] or Organizations: No    Attends Banker Meetings: Never    Marital Status: Married    Review of Systems: A 12 point ROS discussed and pertinent positives are indicated in the HPI above.  All other systems are negative.    Vital Signs: BP (!) 139/56   Pulse 64   Temp 98 F (36.7 C) (Oral)   Resp 13   Ht 5' 2 (1.575 m)   Wt 137 lb 9.1 oz (62.4 kg)   SpO2 95%   BMI 25.16 kg/m   Advance Care Plan: The advanced care plan/surrogate decision maker was discussed at the time of visit and documented in the medical record.  Physical Exam Vitals reviewed.  HENT:     Mouth/Throat:     Mouth: Mucous membranes are moist.   Cardiovascular:     Rate and Rhythm: Normal rate and regular rhythm.     Heart sounds: Normal heart sounds.  Pulmonary:     Breath sounds: Normal breath sounds. No wheezing.  Abdominal:     Palpations: Abdomen is soft.   Skin:    General: Skin is warm and dry.   Neurological:      Mental Status: She is alert. Mental status is at baseline.   Psychiatric:     Comments: Spoke to husband at bedside He consents for procedure     Imaging: DG Swallowing Func-Speech Pathology Result Date: 02/15/2024 Table formatting from the original result was not included. Modified Barium Swallow Study Patient Details Name: MAEVYN RIORDAN MRN: [id number] Date of Birth: 27-Mar-1941 Today's Date: 02/15/2024 HPI/PMH: HPI: Patient is an 83 year old female admitted to [facility] with acute onset right-sided weakness.  Patient found to have left MCA CVA evolving.  Swallow and speech eval ordered.  Patient resides with her spouse and was using a walker prior to admission due to back issues per staff.  PT has prior h/o dysphagia after ACDF - that resolved per chart review.  She underwent MBS on 6/11 with no aspiration present. Patient with increased s/sx of aspiration on yesterdays (6/16) PO trials with episode of emesis. Clinical Impression: Patient presents with severe oropharyngeal dysphagia. Oral phase is characterized by significantly reduced lingual control/coordination and bolus preparation resulting in posterior spillage to the pyriform sinuses- occasionally into the trachea. Patient with deficits in bolus preparation resulting in prolonged mastication of D2 solids and oral residuals cleared through HTL wash. Pharyngeal phase is remarkable for reduced sensation and mistiming vs incomplete closure of the laryngeal vestibule and epiglottis. Patient with reduced BOT retraction resulting in BOT and vallecular residuals. These deficits resulted in silent aspiration of thin liquids unsuccessfully cleared through cued cough and audible gross aspiration of NTL and HTL via cup. Patient with adequate airway protection during HTL via tsp, puree and D2 solids. Unable to attempt compensatory strategies as patient has global aphasia. Recommend D1/HTL via tsp with medications crushed in puree. Patient should be  completely awake/alert during PO intake and receive full supervision. DIGEST Swallow Severity Rating*  Safety: 4  Efficiency: 1  Overall Pharyngeal Swallow Severity: 3 1: mild; 2: moderate; 3: severe; 4: profound *The Dynamic Imaging Grade of Swallowing Toxicity is standardized for the head and neck cancer population, however, demonstrates promising clinical applications across populations to standardize the clinical rating of pharyngeal swallow safety and severity. Factors that may increase risk of adverse event in presence of aspiration Roderick Civatte & Jessy Morocco 2021): Factors that may increase risk of adverse event in presence of aspiration Roderick Civatte & Jessy Morocco 2021): Limited mobility; Dependence for feeding and/or oral hygiene; Weak cough; Reduced cognitive function; Frail or deconditioned; Aspiration of thick, dense, and/or acidic materials; Frequent aspiration of large volumes Recommendations/Plan: Swallowing Evaluation Recommendations Swallowing Evaluation Recommendations Recommendations: PO diet PO Diet Recommendation: Dysphagia 1 (Pureed); Moderately thick liquids (Level 3, honey thick) Liquid Administration via: Spoon Medication Administration: Crushed with puree Supervision: Full assist for feeding; Staff to assist with self-feeding; Full supervision/cueing for swallowing strategies Swallowing strategies  : Slow rate; Small bites/sips; Check for pocketing or oral holding; Check for anterior loss Postural changes: Position pt fully upright for meals; Stay upright 30-60 min after meals Oral care recommendations: Oral care BID (2x/day) Recommended  consults: Consider dietitian consultation Caregiver Recommendations: Have oral suction available Treatment Plan Treatment Plan Treatment recommendations: Therapy as outlined in treatment plan below Follow-up recommendations: Skilled nursing-short term rehab (<3 hours/day) Functional status assessment: Patient has had a recent decline in their functional status and  demonstrates the ability to make significant improvements in function in a reasonable and predictable amount of time. Treatment frequency: Min 2x/week Treatment duration: 2 weeks Interventions: Compensatory techniques; Aspiration precaution training; Trials of upgraded texture/liquids; Patient/family education; Diet toleration management by SLP Recommendations Recommendations for follow up therapy are one component of a multi-disciplinary discharge planning process, led by the attending physician.  Recommendations may be updated based on patient status, additional functional criteria and insurance authorization. Assessment: Orofacial Exam: Orofacial Exam Oral Cavity: Oral Hygiene: WFL Oral Cavity - Dentition: Adequate natural dentition Oral Motor/Sensory Function: Suspected cranial nerve impairment (global aphasia) Anatomy: Anatomy: Presence of cervical hardware Boluses Administered: Boluses Administered Boluses Administered: Thin liquids (Level 0); Mildly thick liquids (Level 2, nectar thick); Moderately thick liquids (Level 3, honey thick); Puree; Solid  Oral Impairment Domain: Oral Impairment Domain Lip Closure: Interlabial escape, no progression to anterior lip Tongue control during bolus hold: Posterior escape of greater than half of bolus; Posterior escape of less than half of bolus Bolus preparation/mastication: Minimal chewing/mashing with majority of bolus unchewed Bolus transport/lingual motion: Slow tongue motion; Repetitive/disorganized tongue motion Oral residue: Residue collection on oral structures Location of oral residue : Tongue; Palate Initiation of pharyngeal swallow : Pyriform sinuses; No visible initiation at any location  Pharyngeal Impairment Domain: Pharyngeal Impairment Domain Soft palate elevation: No bolus between soft palate (SP)/pharyngeal wall (PW) Laryngeal elevation: Partial superior movement of thyroid  cartilage/partial approximation of arytenoids to epiglottic petiole Anterior  hyoid excursion: Complete anterior movement Epiglottic movement: Partial inversion Laryngeal vestibule closure: Incomplete, narrow column air/contrast in laryngeal vestibule Pharyngeal stripping wave : Present - diminished Pharyngeal contraction (A/P view only): N/A Pharyngoesophageal segment opening: Complete distension and complete duration, no obstruction of flow Tongue base retraction: Narrow column of contrast or air between tongue base and PPW Pharyngeal residue: Trace residue within or on pharyngeal structures Location of pharyngeal residue: Tongue base; Valleculae  Esophageal Impairment Domain: Esophageal Impairment Domain Esophageal clearance upright position: -- (DNT) Pill: Pill Consistency administered: -- (DTA 2/2 aspiration risk) Penetration/Aspiration Scale Score: Penetration/Aspiration Scale Score 1.  Material does not enter airway: Puree; Solid 7.  Material enters airway, passes BELOW cords and not ejected out despite cough attempt by patient: Mildly thick liquids (Level 2, nectar thick); Moderately thick liquids (Level 3, honey thick) 8.  Material enters airway, passes BELOW cords without attempt by patient to eject out (silent aspiration) : Thin liquids (Level 0) Compensatory Strategies: Compensatory Strategies Compensatory strategies: Yes Other(comment): Ineffective (cued cough) Ineffective Other(comment): Thin liquid (Level 0)   General Information: Caregiver present: No  Diet Prior to this Study: Dysphagia 2 (finely chopped); Mildly thick liquids (Level 2, nectar thick)   No data recorded  Respiratory Status: WFL   Supplemental O2: None (Room air)   History of Recent Intubation: No  Behavior/Cognition: Alert; Doesn't follow directions Self-Feeding Abilities: Needs set-up for self-feeding Baseline vocal quality/speech: Hypophonia/low volume Volitional Cough: Able to elicit Volitional Swallow: Able to elicit No data recorded Goal Planning: Prognosis for improved oropharyngeal function: Fair  Barriers to Reach Goals: Severity of deficits; Other (Comment); Language deficits No data recorded No data recorded Consulted and agree with results and recommendations: Nurse; Physician Pain: Pain Assessment Pain Assessment: Faces Faces Pain Scale: 2  Breathing: 0 Negative Vocalization: 0 Facial Expression: 0 Body Language: 0 Consolability: 0 PAINAD Score: 0 Pain Location: R knee with passive extension stretch Pain Descriptors / Indicators: Grimacing Pain Intervention(s): Limited activity within patient's tolerance; Monitored during session End of Session: Start Time:SLP Start Time (ACUTE ONLY): 0830 Stop Time: SLP Stop Time (ACUTE ONLY): 0900 Time Calculation:SLP Time Calculation (min) (ACUTE ONLY): 30 min Charges: SLP Evaluations $ SLP Speech Visit: 1 Visit SLP Evaluations $MBS Swallow: 1 Procedure $Swallowing Treatment: 1 Procedure SLP visit diagnosis: SLP Visit Diagnosis: Dysphagia, oropharyngeal phase (R13.12) Past Medical History: Past Medical History: Diagnosis Date  Acute hypoxic respiratory failure (HCC) 11/09/2023  Acute non-recurrent maxillary sinusitis 10/28/2022  Allergy   hay fever  Anemia   Anxiety   Back pain   Carotid artery occlusion   Cerebrovascular disease   extracranial; occlusive  Chicken pox   Coronary artery disease   Depression   Dizziness   Dizziness   DVT (deep venous thrombosis) (HCC)   Fainting spell   GERD (gastroesophageal reflux disease)   Headache   Heart murmur   Hyperlipidemia   Hypertension   Leg pain   Mitral regurgitation   PONV (postoperative nausea and vomiting)   severe nausea and vomiting  Pre-syncope   PVD (peripheral vascular disease) (HCC)   endarterectomy by Dr. Shirley Douglas  Seasonal allergies   Shortness of breath dyspnea   wth ambulation at times  Swelling of both ankles   and abdomen; takes Lasix  when needed  Thrombophlebitis   following childbirth  Ulcer  Past Surgical History: Past Surgical History: Procedure Laterality Date  ANTERIOR CERVICAL DECOMP/DISCECTOMY FUSION  N/A 01/11/2023  Procedure: Anterior Cervical Decompression/Discectomy Fusion Cervical Five-Cervical Six - Cervical Six-Cervical Seven;  Surgeon: Agustina Aldrich, MD;  Location: [facility] OR;  Service: Neurosurgery;  Laterality: N/A;  3C  APPENDECTOMY    BREAST SURGERY    saline implants  CARDIAC CATHETERIZATION    CAROTID ENDARTERECTOMY  1992  CAROTID ENDARTERECTOMY Right 02/13/2005  Re-do Right CE  CATARACT EXTRACTION, BILATERAL  2013  CHOLECYSTECTOMY, LAPAROSCOPIC  04/02/2014  Dr. Hortensia Ma  CORONARY ARTERY BYPASS GRAFT  2006  x3 Dr. Matt Song  ENDARTERECTOMY Left 07/20/2014  Procedure: ENDARTERECTOMY CAROTID-LEFT;  Surgeon: Mayo Speck, MD;  Location: [facility] OR;  Service: Vascular;  Laterality: Left;  EYE SURGERY Bilateral 10/2011  Cataract Left eye  SEPTOPLASTY    with bilateral inferior turbinate reductions  SPINE SURGERY  12/2015  TOTAL ABDOMINAL HYSTERECTOMY    UPPER GASTROINTESTINAL ENDOSCOPY    had polyps removed from esophagus  UPPER GI ENDOSCOPY   Cassidi Sockwell M.A., CCC-SLP 02/15/2024, 10:48 AM  ECHOCARDIOGRAM COMPLETE Result Date: 02/09/2024    ECHOCARDIOGRAM REPORT   Patient Name:   Catherine Green Date of Exam: 02/09/2024 Medical Rec #:  [id number]      Height:       62.0 in Accession #:    [id number]     Weight:       137.6 lb Date of Birth:  12/17/1940      BSA:          1.631 m Patient Age:    82 years       BP:           146/60 mmHg Patient Gender: F              HR:           68 bpm. Exam Location:  Inpatient Procedure: 2D Echo, Cardiac Doppler and Color Doppler (Both  Spectral and Color            Flow Doppler were utilized during procedure). Indications:    Stroke  History:        Patient has prior history of Echocardiogram examinations, most                 recent 11/10/2023. Risk Factors:Hypertension.  Sonographer:    Jeralene Mom Referring Phys: [id number] TAYLOR A PARCELLS IMPRESSIONS  1. Left ventricular ejection fraction, by estimation, is 60 to 65%. The left ventricle has normal function. The left  ventricle has no regional wall motion abnormalities. Left ventricular diastolic parameters are consistent with Grade III diastolic dysfunction (restrictive).  2. Right ventricular systolic function is mildly reduced. The right ventricular size is normal. There is mildly elevated pulmonary artery systolic pressure.  3. Left atrial size was moderately dilated.  4. The mitral valve is normal in structure. Trivial mitral valve regurgitation. No evidence of mitral stenosis.  5. The aortic valve is grossly normal. There is mild calcification of the aortic valve. There is mild thickening of the aortic valve. Aortic valve regurgitation is not visualized. Aortic valve sclerosis/calcification is present, without any evidence of aortic stenosis.  6. The inferior vena cava is normal in size with greater than 50% respiratory variability, suggesting right atrial pressure of 3 mmHg. Comparison(s): No significant change from prior study. Conclusion(s)/Recommendation(s): Otherwise normal echocardiogram, with minor abnormalities described in the report. No intracardiac source of embolism detected on this transthoracic study. Consider a transesophageal echocardiogram to exclude cardiac source of embolism if clinically indicated. FINDINGS  Left Ventricle: Left ventricular ejection fraction, by estimation, is 60 to 65%. The left ventricle has normal function. The left ventricle has no regional wall motion abnormalities. The left ventricular internal cavity size was normal in size. There is  no left ventricular hypertrophy. Left ventricular diastolic parameters are consistent with Grade III diastolic dysfunction (restrictive). Right Ventricle: The right ventricular size is normal. No increase in right ventricular wall thickness. Right ventricular systolic function is mildly reduced. There is mildly elevated pulmonary artery systolic pressure. The tricuspid regurgitant velocity  is 3.01 m/s, and with an assumed right atrial pressure of 3  mmHg, the estimated right ventricular systolic pressure is 39.2 mmHg. Left Atrium: Left atrial size was moderately dilated. Right Atrium: Right atrial size was normal in size. Pericardium: There is no evidence of pericardial effusion. Mitral Valve: The mitral valve is normal in structure. Trivial mitral valve regurgitation. No evidence of mitral valve stenosis. Tricuspid Valve: The tricuspid valve is normal in structure. Tricuspid valve regurgitation is trivial. No evidence of tricuspid stenosis. Aortic Valve: The aortic valve is grossly normal. There is mild calcification of the aortic valve. There is mild thickening of the aortic valve. Aortic valve regurgitation is not visualized. Aortic valve sclerosis/calcification is present, without any evidence of aortic stenosis. Aortic valve mean gradient measures 8.0 mmHg. Aortic valve peak gradient measures 13.9 mmHg. Aortic valve area, by VTI measures 1.17 cm. Pulmonic Valve: The pulmonic valve was not well visualized. Pulmonic valve regurgitation is not visualized. No evidence of pulmonic stenosis. Aorta: The aortic root, ascending aorta, aortic arch and descending aorta are all structurally normal, with no evidence of dilitation or obstruction. Venous: The inferior vena cava is normal in size with greater than 50% respiratory variability, suggesting right atrial pressure of 3 mmHg. IAS/Shunts: The atrial septum is grossly normal.  LEFT VENTRICLE PLAX 2D LVIDd:         3.70 cm  Diastology LVIDs:         2.50 cm   LV e' medial:    5.87 cm/s LV PW:         0.80 cm   LV E/e' medial:  25.2 LV IVS:        0.80 cm   LV e' lateral:   6.20 cm/s LVOT diam:     1.70 cm   LV E/e' lateral: 23.9 LV SV:         52 LV SV Index:   32 LVOT Area:     2.27 cm  RIGHT VENTRICLE RV Basal diam:  3.40 cm RV Mid diam:    3.00 cm RV S prime:     9.14 cm/s TAPSE (M-mode): 1.3 cm LEFT ATRIUM             Index        RIGHT ATRIUM           Index LA diam:        3.60 cm 2.21 cm/m   RA Area:      12.40 cm LA Vol (A2C):   72.2 ml 44.28 ml/m  RA Volume:   25.40 ml  15.58 ml/m LA Vol (A4C):   45.4 ml 27.84 ml/m LA Biplane Vol: 58.1 ml 35.63 ml/m  AORTIC VALVE AV Area (Vmax):    1.28 cm AV Area (Vmean):   1.25 cm AV Area (VTI):     1.17 cm AV Vmax:           186.67 cm/s AV Vmean:          130.667 cm/s AV VTI:            0.445 m AV Peak Grad:      13.9 mmHg AV Mean Grad:      8.0 mmHg LVOT Vmax:         105.00 cm/s LVOT Vmean:        71.700 cm/s LVOT VTI:          0.229 m LVOT/AV VTI ratio: 0.51  AORTA Ao Root diam: 2.30 cm Ao Asc diam:  2.50 cm MITRAL VALVE                TRICUSPID VALVE MV Area (PHT): 4.15 cm     TR Peak grad:   36.2 mmHg MV Decel Time: 183 msec     TR Vmax:        301.00 cm/s MR Peak grad: 91.4 mmHg MR Vmax:      478.00 cm/s   SHUNTS MV E velocity: 148.00 cm/s  Systemic VTI:  0.23 m MV A velocity: 63.60 cm/s   Systemic Diam: 1.70 cm MV E/A ratio:  2.33 Sheryle Donning MD Electronically signed by Sheryle Donning MD Signature Date/Time: 02/09/2024/12:51:45 PM    Final    DG Swallowing Func-Speech Pathology Result Date: 02/09/2024 Table formatting from the original result was not included. Modified Barium Swallow Study Patient Details Name: Catherine Green MRN: [id number] Date of Birth: 10/04/1940 Today's Date: 02/09/2024 HPI/PMH: HPI: Patient is an 83 year old female admitted to [facility] with acute onset right-sided weakness.  Patient found to have left MCA CVA evolving.  Swallow and speech eval ordered.  Patient resides with her spouse and was using a walker prior to admission due to back issues per staff.  PT has prior h/o dysphagia after ACDF - that resolved per chart review.  She underwent BSE 02/08/2024 and requires MBS due to CVA, motor planning deficits  negating her ability to cough on command and s/s of aspiration.  SLP follow up for dysphagia/dysphasia indicated. Clinical Impression: Clinical Impression: Patient presents with sensorimotor oropharyngeal  dysphagia resutling in delayed oral transiting with impaired oral control resulting in premature spillage of boluses into pharynx and minimal mastication of solids.  He benefited from puree bolus to aid oral transiting of solids- Pt pharyngeal swallow trigger was at pyriform sinus with liquids and over epiglottis with vallecula with solids/puree.  Larger bolus size results in improved pharyngeal swallow inititaion with liquids but delay continues.  Discoordination of swallow noted with pt propelling bolus from vallecula into oral cavity and reswallowing - This has likely occured at bedside as well.  No aspiration noted across all boluses and flash penetration cleared within the same swallow.  Pharyngeal swallow is strong fortunately.  Cueing pt to swallow appeared helpful to help elicit swallow but likely was just sensorimotor response.   Recommend consider puree/nectar diet and allow thin liquids via Provale cup - providing items that provide better sensory input.  Recommend thin water between meals after mouth care to faciliate hydration.  Will follow up for dysphagia management.  Please oral suction pt after meals and place boluses on the left side of pt's oral cavity. Factors that may increase risk of adverse event in presence of aspiration Roderick Civatte & Jessy Morocco 2021): Factors that may increase risk of adverse event in presence of aspiration Roderick Civatte & Jessy Morocco 2021): Limited mobility; Dependence for feeding and/or oral hygiene; Weak cough; Reduced cognitive function; Frail or deconditioned Recommendations/Plan: Swallowing Evaluation Recommendations Swallowing Evaluation Recommendations Recommendations: PO diet PO Diet Recommendation: Dysphagia 1 (Pureed); Thin liquids (Level 0) Liquid Administration via: Cup; Straw; Other (Comment) Medication Administration: Crushed with puree Supervision: Full assist for feeding; Staff to assist with self-feeding; Full supervision/cueing for swallowing strategies Swallowing  strategies  : Slow rate; Small bites/sips; Check for pocketing or oral holding; Check for anterior loss Postural changes: Position pt fully upright for meals; Stay upright 30-60 min after meals Oral care recommendations: Oral care before ice chips/water Recommended consults: Other(comment); Consider dietitian consultation Caregiver Recommendations: Have oral suction available Treatment Plan Treatment Plan Treatment recommendations: Therapy as outlined in treatment plan below Follow-up recommendations: Acute inpatient rehab (3 hours/day) Functional status assessment: Patient has had a recent decline in their functional status and demonstrates the ability to make significant improvements in function in a reasonable and predictable amount of time. Treatment frequency: Min 2x/week Treatment duration: 1 week Interventions: Compensatory techniques; Aspiration precaution training; Trials of upgraded texture/liquids; Patient/family education; Diet toleration management by SLP Recommendations Recommendations for follow up therapy are one component of a multi-disciplinary discharge planning process, led by the attending physician.  Recommendations may be updated based on patient status, additional functional criteria and insurance authorization. Assessment: Orofacial Exam: Orofacial Exam Oral Cavity: Oral Hygiene: WFL Oral Cavity - Dentition: Adequate natural dentition Oral Motor/Sensory Function: Suspected cranial nerve impairment CN V - Trigeminal: Right motor impairment; Right sensory impairment CN VII - Facial: Right motor impairment CN IX - Glossopharyngeal, CN X - Vagus: Right motor impairment CN XII - Hypoglossal: Right motor impairment Anatomy: Anatomy: Presence of cervical hardware (appearance of carotid endarterectomy clips) Boluses Administered: Boluses Administered Boluses Administered: Thin liquids (Level 0); Mildly thick liquids (Level 2, nectar thick); Puree; Solid  Oral Impairment Domain: Oral Impairment  Domain Lip Closure: Escape progressing to mid-chin Tongue control during bolus hold: Posterior escape of greater than half of bolus; Posterior escape of less than half of bolus Bolus  preparation/mastication: Minimal chewing/mashing with majority of bolus unchewed Bolus transport/lingual motion: Slow tongue motion; Repetitive/disorganized tongue motion Oral residue: Trace residue lining oral structures; Residue collection on oral structures Location of oral residue : Tongue Initiation of pharyngeal swallow : Pyriform sinuses; Posterior laryngeal surface of the epiglottis  Pharyngeal Impairment Domain: Pharyngeal Impairment Domain Soft palate elevation: No bolus between soft palate (SP)/pharyngeal wall (PW) Laryngeal elevation: Partial superior movement of thyroid  cartilage/partial approximation of arytenoids to epiglottic petiole Anterior hyoid excursion: Complete anterior movement Epiglottic movement: Complete inversion; Partial inversion Laryngeal vestibule closure: Incomplete, narrow column air/contrast in laryngeal vestibule Pharyngeal stripping wave : Present - diminished Pharyngeal contraction (A/P view only): N/A Pharyngoesophageal segment opening: Complete distension and complete duration, no obstruction of flow Tongue base retraction: Trace column of contrast or air between tongue base and PPW (with cracker bolus) Pharyngeal residue: Trace residue within or on pharyngeal structures Location of pharyngeal residue: Valleculae  Esophageal Impairment Domain: Esophageal Impairment Domain Esophageal clearance upright position: Complete clearance, esophageal coating Pill: Pill Consistency administered: -- (DNT due to aspiration risk) Penetration/Aspiration Scale Score: Penetration/Aspiration Scale Score 1.  Material does not enter airway: Puree; Solid 2.  Material enters airway, remains ABOVE vocal cords then ejected out: Thin liquids (Level 0); Mildly thick liquids (Level 2, nectar thick) Compensatory Strategies:  Compensatory Strategies Compensatory strategies: Yes Straw: Effective Effective Straw: Thin liquid (Level 0); Mildly thick liquid (Level 2, nectar thick) Other(comment): -- (extra tsp bolus, verbal cueing)   General Information: Caregiver present: No (spouse stayed in the pt's room)  Diet Prior to this Study: NPO   Temperature : Normal   Respiratory Status: WFL   Supplemental O2: None (Room air)   History of Recent Intubation: No  Behavior/Cognition: Alert; Doesn't follow directions Self-Feeding Abilities: Needs set-up for self-feeding Baseline vocal quality/speech: Hypophonia/low volume Volitional Cough: Unable to elicit Volitional Swallow: Unable to elicit No data recorded Goal Planning: Prognosis for improved oropharyngeal function: Fair Barriers to Reach Goals: Severity of deficits; Other (Comment); Language deficits No data recorded Patient/Family Stated Goal: for pt to get better Consulted and agree with results and recommendations: Pt unable/family or caregiver not available Pain: Pain Assessment Pain Assessment: Faces Pain Score: 0 Faces Pain Scale: 0 Breathing: 0 Negative Vocalization: 0 Facial Expression: 0 Body Language: 0 Consolability: 0 PAINAD Score: 0 End of Session: Start Time:SLP Start Time (ACUTE ONLY): 1005 Stop Time: SLP Stop Time (ACUTE ONLY): 1030 Time Calculation:SLP Time Calculation (min) (ACUTE ONLY): 25 min Charges: SLP Evaluations $ SLP Speech Visit: 1 Visit SLP Evaluations $BSS Swallow: 1 Procedure $MBS Swallow: 1 Procedure $ SLP EVAL LANGUAGE/SOUND PRODUCTION: 1 Procedure $Swallowing Treatment: 1 Procedure SLP visit diagnosis: SLP Visit Diagnosis: Dysphagia, oropharyngeal phase (R13.12) Past Medical History: Past Medical History: Diagnosis Date  Acute hypoxic respiratory failure (HCC) 11/09/2023  Acute non-recurrent maxillary sinusitis 10/28/2022  Allergy   hay fever  Anemia   Anxiety   Back pain   Carotid artery occlusion   Cerebrovascular disease   extracranial; occlusive  Chicken pox    Coronary artery disease   Depression   Dizziness   Dizziness   DVT (deep venous thrombosis) (HCC)   Fainting spell   GERD (gastroesophageal reflux disease)   Headache   Heart murmur   Hyperlipidemia   Hypertension   Leg pain   Mitral regurgitation   PONV (postoperative nausea and vomiting)   severe nausea and vomiting  Pre-syncope   PVD (peripheral vascular disease) (HCC)   endarterectomy by Dr. Shirley Douglas  Seasonal allergies  Shortness of breath dyspnea   wth ambulation at times  Swelling of both ankles   and abdomen; takes Lasix  when needed  Thrombophlebitis   following childbirth  Ulcer  Past Surgical History: Past Surgical History: Procedure Laterality Date  ANTERIOR CERVICAL DECOMP/DISCECTOMY FUSION N/A 01/11/2023  Procedure: Anterior Cervical Decompression/Discectomy Fusion Cervical Five-Cervical Six - Cervical Six-Cervical Seven;  Surgeon: Agustina Aldrich, MD;  Location: [facility] OR;  Service: Neurosurgery;  Laterality: N/A;  3C  APPENDECTOMY    BREAST SURGERY    saline implants  CARDIAC CATHETERIZATION    CAROTID ENDARTERECTOMY  1992  CAROTID ENDARTERECTOMY Right 02/13/2005  Re-do Right CE  CATARACT EXTRACTION, BILATERAL  2013  CHOLECYSTECTOMY, LAPAROSCOPIC  04/02/2014  Dr. Hortensia Ma  CORONARY ARTERY BYPASS GRAFT  2006  x3 Dr. Matt Song  ENDARTERECTOMY Left 07/20/2014  Procedure: ENDARTERECTOMY CAROTID-LEFT;  Surgeon: Mayo Speck, MD;  Location: [facility] OR;  Service: Vascular;  Laterality: Left;  EYE SURGERY Bilateral 10/2011  Cataract Left eye  SEPTOPLASTY    with bilateral inferior turbinate reductions  SPINE SURGERY  12/2015  TOTAL ABDOMINAL HYSTERECTOMY    UPPER GASTROINTESTINAL ENDOSCOPY    had polyps removed from esophagus  UPPER GI ENDOSCOPY   Maudie Sorrow, MS [facility] SLP Acute Rehab Services Office [phone] [place]al Comment 02/09/2024, 12:14 PM  VAS US  LOWER EXTREMITY VENOUS (DVT) Result Date: 02/08/2024  Lower Venous DVT Study Patient Name:  Catherine Green  Date of Exam:   02/08/2024 Medical Rec #: [id number]        Accession #:    [id number] Date of Birth: 01/03/1941       Patient Gender: F Patient Age:   78 years Exam Location:  [facility] Procedure:      VAS US  LOWER EXTREMITY VENOUS (DVT) Referring Phys: Fraser Jackson XU --------------------------------------------------------------------------------  Indications: Stroke.  Risk Factors: None identified. Limitations: Poor ultrasound/tissue interface and patient positioning, patient immobility. Comparison Study: No prior studies. Performing Technologist: Lerry Ransom RVT  Examination Guidelines: A complete evaluation includes B-mode imaging, spectral Doppler, color Doppler, and power Doppler as needed of all accessible portions of each vessel. Bilateral testing is considered an integral part of a complete examination. Limited examinations for reoccurring indications may be performed as noted. The reflux portion of the exam is performed with the patient in reverse Trendelenburg.  +---------+---------------+---------+-----------+----------+--------------+ RIGHT    CompressibilityPhasicitySpontaneityPropertiesThrombus Aging +---------+---------------+---------+-----------+----------+--------------+ CFV      Full           Yes      Yes                                 +---------+---------------+---------+-----------+----------+--------------+ SFJ      Full                                                        +---------+---------------+---------+-----------+----------+--------------+ FV Prox  Full                                                        +---------+---------------+---------+-----------+----------+--------------+ FV Mid   Full                                                        +---------+---------------+---------+-----------+----------+--------------+  FV Distal               Yes      Yes                                 +---------+---------------+---------+-----------+----------+--------------+ PFV      Full                                                         +---------+---------------+---------+-----------+----------+--------------+ POP      Full           Yes      Yes                                 +---------+---------------+---------+-----------+----------+--------------+ PTV      Full                                                        +---------+---------------+---------+-----------+----------+--------------+ PERO     Full                                                        +---------+---------------+---------+-----------+----------+--------------+   +---------+---------------+---------+-----------+----------+--------------+ LEFT     CompressibilityPhasicitySpontaneityPropertiesThrombus Aging +---------+---------------+---------+-----------+----------+--------------+ CFV      Full           Yes      Yes                                 +---------+---------------+---------+-----------+----------+--------------+ SFJ      Full                                                        +---------+---------------+---------+-----------+----------+--------------+ FV Prox  Full                                                        +---------+---------------+---------+-----------+----------+--------------+ FV Mid   Full                                                        +---------+---------------+---------+-----------+----------+--------------+ FV DistalFull                                                        +---------+---------------+---------+-----------+----------+--------------+  PFV      Full                                                        +---------+---------------+---------+-----------+----------+--------------+ POP      Full           Yes      Yes                                 +---------+---------------+---------+-----------+----------+--------------+ PTV      Full                                                         +---------+---------------+---------+-----------+----------+--------------+ PERO     Full                                                        +---------+---------------+---------+-----------+----------+--------------+     Summary: RIGHT: - There is no evidence of deep vein thrombosis in the lower extremity. However, portions of this examination were limited- see technologist comments above.  - No cystic structure found in the popliteal fossa.  LEFT: - There is no evidence of deep vein thrombosis in the lower extremity. However, portions of this examination were limited- see technologist comments above.  - No cystic structure found in the popliteal fossa.  *See table(s) above for measurements and observations. Electronically signed by Angela Kell MD on 02/08/2024 at 2:59:07 PM.    Final    CT HEAD POST STROKE FOLLOWUP/TIMED/STAT READ Result Date: 02/08/2024 CLINICAL DATA:  Follow-up examination for neuro deficit, stroke. EXAM: CT HEAD WITHOUT CONTRAST TECHNIQUE: Contiguous axial images were obtained from the base of the skull through the vertex without intravenous contrast. RADIATION DOSE REDUCTION: This exam was performed according to the departmental dose-optimization program which includes automated exposure control, adjustment of the mA and/or kV according to patient size and/or use of iterative reconstruction technique. COMPARISON:  Comparison made with MRI from 02/07/2024 FINDINGS: Brain: Evolving cytotoxic edema involving the left frontal lobe, consistent with an evolving acute left MCA distribution infarct, stable from prior MRI. No evidence for hemorrhagic transformation. Localized edema with partial effacement of the left lateral ventricle, but no significant midline shift, relatively similar to prior. No other acute intracranial hemorrhage. No other acute large vessel territory infarct. No mass lesion or hydrocephalus. No extra-axial fluid collection. Vascular: Asymmetric hyperdense left  MCA, consistent with previously identified occlusion. Skull: Scalp soft tissues and calvarium demonstrate no new finding. Sinuses/Orbits: Globes orbital soft tissues demonstrate no acute finding. Paranasal sinuses and mastoid air cells remain largely clear. Other: None. IMPRESSION: 1. Evolving acute left MCA distribution infarct, not significantly changed from recent MRI. No evidence for hemorrhagic transformation. Localized edema with partial effacement of the left lateral ventricle, but no significant midline shift. 2. No other new acute intracranial abnormality. Electronically Signed   By: Virgia Griffins M.D.   On: 02/08/2024 01:32   MR BRAIN WO  CONTRAST Result Date: 02/07/2024 CLINICAL DATA:  Follow-up examination for stroke. EXAM: MRI HEAD WITHOUT CONTRAST TECHNIQUE: Multiplanar, multiecho pulse sequences of the brain and surrounding structures were obtained without intravenous contrast. COMPARISON:  CTs from earlier the same day. FINDINGS: Brain: Cerebral volume within normal limits. Patchy T2/FLAIR hyperintensity involving the periventricular and deep white matter both cerebral hemispheres the consistent with chronic small vessel ischemic disease, mild to moderate in nature. Confluent restricted diffusion involving the left frontal lobe, consistent with a moderate size evolving left MCA territory infarct. Involvement of the left insula and left basal ganglia noted. No associated hemorrhage or significant regional mass effect. No other evidence for acute or subacute ischemia. No acute intracranial hemorrhage. Few punctate chronic micro hemorrhages noted involving the right cerebellum and right cerebral hemisphere. No mass lesion, midline shift or mass effect. No hydrocephalus or extra-axial fluid collection. Pituitary gland within normal limits. Vascular: Loss of normal flow void within the left ICA and MCA, consistent with occlusion as seen on prior CTA. Major intracranial vascular flow voids are  otherwise maintained. Skull and upper cervical spine: Craniocervical junction within normal limits. Bone marrow signal intensity normal. No scalp soft tissue abnormality. Sinuses/Orbits: Prior bilateral ocular lens replacement. Paranasal sinuses are largely clear. No mastoid effusion. Other: None. IMPRESSION: 1. Moderate sized acute left MCA territory infarct. No associated hemorrhage or significant regional mass effect. 2. Loss of normal flow void within the left ICA and MCA, consistent with occlusion as seen on prior CTA. 3. Underlying mild to moderate chronic microvascular ischemic disease. Electronically Signed   By: Virgia Griffins M.D.   On: 02/07/2024 20:30   CT ANGIO HEAD NECK W WO CM W PERF (CODE STROKE) Result Date: 02/07/2024 CLINICAL DATA:  Neuro deficit, acute, stroke suspected. EXAM: CT ANGIOGRAPHY HEAD AND NECK CT PERFUSION BRAIN TECHNIQUE: Multidetector CT imaging of the head and neck was performed using the standard protocol during bolus administration of intravenous contrast. Multiplanar CT image reconstructions and MIPs were obtained to evaluate the vascular anatomy. Carotid stenosis measurements (when applicable) are obtained utilizing NASCET criteria, using the distal internal carotid diameter as the denominator. Multiphase CT imaging of the brain was performed following IV bolus contrast injection. Subsequent parametric perfusion maps were calculated using RAPID software. RADIATION DOSE REDUCTION: This exam was performed according to the departmental dose-optimization program which includes automated exposure control, adjustment of the mA and/or kV according to patient size and/or use of iterative reconstruction technique. CONTRAST:  OMNIPAQUE  IOHEXOL  350 MG/ML SOLN COMPARISON:  CTA neck 04/22/2020 FINDINGS: CTA NECK FINDINGS Aortic arch: Standard branching with prominent calcified plaque involving the great vessel origins including a proximally 75% stenosis of the origin of the  brachiocephalic artery, similar to the prior CTA. 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 Similar appearance of calcified and ulcerated soft plaque in this region. Widely patent ICA. Left carotid system: Patent with 50% stenosis of the common carotid artery origin and less than 50% stenosis of the proximal ICA, similar to the prior CTA. Vertebral arteries: Patent without evidence of a significant stenosis or dissection within limitation of streak artifact which obscures a portion of the right V1 segment. Element left vertebral artery. Skeleton: C5-C7 ACDF. Advanced upper cervical facet arthrosis. C3-4 facet ankylosis. Other neck: No evidence of cervical lymphadenopathy or mass. Upper chest: Partially visualized small to moderate bilateral pleural effusions with associated atelectasis.  Status post CABG. Review of the MIP images confirms the above findings CTA HEAD FINDINGS Anterior circulation: The intracranial left ICA is patent proximally, however there is progressively diminished opacification more distally with the vessel appearing occluded beginning in the cavernous segment through the terminus. The left M1 and proximal left A1 segments are also occluded. There is reconstitution of the left ACA via the anterior communicating artery. There is mild-to-moderate reconstitution of the distal left M1 segment and left MCA branch vessels. The intracranial right ICA is patent with mild nonstenotic atherosclerosis. The right ACA and right MCA are patent without evidence of a significant proximal stenosis. No aneurysm is identified. Posterior circulation: The intracranial vertebral arteries are patent to the basilar with the right being hypoplastic distal to the PICA origin. Patent PICA and SCA origins are visualized bilaterally. The basilar artery is patent and mildly  irregular without a significant stenosis. Posterior communicating arteries are diminutive or absent. Both PCAs are patent without evidence of a significant proximal stenosis. No aneurysm is identified. Venous sinuses: As permitted by contrast timing, patent. Anatomic variants: None. Review of the MIP images confirms the above findings CT Brain Perfusion Findings: CT perfusion imaging is felt to be of limited diagnostic utility due to bolus timing, with the arterial upstroke being incompletely captured and without a good venous outflow curve. The perfusion software does not detect the large left MCA infarct which is apparent on the earlier noncontrast head CT and reports 22 mL of mismatch volume. These results were communicated to Dr. Lindzen at 3:21 pm on 02/07/2024 by text page via the [facility] messaging system. IMPRESSION: 1. 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 2. Limited CT perfusion imaging as described above. 3. 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 4. Unchanged 50% stenosis of the origin of the left common carotid artery and 75% stenosis of the brachiocephalic artery. 5. Partially visualized small to moderate bilateral pleural effusions. 6.  Aortic Atherosclerosis (ICD10-I70.0). Electronically Signed   By: Aundra Lee M.D.   On: 02/07/2024 15:40   CT HEAD CODE STROKE WO CONTRAST Result Date: 02/07/2024 CLINICAL DATA:  Code stroke. Neuro deficit, acute, stroke suspected. Fall this morning. Altered mental status. Aphasia, right-sided deficits, and left-sided facial droop. EXAM: CT HEAD WITHOUT CONTRAST TECHNIQUE: Contiguous axial images were obtained from the base of the skull through the vertex without intravenous contrast. RADIATION DOSE REDUCTION: This exam was performed according to the departmental dose-optimization program which includes automated exposure control, adjustment of the mA and/or kV  according to patient size and/or use of iterative reconstruction technique. COMPARISON:  Head MRI 08/03/2018 FINDINGS: Brain: There is evidence of an acute left MCA territory infarct involving portions of the frontal and parietal lobes (most notably at the level of the operculum), insula, caudate, lentiform nucleus, and likely internal capsule. No acute intracranial hemorrhage, significant midline shift, hydrocephalus, or extra-axial fluid collection is identified. Cerebral white matter hypodensities elsewhere are nonspecific but compatible with mild chronic small vessel ischemic disease. Cerebral volume is within normal limits for age. Vascular: Calcified atherosclerosis at the skull base. Hyperdense left M1 segment. Skull: No fracture or suspicious lesion. Sinuses/Orbits: Visualized paranasal sinuses and mastoid air cells are clear. Bilateral cataract extraction. Other: None. ASPECTS [facility] Stroke Program Early CT Score) - Ganglionic level infarction (caudate, lentiform nuclei, internal capsule, insula, M1-M3 cortex): 1 - Supraganglionic infarction (M4-M6 cortex): 2 Total score (0-10 with 10 being normal): 3 These results  were communicated to Dr. Lindzen at 2:33 pm on 02/07/2024 by text page via the [facility] messaging system. IMPRESSION: Acute left MCA territory infarct. ASPECTS of 3. Electronically Signed   By: Aundra Lee M.D.   On: 02/07/2024 14:33   MR LUMBAR SPINE WO CONTRAST Result Date: 02/03/2024 CLINICAL DATA:  Chronic lower back pain radiating into the bilateral legs. Evaluate for spinal stenosis. EXAM: MRI LUMBAR SPINE WITHOUT CONTRAST TECHNIQUE: Multiplanar, multisequence MR imaging of the lumbar spine was performed. No intravenous contrast was administered. COMPARISON:  Lumbar spine radiographs, 05/13/2023; MRI lumbar spine 11/12/2020; chest two views 01/22/2020 FINDINGS: Segmentation: Counting down from T1 on prior chest radiographs, there are tiny ribs at the vertebral body considered T12. Distal to  this, the next 5 vertebral bodies are considered L1 through L5. On the current MRI axial series 113, image 41 corresponds to the L5-S1 disc space. This is in agreement with a 11/12/2020 lumbar spine MRI report. There is partial sacralization of L5. Alignment:  There is 2 mm retrolisthesis of L4 on L5, unchanged. Vertebrae: Vertebral body heights are maintained. Disc spaces are preserved. Decreased disc hydration is greatest at T10-11 and L2-3 through L4-5. Tiny 9 fat intensity anterior superior T12 vertebral body hemangioma is unchanged (sagittal series 206, image 8). Tiny superior L3 endplate degenerative Schmorl's node is unchanged. Conus medullaris and cauda equina: Conus extends to the mid L1 level. Conus and cauda equina appear normal. Paraspinal and other soft tissues: Partially visualized multiple decreased T1 increased T2 signal right renal cysts are again seen, not significantly changed from prior. No follow-up imaging is recommended. Disc levels: L1-2: No posterior disc bulge, central canal narrowing, or neuroforaminal stenosis. L2-3: Mild bilateral facet joint hypertrophy. Mild broad-based posterior disc bulge. Mild flattening of the ventral thecal sac, unchanged. No central canal stenosis. Mild bilateral intraforaminal disc extension. Mild left neuroforaminal stenosis is unchanged to minimally worsened from prior. L3-4: Mild-to-moderate bilateral facet joint and ligamentum flavum hypertrophy. Mild-to-moderate broad-based posterior disc osteophyte complex with bilateral intraforaminal extension. New superimposed left lateral recess posterior disc protrusion measuring up to 4 mm in AP dimension (sagittal series 105, image 10 and axial series 113, image 29) newly minimally posteriorly displaces the descending left L4 nerve within the left lateral recess. New moderate left lateral recess stenosis. Mildly worsened mild-to-moderate left neuroforaminal stenosis. Very mild right neuroforaminal narrowing is  similar to prior. No central canal stenosis. L4-5: Postsurgical changes are again seen of right hemilaminotomy. Mild-to-moderate bilateral facet joint hypertrophy. Moderate broad-based posterior disc bulge measure up to 6 mm in AP dimension, similar to prior. Disc minimally impresses on the descending right L5 nerve within the right lateral recess. Mild-to-moderate right and mild left lateral recess stenosis. Overall borderline mild central canal stenosis. Very mild bilateral neuroforaminal narrowing without true stenosis. No significant change. L5-S1: Rudimentary disc. No posterior disc bulge, central canal narrowing, or neuroforaminal stenosis. Left L5-S1 transitional assimilation joint (sagittal image 15). IMPRESSION: Compared to 11/12/2020: 1. Transitional lumbosacral anatomy. Recommend correlation with the numbering nomenclature in this report prior to any image guided invasive procedure. 2. At L3-4, there is a new left lateral recess posterior disc protrusion which minimally posteriorly displaces the descending left L4 nerve within the left lateral recess. New moderate left lateral recess stenosis. Mildly worsened mild-to-moderate left neuroforaminal stenosis. 3. At L4-5, there are postsurgical changes of right hemilaminotomy. Moderate broad-based posterior disc bulge minimally impresses on the descending right L5 nerve within the right lateral recess. Mild-to-moderate right and mild left lateral  recess stenosis. 4. At L2-3, there is mild left neuroforaminal stenosis, unchanged to minimally worsened from prior. Electronically Signed   By: Bertina Broccoli M.D.   On: 02/03/2024 19:12    Labs:  CBC: Recent Labs    02/08/24 0525 02/10/24 0723 02/11/24 0643 02/15/24 0955  WBC 7.2 7.2 7.8 9.5  HGB 13.4 11.0* 11.8* 11.8*  HCT 41.9 35.6* 38.8 38.1  PLT 305 250 251 288    COAGS: Recent Labs    02/07/24 1419  INR 1.0  APTT 24    BMP: Recent Labs    02/10/24 0723 02/11/24 0643 02/15/24 0826  02/16/24 0549  NA 144 144 142 140  K 3.4* 3.9 5.3* 4.8  CL 111 111 109 106  CO2 23 24 21* 27  GLUCOSE 92 97 87 97  BUN 16 17 21 20   CALCIUM  8.7* 8.9 8.7* 8.7*  CREATININE 0.79 0.77 0.78 0.75  GFRNONAA >60 >60 >60 >60    LIVER FUNCTION TESTS: Recent Labs    09/20/23 1334 02/07/24 1419 02/08/24 0525  BILITOT 0.4 0.4 0.7  AST 22 22 25   ALT 14 14 15   ALKPHOS 112 101 110  PROT 7.0 6.7 7.1  ALBUMIN 4.7 4.0 4.0    TUMOR MARKERS: No results for input(s): AFPTM, CEA, CA199, CHROMGRNA in the last 8760 hours.  Assessment and Plan:  Scheduled for percutaneous gastric tube placement in IR 6/19 Dysphagia  Risks and benefits image guided gastrostomy tube placement was discussed with the patient's husband at bedside including, but not limited to the need for a barium enema during the procedure, bleeding, infection, peritonitis and/or damage to adjacent structures.  All questions were answered, husband is agreeable to proceed.  Consent signed and in chart.  Thank you for this interesting consult.  I greatly enjoyed meeting Catherine Green and look forward to participating in their care.  A copy of this report was sent to the requesting provider on this date.  Electronically Signed: Ellen Guppy, PA-C 02/16/2024, 10:48 AM   I spent a total of 40 Minutes    in face to face in clinical consultation, greater than 50% of which was counseling/coordinating care for percutaneous gastric tube placement

## 2024-02-16 NOTE — Progress Notes (Signed)
 Occupational Therapy Treatment Patient Details Name: Catherine Green MRN: [id number] DOB: 12/07/1940 Today's Date: 02/16/2024   History of present illness Patient is a 83 year old female admitted to [facility] on 02/07/24 with right side weakness, expressive aphasia, found to have large MCA stroke. History of HTN, GERD, CAD s/p CABG, A-fib, anemia, CHF, anxiety, depression, neuropathy, COPD, spinal stenosis s/p back surgery.   OT comments  Pt readily willing to work with therapies. Improved bed mobility, requires CGA and increased time. Stood multiple times with and without RW with min to mod assist and pivoted to chair with RW and +2 moderate assistance with facilitation for stepping on R. Worked on weight bearing/shifting over R extended hand and elbow while reaching with L hand in wide ranges across midline. Pt with incontinence of bowel, total assist for pericare. Pt completed one grooming task with min assist and self fed with spoon using L hand with assist to control bite size. Patient will benefit from continued inpatient follow up therapy, <3 hours/day.      If plan is discharge home, recommend the following:  Two people to help with walking and/or transfers;A lot of help with bathing/dressing/bathroom;Direct supervision/assist for financial management;Direct supervision/assist for medications management;Supervision due to cognitive status   Equipment Recommendations  BSC/3in1;Wheelchair (measurements OT);Wheelchair cushion (measurements OT);Hospital bed;Tub/shower seat    Recommendations for Other Services      Precautions / Restrictions Precautions Precautions: Fall Recall of Precautions/Restrictions: Impaired Required Braces or Orthoses: Splint/Cast Splint/Cast: R resting hand splint Splint/Cast - Date Prophylactic Dressing Applied (if applicable): 02/10/24 Restrictions Weight Bearing Restrictions Per Provider Order: No       Mobility Bed Mobility Overal bed mobility: Needs  Assistance Bed Mobility: Supine to Sit     Supine to sit: Min assist, +2 for safety/equipment     General bed mobility comments: Min to min guard assist for tactile cues to come to EOB.  Pt initiating movement to command and responding to multimodal cues to get EOB (R side).    Transfers Overall transfer level: Needs assistance Equipment used: Rolling walker (2 wheels), None Transfers: Sit to/from Stand Sit to Stand: Mod assist, Min assist, +2 physical assistance Stand pivot transfers: Mod assist, +2 safety/equipment         General transfer comment: progressed from mod to min assist for sit to stand, second person facilitated pivot/stepping with R foot and blocking R knee as she fatigued     Balance Overall balance assessment: Needs assistance Sitting-balance support: Feet supported, Bilateral upper extremity supported Sitting balance-Leahy Scale: Fair Sitting balance - Comments: sitting EOB with supervision with bil or unilateral UE support, working on weight shifting, reaching outside of BOS and WB through R UE with hand over hand assist.  Pt showing some signs of flexion synergy starting in R UE with attempts to move arm. Postural control: Right lateral lean   Standing balance-Leahy Scale: Poor Standing balance comment: needs at least min up to mod assist to stand                           ADL either performed or assessed with clinical judgement   ADL Overall ADL's : Needs assistance/impaired Eating/Feeding: Minimal assistance;Sitting Eating/Feeding Details (indicate cue type and reason): assist to control bite size, leads with L hand Grooming: Wash/dry face;Sitting;Minimal assistance Grooming Details (indicate cue type and reason): cues for thoroughness on R side of face  Toileting- Clothing Manipulation and Hygiene: Total assistance;+2 for physical assistance;Sit to/from stand Toileting - Clothing Manipulation Details (indicate  cue type and reason): pt with bowel incontinence            Extremity/Trunk Assessment              Vision       Perception     Praxis     Communication Communication Communication: Impaired Factors Affecting Communication: Difficulty expressing self   Cognition Arousal: Alert Behavior During Therapy: WFL for tasks assessed/performed Cognition: Difficult to assess (global aphasia, receptive appears less impaired than expressive) Difficult to assess due to: Impaired communication           OT - Cognition Comments: able to follow simple one step commands with multimoda cues                 Following commands: Impaired Following commands impaired: Follows one step commands with increased time      Cueing   Cueing Techniques: Verbal cues, Gestural cues, Tactile cues, Visual cues  Exercises Other Exercises Other Exercises: Body on Arm, LE crossing midline to reach with elbow stabilized    Shoulder Instructions       General Comments      Pertinent Vitals/ Pain       Pain Assessment Pain Assessment: Faces Faces Pain Scale: Hurts a little bit Pain Location: grimaces with mobility, effort vs pain Pain Descriptors / Indicators: Grimacing Pain Intervention(s): Monitored during session  Home Living                                          Prior Functioning/Environment              Frequency  Min 2X/week        Progress Toward Goals  OT Goals(current goals can now be found in the care plan section)  Progress towards OT goals: Progressing toward goals  Acute Rehab OT Goals OT Goal Formulation: With family Time For Goal Achievement: 02/22/24 Potential to Achieve Goals: Good  Plan      Co-evaluation    PT/OT/SLP Co-Evaluation/Treatment: Yes Reason for Co-Treatment: To address functional/ADL transfers;For patient/therapist safety PT goals addressed during session: Mobility/safety with mobility;Proper use of  DME;Strengthening/ROM;Balance OT goals addressed during session: ADL's and self-care      AM-PAC OT 6 Clicks Daily Activity     Outcome Measure   Help from another person eating meals?: A Little Help from another person taking care of personal grooming?: A Lot Help from another person toileting, which includes using toliet, bedpan, or urinal?: Total Help from another person bathing (including washing, rinsing, drying)?: A Lot Help from another person to put on and taking off regular upper body clothing?: A Lot Help from another person to put on and taking off regular lower body clothing?: Total 6 Click Score: 11    End of Session Equipment Utilized During Treatment: Gait belt;Rolling walker (2 wheels)  OT Visit Diagnosis: Unsteadiness on feet (R26.81);Other abnormalities of gait and mobility (R26.89);Muscle weakness (generalized) (M62.81);Hemiplegia and hemiparesis Hemiplegia - Right/Left: Right Hemiplegia - dominant/non-dominant: Dominant Hemiplegia - caused by: Cerebral infarction   Activity Tolerance Patient tolerated treatment well   Patient Left in chair;with call bell/phone within reach;with chair alarm set;with family/visitor present   Nurse Communication Other (comment) (aware pt had BM)        Time: 1610-9604  OT Time Calculation (min): 31 min  Charges: OT General Charges $OT Visit: 1 Visit OT Treatments $Neuromuscular Re-education: 8-22 mins  Avanell Leigh, OTR/L Acute Rehabilitation Services Office: [phone]   Jonette Nestle 02/16/2024, 12:51 PM

## 2024-02-16 NOTE — Progress Notes (Signed)
 Physical Therapy Treatment Patient Details Name: Catherine Green MRN: [id number] DOB: Jan 14, 1941 Today's Date: 02/16/2024   History of Present Illness Patient is a 83 year old female admitted to [facility] on 02/07/24 with right side weakness, expressive aphasia, found to have large MCA stroke. History of HTN, GERD, CAD s/p CABG, A-fib, anemia, CHF, anxiety, depression, neuropathy, COPD, spinal stenosis s/p back surgery.    PT Comments  Pt is progressing well with mobility.  She is initiating movement, stating one word answers at times, following more basic commands and requiring less assistance overall.  She initiated stepping to get to the chair with RW and two person light mod assist today.  She has excellent rehab potential. PT will continue to follow acutely for safe mobility progression    If plan is discharge home, recommend the following: A lot of help with walking and/or transfers;A lot of help with bathing/dressing/bathroom;Assistance with cooking/housework;Assist for transportation;Supervision due to cognitive status;Help with stairs or ramp for entrance   Can travel by private vehicle     No  Equipment Recommendations  Wheelchair (measurements PT);Hospital bed;BSC/3in1;Wheelchair cushion (measurements PT)    Recommendations for Other Services Rehab consult     Precautions / Restrictions Precautions Precautions: Fall Recall of Precautions/Restrictions: Impaired Restrictions Weight Bearing Restrictions Per Provider Order: No     Mobility  Bed Mobility Overal bed mobility: Needs Assistance Bed Mobility: Supine to Sit     Supine to sit: Min assist, +2 for safety/equipment     General bed mobility comments: Min to min guard assist for tactile cues to come to EOB.  Pt initiating movement to command and responding to multimodal cues to get EOB (R side).    Transfers Overall transfer level: Needs assistance Equipment used: Rolling walker (2 wheels), None (RW several times, none  several times) Transfers: Sit to/from Stand Sit to Stand: Mod assist, Min assist, +2 physical assistance Stand pivot transfers: Mod assist, +2 safety/equipment Step pivot transfers: Mod assist, +2 physical assistance, +2 safety/equipment       General transfer comment: Pt is light mod working towards min assist for sit to stand transitions, one therapist at foot/leg during step pivot with RW, one therapist at trunk and supporting R UE/hand on RW.    Ambulation/Gait                   Stairs             Wheelchair Mobility     Tilt Bed    Modified Rankin (Stroke Patients Only) Modified Rankin (Stroke Patients Only) Pre-Morbid Rankin Score: No symptoms Modified Rankin: Moderately severe disability     Balance Overall balance assessment: Needs assistance   Sitting balance-Leahy Scale: Fair Sitting balance - Comments: sitting EOB with supervision with bil or unilateral UE support, working on weight shifting, reaching outside of BOS and WB through R UE with hand over hand assist.  Pt showing some signs of flexion synergy starting in R UE with attempts to move arm. Postural control: Right lateral lean Standing balance support: Bilateral upper extremity supported, Single extremity supported Standing balance-Leahy Scale: Poor Standing balance comment: needs at least min up to mod assist to stand                            Communication Communication Communication: Impaired Factors Affecting Communication: Difficulty expressing self  Cognition Arousal: Alert Behavior During Therapy: [facility] for tasks assessed/performed (smiling today)   PT -  Cognitive impairments: Difficult to assess Difficult to assess due to: Impaired communication                     PT - Cognition Comments: following one step commands today to the best of her physical ability, some deminished attention, however, also HOH.  Bil hearing aids donned for session. Following  commands: Impaired Following commands impaired: Follows one step commands with increased time    Cueing Cueing Techniques: Verbal cues, Gestural cues, Tactile cues, Visual cues  Exercises General Exercises - Lower Extremity Hip Flexion/Marching: AROM, Both, 10 reps, Seated    General Comments        Pertinent Vitals/Pain Pain Assessment Pain Assessment: Faces Faces Pain Scale: Hurts a little bit Pain Location: difficult to tell, some mild grimacing with mobility Pain Descriptors / Indicators: Grimacing Pain Intervention(s): Limited activity within patient's tolerance, Monitored during session, Repositioned    Home Living                          Prior Function            PT Goals (current goals can now be found in the care plan section) Acute Rehab PT Goals Patient Stated Goal: spouse goal is to regain independence to go home with assistance, get back to shopping at [facility] Progress towards PT goals: Progressing toward goals    Frequency    Min 1X/week      PT Plan      Co-evaluation PT/OT/SLP Co-Evaluation/Treatment: Yes Reason for Co-Treatment: To address functional/ADL transfers;For patient/therapist safety PT goals addressed during session: Mobility/safety with mobility;Proper use of DME;Strengthening/ROM;Balance        AM-PAC PT 6 Clicks Mobility   Outcome Measure  Help needed turning from your back to your side while in a flat bed without using bedrails?: A Little Help needed moving from lying on your back to sitting on the side of a flat bed without using bedrails?: A Little Help needed moving to and from a bed to a chair (including a wheelchair)?: A Lot Help needed standing up from a chair using your arms (e.g., wheelchair or bedside chair)?: A Lot Help needed to walk in hospital room?: Total Help needed climbing 3-5 steps with a railing? : Total 6 Click Score: 12    End of Session Equipment Utilized During Treatment: Gait belt Activity  Tolerance: Patient limited by fatigue Patient left: in chair;with call bell/phone within reach;with family/visitor present Nurse Communication: Mobility status;Other (comment) (may still be having a BM) PT Visit Diagnosis: Muscle weakness (generalized) (M62.81);Difficulty in walking, not elsewhere classified (R26.2);Pain;Hemiplegia and hemiparesis Hemiplegia - Right/Left: Right Hemiplegia - dominant/non-dominant: Dominant Hemiplegia - caused by: Cerebral infarction Pain - part of body:  (generalized)     Time: 1610-9604 PT Time Calculation (min) (ACUTE ONLY): 28 min  Charges:    $Therapeutic Activity: 8-22 mins PT General Charges $$ ACUTE PT VISIT: 1 Visit          Alveria Avena, PT, DPT  Acute Rehabilitation Secure chat is best for contact #[phone] office    02/16/2024, 10:39 AM

## 2024-02-17 ENCOUNTER — Inpatient Hospital Stay (HOSPITAL_COMMUNITY)

## 2024-02-17 DIAGNOSIS — I639 Cerebral infarction, unspecified: Secondary | ICD-10-CM | POA: Diagnosis not present

## 2024-02-17 HISTORY — PX: IR GASTROSTOMY TUBE MOD SED: IMG625

## 2024-02-17 LAB — PROTIME-INR
INR: 1.1 (ref 0.8–1.2)
Prothrombin Time: 14.6 s (ref 11.4–15.2)

## 2024-02-17 MED ORDER — LIDOCAINE HCL 1 % IJ SOLN
INTRAMUSCULAR | Status: AC
  Filled 2024-02-17: qty 20

## 2024-02-17 MED ORDER — ENOXAPARIN SODIUM 60 MG/0.6ML IJ SOSY
60.0000 mg | PREFILLED_SYRINGE | Freq: Two times a day (BID) | INTRAMUSCULAR | Status: DC
  Administered 2024-02-17 – 2024-02-18 (×2): 60 mg via SUBCUTANEOUS
  Filled 2024-02-17 (×2): qty 0.6

## 2024-02-17 MED ORDER — CEFAZOLIN SODIUM-DEXTROSE 2-4 GM/100ML-% IV SOLN
INTRAVENOUS | Status: AC | PRN
  Administered 2024-02-17: 2 g via INTRAVENOUS

## 2024-02-17 MED ORDER — GLUCAGON HCL RDNA (DIAGNOSTIC) 1 MG IJ SOLR
INTRAMUSCULAR | Status: AC
Start: 2024-02-17 — End: 2024-02-17
  Filled 2024-02-17: qty 1

## 2024-02-17 MED ORDER — ADULT MULTIVITAMIN W/MINERALS CH
1.0000 | ORAL_TABLET | Freq: Every day | ORAL | Status: DC
  Administered 2024-02-18: 1
  Filled 2024-02-17: qty 1

## 2024-02-17 MED ORDER — FAMOTIDINE IN NACL 20-0.9 MG/50ML-% IV SOLN
20.0000 mg | Freq: Once | INTRAVENOUS | Status: AC
  Administered 2024-02-17: 20 mg via INTRAVENOUS
  Filled 2024-02-17: qty 50

## 2024-02-17 MED ORDER — FENTANYL CITRATE (PF) 100 MCG/2ML IJ SOLN
INTRAMUSCULAR | Status: AC
Start: 2024-02-17 — End: 2024-02-17
  Filled 2024-02-17: qty 2

## 2024-02-17 MED ORDER — CEFAZOLIN SODIUM-DEXTROSE 2-4 GM/100ML-% IV SOLN
INTRAVENOUS | Status: AC
  Filled 2024-02-17: qty 100

## 2024-02-17 MED ORDER — GLUCAGON HCL RDNA (DIAGNOSTIC) 1 MG IJ SOLR
INTRAMUSCULAR | Status: AC | PRN
  Administered 2024-02-17 (×2): .5 mg via INTRAVENOUS

## 2024-02-17 MED ORDER — MIDAZOLAM HCL 2 MG/2ML IJ SOLN
INTRAMUSCULAR | Status: AC
Start: 2024-02-17 — End: 2024-02-17
  Filled 2024-02-17: qty 2

## 2024-02-17 MED ORDER — FENTANYL CITRATE (PF) 100 MCG/2ML IJ SOLN
INTRAMUSCULAR | Status: AC | PRN
  Administered 2024-02-17 (×2): 25 ug via INTRAVENOUS

## 2024-02-17 MED ORDER — LIDOCAINE VISCOUS HCL 2 % MT SOLN
OROMUCOSAL | Status: AC
  Filled 2024-02-17: qty 15

## 2024-02-17 MED ORDER — IOHEXOL 300 MG/ML  SOLN
50.0000 mL | Freq: Once | INTRAMUSCULAR | Status: AC | PRN
  Administered 2024-02-17: 15 mL

## 2024-02-17 MED ORDER — MIDAZOLAM HCL 2 MG/2ML IJ SOLN
INTRAMUSCULAR | Status: AC | PRN
  Administered 2024-02-17: 1 mg via INTRAVENOUS

## 2024-02-17 MED ORDER — JEVITY 1.2 CAL PO LIQD
1000.0000 mL | ORAL | Status: DC
  Administered 2024-02-18: 1000 mL
  Filled 2024-02-17: qty 1000

## 2024-02-17 NOTE — Plan of Care (Signed)
 No adverse change in patient condition.

## 2024-02-17 NOTE — Progress Notes (Signed)
   02/17/24 1246  Mobility  Activity Transferred from bed to chair  Level of Assistance Moderate assist, patient does 50-74%  Assistive Device Stedy  Activity Response Tolerated fair  Mobility Referral Yes  Mobility visit 1 Mobility  Mobility Specialist Start Time (ACUTE ONLY) 1246  Mobility Specialist Stop Time (ACUTE ONLY) 1312  Mobility Specialist Time Calculation (min) (ACUTE ONLY) 26 min   Mobility Specialist: Progress Note  Pre-Mobility:      HR 71 , SpO2 95%  Pt agreeable to mobility session - received in bed. Pt grabbing abd and grimacing, pain in abd area. Returned to chair with all needs met - call bell within reach. Chair alarm on. Tele Leads off, RN notified immediately.  Isla Mari, BS Mobility Specialist Please contact via SecureChat or  Rehab office at [phone].

## 2024-02-17 NOTE — Plan of Care (Signed)
 No adverse changes in condition. Vancomycin  administered. Tolerated well.

## 2024-02-17 NOTE — TOC Progression Note (Signed)
 Transition of Care [facility]) - Progression Note    Patient Details  Name: Catherine Green MRN: [id number] Date of Birth: 1940-11-20  Transition of Care [facility]) CM/SW Contact  Tandy Fam, [place] Phone Number: 02/17/2024, 2:52 PM  Clinical Narrative:   CSW following for discharge to SNF. Noting that patient received peg today. CSW updated [employer] and provided information on tube feeding, confirmed that they have tube feeding available for patient. CSW to follow.    Expected Discharge Plan: Skilled Nursing Facility Barriers to Discharge: Continued Medical Work up, English as a second language teacher  Expected Discharge Plan and Services In-house Referral: Clinical Social Work   Post Acute Care Choice: Skilled Nursing Facility Living arrangements for the past 2 months: Single Family Home                                       Social Determinants of Health (SDOH) Interventions SDOH Screenings   Food Insecurity: No Food Insecurity (11/15/2023)  Housing: Low Risk  (11/15/2023)  Transportation Needs: No Transportation Needs (11/15/2023)  Utilities: Not At Risk (11/15/2023)  Alcohol Screen: Low Risk  (05/25/2023)  Depression (PHQ2-9): Low Risk  (05/25/2023)  Financial Resource Strain: Low Risk  (11/12/2023)  Physical Activity: Sufficiently Active (05/25/2023)  Social Connections: Moderately Integrated (11/11/2023)  Stress: No Stress Concern Present (05/25/2023)  Tobacco Use: Medium Risk (02/07/2024)  Health Literacy: Adequate Health Literacy (05/25/2023)    Readmission Risk Interventions     No data to display

## 2024-02-17 NOTE — Progress Notes (Signed)
 S/p PEG placement. Ok to resume lovenox  this PM per Aimee Han after discussion on 6/18.   Lovenox  60mg  SQ BID start this PM F/u resume apixaban   Ivery Marking, PharmD, BCIDP, AAHIVP, CPP Infectious Disease Pharmacist 02/17/2024 9:59 AM

## 2024-02-17 NOTE — Procedures (Signed)
Interventional Radiology Procedure Note  Procedure: Percutaneous gastrostomy tube placement  Complications: None  Estimated Blood Loss: < 10 mL  Findings: 20 Fr bumper retention gastrostomy tube placed with tip in body of stomach. OK to use in 24 hours.  Sarajane Fambrough T. Reyden Smith, M.D Pager:  [phone]   

## 2024-02-17 NOTE — Progress Notes (Signed)
 PROGRESS NOTE    Catherine Green  ZOX:[id number] DOB: 04-30-1941 DOA: 02/07/2024 PCP: Donnie Galea, MD   Brief Narrative:  83 y.o. female with medical history significant of hypertension, hyperlipidemia, GERD, carotid artery disease, CAD status post CABG, atrial fibrillation, anemia, chronic diastolic CHF, anxiety, depression, neuropathy, COPD, spinal stenosis presened with right-sided weakness and expressive aphasia evaluation of large left MCA stroke.  She had significant dysphagia and aphasia, gradually improving, advance to dysphagia 1 with nectar thick liquid diet, has been doing okay over the weekend, she was reassessed again on Monday given significant regurgitation of fluid intake, MBS continues to be significant for silent aspiration, and her liquid has been changed to honey thick liquid, at this point she will need PEG tube to ensure safe route for medication and calorie intake.  Husband agreeable.  IR evaluating for possible PEG tube placement in the next 24 to 48 hours.  Assessment & Plan:   Principal Problem:   Acute CVA (cerebrovascular accident) (HCC) Active Problems:   Essential hypertension   CAD, ARTERY BYPASS GRAFT   Anxiety and depression   Hyperlipidemia   Chronic diastolic CHF (congestive heart failure) (HCC)   Carotid stenosis   Spinal stenosis of lumbar region   Neuropathy   GERD (gastroesophageal reflux disease)   Anemia   Chronic obstructive pulmonary disease with acute exacerbation (HCC)   Atrial fibrillation (HCC)  Large acute MCA territory infarct/CVA  - Patient presents with right-sided weakness R more than leg, expressive aphasia, right-sided hemineglect.  -CT L MCA infarct with left MCA hyperdense sign -CTA head and neck left ICA occluded at siphon, as well as left MCA and ACA occlusion - Workup significant for large left MCA territory infarct. - 2D echo EF 60 to 65% with grade 3 diastolic dysfunction. -Dopplers negative for DVT - Continue Eliquis   given newly diagnosed a-fib (no indication for antiplatelets at this time)  Dysphagia - MBS 6/11, currently on dysphagia 1 with nectar thick, advance as tolerated. - Remains with significant aphasia. - She is on dysphagia 1 with nectar thick, had another MBS study yesterday, does appear to be having silent aspirations on this diet, so she was downgraded to dysphagia 1 with thick liquid. - Discussed with husband, patient is regressing in her diet, and will need to proceed with PEG to ensure enough oral intake and low enough time for diet advancement, agreeable, IR consulted for PEG placement. - PEG successfully placed am 02/17/2024, okay to use in 24 hours   Atrial arrhythmias A-fib/flutter -Cardiology following, appreciate insight recommendations, no need for long-term heart monitor, continue Eliquis  at discharge   Hypertension: Permissive hypertension completed - Continue amlodipine , hydralazine  -metoprolol  held given bradycardia   Hyperlipidemia  - Continue rosuvastatin  and Zetia  as tolerated   Carotid artery disease - prior CABG    - Plavix/Aspirin  has been discontinued as she is currently on Eliquis  . - Will DC Coreg  given bradycardial.   Chronic diastolic CHF  > Last echo was in March with EF 60-65%, indeterminate diastolic function, normal RV function. Holding Lasix  as above   Anxiety - Depression  - Continue sertraline  if able Neuropathy  - Continue gabapentin  as able Hyperkalemia - resolved COPD   - Continue home Breo and albuterol     DVT prophylaxis: SCD's Start: 02/07/24 1625   Code Status:   Code Status: Limited: Do not attempt resuscitation (DNR) -DNR-LIMITED -Do Not Intubate/DNI   Family Communication: Husband at bedside  Status is: Inpatient  Dispo: The patient is  from: Home              Anticipated d/c is to: SNF              Anticipated d/c date is: 24 to 48 hours pending PEG tube placement and insurance approval              Patient currently not medically  stable for discharge until PEG tube has been placed  Consultants:  Neuro, Cardio  Antimicrobials:  None  Subjective: No acute issues or events overnight -currently working with physical therapy and doing quite well  Objective: Vitals:   02/16/24 1755 02/16/24 2033 02/17/24 0000 02/17/24 0400  BP: (!) 106/94 (!) 118/94 (!) 145/51 (!) 143/110  Pulse:  65    Resp:  16    Temp:  98.6 F (37 C) 98.5 F (36.9 C) 98.3 F (36.8 C)  TempSrc:  Oral Oral Oral  SpO2:      Weight:      Height:        Intake/Output Summary (Last 24 hours) at 02/17/2024 0709 Last data filed at 02/16/2024 1100 Gross per 24 hour  Intake --  Output 500 ml  Net -500 ml   Filed Weights   02/07/24 1400 02/07/24 1534  Weight: 62.4 kg 62.4 kg    Examination:  General exam: Appears calm and comfortable  Respiratory system: Clear to auscultation. Respiratory effort normal. Cardiovascular system: S1 & S2 heard, RRR. No JVD, murmurs, rubs, gallops or clicks. No pedal edema. Gastrointestinal system: Abdomen is nondistended, soft and nontender. No organomegaly or masses felt. Normal bowel sounds heard. Central nervous system: Alert and oriented. No focal neurological deficits. Extremities: Symmetric 5 x 5 power. Skin: No rashes, lesions or ulcers Psychiatry: Judgement and insight appear normal. Mood & affect appropriate.     Data Reviewed: I have personally reviewed following labs and imaging studies  CBC: Recent Labs  Lab 02/10/24 0723 02/11/24 0643 02/15/24 0955  WBC 7.2 7.8 9.5  HGB 11.0* 11.8* 11.8*  HCT 35.6* 38.8 38.1  MCV 85.8 86.8 87.2  PLT 250 251 288   Basic Metabolic Panel: Recent Labs  Lab 02/10/24 0723 02/11/24 0643 02/15/24 0826 02/16/24 0549  NA 144 144 142 140  K 3.4* 3.9 5.3* 4.8  CL 111 111 109 106  CO2 23 24 21* 27  GLUCOSE 92 97 87 97  BUN 16 17 21 20   CREATININE 0.79 0.77 0.78 0.75  CALCIUM  8.7* 8.9 8.7* 8.7*  MG  --  2.4  --   --   PHOS  --  3.3  --   --     GFR: Estimated Creatinine Clearance: 46.3 mL/min (by C-G formula based on SCr of 0.75 mg/dL). Liver Function Tests: No results for input(s): AST, ALT, ALKPHOS, BILITOT, PROT, ALBUMIN in the last 168 hours. No results for input(s): LIPASE, AMYLASE in the last 168 hours. No results for input(s): AMMONIA in the last 168 hours. Coagulation Profile: No results for input(s): INR, PROTIME in the last 168 hours. Cardiac Enzymes: No results for input(s): CKTOTAL, CKMB, CKMBINDEX, TROPONINI in the last 168 hours. BNP (last 3 results) No results for input(s): PROBNP in the last 8760 hours. HbA1C: No results for input(s): HGBA1C in the last 72 hours. CBG: No results for input(s): GLUCAP in the last 168 hours. Lipid Profile: No results for input(s): CHOL, HDL, LDLCALC, TRIG, CHOLHDL, LDLDIRECT in the last 72 hours. Thyroid  Function Tests: No results for input(s): TSH, T4TOTAL, FREET4, T3FREE, THYROIDAB  in the last 72 hours. Anemia Panel: No results for input(s): VITAMINB12, FOLATE, FERRITIN, TIBC, IRON, RETICCTPCT in the last 72 hours. Sepsis Labs: No results for input(s): PROCALCITON, LATICACIDVEN in the last 168 hours.  Recent Results (from the past 240 hours)  SARS Coronavirus 2 by RT PCR (hospital order, performed in [facility] hospital lab) *cepheid single result test* Anterior Nasal Swab     Status: None   Collection Time: 02/07/24  3:10 PM   Specimen: Anterior Nasal Swab  Result Value Ref Range Status   SARS Coronavirus 2 by RT PCR NEGATIVE NEGATIVE Final    Comment: Performed at [facility], [address], [place], [place] [address]         Radiology Studies: CT ABDOMEN WO CONTRAST Result Date: 02/16/2024 CLINICAL DATA:  Dysphasia.  gastrostomy status EXAM: CT ABDOMEN WITHOUT CONTRAST TECHNIQUE: Multidetector CT imaging of the abdomen was performed following the standard protocol without IV  contrast. RADIATION DOSE REDUCTION: This exam was performed according to the departmental dose-optimization program which includes automated exposure control, adjustment of the mA and/or kV according to patient size and/or use of iterative reconstruction technique. COMPARISON:  CT AP, 03/16/2013. Swallow evaluation, 02/15/2024. Chest XR, 11/09/2023. FINDINGS: Lower chest: Small volume BILATERAL pleural effusion. BILATERAL breast implant augmentation. Hepatobiliary: Normal noncontrast appearance of the liver without focal abnormality. Cholecystectomy. No biliary dilatation. Pancreas: No pancreatic ductal dilatation or surrounding inflammatory changes. Spleen: Normal in size without focal abnormality. Adrenals/Urinary Tract: Adrenal glands are unremarkable. RIGHT inferior pole exophytic lesion measuring a 3.0 x 2.5 cm and 19 HU, likely with a renal cyst. No follow-up is indicated. Kidneys are otherwise normal, without renal calculi or hydronephrosis. Stomach/Bowel: Stomach is within normal limits. Imaged portions of small bowel and colon are nonobstructed. Intraluminal contrast opacification of the imaged colon, previously ingested. No evidence of bowel wall thickening, distention, or inflammatory change. Vascular/Lymphatic: Aortic atherosclerosis. No enlarged abdominal lymph nodes. Other: No abdominal wall hernia or ascites. Musculoskeletal: No acute osseous findings. IMPRESSION: 1. No intervening viscera. Anatomy is amenable for percutaneous gastrostomy tube placement. 2. No acute abdominal findings. 3. Aortic Atherosclerosis (ICD10-I70.0). Additional incidental, chronic and senescent findings as above Electronically Signed   By: Art Largo M.D.   On: 02/16/2024 14:10   DG Swallowing Func-Speech Pathology Result Date: 02/15/2024 Table formatting from the original result was not included. Modified Barium Swallow Study Patient Details Name: Catherine Green MRN: [id number] Date of Birth: 07-15-1941 Today's Date:  02/15/2024 HPI/PMH: HPI: Patient is an 83 year old female admitted to [facility] with acute onset right-sided weakness.  Patient found to have left MCA CVA evolving.  Swallow and speech eval ordered.  Patient resides with her spouse and was using a walker prior to admission due to back issues per staff.  PT has prior h/o dysphagia after ACDF - that resolved per chart review.  She underwent MBS on 6/11 with no aspiration present. Patient with increased s/sx of aspiration on yesterdays (6/16) PO trials with episode of emesis. Clinical Impression: Patient presents with severe oropharyngeal dysphagia. Oral phase is characterized by significantly reduced lingual control/coordination and bolus preparation resulting in posterior spillage to the pyriform sinuses- occasionally into the trachea. Patient with deficits in bolus preparation resulting in prolonged mastication of D2 solids and oral residuals cleared through HTL wash. Pharyngeal phase is remarkable for reduced sensation and mistiming vs incomplete closure of the laryngeal vestibule and epiglottis. Patient with reduced BOT retraction resulting in BOT and vallecular residuals. These deficits  resulted in silent aspiration of thin liquids unsuccessfully cleared through cued cough and audible gross aspiration of NTL and HTL via cup. Patient with adequate airway protection during HTL via tsp, puree and D2 solids. Unable to attempt compensatory strategies as patient has global aphasia. Recommend D1/HTL via tsp with medications crushed in puree. Patient should be completely awake/alert during PO intake and receive full supervision. DIGEST Swallow Severity Rating*  Safety: 4  Efficiency: 1  Overall Pharyngeal Swallow Severity: 3 1: mild; 2: moderate; 3: severe; 4: profound *The Dynamic Imaging Grade of Swallowing Toxicity is standardized for the head and neck cancer population, however, demonstrates promising clinical applications across populations to standardize  the clinical rating of pharyngeal swallow safety and severity. Factors that may increase risk of adverse event in presence of aspiration Roderick Civatte & Jessy Morocco 2021): Factors that may increase risk of adverse event in presence of aspiration Roderick Civatte & Jessy Morocco 2021): Limited mobility; Dependence for feeding and/or oral hygiene; Weak cough; Reduced cognitive function; Frail or deconditioned; Aspiration of thick, dense, and/or acidic materials; Frequent aspiration of large volumes Recommendations/Plan: Swallowing Evaluation Recommendations Swallowing Evaluation Recommendations Recommendations: PO diet PO Diet Recommendation: Dysphagia 1 (Pureed); Moderately thick liquids (Level 3, honey thick) Liquid Administration via: Spoon Medication Administration: Crushed with puree Supervision: Full assist for feeding; Staff to assist with self-feeding; Full supervision/cueing for swallowing strategies Swallowing strategies  : Slow rate; Small bites/sips; Check for pocketing or oral holding; Check for anterior loss Postural changes: Position pt fully upright for meals; Stay upright 30-60 min after meals Oral care recommendations: Oral care BID (2x/day) Recommended consults: Consider dietitian consultation Caregiver Recommendations: Have oral suction available Treatment Plan Treatment Plan Treatment recommendations: Therapy as outlined in treatment plan below Follow-up recommendations: Skilled nursing-short term rehab (<3 hours/day) Functional status assessment: Patient has had a recent decline in their functional status and demonstrates the ability to make significant improvements in function in a reasonable and predictable amount of time. Treatment frequency: Min 2x/week Treatment duration: 2 weeks Interventions: Compensatory techniques; Aspiration precaution training; Trials of upgraded texture/liquids; Patient/family education; Diet toleration management by SLP Recommendations Recommendations for follow up therapy are one component  of a multi-disciplinary discharge planning process, led by the attending physician.  Recommendations may be updated based on patient status, additional functional criteria and insurance authorization. Assessment: Orofacial Exam: Orofacial Exam Oral Cavity: Oral Hygiene: WFL Oral Cavity - Dentition: Adequate natural dentition Oral Motor/Sensory Function: Suspected cranial nerve impairment (global aphasia) Anatomy: Anatomy: Presence of cervical hardware Boluses Administered: Boluses Administered Boluses Administered: Thin liquids (Level 0); Mildly thick liquids (Level 2, nectar thick); Moderately thick liquids (Level 3, honey thick); Puree; Solid  Oral Impairment Domain: Oral Impairment Domain Lip Closure: Interlabial escape, no progression to anterior lip Tongue control during bolus hold: Posterior escape of greater than half of bolus; Posterior escape of less than half of bolus Bolus preparation/mastication: Minimal chewing/mashing with majority of bolus unchewed Bolus transport/lingual motion: Slow tongue motion; Repetitive/disorganized tongue motion Oral residue: Residue collection on oral structures Location of oral residue : Tongue; Palate Initiation of pharyngeal swallow : Pyriform sinuses; No visible initiation at any location  Pharyngeal Impairment Domain: Pharyngeal Impairment Domain Soft palate elevation: No bolus between soft palate (SP)/pharyngeal wall (PW) Laryngeal elevation: Partial superior movement of thyroid  cartilage/partial approximation of arytenoids to epiglottic petiole Anterior hyoid excursion: Complete anterior movement Epiglottic movement: Partial inversion Laryngeal vestibule closure: Incomplete, narrow column air/contrast in laryngeal vestibule Pharyngeal stripping wave : Present - diminished Pharyngeal contraction (A/P view only): N/A  Pharyngoesophageal segment opening: Complete distension and complete duration, no obstruction of flow Tongue base retraction: Narrow column of contrast or  air between tongue base and PPW Pharyngeal residue: Trace residue within or on pharyngeal structures Location of pharyngeal residue: Tongue base; Valleculae  Esophageal Impairment Domain: Esophageal Impairment Domain Esophageal clearance upright position: -- (DNT) Pill: Pill Consistency administered: -- (DTA 2/2 aspiration risk) Penetration/Aspiration Scale Score: Penetration/Aspiration Scale Score 1.  Material does not enter airway: Puree; Solid 7.  Material enters airway, passes BELOW cords and not ejected out despite cough attempt by patient: Mildly thick liquids (Level 2, nectar thick); Moderately thick liquids (Level 3, honey thick) 8.  Material enters airway, passes BELOW cords without attempt by patient to eject out (silent aspiration) : Thin liquids (Level 0) Compensatory Strategies: Compensatory Strategies Compensatory strategies: Yes Other(comment): Ineffective (cued cough) Ineffective Other(comment): Thin liquid (Level 0)   General Information: Caregiver present: No  Diet Prior to this Study: Dysphagia 2 (finely chopped); Mildly thick liquids (Level 2, nectar thick)   No data recorded  Respiratory Status: WFL   Supplemental O2: None (Room air)   History of Recent Intubation: No  Behavior/Cognition: Alert; Doesn't follow directions Self-Feeding Abilities: Needs set-up for self-feeding Baseline vocal quality/speech: Hypophonia/low volume Volitional Cough: Able to elicit Volitional Swallow: Able to elicit No data recorded Goal Planning: Prognosis for improved oropharyngeal function: Fair Barriers to Reach Goals: Severity of deficits; Other (Comment); Language deficits No data recorded No data recorded Consulted and agree with results and recommendations: Nurse; Physician Pain: Pain Assessment Pain Assessment: Faces Faces Pain Scale: 2 Breathing: 0 Negative Vocalization: 0 Facial Expression: 0 Body Language: 0 Consolability: 0 PAINAD Score: 0 Pain Location: R knee with passive extension stretch Pain  Descriptors / Indicators: Grimacing Pain Intervention(s): Limited activity within patient's tolerance; Monitored during session End of Session: Start Time:SLP Start Time (ACUTE ONLY): 0830 Stop Time: SLP Stop Time (ACUTE ONLY): 0900 Time Calculation:SLP Time Calculation (min) (ACUTE ONLY): 30 min Charges: SLP Evaluations $ SLP Speech Visit: 1 Visit SLP Evaluations $MBS Swallow: 1 Procedure $Swallowing Treatment: 1 Procedure SLP visit diagnosis: SLP Visit Diagnosis: Dysphagia, oropharyngeal phase (R13.12) Past Medical History: Past Medical History: Diagnosis Date  Acute hypoxic respiratory failure (HCC) 11/09/2023  Acute non-recurrent maxillary sinusitis 10/28/2022  Allergy   hay fever  Anemia   Anxiety   Back pain   Carotid artery occlusion   Cerebrovascular disease   extracranial; occlusive  Chicken pox   Coronary artery disease   Depression   Dizziness   Dizziness   DVT (deep venous thrombosis) (HCC)   Fainting spell   GERD (gastroesophageal reflux disease)   Headache   Heart murmur   Hyperlipidemia   Hypertension   Leg pain   Mitral regurgitation   PONV (postoperative nausea and vomiting)   severe nausea and vomiting  Pre-syncope   PVD (peripheral vascular disease) (HCC)   endarterectomy by Dr. Shirley Douglas  Seasonal allergies   Shortness of breath dyspnea   wth ambulation at times  Swelling of both ankles   and abdomen; takes Lasix  when needed  Thrombophlebitis   following childbirth  Ulcer  Past Surgical History: Past Surgical History: Procedure Laterality Date  ANTERIOR CERVICAL DECOMP/DISCECTOMY FUSION N/A 01/11/2023  Procedure: Anterior Cervical Decompression/Discectomy Fusion Cervical Five-Cervical Six - Cervical Six-Cervical Seven;  Surgeon: Agustina Aldrich, MD;  Location: [facility];  Service: Neurosurgery;  Laterality: N/A;  3C  APPENDECTOMY    BREAST SURGERY    saline implants  CARDIAC CATHETERIZATION  CAROTID ENDARTERECTOMY  1992  CAROTID ENDARTERECTOMY Right 02/13/2005  Re-do Right CE  CATARACT EXTRACTION, BILATERAL   2013  CHOLECYSTECTOMY, LAPAROSCOPIC  04/02/2014  Dr. Hortensia Ma  CORONARY ARTERY BYPASS GRAFT  2006  x3 Dr. Matt Song  ENDARTERECTOMY Left 07/20/2014  Procedure: ENDARTERECTOMY CAROTID-LEFT;  Surgeon: Mayo Speck, MD;  Location: [facility] OR;  Service: Vascular;  Laterality: Left;  EYE SURGERY Bilateral 10/2011  Cataract Left eye  SEPTOPLASTY    with bilateral inferior turbinate reductions  SPINE SURGERY  12/2015  TOTAL ABDOMINAL HYSTERECTOMY    UPPER GASTROINTESTINAL ENDOSCOPY    had polyps removed from esophagus  UPPER GI ENDOSCOPY   Cassidi Sockwell M.A., CCC-SLP 02/15/2024, 10:48 AM  Scheduled Meds:  amLODipine   10 mg Oral Daily   bisacodyl  10 mg Rectal Once   diphenhydrAMINE   50 mg Oral Once   ezetimibe   10 mg Oral Daily   famotidine   20 mg Oral Daily   fluticasone  furoate-vilanterol  1 puff Inhalation Daily   gabapentin   400 mg Oral TID   hydrALAZINE   25 mg Oral Q6H   predniSONE   50 mg Oral Q6H   rosuvastatin   40 mg Oral Daily   sertraline   150 mg Oral Daily   sodium zirconium cyclosilicate  10 g Oral Once   Continuous Infusions:   LOS: 10 days   Time spent:  Haydee Lipa, DO [facility]  If 7PM-7AM, please contact night-coverage [email or web address]  02/17/2024, 7:09 AM

## 2024-02-17 NOTE — Progress Notes (Signed)
 Occupational Therapy Treatment Patient Details Name: Catherine Green MRN: [id number] DOB: 1941/04/25 Today's Date: 02/17/2024   History of present illness Patient is a 83 year old female admitted to [facility] on 02/07/24 with right side weakness, expressive aphasia, found to have large MCA stroke. History of HTN, GERD, CAD s/p CABG, A-fib, anemia, CHF, anxiety, depression, neuropathy, COPD, spinal stenosis s/p back surgery.   OT comments  Pt received in chair. Drowsy from procedure earlier today. Pt demonstrates improved active movement of R shoulder, elbow, forearm and wrist with emerging movement in fingers. Instructed to incorporate R hand in ADLs as an assist, currently requires hand over hand. Worked on weight bearing/shifting over extended R UE and shoulder stability. Stood x 2 with +2 min assist. Patient will benefit from continued inpatient follow up therapy, <3 hours/day.      If plan is discharge home, recommend the following:  Two people to help with walking and/or transfers;A lot of help with bathing/dressing/bathroom;Direct supervision/assist for financial management;Direct supervision/assist for medications management;Supervision due to cognitive status   Equipment Recommendations  BSC/3in1;Wheelchair (measurements OT);Wheelchair cushion (measurements OT);Hospital bed;Tub/shower seat    Recommendations for Other Services      Precautions / Restrictions Precautions Precautions: Fall Recall of Precautions/Restrictions: Impaired Required Braces or Orthoses: Splint/Cast Splint/Cast: R resting hand splint Splint/Cast - Date Prophylactic Dressing Applied (if applicable): 02/10/24 Restrictions Weight Bearing Restrictions Per Provider Order: No       Mobility Bed Mobility               General bed mobility comments: received in chair    Transfers Overall transfer level: Needs assistance Equipment used: 2 person hand held assist Transfers: Sit to/from Stand Sit to Stand: +2  physical assistance, Min assist           General transfer comment: assist to stand from chair to check periarea and for pressure relief, encouraged to push R foot down     Balance Overall balance assessment: Needs assistance   Sitting balance-Leahy Scale: Fair     Standing balance support: Bilateral upper extremity supported, Single extremity supported Standing balance-Leahy Scale: Poor                             ADL either performed or assessed with clinical judgement   ADL Overall ADL's : Needs assistance/impaired Eating/Feeding: Minimal assistance;Sitting Eating/Feeding Details (indicate cue type and reason): hand over hand assist to grasp cup with R hand while self feeding with L Grooming: Wash/dry hands;Sitting;Set up Grooming Details (indicate cue type and reason): cues for thoroughness on R side of face                                    Extremity/Trunk Assessment              Vision       Perception     Praxis     Communication Communication Communication: Impaired Factors Affecting Communication: Hearing impaired;Difficulty expressing self   Cognition Arousal: Lethargic Behavior During Therapy: WFL for tasks assessed/performed Cognition: Difficult to assess (global aphasia, receptive better than expressive) Difficult to assess due to: Impaired communication           OT - Cognition Comments: able to follow simple one step commands with multimoda cues  Following commands: Impaired Following commands impaired: Follows one step commands with increased time      Cueing   Cueing Techniques: Verbal cues, Gestural cues, Tactile cues, Visual cues  Exercises Exercises: General Upper Extremity General Exercises - Upper Extremity Shoulder Flexion: AAROM, Right, 10 reps, Seated Elbow Flexion: AAROM, Right, 10 reps, Seated Elbow Extension: AAROM, Right, 10 reps, Seated Wrist Flexion: AAROM, Right, 5  reps, Seated Other Exercises Other Exercises: R forearm rotation x 10 Other Exercises: Body on Arm, LE crossing midline to reach with elbow stabilized    Shoulder Instructions       General Comments      Pertinent Vitals/ Pain       Pain Assessment Pain Assessment: Faces Faces Pain Scale: Hurts a little bit Pain Location: abdomen Pain Descriptors / Indicators: Grimacing Pain Intervention(s): Monitored during session, Repositioned  Home Living                                          Prior Functioning/Environment              Frequency  Min 2X/week        Progress Toward Goals  OT Goals(current goals can now be found in the care plan section)  Progress towards OT goals: Progressing toward goals  Acute Rehab OT Goals OT Goal Formulation: With family Time For Goal Achievement: 02/22/24 Potential to Achieve Goals: Good  Plan      Co-evaluation                 AM-PAC OT 6 Clicks Daily Activity     Outcome Measure   Help from another person eating meals?: A Little Help from another person taking care of personal grooming?: A Lot Help from another person toileting, which includes using toliet, bedpan, or urinal?: Total Help from another person bathing (including washing, rinsing, drying)?: A Lot Help from another person to put on and taking off regular upper body clothing?: A Lot Help from another person to put on and taking off regular lower body clothing?: Total 6 Click Score: 11    End of Session    OT Visit Diagnosis: Unsteadiness on feet (R26.81);Other abnormalities of gait and mobility (R26.89);Muscle weakness (generalized) (M62.81);Hemiplegia and hemiparesis Hemiplegia - Right/Left: Right Hemiplegia - dominant/non-dominant: Dominant Hemiplegia - caused by: Cerebral infarction   Activity Tolerance Patient tolerated treatment well   Patient Left in chair;with call bell/phone within reach;with chair alarm set;with  family/visitor present   Nurse Communication          Time: 7829-5621 OT Time Calculation (min): 24 min  Charges: OT General Charges $OT Visit: 1 Visit OT Treatments $Self Care/Home Management : 8-22 mins $Neuromuscular Re-education: 8-22 mins  Avanell Leigh, OTR/L Acute Rehabilitation Services Office: [phone]   Jonette Nestle 02/17/2024, 3:25 PM

## 2024-02-17 NOTE — Progress Notes (Signed)
 Speech Language Pathology Treatment: Dysphagia  Patient Details Name: Catherine Green MRN: [id number] DOB: May 27, 1941 Today's Date: 02/17/2024 Time: 1410-1445 SLP Time Calculation (min) (ACUTE ONLY): 35 min  Assessment / Plan / Recommendation Clinical Impression  Patient seen by SLP for skilled treatment focused on dysphagia goals and aphasia goals. Her spouse was in the room as well. She had PEG placed in IR today and is still a little drowsy but was alert, sitting in recliner chair. She opened mouth with verbal command but required SLP modeling for other actions. Eye contact with SLP was appropriate. She required verbal cues and hand over hand cues to initiate to point to make a choice between two food items (pudding and thickened juice). There was one instance of her seeming to mouth words but no vocalizations heard. She did not attempt to initiate any verbal or nonverbal communication.  SLP also worked with patient on dysphagia goals, feeding her PO's of chocolate pudding and honey thick juice by spoon. Patient readily accepted PO's into oral cavity, exhibited only trace amount of anterior spillage from corner of right side of mouth and no observed PO residuals. Swallow initiation was delayed but no overt s/s aspiration observed. SLP recommending to restart Dys 1 (puree) solids, honey thick liquids. SLP will continue to follow.   HPI HPI: Patient is an 83 year old female admitted to [facility] with acute onset right-sided weakness.  Patient found to have left MCA CVA evolving.  Swallow and speech eval ordered.  Patient resides with her spouse and was using a walker prior to admission due to back issues per staff.  PT has prior h/o dysphagia after ACDF - that resolved per chart review.  She underwent MBS on 6/11 with no aspiration present. Patient with increased s/sx of aspiration on yesterdays (6/16) PO trials with episode of emesis.      SLP Plan  Continue with current plan of care           Recommendations  Diet recommendations: Dysphagia 1 (puree);Honey-thick liquid Liquids provided via: Teaspoon Medication Administration: Crushed with puree Supervision: Full supervision/cueing for compensatory strategies;Staff to assist with self feeding Compensations: Slow rate;Small sips/bites;Minimize environmental distractions Postural Changes and/or Swallow Maneuvers: Seated upright 90 degrees;Upright 30-60 min after meal                  Oral care BID   Frequent or constant Supervision/Assistance Dysphagia, oropharyngeal phase (R13.12)     Continue with current plan of care     Jacqualine Mater, MA, CCC-SLP Speech Therapy

## 2024-02-18 DIAGNOSIS — D649 Anemia, unspecified: Secondary | ICD-10-CM | POA: Diagnosis not present

## 2024-02-18 DIAGNOSIS — I110 Hypertensive heart disease with heart failure: Secondary | ICD-10-CM | POA: Diagnosis not present

## 2024-02-18 DIAGNOSIS — I4891 Unspecified atrial fibrillation: Secondary | ICD-10-CM | POA: Diagnosis not present

## 2024-02-18 DIAGNOSIS — R1312 Dysphagia, oropharyngeal phase: Secondary | ICD-10-CM | POA: Diagnosis not present

## 2024-02-18 DIAGNOSIS — R404 Transient alteration of awareness: Secondary | ICD-10-CM | POA: Diagnosis not present

## 2024-02-18 DIAGNOSIS — E785 Hyperlipidemia, unspecified: Secondary | ICD-10-CM | POA: Diagnosis not present

## 2024-02-18 DIAGNOSIS — Z7984 Long term (current) use of oral hypoglycemic drugs: Secondary | ICD-10-CM | POA: Diagnosis not present

## 2024-02-18 DIAGNOSIS — I69320 Aphasia following cerebral infarction: Secondary | ICD-10-CM | POA: Diagnosis not present

## 2024-02-18 DIAGNOSIS — Z981 Arthrodesis status: Secondary | ICD-10-CM | POA: Diagnosis not present

## 2024-02-18 DIAGNOSIS — Z7901 Long term (current) use of anticoagulants: Secondary | ICD-10-CM | POA: Diagnosis not present

## 2024-02-18 DIAGNOSIS — Z7401 Bed confinement status: Secondary | ICD-10-CM | POA: Diagnosis not present

## 2024-02-18 DIAGNOSIS — I5032 Chronic diastolic (congestive) heart failure: Secondary | ICD-10-CM | POA: Diagnosis not present

## 2024-02-18 DIAGNOSIS — E1140 Type 2 diabetes mellitus with diabetic neuropathy, unspecified: Secondary | ICD-10-CM | POA: Diagnosis not present

## 2024-02-18 DIAGNOSIS — K219 Gastro-esophageal reflux disease without esophagitis: Secondary | ICD-10-CM | POA: Diagnosis not present

## 2024-02-18 DIAGNOSIS — Z743 Need for continuous supervision: Secondary | ICD-10-CM | POA: Diagnosis not present

## 2024-02-18 DIAGNOSIS — M48061 Spinal stenosis, lumbar region without neurogenic claudication: Secondary | ICD-10-CM | POA: Diagnosis not present

## 2024-02-18 DIAGNOSIS — F411 Generalized anxiety disorder: Secondary | ICD-10-CM | POA: Diagnosis not present

## 2024-02-18 DIAGNOSIS — F32A Depression, unspecified: Secondary | ICD-10-CM | POA: Diagnosis not present

## 2024-02-18 DIAGNOSIS — I509 Heart failure, unspecified: Secondary | ICD-10-CM | POA: Diagnosis not present

## 2024-02-18 DIAGNOSIS — F419 Anxiety disorder, unspecified: Secondary | ICD-10-CM | POA: Diagnosis not present

## 2024-02-18 DIAGNOSIS — I2510 Atherosclerotic heart disease of native coronary artery without angina pectoris: Secondary | ICD-10-CM | POA: Diagnosis not present

## 2024-02-18 DIAGNOSIS — I63412 Cerebral infarction due to embolism of left middle cerebral artery: Secondary | ICD-10-CM | POA: Diagnosis not present

## 2024-02-18 DIAGNOSIS — J449 Chronic obstructive pulmonary disease, unspecified: Secondary | ICD-10-CM | POA: Diagnosis not present

## 2024-02-18 DIAGNOSIS — R110 Nausea: Secondary | ICD-10-CM | POA: Diagnosis not present

## 2024-02-18 DIAGNOSIS — I69351 Hemiplegia and hemiparesis following cerebral infarction affecting right dominant side: Secondary | ICD-10-CM | POA: Diagnosis not present

## 2024-02-18 DIAGNOSIS — Z931 Gastrostomy status: Secondary | ICD-10-CM | POA: Diagnosis not present

## 2024-02-18 DIAGNOSIS — F331 Major depressive disorder, recurrent, moderate: Secondary | ICD-10-CM | POA: Diagnosis not present

## 2024-02-18 DIAGNOSIS — I639 Cerebral infarction, unspecified: Secondary | ICD-10-CM | POA: Diagnosis not present

## 2024-02-18 DIAGNOSIS — I69391 Dysphagia following cerebral infarction: Secondary | ICD-10-CM | POA: Diagnosis not present

## 2024-02-18 DIAGNOSIS — M6281 Muscle weakness (generalized): Secondary | ICD-10-CM | POA: Diagnosis not present

## 2024-02-18 DIAGNOSIS — Z951 Presence of aortocoronary bypass graft: Secondary | ICD-10-CM | POA: Diagnosis not present

## 2024-02-18 DIAGNOSIS — E782 Mixed hyperlipidemia: Secondary | ICD-10-CM | POA: Diagnosis not present

## 2024-02-18 LAB — BASIC METABOLIC PANEL WITH GFR
Anion gap: 11 (ref 5–15)
BUN: 27 mg/dL — ABNORMAL HIGH (ref 8–23)
CO2: 25 mmol/L (ref 22–32)
Calcium: 8.8 mg/dL — ABNORMAL LOW (ref 8.9–10.3)
Chloride: 107 mmol/L (ref 98–111)
Creatinine, Ser: 0.84 mg/dL (ref 0.44–1.00)
GFR, Estimated: >60 mL/min (ref >60)
Glucose, Bld: 122 mg/dL — ABNORMAL HIGH (ref 70–99)
Potassium: 4 mmol/L (ref 3.5–5.1)
Sodium: 143 mmol/L (ref 135–145)

## 2024-02-18 LAB — CBC
HCT: 35.4 % — ABNORMAL LOW (ref 36.0–46.0)
Hemoglobin: 10.9 g/dL — ABNORMAL LOW (ref 12.0–15.0)
MCH: 26.7 pg (ref 26.0–34.0)
MCHC: 30.8 g/dL (ref 30.0–36.0)
MCV: 86.8 fL (ref 80.0–100.0)
Platelets: 325 10*3/uL (ref 150–400)
RBC: 4.08 MIL/uL (ref 3.87–5.11)
RDW: 13.3 % (ref 11.5–15.5)
WBC: 11.7 10*3/uL — ABNORMAL HIGH (ref 4.0–10.5)
nRBC: 0 % (ref 0.0–0.2)

## 2024-02-18 MED ORDER — ADULT MULTIVITAMIN W/MINERALS CH: 1.0000 | ORAL_TABLET | Freq: Every day | ORAL | 0 refills | Status: DC

## 2024-02-18 MED ORDER — HYDRALAZINE HCL 25 MG PO TABS: 25.0000 mg | ORAL_TABLET | Freq: Four times a day (QID) | ORAL | 0 refills | Status: DC

## 2024-02-18 MED ORDER — APIXABAN (ELIQUIS) VTE STARTER PACK (10MG AND 5MG): ORAL_TABLET | ORAL | 0 refills | Status: DC

## 2024-02-18 MED ORDER — SERTRALINE HCL 50 MG PO TABS: 150.0000 mg | ORAL_TABLET | Freq: Every day | ORAL | 1 refills | Status: DC

## 2024-02-18 MED ORDER — FOOD THICKENER (SIMPLYTHICK): 1.0000 | ORAL | 1 refills | Status: DC | PRN

## 2024-02-18 MED ORDER — ROSUVASTATIN CALCIUM 40 MG PO TABS
40.0000 mg | ORAL_TABLET | Freq: Every day | ORAL | 0 refills | Status: DC
Start: 2024-02-18 — End: 2024-03-16

## 2024-02-18 MED ORDER — JEVITY 1.2 CAL PO LIQD: 1000.0000 mL | ORAL | 0 refills | Status: DC

## 2024-02-18 MED ORDER — FAMOTIDINE 20 MG PO TABS: 20.0000 mg | ORAL_TABLET | Freq: Every day | ORAL | 0 refills | Status: DC

## 2024-02-18 NOTE — Discharge Summary (Signed)
 Physician Discharge Summary  Catherine Green RJJ:[id number] DOB: 1941-06-24 DOA: 02/07/2024  PCP: Donnie Galea, MD  Admit date: 02/07/2024 Discharge date: 02/18/2024  Admitted From: Home Disposition:  Home  Recommendations for Outpatient Follow-up:  Follow up with PCP in 1-2 weeks  Discharge Condition: Stable CODE STATUS: DNR Diet recommendation: Low-salt low-fat low-carb diet  Brief/Interim Summary: Catherine Green is an 83 y.o. female with medical history significant of hypertension, hyperlipidemia, GERD, carotid artery disease, CAD status post CABG, atrial fibrillation, anemia, chronic diastolic CHF, anxiety, depression, neuropathy, COPD, spinal stenosis presened with right-sided weakness and expressive aphasia evaluation of large left MCA stroke.  She had significant dysphagia and aphasia, gradually improving, advance to dysphagia 1 with nectar thick liquid diet, has been doing okay over the weekend, she was reassessed again on Monday given significant regurgitation of fluid intake, MBS continues to be significant for silent aspiration, and her liquid has been changed to honey thick liquid, at this point she will need PEG tube to ensure safe route for medication and calorie intake.  Husband agreeable.  IR evaluating for possible PEG tube placement in the next 24 to 48 hours.  Patient admitted as above with large left-sided MCA infarct with residual deficits including weakness and dysphagia, given patient's inability to tolerate p.o. safely PEG tube has been placed by IR on 02/17/2024 without complications.  She is otherwise recovering from her stroke, plan to discharge patient to skilled nursing facility for ongoing therapy, agreeable for husband and otherwise stable for discharge.  Medication changes as below, continue anticoagulation in the setting of A-fib flutter, no antiplatelets per discussion with neurology and cardiology.  Resume home medications via PEG tube, hopefully patient's p.o. intake  improves enough that the PEG tube can be removed which is ultimately the goal per her husband.  Discharge to SNF pending transport at this time.  Discharge Diagnoses:  Principal Problem:   Acute CVA (cerebrovascular accident) (HCC) Active Problems:   Essential hypertension   CAD, ARTERY BYPASS GRAFT   Anxiety and depression   Hyperlipidemia   Chronic diastolic CHF (congestive heart failure) (HCC)   Carotid stenosis   Spinal stenosis of lumbar region   Neuropathy   GERD (gastroesophageal reflux disease)   Anemia   Chronic obstructive pulmonary disease with acute exacerbation (HCC)   Atrial fibrillation (HCC)  Large acute MCA territory infarct/CVA  - Patient presents with right-sided weakness R more than leg, expressive aphasia, right-sided hemineglect.  - CT L MCA infarct with left MCA hyperdense sign - CTA head and neck left ICA occluded at siphon, as well as left MCA and ACA occlusion - 2D echo EF 60 to 65% with grade 3 diastolic dysfunction. - Dopplers negative for DVT - Continue Eliquis  given newly diagnosed a-fib (no indication for antiplatelets at this time)   Dysphagia - MBS 6/11, currently on dysphagia 1 with nectar thick, advance as tolerated. - Remains with significant aphasia. - She is on dysphagia 1 with nectar thick, had another MBS study yesterday, does appear to be having silent aspirations on this diet, so she was downgraded to dysphagia 1 with thick liquid. - Discussed with husband, patient is regressing in her diet, and will need to proceed with PEG to ensure enough oral intake and low enough time for diet advancement, agreeable, IR consulted for PEG placement. - PEG successfully placed am 02/17/2024 -okay to use, tolerating feeds today   Atrial arrhythmias A-fib/flutter -Cardiology following, appreciate insight recommendations, no need for long-term heart monitor, continue Eliquis   at discharge   Hypertension: Permissive hypertension completed - Continue  amlodipine , hydralazine  -Metoprolol  discontinued in setting of bradycardia   Hyperlipidemia  - Continue rosuvastatin  and Zetia  as tolerated   Carotid artery disease - prior CABG    - Plavix/Aspirin  has been discontinued as she is currently on Eliquis  . - Will DC Coreg  given bradycardial.   Chronic diastolic CHF  > Last echo was in March with EF 60-65%, indeterminate diastolic function, normal RV function. Holding Lasix  as above   Anxiety - Depression  - Continue sertraline  if able Neuropathy  - Continue gabapentin  as able Hyperkalemia - resolved COPD   - Continue home Breo and albuterol     Discharge Instructions  Discharge Instructions     Ambulatory referral to Neurology   Complete by: As directed    Follow up with Dr. Genita Keys at [facility] in 4-6 weeks.  Patient is Dr. Denney Fisherman patient. Thanks.      Allergies as of 02/18/2024       Reactions   Lyrica  [pregabalin ] Shortness Of Breath, Swelling, Other (See Comments)   Chest tightness, also   Hydrocodone  Other (See Comments)   Hallucinations   Ivp Dye [iodinated Contrast Media] Hives, Itching       Lipitor [atorvastatin] Nausea And Vomiting   Methylprednisolone  Other (See Comments)   Severe weekness    Oxycodone  Hcl Other (See Comments)   Hallucinations    Penicillin G Nausea And Vomiting   Shellfish Allergy Hives, Itching   Buspar  [buspirone ] Other (See Comments)   Worsening mood   Clavulanic Acid Other (See Comments)   Reaction not noted   Prednisone  Other (See Comments)   Reaction not noted   Codeine Nausea And Vomiting, Other (See Comments)   Cymbalta  [duloxetine  Hcl] Nausea Only   GI upset at 30mg    Demerol Nausea And Vomiting   Doxycycline  Nausea Only, Other (See Comments)   Weakness, sick to her stomach   Effexor  [venlafaxine ] Nausea Only   Hydrochlorothiazide  Other (See Comments)   Not an allergy but urinary frequency and sweating at 25mg    Hydroxyzine  Other (See Comments)   Excessive sweating.    Metoprolol   Other (See Comments)   Slowed body down, per pt   Protonix  [pantoprazole  Sodium] Nausea Only   Trazodone  And Nefazodone Other (See Comments)   Sedation         Medication List     STOP taking these medications    aspirin  81 MG tablet   furosemide  40 MG tablet Commonly known as: LASIX    metoprolol  succinate 25 MG 24 hr tablet Commonly known as: TOPROL -XL   omeprazole  20 MG capsule Commonly known as: PRILOSEC   potassium chloride  SA 20 MEQ tablet Commonly known as: KLOR-CON  M       TAKE these medications    albuterol  108 (90 Base) MCG/ACT inhaler Commonly known as: VENTOLIN  HFA Inhale 2 puffs into the lungs every 6 (six) hours as needed for wheezing or shortness of breath.   amLODipine  2.5 MG tablet Commonly known as: NORVASC  Take 1 tablet (2.5 mg total) by mouth in the morning.   Apixaban  Starter Pack (10mg  and 5mg ) Commonly known as: ELIQUIS  STARTER PACK Take as directed on package: start with two-5mg  tablets twice daily for 7 days. On day 8, switch to one-5mg  tablet twice daily.   Breo Ellipta  100-25 MCG/ACT Aepb Generic drug: fluticasone  furoate-vilanterol Inhale 1 puff into the lungs daily.   Co Q10 100 MG Caps Take 100 mg by mouth daily.   cyanocobalamin   1000 MCG tablet Commonly known as: VITAMIN B12 Take 1 tablet (1,000 mcg total) by mouth daily.   empagliflozin  10 MG Tabs tablet Commonly known as: JARDIANCE  Take 1 tablet (10 mg total) by mouth daily before breakfast.   ezetimibe  10 MG tablet Commonly known as: ZETIA  Take 1 tablet (10 mg total) by mouth daily.   famotidine  20 MG tablet Commonly known as: PEPCID  Take 1 tablet (20 mg total) by mouth daily.   feeding supplement (JEVITY 1.2 CAL) Liqd Place 1,000 mLs into feeding tube continuous.   food thickener Gel Commonly known as: SIMPLYTHICK (NECTAR/LEVEL 2/MILDLY THICK) Take 1 packet by mouth as needed.   gabapentin  400 MG capsule Commonly known as: NEURONTIN  Take 1 capsule (400 mg  total) by mouth 3 (three) times daily.   hydrALAZINE  25 MG tablet Commonly known as: APRESOLINE  Take 1 tablet (25 mg total) by mouth every 6 (six) hours.   ipratropium 0.03 % nasal spray Commonly known as: ATROVENT Place 2 sprays into both nostrils 2 (two) times daily as needed for rhinitis.   loratadine  10 MG tablet Commonly known as: CLARITIN  Take 10 mg by mouth daily.   multivitamin with minerals Tabs tablet Place 1 tablet into feeding tube daily.   polyethylene glycol powder 17 GM/SCOOP powder Commonly known as: GLYCOLAX /MIRALAX  Take 17 g by mouth 2 (two) times daily as needed.   rosuvastatin  40 MG tablet Commonly known as: CRESTOR  Take 1 tablet (40 mg total) by mouth daily. What changed:  medication strength how much to take   sertraline  50 MG tablet Commonly known as: ZOLOFT  Take 3 tablets (150 mg total) by mouth daily. What changed:  medication strength See the new instructions.        Contact information for follow-up providers     Ellene Gustin, MD. Schedule an appointment as soon as possible for a visit in 1 month(s).   Specialty: Neurology Contact information: [address] [place] [address] [phone]              Contact information for after-discharge care     Destination     [facility] .   Service: Skilled Nursing Contact information: [address] [place]  [address] [phone]                    Allergies  Allergen Reactions   Lyrica  [Pregabalin ] Shortness Of Breath, Swelling and Other (See Comments)    Chest tightness, also    Hydrocodone  Other (See Comments)    Hallucinations    Ivp Dye [Iodinated Contrast Media] Hives and Itching        Lipitor [Atorvastatin] Nausea And Vomiting   Methylprednisolone  Other (See Comments)    Severe weekness    Oxycodone  Hcl Other (See Comments)    Hallucinations    Penicillin  G Nausea And Vomiting   Shellfish Allergy Hives and Itching   Buspar  [Buspirone ] Other (See Comments)    Worsening mood   Clavulanic Acid Other (See Comments)    Reaction not noted   Prednisone  Other (See Comments)    Reaction not noted   Codeine Nausea And Vomiting and Other (See Comments)   Cymbalta  [Duloxetine  Hcl] Nausea Only    GI upset at 30mg    Demerol Nausea And Vomiting   Doxycycline  Nausea Only and Other (See Comments)    Weakness, sick to her stomach   Effexor  [Venlafaxine ] Nausea Only   Hydrochlorothiazide  Other (  See Comments)    Not an allergy but urinary frequency and sweating at 25mg    Hydroxyzine  Other (See Comments)    Excessive sweating.    Metoprolol  Other (See Comments)    Slowed body down, per pt    Protonix  [Pantoprazole  Sodium] Nausea Only   Trazodone  And Nefazodone Other (See Comments)    Sedation     Consultations: Neuro, cardio  Procedures/Studies: IR GASTROSTOMY TUBE MOD SED Result Date: 02/17/2024 CLINICAL DATA:  Cerebral infarction, dysphagia and need for gastrostomy tube for nutritional needs. EXAM: PERCUTANEOUS GASTROSTOMY TUBE PLACEMENT ANESTHESIA/SEDATION: Moderate (conscious) sedation was employed during this procedure. A total of Versed  1.0 mg and Fentanyl  50 mcg was administered intravenously. Moderate Sedation Time: 16 minutes. The patient's level of consciousness and vital signs were monitored continuously by radiology nursing throughout the procedure under my direct supervision. CONTRAST:  15mL OMNIPAQUE  IOHEXOL  300 MG/ML  SOLN MEDICATIONS: 2 g IV Ancef . IV antibiotic was administered in an appropriate time interval prior to needle puncture of the skin. The patient also received 1 g IV glucagon during the procedure. FLUOROSCOPY: Radiation Exposure Index: 45.2 mGy Kerma. PROCEDURE: The procedure, risks, benefits, and alternatives were explained to the patient's husband. Questions regarding the procedure were encouraged and answered. The  patient's husband understands and consents to the procedure. A timeout was performed prior to initiating the procedure. A 5-French catheter was then advanced through the patient's mouth under fluoroscopy into the esophagus and to the level of the stomach. This catheter was used to insufflate the stomach with air under fluoroscopy. The abdominal wall was prepped with chlorhexidine  in a sterile fashion, and a sterile drape was applied covering the operative field. A sterile gown and sterile gloves were used for the procedure. Local anesthesia was provided with 1% Lidocaine . A skin incision was made in the upper abdominal wall. Under fluoroscopy, an 18 gauge trocar needle was advanced into the stomach. Contrast injection was performed to confirm intraluminal position of the needle tip. A single T tack was then deployed in the lumen of the stomach. This was brought up to tension at the skin surface. Over a guidewire, a 9-French sheath was advanced into the lumen of the stomach. The wire was left in place as a safety wire. A loop snare device from a percutaneous gastrostomy kit was then advanced into the stomach. A floppy guide wire was advanced through the orogastric catheter under fluoroscopy in the stomach. The loop snare advanced through the percutaneous gastric access was used to snare the guide wire. This allowed withdrawal of the loop snare out of the patient's mouth by retraction of the orogastric catheter and wire. A 20-French bumper retention gastrostomy tube was looped around the snare device. It was then pulled back through the patient's mouth. The retention bumper was brought up to the anterior gastric wall. The T tack suture was cut at the skin. The exiting gastrostomy tube was cut to appropriate length and a feeding adapter applied. The catheter was injected with contrast material to confirm position and a fluoroscopic spot image saved. The tube was then flushed with saline. A dressing was applied over the  gastrostomy exit site. COMPLICATIONS: None. FINDINGS: The stomach distended well with air allowing safe placement of the gastrostomy tube. After placement, the tip of the gastrostomy tube lies in the body of the stomach. IMPRESSION: Percutaneous gastrostomy with placement of a 20-French bumper retention tube in the body of the stomach. This tube can be used for percutaneous feeds beginning in 24  hours after placement. Electronically Signed   By: Erica Hau M.D.   On: 02/17/2024 10:44   CT ABDOMEN WO CONTRAST Result Date: 02/16/2024 CLINICAL DATA:  Dysphasia.  gastrostomy status EXAM: CT ABDOMEN WITHOUT CONTRAST TECHNIQUE: Multidetector CT imaging of the abdomen was performed following the standard protocol without IV contrast. RADIATION DOSE REDUCTION: This exam was performed according to the departmental dose-optimization program which includes automated exposure control, adjustment of the mA and/or kV according to patient size and/or use of iterative reconstruction technique. COMPARISON:  CT AP, 03/16/2013. Swallow evaluation, 02/15/2024. Chest XR, 11/09/2023. FINDINGS: Lower chest: Small volume BILATERAL pleural effusion. BILATERAL breast implant augmentation. Hepatobiliary: Normal noncontrast appearance of the liver without focal abnormality. Cholecystectomy. No biliary dilatation. Pancreas: No pancreatic ductal dilatation or surrounding inflammatory changes. Spleen: Normal in size without focal abnormality. Adrenals/Urinary Tract: Adrenal glands are unremarkable. RIGHT inferior pole exophytic lesion measuring a 3.0 x 2.5 cm and 19 HU, likely with a renal cyst. No follow-up is indicated. Kidneys are otherwise normal, without renal calculi or hydronephrosis. Stomach/Bowel: Stomach is within normal limits. Imaged portions of small bowel and colon are nonobstructed. Intraluminal contrast opacification of the imaged colon, previously ingested. No evidence of bowel wall thickening, distention, or inflammatory  change. Vascular/Lymphatic: Aortic atherosclerosis. No enlarged abdominal lymph nodes. Other: No abdominal wall hernia or ascites. Musculoskeletal: No acute osseous findings. IMPRESSION: 1. No intervening viscera. Anatomy is amenable for percutaneous gastrostomy tube placement. 2. No acute abdominal findings. 3. Aortic Atherosclerosis (ICD10-I70.0). Additional incidental, chronic and senescent findings as above Electronically Signed   By: Art Largo M.D.   On: 02/16/2024 14:10   DG Swallowing Func-Speech Pathology Result Date: 02/15/2024 Table formatting from the original result was not included. Modified Barium Swallow Study Patient Details Name: COLETTE DICAMILLO MRN: [id number] Date of Birth: [birth year] Today's Date: 02/15/2024 HPI/PMH: HPI: Patient is an 83 year old female admitted [facility] with acute onset right-sided weakness.  Patient found to have left MCA CVA evolving.  Swallow and speech eval ordered.  Patient resides with her spouse and was using a walker prior to admission due to back issues per staff.  PT has prior h/o dysphagia after ACDF - that resolved per chart review.  She underwent MBS on 6/11 with no aspiration present. Patient with increased s/sx of aspiration on yesterdays (6/16) PO trials with episode of emesis. Clinical Impression: Patient presents with severe oropharyngeal dysphagia. Oral phase is characterized by significantly reduced lingual control/coordination and bolus preparation resulting in posterior spillage to the pyriform sinuses- occasionally into the trachea. Patient with deficits in bolus preparation resulting in prolonged mastication of D2 solids and oral residuals cleared through HTL wash. Pharyngeal phase is remarkable for reduced sensation and mistiming vs incomplete closure of the laryngeal vestibule and epiglottis. Patient with reduced BOT retraction resulting in BOT and vallecular residuals. These deficits resulted in silent aspiration of thin liquids  unsuccessfully cleared through cued cough and audible gross aspiration of NTL and HTL via cup. Patient with adequate airway protection during HTL via tsp, puree and D2 solids. Unable to attempt compensatory strategies as patient has global aphasia. Recommend D1/HTL via tsp with medications crushed in puree. Patient should be completely awake/alert during PO intake and receive full supervision. DIGEST Swallow Severity Rating*  Safety: 4  Efficiency: 1  Overall Pharyngeal Swallow Severity: 3 1: mild; 2: moderate; 3: severe; 4: profound *The Dynamic Imaging Grade of Swallowing Toxicity is standardized for the head and neck cancer population, however, demonstrates promising clinical  applications across populations to standardize the clinical rating of pharyngeal swallow safety and severity. Factors that may increase risk of adverse event in presence of aspiration Roderick Civatte & Jessy Morocco 2021): Factors that may increase risk of adverse event in presence of aspiration Roderick Civatte & Jessy Morocco 2021): Limited mobility; Dependence for feeding and/or oral hygiene; Weak cough; Reduced cognitive function; Frail or deconditioned; Aspiration of thick, dense, and/or acidic materials; Frequent aspiration of large volumes Recommendations/Plan: Swallowing Evaluation Recommendations Swallowing Evaluation Recommendations Recommendations: PO diet PO Diet Recommendation: Dysphagia 1 (Pureed); Moderately thick liquids (Level 3, honey thick) Liquid Administration via: Spoon Medication Administration: Crushed with puree Supervision: Full assist for feeding; Staff to assist with self-feeding; Full supervision/cueing for swallowing strategies Swallowing strategies  : Slow rate; Small bites/sips; Check for pocketing or oral holding; Check for anterior loss Postural changes: Position pt fully upright for meals; Stay upright 30-60 min after meals Oral care recommendations: Oral care BID (2x/day) Recommended consults: Consider dietitian consultation Caregiver  Recommendations: Have oral suction available Treatment Plan Treatment Plan Treatment recommendations: Therapy as outlined in treatment plan below Follow-up recommendations: Skilled nursing-short term rehab (<3 hours/day) Functional status assessment: Patient has had a recent decline in their functional status and demonstrates the ability to make significant improvements in function in a reasonable and predictable amount of time. Treatment frequency: Min 2x/week Treatment duration: 2 weeks Interventions: Compensatory techniques; Aspiration precaution training; Trials of upgraded texture/liquids; Patient/family education; Diet toleration management by SLP Recommendations Recommendations for follow up therapy are one component of a multi-disciplinary discharge planning process, led by the attending physician.  Recommendations may be updated based on patient status, additional functional criteria and insurance authorization. Assessment: Orofacial Exam: Orofacial Exam Oral Cavity: Oral Hygiene: WFL Oral Cavity - Dentition: Adequate natural dentition Oral Motor/Sensory Function: Suspected cranial nerve impairment (global aphasia) Anatomy: Anatomy: Presence of cervical hardware Boluses Administered: Boluses Administered Boluses Administered: Thin liquids (Level 0); Mildly thick liquids (Level 2, nectar thick); Moderately thick liquids (Level 3, honey thick); Puree; Solid  Oral Impairment Domain: Oral Impairment Domain Lip Closure: Interlabial escape, no progression to anterior lip Tongue control during bolus hold: Posterior escape of greater than half of bolus; Posterior escape of less than half of bolus Bolus preparation/mastication: Minimal chewing/mashing with majority of bolus unchewed Bolus transport/lingual motion: Slow tongue motion; Repetitive/disorganized tongue motion Oral residue: Residue collection on oral structures Location of oral residue : Tongue; Palate Initiation of pharyngeal swallow : Pyriform sinuses;  No visible initiation at any location  Pharyngeal Impairment Domain: Pharyngeal Impairment Domain Soft palate elevation: No bolus between soft palate (SP)/pharyngeal wall (PW) Laryngeal elevation: Partial superior movement of thyroid  cartilage/partial approximation of arytenoids to epiglottic petiole Anterior hyoid excursion: Complete anterior movement Epiglottic movement: Partial inversion Laryngeal vestibule closure: Incomplete, narrow column air/contrast in laryngeal vestibule Pharyngeal stripping wave : Present - diminished Pharyngeal contraction (A/P view only): N/A Pharyngoesophageal segment opening: Complete distension and complete duration, no obstruction of flow Tongue base retraction: Narrow column of contrast or air between tongue base and PPW Pharyngeal residue: Trace residue within or on pharyngeal structures Location of pharyngeal residue: Tongue base; Valleculae  Esophageal Impairment Domain: Esophageal Impairment Domain Esophageal clearance upright position: -- (DNT) Pill: Pill Consistency administered: -- (DTA 2/2 aspiration risk) Penetration/Aspiration Scale Score: Penetration/Aspiration Scale Score 1.  Material does not enter airway: Puree; Solid 7.  Material enters airway, passes BELOW cords and not ejected out despite cough attempt by patient: Mildly thick liquids (Level 2, nectar thick); Moderately thick liquids (Level 3, honey thick)  8.  Material enters airway, passes BELOW cords without attempt by patient to eject out (silent aspiration) : Thin liquids (Level 0) Compensatory Strategies: Compensatory Strategies Compensatory strategies: Yes Other(comment): Ineffective (cued cough) Ineffective Other(comment): Thin liquid (Level 0)   General Information: Caregiver present: No  Diet Prior to this Study: Dysphagia 2 (finely chopped); Mildly thick liquids (Level 2, nectar thick)   No data recorded  Respiratory Status: WFL   Supplemental O2: None (Room air)   History of Recent Intubation: No   Behavior/Cognition: Alert; Doesn't follow directions Self-Feeding Abilities: Needs set-up for self-feeding Baseline vocal quality/speech: Hypophonia/low volume Volitional Cough: Able to elicit Volitional Swallow: Able to elicit No data recorded Goal Planning: Prognosis for improved oropharyngeal function: Fair Barriers to Reach Goals: Severity of deficits; Other (Comment); Language deficits No data recorded No data recorded Consulted and agree with results and recommendations: Nurse; Physician Pain: Pain Assessment Pain Assessment: Faces Faces Pain Scale: 2 Breathing: 0 Negative Vocalization: 0 Facial Expression: 0 Body Language: 0 Consolability: 0 PAINAD Score: 0 Pain Location: R knee with passive extension stretch Pain Descriptors / Indicators: Grimacing Pain Intervention(s): Limited activity within patient's tolerance; Monitored during session End of Session: Start Time:SLP Start Time (ACUTE ONLY): 0830 Stop Time: SLP Stop Time (ACUTE ONLY): 0900 Time Calculation:SLP Time Calculation (min) (ACUTE ONLY): 30 min Charges: SLP Evaluations $ SLP Speech Visit: 1 Visit SLP Evaluations $MBS Swallow: 1 Procedure $Swallowing Treatment: 1 Procedure SLP visit diagnosis: SLP Visit Diagnosis: Dysphagia, oropharyngeal phase (R13.12) Past Medical History: Past Medical History: Diagnosis Date  Acute hypoxic respiratory failure (HCC) 11/09/2023  Acute non-recurrent maxillary sinusitis 10/28/2022  Allergy   hay fever  Anemia   Anxiety   Back pain   Carotid artery occlusion   Cerebrovascular disease   extracranial; occlusive  Chicken pox   Coronary artery disease   Depression   Dizziness   Dizziness   DVT (deep venous thrombosis) (HCC)   Fainting spell   GERD (gastroesophageal reflux disease)   Headache   Heart murmur   Hyperlipidemia   Hypertension   Leg pain   Mitral regurgitation   PONV (postoperative nausea and vomiting)   severe nausea and vomiting  Pre-syncope   PVD (peripheral vascular disease) (HCC)   endarterectomy by Dr.  Shirley Douglas  Seasonal allergies   Shortness of breath dyspnea   wth ambulation at times  Swelling of both ankles   and abdomen; takes Lasix  when needed  Thrombophlebitis   following childbirth  Ulcer  Past Surgical History: Past Surgical History: Procedure Laterality Date  ANTERIOR CERVICAL DECOMP/DISCECTOMY FUSION N/A 01/11/2023  Procedure: Anterior Cervical Decompression/Discectomy Fusion Cervical Five-Cervical Six - Cervical Six-Cervical Seven;  Surgeon: Agustina Aldrich, MD;  Location: [facility] OR;  Service: Neurosurgery;  Laterality: N/A;  3C  APPENDECTOMY    BREAST SURGERY    saline implants  CARDIAC CATHETERIZATION    CAROTID ENDARTERECTOMY  1992  CAROTID ENDARTERECTOMY Right 02/13/2005  Re-do Right CE  CATARACT EXTRACTION, BILATERAL  2013  CHOLECYSTECTOMY, LAPAROSCOPIC  04/02/2014  Dr. Hortensia Ma  CORONARY ARTERY BYPASS GRAFT  2006  x3 Dr. Matt Song  ENDARTERECTOMY Left 07/20/2014  Procedure: ENDARTERECTOMY CAROTID-LEFT;  Surgeon: Mayo Speck, MD;  Location: [facility] OR;  Service: Vascular;  Laterality: Left;  EYE SURGERY Bilateral 10/2011  Cataract Left eye  SEPTOPLASTY    with bilateral inferior turbinate reductions  SPINE SURGERY  12/2015  TOTAL ABDOMINAL HYSTERECTOMY    UPPER GASTROINTESTINAL ENDOSCOPY    had polyps removed from esophagus  UPPER GI ENDOSCOPY  Cassidi Sockwell M.A., CCC-SLP 02/15/2024, 10:48 AM  ECHOCARDIOGRAM COMPLETE Result Date: 02/09/2024    ECHOCARDIOGRAM REPORT   Patient Name:   TRANNIE BARDALES Date of Exam: 02/09/2024 Medical Rec #:  [id number]      Height:       62.0 in Accession #:    [id number]     Weight:       137.6 lb Date of Birth:  [birth year]      BSA:          1.631 m Patient Age:    82 years       BP:           146/60 mmHg Patient Gender: F              HR:           68 bpm. Exam Location:  Inpatient Procedure: 2D Echo, Cardiac Doppler and Color Doppler (Both Spectral and Color            Flow Doppler were utilized during procedure). Indications:    Stroke  History:        Patient has prior  history of Echocardiogram examinations, most                 recent 11/10/2023. Risk Factors:Hypertension.  Sonographer:    Jeralene Mom Referring Phys: [id number] TAYLOR A PARCELLS IMPRESSIONS  1. Left ventricular ejection fraction, by estimation, is 60 to 65%. The left ventricle has normal function. The left ventricle has no regional wall motion abnormalities. Left ventricular diastolic parameters are consistent with Grade III diastolic dysfunction (restrictive).  2. Right ventricular systolic function is mildly reduced. The right ventricular size is normal. There is mildly elevated pulmonary artery systolic pressure.  3. Left atrial size was moderately dilated.  4. The mitral valve is normal in structure. Trivial mitral valve regurgitation. No evidence of mitral stenosis.  5. The aortic valve is grossly normal. There is mild calcification of the aortic valve. There is mild thickening of the aortic valve. Aortic valve regurgitation is not visualized. Aortic valve sclerosis/calcification is present, without any evidence of aortic stenosis.  6. The inferior vena cava is normal in size with greater than 50% respiratory variability, suggesting right atrial pressure of 3 mmHg. Comparison(s): No significant change from prior study. Conclusion(s)/Recommendation(s): Otherwise normal echocardiogram, with minor abnormalities described in the report. No intracardiac source of embolism detected on this transthoracic study. Consider a transesophageal echocardiogram to exclude cardiac source of embolism if clinically indicated. FINDINGS  Left Ventricle: Left ventricular ejection fraction, by estimation, is 60 to 65%. The left ventricle has normal function. The left ventricle has no regional wall motion abnormalities. The left ventricular internal cavity size was normal in size. There is  no left ventricular hypertrophy. Left ventricular diastolic parameters are consistent with Grade III diastolic dysfunction (restrictive).  Right Ventricle: The right ventricular size is normal. No increase in right ventricular wall thickness. Right ventricular systolic function is mildly reduced. There is mildly elevated pulmonary artery systolic pressure. The tricuspid regurgitant velocity  is 3.01 m/s, and with an assumed right atrial pressure of 3 mmHg, the estimated right ventricular systolic pressure is 39.2 mmHg. Left Atrium: Left atrial size was moderately dilated. Right Atrium: Right atrial size was normal in size. Pericardium: There is no evidence of pericardial effusion. Mitral Valve: The mitral valve is normal in structure. Trivial mitral valve regurgitation. No evidence of mitral valve stenosis. Tricuspid Valve: The tricuspid valve is normal in structure. Tricuspid valve  regurgitation is trivial. No evidence of tricuspid stenosis. Aortic Valve: The aortic valve is grossly normal. There is mild calcification of the aortic valve. There is mild thickening of the aortic valve. Aortic valve regurgitation is not visualized. Aortic valve sclerosis/calcification is present, without any evidence of aortic stenosis. Aortic valve mean gradient measures 8.0 mmHg. Aortic valve peak gradient measures 13.9 mmHg. Aortic valve area, by VTI measures 1.17 cm. Pulmonic Valve: The pulmonic valve was not well visualized. Pulmonic valve regurgitation is not visualized. No evidence of pulmonic stenosis. Aorta: The aortic root, ascending aorta, aortic arch and descending aorta are all structurally normal, with no evidence of dilitation or obstruction. Venous: The inferior vena cava is normal in size with greater than 50% respiratory variability, suggesting right atrial pressure of 3 mmHg. IAS/Shunts: The atrial septum is grossly normal.  LEFT VENTRICLE PLAX 2D LVIDd:         3.70 cm   Diastology LVIDs:         2.50 cm   LV e' medial:    5.87 cm/s LV PW:         0.80 cm   LV E/e' medial:  25.2 LV IVS:        0.80 cm   LV e' lateral:   6.20 cm/s LVOT diam:     1.70  cm   LV E/e' lateral: 23.9 LV SV:         52 LV SV Index:   32 LVOT Area:     2.27 cm  RIGHT VENTRICLE RV Basal diam:  3.40 cm RV Mid diam:    3.00 cm RV S prime:     9.14 cm/s TAPSE (M-mode): 1.3 cm LEFT ATRIUM             Index        RIGHT ATRIUM           Index LA diam:        3.60 cm 2.21 cm/m   RA Area:     12.40 cm LA Vol (A2C):   72.2 ml 44.28 ml/m  RA Volume:   25.40 ml  15.58 ml/m LA Vol (A4C):   45.4 ml 27.84 ml/m LA Biplane Vol: 58.1 ml 35.63 ml/m  AORTIC VALVE AV Area (Vmax):    1.28 cm AV Area (Vmean):   1.25 cm AV Area (VTI):     1.17 cm AV Vmax:           186.67 cm/s AV Vmean:          130.667 cm/s AV VTI:            0.445 m AV Peak Grad:      13.9 mmHg AV Mean Grad:      8.0 mmHg LVOT Vmax:         105.00 cm/s LVOT Vmean:        71.700 cm/s LVOT VTI:          0.229 m LVOT/AV VTI ratio: 0.51  AORTA Ao Root diam: 2.30 cm Ao Asc diam:  2.50 cm MITRAL VALVE                TRICUSPID VALVE MV Area (PHT): 4.15 cm     TR Peak grad:   36.2 mmHg MV Decel Time: 183 msec     TR Vmax:        301.00 cm/s MR Peak grad: 91.4 mmHg MR Vmax:      478.00 cm/s   SHUNTS MV E  velocity: 148.00 cm/s  Systemic VTI:  0.23 m MV A velocity: 63.60 cm/s   Systemic Diam: 1.70 cm MV E/A ratio:  2.33 Sheryle Donning MD Electronically signed by Sheryle Donning MD Signature Date/Time: 02/09/2024/12:51:45 PM    Final    DG Swallowing Func-Speech Pathology Result Date: 02/09/2024 Table formatting from the original result was not included. Modified Barium Swallow Study Patient Details Name: RYLYNNE SCHICKER MRN: [id number] Date of Birth: 05/17/1941 Today's Date: 02/09/2024 HPI/PMH: HPI: Patient is an 83 year old female admitted to [facility] with acute onset right-sided weakness.  Patient found to have left MCA CVA evolving.  Swallow and speech eval ordered.  Patient resides with her spouse and was using a walker prior to admission due to back issues per staff.  PT has prior h/o dysphagia after ACDF - that  resolved per chart review.  She underwent BSE 02/08/2024 and requires MBS due to CVA, motor planning deficits negating her ability to cough on command and s/s of aspiration.  SLP follow up for dysphagia/dysphasia indicated. Clinical Impression: Clinical Impression: Patient presents with sensorimotor oropharyngeal dysphagia resutling in delayed oral transiting with impaired oral control resulting in premature spillage of boluses into pharynx and minimal mastication of solids.  He benefited from puree bolus to aid oral transiting of solids- Pt pharyngeal swallow trigger was at pyriform sinus with liquids and over epiglottis with vallecula with solids/puree.  Larger bolus size results in improved pharyngeal swallow inititaion with liquids but delay continues.  Discoordination of swallow noted with pt propelling bolus from vallecula into oral cavity and reswallowing - This has likely occured at bedside as well.  No aspiration noted across all boluses and flash penetration cleared within the same swallow.  Pharyngeal swallow is strong fortunately.  Cueing pt to swallow appeared helpful to help elicit swallow but likely was just sensorimotor response.   Recommend consider puree/nectar diet and allow thin liquids via Provale cup - providing items that provide better sensory input.  Recommend thin water between meals after mouth care to faciliate hydration.  Will follow up for dysphagia management.  Please oral suction pt after meals and place boluses on the left side of pt's oral cavity. Factors that may increase risk of adverse event in presence of aspiration Roderick Civatte & Jessy Morocco 2021): Factors that may increase risk of adverse event in presence of aspiration Roderick Civatte & Jessy Morocco 2021): Limited mobility; Dependence for feeding and/or oral hygiene; Weak cough; Reduced cognitive function; Frail or deconditioned Recommendations/Plan: Swallowing Evaluation Recommendations Swallowing Evaluation Recommendations Recommendations: PO  diet PO Diet Recommendation: Dysphagia 1 (Pureed); Thin liquids (Level 0) Liquid Administration via: Cup; Straw; Other (Comment) Medication Administration: Crushed with puree Supervision: Full assist for feeding; Staff to assist with self-feeding; Full supervision/cueing for swallowing strategies Swallowing strategies  : Slow rate; Small bites/sips; Check for pocketing or oral holding; Check for anterior loss Postural changes: Position pt fully upright for meals; Stay upright 30-60 min after meals Oral care recommendations: Oral care before ice chips/water Recommended consults: Other(comment); Consider dietitian consultation Caregiver Recommendations: Have oral suction available Treatment Plan Treatment Plan Treatment recommendations: Therapy as outlined in treatment plan below Follow-up recommendations: Acute inpatient rehab (3 hours/day) Functional status assessment: Patient has had a recent decline in their functional status and demonstrates the ability to make significant improvements in function in a reasonable and predictable amount of time. Treatment frequency: Min 2x/week Treatment duration: 1 week Interventions: Compensatory techniques; Aspiration precaution training; Trials of upgraded texture/liquids; Patient/family education; Diet toleration management by SLP  Recommendations Recommendations for follow up therapy are one component of a multi-disciplinary discharge planning process, led by the attending physician.  Recommendations may be updated based on patient status, additional functional criteria and insurance authorization. Assessment: Orofacial Exam: Orofacial Exam Oral Cavity: Oral Hygiene: WFL Oral Cavity - Dentition: Adequate natural dentition Oral Motor/Sensory Function: Suspected cranial nerve impairment CN V - Trigeminal: Right motor impairment; Right sensory impairment CN VII - Facial: Right motor impairment CN IX - Glossopharyngeal, CN X - Vagus: Right motor impairment CN XII - Hypoglossal:  Right motor impairment Anatomy: Anatomy: Presence of cervical hardware (appearance of carotid endarterectomy clips) Boluses Administered: Boluses Administered Boluses Administered: Thin liquids (Level 0); Mildly thick liquids (Level 2, nectar thick); Puree; Solid  Oral Impairment Domain: Oral Impairment Domain Lip Closure: Escape progressing to mid-chin Tongue control during bolus hold: Posterior escape of greater than half of bolus; Posterior escape of less than half of bolus Bolus preparation/mastication: Minimal chewing/mashing with majority of bolus unchewed Bolus transport/lingual motion: Slow tongue motion; Repetitive/disorganized tongue motion Oral residue: Trace residue lining oral structures; Residue collection on oral structures Location of oral residue : Tongue Initiation of pharyngeal swallow : Pyriform sinuses; Posterior laryngeal surface of the epiglottis  Pharyngeal Impairment Domain: Pharyngeal Impairment Domain Soft palate elevation: No bolus between soft palate (SP)/pharyngeal wall (PW) Laryngeal elevation: Partial superior movement of thyroid  cartilage/partial approximation of arytenoids to epiglottic petiole Anterior hyoid excursion: Complete anterior movement Epiglottic movement: Complete inversion; Partial inversion Laryngeal vestibule closure: Incomplete, narrow column air/contrast in laryngeal vestibule Pharyngeal stripping wave : Present - diminished Pharyngeal contraction (A/P view only): N/A Pharyngoesophageal segment opening: Complete distension and complete duration, no obstruction of flow Tongue base retraction: Trace column of contrast or air between tongue base and PPW (with cracker bolus) Pharyngeal residue: Trace residue within or on pharyngeal structures Location of pharyngeal residue: Valleculae  Esophageal Impairment Domain: Esophageal Impairment Domain Esophageal clearance upright position: Complete clearance, esophageal coating Pill: Pill Consistency administered: -- (DNT due  to aspiration risk) Penetration/Aspiration Scale Score: Penetration/Aspiration Scale Score 1.  Material does not enter airway: Puree; Solid 2.  Material enters airway, remains ABOVE vocal cords then ejected out: Thin liquids (Level 0); Mildly thick liquids (Level 2, nectar thick) Compensatory Strategies: Compensatory Strategies Compensatory strategies: Yes Straw: Effective Effective Straw: Thin liquid (Level 0); Mildly thick liquid (Level 2, nectar thick) Other(comment): -- (extra tsp bolus, verbal cueing)   General Information: Caregiver present: No (spouse stayed in the pt's room)  Diet Prior to this Study: NPO   Temperature : Normal   Respiratory Status: WFL   Supplemental O2: None (Room air)   History of Recent Intubation: No  Behavior/Cognition: Alert; Doesn't follow directions Self-Feeding Abilities: Needs set-up for self-feeding Baseline vocal quality/speech: Hypophonia/low volume Volitional Cough: Unable to elicit Volitional Swallow: Unable to elicit No data recorded Goal Planning: Prognosis for improved oropharyngeal function: Fair Barriers to Reach Goals: Severity of deficits; Other (Comment); Language deficits No data recorded Patient/Family Stated Goal: for pt to get better Consulted and agree with results and recommendations: Pt unable/family or caregiver not available Pain: Pain Assessment Pain Assessment: Faces Pain Score: 0 Faces Pain Scale: 0 Breathing: 0 Negative Vocalization: 0 Facial Expression: 0 Body Language: 0 Consolability: 0 PAINAD Score: 0 End of Session: Start Time:SLP Start Time (ACUTE ONLY): 1005 Stop Time: SLP Stop Time (ACUTE ONLY): 1030 Time Calculation:SLP Time Calculation (min) (ACUTE ONLY): 25 min Charges: SLP Evaluations $ SLP Speech Visit: 1 Visit SLP Evaluations $BSS Swallow: 1 Procedure $MBS  Swallow: 1 Procedure $ SLP EVAL LANGUAGE/SOUND PRODUCTION: 1 Procedure $Swallowing Treatment: 1 Procedure SLP visit diagnosis: SLP Visit Diagnosis: Dysphagia, oropharyngeal phase (R13.12)  Past Medical History: Past Medical History: Diagnosis Date  Acute hypoxic respiratory failure (HCC) 11/09/2023  Acute non-recurrent maxillary sinusitis 10/28/2022  Allergy   hay fever  Anemia   Anxiety   Back pain   Carotid artery occlusion   Cerebrovascular disease   extracranial; occlusive  Chicken pox   Coronary artery disease   Depression   Dizziness   Dizziness   DVT (deep venous thrombosis) (HCC)   Fainting spell   GERD (gastroesophageal reflux disease)   Headache   Heart murmur   Hyperlipidemia   Hypertension   Leg pain   Mitral regurgitation   PONV (postoperative nausea and vomiting)   severe nausea and vomiting  Pre-syncope   PVD (peripheral vascular disease) (HCC)   endarterectomy by Dr. Shirley Douglas  Seasonal allergies   Shortness of breath dyspnea   wth ambulation at times  Swelling of both ankles   and abdomen; takes Lasix  when needed  Thrombophlebitis   following childbirth  Ulcer  Past Surgical History: Past Surgical History: Procedure Laterality Date  ANTERIOR CERVICAL DECOMP/DISCECTOMY FUSION N/A 01/11/2023  Procedure: Anterior Cervical Decompression/Discectomy Fusion Cervical Five-Cervical Six - Cervical Six-Cervical Seven;  Surgeon: Agustina Aldrich, MD;  Location: [facility] OR;  Service: Neurosurgery;  Laterality: N/A;  3C  APPENDECTOMY    BREAST SURGERY    saline implants  CARDIAC CATHETERIZATION    CAROTID ENDARTERECTOMY  1992  CAROTID ENDARTERECTOMY Right 02/13/2005  Re-do Right CE  CATARACT EXTRACTION, BILATERAL  2013  CHOLECYSTECTOMY, LAPAROSCOPIC  04/02/2014  Dr. Hortensia Ma  CORONARY ARTERY BYPASS GRAFT  2006  x3 Dr. Matt Song  ENDARTERECTOMY Left 07/20/2014  Procedure: ENDARTERECTOMY CAROTID-LEFT;  Surgeon: Mayo Speck, MD;  Location: [facility] OR;  Service: Vascular;  Laterality: Left;  EYE SURGERY Bilateral 10/2011  Cataract Left eye  SEPTOPLASTY    with bilateral inferior turbinate reductions  SPINE SURGERY  12/2015  TOTAL ABDOMINAL HYSTERECTOMY    UPPER GASTROINTESTINAL ENDOSCOPY    had polyps removed from  esophagus  UPPER GI ENDOSCOPY   Maudie Sorrow, MS [facility]: Lambert Rhonda W SLP Acute Rehab Services Office [phone] [place]al Comment 02/09/2024, 12:14 PM  VAS US  LOWER EXTREMITY VENOUS (DVT) Result Date: 02/08/2024  Lower Venous DVT Study Patient Name:  SALISA BROZ  Date of Exam:   02/08/2024 Medical Rec #: [id number]       Accession #:    [id number] Date of Birth: [birth year]       Patient Gender: F Patient Age:   79 years Exam Location:  [facility] Procedure:      VAS US  LOWER EXTREMITY VENOUS (DVT) Referring Phys: Fraser Jackson XU --------------------------------------------------------------------------------  Indications: Stroke.  Risk Factors: None identified. Limitations: Poor ultrasound/tissue interface and patient positioning, patient immobility. Comparison Study: No prior studies. Performing Technologist: Lerry Ransom RVT  Examination Guidelines: A complete evaluation includes B-mode imaging, spectral Doppler, color Doppler, and power Doppler as needed of all accessible portions of each vessel. Bilateral testing is considered an integral part of a complete examination. Limited examinations for reoccurring indications may be performed as noted. The reflux portion of the exam is performed with the patient in reverse Trendelenburg.  +---------+---------------+---------+-----------+----------+--------------+ RIGHT    CompressibilityPhasicitySpontaneityPropertiesThrombus Aging +---------+---------------+---------+-----------+----------+--------------+ CFV      Full           Yes      Yes                                 +---------+---------------+---------+-----------+----------+--------------+  SFJ      Full                                                        +---------+---------------+---------+-----------+----------+--------------+ FV Prox  Full                                                        +---------+---------------+---------+-----------+----------+--------------+ FV Mid   Full                                                         +---------+---------------+---------+-----------+----------+--------------+ FV Distal               Yes      Yes                                 +---------+---------------+---------+-----------+----------+--------------+ PFV      Full                                                        +---------+---------------+---------+-----------+----------+--------------+ POP      Full           Yes      Yes                                 +---------+---------------+---------+-----------+----------+--------------+ PTV      Full                                                        +---------+---------------+---------+-----------+----------+--------------+ PERO     Full                                                        +---------+---------------+---------+-----------+----------+--------------+   +---------+---------------+---------+-----------+----------+--------------+ LEFT     CompressibilityPhasicitySpontaneityPropertiesThrombus Aging +---------+---------------+---------+-----------+----------+--------------+ CFV      Full           Yes      Yes                                 +---------+---------------+---------+-----------+----------+--------------+ SFJ      Full                                                        +---------+---------------+---------+-----------+----------+--------------+  FV Prox  Full                                                        +---------+---------------+---------+-----------+----------+--------------+ FV Mid   Full                                                        +---------+---------------+---------+-----------+----------+--------------+ FV DistalFull                                                        +---------+---------------+---------+-----------+----------+--------------+ PFV      Full                                                         +---------+---------------+---------+-----------+----------+--------------+ POP      Full           Yes      Yes                                 +---------+---------------+---------+-----------+----------+--------------+ PTV      Full                                                        +---------+---------------+---------+-----------+----------+--------------+ PERO     Full                                                        +---------+---------------+---------+-----------+----------+--------------+     Summary: RIGHT: - There is no evidence of deep vein thrombosis in the lower extremity. However, portions of this examination were limited- see technologist comments above.  - No cystic structure found in the popliteal fossa.  LEFT: - There is no evidence of deep vein thrombosis in the lower extremity. However, portions of this examination were limited- see technologist comments above.  - No cystic structure found in the popliteal fossa.  *See table(s) above for measurements and observations. Electronically signed by Angela Kell MD on 02/08/2024 at 2:59:07 PM.    Final    CT HEAD POST STROKE FOLLOWUP/TIMED/STAT READ Result Date: 02/08/2024 CLINICAL DATA:  Follow-up examination for neuro deficit, stroke. EXAM: CT HEAD WITHOUT CONTRAST TECHNIQUE: Contiguous axial images were obtained from the base of the skull through the vertex without intravenous contrast. RADIATION DOSE REDUCTION: This exam was performed according to the departmental dose-optimization program which includes automated exposure control, adjustment of the mA and/or kV according to patient size and/or use of iterative  reconstruction technique. COMPARISON:  Comparison made with MRI from 02/07/2024 FINDINGS: Brain: Evolving cytotoxic edema involving the left frontal lobe, consistent with an evolving acute left MCA distribution infarct, stable from prior MRI. No evidence for hemorrhagic transformation. Localized edema  with partial effacement of the left lateral ventricle, but no significant midline shift, relatively similar to prior. No other acute intracranial hemorrhage. No other acute large vessel territory infarct. No mass lesion or hydrocephalus. No extra-axial fluid collection. Vascular: Asymmetric hyperdense left MCA, consistent with previously identified occlusion. Skull: Scalp soft tissues and calvarium demonstrate no new finding. Sinuses/Orbits: Globes orbital soft tissues demonstrate no acute finding. Paranasal sinuses and mastoid air cells remain largely clear. Other: None. IMPRESSION: 1. Evolving acute left MCA distribution infarct, not significantly changed from recent MRI. No evidence for hemorrhagic transformation. Localized edema with partial effacement of the left lateral ventricle, but no significant midline shift. 2. No other new acute intracranial abnormality. Electronically Signed   By: Virgia Griffins M.D.   On: 02/08/2024 01:32   MR BRAIN WO CONTRAST Result Date: 02/07/2024 CLINICAL DATA:  Follow-up examination for stroke. EXAM: MRI HEAD WITHOUT CONTRAST TECHNIQUE: Multiplanar, multiecho pulse sequences of the brain and surrounding structures were obtained without intravenous contrast. COMPARISON:  CTs from earlier the same day. FINDINGS: Brain: Cerebral volume within normal limits. Patchy T2/FLAIR hyperintensity involving the periventricular and deep white matter both cerebral hemispheres the consistent with chronic small vessel ischemic disease, mild to moderate in nature. Confluent restricted diffusion involving the left frontal lobe, consistent with a moderate size evolving left MCA territory infarct. Involvement of the left insula and left basal ganglia noted. No associated hemorrhage or significant regional mass effect. No other evidence for acute or subacute ischemia. No acute intracranial hemorrhage. Few punctate chronic micro hemorrhages noted involving the right cerebellum and right  cerebral hemisphere. No mass lesion, midline shift or mass effect. No hydrocephalus or extra-axial fluid collection. Pituitary gland within normal limits. Vascular: Loss of normal flow void within the left ICA and MCA, consistent with occlusion as seen on prior CTA. Major intracranial vascular flow voids are otherwise maintained. Skull and upper cervical spine: Craniocervical junction within normal limits. Bone marrow signal intensity normal. No scalp soft tissue abnormality. Sinuses/Orbits: Prior bilateral ocular lens replacement. Paranasal sinuses are largely clear. No mastoid effusion. Other: None. IMPRESSION: 1. Moderate sized acute left MCA territory infarct. No associated hemorrhage or significant regional mass effect. 2. Loss of normal flow void within the left ICA and MCA, consistent with occlusion as seen on prior CTA. 3. Underlying mild to moderate chronic microvascular ischemic disease. Electronically Signed   By: Virgia Griffins M.D.   On: 02/07/2024 20:30   CT ANGIO HEAD NECK W WO CM W PERF (CODE STROKE) Result Date: 02/07/2024 CLINICAL DATA:  Neuro deficit, acute, stroke suspected. EXAM: CT ANGIOGRAPHY HEAD AND NECK CT PERFUSION BRAIN TECHNIQUE: Multidetector CT imaging of the head and neck was performed using the standard protocol during bolus administration of intravenous contrast. Multiplanar CT image reconstructions and MIPs were obtained to evaluate the vascular anatomy. Carotid stenosis measurements (when applicable) are obtained utilizing NASCET criteria, using the distal internal carotid diameter as the denominator. Multiphase CT imaging of the brain was performed following IV bolus contrast injection. Subsequent parametric perfusion maps were calculated using RAPID software. RADIATION DOSE REDUCTION: This exam was performed according to the departmental dose-optimization program which includes automated exposure control, adjustment of the mA and/or kV according to patient size and/or  use of iterative reconstruction  technique. CONTRAST:  OMNIPAQUE  IOHEXOL  350 MG/ML SOLN COMPARISON:  CTA neck 04/22/2020 FINDINGS: CTA NECK FINDINGS Aortic arch: Standard branching with prominent calcified plaque involving the great vessel origins including a proximally 75% stenosis of the origin of the brachiocephalic artery, similar to the prior CTA. 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 Similar appearance of calcified and ulcerated soft plaque in this region. Widely patent ICA. Left carotid system: Patent with 50% stenosis of the common carotid artery origin and less than 50% stenosis of the proximal ICA, similar to the prior CTA. Vertebral arteries: Patent without evidence of a significant stenosis or dissection within limitation of streak artifact which obscures a portion of the right V1 segment. Element left vertebral artery. Skeleton: C5-C7 ACDF. Advanced upper cervical facet arthrosis. C3-4 facet ankylosis. Other neck: No evidence of cervical lymphadenopathy or mass. Upper chest: Partially visualized small to moderate bilateral pleural effusions with associated atelectasis. Status post CABG. Review of the MIP images confirms the above findings CTA HEAD FINDINGS Anterior circulation: The intracranial left ICA is patent proximally, however there is progressively diminished opacification more distally with the vessel appearing occluded beginning in the cavernous segment through the terminus. The left M1 and proximal left A1 segments are also occluded. There is reconstitution of the left ACA via the anterior communicating artery. There is mild-to-moderate reconstitution of the distal left M1 segment and left MCA branch vessels. The intracranial right ICA is patent with mild nonstenotic atherosclerosis. The right ACA and right MCA are patent without evidence  of a significant proximal stenosis. No aneurysm is identified. Posterior circulation: The intracranial vertebral arteries are patent to the basilar with the right being hypoplastic distal to the PICA origin. Patent PICA and SCA origins are visualized bilaterally. The basilar artery is patent and mildly irregular without a significant stenosis. Posterior communicating arteries are diminutive or absent. Both PCAs are patent without evidence of a significant proximal stenosis. No aneurysm is identified. Venous sinuses: As permitted by contrast timing, patent. Anatomic variants: None. Review of the MIP images confirms the above findings CT Brain Perfusion Findings: CT perfusion imaging is felt to be of limited diagnostic utility due to bolus timing, with the arterial upstroke being incompletely captured and without a good venous outflow curve. The perfusion software does not detect the large left MCA infarct which is apparent on the earlier noncontrast head CT and reports 22 mL of mismatch volume. These results were communicated to Dr. Lindzen at 3:21 pm on 02/07/2024 by text page via the [facility] messaging system. IMPRESSION: 1. 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 2. Limited CT perfusion imaging as described above. 3. 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 4. Unchanged 50% stenosis of the origin of the left common carotid artery and 75% stenosis of the brachiocephalic artery. 5. Partially visualized small to moderate bilateral pleural effusions. 6.  Aortic Atherosclerosis (ICD10-I70.0). Electronically Signed   By: Aundra Lee M.D.   On: 02/07/2024 15:40   CT HEAD CODE STROKE WO CONTRAST Result Date: 02/07/2024 CLINICAL DATA:  Code stroke. Neuro deficit, acute, stroke suspected. Fall this morning. Altered mental status. Aphasia, right-sided deficits, and left-sided facial droop. EXAM: CT HEAD WITHOUT CONTRAST  TECHNIQUE: Contiguous axial images were obtained from the base of the skull through the vertex without intravenous contrast. RADIATION DOSE  REDUCTION: This exam was performed according to the departmental dose-optimization program which includes automated exposure control, adjustment of the mA and/or kV according to patient size and/or use of iterative reconstruction technique. COMPARISON:  Head MRI 08/03/2018 FINDINGS: Brain: There is evidence of an acute left MCA territory infarct involving portions of the frontal and parietal lobes (most notably at the level of the operculum), insula, caudate, lentiform nucleus, and likely internal capsule. No acute intracranial hemorrhage, significant midline shift, hydrocephalus, or extra-axial fluid collection is identified. Cerebral white matter hypodensities elsewhere are nonspecific but compatible with mild chronic small vessel ischemic disease. Cerebral volume is within normal limits for age. Vascular: Calcified atherosclerosis at the skull base. Hyperdense left M1 segment. Skull: No fracture or suspicious lesion. Sinuses/Orbits: Visualized paranasal sinuses and mastoid air cells are clear. Bilateral cataract extraction. Other: None. ASPECTS [facility] Stroke Program Early CT Score) - Ganglionic level infarction (caudate, lentiform nuclei, internal capsule, insula, M1-M3 cortex): 1 - Supraganglionic infarction (M4-M6 cortex): 2 Total score (0-10 with 10 being normal): 3 These results were communicated to Dr. Lindzen at 2:33 pm on 02/07/2024 by text page via the [facility] messaging system. IMPRESSION: Acute left MCA territory infarct. ASPECTS of 3. Electronically Signed   By: Aundra Lee M.D.   On: 02/07/2024 14:33   MR LUMBAR SPINE WO CONTRAST Result Date: 02/03/2024 CLINICAL DATA:  Chronic lower back pain radiating into the bilateral legs. Evaluate for spinal stenosis. EXAM: MRI LUMBAR SPINE WITHOUT CONTRAST TECHNIQUE: Multiplanar, multisequence MR imaging of the lumbar spine  was performed. No intravenous contrast was administered. COMPARISON:  Lumbar spine radiographs, 05/13/2023; MRI lumbar spine 11/12/2020; chest two views 01/22/2020 FINDINGS: Segmentation: Counting down from T1 on prior chest radiographs, there are tiny ribs at the vertebral body considered T12. Distal to this, the next 5 vertebral bodies are considered L1 through L5. On the current MRI axial series 113, image 41 corresponds to the L5-S1 disc space. This is in agreement with a 11/12/2020 lumbar spine MRI report. There is partial sacralization of L5. Alignment:  There is 2 mm retrolisthesis of L4 on L5, unchanged. Vertebrae: Vertebral body heights are maintained. Disc spaces are preserved. Decreased disc hydration is greatest at T10-11 and L2-3 through L4-5. Tiny 9 fat intensity anterior superior T12 vertebral body hemangioma is unchanged (sagittal series 206, image 8). Tiny superior L3 endplate degenerative Schmorl's node is unchanged. Conus medullaris and cauda equina: Conus extends to the mid L1 level. Conus and cauda equina appear normal. Paraspinal and other soft tissues: Partially visualized multiple decreased T1 increased T2 signal right renal cysts are again seen, not significantly changed from prior. No follow-up imaging is recommended. Disc levels: L1-2: No posterior disc bulge, central canal narrowing, or neuroforaminal stenosis. L2-3: Mild bilateral facet joint hypertrophy. Mild broad-based posterior disc bulge. Mild flattening of the ventral thecal sac, unchanged. No central canal stenosis. Mild bilateral intraforaminal disc extension. Mild left neuroforaminal stenosis is unchanged to minimally worsened from prior. L3-4: Mild-to-moderate bilateral facet joint and ligamentum flavum hypertrophy. Mild-to-moderate broad-based posterior disc osteophyte complex with bilateral intraforaminal extension. New superimposed left lateral recess posterior disc protrusion measuring up to 4 mm in AP dimension (sagittal  series 105, image 10 and axial series 113, image 29) newly minimally posteriorly displaces the descending left L4 nerve within the left lateral recess. New moderate left lateral recess stenosis. Mildly worsened mild-to-moderate left neuroforaminal stenosis. Very mild right neuroforaminal narrowing is similar to prior. No central canal stenosis. L4-5: Postsurgical changes are again seen of right  hemilaminotomy. Mild-to-moderate bilateral facet joint hypertrophy. Moderate broad-based posterior disc bulge measure up to 6 mm in AP dimension, similar to prior. Disc minimally impresses on the descending right L5 nerve within the right lateral recess. Mild-to-moderate right and mild left lateral recess stenosis. Overall borderline mild central canal stenosis. Very mild bilateral neuroforaminal narrowing without true stenosis. No significant change. L5-S1: Rudimentary disc. No posterior disc bulge, central canal narrowing, or neuroforaminal stenosis. Left L5-S1 transitional assimilation joint (sagittal image 15). IMPRESSION: Compared to 11/12/2020: 1. Transitional lumbosacral anatomy. Recommend correlation with the numbering nomenclature in this report prior to any image guided invasive procedure. 2. At L3-4, there is a new left lateral recess posterior disc protrusion which minimally posteriorly displaces the descending left L4 nerve within the left lateral recess. New moderate left lateral recess stenosis. Mildly worsened mild-to-moderate left neuroforaminal stenosis. 3. At L4-5, there are postsurgical changes of right hemilaminotomy. Moderate broad-based posterior disc bulge minimally impresses on the descending right L5 nerve within the right lateral recess. Mild-to-moderate right and mild left lateral recess stenosis. 4. At L2-3, there is mild left neuroforaminal stenosis, unchanged to minimally worsened from prior. Electronically Signed   By: Bertina Broccoli M.D.   On: 02/03/2024 19:12     Subjective: No acute  issues or events overnight   Discharge Exam: Vitals:   02/18/24 0605 02/18/24 0730  BP: (!) 141/48 (!) 136/54  Pulse:  61  Resp:  12  Temp:  98.5 F (36.9 C)  SpO2:  94%   Vitals:   02/17/24 2326 02/18/24 0500 02/18/24 0605 02/18/24 0730  BP: 132/61  (!) 141/48 (!) 136/54  Pulse: 69   61  Resp: 17   12  Temp: 98.3 F (36.8 C)   98.5 F (36.9 C)  TempSrc: Oral   Oral  SpO2: 93%   94%  Weight:  62.4 kg    Height:        General: Pt is alert, awake, not in acute distress Cardiovascular: RRR, S1/S2 +, no rubs, no gallops Respiratory: CTA bilaterally, no wheezing, no rhonchi Abdominal: Soft, NT, ND, bowel sounds + Extremities: Right-sided weakness in both upper and lower extremities    The results of significant diagnostics from this hospitalization (including imaging, microbiology, ancillary and laboratory) are listed below for reference.     Microbiology: No results found for this or any previous visit (from the past 240 hours).   Labs: BNP (last 3 results) Recent Labs    11/09/23 1707  BNP 204.2*   Basic Metabolic Panel: Recent Labs  Lab 02/15/24 0826 02/16/24 0549  NA 142 140  K 5.3* 4.8  CL 109 106  CO2 21* 27  GLUCOSE 87 97  BUN 21 20  CREATININE 0.78 0.75  CALCIUM  8.7* 8.7*   Liver Function Tests: No results for input(s): AST, ALT, ALKPHOS, BILITOT, PROT, ALBUMIN in the last 168 hours. No results for input(s): LIPASE, AMYLASE in the last 168 hours. No results for input(s): AMMONIA in the last 168 hours. CBC: Recent Labs  Lab 02/15/24 0955  WBC 9.5  HGB 11.8*  HCT 38.1  MCV 87.2  PLT 288   Urinalysis    Component Value Date/Time   COLORURINE YELLOW 07/13/2014 1025   APPEARANCEUR CLEAR 07/13/2014 1025   LABSPEC 1.020 07/13/2014 1025   PHURINE 7.0 07/13/2014 1025   GLUCOSEU NEGATIVE 07/13/2014 1025   HGBUR NEGATIVE 07/13/2014 1025   BILIRUBINUR Neg 11/27/2019 1027   KETONESUR NEGATIVE 07/13/2014 1025   PROTEINUR  Negative 11/27/2019  1027   PROTEINUR NEGATIVE 07/13/2014 1025   UROBILINOGEN 0.2 11/27/2019 1027   UROBILINOGEN 1.0 07/13/2014 1025   NITRITE Neg 11/27/2019 1027   NITRITE NEGATIVE 07/13/2014 1025   LEUKOCYTESUR Negative 11/27/2019 1027   Sepsis Labs Recent Labs  Lab 02/15/24 0955  WBC 9.5   Microbiology No results found for this or any previous visit (from the past 240 hours).   Time coordinating discharge: Over 30 minutes  SIGNED:   Haydee Lipa, DO [facility] 02/18/2024, 7:32 AM Pager   If 7PM-7AM, please contact night-coverage [email or web address]

## 2024-02-18 NOTE — Progress Notes (Signed)
 Attempt to call report to [employer] at this time.  Phone was not answered.

## 2024-02-18 NOTE — Progress Notes (Signed)
 Physical Therapy Treatment Patient Details Name: Catherine Green MRN: [id number] DOB: 04/30/1941 Today's Date: 02/18/2024   History of Present Illness Patient is a 83 year old female admitted to [facility] on 02/07/24 with right side weakness, expressive aphasia, found to have large MCA stroke. PEG placement 6/20. History of HTN, GERD, CAD s/p CABG, A-fib, anemia, CHF, anxiety, depression, neuropathy, COPD, spinal stenosis s/p back surgery.    PT Comments  Pt making steady progress towards her physical therapy goals and seems motivated to participate. Session focused on closed chain exercises for both LUE/LLE to promote neuro recovery and plasticity and transfer/gait training. Pt ambulating 10 ft x 3 trials with L hall railing and chair follow. Provided light manual assist for forward progression of right lower extremity and for quad stabilization. Pt has excellent rehab potential.   If plan is discharge home, recommend the following: A lot of help with walking and/or transfers;A lot of help with bathing/dressing/bathroom;Assistance with cooking/housework;Assist for transportation;Supervision due to cognitive status;Help with stairs or ramp for entrance   Can travel by private vehicle     No  Equipment Recommendations  Wheelchair (measurements PT);Hospital bed;BSC/3in1;Wheelchair cushion (measurements PT)    Recommendations for Other Services       Precautions / Restrictions Precautions Precautions: Fall Recall of Precautions/Restrictions: Impaired Required Braces or Orthoses: Splint/Cast Splint/Cast: R resting hand splint Splint/Cast - Date Prophylactic Dressing Applied (if applicable): 02/10/24 Restrictions Weight Bearing Restrictions Per Provider Order: No     Mobility  Bed Mobility Overal bed mobility: Needs Assistance Bed Mobility: Rolling, Sidelying to Sit Rolling: Min assist Sidelying to sit: Min assist       General bed mobility comments: Rolling towards right with cues to  reach for bed rail, assist for RLE, light truncal elevation to upright    Transfers Overall transfer level: Needs assistance Equipment used: None Transfers: Sit to/from Stand Sit to Stand: Min assist, Mod assist           General transfer comment: Min-modA to rise from edge of bed and chair, manual assist for placement of RLE so both feet shoulder width apart, tactile facilitation at R quad    Ambulation/Gait Ambulation/Gait assistance: Mod assist, +2 safety/equipment Gait Distance (Feet): 10 Feet (10 ft x 3) Assistive device:  (L railing) Gait Pattern/deviations: Step-to pattern, Decreased weight shift to right, Decreased step length - right, Decreased stance time - right, Narrow base of support, Trunk flexed Gait velocity: decreased Gait velocity interpretation: <1.31 ft/sec, indicative of household ambulator   General Gait Details: Pt ambulating 10 ft x 3 with L railing and chair follow. Light manual assist provided for RLE progression forwards and for quad activation in stance phase. Verbal and tactile facilitation for upright posture. Intermittent scissoring and narrow BOS   Stairs             Wheelchair Mobility     Tilt Bed    Modified Rankin (Stroke Patients Only) Modified Rankin (Stroke Patients Only) Pre-Morbid Rankin Score: No symptoms Modified Rankin: Moderately severe disability     Balance Overall balance assessment: Needs assistance Sitting-balance support: Feet supported, Bilateral upper extremity supported Sitting balance-Leahy Scale: Fair     Standing balance support: Single extremity supported, During functional activity Standing balance-Leahy Scale: Poor                              Communication Communication Communication: Impaired Factors Affecting Communication: Hearing impaired;Difficulty expressing self  Cognition  Arousal: Alert Behavior During Therapy: WFL for tasks assessed/performed   PT - Cognitive impairments:  Difficult to assess Difficult to assess due to: Impaired communication                     PT - Cognition Comments: Hand over hand guidance for command following Following commands: Impaired Following commands impaired: Follows one step commands inconsistently    Cueing Cueing Techniques: Verbal cues, Gestural cues, Tactile cues, Visual cues  Exercises Other Exercises Other Exercises: Body on Arm, LUE crossing midline to reach with elbow stabilized    General Comments        Pertinent Vitals/Pain Pain Assessment Pain Assessment: Faces Faces Pain Scale: Hurts a little bit Pain Location: abdomen (PEG tube placement yesterday) Pain Descriptors / Indicators: Grimacing, Tender Pain Intervention(s): Monitored during session    Home Living                          Prior Function            PT Goals (current goals can now be found in the care plan section) Acute Rehab PT Goals Patient Stated Goal: spouse goal is to regain independence to go home with assistance, get back to shopping at [facility] Potential to Achieve Goals: Fair Progress towards PT goals: Progressing toward goals    Frequency    Min 3X/week      PT Plan      Co-evaluation              AM-PAC PT 6 Clicks Mobility   Outcome Measure  Help needed turning from your back to your side while in a flat bed without using bedrails?: A Little Help needed moving from lying on your back to sitting on the side of a flat bed without using bedrails?: A Little Help needed moving to and from a bed to a chair (including a wheelchair)?: A Little Help needed standing up from a chair using your arms (e.g., wheelchair or bedside chair)?: A Lot Help needed to walk in hospital room?: A Lot Help needed climbing 3-5 steps with a railing? : Total 6 Click Score: 14    End of Session Equipment Utilized During Treatment: Gait belt Activity Tolerance: Patient tolerated treatment well Patient left: in  chair;with call bell/phone within reach;with chair alarm set Nurse Communication: Mobility status PT Visit Diagnosis: Muscle weakness (generalized) (M62.81);Difficulty in walking, not elsewhere classified (R26.2);Pain;Hemiplegia and hemiparesis Hemiplegia - Right/Left: Right Hemiplegia - dominant/non-dominant: Dominant Hemiplegia - caused by: Cerebral infarction     Time: 1030-1101 PT Time Calculation (min) (ACUTE ONLY): 31 min  Charges:    $Gait Training: 8-22 mins $Therapeutic Activity: 8-22 mins PT General Charges $$ ACUTE PT VISIT: 1 Visit                     Verdia Glad, PT, DPT Acute Rehabilitation Services Office [phone]    Claria Crofts 02/18/2024, 11:17 AM

## 2024-02-18 NOTE — Progress Notes (Signed)
 Attempt x3 to call report to [employer] with phone going unanswered.  No further attempts to call report will be made.

## 2024-02-18 NOTE — Progress Notes (Signed)
 Referring Provider(s): Elgergawy   Supervising Physician: Marland Silvas  Patient Status:  [facility] - In-pt  Chief Complaint:  Dysphagia s/p image guided gastrostomy tube placement 02/17/24 Dr. Nereida Banning   Subjective:  Patient is sitting up in her bedside chair after having just worked with PT. She will be starting tube feedings shortly per IR direction. Her husband who is at bedside states that once she demonstrated being able to tolerate tube feedings, she will be getting discharged to rehab.  She is alert and denies any complaint.  Allergies: Lyrica  [pregabalin ], Hydrocodone , Ivp dye [iodinated contrast media], Lipitor [atorvastatin], Methylprednisolone , Oxycodone  hcl, Penicillin g, Shellfish allergy, Buspar  [buspirone ], Clavulanic acid, Prednisone , Codeine, Cymbalta  [duloxetine  hcl], Demerol, Doxycycline , Effexor  [venlafaxine ], Hydrochlorothiazide , Hydroxyzine , Metoprolol , Protonix  [pantoprazole  sodium], and Trazodone  and nefazodone  Medications: Prior to Admission medications   Medication Sig Start Date End Date Taking? Authorizing Provider  albuterol  (VENTOLIN  HFA) 108 (90 Base) MCG/ACT inhaler Inhale 2 puffs into the lungs every 6 (six) hours as needed for wheezing or shortness of breath. 10/26/22  Yes Donnie Galea, MD  amLODipine  (NORVASC ) 2.5 MG tablet Take 1 tablet (2.5 mg total) by mouth in the morning. 12/20/23  Yes Gollan, Timothy J, MD  APIXABAN  (ELIQUIS ) VTE STARTER PACK (10MG  AND 5MG ) Take as directed on package: start with two-5mg  tablets twice daily for 7 days. On day 8, switch to one-5mg  tablet twice daily. 02/18/24  Yes Haydee Lipa, MD  aspirin  81 MG tablet Take 1 tablet (81 mg total) by mouth daily. 03/29/18  Yes Gollan, Timothy J, MD  BREO ELLIPTA  100-25 MCG/ACT AEPB Inhale 1 puff into the lungs daily.   Yes [provider]  Coenzyme Q10 (CO Q10) 100 MG CAPS Take 100 mg by mouth daily.   Yes [provider]  empagliflozin  (JARDIANCE ) 10 MG  TABS tablet Take 1 tablet (10 mg total) by mouth daily before breakfast. 12/10/23  Yes Hackney, Tina A, FNP  ezetimibe  (ZETIA ) 10 MG tablet Take 1 tablet (10 mg total) by mouth daily. 12/20/23  Yes Gollan, Timothy J, MD  furosemide  (LASIX ) 40 MG tablet Take 1 tablet (40 mg total) by mouth daily. 12/03/23  Yes Donnie Galea, MD  gabapentin  (NEURONTIN ) 400 MG capsule Take 1 capsule (400 mg total) by mouth 3 (three) times daily. 11/12/23  Yes Shah, Vipul, MD  ipratropium (ATROVENT) 0.03 % nasal spray Place 2 sprays into both nostrils 2 (two) times daily as needed for rhinitis. 05/26/23  Yes [provider]  loratadine  (CLARITIN ) 10 MG tablet Take 10 mg by mouth daily.   Yes [provider]  metoprolol  succinate (TOPROL -XL) 25 MG 24 hr tablet TAKE 1/2 TABLET AT BEDTIME 01/10/24  Yes Gollan, Timothy J, MD  potassium chloride  SA (KLOR-CON  M) 20 MEQ tablet Take 2 tablets (40 mEq total) by mouth daily. 12/03/23  Yes Donnie Galea, MD  rosuvastatin  (CRESTOR ) 20 MG tablet Take 1 tablet (20 mg total) by mouth daily. 12/20/23  Yes Gollan, Timothy J, MD  vitamin B-12 (CYANOCOBALAMIN ) 1000 MCG tablet Take 1 tablet (1,000 mcg total) by mouth daily. 04/24/18  Yes Donnie Galea, MD  famotidine  (PEPCID ) 20 MG tablet Take 1 tablet (20 mg total) by mouth daily. 02/18/24   Haydee Lipa, MD  food thickener (SIMPLYTHICK, NECTAR/LEVEL 2/MILDLY THICK,) GEL Take 1 packet by mouth as needed. 02/18/24 03/19/24  Haydee Lipa, MD  hydrALAZINE  (APRESOLINE ) 25 MG tablet Take 1 tablet (25 mg total) by mouth every 6 (six) hours.  02/18/24   Haydee Lipa, MD  Multiple Vitamin (MULTIVITAMIN WITH MINERALS) TABS tablet Place 1 tablet into feeding tube daily. 02/18/24   Haydee Lipa, MD  Nutritional Supplements (FEEDING SUPPLEMENT, JEVITY 1.2 CAL,) LIQD Place 1,000 mLs into feeding tube continuous. 02/18/24   Haydee Lipa, MD  omeprazole  (PRILOSEC) 20 MG capsule Take 1 capsule (20 mg total)  by mouth 2 (two) times daily before a meal. Patient not taking: Reported on 02/07/2024 02/05/22   Donnie Galea, MD  polyethylene glycol powder (GLYCOLAX /MIRALAX ) 17 GM/SCOOP powder Take 17 g by mouth 2 (two) times daily as needed. 10/26/22   Donnie Galea, MD  rosuvastatin  (CRESTOR ) 40 MG tablet Take 1 tablet (40 mg total) by mouth daily. 02/18/24   Haydee Lipa, MD  sertraline  (ZOLOFT ) 100 MG tablet TAKE ONE AND A HALF TABLETS BY MOUTH EVERY DAY Patient taking differently: Take 150 mg by mouth daily. 12/02/23   Donnie Galea, MD  sertraline  (ZOLOFT ) 50 MG tablet Take 3 tablets (150 mg total) by mouth daily. 02/18/24   Haydee Lipa, MD     Vital Signs: BP (!) 136/54 (BP Location: Left Arm)   Pulse 61   Temp 98.5 F (36.9 C) (Oral)   Resp 12   Ht 5' 2 (1.575 m)   Wt 137 lb 9.1 oz (62.4 kg)   SpO2 94%   BMI 25.16 kg/m   Physical Exam  Skin:    Comments: Gastrostomy tube site is clean, dry, without signs of infection.  No discharge/leaking present.  Site dressed appropriately.   Gastrostomy tube intact.     Neurological:     Mental Status: She is alert. Mental status is at baseline. She is disoriented.    Labs:  CBC: Recent Labs    02/10/24 0723 02/11/24 0643 02/15/24 0955 02/18/24 0715  WBC 7.2 7.8 9.5 11.7*  HGB 11.0* 11.8* 11.8* 10.9*  HCT 35.6* 38.8 38.1 35.4*  PLT 250 251 288 325    COAGS: Recent Labs    02/07/24 1419 02/17/24 0605  INR 1.0 1.1  APTT 24  --     BMP: Recent Labs    02/11/24 0643 02/15/24 0826 02/16/24 0549 02/18/24 0715  NA 144 142 140 143  K 3.9 5.3* 4.8 4.0  CL 111 109 106 107  CO2 24 21* 27 25  GLUCOSE 97 87 97 122*  BUN 17 21 20  27*  CALCIUM  8.9 8.7* 8.7* 8.8*  CREATININE 0.77 0.78 0.75 0.84  GFRNONAA >60 >60 >60 >60    LIVER FUNCTION TESTS: Recent Labs    09/20/23 1334 02/07/24 1419 02/08/24 0525  BILITOT 0.4 0.4 0.7  AST 22 22 25   ALT 14 14 15   ALKPHOS 112 101 110  PROT 7.0 6.7 7.1   ALBUMIN 4.7 4.0 4.0    Assessment and Plan:  Pt is with dysphagia s/p image guided gastrostomy tube placement 02/17/24 Dr. Nereida Banning.    Pt seen today after gastrostomy tube placement.  Gastrostomy tube site is clean, dry, without signs of infection. Appropriate level of tenderness around insertion site.  Site is dressed appropriately.   Tube is intact and ready for immediate use.    Discussed with family that T tacks are present and may be noted in stool without reason for concern. Discussed need for immediate presentation to ER if tube is accidentally removed as the tract will quickly close.   Please call IR with any questions.    Electronically Signed: Terressa Fess,  NP 02/18/2024, 11:28 AM    I spent a total of 15 Minutes at the the patient's bedside AND on the patient's hospital floor or unit, greater than 50% of which was counseling/coordinating care for s/p image guided gastrostomy tube placement 02/17/24 Dr. Nereida Banning.

## 2024-02-18 NOTE — TOC Transition Note (Signed)
 Transition of Care [facility]) - Discharge Note   Patient Details  Name: Catherine Green MRN: [id number] Date of Birth: 1941-08-22  [facility]) CM/SW Contact:  Arron Big, LCSWA Phone Number: 02/18/2024, 2:04 PM   Clinical Narrative:   Patient will DC to: [facility] in [place]  Anticipated DC date: 02/18/24  Family notified: Royston Cornea (spouse) Transport by: Lyna Sandhoff   Per MD patient ready for DC to [facility] in [place]. RN to call report prior to discharge [phone]). RN, patient, patient's family, and facility notified of DC. Discharge Summary and FL2 sent to facility. DC packet on chart. Ambulance transport requested for patient at 2:04PM.   CSW will sign off for now as social work intervention is no longer needed. Please consult us  again if new needs arise.      Final next level of care: Skilled Nursing Facility Barriers to Discharge: Barriers Resolved   Patient Goals and CMS Choice Patient states their goals for this hospitalization and ongoing recovery are:: Rehab CMS Medicare.gov Compare Post Acute Care list provided to:: Patient Represenative (must comment) Choice offered to / list presented to : Spouse, Adult Children [place] ownership interest in [facility].provided to:: Spouse    Discharge Placement              Patient chooses bed at: [facility] Patient to be transferred to facility by: [facility] Name of family member notified: Royston Cornea (spouse) Patient and family notified of of transfer: 02/18/24  Discharge Plan and Services Additional resources added to the After Visit Summary for   In-house Referral: Clinical Social Work   Post Acute Care Choice: Skilled Nursing Facility                               Social Drivers of Health (SDOH) Interventions SDOH Screenings   Food Insecurity: No Food Insecurity (11/15/2023)  Housing: Low Risk  (11/15/2023)  Transportation Needs: No Transportation Needs  (11/15/2023)  Utilities: Not At Risk (11/15/2023)  Alcohol Screen: Low Risk  (05/25/2023)  Depression (PHQ2-9): Low Risk  (05/25/2023)  Financial Resource Strain: Low Risk  (11/12/2023)  Physical Activity: Sufficiently Active (05/25/2023)  Social Connections: Moderately Integrated (11/11/2023)  Stress: No Stress Concern Present (05/25/2023)  Tobacco Use: Medium Risk (02/07/2024)  Health Literacy: Adequate Health Literacy (05/25/2023)     Readmission Risk Interventions     No data to display

## 2024-02-18 NOTE — Progress Notes (Signed)
 Attempt x2 to call report  to [facility].  Again the phone rang but went unaswered

## 2024-02-18 NOTE — Plan of Care (Signed)
  Problem: Education: Goal: Knowledge of disease or condition will improve Outcome: Progressing Goal: Knowledge of patient specific risk factors will improve (DELETE if not current risk factor) Outcome: Progressing   Problem: Ischemic Stroke/TIA Tissue Perfusion: Goal: Complications of ischemic stroke/TIA will be minimized Outcome: Progressing   Problem: Coping: Goal: Will identify appropriate support needs Outcome: Progressing   Problem: Clinical Measurements: Goal: Will remain free from infection Outcome: Progressing Goal: Diagnostic test results will improve Outcome: Progressing Goal: Respiratory complications will improve Outcome: Progressing   Problem: Activity: Goal: Risk for activity intolerance will decrease Outcome: Progressing

## 2024-02-18 NOTE — Plan of Care (Signed)

## 2024-02-22 DIAGNOSIS — I4891 Unspecified atrial fibrillation: Secondary | ICD-10-CM | POA: Diagnosis not present

## 2024-02-22 DIAGNOSIS — F411 Generalized anxiety disorder: Secondary | ICD-10-CM | POA: Diagnosis not present

## 2024-02-22 DIAGNOSIS — I69351 Hemiplegia and hemiparesis following cerebral infarction affecting right dominant side: Secondary | ICD-10-CM | POA: Diagnosis not present

## 2024-02-22 DIAGNOSIS — I110 Hypertensive heart disease with heart failure: Secondary | ICD-10-CM | POA: Diagnosis not present

## 2024-02-22 DIAGNOSIS — E782 Mixed hyperlipidemia: Secondary | ICD-10-CM | POA: Diagnosis not present

## 2024-02-22 DIAGNOSIS — I63412 Cerebral infarction due to embolism of left middle cerebral artery: Secondary | ICD-10-CM | POA: Diagnosis not present

## 2024-02-22 DIAGNOSIS — K219 Gastro-esophageal reflux disease without esophagitis: Secondary | ICD-10-CM | POA: Diagnosis not present

## 2024-02-22 DIAGNOSIS — J449 Chronic obstructive pulmonary disease, unspecified: Secondary | ICD-10-CM | POA: Diagnosis not present

## 2024-02-22 DIAGNOSIS — I69320 Aphasia following cerebral infarction: Secondary | ICD-10-CM | POA: Diagnosis not present

## 2024-02-22 DIAGNOSIS — I509 Heart failure, unspecified: Secondary | ICD-10-CM | POA: Diagnosis not present

## 2024-02-22 DIAGNOSIS — F331 Major depressive disorder, recurrent, moderate: Secondary | ICD-10-CM | POA: Diagnosis not present

## 2024-02-22 DIAGNOSIS — R1312 Dysphagia, oropharyngeal phase: Secondary | ICD-10-CM | POA: Diagnosis not present

## 2024-02-22 NOTE — Progress Notes (Deleted)
 NEUROLOGY FOLLOW UP OFFICE NOTE  Catherine Green [id number]  Subjective:  Catherine Green is a 83 y.o. year old female with a history of lumbar spine disease s/p surgery (~2018), HTN, HLD, CAD s/p CABG (2004), carotid stenosis s/p surgery on both sides multiple times, CHF, afib, depression, PVD, edema (on lasix ), previous smoker who we last saw on 01/28/24 for pain and weakness in her legs.  To briefly review: 07/30/22: Patient has had weakness off and on for 2-3 years. The weakness is in her legs. It is especially bad when it is cold. She gets pain in her back, front of her legs, and down below knee. It is an electric pain. She can have leg soreness the day after bad electric pains. It has been getting worse. She states this has been going on for a while (not sure exactly when). She finds when she walks, things are worse. When she sits down, symptoms improve. When she is in a store walking, hanging on a cart helps her move better. She will use a walker in her condo.   Patient had one fall, on 07/07/22 when she fell at the dentist. She has almost fallen other times but grabs onto her husband.   She has a history of lumbar spine disease. She had surgery in ~2018 and has been told everything has been done that can be done. She previously got injections as well.   She takes gabapentin  400 mg every 8 hours for pain. This helps but causes some cognitive slowing for her. She takes B12 1000 mcg daily. She was previously on crestor  20 mg and zetia  10 mg daily for many years. She recently stopped the crestor  but is still on the zetia  due to concerns for weakness on 07/17/22. This has not changed her symptoms.    Patient denies diplopia, ptosis, problems chewing, changes to her voice. She occasionally gets choked when eating bread or something she has to chew a lot. She occasionally coughs when drinking water. She has had this looked at with endoscopy years ago and a polyp was removed. She does not think this  helped.   There are no neuromuscular respiratory weakness symptoms, particularly orthopnea>dyspnea.    Pseudobulbar affect is absent.   She endorses dry eyes and mouth. She denies bowel or bladder problems, early satiety, or postprandial bloating.   She has lost 30 pounds over the last year but stable over the last year. Patient feels this was due to taking lasix  and loss of edema.   EtOH use: None  Restrictive diet? No Family history of neuropathy/myopathy/NM disease? No   Patient had an EMG in 2019, prior to symptoms in her legs (normal EMG).   11/12/22: Labs were normal. EMG on 09/28/22 was normal with no evidence of neuropathy, myopathy, or left cervical or lumbosacral radiculopathy.   Patient's symptoms improved with therapy. She finished therapy recently and feels like she has returned to previously low. She has home exercises. She states she was keeping up with the exercises until she got sick about 3 weeks. She has a sinus infection and has felt dizzy and hearing worse than normal.   She has had no falls. She uses a walker at home.   02/10/23: Labs were normal. MRI cervical spine confirmed spinal stenosis, so patient was referred to spine surgery. Patient had C5-C7 fusion on 01/11/23.    She mentions that she is still weak. She is having difficulty swallowing, which she states is new from surgery.  It started a couple of weeks after surgery. Her voice is weaker as well.   She thinks overall she is walking better. She has not had any falls. Her legs have not given out on her. She denies any neck pain or numbness or tingling in arms or legs. She denies orthopnea. She denies muscle twitching.  01/28/24: Patient is not able to stand or walk. She has a lot of pain in her legs. It is throughout both legs. It feels like electric shocks. It feels like it is coming from her low back. If she stands and walks her legs get weak. When her legs are bothering her, she can lose her bowel or bladder.  When she sits she gets some relief. She denies saddle anesthesia.   She has fallen multiple times.   At least visit, patient mentioned difficulty swallowing after cervical spine surgery. This improved.   She also mentions headaches, perhaps due to more reading, but this is not a pressing concern. It may also have been due to allergies.  Most recent Assessment and Plan (01/28/24): This is Catherine Green, a 83 y.o. female with difficulty ambulating due to back pain radiating into legs. Symptoms will improve if she sits. She has had multiple falls. She has also had episodes of bowel and bladder incontinence with her symptoms. This is most concerning from lumbar stenosis with neurogenic claudication, perhaps also with pressure on conus or cauda equina. I will get MRI of lumbar spine stat and get her back with her spine surgeon.    Plan: -MRI lumbar spine wo contrast - will order STAT -Recommend patient see her spine surgeon, Dr. Malcolm, will send my notes -Discussed reasons to go to ED including inability to move legs, hold bowel or bladder, or saddle anesthesia  Since their last visit: MRI lumbar spine showed new disc protrusion at L3-4 that displaced left L4 nerve and possible disc bulge impressing the right L5 nerve. I recommended see see Dr Malcolm at [facility].  Patient then had another fall on 02/07/24 in the morning. Later that afternoon, her husband noticed patient had not spoken to him since the fall, so he called EMS. iNIHSS was 23 (LOC, left gaze, visual fields, right facial palsy, right arm and leg, left leg, aphasia, and dysarthria).  CT head showed acute left MCA territory infarct with an ASPECTS of 3. CTA head and neck showed occlusion of left ICA and proximal left A1 and M1 segments. Mechanical thrombectomy was discussed with family but they declined procedure. They discussed living will and wish to be DNR/DNI. Patient was given aspirin  300 mg and admitted to the hospital.  Telemetry was concerning for afib. Cardiology was consulted and patient was started on on Eliquis  5 mg BID. Patient was unable to safely swallow so PEG was placed on 02/17/24. Patient was discharged to SNF on 02/18/24.  Since discharge, ***  MEDICATIONS:  Outpatient Encounter Medications as of 03/02/2024  Medication Sig Note   albuterol  (VENTOLIN  HFA) 108 (90 Base) MCG/ACT inhaler Inhale 2 puffs into the lungs every 6 (six) hours as needed for wheezing or shortness of breath.    amLODipine  (NORVASC ) 2.5 MG tablet Take 1 tablet (2.5 mg total) by mouth in the morning.    APIXABAN  (ELIQUIS ) VTE STARTER PACK (10MG  AND 5MG ) Take as directed on package: start with two-5mg  tablets twice daily for 7 days. On day 8, switch to one-5mg  tablet twice daily.    BREO ELLIPTA  100-25 MCG/ACT AEPB Inhale  1 puff into the lungs daily. 02/07/2024: Looks to have possibly not been filled since 10/2023 (??)   Coenzyme Q10 (CO Q10) 100 MG CAPS Take 100 mg by mouth daily.    empagliflozin  (JARDIANCE ) 10 MG TABS tablet Take 1 tablet (10 mg total) by mouth daily before breakfast.    ezetimibe  (ZETIA ) 10 MG tablet Take 1 tablet (10 mg total) by mouth daily.    famotidine  (PEPCID ) 20 MG tablet Take 1 tablet (20 mg total) by mouth daily.    food thickener (SIMPLYTHICK, NECTAR/LEVEL 2/MILDLY THICK,) GEL Take 1 packet by mouth as needed.    gabapentin  (NEURONTIN ) 400 MG capsule Take 1 capsule (400 mg total) by mouth 3 (three) times daily.    hydrALAZINE  (APRESOLINE ) 25 MG tablet Take 1 tablet (25 mg total) by mouth every 6 (six) hours.    ipratropium (ATROVENT) 0.03 % nasal spray Place 2 sprays into both nostrils 2 (two) times daily as needed for rhinitis.    loratadine  (CLARITIN ) 10 MG tablet Take 10 mg by mouth daily.    Multiple Vitamin (MULTIVITAMIN WITH MINERALS) TABS tablet Place 1 tablet into feeding tube daily.    Nutritional Supplements (FEEDING SUPPLEMENT, JEVITY 1.2 CAL,) LIQD Place 1,000 mLs into feeding tube continuous.     polyethylene glycol powder (GLYCOLAX /MIRALAX ) 17 GM/SCOOP powder Take 17 g by mouth 2 (two) times daily as needed.    rosuvastatin  (CRESTOR ) 40 MG tablet Take 1 tablet (40 mg total) by mouth daily.    sertraline  (ZOLOFT ) 50 MG tablet Take 3 tablets (150 mg total) by mouth daily.    vitamin B-12 (CYANOCOBALAMIN ) 1000 MCG tablet Take 1 tablet (1,000 mcg total) by mouth daily.    No facility-administered encounter medications on file as of 03/02/2024.    PAST MEDICAL HISTORY: Past Medical History:  Diagnosis Date   Acute hypoxic respiratory failure (HCC) 11/09/2023   Acute non-recurrent maxillary sinusitis 10/28/2022   Allergy    hay fever   Anemia    Anxiety    Back pain    Carotid artery occlusion    Cerebrovascular disease    extracranial; occlusive   Chicken pox    Coronary artery disease    Depression    Dizziness    Dizziness    DVT (deep venous thrombosis) (HCC)    Fainting spell    GERD (gastroesophageal reflux disease)    Headache    Heart murmur    Hyperlipidemia    Hypertension    Leg pain    Mitral regurgitation    PONV (postoperative nausea and vomiting)    severe nausea and vomiting   Pre-syncope    PVD (peripheral vascular disease) (HCC)    endarterectomy by Dr. Oris   Seasonal allergies    Shortness of breath dyspnea    wth ambulation at times   Swelling of both ankles    and abdomen; takes Lasix  when needed   Thrombophlebitis    following childbirth   Ulcer     PAST SURGICAL HISTORY: Past Surgical History:  Procedure Laterality Date   ANTERIOR CERVICAL DECOMP/DISCECTOMY FUSION N/A 01/11/2023   Procedure: Anterior Cervical Decompression/Discectomy Fusion Cervical Five-Cervical Six - Cervical Six-Cervical Seven;  Surgeon: Louis Shove, MD;  Location: [name] OR;  Service: Neurosurgery;  Laterality: N/A;  3C   APPENDECTOMY     BREAST SURGERY     saline implants   CARDIAC CATHETERIZATION     CAROTID ENDARTERECTOMY  1992   CAROTID ENDARTERECTOMY Right  02/13/2005   Re-do  Right CE   CATARACT EXTRACTION, BILATERAL  2013   CHOLECYSTECTOMY, LAPAROSCOPIC  04/02/2014   Dr. Unknown Sharps   CORONARY ARTERY BYPASS GRAFT  2006   x3 Dr. Fleeta Ochoa   ENDARTERECTOMY Left 07/20/2014   Procedure: ENDARTERECTOMY CAROTID-LEFT;  Surgeon: Krystal JULIANNA Doing, MD;  Location: [facility] OR;  Service: Vascular;  Laterality: Left;   EYE SURGERY Bilateral 10/2011   Cataract Left eye   IR GASTROSTOMY TUBE MOD SED  02/17/2024   SEPTOPLASTY     with bilateral inferior turbinate reductions   SPINE SURGERY  12/2015   TOTAL ABDOMINAL HYSTERECTOMY     UPPER GASTROINTESTINAL ENDOSCOPY     had polyps removed from esophagus   UPPER GI ENDOSCOPY      ALLERGIES: Allergies  Allergen Reactions   Lyrica  [Pregabalin ] Shortness Of Breath, Swelling and Other (See Comments)    Chest tightness, also    Hydrocodone  Other (See Comments)    Hallucinations    Ivp Dye [Iodinated Contrast Media] Hives and Itching        Lipitor [Atorvastatin] Nausea And Vomiting   Methylprednisolone  Other (See Comments)    Severe weekness    Oxycodone  Hcl Other (See Comments)    Hallucinations    Penicillin G Nausea And Vomiting   Shellfish Allergy Hives and Itching   Buspar  [Buspirone ] Other (See Comments)    Worsening mood   Clavulanic Acid Other (See Comments)    Reaction not noted   Prednisone  Other (See Comments)    Reaction not noted   Codeine Nausea And Vomiting and Other (See Comments)   Cymbalta  [Duloxetine  Hcl] Nausea Only    GI upset at 30mg    Demerol Nausea And Vomiting   Doxycycline  Nausea Only and Other (See Comments)    Weakness, sick to her stomach   Effexor  [Venlafaxine ] Nausea Only   Hydrochlorothiazide  Other (See Comments)    Not an allergy but urinary frequency and sweating at 25mg    Hydroxyzine  Other (See Comments)    Excessive sweating.    Metoprolol  Other (See Comments)    Slowed body down, per pt    Protonix  [Pantoprazole  Sodium] Nausea Only   Trazodone  And  Nefazodone Other (See Comments)    Sedation     FAMILY HISTORY: Family History  Problem Relation Age of Onset   Aneurysm Mother        brain   Heart disease Mother        Aneyursm    Hyperlipidemia Mother    Hypertension Mother    Varicose Veins Mother    Bleeding Disorder Mother    Heart disease Father    Cirrhosis Father    Heart attack Father    Hyperlipidemia Father    Hypertension Father    Cancer Sister        lung   Heart disease Sister        Aneurysm   Hyperlipidemia Sister    Hypertension Sister    Varicose Veins Sister    Bleeding Disorder Sister    Heart disease Brother        Before age 48   Aneurysm Brother    Deep vein thrombosis Brother    Birth defects Brother    Hyperlipidemia Brother    Hypertension Brother    Peripheral vascular disease Daughter    Hyperlipidemia Daughter    Hypertension Daughter    Hypertension Other    Breast cancer Neg Hx    Colon cancer Neg Hx     SOCIAL HISTORY:  Social History   Tobacco Use   Smoking status: Former    Current packs/day: 0.00    Average packs/day: 0.3 packs/day for 15.0 years (4.5 ttl pk-yrs)    Types: Cigarettes    Start date: 08/31/1981    Quit date: 08/31/1996    Years since quitting: 27.4    Passive exposure: Never   Smokeless tobacco: Never  Vaping Use   Vaping status: Never Used  Substance Use Topics   Alcohol use: No    Alcohol/week: 0.0 standard drinks of alcohol   Drug use: No   Social History   Social History Narrative   Lives with husband in a one level    Work - retired Producer, television/film/video   Diet - healthy   Right handed    Caffeine- 1 cup per day.      Had 1 daughter. Daughter is deceased      Objective:  Vital Signs:  There were no vitals taken for this visit.  ***  Labs and Imaging review: New results: 02/08/24: HbA1c: 5.8 Lipid panel: tChol 283, LDL 199, TG 71  MRI lumbar spine wo contrast (02/03/24): IMPRESSION: Compared to 11/12/2020:   1. Transitional lumbosacral  anatomy. Recommend correlation with the numbering nomenclature in this report prior to any image guided invasive procedure. 2. At L3-4, there is a new left lateral recess posterior disc protrusion which minimally posteriorly displaces the descending left L4 nerve within the left lateral recess. New moderate left lateral recess stenosis. Mildly worsened mild-to-moderate left neuroforaminal stenosis. 3. At L4-5, there are postsurgical changes of right hemilaminotomy. Moderate broad-based posterior disc bulge minimally impresses on the descending right L5 nerve within the right lateral recess. Mild-to-moderate right and mild left lateral recess stenosis. 4. At L2-3, there is mild left neuroforaminal stenosis, unchanged to minimally worsened from prior.  CT head wo contrast (02/07/24): ASPECTS [facility] Stroke Program Early CT Score)   - Ganglionic level infarction (caudate, lentiform nuclei, internal capsule, insula, M1-M3 cortex): 1   - Supraganglionic infarction (M4-M6 cortex): 2   Total score (0-10 with 10 being normal): 3   These results were communicated to Dr. Lindzen at 2:33 pm on 02/07/2024 by text page via the [facility] messaging system.   IMPRESSION: Acute left MCA territory infarct. ASPECTS of 3.  CTA head and neck (02/07/24): FINDINGS: CTA NECK FINDINGS   Aortic arch: Standard branching with prominent calcified plaque involving the great vessel origins including a proximally 75% stenosis of the origin of the brachiocephalic artery, similar to the prior CTA.   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 Similar appearance of calcified and ulcerated soft plaque in this region. Widely patent ICA.   Left carotid system: Patent with 50% stenosis of the common carotid artery origin and less than 50% stenosis of the proximal  ICA, similar to the prior CTA.   Vertebral arteries: Patent without evidence of a significant stenosis or dissection within limitation of streak artifact which obscures a portion of the right V1 segment. Element left vertebral artery.   Skeleton: C5-C7 ACDF. Advanced upper cervical facet arthrosis. C3-4 facet ankylosis.   Other neck: No evidence of cervical lymphadenopathy or mass.   Upper chest: Partially visualized small to moderate bilateral pleural effusions with associated atelectasis. Status post CABG.   Review of the MIP images confirms the above findings   CTA HEAD FINDINGS   Anterior  circulation: The intracranial left ICA is patent proximally, however there is progressively diminished opacification more distally with the vessel appearing occluded beginning in the cavernous segment through the terminus. The left M1 and proximal left A1 segments are also occluded. There is reconstitution of the left ACA via the anterior communicating artery. There is mild-to-moderate reconstitution of the distal left M1 segment and left MCA branch vessels. The intracranial right ICA is patent with mild nonstenotic atherosclerosis. The right ACA and right MCA are patent without evidence of a significant proximal stenosis. No aneurysm is identified.   Posterior circulation: The intracranial vertebral arteries are patent to the basilar with the right being hypoplastic distal to the PICA origin. Patent PICA and SCA origins are visualized bilaterally. The basilar artery is patent and mildly irregular without a significant stenosis. Posterior communicating arteries are diminutive or absent. Both PCAs are patent without evidence of a significant proximal stenosis. No aneurysm is identified.   Venous sinuses: As permitted by contrast timing, patent.   Anatomic variants: None.   Review of the MIP images confirms the above findings   CT Brain Perfusion Findings:   CT perfusion imaging is  felt to be of limited diagnostic utility due to bolus timing, with the arterial upstroke being incompletely captured and without a good venous outflow curve. The perfusion software does not detect the large left MCA infarct which is apparent on the earlier noncontrast head CT and reports 22 mL of mismatch volume.   These results were communicated to Dr. Lindzen at 3:21 pm on 02/07/2024 by text page via the [facility] messaging system.   IMPRESSION: 1. 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 2. Limited CT perfusion imaging as described above. 3. 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 4. Unchanged 50% stenosis of the origin of the left common carotid artery and 75% stenosis of the brachiocephalic artery. 5. Partially visualized small to moderate bilateral pleural effusions. 6.  Aortic Atherosclerosis (ICD10-I70.0).  MRI brain wo contrast (02/07/24): FINDINGS: Brain: Cerebral volume within normal limits. Patchy T2/FLAIR hyperintensity involving the periventricular and deep white matter both cerebral hemispheres the consistent with chronic small vessel ischemic disease, mild to moderate in nature.   Confluent restricted diffusion involving the left frontal lobe, consistent with a moderate size evolving left MCA territory infarct. Involvement of the left insula and left basal ganglia noted. No associated hemorrhage or significant regional mass effect. No other evidence for acute or subacute ischemia. No acute intracranial hemorrhage. Few punctate chronic micro hemorrhages noted involving the right cerebellum and right cerebral hemisphere.   No mass lesion, midline shift or mass effect. No hydrocephalus or extra-axial fluid collection. Pituitary gland within normal limits.   Vascular: Loss of normal flow void within the left ICA and MCA, consistent with occlusion as seen on prior CTA.  Major intracranial vascular flow voids are otherwise maintained.   Skull and upper cervical spine: Craniocervical junction within normal limits. Bone marrow signal intensity normal. No scalp soft tissue abnormality.   Sinuses/Orbits: Prior bilateral ocular lens replacement. Paranasal sinuses are largely clear. No mastoid effusion.   Other: None.   IMPRESSION: 1. Moderate sized acute left MCA territory infarct. No associated hemorrhage or significant regional mass effect. 2. Loss of normal flow void within the left ICA and MCA, consistent with occlusion as seen on prior CTA. 3. Underlying mild to moderate chronic microvascular ischemic disease.  Echocardiogram (02/09/24): 1. Left ventricular ejection fraction,  by estimation, is 60 to 65%. The  left ventricle has normal function. The left ventricle has no regional  wall motion abnormalities. Left ventricular diastolic parameters are  consistent with Grade III diastolic  dysfunction (restrictive).   2. Right ventricular systolic function is mildly reduced. The right  ventricular size is normal. There is mildly elevated pulmonary artery  systolic pressure.   3. Left atrial size was moderately dilated.   4. The mitral valve is normal in structure. Trivial mitral valve  regurgitation. No evidence of mitral stenosis.   5. The aortic valve is grossly normal. There is mild calcification of the  aortic valve. There is mild thickening of the aortic valve. Aortic valve  regurgitation is not visualized. Aortic valve sclerosis/calcification is  present, without any evidence of  aortic stenosis.   6. The inferior vena cava is normal in size with greater than 50%  respiratory variability, suggesting right atrial pressure of 3 mmHg.   Comparison(s): No significant change from prior study.   Conclusion(s)/Recommendation(s): Otherwise normal echocardiogram, with  minor abnormalities described in the report. No intracardiac source of  embolism  detected on this transthoracic study. Consider a transesophageal  echocardiogram to exclude cardiac  source of embolism if clinically indicated.   Previously reviewed results: BMP (12/10/23) unremarkable CBC (11/12/23) unremarkable TSH (09/20/23) wnl   11/11/21: Vit E wnl Copper  wnl   07/30/22: HMGCR ab: < 2 Vit E wnl Copper  wnl B1 wnl IFE: no M protein ANA/ENA wnl   07/17/22 labs: CK: 134 (409 four years ago) B12: 874 TSH: 1.02 ESR: 25 CBC and CMP wnl   HbA1c (02/07/22): 5.8   MRI lumbar spine wo contrast (11/12/20): IMPRESSION: 1. Consistent with the previous exams, L5 is a transitional vertebra, largely sacralized. 2. No change since the study 05/25/2019. Mild chronic degenerative changes at L2-3, L3-4 and L4-5 with disc bulges and ligamentous prominence but no apparent neural compression. Findings could contribute to back pain. Previous right laminotomy L4-5.   MRI brain w/wo contrast (08/03/18): IMPRESSION: No cause for hearing loss. Negative for vestibular schwannoma or other mass in the posterior fossa.   Mild chronic microvascular ischemic change in the cerebral white matter bilaterally.   EMG (05/26/18 - [facility]): FINDINGS: NERVE CONDUCTION STUDY: Bilateral tibial and peroneal motor responses are normal. Bilateral sural and superficial peroneal sensory responses are normal. Bilateral tibial F wave latencies are normal. NEEDLE ELECTROMYOGRAPHY: Needle examination of left upper extremity and left lower extremity is unremarkable. IMPRESSION:  This is a normal study. No electrodiagnostic evidence of large fiber neuropathy or myopathy at this time.   EMG (09/28/22): NCV & EMG Findings: Extensive electrodiagnostic evaluation of the left upper and lower limbs show: Left sural, superficial peroneal/fibular, median, ulnar, and radial sensory responses are within normal limits. Left peroneal/fibular (EDB), tibial (AH), median (APB), and ulnar (ADM) motor responses are  within normal limits. Left H reflex latency is within normal limits. There is no evidence of active or chronic motor axon loss changes affecting any of the tested muscles on needle examination. Motor unit configuration and recruitment pattern is within normal limits.   Impression: This is a normal electrodiagnostic evaluation. There is no electrodiagnostic evidence of a large fiber peripheral neuropathy, myopathy, left lumbosacral (L2-S1) motor radiculopathy, or left cervical (C5-C8) motor radiculopathy.   MRI cervical spine wo contrast (12/05/22): IMPRESSION: 1. Degenerative spondylosis at C5-6 with endplate osteophytes and broad-based disc herniation more prominent to the right of midline. Spinal stenosis with AP diameter of the canal  only 6.5 mm. Effacement of the subarachnoid space and some deformity of the cord, more on the right. Early abnormal T2 signal within the cord at this level. Mild to moderate bilateral foraminal stenosis. Patient would be at risk of compressive myelopathy based on these findings. 2. C6-7: Endplate osteophytes and broad-based disc herniation with slight caudal down turning. Spinal stenosis with AP diameter of the canal only 6.7 mm. Mild cord deformity. Moderate bilateral foraminal stenosis. Some risk of compressive myelopathy at this level as well. 3. C4-5: Endplate osteophytes and small central disc protrusion. AP diameter of the canal 8.2 mm. Mild left foraminal narrowing. 4. Chronic facet fusion on the right at C3-4.   Cervical spine xray (01/11/23): FINDINGS: Two intraoperative fluoroscopic views of the cervical spine. Alignment is anatomic. C5, C6, C7 anterior fusion plate is present. 10 seconds of fluoro time. Dose: 1.06 micro gray.   IMPRESSION: C5, C6, C7 anterior fusion.  Lumbar spine xray (09/20/23): IMPRESSION: 1. No fracture or acute finding. 2. Transitional vertebra and mild degenerative changes as detailed. Stable appearance from the prior  radiographs.  Assessment/Plan:  This is [name], a 83 y.o. female with: ***   Plan: ***Follow up with cardiology  Return to clinic in ***  Total time spent reviewing records, interview, history/exam, documentation, and coordination of care on day of encounter:  *** min  Venetia Potters, MD

## 2024-02-23 ENCOUNTER — Telehealth: Payer: Self-pay

## 2024-02-23 DIAGNOSIS — E782 Mixed hyperlipidemia: Secondary | ICD-10-CM | POA: Diagnosis not present

## 2024-02-23 DIAGNOSIS — I509 Heart failure, unspecified: Secondary | ICD-10-CM | POA: Diagnosis not present

## 2024-02-23 DIAGNOSIS — J449 Chronic obstructive pulmonary disease, unspecified: Secondary | ICD-10-CM | POA: Diagnosis not present

## 2024-02-23 DIAGNOSIS — I69351 Hemiplegia and hemiparesis following cerebral infarction affecting right dominant side: Secondary | ICD-10-CM | POA: Diagnosis not present

## 2024-02-23 DIAGNOSIS — I4891 Unspecified atrial fibrillation: Secondary | ICD-10-CM | POA: Diagnosis not present

## 2024-02-23 DIAGNOSIS — R1312 Dysphagia, oropharyngeal phase: Secondary | ICD-10-CM | POA: Diagnosis not present

## 2024-02-23 DIAGNOSIS — K219 Gastro-esophageal reflux disease without esophagitis: Secondary | ICD-10-CM | POA: Diagnosis not present

## 2024-02-23 DIAGNOSIS — R110 Nausea: Secondary | ICD-10-CM | POA: Diagnosis not present

## 2024-02-23 DIAGNOSIS — I63412 Cerebral infarction due to embolism of left middle cerebral artery: Secondary | ICD-10-CM | POA: Diagnosis not present

## 2024-02-23 DIAGNOSIS — I110 Hypertensive heart disease with heart failure: Secondary | ICD-10-CM | POA: Diagnosis not present

## 2024-02-23 DIAGNOSIS — F331 Major depressive disorder, recurrent, moderate: Secondary | ICD-10-CM | POA: Diagnosis not present

## 2024-02-23 DIAGNOSIS — I69320 Aphasia following cerebral infarction: Secondary | ICD-10-CM | POA: Diagnosis not present

## 2024-02-23 NOTE — Telephone Encounter (Signed)
 Copied from CRM [id number]. Topic: General - Other >> Feb 23, 2024 10:18 AM Sophia H wrote: Reason for CRM: **Message for Dr. Cleatus - just wanted him to be aware  Spoke with Mr. Carolan (husband) who states on June 9th Catherine Green had a massive stroke, was in the hospital up till last Friday night, now she is at [facility] in [place] but doesn't seem to be doing to well. Patient is on a feeding tube and they are hoping that she will get through this and get better.   If have any questions please call Mr. Schwering [phone]

## 2024-02-23 NOTE — Telephone Encounter (Signed)
 Late entry.  Called her husband earlier this afternoon.  Discussed situation.  Support offered.  The inpatient team had sent me her notes previously.  I will defer medication management while she is in the rehab facility.  He was asking about cost of medications/medication assistance after she gets out of the facility.  I told him we can work on that when I saw her in follow-up, after discharge.  I thanked him for taking the call and he thanked me for checking in with him.  I appreciate the help of all involved.

## 2024-02-25 DIAGNOSIS — J449 Chronic obstructive pulmonary disease, unspecified: Secondary | ICD-10-CM | POA: Diagnosis not present

## 2024-02-25 DIAGNOSIS — I4891 Unspecified atrial fibrillation: Secondary | ICD-10-CM | POA: Diagnosis not present

## 2024-02-25 DIAGNOSIS — R1312 Dysphagia, oropharyngeal phase: Secondary | ICD-10-CM | POA: Diagnosis not present

## 2024-02-25 DIAGNOSIS — I63412 Cerebral infarction due to embolism of left middle cerebral artery: Secondary | ICD-10-CM | POA: Diagnosis not present

## 2024-02-25 DIAGNOSIS — I69320 Aphasia following cerebral infarction: Secondary | ICD-10-CM | POA: Diagnosis not present

## 2024-02-25 DIAGNOSIS — F331 Major depressive disorder, recurrent, moderate: Secondary | ICD-10-CM | POA: Diagnosis not present

## 2024-02-25 DIAGNOSIS — I509 Heart failure, unspecified: Secondary | ICD-10-CM | POA: Diagnosis not present

## 2024-02-25 DIAGNOSIS — K219 Gastro-esophageal reflux disease without esophagitis: Secondary | ICD-10-CM | POA: Diagnosis not present

## 2024-02-25 DIAGNOSIS — E782 Mixed hyperlipidemia: Secondary | ICD-10-CM | POA: Diagnosis not present

## 2024-02-25 DIAGNOSIS — I69351 Hemiplegia and hemiparesis following cerebral infarction affecting right dominant side: Secondary | ICD-10-CM | POA: Diagnosis not present

## 2024-02-25 DIAGNOSIS — I110 Hypertensive heart disease with heart failure: Secondary | ICD-10-CM | POA: Diagnosis not present

## 2024-02-25 DIAGNOSIS — R110 Nausea: Secondary | ICD-10-CM | POA: Diagnosis not present

## 2024-02-28 DIAGNOSIS — K219 Gastro-esophageal reflux disease without esophagitis: Secondary | ICD-10-CM | POA: Diagnosis not present

## 2024-02-28 DIAGNOSIS — Z931 Gastrostomy status: Secondary | ICD-10-CM | POA: Diagnosis not present

## 2024-02-28 DIAGNOSIS — I69351 Hemiplegia and hemiparesis following cerebral infarction affecting right dominant side: Secondary | ICD-10-CM | POA: Diagnosis not present

## 2024-02-28 DIAGNOSIS — I110 Hypertensive heart disease with heart failure: Secondary | ICD-10-CM | POA: Diagnosis not present

## 2024-02-28 DIAGNOSIS — I63412 Cerebral infarction due to embolism of left middle cerebral artery: Secondary | ICD-10-CM | POA: Diagnosis not present

## 2024-02-28 DIAGNOSIS — R1312 Dysphagia, oropharyngeal phase: Secondary | ICD-10-CM | POA: Diagnosis not present

## 2024-02-28 DIAGNOSIS — I4891 Unspecified atrial fibrillation: Secondary | ICD-10-CM | POA: Diagnosis not present

## 2024-02-28 DIAGNOSIS — Z7901 Long term (current) use of anticoagulants: Secondary | ICD-10-CM | POA: Diagnosis not present

## 2024-02-28 DIAGNOSIS — F331 Major depressive disorder, recurrent, moderate: Secondary | ICD-10-CM | POA: Diagnosis not present

## 2024-02-28 DIAGNOSIS — I69320 Aphasia following cerebral infarction: Secondary | ICD-10-CM | POA: Diagnosis not present

## 2024-02-28 DIAGNOSIS — I509 Heart failure, unspecified: Secondary | ICD-10-CM | POA: Diagnosis not present

## 2024-03-01 DIAGNOSIS — I69351 Hemiplegia and hemiparesis following cerebral infarction affecting right dominant side: Secondary | ICD-10-CM | POA: Diagnosis not present

## 2024-03-01 DIAGNOSIS — I63412 Cerebral infarction due to embolism of left middle cerebral artery: Secondary | ICD-10-CM | POA: Diagnosis not present

## 2024-03-01 DIAGNOSIS — R1312 Dysphagia, oropharyngeal phase: Secondary | ICD-10-CM | POA: Diagnosis not present

## 2024-03-01 DIAGNOSIS — I509 Heart failure, unspecified: Secondary | ICD-10-CM | POA: Diagnosis not present

## 2024-03-01 DIAGNOSIS — K219 Gastro-esophageal reflux disease without esophagitis: Secondary | ICD-10-CM | POA: Diagnosis not present

## 2024-03-01 DIAGNOSIS — I4891 Unspecified atrial fibrillation: Secondary | ICD-10-CM | POA: Diagnosis not present

## 2024-03-01 DIAGNOSIS — I110 Hypertensive heart disease with heart failure: Secondary | ICD-10-CM | POA: Diagnosis not present

## 2024-03-01 DIAGNOSIS — F331 Major depressive disorder, recurrent, moderate: Secondary | ICD-10-CM | POA: Diagnosis not present

## 2024-03-01 DIAGNOSIS — Z7901 Long term (current) use of anticoagulants: Secondary | ICD-10-CM | POA: Diagnosis not present

## 2024-03-01 DIAGNOSIS — Z931 Gastrostomy status: Secondary | ICD-10-CM | POA: Diagnosis not present

## 2024-03-01 DIAGNOSIS — I69320 Aphasia following cerebral infarction: Secondary | ICD-10-CM | POA: Diagnosis not present

## 2024-03-02 ENCOUNTER — Ambulatory Visit: Admitting: Neurology

## 2024-03-06 DIAGNOSIS — Z931 Gastrostomy status: Secondary | ICD-10-CM | POA: Diagnosis not present

## 2024-03-06 DIAGNOSIS — I110 Hypertensive heart disease with heart failure: Secondary | ICD-10-CM | POA: Diagnosis not present

## 2024-03-06 DIAGNOSIS — I69320 Aphasia following cerebral infarction: Secondary | ICD-10-CM | POA: Diagnosis not present

## 2024-03-06 DIAGNOSIS — I69351 Hemiplegia and hemiparesis following cerebral infarction affecting right dominant side: Secondary | ICD-10-CM | POA: Diagnosis not present

## 2024-03-06 DIAGNOSIS — I4891 Unspecified atrial fibrillation: Secondary | ICD-10-CM | POA: Diagnosis not present

## 2024-03-06 DIAGNOSIS — K219 Gastro-esophageal reflux disease without esophagitis: Secondary | ICD-10-CM | POA: Diagnosis not present

## 2024-03-06 DIAGNOSIS — R1312 Dysphagia, oropharyngeal phase: Secondary | ICD-10-CM | POA: Diagnosis not present

## 2024-03-06 DIAGNOSIS — I63412 Cerebral infarction due to embolism of left middle cerebral artery: Secondary | ICD-10-CM | POA: Diagnosis not present

## 2024-03-06 DIAGNOSIS — F331 Major depressive disorder, recurrent, moderate: Secondary | ICD-10-CM | POA: Diagnosis not present

## 2024-03-06 DIAGNOSIS — Z7901 Long term (current) use of anticoagulants: Secondary | ICD-10-CM | POA: Diagnosis not present

## 2024-03-06 DIAGNOSIS — I509 Heart failure, unspecified: Secondary | ICD-10-CM | POA: Diagnosis not present

## 2024-03-08 ENCOUNTER — Telehealth: Payer: Self-pay | Admitting: Family Medicine

## 2024-03-08 ENCOUNTER — Telehealth: Payer: Self-pay

## 2024-03-08 NOTE — Telephone Encounter (Signed)
 Copied from CRM [id number]. Topic: Clinical - Home Health Verbal Orders >> Mar 08, 2024  1:45 PM Suzen RAMAN wrote: Caller/Agency: [facility] Callback Number: [phone] Service Requested: Occupational Therapy, Physical Therapy and Skill Nursing Frequency: Unknown per caller Any new concerns about the patient? No

## 2024-03-08 NOTE — Telephone Encounter (Signed)
 I received a call from Darice at [facility] she received a call from [employer] and [facility] needs to know if Dr Cleatus will be following pt for the home health order they received for Ot, PT and Nursing. Pt is supposed to be seen on this Friday for Nursing [facility] needs to know ASAP please call [phone]. Thank you!

## 2024-03-08 NOTE — Telephone Encounter (Signed)
 I spoke with Catherine Green [facility] and Catherine notified as instructed and Catherine voiced understanding and appreciative of cb. Catherine said [facility] needed PCP to give OK for [facility] to go out on 03/10/24 for start of care., HH would cb with more specific request of frequency of visits once pt was evaluated b y [facility]

## 2024-03-08 NOTE — Telephone Encounter (Signed)
 Please give the order.  Thanks.

## 2024-03-09 DIAGNOSIS — I69351 Hemiplegia and hemiparesis following cerebral infarction affecting right dominant side: Secondary | ICD-10-CM | POA: Diagnosis not present

## 2024-03-10 ENCOUNTER — Telehealth: Payer: Self-pay

## 2024-03-10 ENCOUNTER — Encounter: Admitting: Family

## 2024-03-10 DIAGNOSIS — I69351 Hemiplegia and hemiparesis following cerebral infarction affecting right dominant side: Secondary | ICD-10-CM | POA: Diagnosis not present

## 2024-03-10 NOTE — Telephone Encounter (Signed)
 Noted. Thanks.

## 2024-03-10 NOTE — Telephone Encounter (Signed)
 I can follow once she is back at home and out of rehab, ie once she is followed here at the clinic again.  Thanks.

## 2024-03-10 NOTE — Transitions of Care (Post Inpatient/ED Visit) (Signed)
 03/10/2024  Name: Catherine Green MRN: [id number] DOB: 06-16-1941  Today's TOC FU Call Status: Today's TOC FU Call Status:: Successful TOC FU Call Completed TOC FU Call Complete Date: 03/10/24 Patient's Name and Date of Birth confirmed.  Transition Care Management Follow-up Telephone Call Date of Discharge: 03/09/24 Discharge Facility: Other [facility]) Name of Other (Non[facility]) Discharge Facility: [facility] Type of Discharge: Inpatient Admission Primary Inpatient Discharge Diagnosis:: cerebral infarction How have you been since you were released from the hospital?: Same Any questions or concerns?: No  Items Reviewed: Did you receive and understand the discharge instructions provided?: Yes Medications obtained,verified, and reconciled?: Yes (Medications Reviewed) Any new allergies since your discharge?: No Dietary orders reviewed?: Yes Do you have support at home?: Yes People in Home [RPT]: spouse  Medications Reviewed Today: Medications Reviewed Today     Reviewed by Emmitt Pan, LPN (Licensed Practical Nurse) on 03/10/24 at 1142  Med List Status: <None>   Medication Order Taking? Sig Documenting Provider Last Dose Status Informant  albuterol  (VENTOLIN  HFA) 108 (90 Base) MCG/ACT inhaler [id number] Yes Inhale 2 puffs into the lungs every 6 (six) hours as needed for wheezing or shortness of breath. Cleatus Arlyss GORMAN, MD  Active Family Member  amLODipine  (NORVASC ) 2.5 MG tablet [id number] Yes Take 1 tablet (2.5 mg total) by mouth in the morning. Gollan, Timothy J, MD  Active Family Member  APIXABAN  (ELIQUIS ) VTE STARTER PACK (10MG  AND 5MG ) [id number] Yes Take as directed on package: start with two-5mg  tablets twice daily for 7 days. On day 8, switch to one-5mg  tablet twice daily. Lue Elsie BROCKS, MD  Active   BREO ELLIPTA  100-25 MCG/ACT AEPB [id number] Yes Inhale 1 puff into the lungs daily. [provider]  Active Family Member           Med Note Catherine,  NATHANEL Green Kitchens Feb 07, 2024  3:52 PM) Looks to have possibly not been filled since 10/2023 (??)  Coenzyme Q10 (CO Q10) 100 MG CAPS [id number] Yes Take 100 mg by mouth daily. [provider]  Active Family Member  empagliflozin  (JARDIANCE ) 10 MG TABS tablet [id number] Yes Take 1 tablet (10 mg total) by mouth daily before breakfast. Donette Ellouise LABOR, FNP  Active Family Member  ezetimibe  (ZETIA ) 10 MG tablet [id number] Yes Take 1 tablet (10 mg total) by mouth daily. Gollan, Timothy J, MD  Active Family Member  famotidine  (PEPCID ) 20 MG tablet [id number] Yes Take 1 tablet (20 mg total) by mouth daily. Lue Elsie BROCKS, MD  Active   food thickener (SIMPLYTHICK, NECTAR/LEVEL 2/MILDLY THICK,) GEL [id number] Yes Take 1 packet by mouth as needed. Lue Elsie BROCKS, MD  Active   gabapentin  (NEURONTIN ) 400 MG capsule [id number] Yes Take 1 capsule (400 mg total) by mouth 3 (three) times daily. Maree Hue, MD  Active Family Member  hydrALAZINE  (APRESOLINE ) 25 MG tablet [id number] Yes Take 1 tablet (25 mg total) by mouth every 6 (six) hours. Lue Elsie BROCKS, MD  Active   ipratropium (ATROVENT) 0.03 % nasal spray [id number] Yes Place 2 sprays into both nostrils 2 (two) times daily as needed for rhinitis. [provider]  Active Family Member  loratadine  (CLARITIN ) 10 MG tablet [id number] Yes Take 10 mg by mouth daily. [provider]  Active Family Member  Multiple Vitamin (MULTIVITAMIN WITH MINERALS) TABS tablet [id number] Yes Place 1 tablet into feeding tube daily. Lue Elsie BROCKS, MD  Active   Nutritional Supplements (FEEDING SUPPLEMENT, JEVITY 1.2 CAL,) LIQD [id number]  Yes Place 1,000 mLs into feeding tube continuous. Lue Elsie BROCKS, MD  Active   polyethylene glycol powder (GLYCOLAX /MIRALAX ) 17 GM/SCOOP powder [id number] Yes Take 17 g by mouth 2 (two) times daily as needed. Cleatus Arlyss RAMAN, MD  Active   rosuvastatin  (CRESTOR ) 40 MG tablet [id number] Yes Take 1 tablet (40 mg  total) by mouth daily. Lue Elsie BROCKS, MD  Active   sertraline  (ZOLOFT ) 50 MG tablet [id number] Yes Take 3 tablets (150 mg total) by mouth daily. Lue Elsie BROCKS, MD  Active   vitamin B-12 (CYANOCOBALAMIN ) 1000 MCG tablet [id number] Yes Take 1 tablet (1,000 mcg total) by mouth daily. Cleatus Arlyss RAMAN, MD  Active Family Member            Home Care and Equipment/Supplies: Were Home Health Services Ordered?: Yes Name of Home Health Agency:: [facility] Has Agency set up a time to come to your home?: Yes First Home Health Visit Date: 03/10/24 Any new equipment or medical supplies ordered?: Yes Name of Medical supply agency?: unknown Were you able to get the equipment/medical supplies?: Yes Do you have any questions related to the use of the equipment/supplies?: No  Functional Questionnaire: Do you need assistance with bathing/showering or dressing?: Yes Do you need assistance with meal preparation?: Yes Do you need assistance with eating?: Yes Do you have difficulty maintaining continence: Yes Do you need assistance with getting out of bed/getting out of a chair/moving?: Yes Do you have difficulty managing or taking your medications?: Yes  Follow up appointments reviewed: PCP Follow-up appointment confirmed?: Yes Date of PCP follow-up appointment?: 03/16/24 Follow-up Provider: [facility] Follow-up appointment confirmed?: NA Do you need transportation to your follow-up appointment?: No Do you understand care options if your condition(s) worsen?: Yes-patient verbalized understanding    SIGNATURE Julian Lemmings, LPN [facility] Nurse Health Advisor Direct Dial [phone]

## 2024-03-10 NOTE — Telephone Encounter (Signed)
 Left voicemail for Darice to return call to office.

## 2024-03-10 NOTE — Telephone Encounter (Signed)
 Spoke with Darice and advised of Dr. Cleatus notes

## 2024-03-13 ENCOUNTER — Telehealth: Payer: Self-pay

## 2024-03-13 ENCOUNTER — Telehealth: Payer: Self-pay | Admitting: Family Medicine

## 2024-03-13 DIAGNOSIS — R1310 Dysphagia, unspecified: Secondary | ICD-10-CM

## 2024-03-13 NOTE — Telephone Encounter (Signed)
 Copied from CRM [id number]. Topic: General - Other >> Mar 13, 2024  2:06 PM Jayma L wrote: Reason for CRM: patients husband lynwood called asking if we can send the completed paperwork to email [email or web address]

## 2024-03-13 NOTE — Telephone Encounter (Signed)
 Placed in your tray

## 2024-03-13 NOTE — Telephone Encounter (Signed)
 Received message from Mylinda from [place] [phone] Patient is non-verbal at this time. Per the discharge paperwork she is suppose to be on gtube but the spouse says that she is on puree(nectar/level 2 ). They are wondering if an order needs to be placed for the gtube jevity 1.2 cal 60ml an hour for 12 hours. 6pm-6am

## 2024-03-13 NOTE — Telephone Encounter (Signed)
 I can't fully address this question without the diet recommendations from SNF.  I need her most recent diet recommendations.  Please see about getting that.   To be as safe as possible, I would use the G tube in the meantime.   Rec: jevity 1.2 cal 60ml an hour for 12 hours. 6pm-6am   Would use dysphagia 2 with thick liquid for oral intake, at least until we can get report re: oral status from SNF.   Thanks.

## 2024-03-13 NOTE — Telephone Encounter (Signed)
 When opening the office up this morning, there was a letter outside the main doors for pt. Pt's husband, Lynwood, wrote a letter for Dr. Cleatus sttaing she suffered a stroke on 02/07/24. Lynwood requested if Dr. Cleatus, hasn't already, he review over pt records for more info on recent stroke? Lynwood also attached a med certificate, from the [employer], for Dr. Cleatus to comp. Lynwood states the pt is in desperate need for [facility] care. Lynwood requested a call back @ [phone] if there's any questions or concerns. Lynwood also requested a call back once form is comp. Letter was labeled as Urgent.

## 2024-03-14 ENCOUNTER — Telehealth: Payer: Self-pay | Admitting: Family Medicine

## 2024-03-14 ENCOUNTER — Other Ambulatory Visit: Payer: Self-pay | Admitting: Cardiovascular Disease

## 2024-03-14 DIAGNOSIS — R1310 Dysphagia, unspecified: Secondary | ICD-10-CM | POA: Insufficient documentation

## 2024-03-14 NOTE — Telephone Encounter (Signed)
 Spoke with patients spouse Lynwood and he will have daughter Wilbert Kitty pick up form

## 2024-03-14 NOTE — Telephone Encounter (Signed)
 Yes, please check with [employer] and case manager ASAP to get clarity on this.  Thanks.

## 2024-03-14 NOTE — Telephone Encounter (Signed)
 I spoke with Catherine Green today about the gtube and to give the orders. Well although the patient has a gtube she was not sent home with a pump or anything. I am wondering do I need to send this to the case manager so that they can contact [employer] and work on this. In the meantime the spouse is doing puree and baby food for the patient. They are still scheduled to see you on 7/17

## 2024-03-14 NOTE — Telephone Encounter (Signed)
Form done. Thanks. Please scan and send.  

## 2024-03-14 NOTE — Telephone Encounter (Signed)
 Patient's daughter Randine, came in and picked up form.

## 2024-03-16 ENCOUNTER — Telehealth: Payer: Self-pay | Admitting: Family Medicine

## 2024-03-16 ENCOUNTER — Encounter: Payer: Self-pay | Admitting: Family Medicine

## 2024-03-16 ENCOUNTER — Ambulatory Visit (INDEPENDENT_AMBULATORY_CARE_PROVIDER_SITE_OTHER): Admitting: Family Medicine

## 2024-03-16 VITALS — BP 132/62 | HR 77 | Ht 62.0 in | Wt 123.6 lb

## 2024-03-16 DIAGNOSIS — I10 Essential (primary) hypertension: Secondary | ICD-10-CM | POA: Diagnosis not present

## 2024-03-16 DIAGNOSIS — F32A Depression, unspecified: Secondary | ICD-10-CM

## 2024-03-16 DIAGNOSIS — I4891 Unspecified atrial fibrillation: Secondary | ICD-10-CM

## 2024-03-16 DIAGNOSIS — I639 Cerebral infarction, unspecified: Secondary | ICD-10-CM | POA: Diagnosis not present

## 2024-03-16 DIAGNOSIS — R1310 Dysphagia, unspecified: Secondary | ICD-10-CM

## 2024-03-16 DIAGNOSIS — Z8673 Personal history of transient ischemic attack (TIA), and cerebral infarction without residual deficits: Secondary | ICD-10-CM

## 2024-03-16 DIAGNOSIS — F419 Anxiety disorder, unspecified: Secondary | ICD-10-CM | POA: Diagnosis not present

## 2024-03-16 DIAGNOSIS — E785 Hyperlipidemia, unspecified: Secondary | ICD-10-CM | POA: Diagnosis not present

## 2024-03-16 MED ORDER — VITAMIN B-12 1000 MCG PO TABS: 1000.0000 ug | ORAL_TABLET | Freq: Every day | ORAL | Status: DC

## 2024-03-16 MED ORDER — EZETIMIBE 10 MG PO TABS: 10.0000 mg | ORAL_TABLET | Freq: Every day | ORAL | Status: DC

## 2024-03-16 MED ORDER — SERTRALINE HCL 50 MG PO TABS: 150.0000 mg | ORAL_TABLET | Freq: Every day | ORAL | Status: DC

## 2024-03-16 MED ORDER — AMLODIPINE BESYLATE 2.5 MG PO TABS: 2.5000 mg | ORAL_TABLET | Freq: Every morning | ORAL | Status: DC

## 2024-03-16 MED ORDER — EMPAGLIFLOZIN 10 MG PO TABS: ORAL_TABLET | ORAL | Status: DC

## 2024-03-16 MED ORDER — ROSUVASTATIN CALCIUM 40 MG PO TABS: 40.0000 mg | ORAL_TABLET | Freq: Every day | ORAL | Status: DC

## 2024-03-16 MED ORDER — FOOD THICKENER (SIMPLYTHICK): 1.0000 | ORAL | 3 refills | Status: DC | PRN

## 2024-03-16 MED ORDER — FAMOTIDINE 20 MG PO TABS: 20.0000 mg | ORAL_TABLET | Freq: Every day | ORAL | Status: DC

## 2024-03-16 MED ORDER — HYDRALAZINE HCL 25 MG PO TABS: 25.0000 mg | ORAL_TABLET | Freq: Four times a day (QID) | ORAL | Status: DC

## 2024-03-16 MED ORDER — POLYETHYLENE GLYCOL 3350 17 GM/SCOOP PO POWD: 17.0000 g | Freq: Two times a day (BID) | ORAL | Status: DC | PRN

## 2024-03-16 MED ORDER — ADULT MULTIVITAMIN W/MINERALS CH: 1.0000 | ORAL_TABLET | Freq: Every day | ORAL | Status: DC

## 2024-03-16 NOTE — Patient Instructions (Addendum)
 Please send any papers you have from [employer].    Labs today.  Weight check today.  You should get a call from pharmacy staff about eliquis .  Take care.  Glad to see you.

## 2024-03-16 NOTE — Assessment & Plan Note (Signed)
 Multiple issues to address.  See following phone note. Daughter was able to come back with list of medications and orders from facility.  Med list updated to the best of my ability after the office visit.  I am checking with staff to make sure home health is providing equipment so medications can be delivered via G-tube.  At this point it does not appear that she needs to be on tube feeds but we will need to monitor her weight.  It is reasonable to avoid thin liquids and use thickener for nectar thick liquids.  Requesting pharmacy help regarding Eliquis  affordability.  We will check on neurology follow-up. 50 minutes were devoted to patient care in this encounter (this includes time spent reviewing the patient's file/history, interviewing and examining the patient, counseling/reviewing plan with patient).

## 2024-03-16 NOTE — Progress Notes (Signed)
 F/u from CVA and rehab stay, now at home.    ============ Inpatient course reviewed.  Large acute MCA territory infarct/CVA  - Patient presents with right-sided weakness R more than leg, expressive aphasia, right-sided hemineglect.  - CT L MCA infarct with left MCA hyperdense sign - CTA head and neck left ICA occluded at siphon, as well as left MCA and ACA occlusion - 2D echo EF 60 to 65% with grade 3 diastolic dysfunction. - Dopplers negative for DVT - Continue Eliquis  given newly diagnosed a-fib (no indication for antiplatelets at this time)   Dysphagia - MBS 6/11, currently on dysphagia 1 with nectar thick, advance as tolerated. - Remains with significant aphasia. - She is on dysphagia 1 with nectar thick, had another MBS study yesterday, does appear to be having silent aspirations on this diet, so she was downgraded to dysphagia 1 with thick liquid. - Discussed with husband, patient is regressing in her diet, and will need to proceed with PEG to ensure enough oral intake and low enough time for diet advancement, agreeable, IR consulted for PEG placement. - PEG successfully placed am 02/17/2024 -okay to use, tolerating feeds today   Atrial arrhythmias A-fib/flutter -Cardiology following, appreciate insight recommendations, no need for long-term heart monitor, continue Eliquis  at discharge   Hypertension: Permissive hypertension completed - Continue amlodipine , hydralazine  -Metoprolol  discontinued in setting of bradycardia   Hyperlipidemia  - Continue rosuvastatin  and Zetia  as tolerated   Carotid artery disease - prior CABG    - Plavix/Aspirin  has been discontinued as she is currently on Eliquis  . - Will DC Coreg  given bradycardial.   Chronic diastolic CHF  > Last echo was in March with EF 60-65%, indeterminate diastolic function, normal RV function. Holding Lasix  as above   Anxiety - Depression  - Continue sertraline  if able Neuropathy  - Continue gabapentin  as  able Hyperkalemia - resolved COPD   - Continue home Breo and albuterol     ============  We haven't been able to get any records from [employer] in spite of record request.  Daughter agreed to go home to get any papers that she had from [employer] and bring them back to the clinic.  She needs help with paying for eliquis .  She had been taking it daily, not BID.  Discussed dosing.  Pharmacy referral placed.  She has G Tube in place.  She needs a new adapter for G Tube, I am checking with staff about that.  She had been off tube feeds for the last week.  She doesn't have equipment for that at home.  Per family report she was not discharged with tube feeds.  She has some cough with taking thin liquids.  Weight check was done today by weighing the patient with her wheelchair and then subtracting the weight of the wheelchair when it was measured separately.  Meds, vitals, and allergies reviewed.   ROS: Per HPI unless specifically indicated in ROS section   Not in respiratory distress, sitting in wheelchair. R facial droop noted. Averbal. MMM Neck supple, no LA Rrr Ctab Abd soft, G-tube in place, normal appearing. Diffuse right sided weakness, upper and lower extremity. Skin well-perfused.

## 2024-03-16 NOTE — Telephone Encounter (Signed)
 Patient came in and dropped off her mother's copy of documents for Catherine Green, the documents are in his folder upfront.

## 2024-03-16 NOTE — Telephone Encounter (Signed)
 There are multiple issues to address.  Please make sure home health is providing equipment so medications can be delivered via G-tube.  I would not take medications by mouth.   At this point it does not appear that she needs to be on tube feeds but they will need to monitor her weight at home.  I realize she may not be able to weigh easily.  They may need to use other measurements like waist circumference periodically.  See if home health is able to weigh the patient.   I would not take thin liquids for now by mouth.  I would use thickener for nectar thick liquids.  I could not get the thickener prescription to transmit via EMR.  I printed it.  Please either call that in using the printed prescription or pull it for me to sign and then fax over.  Please update family to use that to slightly thicken any liquids by mouth.  Please verify and send a copy of the updated med list to the patient's husband and daughter.  That is the med list that is updated from the facility list.  Some of the medications are as needed, such as MiraLAX .  They would only give MiraLAX  via G-tube without thickener if she had significant constipation.  Please send a note to the referral department to ask for her neurology appointment to get moved up to soon as possible.  Thanks.

## 2024-03-17 ENCOUNTER — Other Ambulatory Visit: Payer: Self-pay | Admitting: Family Medicine

## 2024-03-17 ENCOUNTER — Telehealth: Payer: Self-pay | Admitting: Family Medicine

## 2024-03-17 ENCOUNTER — Other Ambulatory Visit: Payer: Self-pay

## 2024-03-17 ENCOUNTER — Ambulatory Visit: Payer: Self-pay | Admitting: Family Medicine

## 2024-03-17 LAB — COMPREHENSIVE METABOLIC PANEL WITH GFR
ALT: 39 U/L — ABNORMAL HIGH (ref 0–35)
AST: 51 U/L — ABNORMAL HIGH (ref 0–37)
Albumin: 4.5 g/dL (ref 3.5–5.2)
Alkaline Phosphatase: 99 U/L (ref 39–117)
BUN: 27 mg/dL — ABNORMAL HIGH (ref 6–23)
CO2: 33 meq/L — ABNORMAL HIGH (ref 19–32)
Calcium: 10.2 mg/dL (ref 8.4–10.5)
Chloride: 97 meq/L (ref 96–112)
Creatinine, Ser: 0.8 mg/dL (ref 0.40–1.20)
GFR: 67.85 mL/min (ref >60.00)
Glucose, Bld: 127 mg/dL — ABNORMAL HIGH (ref 70–99)
Potassium: 4.5 meq/L (ref 3.5–5.1)
Sodium: 143 meq/L (ref 135–145)
Total Bilirubin: 0.6 mg/dL (ref 0.2–1.2)
Total Protein: 7.8 g/dL (ref 6.0–8.3)

## 2024-03-17 LAB — CBC WITH DIFFERENTIAL/PLATELET
Basophils Absolute: 0.1 K/uL (ref 0.0–0.1)
Basophils Relative: 0.5 % (ref 0.0–3.0)
Eosinophils Absolute: 0 K/uL (ref 0.0–0.7)
Eosinophils Relative: 0.5 % (ref 0.0–5.0)
HCT: 43.4 % (ref 36.0–46.0)
Hemoglobin: 13.9 g/dL (ref 12.0–15.0)
Lymphocytes Relative: 8.6 % — ABNORMAL LOW (ref 12.0–46.0)
Lymphs Abs: 0.8 K/uL (ref 0.7–4.0)
MCHC: 32.1 g/dL (ref 30.0–36.0)
MCV: 83.5 fl (ref 78.0–100.0)
Monocytes Absolute: 0.7 K/uL (ref 0.1–1.0)
Monocytes Relative: 6.8 % (ref 3.0–12.0)
Neutro Abs: 8 K/uL — ABNORMAL HIGH (ref 1.4–7.7)
Neutrophils Relative %: 83.6 % — ABNORMAL HIGH (ref 43.0–77.0)
Platelets: 391 K/uL (ref 150.0–400.0)
RBC: 5.2 Mil/uL — ABNORMAL HIGH (ref 3.87–5.11)
RDW: 14.9 % (ref 11.5–15.5)
WBC: 9.5 K/uL (ref 4.0–10.5)

## 2024-03-17 MED ORDER — THICK-IT PO POWD: ORAL | Status: DC

## 2024-03-17 MED ORDER — THICK-IT PO POWD: ORAL | 1 refills | Status: DC

## 2024-03-17 NOTE — Telephone Encounter (Signed)
 Received

## 2024-03-17 NOTE — Telephone Encounter (Signed)
 I put in the neuro referral and sent the rx for thick-it.  Thanks.

## 2024-03-17 NOTE — Telephone Encounter (Signed)
 Spoke with Catherine Green with [facility] and they have the appropriate equipment so medication can be delivered via G-tube. As for the weight aside from them weight at hime she will make sure that the nurse does weekly weights.  I spoke with the pharmacy and they only carry Thick-It. Is this okay? If so please send prescription.

## 2024-03-17 NOTE — Telephone Encounter (Signed)
 Reached out to patients spouse Lynwood who is on the [facility] Randine is not. Advise that he can try calling the mychart team at [phone] to get assistance with proxy access. Unfortunately there is not anything that I can do.

## 2024-03-17 NOTE — Telephone Encounter (Signed)
 Not showing an active neurology referral

## 2024-03-17 NOTE — Telephone Encounter (Signed)
 Copied from CRM [id number]. Topic: MyChart - Proxy Access >> Mar 17, 2024  8:53 AM Berneda FALCON wrote: Son Leone) is calling to see what needs to be done in order for father Elisheba Mcdonnell) to gain diminished support proxy access for this patient so that father has access to her MyChart. She has dementia.   Son [phone]

## 2024-03-20 ENCOUNTER — Telehealth: Payer: Self-pay

## 2024-03-20 NOTE — Progress Notes (Signed)
 Complex Care Management Note  Care Guide Note 03/20/2024 Name: Catherine Green MRN: [id number] DOB: August 04, 1941  [name] is a 83 y.o. year old female who sees Cleatus Arlyss GORMAN, MD for primary care. I reached out to [name] by phone today to offer complex care management services.  Ms. Pillars was given information about Complex Care Management services today including:   The Complex Care Management services include support from the care team which includes your Nurse Care Manager, Clinical Social Worker, or Pharmacist.  The Complex Care Management team is here to help remove barriers to the health concerns and goals most important to you. Complex Care Management services are voluntary, and the patient may decline or stop services at any time by request to their care team member.   Complex Care Management Consent Status: Patient agreed to services and verbal consent obtained.   Follow up plan:  Telephone appointment with complex care management team member scheduled for:  03/23/24 at 9:00 a.m.   Encounter Outcome:  Patient Scheduled  Dreama [facility]wood [facility]  [facility], [facility] Health Care Management Assistant Direct Dial: [phone]  Fax: [phone]

## 2024-03-21 ENCOUNTER — Telehealth: Payer: Self-pay | Admitting: Family Medicine

## 2024-03-21 ENCOUNTER — Telehealth: Payer: Self-pay

## 2024-03-21 NOTE — Telephone Encounter (Signed)
 Pt's daughter came in and dropped in form, the form is in Dr. Elfredia folder up front.

## 2024-03-21 NOTE — Telephone Encounter (Signed)
 Copied from CRM [id number]. Topic: Clinical - Medical Advice >> Mar 21, 2024  3:11 PM Shereese L wrote: Reason for CRM: husband is calling in for access to my-chart. As per my chart help desk he would need to get a Diminish capacity proxy. Contacted the office and was advised that he would have to come in to sign those papers. He stated he'll be in the office tomorrow to sign and will be bringing more papers that the insurance is requesting

## 2024-03-21 NOTE — Telephone Encounter (Signed)
 Placed in providers tray

## 2024-03-21 NOTE — Telephone Encounter (Signed)
 Returned call to patient spouse Catherine Green. He will come in tomorrow and fill out the paperwork for the release of medical records   Copied from CRM [id number]. Topic: Medical Record Request - Records Request >> Mar 21, 2024 12:00 PM [place] wrote: Reason for CRM: Patient Husband and [name] from [facility] calling- asking to get wifes medical records. Please reach out to patient husband [phone]   when this would be available for her to pick up.

## 2024-03-22 ENCOUNTER — Telehealth: Payer: Self-pay | Admitting: Family Medicine

## 2024-03-22 ENCOUNTER — Telehealth: Payer: Self-pay

## 2024-03-23 ENCOUNTER — Other Ambulatory Visit

## 2024-03-23 ENCOUNTER — Telehealth: Payer: Self-pay

## 2024-03-23 ENCOUNTER — Telehealth: Payer: Self-pay | Admitting: Family Medicine

## 2024-03-23 NOTE — Progress Notes (Signed)
 Complex Care Management Note Care Guide Note  03/23/2024 Name: Catherine Green MRN: [id number] DOB: 09-30-1940   Complex Care Management Outreach Attempts: An unsuccessful telephone outreach was attempted today to offer the patient information about available complex care management services.  Follow Up Plan:  Additional outreach attempts will be made to offer the patient complex care management information and services.   Encounter Outcome:  No Answer  Leotis Rase [facility], [facility] Guide  Direct Dial: [phone]  Fax [phone]

## 2024-03-23 NOTE — Progress Notes (Signed)
 Care Guide Pharmacy Note  04/12/24 Name: Catherine Green MRN: [id number] DOB: 08-Feb-1941  Referred By: Cleatus Arlyss GORMAN, MD Reason for referral: Complex Care Management and Call Attempt #1 (Unsuccessful Initial outreach to schedule with PHARM D- Manuelita)   Catherine Green is a 83 y.o. year old female who is a primary care patient of Cleatus Arlyss GORMAN, MD.  [name] was referred to the pharmacist for assistance related to: Medication assistance. Pt's spouse informed me that patient had passed away on 04/12/24.   No further contact will be made.   Leotis Rase [facility], [facility] Guide  Direct Dial: [phone]  Fax [phone]

## 2024-03-23 NOTE — Telephone Encounter (Signed)
 Copied from CRM [id number]. Topic: General - Other >> Mar 23, 2024  2:30 PM Viola F wrote: Reason for CRM: Bett from [facility] called regarding a death certificate that was sent yesterday digitally to Dr. Cleatus to sign. Patient is being cremated and family needs done as soon as possible.. The daves case number is [id number]. Please call Bett with an update at [phone]

## 2024-03-23 NOTE — Telephone Encounter (Signed)
 Left message for officer to return call

## 2024-03-24 ENCOUNTER — Telehealth: Payer: Self-pay | Admitting: Neurology

## 2024-03-24 NOTE — Telephone Encounter (Signed)
 Spoke to [employer] [facility], they asked for an update on [name] signing pt's certificate online? Let funeral home know that Catherine Green was out of office until 7/28 & that Dr. KANDICE, whose taking over his urgent messages, could possibly sign off. Spoke to Dr. KANDICE, he stated he would sign certificate. Funeral home provided the [facility] dave case # [id number] for signing. Funeral home states pt was being cremated & needed certificate signed asap, to proceed with pt's service. Call back # [phone]

## 2024-03-24 NOTE — Telephone Encounter (Signed)
 Pt passed away 03/11/2024.   Chart reviewed. Recent large acute MCA territory infarct with residual neurological deficits.  New diagnosis afib.  Spoke with husband, expressed my condolences.   Death certificate filled out online  Spoke with Bett from [employer] funeral to let her know I'm filling this out now.

## 2024-03-24 NOTE — Telephone Encounter (Signed)
Forward to PCP as Juluis Rainier

## 2024-03-24 NOTE — Telephone Encounter (Signed)
 Closing encounter. Issues is being addressed. Funeral home has also reached out to the office and Dr. KANDICE is going to sign the death certificate in the absence of Dr. Cleatus

## 2024-03-24 NOTE — Telephone Encounter (Signed)
 Closing encounter. Issue is addresses in another encounter

## 2024-03-24 NOTE — Telephone Encounter (Signed)
 Following up to see if this has been sent. Thank you

## 2024-03-24 NOTE — Telephone Encounter (Signed)
 Pt husband called to let Dr.Hill know the patient has passed away

## 2024-03-26 NOTE — Telephone Encounter (Signed)
 Noted. Thanks.   I called Mr. Dascenzo and gave my condolences.  He thanked me for the call.  I was glad to see this kind lady in clinic.  She will be missed.  Please update the chart.

## 2024-03-26 NOTE — Telephone Encounter (Signed)
 Noted. Thanks.

## 2024-03-30 ENCOUNTER — Telehealth: Admitting: Neurology

## 2024-03-31 NOTE — Telephone Encounter (Signed)
 Received a call from a [id number] stating a Medical Examiner was calling on the pt's behalf. Examiner stated pt recently passed away. Date wasn't provided, rather if it was yesterday, 7/23 or today, 7/24. Examiner states the funeral home & the city needed to know if Dr. Cleatus would sign the pt's death certificate before the pt could be taken to the funeral home. Let examiner know that Dr. Cleatus was out of the office until Mon, 7/28, but anything needed requiring his signature or attention, the office will let him know. Examiner had no further questions/concerns.

## 2024-03-31 NOTE — Telephone Encounter (Signed)
 FYI

## 2024-03-31 NOTE — Progress Notes (Deleted)
 Virtual Visit Via Video    Consent was obtained for video visit:  {yes no:314532} Answered questions that patient had about telehealth interaction:  {yes no:314532} I discussed the limitations, risks, security and privacy concerns of performing an evaluation and management service by telemedicine. I also discussed with the patient that there may be a patient responsible charge related to this service. The patient expressed understanding and agreed to proceed.  Pt location: Home Physician Location: office Name of referring provider:  Cleatus Arlyss RAMAN, MD I connected with Corky RAMAN Fish at patients initiation/request on 03/30/2024 at  1:00 PM EDT by video enabled telemedicine application and verified that I am speaking with the correct person using two identifiers. Pt MRN:  [id number] Pt DOB:  05-Sep-1940 Video Participants:  Corky RAMAN Fish;  ***   Subjective:  TARRIE MCMICHEN is a 83 y.o. year old right-handed female with a history of recent stroke (***), lumbar spine disease s/p surgery (~2018), HTN, HLD, CAD s/p CABG (2004), carotid stenosis s/p surgery on both sides multiple times, CHF, afib, depression, PVD, edema (on lasix ), previous smoker who we last saw on 01/28/24 for weakness and gait instability.  To briefly review: 07/30/22: Patient has had weakness off and on for 2-3 years. The weakness is in her legs. It is especially bad when it is cold. She gets pain in her back, front of her legs, and down below knee. It is an electric pain. She can have leg soreness the day after bad electric pains. It has been getting worse. She states this has been going on for a while (not sure exactly when). She finds when she walks, things are worse. When she sits down, symptoms improve. When she is in a store walking, hanging on a cart helps her move better. She will use a walker in her condo.   Patient had one fall, on 07/07/22 when she fell at the dentist. She has almost fallen other times but grabs onto  her husband.   She has a history of lumbar spine disease. She had surgery in ~2018 and has been told everything has been done that can be done. She previously got injections as well.   She takes gabapentin  400 mg every 8 hours for pain. This helps but causes some cognitive slowing for her. She takes B12 1000 mcg daily. She was previously on crestor  20 mg and zetia  10 mg daily for many years. She recently stopped the crestor  but is still on the zetia  due to concerns for weakness on 07/17/22. This has not changed her symptoms.    Patient denies diplopia, ptosis, problems chewing, changes to her voice. She occasionally gets choked when eating bread or something she has to chew a lot. She occasionally coughs when drinking water. She has had this looked at with endoscopy years ago and a polyp was removed. She does not think this helped.   There are no neuromuscular respiratory weakness symptoms, particularly orthopnea>dyspnea.    Pseudobulbar affect is absent.   She endorses dry eyes and mouth. She denies bowel or bladder problems, early satiety, or postprandial bloating.   She has lost 30 pounds over the last year but stable over the last year. Patient feels this was due to taking lasix  and loss of edema.   EtOH use: None  Restrictive diet? No Family history of neuropathy/myopathy/NM disease? No   Patient had an EMG in 2019, prior to symptoms in her legs (normal EMG).   11/12/22: Labs were normal. EMG  on 09/28/22 was normal with no evidence of neuropathy, myopathy, or left cervical or lumbosacral radiculopathy.   Patient's symptoms improved with therapy. She finished therapy recently and feels like she has returned to previously low. She has home exercises. She states she was keeping up with the exercises until she got sick about 3 weeks. She has a sinus infection and has felt dizzy and hearing worse than normal.   She has had no falls. She uses a walker at home.   02/10/23: Labs were normal.  MRI cervical spine confirmed spinal stenosis, so patient was referred to spine surgery. Patient had C5-C7 fusion on 01/11/23.    She mentions that she is still weak. She is having difficulty swallowing, which she states is new from surgery. It started a couple of weeks after surgery. Her voice is weaker as well.   She thinks overall she is walking better. She has not had any falls. Her legs have not given out on her. She denies any neck pain or numbness or tingling in arms or legs. She denies orthopnea. She denies muscle twitching.  01/28/24: Patient is not able to stand or walk. She has a lot of pain in her legs. It is throughout both legs. It feels like electric shocks. It feels like it is coming from her low back. If she stands and walks her legs get weak. When her legs are bothering her, she can lose her bowel or bladder. When she sits she gets some relief. She denies saddle anesthesia.   She has fallen multiple times.   At least visit, patient mentioned difficulty swallowing after cervical spine surgery. This improved.   She also mentions headaches, perhaps due to more reading, but this is not a pressing concern. It may also have been due to allergies.  Most recent Assessment and Plan (01/28/24): This is Saida S Sobczyk, a 83 y.o. female with difficulty ambulating due to back pain radiating into legs. Symptoms will improve if she sits. She has had multiple falls. She has also had episodes of bowel and bladder incontinence with her symptoms. This is most concerning from lumbar stenosis with neurogenic claudication, perhaps also with pressure on conus or cauda equina. I will get MRI of lumbar spine stat and get her back with her spine surgeon.    Plan: -MRI lumbar spine wo contrast - will order STAT -Recommend patient see her spine surgeon, Dr. Malcolm, will send my notes -Discussed reasons to go to ED including inability to move legs, hold bowel or bladder, or saddle anesthesia  Since their last  visit: MRI lumbar spine did show concern for multilevel stenosis. I recommended she speak her spine surgeon as previously discussed. ***  Patient's husband heard her fall on 02/07/24 while folding laundry around 9 am. Later he noted she had not been speaking much and not moving her right side, so he called EMS around 1:30 pm. CTH showed acute left MCA infarct. CTA head and neck showed occlusion at LICA, left A1, and left M1. Family declined thrombectomy.***  MEDICATIONS:  Outpatient Encounter Medications as of 03/30/2024  Medication Sig   albuterol  (VENTOLIN  HFA) 108 (90 Base) MCG/ACT inhaler Inhale 2 puffs into the lungs every 6 (six) hours as needed for wheezing or shortness of breath.   amLODipine  (NORVASC ) 2.5 MG tablet Place 1 tablet (2.5 mg total) into feeding tube in the morning.   apixaban  (ELIQUIS ) 5 MG TABS tablet Place 5 mg into feeding tube 2 (two) times daily.   BREO  ELLIPTA 100-25 MCG/ACT AEPB Inhale 1 puff into the lungs daily.   Coenzyme Q10 (CO Q10) 100 MG CAPS Place 100 mg into feeding tube daily.   cyanocobalamin  (VITAMIN B12) 1000 MCG tablet Place 1 tablet (1,000 mcg total) into feeding tube daily.   empagliflozin  (JARDIANCE ) 10 MG TABS tablet 1 tablet per tube daily.   ezetimibe  (ZETIA ) 10 MG tablet Place 1 tablet (10 mg total) into feeding tube daily.   famotidine  (PEPCID ) 20 MG tablet Place 1 tablet (20 mg total) into feeding tube daily.   gabapentin  (NEURONTIN ) 250 MG/5ML solution Place 400 mg into feeding tube 3 (three) times daily.   hydrALAZINE  (APRESOLINE ) 25 MG tablet Place 1 tablet (25 mg total) into feeding tube every 6 (six) hours.   Multiple Vitamin (MULTIVITAMIN WITH MINERALS) TABS tablet Place 1 tablet into feeding tube daily.   polyethylene glycol powder (GLYCOLAX /MIRALAX ) 17 GM/SCOOP powder Place 17 g into feeding tube 2 (two) times daily as needed.   rosuvastatin  (CRESTOR ) 40 MG tablet Place 1 tablet (40 mg total) into feeding tube daily.   sertraline  (ZOLOFT )  50 MG tablet Place 3 tablets (150 mg total) into feeding tube daily.   STARCH-MALTO DEXTRIN (THICK-IT) POWD Use to thicken liquids to nectar thick consistency   No facility-administered encounter medications on file as of 03/30/2024.    PAST MEDICAL HISTORY: Past Medical History:  Diagnosis Date   Acute hypoxic respiratory failure (HCC) 11/09/2023   Acute non-recurrent maxillary sinusitis 10/28/2022   Allergy    hay fever   Anemia    Anxiety    Back pain    Carotid artery occlusion    Cerebrovascular disease    extracranial; occlusive   Chicken pox    Coronary artery disease    Depression    Dizziness    Dizziness    DVT (deep venous thrombosis) (HCC)    Fainting spell    GERD (gastroesophageal reflux disease)    Headache    Heart murmur    Hyperlipidemia    Hypertension    Leg pain    Mitral regurgitation    PONV (postoperative nausea and vomiting)    severe nausea and vomiting   Pre-syncope    PVD (peripheral vascular disease) (HCC)    endarterectomy by Dr. Oris   Seasonal allergies    Shortness of breath dyspnea    wth ambulation at times   Swelling of both ankles    and abdomen; takes Lasix  when needed   Thrombophlebitis    following childbirth   Ulcer     PAST SURGICAL HISTORY: Past Surgical History:  Procedure Laterality Date   ANTERIOR CERVICAL DECOMP/DISCECTOMY FUSION N/A 01/11/2023   Procedure: Anterior Cervical Decompression/Discectomy Fusion Cervical Five-Cervical Six - Cervical Six-Cervical Seven;  Surgeon: Louis Shove, MD;  Location: [facility] OR;  Service: Neurosurgery;  Laterality: N/A;  3C   APPENDECTOMY     BREAST SURGERY     saline implants   CARDIAC CATHETERIZATION     CAROTID ENDARTERECTOMY  1992   CAROTID ENDARTERECTOMY Right 02/13/2005   Re-do Right CE   CATARACT EXTRACTION, BILATERAL  2013   CHOLECYSTECTOMY, LAPAROSCOPIC  04/02/2014   Dr. Unknown Sharps   CORONARY ARTERY BYPASS GRAFT  2006   x3 Dr. Fleeta Ochoa   ENDARTERECTOMY Left  07/20/2014   Procedure: ENDARTERECTOMY CAROTID-LEFT;  Surgeon: Krystal JULIANNA Oris, MD;  Location: [facility] OR;  Service: Vascular;  Laterality: Left;   EYE SURGERY Bilateral 10/2011   Cataract Left eye   IR GASTROSTOMY  TUBE MOD SED  02/17/2024   SEPTOPLASTY     with bilateral inferior turbinate reductions   SPINE SURGERY  12/2015   TOTAL ABDOMINAL HYSTERECTOMY     UPPER GASTROINTESTINAL ENDOSCOPY     had polyps removed from esophagus   UPPER GI ENDOSCOPY      ALLERGIES: Allergies  Allergen Reactions   Lyrica  [Pregabalin ] Shortness Of Breath, Swelling and Other (See Comments)    Chest tightness, also    Hydrocodone  Other (See Comments)    Hallucinations    Ivp Dye [Iodinated Contrast Media] Hives and Itching        Lipitor [Atorvastatin] Nausea And Vomiting   Methylprednisolone  Other (See Comments)    Severe weekness    Oxycodone  Hcl Other (See Comments)    Hallucinations    Penicillin G Nausea And Vomiting   Shellfish Allergy Hives and Itching   Buspar  [Buspirone ] Other (See Comments)    Worsening mood   Clavulanic Acid Other (See Comments)    Reaction not noted   Prednisone  Other (See Comments)    Reaction not noted   Codeine Nausea And Vomiting and Other (See Comments)   Cymbalta  [Duloxetine  Hcl] Nausea Only    GI upset at 30mg    Demerol Nausea And Vomiting   Doxycycline  Nausea Only and Other (See Comments)    Weakness, sick to her stomach   Effexor  [Venlafaxine ] Nausea Only   Hydrochlorothiazide  Other (See Comments)    Not an allergy but urinary frequency and sweating at 25mg    Hydroxyzine  Other (See Comments)    Excessive sweating.    Metoprolol  Other (See Comments)    Slowed body down, per pt    Protonix  [Pantoprazole  Sodium] Nausea Only   Trazodone  And Nefazodone Other (See Comments)    Sedation     FAMILY HISTORY: Family History  Problem Relation Age of Onset   Aneurysm Mother        brain   Heart disease Mother        Aneyursm    Hyperlipidemia Mother     Hypertension Mother    Varicose Veins Mother    Bleeding Disorder Mother    Heart disease Father    Cirrhosis Father    Heart attack Father    Hyperlipidemia Father    Hypertension Father    Cancer Sister        lung   Heart disease Sister        Aneurysm   Hyperlipidemia Sister    Hypertension Sister    Varicose Veins Sister    Bleeding Disorder Sister    Heart disease Brother        Before age 35   Aneurysm Brother    Deep vein thrombosis Brother    Birth defects Brother    Hyperlipidemia Brother    Hypertension Brother    Peripheral vascular disease Daughter    Hyperlipidemia Daughter    Hypertension Daughter    Hypertension Other    Breast cancer Neg Hx    Colon cancer Neg Hx     SOCIAL HISTORY: Social History   Tobacco Use   Smoking status: Former    Current packs/day: 0.00    Average packs/day: 0.3 packs/day for 15.0 years (4.5 ttl pk-yrs)    Types: Cigarettes    Start date: 08/31/1981    Quit date: 08/31/1996    Years since quitting: 27.5    Passive exposure: Never   Smokeless tobacco: Never  Vaping Use   Vaping status: Never Used  Substance Use Topics   Alcohol use: No    Alcohol/week: 0.0 standard drinks of alcohol   Drug use: No   Social History   Social History Narrative   Lives with husband in a one level    Work - retired Producer, television/film/video   Diet - healthy   Right handed    Caffeine- 1 cup per day.      Had 1 daughter. Daughter is deceased      Objective:  Vital Signs:  There were no vitals taken for this visit.  ***  Labs and Imaging review: New results: ***  Previously reviewed results: BMP (12/10/23) unremarkable CBC (11/12/23) unremarkable TSH (09/20/23) wnl   11/11/21: Vit E wnl Copper  wnl   07/30/22: HMGCR ab: < 2 Vit E wnl Copper  wnl B1 wnl IFE: no M protein ANA/ENA wnl   07/17/22 labs: CK: 134 (409 four years ago) B12: 874 TSH: 1.02 ESR: 25 CBC and CMP wnl   HbA1c (02/07/22): 5.8   MRI lumbar spine wo contrast  (11/12/20): FINDINGS: Segmentation: Consistent with the previous exams, L5 is a transitional vertebra, largely sacralized.   Alignment:  Normal   Vertebrae:  No fracture or primary bone lesion.   Conus medullaris and cauda equina: Conus extends to the L1 level. Conus and cauda equina appear normal.   Paraspinal and other soft tissues: Negative. Insignificant small renal cysts.   Disc levels:   T12-L1 and L1-2: Normal.   L2-3: Mild bulging of the disc. Mild ligamentous prominence. Mild canal narrowing but no neural compression. No change since 2020.   L3-4: Mild bulging of the disc. Mild ligamentous prominence. Mild canal narrowing but no neural compression. No change since 2020.   L4-5: Previous partial in ectomy on the right. Moderate bulging of the disc. Mild facet hypertrophy. Mild narrowing of the lateral recesses but without visible neural compression. No change since 2020.   L5-S1: Transitional level.  Rudimentary normal disc.  No stenosis.   IMPRESSION: 1. Consistent with the previous exams, L5 is a transitional vertebra, largely sacralized. 2. No change since the study 05/25/2019. Mild chronic degenerative changes at L2-3, L3-4 and L4-5 with disc bulges and ligamentous prominence but no apparent neural compression. Findings could contribute to back pain. Previous right laminotomy L4-5.   MRI brain w/wo contrast (08/03/18): FINDINGS: Brain: IAC protocol was performed including thin section imaging through the posterior fossa before and after intravenous contrast. Seventh and 8th cranial nerves are normal. Negative for vestibular schwannoma. Basilar cisterns normal. Brainstem and cerebellum normal. Mastoid sinus is clear bilaterally. No enhancing mass in the posterior fossa or temporal bone.   Ventricle size and cerebral volume normal. Negative for acute infarct. Scattered white matter hyperintensities bilaterally compatible with chronic microvascular  ischemia.   Vascular: Normal arterial flow voids. Normal venous enhancement without thrombosis.   Skull and upper cervical spine: Negative   Sinuses/Orbits: Paranasal sinuses clear. Mastoid sinus clear. Bilateral cataract surgery.   Other: None   IMPRESSION: No cause for hearing loss. Negative for vestibular schwannoma or other mass in the posterior fossa.   Mild chronic microvascular ischemic change in the cerebral white matter bilaterally.   EMG (05/26/18 - [facility]): FINDINGS: NERVE CONDUCTION STUDY: Bilateral tibial and peroneal motor responses are normal. Bilateral sural and superficial peroneal sensory responses are normal. Bilateral tibial F wave latencies are normal. NEEDLE ELECTROMYOGRAPHY: Needle examination of left upper extremity and left lower extremity is unremarkable. IMPRESSION:  This is a normal study. No electrodiagnostic evidence of  large fiber neuropathy or myopathy at this time.   EMG (09/28/22): NCV & EMG Findings: Extensive electrodiagnostic evaluation of the left upper and lower limbs show: Left sural, superficial peroneal/fibular, median, ulnar, and radial sensory responses are within normal limits. Left peroneal/fibular (EDB), tibial (AH), median (APB), and ulnar (ADM) motor responses are within normal limits. Left H reflex latency is within normal limits. There is no evidence of active or chronic motor axon loss changes affecting any of the tested muscles on needle examination. Motor unit configuration and recruitment pattern is within normal limits.   Impression: This is a normal electrodiagnostic evaluation. There is no electrodiagnostic evidence of a large fiber peripheral neuropathy, myopathy, left lumbosacral (L2-S1) motor radiculopathy, or left cervical (C5-C8) motor radiculopathy.   MRI cervical spine wo contrast (12/05/22): FINDINGS: Alignment: Straightening of the normal cervical lordosis.   Vertebrae: Chronic fusion of the facets on the right  at C3-4.   Cord: See below regarding spinal stenosis with cord deformity most pronounced at C5-6. Probable early abnormal T2 signal within the cord.   Posterior Fossa, vertebral arteries, paraspinal tissues: Negative   Disc levels:   The foramen magnum is widely patent. There is ordinary mild osteoarthritis of the C1-2 articulation but no encroachment upon the neural structures.   C2-3: Mild disc bulge. Mild facet osteoarthritis. Mild left foraminal stenosis.   C3-4: Chronic facet fusion on the right. No disc pathology. No canal or foraminal stenosis.   C4-5: Endplate osteophytes and small central disc protrusion. Narrowing of the ventral subarachnoid space but no compression of cord. AP diameter of the canal in the midline 8.2 mm. Facet degeneration on the left. Mild left foraminal narrowing.   C5-6: Spondylosis with endplate osteophytes and a broad-based disc herniation more prominent to the right of midline. Spinal stenosis with AP diameter in the midline only 6.5 mm. Effacement the subarachnoid space and some deformity the cord, more on the right. Early abnormal T2 signal at this level. Mild to moderate foraminal stenosis.   C6-7: Endplate osteophytes and broad-based disc herniation with slight caudal down turning. Narrowing of the subarachnoid space with AP diameter of the canal only 6.7 mm. Mild cord deformity. Bilateral foraminal stenosis of a moderate degree.   C7-T1: Minimal disc bulge.  No canal or foraminal stenosis.   IMPRESSION: 1. Degenerative spondylosis at C5-6 with endplate osteophytes and broad-based disc herniation more prominent to the right of midline. Spinal stenosis with AP diameter of the canal only 6.5 mm. Effacement of the subarachnoid space and some deformity of the cord, more on the right. Early abnormal T2 signal within the cord at this level. Mild to moderate bilateral foraminal stenosis. Patient would be at risk of compressive myelopathy  based on these findings. 2. C6-7: Endplate osteophytes and broad-based disc herniation with slight caudal down turning. Spinal stenosis with AP diameter of the canal only 6.7 mm. Mild cord deformity. Moderate bilateral foraminal stenosis. Some risk of compressive myelopathy at this level as well. 3. C4-5: Endplate osteophytes and small central disc protrusion. AP diameter of the canal 8.2 mm. Mild left foraminal narrowing. 4. Chronic facet fusion on the right at C3-4.   Cervical spine xray (01/11/23): FINDINGS: Two intraoperative fluoroscopic views of the cervical spine. Alignment is anatomic. C5, C6, C7 anterior fusion plate is present. 10 seconds of fluoro time. Dose: 1.06 micro gray.   IMPRESSION: C5, C6, C7 anterior fusion.  Lumbar spine xray (09/20/23): IMPRESSION: 1. No fracture or acute finding. 2. Transitional vertebra and mild  degenerative changes as detailed. Stable appearance from the prior radiographs.  Assessment/Plan:  This is Terena S Jolicoeur, a 83 y.o. female with: ***   Plan: ***  Return to clinic in ***   Follow up Instructions      -I discussed the assessment and treatment plan with the patient. The patient was provided an opportunity to ask questions and all were answered. The patient agreed with the plan and demonstrated an understanding of the instructions.   The patient was advised to call back or seek an in-person evaluation if the symptoms worsen or if the condition fails to improve as anticipated.    Total time spent on today's visit was ***minutes, including both face-to-face time and nonface-to-face time.  Time included that spent on review of records (prior notes available to me/labs/imaging if pertinent), discussing treatment and goals, answering patient's questions and coordinating care.   Venetia LITTIE Potters, MD

## 2024-03-31 NOTE — Telephone Encounter (Signed)
 Left message for Office Drucie to return call

## 2024-03-31 NOTE — Telephone Encounter (Signed)
 Copied from CRM [id number]. Topic: Appointments - Appointment Cancel/Reschedule >> Mar 22, 2024  8:17 AM Laymon HERO wrote: Patient/patient representative is calling to cancel or reschedule an appointment. Refer to attachments for appointment information.   Patient Husband calling to cancel all appointments, Mrs Lookabaugh passed away this morning. Will be in contact if anything else is needed

## 2024-03-31 NOTE — Telephone Encounter (Signed)
 fyi

## 2024-03-31 NOTE — Telephone Encounter (Signed)
 Copied from CRM #[id number]. Topic: General - Other >> Mar 22, 2024  7:58 AM Aleatha BROCKS wrote: Reason for CRM: Officer Deward Mood calling for  patient behalf for a death investing to speak to Dr Cleatus Molly to see if he sign off on the death certificate, and would like a callback [phone] >> Mar 22, 2024  8:59 AM Adelita E wrote: Emergency planning/management officer Deward called back regarding this, was able to get ahold of CAL and as I was about to transfer the caller hung up.

## 2024-03-31 DEATH — deceased

## 2024-05-29 ENCOUNTER — Encounter

## 2024-06-06 NOTE — Telephone Encounter (Signed)
 error
